# Patient Record
Sex: Female | Born: 1955 | Race: White | Hispanic: No | Marital: Married | Smoking: Former smoker
Health system: Southern US, Community
[De-identification: ages and names within clinical notes are randomized; demographics above are authoritative.]

## PROBLEM LIST (undated history)

## (undated) DIAGNOSIS — E063 Autoimmune thyroiditis: Secondary | ICD-10-CM

## (undated) DIAGNOSIS — G43909 Migraine, unspecified, not intractable, without status migrainosus: Secondary | ICD-10-CM

## (undated) DIAGNOSIS — G8929 Other chronic pain: Secondary | ICD-10-CM

## (undated) DIAGNOSIS — M542 Cervicalgia: Secondary | ICD-10-CM

## (undated) DIAGNOSIS — J80 Acute respiratory distress syndrome: Secondary | ICD-10-CM

## (undated) DIAGNOSIS — I10 Essential (primary) hypertension: Secondary | ICD-10-CM

## (undated) DIAGNOSIS — Z915 Personal history of self-harm: Secondary | ICD-10-CM

## (undated) DIAGNOSIS — K219 Gastro-esophageal reflux disease without esophagitis: Secondary | ICD-10-CM

## (undated) DIAGNOSIS — E119 Type 2 diabetes mellitus without complications: Secondary | ICD-10-CM

## (undated) DIAGNOSIS — F419 Anxiety disorder, unspecified: Secondary | ICD-10-CM

## (undated) DIAGNOSIS — Z9151 Personal history of suicidal behavior: Secondary | ICD-10-CM

## (undated) DIAGNOSIS — M797 Fibromyalgia: Secondary | ICD-10-CM

## (undated) DIAGNOSIS — E039 Hypothyroidism, unspecified: Secondary | ICD-10-CM

## (undated) DIAGNOSIS — Z8709 Personal history of other diseases of the respiratory system: Secondary | ICD-10-CM

## (undated) DIAGNOSIS — I82412 Acute embolism and thrombosis of left femoral vein: Secondary | ICD-10-CM

## (undated) DIAGNOSIS — M1990 Unspecified osteoarthritis, unspecified site: Secondary | ICD-10-CM

## (undated) DIAGNOSIS — F329 Major depressive disorder, single episode, unspecified: Secondary | ICD-10-CM

## (undated) DIAGNOSIS — F32A Depression, unspecified: Secondary | ICD-10-CM

## (undated) DIAGNOSIS — M545 Low back pain, unspecified: Secondary | ICD-10-CM

## (undated) DIAGNOSIS — G40909 Epilepsy, unspecified, not intractable, without status epilepticus: Secondary | ICD-10-CM

## (undated) DIAGNOSIS — J849 Interstitial pulmonary disease, unspecified: Secondary | ICD-10-CM

## (undated) DIAGNOSIS — J9621 Acute and chronic respiratory failure with hypoxia: Secondary | ICD-10-CM

## (undated) DIAGNOSIS — J449 Chronic obstructive pulmonary disease, unspecified: Secondary | ICD-10-CM

## (undated) DIAGNOSIS — K589 Irritable bowel syndrome without diarrhea: Secondary | ICD-10-CM

## (undated) HISTORY — DX: Other chronic pain: G89.29

## (undated) HISTORY — DX: Irritable bowel syndrome without diarrhea: K58.9

## (undated) HISTORY — DX: Autoimmune thyroiditis: E06.3

## (undated) HISTORY — DX: Migraine, unspecified, not intractable, without status migrainosus: G43.909

## (undated) HISTORY — DX: Fibromyalgia: M79.7

## (undated) HISTORY — DX: Depression, unspecified: F32.A

## (undated) HISTORY — DX: Unspecified osteoarthritis, unspecified site: M19.90

## (undated) HISTORY — PX: CHOLECYSTECTOMY: SHX55

## (undated) HISTORY — DX: Low back pain: M54.5

## (undated) HISTORY — DX: Anxiety disorder, unspecified: F41.9

## (undated) HISTORY — DX: Hypothyroidism, unspecified: E03.9

## (undated) HISTORY — DX: Major depressive disorder, single episode, unspecified: F32.9

## (undated) HISTORY — DX: Cervicalgia: M54.2

## (undated) HISTORY — PX: ABDOMINAL HYSTERECTOMY: SHX81

## (undated) HISTORY — PX: OTHER SURGICAL HISTORY: SHX169

## (undated) HISTORY — DX: Low back pain, unspecified: M54.50

## (undated) HISTORY — DX: Type 2 diabetes mellitus without complications: E11.9

---

## 1991-03-20 HISTORY — PX: VESICOVAGINAL FISTULA CLOSURE W/ TAH: SUR271

## 1997-11-28 ENCOUNTER — Emergency Department (HOSPITAL_COMMUNITY): Admission: EM | Admit: 1997-11-28 | Discharge: 1997-11-28 | Payer: Self-pay | Admitting: Emergency Medicine

## 1998-02-05 ENCOUNTER — Encounter: Payer: Self-pay | Admitting: Emergency Medicine

## 1998-02-05 ENCOUNTER — Emergency Department (HOSPITAL_COMMUNITY): Admission: EM | Admit: 1998-02-05 | Discharge: 1998-02-05 | Payer: Self-pay | Admitting: Emergency Medicine

## 1998-02-07 ENCOUNTER — Ambulatory Visit (HOSPITAL_COMMUNITY): Admission: RE | Admit: 1998-02-07 | Discharge: 1998-02-07 | Payer: Self-pay | Admitting: Psychiatry

## 1998-02-07 ENCOUNTER — Encounter: Payer: Self-pay | Admitting: Psychiatry

## 1998-02-09 ENCOUNTER — Ambulatory Visit (HOSPITAL_COMMUNITY): Admission: RE | Admit: 1998-02-09 | Discharge: 1998-02-09 | Payer: Self-pay | Admitting: *Deleted

## 2001-05-13 ENCOUNTER — Encounter: Admission: RE | Admit: 2001-05-13 | Discharge: 2001-08-11 | Payer: Self-pay | Admitting: Anesthesiology

## 2001-09-05 ENCOUNTER — Encounter: Admission: RE | Admit: 2001-09-05 | Discharge: 2001-12-04 | Payer: Self-pay

## 2002-01-07 ENCOUNTER — Inpatient Hospital Stay (HOSPITAL_COMMUNITY): Admission: RE | Admit: 2002-01-07 | Discharge: 2002-01-07 | Payer: Self-pay | Admitting: Orthopaedic Surgery

## 2002-09-14 ENCOUNTER — Encounter
Admission: RE | Admit: 2002-09-14 | Discharge: 2002-12-13 | Payer: Self-pay | Admitting: Physical Medicine & Rehabilitation

## 2002-09-15 ENCOUNTER — Encounter: Payer: Self-pay | Admitting: Physical Medicine & Rehabilitation

## 2002-09-15 ENCOUNTER — Ambulatory Visit (HOSPITAL_COMMUNITY)
Admission: RE | Admit: 2002-09-15 | Discharge: 2002-09-15 | Payer: Self-pay | Admitting: Physical Medicine & Rehabilitation

## 2002-11-04 ENCOUNTER — Emergency Department (HOSPITAL_COMMUNITY): Admission: EM | Admit: 2002-11-04 | Discharge: 2002-11-05 | Payer: Self-pay | Admitting: Emergency Medicine

## 2002-11-05 ENCOUNTER — Encounter: Payer: Self-pay | Admitting: Emergency Medicine

## 2003-02-03 ENCOUNTER — Encounter
Admission: RE | Admit: 2003-02-03 | Discharge: 2003-05-04 | Payer: Self-pay | Admitting: Physical Medicine & Rehabilitation

## 2003-02-08 ENCOUNTER — Emergency Department (HOSPITAL_COMMUNITY): Admission: EM | Admit: 2003-02-08 | Discharge: 2003-02-08 | Payer: Self-pay | Admitting: Emergency Medicine

## 2003-04-30 ENCOUNTER — Ambulatory Visit (HOSPITAL_COMMUNITY)
Admission: RE | Admit: 2003-04-30 | Discharge: 2003-04-30 | Payer: Self-pay | Admitting: Physical Medicine & Rehabilitation

## 2003-05-27 ENCOUNTER — Encounter
Admission: RE | Admit: 2003-05-27 | Discharge: 2003-08-25 | Payer: Self-pay | Admitting: Physical Medicine & Rehabilitation

## 2003-07-23 ENCOUNTER — Ambulatory Visit (HOSPITAL_COMMUNITY): Admission: RE | Admit: 2003-07-23 | Discharge: 2003-07-23 | Payer: Self-pay | Admitting: Cardiovascular Disease

## 2003-07-25 ENCOUNTER — Emergency Department (HOSPITAL_COMMUNITY): Admission: EM | Admit: 2003-07-25 | Discharge: 2003-07-26 | Payer: Self-pay | Admitting: *Deleted

## 2003-08-19 ENCOUNTER — Ambulatory Visit (HOSPITAL_COMMUNITY): Admission: RE | Admit: 2003-08-19 | Discharge: 2003-08-19 | Payer: Self-pay | Admitting: Family Medicine

## 2004-12-28 ENCOUNTER — Inpatient Hospital Stay (HOSPITAL_COMMUNITY): Admission: EM | Admit: 2004-12-28 | Discharge: 2004-12-29 | Payer: Self-pay | Admitting: Emergency Medicine

## 2004-12-28 ENCOUNTER — Encounter: Payer: Self-pay | Admitting: Cardiology

## 2005-02-15 ENCOUNTER — Emergency Department (HOSPITAL_COMMUNITY): Admission: EM | Admit: 2005-02-15 | Discharge: 2005-02-16 | Payer: Self-pay | Admitting: Emergency Medicine

## 2005-03-17 ENCOUNTER — Ambulatory Visit: Admission: RE | Admit: 2005-03-17 | Discharge: 2005-03-17 | Payer: Self-pay | Admitting: Rheumatology

## 2005-03-23 ENCOUNTER — Ambulatory Visit: Payer: Self-pay | Admitting: Pulmonary Disease

## 2005-07-18 ENCOUNTER — Encounter: Payer: Self-pay | Admitting: Emergency Medicine

## 2005-08-20 ENCOUNTER — Emergency Department (HOSPITAL_COMMUNITY): Admission: EM | Admit: 2005-08-20 | Discharge: 2005-08-21 | Payer: Self-pay | Admitting: Emergency Medicine

## 2005-09-09 ENCOUNTER — Inpatient Hospital Stay (HOSPITAL_COMMUNITY): Admission: EM | Admit: 2005-09-09 | Discharge: 2005-09-14 | Payer: Self-pay | Admitting: Emergency Medicine

## 2006-02-27 ENCOUNTER — Ambulatory Visit (HOSPITAL_COMMUNITY): Admission: RE | Admit: 2006-02-27 | Discharge: 2006-02-27 | Payer: Self-pay | Admitting: Family Medicine

## 2006-05-17 ENCOUNTER — Ambulatory Visit: Payer: Self-pay | Admitting: Internal Medicine

## 2006-05-17 DIAGNOSIS — F411 Generalized anxiety disorder: Secondary | ICD-10-CM | POA: Insufficient documentation

## 2006-05-17 DIAGNOSIS — M542 Cervicalgia: Secondary | ICD-10-CM | POA: Insufficient documentation

## 2006-05-17 DIAGNOSIS — F4323 Adjustment disorder with mixed anxiety and depressed mood: Secondary | ICD-10-CM | POA: Insufficient documentation

## 2006-05-17 DIAGNOSIS — N951 Menopausal and female climacteric states: Secondary | ICD-10-CM | POA: Insufficient documentation

## 2006-05-17 DIAGNOSIS — E890 Postprocedural hypothyroidism: Secondary | ICD-10-CM | POA: Insufficient documentation

## 2006-05-17 DIAGNOSIS — J309 Allergic rhinitis, unspecified: Secondary | ICD-10-CM | POA: Insufficient documentation

## 2006-05-17 DIAGNOSIS — M1990 Unspecified osteoarthritis, unspecified site: Secondary | ICD-10-CM | POA: Insufficient documentation

## 2006-05-17 DIAGNOSIS — E039 Hypothyroidism, unspecified: Secondary | ICD-10-CM | POA: Insufficient documentation

## 2006-05-18 ENCOUNTER — Encounter (INDEPENDENT_AMBULATORY_CARE_PROVIDER_SITE_OTHER): Payer: Self-pay | Admitting: Internal Medicine

## 2006-05-20 ENCOUNTER — Encounter (INDEPENDENT_AMBULATORY_CARE_PROVIDER_SITE_OTHER): Payer: Self-pay | Admitting: Internal Medicine

## 2006-05-21 ENCOUNTER — Encounter (INDEPENDENT_AMBULATORY_CARE_PROVIDER_SITE_OTHER): Payer: Self-pay | Admitting: Family Medicine

## 2006-05-21 ENCOUNTER — Encounter (INDEPENDENT_AMBULATORY_CARE_PROVIDER_SITE_OTHER): Payer: Self-pay | Admitting: Internal Medicine

## 2006-05-27 ENCOUNTER — Telehealth (INDEPENDENT_AMBULATORY_CARE_PROVIDER_SITE_OTHER): Payer: Self-pay | Admitting: *Deleted

## 2006-05-27 ENCOUNTER — Encounter (INDEPENDENT_AMBULATORY_CARE_PROVIDER_SITE_OTHER): Payer: Self-pay | Admitting: Internal Medicine

## 2006-06-12 ENCOUNTER — Emergency Department (HOSPITAL_COMMUNITY): Admission: EM | Admit: 2006-06-12 | Discharge: 2006-06-12 | Payer: Self-pay | Admitting: Emergency Medicine

## 2006-06-13 ENCOUNTER — Telehealth (INDEPENDENT_AMBULATORY_CARE_PROVIDER_SITE_OTHER): Payer: Self-pay | Admitting: *Deleted

## 2006-06-14 ENCOUNTER — Telehealth (INDEPENDENT_AMBULATORY_CARE_PROVIDER_SITE_OTHER): Payer: Self-pay | Admitting: *Deleted

## 2006-06-17 ENCOUNTER — Telehealth (INDEPENDENT_AMBULATORY_CARE_PROVIDER_SITE_OTHER): Payer: Self-pay | Admitting: *Deleted

## 2006-06-21 ENCOUNTER — Ambulatory Visit: Payer: Self-pay | Admitting: Internal Medicine

## 2006-06-21 DIAGNOSIS — M545 Low back pain, unspecified: Secondary | ICD-10-CM | POA: Insufficient documentation

## 2006-06-27 ENCOUNTER — Telehealth (INDEPENDENT_AMBULATORY_CARE_PROVIDER_SITE_OTHER): Payer: Self-pay | Admitting: Internal Medicine

## 2006-08-13 ENCOUNTER — Telehealth (INDEPENDENT_AMBULATORY_CARE_PROVIDER_SITE_OTHER): Payer: Self-pay | Admitting: Internal Medicine

## 2006-10-21 ENCOUNTER — Ambulatory Visit (HOSPITAL_COMMUNITY): Admission: RE | Admit: 2006-10-21 | Discharge: 2006-10-21 | Payer: Self-pay | Admitting: Anesthesiology

## 2007-01-20 ENCOUNTER — Other Ambulatory Visit: Admission: RE | Admit: 2007-01-20 | Discharge: 2007-01-20 | Payer: Self-pay | Admitting: Obstetrics and Gynecology

## 2007-02-14 ENCOUNTER — Ambulatory Visit (HOSPITAL_COMMUNITY): Admission: RE | Admit: 2007-02-14 | Discharge: 2007-02-14 | Payer: Self-pay | Admitting: Obstetrics and Gynecology

## 2007-04-11 ENCOUNTER — Ambulatory Visit (HOSPITAL_COMMUNITY): Admission: RE | Admit: 2007-04-11 | Discharge: 2007-04-11 | Payer: Self-pay | Admitting: Anesthesiology

## 2007-08-14 ENCOUNTER — Emergency Department (HOSPITAL_COMMUNITY): Admission: EM | Admit: 2007-08-14 | Discharge: 2007-08-14 | Payer: Self-pay | Admitting: Emergency Medicine

## 2009-02-18 ENCOUNTER — Encounter: Payer: Self-pay | Admitting: Physician Assistant

## 2009-08-10 ENCOUNTER — Ambulatory Visit (HOSPITAL_COMMUNITY)
Admission: RE | Admit: 2009-08-10 | Discharge: 2009-08-10 | Payer: Self-pay | Source: Home / Self Care | Admitting: Endocrinology

## 2009-10-12 ENCOUNTER — Ambulatory Visit: Payer: Self-pay | Admitting: Family Medicine

## 2009-10-12 DIAGNOSIS — K589 Irritable bowel syndrome without diarrhea: Secondary | ICD-10-CM | POA: Insufficient documentation

## 2009-10-13 ENCOUNTER — Encounter: Payer: Self-pay | Admitting: Physician Assistant

## 2009-11-10 ENCOUNTER — Telehealth: Payer: Self-pay | Admitting: Physician Assistant

## 2009-11-23 ENCOUNTER — Encounter: Payer: Self-pay | Admitting: Physician Assistant

## 2009-11-28 LAB — CONVERTED CEMR LAB
ALT: 12 units/L (ref 0–35)
BUN: 12 mg/dL (ref 6–23)
CO2: 28 meq/L (ref 19–32)
Cholesterol: 251 mg/dL — ABNORMAL HIGH (ref 0–200)
Creatinine, Ser: 0.69 mg/dL (ref 0.40–1.20)
Creatinine, Urine: 108.5 mg/dL
HDL: 50 mg/dL (ref >39)
MCV: 87.7 fL (ref 78.0–100.0)
Microalb Creat Ratio: 4.6 mg/g (ref 0.0–30.0)
Microalb, Ur: 0.5 mg/dL (ref 0.00–1.89)
RBC: 4.54 M/uL (ref 3.87–5.11)
Total Bilirubin: 0.4 mg/dL (ref 0.3–1.2)
Total CHOL/HDL Ratio: 5
Triglycerides: 548 mg/dL — ABNORMAL HIGH (ref <150)
WBC: 8.3 10*3/uL (ref 4.0–10.5)

## 2009-11-29 ENCOUNTER — Telehealth: Payer: Self-pay | Admitting: Physician Assistant

## 2009-11-30 ENCOUNTER — Ambulatory Visit (HOSPITAL_COMMUNITY): Admission: RE | Admit: 2009-11-30 | Discharge: 2009-11-30 | Payer: Self-pay | Admitting: Family Medicine

## 2010-01-11 ENCOUNTER — Encounter: Payer: Self-pay | Admitting: Physician Assistant

## 2010-01-30 ENCOUNTER — Ambulatory Visit: Payer: Self-pay | Admitting: Family Medicine

## 2010-01-30 ENCOUNTER — Inpatient Hospital Stay (HOSPITAL_COMMUNITY)
Admission: EM | Admit: 2010-01-30 | Discharge: 2010-02-04 | Payer: Self-pay | Source: Home / Self Care | Admitting: Emergency Medicine

## 2010-02-05 DIAGNOSIS — E109 Type 1 diabetes mellitus without complications: Secondary | ICD-10-CM | POA: Insufficient documentation

## 2010-02-05 DIAGNOSIS — J449 Chronic obstructive pulmonary disease, unspecified: Secondary | ICD-10-CM | POA: Insufficient documentation

## 2010-02-05 DIAGNOSIS — E785 Hyperlipidemia, unspecified: Secondary | ICD-10-CM | POA: Insufficient documentation

## 2010-02-05 DIAGNOSIS — J4489 Other specified chronic obstructive pulmonary disease: Secondary | ICD-10-CM | POA: Insufficient documentation

## 2010-02-07 ENCOUNTER — Telehealth: Payer: Self-pay | Admitting: Family Medicine

## 2010-02-07 ENCOUNTER — Ambulatory Visit: Payer: Self-pay | Admitting: Family Medicine

## 2010-02-08 ENCOUNTER — Encounter: Payer: Self-pay | Admitting: Family Medicine

## 2010-02-08 ENCOUNTER — Telehealth (INDEPENDENT_AMBULATORY_CARE_PROVIDER_SITE_OTHER): Payer: Self-pay | Admitting: *Deleted

## 2010-02-08 ENCOUNTER — Telehealth: Payer: Self-pay | Admitting: Family Medicine

## 2010-02-13 ENCOUNTER — Telehealth: Payer: Self-pay | Admitting: Family Medicine

## 2010-02-27 ENCOUNTER — Telehealth: Payer: Self-pay | Admitting: Family Medicine

## 2010-03-01 ENCOUNTER — Telehealth (INDEPENDENT_AMBULATORY_CARE_PROVIDER_SITE_OTHER): Payer: Self-pay | Admitting: *Deleted

## 2010-03-02 ENCOUNTER — Ambulatory Visit: Payer: Self-pay | Admitting: Family Medicine

## 2010-03-02 DIAGNOSIS — J209 Acute bronchitis, unspecified: Secondary | ICD-10-CM | POA: Insufficient documentation

## 2010-03-02 DIAGNOSIS — R110 Nausea: Secondary | ICD-10-CM | POA: Insufficient documentation

## 2010-03-02 LAB — CONVERTED CEMR LAB
Basophils Absolute: 0.1 10*3/uL (ref 0.0–0.1)
Calcium: 9.3 mg/dL (ref 8.4–10.5)
Creatinine, Ser: 0.68 mg/dL (ref 0.40–1.20)
Eosinophils Absolute: 0.3 10*3/uL (ref 0.0–0.7)
Eosinophils Relative: 4 % (ref 0–5)
HCT: 38.7 % (ref 36.0–46.0)
Helicobacter Pylori Antibody-IgG: <0.4
Hemoglobin: 12.8 g/dL (ref 12.0–15.0)
Hgb A1c MFr Bld: 5.7 % — ABNORMAL HIGH (ref <5.7)
MCHC: 33.1 g/dL (ref 30.0–36.0)
MCV: 87.2 fL (ref 78.0–100.0)
Monocytes Absolute: 0.5 10*3/uL (ref 0.1–1.0)
Platelets: 311 10*3/uL (ref 150–400)
RDW: 13.7 % (ref 11.5–15.5)
Sodium: 138 meq/L (ref 135–145)
TSH: 0.353 microintl units/mL (ref 0.350–4.500)

## 2010-03-03 ENCOUNTER — Telehealth: Payer: Self-pay | Admitting: Family Medicine

## 2010-03-03 ENCOUNTER — Encounter: Payer: Self-pay | Admitting: Family Medicine

## 2010-03-10 ENCOUNTER — Encounter: Payer: Self-pay | Admitting: Family Medicine

## 2010-03-28 ENCOUNTER — Encounter: Payer: Self-pay | Admitting: Family Medicine

## 2010-04-09 ENCOUNTER — Encounter: Payer: Self-pay | Admitting: Obstetrics and Gynecology

## 2010-04-09 ENCOUNTER — Encounter: Payer: Self-pay | Admitting: Family Medicine

## 2010-04-09 ENCOUNTER — Encounter: Payer: Self-pay | Admitting: Internal Medicine

## 2010-04-10 ENCOUNTER — Ambulatory Visit (HOSPITAL_COMMUNITY)
Admission: RE | Admit: 2010-04-10 | Discharge: 2010-04-10 | Payer: Self-pay | Source: Home / Self Care | Attending: Pulmonary Disease | Admitting: Pulmonary Disease

## 2010-04-16 LAB — CONVERTED CEMR LAB
Albumin: 3.9 g/dL (ref 3.5–5.2)
Albumin: 4.2 g/dL
Alkaline Phosphatase: 102 units/L (ref 39–117)
Alkaline Phosphatase: 98 units/L
BUN: 13 mg/dL
Basophils Absolute: 0 10*3/uL
Bilirubin Urine: NEGATIVE
Blood in Urine, dipstick: NEGATIVE
CO2: 26 meq/L (ref 19–32)
Calcium: 9.9 mg/dL
Creatinine, Ser: 1 mg/dL
Eosinophils Absolute: 0.2 10*3/uL
Eosinophils Absolute: 0.7 10*3/uL
Eosinophils Relative: 2 %
Free T4: 1.69 ng/dL (ref 0.89–1.80)
Glucose, Bld: 106 mg/dL
Glucose, Bld: 71 mg/dL (ref 70–99)
Glucose, Urine, Semiquant: NEGATIVE
HCT: 38.4 %
HCT: 40.3 %
Hemoglobin: 13.5 g/dL
Hgb A1c MFr Bld: 6 %
Ketones, urine, test strip: NEGATIVE
Lymphocytes Relative: 37 % (ref 12–46)
Lymphs Abs: 3.7 10*3/uL — ABNORMAL HIGH (ref 0.7–3.3)
Lymphs Abs: 4.2 10*3/uL
MCV: 30 fL
MCV: 87.3 fL
Monocytes Absolute: 0.7 10*3/uL
Monocytes Relative: 5 %
Neutrophils Relative %: 48 % (ref 43–77)
Neutrophils Relative %: 56 %
Nitrite: NEGATIVE
Platelets: 253 10*3/uL (ref 150–400)
Platelets: 283 10*3/uL
Potassium: 4.3 meq/L
Potassium: 4.4 meq/L (ref 3.5–5.3)
RBC: 4.48 M/uL
RDW: 13.1 %
Sodium: 141 meq/L (ref 135–145)
Specific Gravity, Urine: 1.025
T4, Total: 3.8 ug/dL
TSH: 0.055 microintl units/mL
Total Protein: 6.8 g/dL (ref 6.0–8.3)
WBC: 12.9 10*3/uL
WBC: 9.8 10*3/uL (ref 4.0–10.5)
pH: 6

## 2010-04-18 NOTE — Progress Notes (Signed)
Summary: nausea and vom  Phone Note Call from Patient   Summary of Call: wants to know will you call in something for her nausea and vom, sick to her stomach wants phergen [facility] Initial call taken by: Lind Guest,  February 08, 2010 11:29 AM  Follow-up for Phone Call        pls advisept med requested has been sent in Follow-up by: Syliva Overman MD,  February 08, 2010 1:02 PM  Additional Follow-up for Phone Call Additional follow up Details #1::        patient is aware Additional Follow-up by: Lind Guest,  February 08, 2010 1:08 PM    New/Updated Medications: PROMETHAZINE HCL 12.5 MG TABS (PROMETHAZINE HCL) Take 1 tablet by mouth two times a day as needed for nausea and vomitting Prescriptions: PROMETHAZINE HCL 12.5 MG TABS (PROMETHAZINE HCL) Take 1 tablet by mouth two times a day as needed for nausea and vomitting  #20 x 0   Entered and Authorized by:   Syliva Overman MD   Signed by:   Syliva Overman MD on 02/08/2010   Method used:   Electronically to        [facility]* (retail)       [address]       [place], [place]  [address]       Ph: [phone]       Fax: [phone]   RxID:   [id number]

## 2010-04-18 NOTE — Assessment & Plan Note (Signed)
Summary: office visit   Vital Signs:  Patient profile:   55 year old female Menstrual status:  hysterectomy Height:      64.5 inches Weight:      213 pounds BMI:     36.13 O2 Sat:      97 % on Room air Pulse rate:   82 / minute Pulse rhythm:   regular Resp:     16 per minute BP sitting:   108 / 70  (right arm)  Vitals Entered By: Mauricia Area CMA (January 30, 2010 3:58 PM)  Nutrition Counseling: Patient's BMI is greater than 25 and therefore counseled on weight management options.  O2 Flow:  Room air CC: Coughing, wheezing.   Primary Care Provider:  Syliva Overman MD  CC:  Coughing and wheezing.Marland Kitchen  History of Present Illness: Pt presnts with  4 day h/o progressive and disabling cough with wheeze and sputum production, fever and chills. she has been unable to sleep because of severity of her symptoms. she is fatigued, despite a neb treatment in the office she is clearly having difficuly breathing as well as speaking.   Allergies (verified): 1)  ! * Surgical Tape 2)  ! * Latex 3)  Pcn 4)  Keflex 5)  Reglan  Review of Systems      See HPI General:  Complains of chills, fatigue, fever, malaise, sleep disorder, sweats, and weakness; 4 day h/o acute illness. Eyes:  Denies red eye. Resp:  Complains of cough, sputum productive, and wheezing; 4 day h/o worsening symptoms. MS:  Complains of joint pain, low back pain, mid back pain, and stiffness. Psych:  Complains of anxiety and depression. Endo:  Denies cold intolerance, excessive hunger, excessive thirst, and excessive urination. Heme:  Denies abnormal bruising and bleeding.   Impression & Recommendations:  Problem # 1:  COPD (ICD-496) Assessment Deteriorated neb treatment and ed eval for admission  Problem # 2:  BACK PAIN, LUMBAR (ICD-724.2) Assessment: Unchanged  Her updated medication list for this problem includes:    Aspir-low 81 Mg Tbec (Aspirin) ..... One tab by mouth once daily    Hydromorphone Hcl 4  Mg Tabs (Hydromorphone hcl) ..... One tab by mouth every four hours as needed, no more than 5 a day    Fentanyl 100 Mcg/hr Pt72 (Fentanyl) ..... One patch every 2 days  Problem # 3:  HYPOTHYROIDISM (ICD-244.9) Assessment: Comment Only  Her updated medication list for this problem includes:    Levothyroxine Sodium 200 Mcg Tabs (Levothyroxine sodium) ..... One tab by mouth once daily  Labs Reviewed: TSH: 0.114 (05/17/2006)   Free T4: 1.69 (05/17/2006)    HgBA1c: 5.5 (11/23/2009) Chol: 251 (11/23/2009)   HDL: 50 (11/23/2009)   LDL: See Comment mg/dL (70/62/3762)   TG: 831 (11/23/2009)  Problem # 4:  IDDM (ICD-250.01) Assessment: Comment Only  Her updated medication list for this problem includes:    Aspir-low 81 Mg Tbec (Aspirin) ..... One tab by mouth once daily  Labs Reviewed: Creat: 0.69 (11/23/2009)    Reviewed HgBA1c results: 5.5 (11/23/2009)  6.0 (07/23/2005)  Problem # 5:  DEPRESSION (ICD-311) Assessment: Comment Only  Her updated medication list for this problem includes:    Alprazolam 1 Mg Tabs (Alprazolam) ..... One tab by mouth three times a day    Trazodone Hcl 150 Mg Tabs (Trazodone hcl) ..... One tab by mouth at bedtime    Paroxetine Hcl 10 Mg Tabs (Paroxetine hcl) ..... One tab by mouth once daily  Complete Medication List:  1)  Levothyroxine Sodium 200 Mcg Tabs (Levothyroxine sodium) .... One tab by mouth once daily 2)  Aspir-low 81 Mg Tbec (Aspirin) .... One tab by mouth once daily 3)  Alprazolam 1 Mg Tabs (Alprazolam) .... One tab by mouth three times a day 4)  Hydromorphone Hcl 4 Mg Tabs (Hydromorphone hcl) .... One tab by mouth every four hours as needed, no more than 5 a day 5)  Trazodone Hcl 150 Mg Tabs (Trazodone hcl) .... One tab by mouth at bedtime 6)  Paroxetine Hcl 10 Mg Tabs (Paroxetine hcl) .... One tab by mouth once daily 7)  Fentanyl 100 Mcg/hr Pt72 (Fentanyl) .... One patch every 2 days 8)  Polyethylene Glycol 1450 Liqd (Polyethylene glycol 1450)  .... Use 17 grams in 4-8 oz of fluid once daily 9)  Fenofibrate Micronized 134 Mg Caps (Fenofibrate micronized) .... Take 1 daily  Other Orders: Albuterol Sulfate Sol 1mg  unit dose (E4540) Ipratropium inhalation sol. unit dose (J8119) Nebulizer Tx (14782)  Patient Instructions: 1)  Please schedule a follow-up appointment in 2 weeks. 2)  You are having bronchitis/pneumionia, amnd copd flare , pls go directly to the Ed   Medication Administration  Medication # 1:    Medication: Albuterol Sulfate Sol 1mg  unit dose    Diagnosis: COUGH (ICD-786.2)    Dose: 2.5mg     Route: 08/12inhaled    Exp Date: 08/12    Lot #: [id number]    Mfr: nephron pharm    Patient tolerated medication without complications    Given by: Adella Hare LPN (January 31, 2010 8:17 AM)  Medication # 2:    Medication: Ipratropium inhalation sol. unit dose    Diagnosis: COUGH (ICD-786.2)    Dose: 0.5mg     Route: inhaled    Exp Date: 10/12    Lot #: [id number]    Mfr: nephron pharm    Patient tolerated medication without complications    Given by: Adella Hare LPN (January 31, 2010 8:18 AM)  Orders Added: 1)  Albuterol Sulfate Sol 1mg  unit dose [I6962] 2)  Ipratropium inhalation sol. unit dose [J7644] 3)  Nebulizer Tx [94640] 4)  Est. Patient Level IV [95284]     Medication Administration  Medication # 1:    Medication: Albuterol Sulfate Sol 1mg  unit dose    Diagnosis: COUGH (ICD-786.2)    Dose: 2.5mg     Route: 08/12inhaled    Exp Date: 08/12    Lot #: [id number]    Mfr: nephron pharm    Patient tolerated medication without complications    Given by: Adella Hare LPN (January 31, 2010 8:17 AM)  Medication # 2:    Medication: Ipratropium inhalation sol. unit dose    Diagnosis: COUGH (ICD-786.2)    Dose: 0.5mg     Route: inhaled    Exp Date: 10/12    Lot #: [id number]    Mfr: nephron pharm    Patient tolerated medication without complications    Given by: Adella Hare LPN (January 31, 2010 8:18  AM)  Orders Added: 1)  Albuterol Sulfate Sol 1mg  unit dose [D6644] 2)  Ipratropium inhalation sol. unit dose [J7644] 3)  Nebulizer Tx [94640] 4)  Est. Patient Level IV [03474]

## 2010-04-18 NOTE — Progress Notes (Signed)
Summary: please advise  Phone Note Other Incoming   Summary of Call: [facility]'t [facility] patient's insurance she states we could try [facility] for overnight oximetry.   Initial call taken by: Curtis Sites,  February 13, 2010 4:25 PM  Follow-up for Phone Call        ok to do overnight oximetry with company her insurance uses, pls fax the script /copy of the order script to [facility] Follow-up by: Syliva Overman MD,  February 13, 2010 5:09 PM  Additional Follow-up for Phone Call Additional follow up Details #1::        order faxed to [facility]  Additional Follow-up by: Adella Hare LPN,  February 14, 2010 5:06 PM

## 2010-04-18 NOTE — Assessment & Plan Note (Signed)
Summary: F UP FROM HOSPITAL   Vital Signs:  Patient profile:   55 year old female Menstrual status:  hysterectomy Height:      64.5 inches Weight:      217.50 pounds BMI:     36.89 O2 Sat:      97 % on Room air Pulse rate:   84 / minute Pulse rhythm:   regular Resp:     16 per minute BP sitting:   110 / 70  (left arm)  Vitals Entered By: Adella Hare LPN (February 07, 2010 1:08 PM)  Nutrition Counseling: Patient's BMI is greater than 25 and therefore counseled on weight management options.  O2 Flow:  Room air CC: hospital follow up Is Patient Diabetic? Yes Did you bring your meter with you today? No   Primary Care S[facility]alth[facility]:  Syliva Overman MD  CC:  hospital follow up.  History of Present Illness: pt reports that since her d/c she has had recurrent episodes of difficlty breathing as recently as last night and at times she feels as though her throat is closing up. hospitalised [facility] or [facility] times at [facility] with pneumonia, pt states in 2009 she spent 4 weeks in [facility] and [place], for 2 weeks, was intubated. She does admit to some anxiety surrounding her breathing. She has been in mental health for years, and repts  that her codition currently is stable and unchanged. She denies urinary symptoms. She denies productive cough or sputum, she ha no nasal congestion, sinus pressure or post nasal drainage.   Current Medications (verified): 1)  Levothyroxine Sodium 200 Mcg Tabs (Levothyroxine Sodium) .... One Tab By Mouth Once Daily 2)  Aspir-Low 81 Mg Tbec (Aspirin) .... One Tab By Mouth Once Daily 3)  Alprazolam 1 Mg Tabs (Alprazolam) .... One Tab By Mouth Three Times A Day 4)  Trazodone Hcl 150 Mg Tabs (Trazodone Hcl) .... One Tab By Mouth At Bedtime 5)  Paroxetine Hcl 10 Mg Tabs (Paroxetine Hcl) .... One Tab By Mouth Once Daily 6)  Polyethylene Glycol 1450  Liqd (Polyethylene Glycol 1450) .... Use 17 Grams in 4-8 Oz of Fluid Once Daily 7)  Fenofibrate Micronized 134 Mg Caps  (Fenofibrate Micronized) .... Take 1 Daily 8)  Flovent Hfa 44 Mcg/act Aero (Fluticasone Propionate  Hfa) .... Two Puffs Two Times A Day 9)  Humalog 100 Unit/ml Soln (Insulin Lispro (Human)) .... Per Sliding Scale Three Times A Day With Meals 10)  Lantus 100 Unit/ml Soln (Insulin Glargine) .Marland Kitchen.. 10 Units Subcutaneously At Bedtime 11)  Combivent 18-103 Mcg/act Aero (Ipratropium-Albuterol) .... Two Puffs Inhaled Three Times A Day 12)  Hydromorphone Hcl 4 Mg Tabs (Hydromorphone Hcl) .Marland Kitchen.. 1-2 Tabs By Mouth Every 6 Hours As Needed 13)  Ms Contin 100 Mg Xr12h-Tab (Morphine Sulfate) .... One Tab By Mouth Every Eight Hours  Allergies (verified): 1)  ! * Surgical Tape 2)  ! * Latex 3)  Pcn 4)  Keflex 5)  Reglan  Review of Systems      See HPI General:  Complains of fatigue. Eyes:  Denies blurring, discharge, eye pain, and red eye. CV:  Denies palpitations and swelling of feet. GI:  Denies abdominal pain, constipation, diarrhea, nausea, and vomiting. GU:  Denies dysuria and urinary frequency. MS:  Complains of low back pain and mid back pain. Derm:  Denies itching and rash. Neuro:  Denies headaches and seizures. Psych:  Complains of anxiety, depression, and mental problems; denies suicidal thoughts/plans, thoughts of violence, and unusual visions  or sounds. Endo:  Denies excessive thirst and excessive urination. Heme:  Denies abnormal bruising and bleeding. Allergy:  Denies hives or rash and itching eyes.  Physical Exam  General:  Well-developed,obese,in no acute distress; alert,appropriate and cooperative throughout examination HEENT: No facial asymmetry,  EOMI, No sinus tenderness, TM's Clear, oropharynx  pink and moist.   Chest: decreased air entry few wheezes no crackles CVS: S1, S2, No murmurs, No S3.   Abd: Soft, Nontender.  MS: decreased  ROM spine,adequate in hips, shoulders and knees.  Ext: No edema.   CNS: CN 2-12 intact, power tone and sensation normal throughout.   Skin:  Intact, no visible lesions or rashes.  Psych: Good eye contact, normal affect.  Memory intact,  anxious oppearing.    Impression & Recommendations:  Problem # 1:  COPD (ICD-496) Assessment Deteriorated  Her updated medication list for this problem includes:    Flovent Hfa 44 Mcg/act Aero (Fluticasone propionate  hfa) .Marland Kitchen..Marland Kitchen Two puffs two times a day    Combivent 18-103 Mcg/act Aero (Ipratropium-albuterol) .Marland Kitchen..Marland Kitchen Two puffs inhaled three times a day    Spiriva Handihaler 18 Mcg Caps (Tiotropium bromide monohydrate) ..... One inhalation once daily    Symbicort 80-4.5 Mcg/act Aero (Budesonide-formoterol fumarate) .Marland Kitchen... 2 puffs twice daily  Orders: Pulmonary Referral (Pulmonary)  Problem # 2:  IDDM (ICD-250.01) Assessment: Comment Only  Her updated medication list for this problem includes:    Aspir-low 81 Mg Tbec (Aspirin) ..... One tab by mouth once daily    Humalog 100 Unit/ml Soln (Insulin lispro (human)) .Marland Kitchen... Per sliding scale three times a day with meals    Lantus 100 Unit/ml Soln (Insulin glargine) .Marland KitchenMarland KitchenMarland KitchenMarland Kitchen 10 units subcutaneously at bedtime  Orders: T- Hemoglobin A1C ([address]-60[address])  Labs Reviewed: Creat: 0.69 (11/23/2009)    Reviewed HgBA1c results: 5.5 (11/23/2009)  6.0 (07/23/2005)  Problem # 3:  BACK PAIN, LUMBAR (ICD-724.2) Assessment: Unchanged  The following medications were removed from the medication list:    Hydromorphone Hcl 4 Mg Tabs (Hydromorphone hcl) ..... One tab by mouth every four hours as needed, no more than 5 a day    Fentanyl 100 Mcg/hr Pt72 (Fentanyl) ..... One patch every 2 days Her updated medication list for this problem includes:    Aspir-low 81 Mg Tbec (Aspirin) ..... One tab by mouth once daily    Hydromorphone Hcl 4 Mg Tabs (Hydromorphone hcl) .Marland Kitchen... 1-2 tabs by mouth every 6 hours as needed    Ms Contin 100 Mg Xr12h-tab (Morphine sulfate) ..... One tab by mouth every eight hours treated hrough pain clinic  Problem # 4:  DEPRESSION  (ICD-311) Assessment: Unchanged  Her updated medication list for this problem includes:    Alprazolam 1 Mg Tabs (Alprazolam) ..... One tab by mouth three times a day    Trazodone Hcl 150 Mg Tabs (Trazodone hcl) ..... One tab by mouth at bedtime    Paroxetine Hcl 10 Mg Tabs (Paroxetine hcl) ..... One tab by mouth once daily followed by mentalhealth for yrs  Problem # 5:  HYPOTHYROIDISM (ICD-244.9) Assessment: Comment Only  Her updated medication list for this problem includes:    Levothyroxine Sodium 200 Mcg Tabs (Levothyroxine sodium) ..... One tab by mouth once daily  Labs Reviewed: TSH: 0.114 (05/17/2006)   Free T4: 1.69 (05/17/2006)  , recently hospitlalised will track down updated values HgBA1c: 5.5 (11/23/2009) Chol: 251 (11/23/2009)   HDL: 50 (11/23/2009)   LDL: See Comment mg/dL (09/81/1914)   TG: 782 (11/23/2009)  Complete Medication List:  1)  Levothyroxine Sodium 200 Mcg Tabs (Levothyroxine sodium) .... One tab by mouth once daily 2)  Aspir-low 81 Mg Tbec (Aspirin) .... One tab by mouth once daily 3)  Alprazolam 1 Mg Tabs (Alprazolam) .... One tab by mouth three times a day 4)  Trazodone Hcl 150 Mg Tabs (Trazodone hcl) .... One tab by mouth at bedtime 5)  Paroxetine Hcl 10 Mg Tabs (Paroxetine hcl) .... One tab by mouth once daily 6)  Polyethylene Glycol 1450 Liqd (Polyethylene glycol 1450) .... Use 17 grams in 4-8 oz of fluid once daily 7)  Fenofibrate Micronized 134 Mg Caps (Fenofibrate micronized) .... Take 1 daily 8)  Flovent Hfa 44 Mcg/act Aero (Fluticasone propionate  hfa) .... Two puffs two times a day 9)  Humalog 100 Unit/ml Soln (Insulin lispro (human)) .... Per sliding scale three times a day with meals 10)  Lantus 100 Unit/ml Soln (Insulin glargine) .Marland Kitchen.. 10 units subcutaneously at bedtime 11)  Combivent 18-103 Mcg/act Aero (Ipratropium-albuterol) .... Two puffs inhaled three times a day 12)  Hydromorphone Hcl 4 Mg Tabs (Hydromorphone hcl) .Marland Kitchen.. 1-2 tabs by mouth  every 6 hours as needed 13)  Ms Contin 100 Mg Xr12h-tab (Morphine sulfate) .... One tab by mouth every eight hours 14)  Spiriva Handihaler 18 Mcg Caps (Tiotropium bromide monohydrate) .... One inhalation once daily 15)  Symbicort 80-4.5 Mcg/act Aero (Budesonide-formoterol fumarate) .... 2 puffs twice daily 16)  Fluconazole 150 Mg Tabs (Fluconazole) .... Take 1 tablet by mouth once a day as needed for vaginal itch 17)  Promethazine Hcl 12.5 Mg Tabs (Promethazine hcl) .... Take 1 tablet by mouth two times a day as needed for nausea and vomitting  Other Orders: T-TSH ([id number]) T-T3, Free [phone]) T-T4, Free [phone])  Patient Instructions: 1)  Please schedule a follow-up appointment in 3 months. 2)  you will be referred to Dr Juanetta Gosling. 3)  new inhalers to help with breathing for daily use, we will provide symbicort sample and coupon. 4)  you will be referredfor over night pulse oximetry to see ifyou need oxygen when asleep. 5)  tsH , free T3and T4, also HBA1C in 3 months Prescriptions: SYMBICORT 80-4.5 MCG/ACT AERO (BUDESONIDE-FORMOTEROL FUMARATE) 2 puffs twice daily  #1 x 3   Entered and Authorized by:   Syliva Overman MD   Signed by:   Syliva Overman MD on 02/07/2010   Method used:   Electronically to        [facility]* (retail)       [address]       [place], [place]  [address]       Ph: [phone]       Fax: [phone]   RxID:   [id number] SPIRIVA HANDIHALER 18 MCG CAPS (TIOTROPIUM BROMIDE MONOHYDRATE) one inhalation once daily  #30 caps x 11   Entered and Authorized by:   Syliva Overman MD   Signed by:   Syliva Overman MD on 02/07/2010   Method used:   Electronically to        [facility]* (retail)       [address]       [place], [place]  [address]       Ph: [phone]       Fax: [phone]   RxID:   [id number]    Orders Added: 1)  Est. Patient Level IV [81829] 2)  T-TSH [93716-96789] 3)  T-T3,  Free [04540-98119] 4)  T-T4, Free [14782-95621] 5)  T- Hemoglobin A1C [83036-23375] 6)  Pulmonary Referral [Pulmonary]

## 2010-04-18 NOTE — Progress Notes (Signed)
Summary: med  Phone Note Call from Patient   Summary of Call: pt went to get med but they could afford the generic brand.  can you please call her. [phone] Initial call taken by: Rudene Anda,  November 29, 2009 2:51 PM  Follow-up for Phone Call        patient states she cannot afford the tricor, even generic is too high Follow-up by: Adella Hare LPN,  November 29, 2009 3:24 PM  Additional Follow-up for Phone Call Additional follow up Details #1::        Doesnt she have prescription coverage?  What is her copay for generic meds? Additional Follow-up by: Esperanza Sheets PA,  November 29, 2009 3:30 PM    Additional Follow-up for Phone Call Additional follow up Details #2::    returned call, left message Follow-up by: Adella Hare LPN,  November 30, 2009 9:08 AM  Additional Follow-up for Phone Call Additional follow up Details #3:: Details for Additional Follow-up Action Taken: please send in the similar drug that is generic Additional Follow-up by: Adella Hare LPN,  November 30, 2009 10:35 AM  New/Updated Medications: FENOFIBRATE MICRONIZED 134 MG CAPS (FENOFIBRATE MICRONIZED) take 1 daily Prescriptions: FENOFIBRATE MICRONIZED 134 MG CAPS (FENOFIBRATE MICRONIZED) take 1 daily  #30 x 3   Entered and Authorized by:   Esperanza Sheets PA   Signed by:   Esperanza Sheets PA on 11/30/2009   Method used:   Electronically to        [facility]* (retail)       [address]       [place], [place]  [address]       Ph: [phone]       Fax: [phone]   RxID:   [id number]

## 2010-04-18 NOTE — Letter (Signed)
Summary: AUTHORIZATION FOR MEDICAL RELEASE  AUTHORIZATION FOR MEDICAL RELEASE   Imported By: Lind Guest 10/13/2009 16:46:52  _____________________________________________________________________  External Attachment:    Type:   Image     Comment:   External Document

## 2010-04-18 NOTE — Progress Notes (Signed)
  Phone Note Outgoing Call   Summary of Call: Notify pt that I have received and reviewed her records from Dr Olena Leatherwood.  I would like her to get the labs drawn that I ordered last mos so that we can address her diabetes. Initial call taken by: Esperanza Sheets PA,  November 10, 2009 9:41 AM  Follow-up for Phone Call        called patient, left message  Follow-up by: Everitt Amber LPN,  November 10, 2009 10:05 AM  Additional Follow-up for Phone Call Additional follow up Details #1::        Patient aware and will have done Additional Follow-up by: Everitt Amber LPN,  November 11, 2009 3:51 PM
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
## 2010-04-18 NOTE — Miscellaneous (Signed)
Summary: REFUSED HOME CARE  REFUSED HOME CARE   Imported By: Lind Guest 02/07/2010 08:32:11  _____________________________________________________________________  External Attachment:    Type:   Image     Comment:   External Document

## 2010-04-18 NOTE — Progress Notes (Signed)
Summary: resend rx  Phone Note Call from Patient   Summary of Call: please send to rx [facility] thank you Initial call taken by: Lind Guest,  February 08, 2010 1:30 PM  Follow-up for Phone Call        pls print and faxt to pharmacy requested, phenergan Follow-up by: Syliva Overman MD,  February 08, 2010 2:14 PM    Prescriptions: PROMETHAZINE HCL 12.5 MG TABS (PROMETHAZINE HCL) Take 1 tablet by mouth two times a day as needed for nausea and vomitting  #20 x 0   Entered by:   Adella Hare LPN   Authorized by:   Syliva Overman MD   Signed by:   Adella Hare LPN on 16/12/9602   Method used:   Electronically to        [facility]* (retail)       [address]       [place], [place]  [address]       Ph: [phone]       Fax: [phone]   RxID:   [id number]

## 2010-04-18 NOTE — Letter (Signed)
Summary: overnight oximetry  overnight oximetry   Imported By: Lind Guest 02/08/2010 17:32:53  _____________________________________________________________________  External Attachment:    Type:   Image     Comment:   External Document

## 2010-04-18 NOTE — Letter (Signed)
Summary: medical release info  medical release info   Imported By: Lind Guest 02/08/2010 17:24:58  _____________________________________________________________________  External Attachment:    Type:   Image     Comment:   External Document

## 2010-04-18 NOTE — Progress Notes (Signed)
  Phone Note Call from Patient   Caller: Patient Summary of Call: forgot to tell dr simpson she has yeast infection from the antibiotics, please send somthing in Initial call taken by: Adella Hare LPN,  February 07, 2010 2:05 PM  Follow-up for Phone Call        pls let pt know med requested sent in Follow-up by: Syliva Overman MD,  February 07, 2010 2:47 PM  Additional Follow-up for Phone Call Additional follow up Details #1::        left messaGE Additional Follow-up by: Lind Guest,  February 07, 2010 4:26 PM    New/Updated Medications: FLUCONAZOLE 150 MG TABS (FLUCONAZOLE) Take 1 tablet by mouth once a day as needed for vaginal itch Prescriptions: FLUCONAZOLE 150 MG TABS (FLUCONAZOLE) Take 1 tablet by mouth once a day as needed for vaginal itch  #3 x 0   Entered and Authorized by:   Syliva Overman MD   Signed by:   Syliva Overman MD on 02/07/2010   Method used:   Electronically to        [facility]* (retail)       [address]       [place], [place]  [address]       Ph: [phone]       Fax: [phone]   RxID:   [id number]

## 2010-04-18 NOTE — Procedures (Signed)
Summary: Gastroenterology / letter  Gastroenterology / letter   Imported By: Lind Guest 01/11/2010 14:38:52  _____________________________________________________________________  External Attachment:    Type:   Image     Comment:   External Document

## 2010-04-20 NOTE — Assessment & Plan Note (Signed)
Summary: ov   Vital Signs:  Patient profile:   55 year old female Menstrual status:  hysterectomy Height:      64.5 inches Weight:      210.25 pounds O2 Sat:      98 % on Room air Pulse rate:   92 / minute Pulse rhythm:   regular Resp:     16 per minute BP sitting:   110 / 70  (right arm)  Vitals Entered By: Adella Hare LPN (March 02, 2010 3:13 PM)  O2 Flow:  Room air CC: follow-up visit- still sick Is Patient Diabetic? Yes Did you bring your meter with you today? No   Primary Care Kyndall Chaplin:  Syliva Overman MD  CC:  follow-up visit- still sick.  History of Present Illness: 1 week h/o increase4d sOB , wheeze thick grey sputum, chills, and no fever, bones aching. Passed out 3 daYS AGO. She also c/o increased abdominal pain with uncontrolled nausea and vomitting.has been to gI in the remote past want re-eval.   Current Medications (verified): 1)  Levothyroxine Sodium 200 Mcg Tabs (Levothyroxine Sodium) .... One Tab By Mouth Once Daily 2)  Aspir-Low 81 Mg Tbec (Aspirin) .... One Tab By Mouth Once Daily 3)  Alprazolam 1 Mg Tabs (Alprazolam) .... One Tab By Mouth Three Times A Day 4)  Trazodone Hcl 150 Mg Tabs (Trazodone Hcl) .... One Tab By Mouth At Bedtime 5)  Polyethylene Glycol 1450  Liqd (Polyethylene Glycol 1450) .... Use 17 Grams in 4-8 Oz of Fluid Once Daily 6)  Fenofibrate Micronized 134 Mg Caps (Fenofibrate Micronized) .... Take 1 Daily 7)  Flovent Hfa 44 Mcg/act Aero (Fluticasone Propionate  Hfa) .... Two Puffs Two Times A Day 8)  Humalog 100 Unit/ml Soln (Insulin Lispro (Human)) .... Per Sliding Scale Three Times A Day With Meals 9)  Lantus 100 Unit/ml Soln (Insulin Glargine) .Marland Kitchen.. 10 Units Subcutaneously At Bedtime 10)  Combivent 18-103 Mcg/act Aero (Ipratropium-Albuterol) .... Two Puffs Inhaled Three Times A Day 11)  Hydromorphone Hcl 4 Mg Tabs (Hydromorphone Hcl) .Marland Kitchen.. 1-2 Tabs By Mouth Every 6 Hours As Needed 12)  Ms Contin 100 Mg Xr12h-Tab (Morphine Sulfate)  .... One Tab By Mouth Every 12  Hours 13)  Spiriva Handihaler 18 Mcg Caps (Tiotropium Bromide Monohydrate) .... One Inhalation Once Daily 14)  Symbicort 80-4.5 Mcg/act Aero (Budesonide-Formoterol Fumarate) .... 2 Puffs Twice Daily 15)  Paroxetine Hcl 20 Mg Tabs (Paroxetine Hcl) .... One Tab By Mouth Once Daily  Allergies (verified): 1)  ! * Surgical Tape 2)  ! * Latex 3)  Pcn 4)  Keflex 5)  Reglan  Review of Systems      See HPI General:  Complains of chills and fatigue. Eyes:  Denies blurring and discharge. CV:  Denies chest pain or discomfort, palpitations, and swelling of feet. Resp:  Complains of cough, shortness of breath, sputum productive, and wheezing. GI:  Complains of loss of appetite, nausea, and vomiting. GU:  Denies dysuria and urinary frequency. MS:  Complains of joint pain, low back pain, and mid back pain. Neuro:  Complains of headaches; denies seizures and sensation of room spinning; aggravated by cough. Psych:  Complains of anxiety and depression; denies mental problems, suicidal thoughts/plans, thoughts of violence, unusual visions or sounds, and thoughts /plans of harming others; controlled on meds. Endo:  Denies cold intolerance, excessive hunger, excessive thirst, excessive urination, and heat intolerance. Heme:  Denies abnormal bruising and bleeding. Allergy:  Denies hives or rash, itching eyes, persistent infections, and  seasonal allergies.  Physical Exam  General:  Well-developed,obese,in mild respiratoiory distress; alert,appropriate and cooperative throughout examination. Anxious.  HEENT: No facial asymmetry,  EOMI, No sinus tenderness, TM's Clear, oropharynx  pink and moist.   Chest: decreased air entry,bilateral wheezes, few crackles CVS: S1, S2, No murmurs, No S3.   Abd: Soft, diffuse superficial tenderness, no guarding or rebound  MS: decreased  ROM spine, hips, shoulders and knees. Ambulates with a cane Ext: No edema.   CNS: CN 2-12 intact, power  tone and sensation normal throughout.   Skin: Intact, no visible lesions or rashes.  Psych: Good eye contact, normal affect.  Memory intact, not anxious or depressed appearing.    Impression & Recommendations:  Problem # 1:  ACUTE BRONCHITIS (ICD-466.0) Assessment Comment Only  Her updated medication list for this problem includes:    Flovent Hfa 44 Mcg/act Aero (Fluticasone propionate  hfa) .Marland Kitchen..Marland Kitchen Two puffs two times a day    Combivent 18-103 Mcg/act Aero (Ipratropium-albuterol) .Marland Kitchen..Marland Kitchen Two puffs inhaled three times a day    Spiriva Handihaler 18 Mcg Caps (Tiotropium bromide monohydrate) ..... One inhalation once daily    Symbicort 80-4.5 Mcg/act Aero (Budesonide-formoterol fumarate) .Marland Kitchen... 2 puffs twice daily    Doxycycline Hyclate 100 Mg Caps (Doxycycline hyclate) .Marland Kitchen... Take 1 capsule by mouth two times a day    Tessalon Perles 100 Mg Caps (Benzonatate) .Marland Kitchen... Take 1 capsule by mouth three times a day  Orders: Medicare Electronic Prescription [phone] T-CBC w/Diff ([id number]) T-Culture, Sputum & Gram Stain (87070/[id number]) Albuterol Sulfate Sol 1mg  unit dose (B1478) Ipratropium inhalation sol. unit dose (G9562) Nebulizer Tx (13086)  Problem # 2:  NAUSEA AND VOMITING (ICD-787.01) Assessment: Deteriorated  Orders: T-Basic Metabolic Panel [phone]) Gastroenterology Referral (GI) Zofran 1mg . injection (M8413) Admin of Therapeutic Inj  intramuscular or subcutaneous (24401)  Problem # 3:  COPD (ICD-496) Assessment: Deteriorated  Her updated medication list for this problem includes:    Flovent Hfa 44 Mcg/act Aero (Fluticasone propionate  hfa) .Marland Kitchen..Marland Kitchen Two puffs two times a day    Combivent 18-103 Mcg/act Aero (Ipratropium-albuterol) .Marland Kitchen..Marland Kitchen Two puffs inhaled three times a day    Spiriva Handihaler 18 Mcg Caps (Tiotropium bromide monohydrate) ..... One inhalation once daily    Symbicort 80-4.5 Mcg/act Aero (Budesonide-formoterol fumarate) .Marland Kitchen... 2 puffs twice daily qualifies for  nocturnaloxygen will start this today  Problem # 4:  HYPOTHYROIDISM (ICD-244.9) Assessment: Comment Only  Her updated medication list for this problem includes:    Levothyroxine Sodium 200 Mcg Tabs (Levothyroxine sodium) ..... One tab by mouth once daily  Labs Reviewed: TSH: 0.114 (05/17/2006)   Free T4: 1.69 (05/17/2006) , updated labs to be checked  HgBA1c: 5.5 (11/23/2009) Chol: 251 (11/23/2009)   HDL: 50 (11/23/2009)   LDL: See Comment mg/dL (02/72/5366)   TG: 440 (11/23/2009)  Problem # 5:  DEPRESSION (ICD-311) Assessment: Unchanged  The following medications were removed from the medication list:    Paroxetine Hcl 10 Mg Tabs (Paroxetine hcl) ..... One tab by mouth once daily Her updated medication list for this problem includes:    Alprazolam 1 Mg Tabs (Alprazolam) ..... One tab by mouth three times a day    Trazodone Hcl 150 Mg Tabs (Trazodone hcl) ..... One tab by mouth at bedtime    Paroxetine Hcl 20 Mg Tabs (Paroxetine hcl) ..... One tab by mouth once daily  Complete Medication List: 1)  Levothyroxine Sodium 200 Mcg Tabs (Levothyroxine sodium) .... One tab by mouth once daily 2)  Aspir-low 81 Mg Tbec (  Aspirin) .... One tab by mouth once daily 3)  Alprazolam 1 Mg Tabs (Alprazolam) .... One tab by mouth three times a day 4)  Trazodone Hcl 150 Mg Tabs (Trazodone hcl) .... One tab by mouth at bedtime 5)  Polyethylene Glycol 1450 Liqd (Polyethylene glycol 1450) .... Use 17 grams in 4-8 oz of fluid once daily 6)  Fenofibrate Micronized 134 Mg Caps (Fenofibrate micronized) .... Take 1 daily 7)  Flovent Hfa 44 Mcg/act Aero (Fluticasone propionate  hfa) .... Two puffs two times a day 8)  Humalog 100 Unit/ml Soln (Insulin lispro (human)) .... Per sliding scale three times a day with meals 9)  Lantus 100 Unit/ml Soln (Insulin glargine) .Marland Kitchen.. 10 units subcutaneously at bedtime 10)  Combivent 18-103 Mcg/act Aero (Ipratropium-albuterol) .... Two puffs inhaled three times a day 11)   Hydromorphone Hcl 4 Mg Tabs (Hydromorphone hcl) .Marland Kitchen.. 1-2 tabs by mouth every 6 hours as needed 12)  Ms Contin 100 Mg Xr12h-tab (Morphine sulfate) .... One tab by mouth every 12  hours 13)  Spiriva Handihaler 18 Mcg Caps (Tiotropium bromide monohydrate) .... One inhalation once daily 14)  Symbicort 80-4.5 Mcg/act Aero (Budesonide-formoterol fumarate) .... 2 puffs twice daily 15)  Paroxetine Hcl 20 Mg Tabs (Paroxetine hcl) .... One tab by mouth once daily 16)  Doxycycline Hyclate 100 Mg Caps (Doxycycline hyclate) .... Take 1 capsule by mouth two times a day 17)  Tessalon Perles 100 Mg Caps (Benzonatate) .... Take 1 capsule by mouth three times a day  Other Orders: TLB-H. Pylori Abs(Helicobacter Pylori) (86677-HELICO)  Patient Instructions: 1)  f/u IN 3 TO 3.5 MONTHS 2)  Pls keep appt with DrHawkins this is vital 3)  You have bronchitis and asthma flare, meds are sent in for this  4)  Neb treatment in the office 5)  You do qualify for supplemental oxygen at night , and an order will be sent in. 6)  Pls send sputum for c/s  7)  BMP prior to visit, ICD-9: 8)  CBC w/ Diff prior to visit, ICD-9:  pls add to tests today 9)  Hpylori and chem 7 today. 10)  Zofran today for nausea Prescriptions: TESSALON PERLES 100 MG CAPS (BENZONATATE) Take 1 capsule by mouth three times a day  #30 x 0   Entered and Authorized by:   Syliva Overman MD   Signed by:   Syliva Overman MD on 03/02/2010   Method used:   Electronically to        [facility]* (retail)       [address]       [place], [place]  [address]       Ph: [phone]       Fax: [phone]   RxID:   [id number] DOXYCYCLINE HYCLATE 100 MG CAPS (DOXYCYCLINE HYCLATE) Take 1 capsule by mouth two times a day  #20 x 0   Entered and Authorized by:   Syliva Overman MD   Signed by:   Syliva Overman MD on 03/02/2010   Method used:   Electronically to        [facility]* (retail)       [address]        [place], [place]  [address]       Ph: [phone]       Fax: [phone]   RxID:   [id number]    Medication Administration  Injection # 1:  Medication: Zofran 1mg . injection    Diagnosis: NAUSEA AND VOMITING (ICD-787.01)    Route: IM    Site: RUOQ gluteus    Exp Date: 04/13    Lot #: [id number]    Mfr:  NOVAPLUS    Comments: ZOFRAN 4MG  GIVEN    Patient tolerated injection without complications    Given by: Adella Hare LPN (March 02, 2010 4:59 PM)  Medication # 1:    Medication: Albuterol Sulfate Sol 1mg  unit dose    Diagnosis: ACUTE BRONCHITIS (ICD-466.0)    Dose: 2.5MG     Route: inhaled    Exp Date: 08/12    Lot #: [id number]    Mfr: NEPHRON PHARM    Patient tolerated medication without complications    Given by: Adella Hare LPN (March 02, 2010 5:00 PM)  Medication # 2:    Medication: Ipratropium inhalation sol. unit dose    Diagnosis: ACUTE BRONCHITIS (ICD-466.0)    Dose: 0.5MG     Route: inhaled    Exp Date: 10/12    Lot #: [id number]    Mfr: NEPHRON PHARM    Patient tolerated medication without complications    Given by: Adella Hare LPN (March 02, 2010 5:00 PM)  Orders Added: 1)  Est. Patient Level IV [99214] 2)  Medicare Electronic Prescription [G8553] 3)  T-Basic Metabolic Panel [80048-22910] 4)  T-CBC w/Diff [09326-71245] 5)  TLB-H. Pylori Abs(Helicobacter Pylori) [86677-HELICO] 6)  T-Culture, Sputum & Gram Stain [87070/87205-70030] 7)  Gastroenterology Referral [GI] 8)  Zofran 1mg . injection [J2405] 9)  Admin of Therapeutic Inj  intramuscular or subcutaneous [96372] 10)  Albuterol Sulfate Sol 1mg  unit dose [J7613] 11)  Ipratropium inhalation sol. unit dose [J7644] 12)  Nebulizer Tx [94640]     Medication Administration  Injection # 1:    Medication: Zofran 1mg . injection    Diagnosis: NAUSEA AND VOMITING (ICD-787.01)    Route: IM    Site: RUOQ gluteus    Exp Date: 04/13    Lot #: [id number]    Mfr:  NOVAPLUS    Comments:  ZOFRAN 4MG  GIVEN    Patient tolerated injection without complications    Given by: Adella Hare LPN (March 02, 2010 4:59 PM)  Medication # 1:    Medication: Albuterol Sulfate Sol 1mg  unit dose    Diagnosis: ACUTE BRONCHITIS (ICD-466.0)    Dose: 2.5MG     Route: inhaled    Exp Date: 08/12    Lot #: [id number]    Mfr: NEPHRON PHARM    Patient tolerated medication without complications    Given by: Adella Hare LPN (March 02, 2010 5:00 PM)  Medication # 2:    Medication: Ipratropium inhalation sol. unit dose    Diagnosis: ACUTE BRONCHITIS (ICD-466.0)    Dose: 0.5MG     Route: inhaled    Exp Date: 10/12    Lot #: [id number]    Mfr: NEPHRON PHARM    Patient tolerated medication without complications    Given by: Adella Hare LPN (March 02, 2010 5:00 PM)  Orders Added: 1)  Est. Patient Level IV [99214] 2)  Medicare Electronic Prescription [G8553] 3)  T-Basic Metabolic Panel [80048-22910] 4)  T-CBC w/Diff [39767-34193] 5)  TLB-H. Pylori Abs(Helicobacter Pylori) [86677-HELICO] 6)  T-Culture, Sputum & Gram Stain [87070/87205-70030] 7)  Gastroenterology Referral [GI] 8)  Zofran 1mg . injection [J2405] 9)  Admin of Therapeutic Inj  intramuscular or subcutaneous [96372] 10)  Albuterol Sulfate Sol 1mg  unit dose [J7613] 11)  Ipratropium inhalation sol. unit dose [X9024] 12)  Nebulizer  Tx [16109]

## 2010-04-20 NOTE — Progress Notes (Signed)
Summary: MEDICATION  Phone Note Call from Patient   Summary of Call: PATIENT NEED A REFILL ON LEVOTHYROXING 200 MCG AT [facility] [place] Initial call taken by: Eugenio Hoes,  February 27, 2010 11:17 AM  Follow-up for Phone Call        pls refill x 1 , needs ov Follow-up by: Syliva Overman MD,  February 28, 2010 6:25 AM    Prescriptions: LEVOTHYROXINE SODIUM 200 MCG TABS (LEVOTHYROXINE SODIUM) one tab by mouth once daily  #30 x 0   Entered by:   Adella Hare LPN   Authorized by:   Syliva Overman MD   Signed by:   Adella Hare LPN on 16/12/9602   Method used:   Electronically to        [facility]* (retail)       [address]       [place], [place]  [address]       Ph: [phone]       Fax: [phone]   RxID:   [id number]   Appended Document: MEDICATION Prescriptions: LEVOTHYROXINE SODIUM 200 MCG TABS (LEVOTHYROXINE SODIUM) one tab by mouth once daily  #30 x 0   Entered by:   Adella Hare LPN   Authorized by:   Syliva Overman MD   Signed by:   Adella Hare LPN on 01/13/2535   Method used:   Electronically to        [facility]* (retail)       [address]       [place], [place]  [address]       Ph: [phone]       Fax: [phone]   RxID:   [id number]

## 2010-04-20 NOTE — Progress Notes (Signed)
Summary: MEDICATION SENT TO [facility]   Phone Note Call from Patient   Summary of Call: PATIENT CALLED STATES THAT SHE HAS NOT RECVD MEDICATION PLEASE DO NOT SEND TO [facility]..SHE DO NOT GET HER RX FROM THERE PLEASE SEND RX TO [facility] TESSALON PERLES 100 MG, DOXYCYCLINE HYCLATE 100 MG AND SHE ALSO NEEDS SOMETHING FOR NAUSEA. Initial call taken by: Eugenio Hoes,  March 03, 2010 9:19 AM  Follow-up for Phone Call        nausea med? Follow-up by: Adella Hare LPN,  March 03, 2010 4:09 PM  Additional Follow-up for Phone Call Additional follow up Details #1::        phenergan entered historically pls fax to the pharmacy she wants also , pls let her know thanks Additional Follow-up by: Syliva Overman MD,  March 03, 2010 4:25 PM    New/Updated Medications: PROMETHAZINE HCL 25 MG TABS (PROMETHAZINE HCL) Take 1 tablet by mouth once a day as needed for nausea Prescriptions: PROMETHAZINE HCL 25 MG TABS (PROMETHAZINE HCL) Take 1 tablet by mouth once a day as needed for nausea  #30 x 1   Entered by:   Adella Hare LPN   Authorized by:   Syliva Overman MD   Signed by:   Adella Hare LPN on 16/12/9602   Method used:   Electronically to        [facility]* (retail)       [address]       [place], [place]  [address]       Ph: [phone]       Fax: [phone]   RxID:   [id number] PROMETHAZINE HCL 25 MG TABS (PROMETHAZINE HCL) Take 1 tablet by mouth once a day as needed for nausea  #30 x 1   Entered and Authorized by:   Syliva Overman MD   Signed by:   Syliva Overman MD on 03/03/2010   Method used:   Historical   RxID:   [id number] TESSALON PERLES 100 MG CAPS (BENZONATATE) Take 1 capsule by mouth three times a day  #30 x 0   Entered by:   Adella Hare LPN   Authorized by:   Syliva Overman MD   Signed by:   Adella Hare LPN on 25/95/6387   Method used:   Electronically to        [facility]*  (retail)       [address]       [place], [place]  [address]       Ph: [phone]       Fax: [phone]   RxID:   [id number] DOXYCYCLINE HYCLATE 100 MG CAPS (DOXYCYCLINE HYCLATE) Take 1 capsule by mouth two times a day  #20 x 0   Entered by:   Adella Hare LPN   Authorized by:   Syliva Overman MD   Signed by:   Adella Hare LPN on 62/37/6283   Method used:   Electronically to        [facility]* (retail)       [address]       [place], [place]  [address]       Ph: [phone]       Fax: [phone]   RxID:   [id number] LEVOTHYROXINE SODIUM 200 MCG TABS (LEVOTHYROXINE SODIUM) one tab by mouth  once daily  #30 x 0   Entered by:   Adella Hare LPN   Authorized by:   Syliva Overman MD   Signed by:   Adella Hare LPN on 40/98/1191   Method used:   Electronically to        [facility]* (retail)       [address]       [place], [place]  [address]       Ph: [phone]       Fax: [phone]   RxID:   [id number]

## 2010-04-20 NOTE — Miscellaneous (Signed)
Summary: [facility]   Imported By: Lind Guest 03/03/2010 10:23:31  _____________________________________________________________________  External Attachment:    Type:   Image     Comment:   External Document

## 2010-04-20 NOTE — Progress Notes (Signed)
Summary: rx  Phone Note Call from Patient   Summary of Call: left message that she wants to know will you call in a rx for an antib. she does want to go to the hospital again call back at [phone] Initial call taken by: Lind Guest,  March 01, 2010 1:10 PM  Additional Follow-up for Phone Call Additional follow up Details #1::        patient calling again requesting for some medication  Additional Follow-up by: Eugenio Hoes,  March 01, 2010 1:28 PM    Additional Follow-up for Phone Call Additional follow up Details #2::    please advised patient no abx without OV, if no openings available, er or urgent care Follow-up by: Adella Hare LPN,  March 01, 2010 1:51 PM  Additional Follow-up for Phone Call Additional follow up Details #3:: Details for Additional Follow-up Action Taken: appt tomorrow Additional Follow-up by: Lind Guest,  March 01, 2010 1:58 PM

## 2010-04-20 NOTE — Letter (Signed)
Summary: dr.edward hawkins  dr.edward hawkins   Imported By: Lind Guest 03/31/2010 14:10:45  _____________________________________________________________________  External Attachment:    Type:   Image     Comment:   External Document

## 2010-04-20 NOTE — Letter (Signed)
[facility]: [facility]   Imported By: Lind Guest 03/10/2010 09:03:00  _____________________________________________________________________  External Attachment:    Type:   Image     Comment:   External Document

## 2010-04-27 ENCOUNTER — Telehealth: Payer: Self-pay | Admitting: Family Medicine

## 2010-05-05 ENCOUNTER — Encounter: Payer: Self-pay | Admitting: Family Medicine

## 2010-05-08 ENCOUNTER — Telehealth (INDEPENDENT_AMBULATORY_CARE_PROVIDER_SITE_OTHER): Payer: Self-pay

## 2010-05-10 NOTE — Letter (Signed)
Summary: Dr. Juanetta Gosling  Dr. Juanetta Gosling   Imported By: Lind Guest 05/05/2010 10:51:23  _____________________________________________________________________  External Attachment:    Type:   Image     Comment:   External Document

## 2010-05-10 NOTE — Progress Notes (Signed)
Summary: rx meds  Phone Note Call from Patient   Summary of Call: pt needs to speak with nurse about changing medicine. [phone] Initial call taken by: Rudene Anda,  April 27, 2010 1:22 PM  Follow-up for Phone Call        Humalog needs PA. Would you like to change to something else or do i need to fill out PA form in my folder? Follow-up by: Everitt Amber LPN,  April 27, 2010 1:27 PM  Additional Follow-up for Phone Call Additional follow up Details #1::        alwaysprefer to change to what is preffered, so pldsaccess the list and send me a msg Additional Follow-up by: Syliva Overman MD,  April 28, 2010 3:33 AM    Additional Follow-up for Phone Call Additional follow up Details #2::    Novolog, Humulin R and humalog Still needs PA I have a PA form to be filled out as soon as you decide on the drug. Patient has called back about it today Follow-up by: Everitt Amber LPN,  May 01, 2010 10:16 AM  Additional Follow-up for Phone Call Additional follow up Details #3:: Details for Additional Follow-up Action Taken: pls explain based on her labs, lantus alone should be enough, so no additional insulin now other than the lantus, she needs to get her HBA1C when due, 12 weeks after the last one pls ensure she understands and order if not done Additional Follow-up by: Syliva Overman MD,  May 01, 2010 2:24 PM  Advised patient that she didn't need the humalog at this time, just the lantus would be fine and she said that her numbers go up and down and she did need it. The other day her fasting was 309 and at bedtime it gets up to @ 205 sometimes.   pls advise start the lantus at 10 units once daily, test sughars fasting every morning if avg over 130 ten wikll need to inc  by 3 units, try and explain this to her . she needs to bring in her ranges after she follows trhis for at least 3 weeks, I cannot justify the needfor anioother insulion yet, she can test twice daily, up to 3 if she  wishes, offer diabetic teaching at [facility] as a supplement pls  Patient aware. Advised to titrate for 3 weeks and she understands. Is scared of her sugar shooting up during the day since she only has insulin for the nighttime only now and I advised her to call us if that happened and we would discuss it further at the next OV in 3 weeks Brandi hudy LPN

## 2010-05-11 ENCOUNTER — Other Ambulatory Visit (HOSPITAL_COMMUNITY): Payer: Self-pay | Admitting: Pulmonary Disease

## 2010-05-11 DIAGNOSIS — J984 Other disorders of lung: Secondary | ICD-10-CM

## 2010-05-16 ENCOUNTER — Other Ambulatory Visit (HOSPITAL_COMMUNITY): Payer: Self-pay

## 2010-05-16 NOTE — Progress Notes (Signed)
Summary: [facility] patient said is fine  Phone Note Call from Patient   Summary of Call: [facility] is the one that takes care of diabetic supplies so send rx there when they fax them over Initial call taken by: Lind Guest,  May 08, 2010 11:57 AM  Follow-up for Phone Call        noted Follow-up by: Everitt Amber LPN,  May 08, 2010 1:02 PM

## 2010-05-19 ENCOUNTER — Ambulatory Visit (HOSPITAL_COMMUNITY): Payer: Medicare Other

## 2010-05-27 ENCOUNTER — Emergency Department (HOSPITAL_COMMUNITY)
Admission: EM | Admit: 2010-05-27 | Discharge: 2010-05-28 | Disposition: A | Discharge status: Home / Self Care | Payer: Medicare Other | Attending: Emergency Medicine | Admitting: Emergency Medicine

## 2010-05-27 ENCOUNTER — Emergency Department (HOSPITAL_COMMUNITY): Payer: Medicare Other

## 2010-05-27 DIAGNOSIS — S8990XA Unspecified injury of unspecified lower leg, initial encounter: Secondary | ICD-10-CM | POA: Insufficient documentation

## 2010-05-27 DIAGNOSIS — S99929A Unspecified injury of unspecified foot, initial encounter: Secondary | ICD-10-CM | POA: Insufficient documentation

## 2010-05-27 DIAGNOSIS — M25579 Pain in unspecified ankle and joints of unspecified foot: Secondary | ICD-10-CM | POA: Insufficient documentation

## 2010-05-27 DIAGNOSIS — Y92009 Unspecified place in unspecified non-institutional (private) residence as the place of occurrence of the external cause: Secondary | ICD-10-CM | POA: Insufficient documentation

## 2010-05-27 DIAGNOSIS — W208XXA Other cause of strike by thrown, projected or falling object, initial encounter: Secondary | ICD-10-CM | POA: Insufficient documentation

## 2010-05-30 ENCOUNTER — Telehealth: Payer: Self-pay | Admitting: Family Medicine

## 2010-05-30 LAB — CBC
HCT: 33.3 % — ABNORMAL LOW (ref 36.0–46.0)
HCT: 38.2 % (ref 36.0–46.0)
Hemoglobin: 11.1 g/dL — ABNORMAL LOW (ref 12.0–15.0)
Hemoglobin: 12.9 g/dL (ref 12.0–15.0)
MCH: 29.5 pg (ref 26.0–34.0)
MCH: 29.8 pg (ref 26.0–34.0)
MCHC: 33.4 g/dL (ref 30.0–36.0)
MCHC: 33.8 g/dL (ref 30.0–36.0)
MCHC: 33.9 g/dL (ref 30.0–36.0)
MCV: 87.3 fL (ref 78.0–100.0)
Platelets: 202 10*3/uL (ref 150–400)
Platelets: 225 10*3/uL (ref 150–400)
RBC: 3.79 MIL/uL — ABNORMAL LOW (ref 3.87–5.11)
RBC: 4.12 MIL/uL (ref 3.87–5.11)
RBC: 4.38 MIL/uL (ref 3.87–5.11)
RDW: 13.4 % (ref 11.5–15.5)
RDW: 13.5 % (ref 11.5–15.5)
WBC: 8.4 10*3/uL (ref 4.0–10.5)
WBC: 9 10*3/uL (ref 4.0–10.5)

## 2010-05-30 LAB — GLUCOSE, CAPILLARY
Glucose-Capillary: 109 mg/dL — ABNORMAL HIGH (ref 70–99)
Glucose-Capillary: 124 mg/dL — ABNORMAL HIGH (ref 70–99)
Glucose-Capillary: 124 mg/dL — ABNORMAL HIGH (ref 70–99)
Glucose-Capillary: 134 mg/dL — ABNORMAL HIGH (ref 70–99)
Glucose-Capillary: 143 mg/dL — ABNORMAL HIGH (ref 70–99)
Glucose-Capillary: 145 mg/dL — ABNORMAL HIGH (ref 70–99)
Glucose-Capillary: 151 mg/dL — ABNORMAL HIGH (ref 70–99)
Glucose-Capillary: 151 mg/dL — ABNORMAL HIGH (ref 70–99)
Glucose-Capillary: 154 mg/dL — ABNORMAL HIGH (ref 70–99)
Glucose-Capillary: 170 mg/dL — ABNORMAL HIGH (ref 70–99)

## 2010-05-30 LAB — URINALYSIS, ROUTINE W REFLEX MICROSCOPIC
Bilirubin Urine: NEGATIVE
Glucose, UA: NEGATIVE mg/dL
Hgb urine dipstick: NEGATIVE
Ketones, ur: NEGATIVE mg/dL
Nitrite: NEGATIVE
Protein, ur: NEGATIVE mg/dL
Specific Gravity, Urine: <1.005 — ABNORMAL LOW (ref 1.005–1.030)
Urobilinogen, UA: 0.2 mg/dL (ref 0.0–1.0)
pH: 6 (ref 5.0–8.0)

## 2010-05-30 LAB — COMPREHENSIVE METABOLIC PANEL
AST: 26 U/L (ref 0–37)
Albumin: 3.8 g/dL (ref 3.5–5.2)
BUN: 8 mg/dL (ref 6–23)
Calcium: 9.2 mg/dL (ref 8.4–10.5)
Chloride: 105 mEq/L (ref 96–112)
Creatinine, Ser: 0.82 mg/dL (ref 0.4–1.2)
GFR calc Af Amer: >60 mL/min (ref >60)
Total Protein: 7.2 g/dL (ref 6.0–8.3)

## 2010-05-30 LAB — COMPREHENSIVE METABOLIC PANEL WITH GFR
ALT: 17 U/L (ref 0–35)
AST: 19 U/L (ref 0–37)
Albumin: 4.2 g/dL (ref 3.5–5.2)
Alkaline Phosphatase: 98 U/L (ref 39–117)
BUN: 8 mg/dL (ref 6–23)
CO2: 27 meq/L (ref 19–32)
Calcium: 9.3 mg/dL (ref 8.4–10.5)
Chloride: 102 meq/L (ref 96–112)
Creatinine, Ser: 0.74 mg/dL (ref 0.4–1.2)
GFR calc non Af Amer: >60 mL/min
Glucose, Bld: 116 mg/dL — ABNORMAL HIGH (ref 70–99)
Potassium: 3.4 meq/L — ABNORMAL LOW (ref 3.5–5.1)
Sodium: 139 meq/L (ref 135–145)
Total Bilirubin: 0.1 mg/dL — ABNORMAL LOW (ref 0.3–1.2)
Total Protein: 7.6 g/dL (ref 6.0–8.3)

## 2010-05-30 LAB — BLOOD GAS, ARTERIAL
Bicarbonate: 24.7 mEq/L — ABNORMAL HIGH (ref 20.0–24.0)
Bicarbonate: 25.7 mEq/L — ABNORMAL HIGH (ref 20.0–24.0)
Bicarbonate: 26.1 mEq/L — ABNORMAL HIGH (ref 20.0–24.0)
FIO2: 35 %
O2 Saturation: 96.6 %
Patient temperature: 37
Patient temperature: 37
TCO2: 22.5 mmol/L (ref 0–100)
TCO2: 22.6 mmol/L (ref 0–100)
TCO2: 23.7 mmol/L (ref 0–100)
pCO2 arterial: 45 mmHg (ref 35.0–45.0)
pH, Arterial: 7.351 (ref 7.350–7.400)
pH, Arterial: 7.357 (ref 7.350–7.400)
pH, Arterial: 7.421 — ABNORMAL HIGH (ref 7.350–7.400)
pO2, Arterial: 51.4 mmHg — ABNORMAL LOW (ref 80.0–100.0)
pO2, Arterial: 90.2 mmHg (ref 80.0–100.0)

## 2010-05-30 LAB — BASIC METABOLIC PANEL
Calcium: 8.9 mg/dL (ref 8.4–10.5)
Chloride: 106 mEq/L (ref 96–112)
Creatinine, Ser: 0.76 mg/dL (ref 0.4–1.2)
GFR calc Af Amer: >60 mL/min (ref >60)
GFR calc Af Amer: >60 mL/min (ref >60)
GFR calc non Af Amer: >60 mL/min (ref >60)
Potassium: 3.3 mEq/L — ABNORMAL LOW (ref 3.5–5.1)
Sodium: 139 mEq/L (ref 135–145)
Sodium: 140 mEq/L (ref 135–145)

## 2010-05-30 LAB — URINE CULTURE
Colony Count: NO GROWTH
Culture  Setup Time: 201111150238
Culture: NO GROWTH

## 2010-05-30 LAB — DIFFERENTIAL
Basophils Absolute: 0.1 10*3/uL (ref 0.0–0.1)
Basophils Relative: 1 % (ref 0–1)
Eosinophils Absolute: 0.2 10*3/uL (ref 0.0–0.7)
Eosinophils Relative: 0 % (ref 0–5)
Eosinophils Relative: 3 % (ref 0–5)
Lymphocytes Relative: 12 % (ref 12–46)
Lymphocytes Relative: 37 % (ref 12–46)
Lymphs Abs: 1 10*3/uL (ref 0.7–4.0)
Lymphs Abs: 3.1 10*3/uL (ref 0.7–4.0)
Monocytes Absolute: 0 10*3/uL — ABNORMAL LOW (ref 0.1–1.0)
Monocytes Absolute: 0.4 10*3/uL (ref 0.1–1.0)
Monocytes Relative: 0 % — ABNORMAL LOW (ref 3–12)
Monocytes Relative: 4 % (ref 3–12)
Neutro Abs: 4.6 10*3/uL (ref 1.7–7.7)
Neutro Abs: 7.7 10*3/uL (ref 1.7–7.7)
Neutrophils Relative %: 55 % (ref 43–77)

## 2010-05-30 LAB — CULTURE, BLOOD (ROUTINE X 2)

## 2010-05-30 LAB — MAGNESIUM: Magnesium: 2.2 mg/dL (ref 1.5–2.5)

## 2010-05-30 LAB — CARDIAC PANEL(CRET KIN+CKTOT+MB+TROPI)
CK, MB: 1.9 ng/mL (ref 0.3–4.0)
Relative Index: INVALID (ref 0.0–2.5)
Relative Index: INVALID (ref 0.0–2.5)
Total CK: 49 U/L (ref 7–177)
Total CK: 52 U/L (ref 7–177)
Troponin I: 0.02 ng/mL (ref 0.00–0.06)

## 2010-05-30 LAB — HEMOGLOBIN A1C
Hgb A1c MFr Bld: 6.1 % — ABNORMAL HIGH (ref <5.7)
Mean Plasma Glucose: 128 mg/dL — ABNORMAL HIGH (ref <117)

## 2010-05-30 LAB — POCT CARDIAC MARKERS: Myoglobin, poc: 57.8 ng/mL (ref 12–200)

## 2010-05-31 ENCOUNTER — Encounter: Payer: Self-pay | Admitting: Family Medicine

## 2010-06-05 ENCOUNTER — Ambulatory Visit (INDEPENDENT_AMBULATORY_CARE_PROVIDER_SITE_OTHER): Payer: Self-pay | Admitting: Internal Medicine

## 2010-06-05 ENCOUNTER — Ambulatory Visit: Payer: Self-pay | Admitting: Family Medicine

## 2010-06-06 ENCOUNTER — Encounter: Payer: Self-pay | Admitting: Family Medicine

## 2010-06-06 NOTE — Letter (Signed)
Summary: [facility]   Imported By: Lind Guest 05/31/2010 11:16:39  _____________________________________________________________________  External Attachment:    Type:   Image     Comment:   External Document

## 2010-06-06 NOTE — Progress Notes (Signed)
Summary: [facility]  Phone Note Call from Patient   Summary of Call: [facility] back and said that she recieved her supplies from there please call back at [phone] Initial call taken by: Lind Guest,  May 30, 2010 8:54 AM  Follow-up for Phone Call        Will fill out when faxed over Follow-up by: Everitt Amber LPN,  May 30, 2010 8:58 AM

## 2010-07-10 ENCOUNTER — Encounter: Payer: Self-pay | Admitting: Family Medicine

## 2010-07-11 ENCOUNTER — Ambulatory Visit: Payer: 59 | Admitting: Family Medicine

## 2010-07-11 ENCOUNTER — Encounter: Payer: Self-pay | Admitting: Family Medicine

## 2010-07-18 ENCOUNTER — Telehealth: Payer: Self-pay | Admitting: Family Medicine

## 2010-07-18 MED ORDER — LEVOTHYROXINE SODIUM 200 MCG PO TABS: 200.0000 ug | ORAL_TABLET | Freq: Every day | ORAL | Status: DC

## 2010-07-18 NOTE — Telephone Encounter (Signed)
Sent in

## 2010-07-20 ENCOUNTER — Telehealth: Payer: Self-pay | Admitting: Family Medicine

## 2010-08-04 NOTE — Cardiovascular Report (Signed)
NAMEENSLEE, [name] Fitzpatrick NO.:  [id number]   MEDICAL RECORD NO.:  [id number]          PATIENT TYPE:  INP   LOCATION:  2037                         FACILITY:  [facility]   PHYSICIAN:  Nanetta Batty, M.D.   DATE OF BIRTH:  [birth year]   DATE OF PROCEDURE:  DATE OF DISCHARGE:                              CARDIAC CATHETERIZATION   [name] is a 55 year old female with history of normal coronary  arteries by cath one year ago by Dr. Tresa Endo.  She has had recurrent chest  pain.  She was evaluated by Dr. Tresa Endo who felt she needed diagnostic  coronary arteriography to rule out ischemic etiology.   DESCRIPTION OF PROCEDURE:  Patient brought to the second floor [facility] in the postabsorptive state.  She was premedicated with  p.o. Valium.  The right groin was prepped and shaved in the usual sterile  fashion.  Xylocaine 1% was used for local anesthesia.  A 6 French sheath was  inserted into the right femoral artery using standard Seldinger technique.  The 6 French right and left Judkins diagnostic catheter as well as 6 French  pigtail catheter were used for selective coronary angiography, the left  ventriculography and supravalvular aortography to rule out an aortic  dissection.  Visipaque dye was used for the entirety of the case.  Retrograde aortic, left ventricular and ___________ pressures were recorded.   HEMODYNAMIC DATA:  Aortic systolic pressure 122, diastolic pressure 74.   Left ventricular systolic pressure 124, end diastolic pressure 24.   SELECTIVE CORONARY ANGIOGRAPHY:  1.  Left main normal.  2.  LAD normal.  3.  Left circumflex normal.  4.  Right coronary artery was large, dominant and normal.   LEFT VENTRICULOGRAPHY:  RAO left ventriculogram was performed using 25 mL of  Visipaque dye at 12 mL per second.  The overall LVEF estimated greater than  60% without focal wall motion abnormalities.   SUPRAVALVULAR AORTOGRAPHY:  Performed in the  LAO view using 20 mL of  Visipaque dye at 20 mL per second.  The arch vessels were intact.  There was  no AI.  There was no aortic aneurysmal dilatation or dissection.   IMPRESSION:  Ms. Pick has essentially normal coronary arteries, normal  left ventricular function and no evidence of aortic dissection.  I think  this chest pain is noncardiac.  ACT was measured and sheaths were removed.  Pressure was held in the groin to achieve hemostasis.  Patient left the lab  in stable condition.  She will be discharged home later today as an  outpatient on empiric antireflux measures and will see Dr. Tresa Endo back in two  to three weeks for follow-up.  She left the lab in stable condition.      Nanetta Batty, M.D.  Electronically Signed     JB/MEDQ  D:  12/29/2004  T:  12/29/2004  Job:  [id number]   cc:   2nd floor [facility]   [facility]  [address], [place] [address]   Nicki Guadalajara, M.D.  Fax: [phone]  Kirk Ruths, M.D.  Fax: [phone]

## 2010-08-04 NOTE — H&P (Signed)
[name] Fitzpatrick, CLINE NO.:  [id number]   MEDICAL RECORD NO.:  [id number]          PATIENT TYPE:  INP   LOCATION:  A337                          FACILITY:  [facility]   PHYSICIAN:  Kirk Ruths, M.D.DATE OF BIRTH:  [birth year]   DATE OF ADMISSION:  DATE OF DISCHARGE:  LH                                HISTORY & PHYSICAL   CHIEF COMPLAINT:  Severe back pain.   PRESENTING ILLNESS:  This is a 55 year old female who has chronic back pain  for which she takes high doses of narcotics.  Patient day before admission  was sleeping on her porch and leaned over, felt a sudden pop in her back  which was incapacitating.  Patient made it to the bed where she made it  through the night.  When she tried to arise out of the bed the following day  she was in severe pain, radiating to the right buttock and right leg.  She  was seen in the emergency room, treated with multiple injections of pain  medications with still continued pain.  She was admitted for further control  of her pain and further evaluation.  Patient has had a titanium cage from  her low spine at [facility] approximately 12 years ago and then in 2003 Dr. Tasia Catchings  __________ of [facility] put titanium rods in her back due to the fact that the  cage was somewhat loose.  The patient states she has full control of her  bowels and bladder and no significant numbness or weakness.   PAST MEDICAL HISTORY:  She is allergic to [facility] and PENICILLIN.  She states  she has history of coronary artery disease, although she had normal coronary  catheterization in December of 2006 by Dr. Tresa Endo.  Patient has depression,  GERD, hypertension, hypothyroidism.  She takes Norvasc and Toprol, unsure of  the dose at this time.  She is on OxyContin 40 two to three times a day as  well as oxycodone 5 mg as needed.  She takes Valium 10 mg q.6h. p.r.n.  muscle spasms.  She is on thyroid supplements and Aciphex.   REVIEW OF SYSTEMS:  Denies chest pains,  shortness of breath, nausea,  vomiting, diarrhea.   SOCIAL HISTORY:  Patient is disabled and is a smoker.  Denies alcohol use.   PHYSICAL EXAMINATION:  GENERAL:  Middle-aged female who appears miserable  due to pain.  VITAL SIGNS:  Blood pressure 110/70, pulse is 100 and regular, respirations  are 20, unlabored.  HEENT:  TMs are normal.  Pupils equal, react to light and accommodation.  Oropharynx benign.  NECK:  Supple without JVD, bruit, thyromegaly.  LUNGS:  Clear in all areas.  HEART:  Regular without murmur, gallop, or rub.  ABDOMEN:  Soft and nontender.  EXTREMITIES:  Without clubbing, cyanosis, edema.  BACK:  Low back with tenderness right greater than the left.  Exquisitely  positive straight leg raising.  NEUROLOGIC:  Grossly intact.  Reflexes bilaterally equal.  Motor full.  No  paraesthesias.      Kirk Ruths, M.D.  Electronically Signed     WMM/MEDQ  D:  09/10/2005  T:  09/10/2005  Job:  [id number]

## 2010-08-04 NOTE — Consult Note (Signed)
[name], [name] NO.:  [id number]   MEDICAL RECORD NO.:  [id number]          PATIENT TYPE:  INP   LOCATION:  A337                          FACILITY:  [facility]   PHYSICIAN:  Vickki Hearing, M.D.DATE OF BIRTH:  02-Nov-1955   DATE OF CONSULTATION:  09/10/2005  DATE OF DISCHARGE:                                   CONSULTATION   REQUESTING PHYSICIAN:  Kirk Ruths, M.D.   CONSULTING PHYSICIAN:  Dr. Romeo Apple   CHIEF COMPLAINT:  Back pain.   This is a 55 year old female who has had lumbar fusion x2, the first time  with titanium cages at [facility], second time at [facility]  with the addition of pedicle screws and titanium rod in addition to the  cages.  The patient was discharged from care at that time in 2003 and was  sent back to her family physicians for further management.  She indicates  she was off of pain medication for a short period of time, but eventually  started having back pain and eventually was managed with OxyContin 40 mg two  to three times a day and OxyIR 5 mg as needed.   The patient reports on Saturday had the second episode of a popping  sensation in her lumbar spine.  The first time she was turning over in bed  and felt something pop in the back, shot a pain down her right leg.  She can  not really locate where.  Is not sure if it went below the knee are not, and  then the second time she was sweeping and bent over to get the trash up and  felt it again and this time presented to the emergency room.   She primarily complains of pain in the right buttock, also in the lumbar  spine and says it intermittently runs down the leg but she is not sure  exactly how far.  She denies any weakness.  Denies numbness.  Denies lack of  bowel or bladder control.   She is allergic KEFLEX and PENICILLIN.  She has a history of coronary artery  disease with a normal catheterization 2006 in December by Dr. Tresa Endo.  She  complains of  depression, GERD, hypertension, hypothyroidism.  She takes  normal Norvasc and Toprol as well as the medicines as mentioned.  She also  takes Valium 10 mg q.6h. for muscle spasms.  She is on thyroid  supplementation and Aciphex.   REVIEW OF SYSTEMS:  Reveals no other findings.   SOCIAL HISTORY:  She is currently disabled.  She does smoke.  Denies any  alcohol use.   Appearance:  The patient was in bed lying on her side.  Appeared to be in  some moderate distress.  Her eyes appeared to be glassy to me and she did  respond to questions appropriately and gave a history, but  could not give  details about the nature of this radicular type pain.   Motor examination:  Flexion, extension of the knees, hips, dorsiflexion,  plantar flexion of both feet were normal.   Her lumbar spine she reported numbness  or lack of feeling around her  incision.  Had tenderness in the right flank right, right buttock, and in  the posterior aspect of the right thigh.  Pulses were good.  She was  reported normal sensation in both feet, legs, and thighs.   There was no lymphadenopathy, lymphedema, or lymphangitis.   Her radiographs show what appears to be a stable fusion years ago from L4-S1  with six pedicle screws, four cages, and two titanium rods.   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   Whether her symptoms are acute or not is hard to tell.  She will need  further imaging study, perhaps MRI if the cages and titanium construct is  compatible with that, if not CT scan to evaluate the levels above the  fusion.  She will obviously need pain management and neurosurgical referral  once her acute symptoms are over.      Vickki Hearing, M.D.  Electronically Signed     Vickki Hearing, M.D.  Electronically Signed    SEH/MEDQ  D:  09/10/2005  T:  09/11/2005  Job:  [id number]

## 2010-08-04 NOTE — Discharge Summary (Signed)
[name] Fitzpatrick, [name] Fitzpatrick NO.:  [id number]   MEDICAL RECORD NO.:  [id number]          PATIENT TYPE:  INP   LOCATION:  2037                         FACILITY:  [facility]   PHYSICIAN:  Nicki Guadalajara, M.D.     DATE OF BIRTH:  06/14/1955   DATE OF ADMISSION:  12/28/2004  DATE OF DISCHARGE:  12/29/2004                                 DISCHARGE SUMMARY   DISCHARGE DIAGNOSIS:  1.  Chest pain, noncardiac in origin with normal coronaries by      catheterization this admission.  2.  Chronic pain disability secondary to post laminectomy syndrome and      fibromyalgia.  3.  Depression.   HOSPITAL COURSE:  The patient is a 55 year old female who had a  catheterization in May of 2005, showing normal coronaries and has been  followed by Dr. Tresa Endo. The patient has chronic pain and is on disability.  She has a Duragesic patch. She began having chest pain prior to admission  and presented to [facility] ER with chest pain. She was put on heparin and  nitro at [facility] and her enzymes were negative. She was set up for  diagnostic catheterization after she ruled out for an MI. This was done  December 29, 2004 and was negative for coronary disease and she had normal LV  function. She was discharged late on December 29, 2004.   DISCHARGE MEDICATIONS:  1.  Levoxyl 0.15 mg a day.  2.  Duragesic patch as taken at home.  3.  Cymbalta 60 mg a day.  4.  Naprosyn 300 mg as directed.  5.  Toprol XL 25 mg a day.  6.  Norvasc 2.5 mg a day.  7.  Prilosec OTC once a day.   LABORATORY DATA:  Chest x-ray showed scarring at the bases, no active  disease. White count 10.4, hemoglobin 11.9, hematocrit 34.6, platelets 295.  Sodium 140, potassium 4.1, BUN 7, creatinine 0.7. CK-MB troponins were  negative. Cholesterol was 208, triglycerides 168, HDL 47, LDL 127. TSH 2.76.   DISPOSITION:  The patient was discharged in stable condition and will follow-  up with Dr. Tresa Endo and Dr. Regino Schultze as an  outpatient.      Abelino Derrick, P.A.    ______________________________  Nicki Guadalajara, M.D.    Lenard Lance  D:  03/20/2005  T:  03/20/2005  Job:  [id number]   cc:   Kirk Ruths, M.D.  Fax: [phone]

## 2010-08-04 NOTE — Procedures (Signed)
NAMECRISSIE, [name] Fitzpatrick NO.:  [id number]   MEDICAL RECORD NO.:  [id number]          PATIENT TYPE:  OUT   LOCATION:  SLEEP LAB                     FACILITY:  [facility]   PHYSICIAN:  Marcelyn Bruins, M.D. [facility] DATE OF BIRTH:  1956-01-04   DATE OF STUDY:                              NOCTURNAL POLYSOMNOGRAM   REFERRING PHYSICIAN:  Aundra Dubin, M.D.   DATE OF STUDY:  March 17, 2005.   INDICATIONS FOR STUDY:  Persistent disorder of initiating and maintaining  sleep.   EPWORTH SCORE:  14.   SLEEP ARCHITECTURE:  The patient total sleep time of 271 minutes with  significantly decreased REM and slow wave sleep. Sleep onset latency was  prolonged at 81 minutes, and REM onset was very prolonged at 339 minutes.  Sleep efficiency was very poor at 63%.   RESPIRATORY DATA:  The patient was found to have 5 hypopneas and 2 apneas  for a respiratory disturbance index of 1.8 events per hour. Events were not  positional, and mild snoring was noted. The patient did not meet split night  protocol secondary to the small numbers of events.   OXYGEN DATA:  The patient had O2 desaturation as low as 82% with her  obstructive events. There were other O2 desaturations noted; however, this  was clearly secondary to probe malfunction when the study was observed in  detail. No clinical intervention is recommended at this time.   CARDIAC DATA:  No clinically significant cardiac arrhythmias.   MOVEMENT/PARASOMNIA:  The patient was found to have very small numbers of  leg jerks with no significant sleep disruption. There were no behavioral  abnormalities noted during sleep as well.   IMPRESSION/RECOMMENDATIONS:  Small numbers of events which do not meet the  respiratory disturbance index for the  obstructive sleep apnea syndrome.  There were no other significant findings on this nocturnal polysomnogram.                                            ______________________________  Marcelyn Bruins, M.D. [facility]  Diplomate, American Board of Sleep  Medicine     KC/MEDQ  D:  03/23/2005 11:18:47  T:  03/23/2005 16:37:30  Job:  [id number]

## 2010-08-04 NOTE — Assessment & Plan Note (Signed)
REFERRING PHYSICIAN:  Doreen Beam, M.D.   A 55 year old female with lumbar post laminectomy syndrome complicated by  history of fibromyalgia, history of depression.  She has been on a stable  dose of Duragesic 75 mcg since February.  She did discontinue her Seroquel  as well as Effexor on her own accord without consultation from her  psychiatric practitioner, Donnie Aho.  She had been on Seroquel 50 mg in  the morning and 100 q.h.s. and also on Effexor 150 mg p.o. daily.   She feels like she is ready to jump through her skin at times.  She has also  been evaluated by cardiology for what sounds like chest pain per her report  and reportedly had cardiac catheterization that was negative.  I do not have  any record of this.  She is taking Motrin frequently.  Pain averaging 7/10.  Pain areas across the chest and shoulder area, posterior neck, low back,  posterior thigh and anterior thigh and legs.  She has had no new injuries.  Her pain exacerbating factor is walking, bending, sitting and improving with  medication.   SOCIAL HISTORY:  Smokes half a pack a day for 31 years.  Lives with her  husband.   REVIEW OF SYMPTOMS:  Positive heart trouble, shortness of breath, swelling,  palpitations, abnormal heart rhythm, wheezing, coughing and chest pain.  GI:  She has positive nausea, vomiting, diarrhea, constipation, frequent  urination, abdominal pain, poor appetite.  She does have history of  irritable bowel.  Also positive for weakness, numbness, dizziness, spasms,  anxiety, depression, poor sleep, agitation, headaches.  No suicidal  thoughts.  Additional positive review of systems, fever, chills, weight  loss, swelling, excessive sweating, easy bleeding, bruising.   PHYSICAL EXAMINATION:  VITAL SIGNS:  Weight 185 pounds, blood pressure  103/68, O2 saturation 99% on room air.   Examination was done with Dr. Alfonse Alpers from [facility] present.   Interview was done with Dr. Alfonse Alpers  present as well.   Palpation of fibromyalgia tender points reveals positive bilateral hip,  bilateral sternal costal right elbow and left knee.   Motor strength is 5/5 bilateral deltoid, biceps, triceps, grip, __________  hip flexion, knee extension, and extension and gross flexion.  Deep tendon  reflexes are 2+ bilateral biceps, triceps, brachial radialis, knee and  ankle.   Range of motion is full bilateral upper and lower extremities.   IMPRESSION:  1. Post laminectomy syndrome complicated by fibromyalgia.  I think her     exacerbation is actually due to coming off her psychiatric medications     and I have explained that fibromyalgia and depression as well as chronic     pain and depression go hand in hand.  Recommend that she return to see     Donnie Aho, get restarted on her medications, and not increase her     Duragesic patch.  She states that she disagreed with this but would go     back and see Donnie Aho.  I have scheduled her back to see me in two     months to give her time to get stabilize don her psychiatric medications.     Plan on urine drug screen next visit.  2. The patient will follow up with cardiology regarding chest pain symptoms.  3. The patient will follow up with Dr. Regino Schultze in regard to her general     medical condition.  Erick Colace, M.D.   AEK/MedQ  D:  08/02/2003 16:16:29  T:  08/02/2003 17:40:55  Job #:  [id number]   cc:   Doreen Beam  [address]  [place]  [place] [address]  Fax: [phone]   Donnie Aho, N.P.   Kirk Ruths, M.D.  [address]  [place]  [place] [address]  Fax: [phone]

## 2010-08-04 NOTE — Discharge Summary (Signed)
NAMEHIDAYA, DANIEL NO.:  [id number]   MEDICAL RECORD NO.:  [id number]          PATIENT TYPE:  INP   LOCATION:  A305                          FACILITY:  [facility]   PHYSICIAN:  Kirk Ruths, M.D.DATE OF BIRTH:  1956/01/17   DATE OF ADMISSION:  09/09/2005  DATE OF DISCHARGE:  06/29/2007LH                                 DISCHARGE SUMMARY   DISCHARGE DIAGNOSES:  1.  Intractable low-back pain.  2.  Cellulitis.  3.  Hypothyroidism.  4.  Hypertension.  5.  Gastroesophageal reflux disease.  6.  Depression.   HOSPITAL COURSE:  This 55 year old female with chronic low-back pain with  longstanding problems.  The patient has had past surgery on her back by Dr.  Casandra Doffing at [facility] with a titanium cage and rods.  On this occasion the  patient leaned over and felt an incapacitating pop in her back.  She was  unrelieved of her pain in the emergency room.  She was admitted for IV pain  control and further evaluation.  The patient was seen in consultation by Dr.  Fuller Canada for orthopedics.  The patient underwent a regular LS spine  film which showed no abnormality and satisfactory screw and fusions.  The  patient subsequently underwent an MRI of her LS spine which showed no  herniation, normal fusions at L4-5 and at L5-S1, no significant stenosis.  The patient also received physical therapy.  She slowly improved back  towards her baseline.  She did develop cellulitis in the left axillary area  for which she was treated with Bactrim.  We had arranged for outpatient  physical therapy.  She was discharged home on the Bactrim as well as her  previous medications, which included oxycodone, OxyIR, Valium, Cymbalta,  Xanax, 225 mcg of levothyroxine, Norvasc 5, Toprol-XL 50.  To be followed up  in the office in 1-2 weeks.      Kirk Ruths, M.D.  Electronically Signed     WMM/MEDQ  D:  10/04/2005  T:  10/04/2005  Job:  [id number]

## 2010-08-04 NOTE — Assessment & Plan Note (Signed)
REASON FOR VISIT:  A 55 year old female, lumbar post laminectomy syndrome  complicated by history of fibromyalgia, good relief with current pain  regimen, but had some problems in her family life such as her grandmother  dying, her mother becoming critically ill, that she did not refill her  Duragesic, and went into withdrawal.  She fortunately had some patches at  home, reapplied one, and no longer had withdrawal.  Looking back at her  prescription copies she basically had a 35-month supply as of February 04, 2003 so that she should have run out several weeks ago.  She denies seeing  any other medical doctors other than her psychiatric practitioner, and  denies any new injuries.  Her coccyx pain has improved.  Had had a fall some  time prior to last visit in November but this is no longer an issue.  Did  not follow through on coccyx film.   MEDICATIONS:  1. Zanaflex 80 mg t.i.d.  2. Recently started Seroquel 25 mg two p.o. in the morning and 100 mg q.h.s.     Notices some twitching in the legs at night.  3. Effexor 150 mg p.o. daily.  4. Elavil 50 p.o. listed as p.r.n.  5. Levoxyl 0.125 mg p.o. daily.   SOCIAL HISTORY:  Married, smokes a half pack a day, disabled since 2002.   Pain scores are 5-8/10, pain diagram shows pain over the posterior part of  the neck, posterior mid back to low back to posterior thighs and calves.  Pain is made worse with bending, sitting, working; improves with rest, heat,  and medication.   REVIEW OF SYSTEMS:  Positive palpitations, abnormal heart rhythm, chest  pain, shortness of breath, swelling, colds, respiratory infection, wheezing,  coughing, numbness, dizziness, anxiety, poor sleep, headaches, nausea,  vomiting, diarrhea, constipation, poor appetite, swelling, excess sweating,  easy bleeding, weight gain, bruising.  Denies suicidal thoughts.  No bowel  or bladder incontinence.   PHYSICAL EXAMINATION:  Blood pressure 126/66, pulse 103, O2  saturation 97%  room air.   In general, depressed-appearing, emotionally labile.  No tenderness to  palpation other than in the thoracic paraspinal middorsal region.  Back  range of motion 50% forward flexion, 25% extension and lateral rotation,  bending.  Motor strength is normal bilateral upper and lower extremities.   IMPRESSION:  1. Lumbar post laminectomy syndrome.  2. Fibromyalgia.  3. History of depression.   PLAN:  1. Will reduce Duragesic dosage considering that she may have some     psychomotor effects potentiated by Seroquel.  This is a leg twitching.  2. The patient to follow up with psychology.  3. The patient to follow up with primary care for her positive review of     systems.   I will see her back in approximately 1 month.  Will check LFTs for her  Zanaflex.      Erick Colace, M.D.   AEK/MedQ  D:  04/30/2003 13:48:35  T:  04/30/2003 14:48:12  Job #:  [id number]   cc:   Oneta Rack, N.P.   Dr. Leonette Nutting
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
## 2010-08-04 NOTE — Assessment & Plan Note (Signed)
Ms. Dedmon returns today after I last saw her on April 30, 2003.  She  is a 55 year old female with lumbar post laminectomy syndrome complicated by  a history of fibromyalgia and history of depression.  We reduced her  Duragesic from 100 mg to 75 mg last visit.  The pain scores last were 5-8  and now they are 5-7.  She continues to see her psychiatric practitioner,  Donnie Aho.  She has continued on her Seroquel 100 mg p.o. q.h.s. and 50  mg in the morning.   Her liver function tests were normal.  She has this monitored due to her  Zanaflex.  She states that she does take Tylenol, as well as Motrin from  time to time.  She takes up to three Motrin at a time, although not usually  more than once or twice a day.   Her pain diagram continues to show pain in left posterior arm, posterior  lower extremities to the calf level bilaterally, as well as the entire  spine.   INTERNAL HISTORY:  No new medical diagnoses.  No hospitalizations.  She has  seen her psychiatric practitioner, but not her primary doctor.  She does see  Dr. Regino Schultze.   SOCIAL HISTORY:  Smokes half of a pack a day for 30 years.  Lives with her  husband.   REVIEW OF SYSTEMS:  Unchanged compared to last visit, that is positive for  palpitations and abnormal heart rhythm.  She does have a history of MVP.  Chest pain, shortness of breath, and swelling.  She has a cold currently and  has coughing.  GI is positive for nausea, vomiting, reflux, diarrhea,  constipation, abdominal pain, poor appetite, and bowel incontinence which  she states is chronic.  Neurologic and psychiatric unchanged with weakness,  numbness, dizziness, spasms, blurred vision, anxiety, depression, poor  sleep, headaches, and other problems, including easy bleeding, weight gain,  bruising, excessive sweating, swelling, and chills.   PHYSICAL EXAMINATION:  Blood pressure 127/77, pulse 92, respirations 16, O2  saturation 100% on room air.  Affect is  alert and normal.  She is not labile  on this visit.  Her gait has mild antalgia.  No foot drag or knee  instability.  She has tenderness to palpation along her entire cervical,  thoracic, and lumbar paraspinal musculature.  She has full muscle strength  in bilateral upper and lower extremities.  Her sensation is hyperesthetic  right foot compared to the left with pinprick.  No loss of sensation.  Deep  tendon reflexes are normal in bilateral upper extremities.  Range of motion  normal in bilateral upper extremities.   IMPRESSION:  1. Lumbar post laminectomy syndrome with chronic radicular-type pain.  2. Fibromyalgia syndrome with sleep disturbance.  3. Irritable bowel syndrome-type symptoms.   RECOMMENDATIONS:  Discussion of medications with the patient.  She denies  any suicidal ideations.  She has had no major flare up on her pain with a  reduced Duragesic dose.  She is happy to continue at the 75 mcg dose and  supplementing with Motrin or Tylenol.  She was notified of her normal LFTs  and this is something we can continue to monitor given she is on Zanaflex  and taking NSAIDs and acetaminophen.   I will see her back in two months.  She will pick up a prescription at  interval time.      Erick Colace, M.D.   AEK/MedQ  D:  05/31/2003 17:38:53  T:  05/31/2003 20:18:27  Job #:  [id number]   cc:   Kirk Ruths, M.D.  [address]  [place]  [place] [address]  Fax: [phone]

## 2010-08-04 NOTE — H&P (Signed)
NAMEZENYA, HICKAM NO.:  [id number]   MEDICAL RECORD NO.:  [id number]          PATIENT TYPE:  INP   LOCATION:  ED99                          FACILITY:  [facility]   PHYSICIAN:  Corrie Mckusick, M.D.  DATE OF BIRTH:  October 29, 1955   DATE OF ADMISSION:  12/28/2004  DATE OF DISCHARGE:  10/12/2006LH                                HISTORY & PHYSICAL   ADMISSION DIAGNOSES:  Chest pain.   HISTORY OF PRESENT ILLNESS:  This is a 55 year old female with history of  migraines, fibromyalgia, IBS, degenerative disk disease, mitral valve  prolapse, and hypothyroidism who presents with 24 hours of worsening  substernal chest pain. She states that this was not brought on by exercise  and came on sporadically. She said that it was associated with shortness of  breath and radiation to both shoulders and arms. Some associated nausea. No  associated other respiratory symptoms. No fevers, chills, or other  complaints.   The patient states to me that she has had coronary artery disease in the  past and had a MI. This is unclear whether this is the case. It sounds as if  from what I can tell from prior notes that she had an abnormal EKG and had a  negative Cardiolite in 2005 by [facility].   She is chest pain free now on a nitroglycerin drip in the emergency  department.   PAST MEDICAL HISTORY:  1.  Migraines.  2.  Fibromyalgia.  3.  IBS.  4.  Degenerative disk disease.  5.  Mitral valve prolapse.  6.  Hypothyroidism.  7.  Question of a  MI in May of 2005.   PAST SURGICAL HISTORY:  Titanium cage of the spine, rods in the back in  2002, two cervical fusions in October 2003.   SOCIAL HISTORY:  Continues to smoke a pack a day. No alcohol. No illicit  drugs. She is disabled.   FAMILY HISTORY:  Significant for coronary artery disease, hypertension,  diabetes.   MEDICATIONS ON ADMISSION:  1.  Cymbalta 60 daily.  2.  Duragesic patches 100 mcg every 3 days..  3.   Levoxyl 150 mcg daily.  4.  Oxycodone 5 mg q.4-6h. This is given by Dr. _____________.   ALLERGIES:  PENICILLIN, KEFLEX, PREDNISONE.   OBJECTIVE:  VITAL SIGNS:  Afebrile. Pulse is 72. Blood pressure 130/82,  respiratory rate 18.  GENERAL:  When I saw her, she was lying in the emergency department in no  acute distress, complaining of no chest pain.  HEENT:  Nasopharynx clear.  NECK:  Supple.  CHEST:  Clear to auscultation bilaterally.  CARDIOVASCULAR:  Regular rate and rhythm. No murmurs.  ABDOMEN:  Soft, nontender, nondistended.  EXTREMITIES:  No edema.   LABORATORY DATA:  Please see flow sheet for details. Initial point of care  markers negative. EKG showed some Q waves but no change from prior EKG.   ASSESSMENT:  A 55 year old female with fibromyalgia, migraines, IBS,  degenerative disk disease, mitral valve prolapse and questionable prior  myocardial infarction who presents with chest pain.   PLAN:  1.  Try to  admit to the unit, but there is no unit beds at [facility], so      she is set up for transfer to [facility].  2.  Continue nitroglycerin drip but keep chest pain free.  3.  Go ahead and cover with heparin per protocol.  4.  Beta blocker.  5.  Aspirin daily.  6.  Consult [facility] and set up further transfer to [facility].  7.  Continue cardiac markers while she is at [facility] q.8h. x3.      Corrie Mckusick, M.D.  Electronically Signed     JCG/MEDQ  D:  12/28/2004  T:  12/28/2004  Job:  [id number]

## 2010-08-21 ENCOUNTER — Telehealth: Payer: Self-pay | Admitting: Family Medicine

## 2010-08-21 MED ORDER — LEVOTHYROXINE SODIUM 200 MCG PO TABS: 200.0000 ug | ORAL_TABLET | Freq: Every day | ORAL | Status: DC

## 2010-08-21 NOTE — Telephone Encounter (Signed)
Resent to [employer]

## 2010-08-22 ENCOUNTER — Encounter: Payer: Self-pay | Admitting: Family Medicine

## 2010-08-23 ENCOUNTER — Encounter: Payer: Self-pay | Admitting: Family Medicine

## 2010-08-23 ENCOUNTER — Ambulatory Visit (INDEPENDENT_AMBULATORY_CARE_PROVIDER_SITE_OTHER): Payer: 59 | Admitting: Family Medicine

## 2010-08-23 VITALS — BP 120/80 | HR 90 | Resp 16 | Ht 62.5 in | Wt 215.0 lb

## 2010-08-23 DIAGNOSIS — E039 Hypothyroidism, unspecified: Secondary | ICD-10-CM

## 2010-08-23 DIAGNOSIS — R1902 Left upper quadrant abdominal swelling, mass and lump: Secondary | ICD-10-CM | POA: Insufficient documentation

## 2010-08-23 DIAGNOSIS — R519 Headache, unspecified: Secondary | ICD-10-CM | POA: Insufficient documentation

## 2010-08-23 DIAGNOSIS — R51 Headache: Secondary | ICD-10-CM

## 2010-08-23 DIAGNOSIS — I341 Nonrheumatic mitral (valve) prolapse: Secondary | ICD-10-CM

## 2010-08-23 DIAGNOSIS — M545 Low back pain, unspecified: Secondary | ICD-10-CM

## 2010-08-23 DIAGNOSIS — F329 Major depressive disorder, single episode, unspecified: Secondary | ICD-10-CM

## 2010-08-23 DIAGNOSIS — R0989 Other specified symptoms and signs involving the circulatory and respiratory systems: Secondary | ICD-10-CM

## 2010-08-23 DIAGNOSIS — F411 Generalized anxiety disorder: Secondary | ICD-10-CM

## 2010-08-23 DIAGNOSIS — E109 Type 1 diabetes mellitus without complications: Secondary | ICD-10-CM

## 2010-08-23 DIAGNOSIS — E785 Hyperlipidemia, unspecified: Secondary | ICD-10-CM

## 2010-08-23 DIAGNOSIS — I059 Rheumatic mitral valve disease, unspecified: Secondary | ICD-10-CM

## 2010-08-23 NOTE — Progress Notes (Signed)
  Subjective:    Patient ID: [name] Fitzpatrick, female    DOB: 13-Jan-1956, 55 y.o.   MRN: [id number]  HPI 2 month h/o severe disabling headache worse than her gen migraine, often vomits, 10 plus,, also causes  her  to stop eating.She is concerned about a cyst/swelling on the left side of her abdomen that she wants evaluated further. She reports intermittent lightheadedness, and requests to be checked for blockage in her neck arteries No recent asthma flares.   Review of Systems Denies recent fever or chills. Denies sinus pressure, nasal congestion, ear pain or sore throat. Chronic cough , unchanged, no sputum, no wheezing currently Denies chest pains, palpitations, paroxysmal nocturnal dyspnea, orthopnea and leg swelling. C/o fatigue and light headedness at times Denies abdominal pain, nausea, vomiting,diarrhea or constipation.  Denies rectal bleeding or change in bowel movement. Denies dysuria, frequency, hesitancy or incontinence. Chronic  joint pain,  and limitation in mobility. Denies seizure, numbness, or tingling. Chronic depression, and anxiety , not suicidal or homicidal, controlled on medication Denies skin break down or rash.        Objective:   Physical Exam Patient alert and oriented and in no Cardiopulmonary distress.  HEENT: No facial asymmetry, EOMI, no sinus tenderness, TM's clear, Oropharynx pink and moist.  Neck decreased ROM no adenopathy.Bruits  Chest:Decreased air entry bilaterally, no crackles, few wheezes  CVS: S1, S2 no murmurs, no S3.  ABD: Soft non tender. Bowel sounds normal.left upper quadrant mass.  Ext: No edema  MS: decreased  ROM spine, shoulders, hips and knees.  Skin: Intact, no ulcerations or rash noted.  Psych: Good eye contact, flat  affect. depressed appearing.  CNS: CN 2-12 intact,         Assessment & Plan:

## 2010-08-23 NOTE — Patient Instructions (Addendum)
F/u in 3 months   Fasting labs asap  You are being referred to cardiology, neurology and for an mRI of your brain  You are also referred for a carotid doppler to evaluate for blockage and a scan of your abdomen

## 2010-08-28 ENCOUNTER — Ambulatory Visit (HOSPITAL_COMMUNITY): Payer: Medicare Other

## 2010-08-29 ENCOUNTER — Ambulatory Visit (HOSPITAL_COMMUNITY)
Admission: RE | Admit: 2010-08-29 | Discharge: 2010-08-29 | Disposition: A | Discharge status: Home / Self Care | Payer: Medicare Other | Source: Ambulatory Visit | Attending: Family Medicine | Admitting: Family Medicine

## 2010-08-29 DIAGNOSIS — F17200 Nicotine dependence, unspecified, uncomplicated: Secondary | ICD-10-CM | POA: Insufficient documentation

## 2010-08-29 DIAGNOSIS — I10 Essential (primary) hypertension: Secondary | ICD-10-CM | POA: Insufficient documentation

## 2010-08-29 DIAGNOSIS — E119 Type 2 diabetes mellitus without complications: Secondary | ICD-10-CM | POA: Insufficient documentation

## 2010-08-29 DIAGNOSIS — R0989 Other specified symptoms and signs involving the circulatory and respiratory systems: Secondary | ICD-10-CM

## 2010-08-30 ENCOUNTER — Telehealth: Payer: Self-pay | Admitting: Family Medicine

## 2010-08-30 ENCOUNTER — Other Ambulatory Visit: Payer: Self-pay | Admitting: Family Medicine

## 2010-08-30 NOTE — Telephone Encounter (Signed)
Patient aware.

## 2010-08-31 LAB — HEPATIC FUNCTION PANEL
ALT: 19 U/L (ref 0–35)
AST: 20 U/L (ref 0–37)
Albumin: 4.2 g/dL (ref 3.5–5.2)

## 2010-08-31 LAB — BASIC METABOLIC PANEL WITH GFR
Calcium: 9.7 mg/dL (ref 8.4–10.5)
Creat: 0.83 mg/dL (ref 0.50–1.10)
GFR, Est African American: >60 mL/min (ref >60)
GFR, Est Non African American: >60 mL/min (ref >60)

## 2010-08-31 LAB — LIPID PANEL
Cholesterol: 163 mg/dL (ref 0–200)
HDL: 51 mg/dL (ref >39)
Total CHOL/HDL Ratio: 3.2 Ratio
VLDL: 23 mg/dL (ref 0–40)

## 2010-08-31 LAB — TSH: TSH: 1.123 u[IU]/mL (ref 0.350–4.500)

## 2010-09-01 ENCOUNTER — Ambulatory Visit (HOSPITAL_COMMUNITY)
Admission: RE | Admit: 2010-09-01 | Discharge: 2010-09-01 | Disposition: A | Discharge status: Home / Self Care | Payer: Medicare Other | Source: Ambulatory Visit | Attending: Family Medicine | Admitting: Family Medicine

## 2010-09-01 ENCOUNTER — Other Ambulatory Visit: Payer: Self-pay | Admitting: Family Medicine

## 2010-09-01 ENCOUNTER — Ambulatory Visit (HOSPITAL_COMMUNITY)
Admission: RE | Admit: 2010-09-01 | Discharge: 2010-09-01 | Disposition: A | Discharge status: Home / Self Care | Payer: Medicare Other | Source: Ambulatory Visit | Attending: Pulmonary Disease | Admitting: Pulmonary Disease

## 2010-09-01 ENCOUNTER — Other Ambulatory Visit (HOSPITAL_COMMUNITY): Payer: Self-pay | Admitting: Pulmonary Disease

## 2010-09-01 DIAGNOSIS — J984 Other disorders of lung: Secondary | ICD-10-CM

## 2010-09-01 DIAGNOSIS — R1902 Left upper quadrant abdominal swelling, mass and lump: Secondary | ICD-10-CM

## 2010-09-01 DIAGNOSIS — R1904 Left lower quadrant abdominal swelling, mass and lump: Secondary | ICD-10-CM | POA: Insufficient documentation

## 2010-09-01 DIAGNOSIS — R519 Headache, unspecified: Secondary | ICD-10-CM

## 2010-09-01 DIAGNOSIS — R51 Headache: Secondary | ICD-10-CM | POA: Insufficient documentation

## 2010-09-05 NOTE — Assessment & Plan Note (Signed)
Low fat diet discussed and encouraged, labs past due

## 2010-09-05 NOTE — Assessment & Plan Note (Signed)
Unchanged, pain management through pain clinic 

## 2010-09-05 NOTE — Assessment & Plan Note (Signed)
abd scan to asses same

## 2010-09-05 NOTE — Assessment & Plan Note (Signed)
schedule f/u with cardiology , due

## 2010-09-05 NOTE — Assessment & Plan Note (Signed)
Based on history, MRI brain to be ordered

## 2010-09-05 NOTE — Assessment & Plan Note (Signed)
Carotid doppler studies scheduled

## 2010-09-05 NOTE — Assessment & Plan Note (Signed)
Controled , however pt on no stds dose of insulin, need rept lab to simplify and standardize treatment

## 2010-09-05 NOTE — Assessment & Plan Note (Signed)
Controlled, no change in medication  

## 2010-09-05 NOTE — Assessment & Plan Note (Signed)
Improved, no change in med

## 2010-09-06 ENCOUNTER — Telehealth: Payer: Self-pay | Admitting: *Deleted

## 2010-09-06 DIAGNOSIS — E109 Type 1 diabetes mellitus without complications: Secondary | ICD-10-CM

## 2010-09-06 NOTE — Telephone Encounter (Signed)
Lab ordered as requested by Dr Lodema Hong, patient aware

## 2010-09-17 ENCOUNTER — Other Ambulatory Visit: Payer: Self-pay | Admitting: Family Medicine

## 2010-09-17 DIAGNOSIS — R911 Solitary pulmonary nodule: Secondary | ICD-10-CM | POA: Insufficient documentation

## 2010-09-19 ENCOUNTER — Telehealth: Payer: Self-pay | Admitting: Family Medicine

## 2010-09-20 LAB — HEMOGLOBIN A1C: Hgb A1c MFr Bld: 6.2 % — ABNORMAL HIGH (ref <5.7)

## 2010-09-21 ENCOUNTER — Telehealth: Payer: Self-pay | Admitting: Family Medicine

## 2010-09-21 NOTE — Telephone Encounter (Signed)
CALLED PATIENT, NO ANSWER °

## 2010-09-21 NOTE — Telephone Encounter (Signed)
NOTED

## 2010-09-25 NOTE — Telephone Encounter (Signed)
Spoke with patient, patient aware of lab results

## 2010-10-17 ENCOUNTER — Telehealth: Payer: Self-pay | Admitting: Family Medicine

## 2010-10-18 NOTE — Telephone Encounter (Signed)
Call pt and document why she needs phenergan since not on regular med list pls

## 2010-10-18 NOTE — Telephone Encounter (Signed)
This is on med list but no dosage or instructions, ok to refill?

## 2010-10-19 NOTE — Telephone Encounter (Signed)
Chronic nausea and patient had a refill

## 2010-10-20 ENCOUNTER — Encounter (HOSPITAL_COMMUNITY): Payer: Self-pay | Admitting: Emergency Medicine

## 2010-10-20 ENCOUNTER — Inpatient Hospital Stay (HOSPITAL_COMMUNITY): Payer: Medicare Other

## 2010-10-20 ENCOUNTER — Emergency Department (HOSPITAL_COMMUNITY): Payer: Medicare Other

## 2010-10-20 ENCOUNTER — Inpatient Hospital Stay (HOSPITAL_COMMUNITY)
Admission: EM | Admit: 2010-10-20 | Discharge: 2010-10-21 | DRG: 313 | Disposition: A | Discharge status: Home / Self Care | Payer: Medicare Other | Attending: Internal Medicine | Admitting: Internal Medicine

## 2010-10-20 ENCOUNTER — Other Ambulatory Visit: Payer: Self-pay

## 2010-10-20 DIAGNOSIS — R079 Chest pain, unspecified: Secondary | ICD-10-CM | POA: Diagnosis present

## 2010-10-20 DIAGNOSIS — E039 Hypothyroidism, unspecified: Secondary | ICD-10-CM | POA: Diagnosis present

## 2010-10-20 DIAGNOSIS — R0789 Other chest pain: Principal | ICD-10-CM | POA: Diagnosis present

## 2010-10-20 DIAGNOSIS — R519 Headache, unspecified: Secondary | ICD-10-CM | POA: Diagnosis present

## 2010-10-20 DIAGNOSIS — R911 Solitary pulmonary nodule: Secondary | ICD-10-CM | POA: Diagnosis present

## 2010-10-20 DIAGNOSIS — E785 Hyperlipidemia, unspecified: Secondary | ICD-10-CM | POA: Diagnosis present

## 2010-10-20 DIAGNOSIS — E119 Type 2 diabetes mellitus without complications: Secondary | ICD-10-CM | POA: Diagnosis present

## 2010-10-20 DIAGNOSIS — J449 Chronic obstructive pulmonary disease, unspecified: Secondary | ICD-10-CM | POA: Diagnosis present

## 2010-10-20 DIAGNOSIS — M545 Low back pain, unspecified: Secondary | ICD-10-CM | POA: Diagnosis present

## 2010-10-20 DIAGNOSIS — E890 Postprocedural hypothyroidism: Secondary | ICD-10-CM | POA: Diagnosis present

## 2010-10-20 DIAGNOSIS — J4489 Other specified chronic obstructive pulmonary disease: Secondary | ICD-10-CM | POA: Diagnosis present

## 2010-10-20 DIAGNOSIS — G894 Chronic pain syndrome: Secondary | ICD-10-CM | POA: Diagnosis present

## 2010-10-20 DIAGNOSIS — M542 Cervicalgia: Secondary | ICD-10-CM | POA: Diagnosis present

## 2010-10-20 DIAGNOSIS — E109 Type 1 diabetes mellitus without complications: Secondary | ICD-10-CM | POA: Diagnosis present

## 2010-10-20 HISTORY — DX: Chronic obstructive pulmonary disease, unspecified: J44.9

## 2010-10-20 HISTORY — DX: Essential (primary) hypertension: I10

## 2010-10-20 LAB — LIPASE, BLOOD: Lipase: 11 U/L (ref 11–59)

## 2010-10-20 LAB — CBC
HCT: 35.6 % — ABNORMAL LOW (ref 36.0–46.0)
MCH: 29.3 pg (ref 26.0–34.0)
MCHC: 33.7 g/dL (ref 30.0–36.0)
MCV: 86.8 fL (ref 78.0–100.0)
RDW: 13.5 % (ref 11.5–15.5)

## 2010-10-20 LAB — BASIC METABOLIC PANEL
BUN: 14 mg/dL (ref 6–23)
Creatinine, Ser: 0.69 mg/dL (ref 0.50–1.10)
GFR calc Af Amer: >60 mL/min (ref >60)
GFR calc non Af Amer: >60 mL/min (ref >60)

## 2010-10-20 LAB — HEPATIC FUNCTION PANEL
AST: 17 U/L (ref 0–37)
Albumin: 3.4 g/dL — ABNORMAL LOW (ref 3.5–5.2)
Total Protein: 7 g/dL (ref 6.0–8.3)

## 2010-10-20 LAB — CARDIAC PANEL(CRET KIN+CKTOT+MB+TROPI): Relative Index: INVALID (ref 0.0–2.5)

## 2010-10-20 MED ORDER — FLUTICASONE PROPIONATE HFA 44 MCG/ACT IN AERO
1.0000 | INHALATION_SPRAY | Freq: Two times a day (BID) | RESPIRATORY_TRACT | Status: DC
  Administered 2010-10-21: 1 via RESPIRATORY_TRACT
  Filled 2010-10-20: qty 10.6

## 2010-10-20 MED ORDER — IPRATROPIUM-ALBUTEROL 18-103 MCG/ACT IN AERO
2.0000 | INHALATION_SPRAY | Freq: Three times a day (TID) | RESPIRATORY_TRACT | Status: DC
  Administered 2010-10-21 (×2): 2 via RESPIRATORY_TRACT
  Filled 2010-10-20: qty 14.7

## 2010-10-20 MED ORDER — LORAZEPAM 2 MG/ML IJ SOLN
1.0000 mg | Freq: Once | INTRAMUSCULAR | Status: AC
  Administered 2010-10-20: 1 mg via INTRAVENOUS

## 2010-10-20 MED ORDER — DOCUSATE SODIUM 100 MG PO CAPS
100.0000 mg | ORAL_CAPSULE | Freq: Two times a day (BID) | ORAL | Status: DC
  Administered 2010-10-20 – 2010-10-21 (×2): 100 mg via ORAL
  Filled 2010-10-20 (×2): qty 1

## 2010-10-20 MED ORDER — SODIUM CHLORIDE 0.9 % IV SOLN: 250.0000 mL | INTRAVENOUS | Status: DC

## 2010-10-20 MED ORDER — SODIUM CHLORIDE 0.9 % IJ SOLN
3.0000 mL | Freq: Two times a day (BID) | INTRAMUSCULAR | Status: DC
  Administered 2010-10-20 – 2010-10-21 (×2): 3 mL via INTRAVENOUS
  Filled 2010-10-20: qty 3

## 2010-10-20 MED ORDER — SENNA 8.6 MG PO TABS: 2.0000 | ORAL_TABLET | Freq: Every day | ORAL | Status: DC | PRN

## 2010-10-20 MED ORDER — HYDROMORPHONE HCL 4 MG PO TABS
4.0000 mg | ORAL_TABLET | Freq: Four times a day (QID) | ORAL | Status: DC | PRN
  Administered 2010-10-21: 4 mg via ORAL
  Filled 2010-10-20: qty 1

## 2010-10-20 MED ORDER — ESCITALOPRAM OXALATE 10 MG PO TABS
20.0000 mg | ORAL_TABLET | Freq: Every day | ORAL | Status: DC
  Administered 2010-10-21: 20 mg via ORAL
  Filled 2010-10-20: qty 2

## 2010-10-20 MED ORDER — ONDANSETRON HCL 4 MG PO TABS
4.0000 mg | ORAL_TABLET | Freq: Four times a day (QID) | ORAL | Status: DC | PRN
  Administered 2010-10-21: 4 mg via ORAL
  Filled 2010-10-20: qty 1

## 2010-10-20 MED ORDER — ONDANSETRON HCL 4 MG/2ML IJ SOLN: 4.0000 mg | Freq: Four times a day (QID) | INTRAMUSCULAR | Status: DC | PRN

## 2010-10-20 MED ORDER — FUROSEMIDE 40 MG PO TABS
40.0000 mg | ORAL_TABLET | Freq: Once | ORAL | Status: AC
  Administered 2010-10-20: 40 mg via ORAL

## 2010-10-20 MED ORDER — ENOXAPARIN SODIUM 40 MG/0.4ML ~~LOC~~ SOLN
40.0000 mg | SUBCUTANEOUS | Status: DC
  Administered 2010-10-20: 40 mg via SUBCUTANEOUS
  Filled 2010-10-20: qty 0.4

## 2010-10-20 MED ORDER — ALPRAZOLAM 1 MG PO TABS: 1.0000 mg | ORAL_TABLET | Freq: Every evening | ORAL | Status: DC | PRN

## 2010-10-20 MED ORDER — ENOXAPARIN SODIUM 40 MG/0.4ML ~~LOC~~ SOLN: 40.0000 mg | Freq: Two times a day (BID) | SUBCUTANEOUS | Status: DC

## 2010-10-20 MED ORDER — PROMETHAZINE HCL 25 MG/ML IJ SOLN
25.0000 mg | Freq: Once | INTRAMUSCULAR | Status: AC
  Administered 2010-10-20: 25 mg via INTRAVENOUS
  Filled 2010-10-20: qty 1

## 2010-10-20 MED ORDER — BIOTENE DRY MOUTH MT LIQD
Freq: Two times a day (BID) | OROMUCOSAL | Status: DC
  Administered 2010-10-20: 21:00:00 via OROMUCOSAL

## 2010-10-20 MED ORDER — PANTOPRAZOLE SODIUM 40 MG PO TBEC
40.0000 mg | DELAYED_RELEASE_TABLET | Freq: Every day | ORAL | Status: DC
  Administered 2010-10-20 – 2010-10-21 (×2): 40 mg via ORAL
  Filled 2010-10-20 (×2): qty 1

## 2010-10-20 MED ORDER — SODIUM CHLORIDE 0.9 % IJ SOLN: 3.0000 mL | INTRAMUSCULAR | Status: DC | PRN

## 2010-10-20 MED ORDER — MORPHINE SULFATE CR 15 MG PO TB12
15.0000 mg | ORAL_TABLET | Freq: Three times a day (TID) | ORAL | Status: DC
  Administered 2010-10-21 (×2): 15 mg via ORAL
  Filled 2010-10-20 (×3): qty 1

## 2010-10-20 MED ORDER — ACETAMINOPHEN 650 MG RE SUPP: 650.0000 mg | Freq: Four times a day (QID) | RECTAL | Status: DC | PRN

## 2010-10-20 MED ORDER — ALBUTEROL SULFATE (5 MG/ML) 0.5% IN NEBU: 2.5000 mg | INHALATION_SOLUTION | RESPIRATORY_TRACT | Status: DC | PRN

## 2010-10-20 MED ORDER — MORPHINE SULFATE CR 30 MG PO TB12
60.0000 mg | ORAL_TABLET | Freq: Three times a day (TID) | ORAL | Status: DC
  Administered 2010-10-21 (×2): 60 mg via ORAL
  Filled 2010-10-20 (×3): qty 2

## 2010-10-20 MED ORDER — INSULIN ASPART 100 UNIT/ML ~~LOC~~ SOLN: 0.0000 [IU] | Freq: Three times a day (TID) | SUBCUTANEOUS | Status: DC

## 2010-10-20 MED ORDER — POLYETHYLENE GLYCOL 3350 17 G PO PACK: 17.0000 g | PACK | Freq: Every day | ORAL | Status: DC | PRN

## 2010-10-20 MED ORDER — PROCHLORPERAZINE EDISYLATE 5 MG/ML IJ SOLN: 10.0000 mg | Freq: Once | INTRAMUSCULAR | Status: DC

## 2010-10-20 MED ORDER — ACETAMINOPHEN 325 MG PO TABS: 650.0000 mg | ORAL_TABLET | Freq: Four times a day (QID) | ORAL | Status: DC | PRN

## 2010-10-20 MED ORDER — ASPIRIN EC 81 MG PO TBEC
81.0000 mg | DELAYED_RELEASE_TABLET | Freq: Every day | ORAL | Status: DC
  Administered 2010-10-20: 81 mg via ORAL
  Filled 2010-10-20: qty 1

## 2010-10-20 MED ORDER — MORPHINE SULFATE 2 MG/ML IJ SOLN
2.0000 mg | INTRAMUSCULAR | Status: DC | PRN
  Administered 2010-10-20 – 2010-10-21 (×4): 2 mg via INTRAVENOUS
  Filled 2010-10-20 (×3): qty 1

## 2010-10-20 MED ORDER — INSULIN ASPART 100 UNIT/ML ~~LOC~~ SOLN: 0.0000 [IU] | Freq: Every day | SUBCUTANEOUS | Status: DC

## 2010-10-20 MED ORDER — TRAZODONE HCL 50 MG PO TABS: 100.0000 mg | ORAL_TABLET | Freq: Every evening | ORAL | Status: DC | PRN

## 2010-10-20 MED ORDER — FUROSEMIDE 40 MG PO TABS
ORAL_TABLET | ORAL | Status: AC
  Filled 2010-10-20: qty 1

## 2010-10-20 MED ORDER — INSULIN GLARGINE 100 UNIT/ML ~~LOC~~ SOLN: 10.0000 [IU] | Freq: Every day | SUBCUTANEOUS | Status: DC

## 2010-10-20 MED ORDER — LEVOTHYROXINE SODIUM 100 MCG PO TABS
200.0000 ug | ORAL_TABLET | Freq: Every day | ORAL | Status: DC
  Administered 2010-10-21: 200 ug via ORAL
  Filled 2010-10-20: qty 2

## 2010-10-20 MED ORDER — LORAZEPAM 2 MG/ML IJ SOLN
INTRAMUSCULAR | Status: AC
  Filled 2010-10-20: qty 1

## 2010-10-20 NOTE — ED Notes (Signed)
[facility] cardiology faxed over release of info paper at this time for a past visit with  Dr.Croitoru . [name] Fitzpatrick

## 2010-10-20 NOTE — ED Notes (Signed)
Patient with c/o chest pressure starting approximately 35 minutes ago. Patient reports chest pressure in left chest that radiates to jaw. + nausea. +Shortness of breath. Patient very anxious in triage. Patient reports history of HTN, high cholesterol. Skin warm/dry at present. Marland Kitchen

## 2010-10-20 NOTE — H&P (Signed)
[name] Fitzpatrick is an 55 y.o. female.  Her PCP is Dr. Syliva Overman. She is also followed by [facility] and vascular.  Chief Complaint: Chest pain  HPI: This is a 55 year old, obese, Caucasian female, with a past history of diabetes, who was in her usual state of health till about 3:30 in the afternoon today when she started having chest pain. This was described as a pressure-like sensation, like an elephant was sitting on her chest. The pain was 10 out of 10 in intensity. She tried taking a nitroglycerin spray, but did not relieve her pain. The pain was associated with shortness of breath. The pain radiated to her neck and her face. When the pain did not resolve she decided to come in to the hospital. She was brought in here by her husband. Currently, the pain is 5/10 in intensity. The pain also radiated to the right shoulder. She has nausea, but denies any emesis. She had palpitations. She felt weak, but denies any syncopal episode. She was also diaphoretic. She denies a history of heartburns. No history of cough. She does admit to some belching symptoms at times. She also has leg swelling on and off. Currently, she also is complaining of a severe headache, which is all over. Denies any focal weakness, denies any seizures denies any vision changes. The chest pain was at rest. There is no precipitating aggravating or relieving factors that are identified.  Please note that the last few medications on the list below are not being taken by the patient.  Prior to Admission medications   Medication Sig Start Date End Date Taking? Authorizing Provider  ALPRAZolam Prudy Feeler) 1 MG tablet Take 1 mg by mouth at bedtime as needed. Take one tablet by mouth three times a day    Yes Historical Provider, MD  aspirin (ASPIRIN LOW DOSE) 81 MG EC tablet Take 81 mg by mouth at bedtime. Take one tablet by mouth daily   Yes Historical Provider, MD  b complex vitamins tablet Take 1 tablet by mouth daily.      Yes Historical Provider, MD  Cholecalciferol (VITAMIN D) 1000 UNITS capsule Take 1,000 Units by mouth daily.     Yes Historical Provider, MD  CHROMIUM ASPARTATE PO Take 1 tablet by mouth daily.     Yes Historical Provider, MD  escitalopram (LEXAPRO) 20 MG tablet Take 20 mg by mouth daily.     Yes Historical Provider, MD  HYDROmorphone (DILAUDID) 4 MG tablet Take 4 mg by mouth every 6 (six) hours as needed. Take 1-2 tablets by mouth every 6 hours as needed    Yes Historical Provider, MD  insulin glargine (LANTUS) 100 UNIT/ML injection Inject 10 Units into the skin at bedtime. And per  Sliding scale   Yes Historical Provider, MD  insulin lispro (HUMALOG) 100 UNIT/ML injection Inject into the skin as directed. Per sliding scale three times a day with meals    Yes Historical Provider, MD  levothyroxine (SYNTHROID, LEVOTHROID) 200 MCG tablet Take 1 tablet (200 mcg total) by mouth daily. One tablet by mouth once daily 08/21/10  Yes Syliva Overman, MD  Morphine Sulfate (MS CONTIN PO) Take 75 mg by mouth 3 (three) times daily.     Yes Historical Provider, MD  Omega-3 Fatty Acids (FISH OIL BURP-LESS PO) Take 2-3 capsules by mouth daily.     Yes Historical Provider, MD  OVER THE COUNTER MEDICATION Take 3 tablets by mouth daily. CALCIUM/MAGNESIUM/ZINC COMBINATION TABLET. OTC    Yes  Historical Provider, MD  polyethylene glycol (MIRALAX / GLYCOLAX) packet Take 25 g by mouth daily as needed. Dissolved in water or juice as needed for constipation    Yes Historical Provider, MD  TraZODone HCl 150 MG TB24 Take by mouth. Take one tablet by mouth at bedtime    Yes Historical Provider, MD  albuterol-ipratropium (COMBIVENT) 18-103 MCG/ACT inhaler Inhale 2 puffs into the lungs 3 (three) times daily. Take two puffs by mouth three times a day     Historical Provider, MD  benzonatate (TESSALON PERLES) 100 MG capsule Take 100 mg by mouth 3 (three) times daily. Take 1 capsule by mouth three times a day     Historical Provider,  MD  budesonide-formoterol (SYMBICORT) 80-4.5 MCG/ACT inhaler Inhale 2 puffs into the lungs 2 (two) times daily. Two puffs twice daily     Historical Provider, MD  DOXYCYCLINE HYCLATE PO Take 100 mg by mouth. Take one capsule by mouth two times a day     Historical Provider, MD  fenofibrate micronized (LOFIBRA) 134 MG capsule Take 134 mg by mouth daily before breakfast. Take 1 a day     Historical Provider, MD  fluticasone (FLOVENT HFA) 44 MCG/ACT inhaler Inhale 1 puff into the lungs 2 (two) times daily. Two puffs two times a day     Historical Provider, MD  morphine (MS CONTIN) 100 MG 12 hr tablet Take 100 mg by mouth 2 (two) times daily. One tablet by mouth every 12 hours     Historical Provider, MD  PARoxetine (PAXIL) 20 MG tablet Take 20 mg by mouth every morning. One tablet by mouth once daily     Historical Provider, MD  Polyethylene Glycol 1450 LIQD by Does not apply route. Use 17 grams in 4-8 oz of fluid once a day by mouth     Historical Provider, MD  PROMETHAZINE HCL PO Take by mouth. Take 1 tablet by mouth once a day as needed for nausea     Historical Provider, MD  tiotropium (SPIRIVA) 18 MCG inhalation capsule Place 18 mcg into inhaler and inhale daily. One inhalation by mouth once a day     Historical Provider, MD    Allergies:  Allergies  Allergen Reactions  . Prednisone Shortness Of Breath and Nausea And Vomiting  . Penicillins Other (See Comments)    Pt states she almost died after taking the following. She states that she had Stroke like symptoms post taking the medication  . Cephalexin Hives, Swelling and Rash  . Latex Rash and Other (See Comments)    Causes skin to tear easily  . Metoclopramide Hcl Hives and Rash    Past Medical History  Diagnosis Date  . Anxiety   . Depression   . Hypothyroidism   . Osteoarthritis   . IBS (irritable bowel syndrome)     with constipation  . Fibromyalgia   . Chronic LBP   . Migraine headache   . Chronic neck pain   . DM type 2  (diabetes mellitus, type 2)   . Hypertension     Past Surgical History  Procedure Date  . Reconstucted right ankle   . C-spine fusion 1991, 2004  . Vesicovaginal fistula closure w/ tah 1993    Fibroids no Ca  . Right knee arthroscopic surgery   . B/l foot surgery --bunions   . L carpal tunnel release   . L spine fusion x 2   . Total reconstruction of right ankle and heel  total of three   . Cholecystectomy   . Abdominal hysterectomy     Social History:  reports that she has been smoking Cigarettes.  She has a 40 pack-year smoking history. She does not have any smokeless tobacco history on file. She reports that she does not drink alcohol. Her drug history not on file.  Family History:  Family History  Problem Relation Age of Onset  . Cancer Father     pancreatic   . Pancreatic cancer Father   . Heart failure Mother   . GI problems Mother   . Heart disease Mother   . Diabetes Mother   . Thyroid disease Sister   . Cancer Maternal Grandfather   . Cancer      family history     Review of Systems  Constitutional: Positive for diaphoresis. Negative for fever, chills, weight loss and malaise/fatigue.  HENT: Negative for hearing loss, ear pain, tinnitus and ear discharge.   Eyes: Negative.   Respiratory: Positive for cough. Negative for hemoptysis.   Cardiovascular: Positive for chest pain, palpitations and leg swelling.  Gastrointestinal: Positive for nausea and abdominal pain. Negative for heartburn and vomiting.  Genitourinary: Negative.   Musculoskeletal: Positive for back pain and joint pain.  Skin: Negative.   Neurological: Positive for weakness and headaches. Negative for dizziness, tingling, tremors and seizures.  Endo/Heme/Allergies: Negative.   Psychiatric/Behavioral: Positive for depression.    Blood pressure 99/65, pulse 68, temperature 97.7 F (36.5 C), temperature source Oral, resp. rate 16, height 5\' 4"  (1.626 m), weight 102.513 kg (226 lb), SpO2  98.00%. Physical Exam  Vitals reviewed. Constitutional: She is oriented to person, place, and time. She appears well-developed and well-nourished. No distress.  HENT:  Head: Normocephalic and atraumatic.  Nose: Nose normal.  Mouth/Throat: No oropharyngeal exudate.  Eyes: Conjunctivae and EOM are normal. Pupils are equal, round, and reactive to light. No scleral icterus.  Neck: Normal range of motion. No JVD present. No tracheal deviation present. No thyromegaly present.  Cardiovascular: Normal rate, regular rhythm, normal heart sounds and intact distal pulses.  Exam reveals no gallop and no friction rub.   No murmur heard. Pulmonary/Chest: No stridor.  Abdominal: Soft. Bowel sounds are normal. She exhibits no mass. There is no hepatosplenomegaly. There is tenderness in the right upper quadrant and epigastric area. There is no rebound, no guarding, no tenderness at McBurney's point and negative Murphy's sign. No hernia.    Lymphadenopathy:    She has no cervical adenopathy.  Neurological: She is alert and oriented to person, place, and time. No cranial nerve deficit. Coordination normal.  Skin: Skin is warm and dry. She is not diaphoretic. No erythema. No pallor.  Psychiatric: She has a normal mood and affect.     Results for orders placed during the hospital encounter of 10/20/10 (from the past 48 hour(s))  CBC     Status: Abnormal   Collection Time   10/20/10  4:18 PM      Component Value Range Comment   WBC 9.1  4.0 - 10.5 (K/uL)    RBC 4.10  3.87 - 5.11 (MIL/uL)    Hemoglobin 12.0  12.0 - 15.0 (g/dL)    HCT 16.1 (*) 09.6 - 46.0 (%)    MCV 86.8  78.0 - 100.0 (fL)    MCH 29.3  26.0 - 34.0 (pg)    MCHC 33.7  30.0 - 36.0 (g/dL)    RDW 04.5  40.9 - 81.1 (%)    Platelets 240  150 - 400 (K/uL)   BASIC METABOLIC PANEL     Status: Abnormal   Collection Time   10/20/10  4:18 PM      Component Value Range Comment   Sodium 138  135 - 145 (mEq/L)    Potassium 3.5  3.5 - 5.1 (mEq/L)     Chloride 99  96 - 112 (mEq/L)    CO2 24  19 - 32 (mEq/L)    Glucose, Bld 105 (*) 70 - 99 (mg/dL)    BUN 14  6 - 23 (mg/dL)    Creatinine, Ser 1.61  0.50 - 1.10 (mg/dL)    Calcium 09.6  8.4 - 10.5 (mg/dL)    GFR calc non Af Amer >60  >60 (mL/min)    GFR calc Af Amer >60  >60 (mL/min)   CARDIAC PANEL(CRET KIN+CKTOT+MB+TROPI)     Status: Normal   Collection Time   10/20/10  4:18 PM      Component Value Range Comment   Total CK 87  7 - 177 (U/L)    CK, MB 3.4  0.3 - 4.0 (ng/mL)    Troponin I <0.30  <0.30 (ng/mL)    Relative Index RELATIVE INDEX IS INVALID  0.0 - 2.5    PRO B NATRIURETIC PEPTIDE     Status: Abnormal   Collection Time   10/20/10  4:18 PM      Component Value Range Comment   BNP, POC 322.0 (*) 0 - 125 (pg/mL)   D-DIMER, QUANTITATIVE     Status: Normal   Collection Time   10/20/10  4:31 PM      Component Value Range Comment   D-Dimer, Quant 0.38  0.00 - 0.48 (ug/mL-FEU)    Dg Chest 2 View  10/20/2010  *RADIOLOGY REPORT*  Clinical Data: Chest pain and severe headache since this afternoon. History of COPD.  CHEST - 2 VIEW  Comparison: 02/04/2010, 09/01/2010  Findings: The film is made with shallow lung inflation.  Heart is enlarged. There is mild pulmonary vascular congestion.  Mild prominent interstitial markings.  There is minimal bilateral lower lobe atelectasis or scarring.  No definite consolidations.  No definite pleural effusions.  Degenerative changes are seen in the spine.  IMPRESSION: Cardiomegaly and pulmonary vascular congestion.  No focal acute pulmonary abnormality.  Original Report Authenticated By: Patterson Hammersmith, M.D.     Assessment/Plan  Principal Problem:  *Chest pain Active Problems:  HYPOTHYROIDISM  IDDM  HYPERLIPIDEMIA  NECK PAIN, CHRONIC  BACK PAIN, LUMBAR  Headache   So, we have a patient with diabetes, who is obese, who has history of hyperlipidemia, who presents with the sudden onset chest pain. Differential diagnoses include esophageal  spasm, coronary artery disease, GERD, biliary process. Considering a negative d-dimer, thromboembolic event is less likely. Interestingly, the patient told me that she was seen by [facility] cardiologist last week and underwent a stress test at that time. The results are not available. She also underwent a cardiac cath in 2006 which did not show any coronary artery disease.  Plan:   #1 chest pain: Patient will be admitted to the hospital to rule out for acute coronary syndrome by serial cardiac enzymes. We will consult [facility] cardiology to see her in the morning. Hopefully,they will be able to provide information regarding her recent stress test. Aspirin will be continued for now.   #2 upper abdominal pain: We'll check a lipase level. Will check LFTs. Ultrasound of the abdomen will be done as she did have some  tenderness in the right upper quarter. However, it should be noted that she is status post cholecystectomy. And a recent CT scan a few months ago, was unremarkable.  #3 diabetes continue with Lantus. Sliding scale insulin will be provided. HbA1c will be checked.  #4 chronic pain issues. Continue with her MS Contin.  #5 elevated BNP: She did get a dose of Lasix in the ED. At this time. I do not have a strong inclination to order more Lasix as her lungs are clear at this time. I'm also not ordering an echocardiogram until I know, that she's not had one recently. Hopefully, the cardiologists will be able to provide this information in the morning.   Further management decisions will depend on results of further testing and patient's response to treatment.  [name] Fitzpatrick 10/20/2010, 9:48 PM

## 2010-10-20 NOTE — ED Notes (Signed)
Pt states head and chest is "still hurting".  Hospitalist aware. Pt informed of admission to room 305. Pt's family member is off campus for dinner.

## 2010-10-20 NOTE — ED Provider Notes (Signed)
History     CSN: [id number] Arrival date & time: 10/20/2010  4:04 PM  Chief Complaint  Patient presents with  . Chest Pain   HPI Pt. w MMP (incl: chronic nausea, pain and HA) now p/w chest pressure.  She notes that the onset was subacute, ~5 hr pta (diff. From triage note).  Since onset there has been persistent anterior chest tightness.  No radiation, no exertional exacerbation, or other exacerbating factors. Sx not improved w anything.  She also c/o SOB, different from her typical (she has COPD).   No f/c, no recent illness, no sick contacts.  Notably, there patient has been in a R distal LE cast for ~36yr since an accident.  No Hx of PE, nor cardiac disease.  Past Medical History  Diagnosis Date  . Anxiety   . Depression   . Hypothyroidism   . Osteoarthritis   . IBS (irritable bowel syndrome)     with constipation  . Fibromyalgia   . Chronic LBP   . Migraine headache   . Chronic neck pain   . DM type 2 (diabetes mellitus, type 2)     Past Surgical History  Procedure Date  . Reconstucted right ankle   . C-spine fusion 1991, 2004  . Vesicovaginal fistula closure w/ tah 1993    Fibroids no Ca  . Right knee arthroscopic surgery   . B/l foot surgery --bunions   . L carpal tunnel release   . L spine fusion x 2   . Total reconstruction of right ankle and heel     total of three     Family History  Problem Relation Age of Onset  . Cancer Father     pancreatic   . Heart failure Mother   . GI problems Mother   . Diabetes Sister   . Thyroid disease Sister   . Cancer Maternal Grandfather   . Cancer      family history     History  Substance Use Topics  . Smoking status: Current Everyday Smoker -- 1.0 packs/day  . Smokeless tobacco: Not on file  . Alcohol Use: No    OB History    Grav Para Term Preterm Abortions TAB SAB Ect Mult Living                  Review of Systems  Constitutional: Negative for fever and chills.  HENT: Positive for congestion.   Eyes:  Negative.   Gastrointestinal: Positive for nausea.  Genitourinary: Negative for dysuria.  Musculoskeletal: Positive for myalgias.  Skin: Negative.   Neurological: Negative for dizziness and light-headedness.  Psychiatric/Behavioral: Positive for agitation.    Physical Exam  BP 110/74  Pulse 89  Temp(Src) 98.1 F (36.7 C) (Oral)  Resp 23  Ht 5\' 4"  (1.626 m)  Wt 214 lb (97.07 kg)  BMI 36.73 kg/m2  SpO2 94%  Physical Exam  Constitutional: She is oriented to person, place, and time. She appears well-developed and well-nourished.  HENT:  Head: Normocephalic and atraumatic.  Eyes: EOM are normal. Pupils are equal, round, and reactive to light.  Neck: Normal range of motion. Neck supple.  Cardiovascular: Normal rate.   Pulmonary/Chest: Breath sounds normal. No respiratory distress.  Abdominal: Soft. She exhibits no distension.  Musculoskeletal:       R ankle in cast - no appreciable edema in either ankle  Neurological: She is alert and oriented to person, place, and time.  Skin: Skin is warm and dry.  Psychiatric: She  has a normal mood and affect. Her behavior is normal.    Date: 10/20/2010  Rate: 91  Rhythm: normal sinus rhythm  QRS Axis: normal  Intervals: normal  ST/T Wave abnormalities: q-waves  Conduction Disutrbances:none  Narrative Interpretation:   Old EKG Reviewed: unchanged  chest X-ray +pulm congestion, +cadiomegaly -per myself and rads   ED Course  Procedures 1812: Patient's HA improved, but she remains nauseous. Additional meds provided.    MDM: Labs notable for elevated BNP, neg trop/CKMB/Dimer.  Patient's stress eval from last week d/w cardiologist ([facility]itor[facility])-low-risk study.  Prior films, including MR from 6/12 also reviewed.  Patient's presentation is c/w CHF exacerbation.  She will receive lasix, be admitted for further diuresis and e/m of new HF.        Gerhard Munch, MD 10/20/10 1900

## 2010-10-20 NOTE — ED Notes (Signed)
Patient placed on continuous cardiac monitoring, continuous pulse oximetry, and NBP cycling q 30 minutes.   Patient O2 sats 94 % on RA. Patient placed on 2 L [place], O2 sats now 97%.   Patient remains anxious.

## 2010-10-20 NOTE — ED Notes (Signed)
Pt transported to room 305 via stretcher by J. Wicker NT. Pt removed from cardiac monitor, and placed  On portable O2 at 2LPM.

## 2010-10-21 ENCOUNTER — Inpatient Hospital Stay (HOSPITAL_COMMUNITY): Payer: Medicare Other

## 2010-10-21 DIAGNOSIS — J449 Chronic obstructive pulmonary disease, unspecified: Secondary | ICD-10-CM | POA: Diagnosis present

## 2010-10-21 DIAGNOSIS — R911 Solitary pulmonary nodule: Secondary | ICD-10-CM | POA: Diagnosis present

## 2010-10-21 LAB — HEMOGLOBIN A1C: Mean Plasma Glucose: 120 mg/dL — ABNORMAL HIGH (ref <117)

## 2010-10-21 LAB — COMPREHENSIVE METABOLIC PANEL
ALT: 14 U/L (ref 0–35)
AST: 17 U/L (ref 0–37)
Calcium: 9.4 mg/dL (ref 8.4–10.5)
Creatinine, Ser: 0.63 mg/dL (ref 0.50–1.10)
GFR calc Af Amer: >60 mL/min (ref >60)
Glucose, Bld: 93 mg/dL (ref 70–99)
Sodium: 140 mEq/L (ref 135–145)
Total Protein: 6.5 g/dL (ref 6.0–8.3)

## 2010-10-21 LAB — CBC
HCT: 33.1 % — ABNORMAL LOW (ref 36.0–46.0)
Hemoglobin: 11.3 g/dL — ABNORMAL LOW (ref 12.0–15.0)
MCHC: 34.1 g/dL (ref 30.0–36.0)
RBC: 3.79 MIL/uL — ABNORMAL LOW (ref 3.87–5.11)

## 2010-10-21 LAB — GLUCOSE, CAPILLARY

## 2010-10-21 LAB — CARDIAC PANEL(CRET KIN+CKTOT+MB+TROPI)
CK, MB: 2.5 ng/mL (ref 0.3–4.0)
Relative Index: INVALID (ref 0.0–2.5)
Total CK: 61 U/L (ref 7–177)
Troponin I: <0.3 ng/mL (ref <0.30)
Troponin I: <0.3 ng/mL (ref <0.30)

## 2010-10-21 LAB — PRO B NATRIURETIC PEPTIDE: Pro B Natriuretic peptide (BNP): 234.2 pg/mL — ABNORMAL HIGH (ref 0–125)

## 2010-10-21 MED ORDER — POTASSIUM CHLORIDE CRYS ER 20 MEQ PO TBCR
20.0000 meq | EXTENDED_RELEASE_TABLET | Freq: Two times a day (BID) | ORAL | Status: DC
  Administered 2010-10-21: 20 meq via ORAL
  Filled 2010-10-21: qty 1

## 2010-10-21 MED ORDER — PROMETHAZINE HCL 12.5 MG PO TABS
25.0000 mg | ORAL_TABLET | Freq: Four times a day (QID) | ORAL | Status: DC | PRN
  Administered 2010-10-21: 25 mg via ORAL
  Filled 2010-10-21: qty 2

## 2010-10-21 MED ORDER — IOHEXOL 300 MG/ML  SOLN
100.0000 mL | Freq: Once | INTRAMUSCULAR | Status: AC | PRN
  Administered 2010-10-21: 100 mL via INTRAVENOUS

## 2010-10-21 NOTE — Discharge Summary (Addendum)
Physician Discharge Summary  [name] Fitzpatrick MRN: [id number] DOB/AGE: 1955/05/17 55 y.o.  PCP: Syliva Overman, MD   Admit date: 10/20/2010 Discharge date: 10/21/2010  Discharge Diagnoses:  1. Chest pain, myocardial infarction ruled out. CT angiogram of the chest, negative for pulmonary embolism. 2. 3 mm right middle lobe nodule, stable compared to the previous exam. Followup CT is recommended in 6-12 months. 3. COPD. 4. Insulin requiring diabetes mellitus. 5. Chronic pain syndrome with chronic neck pain and chronic lumbar back pain. 6. Hypothyroidism. .    Current Discharge Medication List    CONTINUE these medications which have NOT CHANGED   Details  ALPRAZolam (XANAX) 1 MG tablet Take 1 mg by mouth at bedtime as needed. Take one tablet by mouth three times a day     aspirin (ASPIRIN LOW DOSE) 81 MG EC tablet Take 81 mg by mouth at bedtime. Take one tablet by mouth daily    b complex vitamins tablet Take 1 tablet by mouth daily.      Cholecalciferol (VITAMIN D) 1000 UNITS capsule Take 1,000 Units by mouth daily.      CHROMIUM ASPARTATE PO Take 1 tablet by mouth daily.      escitalopram (LEXAPRO) 20 MG tablet Take 20 mg by mouth daily.      HYDROmorphone (DILAUDID) 4 MG tablet Take 4 mg by mouth every 6 (six) hours as needed. Take 1-2 tablets by mouth every 6 hours as needed     insulin glargine (LANTUS) 100 UNIT/ML injection Inject 10 Units into the skin at bedtime. And per  Sliding scale    insulin lispro (HUMALOG) 100 UNIT/ML injection Inject into the skin as directed. Per sliding scale three times a day with meals     levothyroxine (SYNTHROID, LEVOTHROID) 200 MCG tablet Take 1 tablet (200 mcg total) by mouth daily. One tablet by mouth once daily Qty: 30 tablet, Refills: 2    Morphine Sulfate (MS CONTIN PO) Take 75 mg by mouth 3 (three) times daily.      Omega-3 Fatty Acids (FISH OIL BURP-LESS PO) Take 2-3 capsules by mouth daily.      OVER THE COUNTER  MEDICATION Take 3 tablets by mouth daily. CALCIUM/MAGNESIUM/ZINC COMBINATION TABLET. OTC     polyethylene glycol (MIRALAX / GLYCOLAX) packet Take 25 g by mouth daily as needed. Dissolved in water or juice as needed for constipation     TraZODone HCl 150 MG TB24 Take by mouth. Take one tablet by mouth at bedtime     albuterol-ipratropium (COMBIVENT) 18-103 MCG/ACT inhaler Inhale 2 puffs into the lungs 3 (three) times daily. Take two puffs by mouth three times a day     fluticasone (FLOVENT HFA) 44 MCG/ACT inhaler Inhale 1 puff into the lungs 2 (two) times daily. Two puffs two times a day         Discharge Condition: Stable and improved.  Disposition: Home or Self Care   Consults: None.   Significant Diagnostic Studies: Dg Chest 2 View  10/20/2010  *RADIOLOGY REPORT*  Clinical Data: Chest pain and severe headache since this afternoon. History of COPD.  CHEST - 2 VIEW  Comparison: 02/04/2010, 09/01/2010  Findings: The film is made with shallow lung inflation.  Heart is enlarged. There is mild pulmonary vascular congestion.  Mild prominent interstitial markings.  There is minimal bilateral lower lobe atelectasis or scarring.  No definite consolidations.  No definite pleural effusions.  Degenerative changes are seen in the spine.  IMPRESSION: Cardiomegaly and pulmonary  vascular congestion.  No focal acute pulmonary abnormality.  Original Report Authenticated By: Patterson Hammersmith, M.D.   Ct Head Wo Contrast  10/20/2010  *RADIOLOGY REPORT*  Clinical Data: Severe headache  CT HEAD WITHOUT CONTRAST  Technique:  Contiguous axial images were obtained from the base of the skull through the vertex without contrast.  Comparison: MRI dated 09/01/2010  Findings: No evidence of parenchymal hemorrhage or extra-axial fluid collection. No mass lesion, mass effect, or midline shift.  No CT evidence of acute infarction.  Cerebral volume is age appropriate.  No ventriculomegaly.  Polyp versus mucous retention  cyst in the right maxillary sinus. Visualized paranasal sinuses otherwise clear.  No evidence of calvarial fracture.  IMPRESSION: Normal head CT.  Original Report Authenticated By: Charline Bills, M.D.   Ct Angio Chest W/cm &/or Wo Cm  10/21/2010  *RADIOLOGY REPORT*  Clinical Data:  Cough, shortness of breath, chest pain, assess for possible pulmonary embolism.  CT ANGIOGRAPHY CHEST WITH CONTRAST  Technique:  Multidetector CT imaging of the chest was performed using the standard protocol during bolus administration of intravenous contrast.  Multiplanar CT image reconstructions including MIPs were obtained to evaluate the vascular anatomy.  Contrast:  100 ml Omnipaque 300  Comparison:  Chest x-ray performed 10/20/2010 and CT thorax performed 09/01/2010.  Findings:  There are no filling defects in the pulmonary arterial system.  The thoracic aorta shows no dissection or dilatation. There is no evidence of infiltrate or effusion.  There are a few foci of discoid atelectasis in the mid to lower lung zones bilaterally.  There is a 3 mm pulmonary nodule in the right middle lobe on image number 46.  This is stable.  Review of the MIP images confirms the above findings.  IMPRESSION: No acute findings.  No pulmonary emboli. Stable pulmonary nodule as described previously for which follow-up CT thorax would not be recommended in 4-10 months.  Original Report Authenticated By: [id number]     Microbiology: No results found for this or any previous visit (from the past 240 hour(s)).   Labs: Results for orders placed during the hospital encounter of 10/20/10 (from the past 48 hour(s))  CBC     Status: Abnormal   Collection Time   10/20/10  4:18 PM      Component Value Range Comment   WBC 9.1  4.0 - 10.5 (K/uL)    RBC 4.10  3.87 - 5.11 (MIL/uL)    Hemoglobin 12.0  12.0 - 15.0 (g/dL)    HCT 04.5 (*) 40.9 - 46.0 (%)    MCV 86.8  78.0 - 100.0 (fL)    MCH 29.3  26.0 - 34.0 (pg)    MCHC 33.7  30.0 - 36.0 (g/dL)    RDW  81.1  91.4 - 78.2 (%)    Platelets 240  150 - 400 (K/uL)   BASIC METABOLIC PANEL     Status: Abnormal   Collection Time   10/20/10  4:18 PM      Component Value Range Comment   Sodium 138  135 - 145 (mEq/L)    Potassium 3.5  3.5 - 5.1 (mEq/L)    Chloride 99  96 - 112 (mEq/L)    CO2 24  19 - 32 (mEq/L)    Glucose, Bld 105 (*) 70 - 99 (mg/dL)    BUN 14  6 - 23 (mg/dL)    Creatinine, Ser 9.56  0.50 - 1.10 (mg/dL)    Calcium 21.3  8.4 - 10.5 (mg/dL)  GFR calc non Af Amer >60  >60 (mL/min)    GFR calc Af Amer >60  >60 (mL/min)   CARDIAC PANEL(CRET KIN+CKTOT+MB+TROPI)     Status: Normal   Collection Time   10/20/10  4:18 PM      Component Value Range Comment   Total CK 87  7 - 177 (U/L)    CK, MB 3.4  0.3 - 4.0 (ng/mL)    Troponin I <0.30  <0.30 (ng/mL)    Relative Index RELATIVE INDEX IS INVALID  0.0 - 2.5    PRO B NATRIURETIC PEPTIDE     Status: Abnormal   Collection Time   10/20/10  4:18 PM      Component Value Range Comment   BNP, POC 322.0 (*) 0 - 125 (pg/mL)   D-DIMER, QUANTITATIVE     Status: Normal   Collection Time   10/20/10  4:31 PM      Component Value Range Comment   D-Dimer, Quant 0.38  0.00 - 0.48 (ug/mL-FEU)   GLUCOSE, CAPILLARY     Status: Normal   Collection Time   10/20/10 10:05 PM      Component Value Range Comment   Glucose-Capillary 85  70 - 99 (mg/dL)   CARDIAC PANEL(CRET KIN+CKTOT+MB+TROPI)     Status: Normal   Collection Time   10/20/10 10:36 PM      Component Value Range Comment   Total CK 73  7 - 177 (U/L)    CK, MB 3.3  0.3 - 4.0 (ng/mL)    Troponin I <0.30  <0.30 (ng/mL)    Relative Index RELATIVE INDEX IS INVALID  0.0 - 2.5    HEPATIC FUNCTION PANEL     Status: Abnormal   Collection Time   10/20/10 10:37 PM      Component Value Range Comment   Total Protein 7.0  6.0 - 8.3 (g/dL)    Albumin 3.4 (*) 3.5 - 5.2 (g/dL)    AST 17  0 - 37 (U/L)    ALT 16  0 - 35 (U/L)    Alkaline Phosphatase 84  39 - 117 (U/L)    Total Bilirubin 0.2 (*) 0.3 - 1.2 (mg/dL)      Bilirubin, Direct 0.1  0.0 - 0.3 (mg/dL)    Indirect Bilirubin 0.1 (*) 0.3 - 0.9 (mg/dL)   LIPASE, BLOOD     Status: Normal   Collection Time   10/20/10 10:37 PM      Component Value Range Comment   Lipase 11  11 - 59 (U/L)   GLUCOSE, CAPILLARY     Status: Normal   Collection Time   10/21/10  7:44 AM      Component Value Range Comment   Glucose-Capillary 91  70 - 99 (mg/dL)    Comment 1 Notify RN      Comment 2 Documented in Chart     PRO B NATRIURETIC PEPTIDE     Status: Abnormal   Collection Time   10/21/10  7:45 AM      Component Value Range Comment   BNP, POC 234.2 (*) 0 - 125 (pg/mL)   COMPREHENSIVE METABOLIC PANEL     Status: Abnormal   Collection Time   10/21/10  7:45 AM      Component Value Range Comment   Sodium 140  135 - 145 (mEq/L)    Potassium 3.7  3.5 - 5.1 (mEq/L)    Chloride 99  96 - 112 (mEq/L)    CO2 31  19 -  32 (mEq/L)    Glucose, Bld 93  70 - 99 (mg/dL)    BUN 12  6 - 23 (mg/dL)    Creatinine, Ser 1.47  0.50 - 1.10 (mg/dL)    Calcium 9.4  8.4 - 10.5 (mg/dL)    Total Protein 6.5  6.0 - 8.3 (g/dL)    Albumin 3.1 (*) 3.5 - 5.2 (g/dL)    AST 17  0 - 37 (U/L)    ALT 14  0 - 35 (U/L)    Alkaline Phosphatase 80  39 - 117 (U/L)    Total Bilirubin 0.3  0.3 - 1.2 (mg/dL)    GFR calc non Af Amer >60  >60 (mL/min)    GFR calc Af Amer >60  >60 (mL/min)   CBC     Status: Abnormal   Collection Time   10/21/10  7:45 AM      Component Value Range Comment   WBC 6.2  4.0 - 10.5 (K/uL)    RBC 3.79 (*) 3.87 - 5.11 (MIL/uL)    Hemoglobin 11.3 (*) 12.0 - 15.0 (g/dL)    HCT 82.9 (*) 56.2 - 46.0 (%)    MCV 87.3  78.0 - 100.0 (fL)    MCH 29.8  26.0 - 34.0 (pg)    MCHC 34.1  30.0 - 36.0 (g/dL)    RDW 13.0  86.5 - 78.4 (%)    Platelets 194  150 - 400 (K/uL)   CARDIAC PANEL(CRET KIN+CKTOT+MB+TROPI)     Status: Normal   Collection Time   10/21/10 11:10 AM      Component Value Range Comment   Total CK 61  7 - 177 (U/L)    CK, MB 2.5  0.3 - 4.0 (ng/mL)    Troponin I <0.30  <0.30  (ng/mL)    Relative Index RELATIVE INDEX IS INVALID  0.0 - 2.5    GLUCOSE, CAPILLARY     Status: Abnormal   Collection Time   10/21/10 11:24 AM      Component Value Range Comment   Glucose-Capillary 125 (*) 70 - 99 (mg/dL)    Comment 1 Notify RN      Comment 2 Documented in Chart     GLUCOSE, CAPILLARY     Status: Abnormal   Collection Time   10/21/10 12:00 PM      Component Value Range Comment   Glucose-Capillary 115 (*) 70 - 99 (mg/dL)      HPI : Patient is a 55 year old woman with a past medical history significant for type 2 diabetes mellitus, chronic pain syndrome, and COPD, who presented to the emergency department on 10/20/2010 with a chief complaint of pressure-like chest pain. She had recently undergone a stress test by her cardiologist at [facility]. The results were unknown to the patient however, according to her, the emergency department physician was told that her stress test was unremarkable. This was not confirmed. In the emergency department, the patient was noted to be hemodynamically stable and afebrile. EKG revealed normal sinus rhythm without any ST or T wave abnormalities. Her chest x-ray revealed cardiomegaly and pulmonary vascular congestion but no acute focal pulmonary abnormality. Her BNP was marginally elevated at 322. D-dimer was within normal limits at 0.38. Her cardiac enzymes were within normal limits. She was admitted for observation and management.  HOSPITAL COURSE: The patient was was given Lasix by the emergency department physician. Lasix was not continued as it did not appear that she had decompensated congestive heart failure.  Analgesics were continued for pain management. Her chronic inhalers were continued as well. Proton pump inhibitor therapy was started empirically with Protonix. When she did not complain of any gastroesophageal reflux symptoms, Protonix was eventually discontinued upon discharge. Her serum potassium was  borderline low, and therefore, she was given potassium chloride supplementation orally. Her diabetes was well controlled and treated with sliding scale NovoLog and Lantus.  For further evaluation, cardiac enzymes and a CT angiogram of her chest were ordered. All of her cardiac enzymes were within normal limits and therefore she ruled out for myocardial infarction. The CT angiogram of her chest was negative for PE. It did reveal a stable small right middle lobe nodule which can be followed up on in 6-12 months.  The etiology of her chest pain was not clear. It did resolve prior to discharge. She is going to followup with her cardiologist next week for the definitive results of her recent stress test. Of note, she did have a cardiac catheterization in 2006 which revealed no evidence of coronary artery disease.    Discharge Exam:  Blood pressure 94/60, pulse 87, temperature 97.8 F (36.6 C), temperature source Oral, resp. rate 18, height 5\' 4"  (1.626 m), weight 102.513 kg (226 lb), SpO2 98.00%. General: The patient is currently laying in bed in no acute distress. She has no complaints of chest pain. Lungs: Occasional wheezes auscultated bilaterally. No crackles. Heart: S1-S2 with a soft systolic murmur. Abdomen: Obese, positive bowel sounds, nontender, nondistended, and no hepatosplenomegaly. Extremities: No pretibial edema and no pedal edema.     Discharge Orders    Future Appointments: Provider: Department: Dept Phone: Center:   12/07/2010 3:30 PM Syliva Overman, MD [facility] [phone] [facility]     Future Orders Please Complete By Expires   Diet Carb Modified      Increase activity slowly      Discharge instructions           Signed: Nicholaos Schippers 10/21/2010, 3:51 PM

## 2010-10-21 NOTE — Progress Notes (Signed)
Notified Dr Rito Ehrlich of pts EKG this AM-NSR with lateral and inferior-posterior infarcts age undetermined. Pt resting comfortably.

## 2010-10-21 NOTE — Progress Notes (Signed)
Pt discharged home today per Dr. Sherrie Mustache at [phone]. Pt's VS stable at this time. Pt's IV site D/C'd and WNL. Pt provided with home medication list and discharge instructions. Pt verbalized understanding. Pt left floor via WC accompanied by Winfield Cunas, NS in stable condition. Dagoberto Ligas, RN

## 2010-10-25 NOTE — Progress Notes (Signed)
Encounter addended by: Ree Shay, RN on: 10/25/2010  7:58 PM<BR>     Documentation filed: Charges VN

## 2010-11-16 ENCOUNTER — Other Ambulatory Visit: Payer: Self-pay | Admitting: Family Medicine

## 2010-12-06 ENCOUNTER — Encounter: Payer: Self-pay | Admitting: Family Medicine

## 2010-12-07 ENCOUNTER — Encounter: Payer: Self-pay | Admitting: Family Medicine

## 2010-12-07 ENCOUNTER — Ambulatory Visit (INDEPENDENT_AMBULATORY_CARE_PROVIDER_SITE_OTHER): Payer: Medicare Other | Admitting: Family Medicine

## 2010-12-07 VITALS — BP 110/80 | HR 79 | Resp 16 | Ht 62.5 in | Wt 220.0 lb

## 2010-12-07 DIAGNOSIS — E785 Hyperlipidemia, unspecified: Secondary | ICD-10-CM

## 2010-12-07 DIAGNOSIS — F329 Major depressive disorder, single episode, unspecified: Secondary | ICD-10-CM

## 2010-12-07 DIAGNOSIS — J449 Chronic obstructive pulmonary disease, unspecified: Secondary | ICD-10-CM

## 2010-12-07 DIAGNOSIS — E119 Type 2 diabetes mellitus without complications: Secondary | ICD-10-CM

## 2010-12-07 DIAGNOSIS — Z23 Encounter for immunization: Secondary | ICD-10-CM

## 2010-12-07 DIAGNOSIS — R109 Unspecified abdominal pain: Secondary | ICD-10-CM | POA: Insufficient documentation

## 2010-12-07 DIAGNOSIS — E039 Hypothyroidism, unspecified: Secondary | ICD-10-CM

## 2010-12-07 DIAGNOSIS — Z1211 Encounter for screening for malignant neoplasm of colon: Secondary | ICD-10-CM

## 2010-12-07 DIAGNOSIS — M542 Cervicalgia: Secondary | ICD-10-CM

## 2010-12-07 DIAGNOSIS — E109 Type 1 diabetes mellitus without complications: Secondary | ICD-10-CM

## 2010-12-07 DIAGNOSIS — E669 Obesity, unspecified: Secondary | ICD-10-CM

## 2010-12-07 MED ORDER — DICYCLOMINE HCL 10 MG PO CAPS: 10.0000 mg | ORAL_CAPSULE | Freq: Two times a day (BID) | ORAL | Status: DC

## 2010-12-07 MED ORDER — METFORMIN HCL 500 MG PO TABS: 500.0000 mg | ORAL_TABLET | Freq: Two times a day (BID) | ORAL | Status: DC

## 2010-12-07 MED ORDER — INFLUENZA VAC TYPES A & B PF IM SUSP: 0.5000 mL | Freq: Once | INTRAMUSCULAR | Status: DC

## 2010-12-07 NOTE — Progress Notes (Signed)
  Subjective:    Patient ID: [name] Fitzpatrick, female    DOB: [birth year], 55 y.o.   MRN: [id number]  HPI C/o abdominal pain, gassy, bloating after eating. Wants to resume bentyl and wants to see Dr Claudie Leach, also needs colonscopy Right ankle fusion, 4th operation on this joint is sched for Oct 3 Concerned about red spots, some of which blanche, currently will observe only. Reports fluctuation in blood sugars, still using short acting insulin on a sliding scale basis but very infrequently.    Review of Systems See HPI Denies recent fever or chills. Denies sinus pressure, nasal congestion, ear pain or sore throat. Denies chest congestion, productive cough or wheezing. Denies chest pains, palpitations and leg swelling   Denies dysuria, frequency, hesitancy or incontinence. Chronic back pain, uses a cane, followed at the pain clinic Denies headaches, seizures, numbness, or tingling. Denies depression, anxiety or insomnia.          Objective:   Physical Exam Patient alert and oriented and in no cardiopulmonary distress.  HEENT: No facial asymmetry, EOMI, no sinus tenderness,  oropharynx pink and moist.  Neck supple no adenopathy.  Chest: Clear to auscultation bilaterally.Decreased air entry throughout  CVS: S1, S2 no murmurs, no S3.  ABD: Soft non tender. Bowel sounds normal.  Ext: No edema  [id number]  Though  Adequate ROM spine, shoulders, hips and knees.  Skin: Intact, no ulcerations or rash noted.  Psych: Good eye contact, normal affect. Memory intact mildly  anxious or depressed appearing.  CNS: CN 2-12 intact, power, tone and sensation normal throughout.        Assessment & Plan:

## 2010-12-07 NOTE — Patient Instructions (Addendum)
F/U  In first week in January.  New medication , bentyl for abdominal cramps , and you will be referred to Dr Karilyn Cota for a January appointment.  Pls stop the sliding scale insulin.  New is metformin 500mg  one twice daily and lantus 10 units at night.  Call if blood sugars are high.   TdAP and flu vaccine today.  HBA1C and chem 7 today.TSH  Microalbumin to be submitted to lab as soon as possible   HBa1C and chem 7 non fasting in January. And TSH

## 2010-12-08 LAB — BASIC METABOLIC PANEL
BUN: 13 mg/dL (ref 6–23)
Calcium: 10 mg/dL (ref 8.4–10.5)
Creat: 0.89 mg/dL (ref 0.50–1.10)

## 2010-12-08 LAB — HEMOGLOBIN A1C
Hgb A1c MFr Bld: 5.3 % (ref <5.7)
Mean Plasma Glucose: 105 mg/dL (ref <117)

## 2010-12-08 NOTE — Progress Notes (Signed)
She wants to know if she is in the non diabetic range, why does she still need the metformin?

## 2010-12-13 LAB — POCT I-STAT, CHEM 8
Glucose, Bld: 113 — ABNORMAL HIGH
HCT: 40
Hemoglobin: 13.6
Potassium: 3.2 — ABNORMAL LOW
Sodium: 142
TCO2: 27

## 2010-12-17 NOTE — Assessment & Plan Note (Signed)
Diet controlled continue same

## 2010-12-17 NOTE — Assessment & Plan Note (Signed)
Needs updated lab data to determine control

## 2010-12-17 NOTE — Assessment & Plan Note (Signed)
Controlled, no change in medication  

## 2010-12-17 NOTE — Assessment & Plan Note (Signed)
Continues to deteriorate due to ongoing nicotine use

## 2010-12-17 NOTE — Assessment & Plan Note (Signed)
Pain management through clinic

## 2010-12-17 NOTE — Assessment & Plan Note (Signed)
C/o increased cramping abdominal [pain, med prescribed and referred to GI

## 2010-12-21 ENCOUNTER — Encounter (INDEPENDENT_AMBULATORY_CARE_PROVIDER_SITE_OTHER): Payer: Self-pay | Admitting: *Deleted

## 2011-01-02 ENCOUNTER — Telehealth: Payer: Self-pay | Admitting: Family Medicine

## 2011-01-03 NOTE — Telephone Encounter (Signed)
Needs to submit urine for analysis at lab and c/s first.

## 2011-01-03 NOTE — Telephone Encounter (Signed)
Has called twice and said that she just got out of [facility] and wants something for UTI. Said she cannot come in for OV

## 2011-01-04 MED ORDER — CIPROFLOXACIN HCL 500 MG PO TABS: 500.0000 mg | ORAL_TABLET | Freq: Two times a day (BID) | ORAL | Status: AC

## 2011-01-04 NOTE — Telephone Encounter (Signed)
Pt crying insisting cannot leave her house, cipro x 3 days will be prescribed , she is advised if symptoms worsen ABSOLUTELY needs clinical evaluation. I had advised she call the surgeon/hospital she justleft. Also requested med for nausea, I refused this , she needs to get from surgical team. pls erx cipro 500mg  one twice daily #6 only

## 2011-01-04 NOTE — Telephone Encounter (Signed)
Med sent in per Dr Lodema Hong

## 2011-01-04 NOTE — Telephone Encounter (Signed)
Extreme pain on urination and abdominal pain x 4 days

## 2011-01-08 ENCOUNTER — Ambulatory Visit (INDEPENDENT_AMBULATORY_CARE_PROVIDER_SITE_OTHER): Payer: Medicare Other | Admitting: Internal Medicine

## 2011-03-19 ENCOUNTER — Other Ambulatory Visit: Payer: Self-pay | Admitting: Family Medicine

## 2011-03-21 ENCOUNTER — Telehealth: Payer: Self-pay | Admitting: Family Medicine

## 2011-03-21 ENCOUNTER — Other Ambulatory Visit: Payer: Self-pay

## 2011-03-21 MED ORDER — PROMETHAZINE HCL 12.5 MG PO TABS: 25.0000 mg | ORAL_TABLET | Freq: Four times a day (QID) | ORAL | Status: DC | PRN

## 2011-03-21 NOTE — Telephone Encounter (Signed)
Refill x 2 please, and let her know

## 2011-03-21 NOTE — Telephone Encounter (Signed)
Called [facility] and confirmed dosage of last refill. Med refilled with 1 refill.  Pt aware.

## 2011-04-09 ENCOUNTER — Encounter: Payer: Self-pay | Admitting: Family Medicine

## 2011-04-11 ENCOUNTER — Ambulatory Visit: Payer: Medicare Other | Admitting: Family Medicine

## 2011-04-12 ENCOUNTER — Telehealth: Payer: Self-pay | Admitting: Family Medicine

## 2011-04-18 ENCOUNTER — Encounter: Payer: Self-pay | Admitting: Family Medicine

## 2011-04-18 ENCOUNTER — Telehealth: Payer: Self-pay | Admitting: Family Medicine

## 2011-04-18 ENCOUNTER — Ambulatory Visit (INDEPENDENT_AMBULATORY_CARE_PROVIDER_SITE_OTHER): Payer: Medicare Other | Admitting: Family Medicine

## 2011-04-18 ENCOUNTER — Other Ambulatory Visit: Payer: Self-pay

## 2011-04-18 ENCOUNTER — Encounter (INDEPENDENT_AMBULATORY_CARE_PROVIDER_SITE_OTHER): Payer: Self-pay | Admitting: *Deleted

## 2011-04-18 DIAGNOSIS — F329 Major depressive disorder, single episode, unspecified: Secondary | ICD-10-CM

## 2011-04-18 DIAGNOSIS — J449 Chronic obstructive pulmonary disease, unspecified: Secondary | ICD-10-CM

## 2011-04-18 DIAGNOSIS — F411 Generalized anxiety disorder: Secondary | ICD-10-CM

## 2011-04-18 DIAGNOSIS — R112 Nausea with vomiting, unspecified: Secondary | ICD-10-CM

## 2011-04-18 DIAGNOSIS — M545 Low back pain, unspecified: Secondary | ICD-10-CM

## 2011-04-18 DIAGNOSIS — E785 Hyperlipidemia, unspecified: Secondary | ICD-10-CM

## 2011-04-18 DIAGNOSIS — E039 Hypothyroidism, unspecified: Secondary | ICD-10-CM

## 2011-04-18 MED ORDER — LEVOTHYROXINE SODIUM 200 MCG PO TABS: 200.0000 ug | ORAL_TABLET | Freq: Every day | ORAL | Status: DC

## 2011-04-18 NOTE — Progress Notes (Signed)
  Subjective:    Patient ID: [name] Fitzpatrick, female    DOB: 08/31/1955, 56 y.o.   MRN: [id number]  HPI 6 month h/o GI symptoms which is worsening, bloating and , denies diarreah, denies fever and chills Pt denies polyuria , polydipsia and blurred vision, no longer on med for diabetes, due to normal HBa1C when last checked. Depression is stable and controlled, and her chronic pain management for back pain is unchanged. She gets this through a pain clinic C/o left breast lump at around 9 oclock, mammogram is past due and needs to be scheduled Review of Systems See HPI Denies recent fever or chills. Denies sinus pressure, nasal congestion, ear pain or sore throat. Denies chest congestion, productive cough or wheezing. Denies chest pains, palpitations and leg swelling  Denies dysuria, frequency, hesitancy or incontinence.  Denies headaches, seizures, numbness, or tingling. Denies uncontrolled depression, anxiety or insomnia. Denies skin break down or rash.        Objective:   Physical Exam Patient alert and oriented and in no cardiopulmonary distress.  HEENT: No facial asymmetry, EOMI, no sinus tenderness,  oropharynx pink and moist.  Neck supple no adenopathy.  Chest: Clear to auscultation bilaterally. Breast: no palpable lump in area of concern, but exposure was limited CVS: S1, S2 no murmurs, no S3.  ABD: Soft  No localized tenderness or guarding. Bowel sounds normal.  Ext: No edema  [id number]  ROM spine,adequate in  shoulders, hips and knees.  Skin: Intact, no ulcerations or rash noted.  Psych: Good eye contact, normal affect. Memory intact not anxious or depressed appearing.  CNS: CN 2-12 intact, power, normal throughout.        Assessment & Plan:

## 2011-04-18 NOTE — Patient Instructions (Signed)
F/u in 4 months.  You are referred to a stomach specialist, Dr Karilyn Cota.  Your mammogram will be schduled, this is past due and you report a lump on your left breast.  Labs as soon as possible, non fasting, CBc, tsh, vit D and HBa1C and H pylori

## 2011-04-18 NOTE — Assessment & Plan Note (Addendum)
Worsening  Symptoms , will refer to GI

## 2011-04-18 NOTE — Telephone Encounter (Signed)
Returned call to patient 04/16/11 - no answer.  FU with Loney Loh and Therapist, nutritional [facility]) advised patient must call their insurance and advise which insurance is primary and which is secondary - no coding error - only a confirmation of insurance.  JSH

## 2011-04-18 NOTE — Telephone Encounter (Signed)
Med sent in.

## 2011-04-22 ENCOUNTER — Encounter: Payer: Self-pay | Admitting: Family Medicine

## 2011-04-22 NOTE — Assessment & Plan Note (Signed)
Controlled, no change in medication  

## 2011-04-22 NOTE — Assessment & Plan Note (Signed)
Stable at this time 

## 2011-04-22 NOTE — Assessment & Plan Note (Signed)
Continue pain medication as before

## 2011-04-23 ENCOUNTER — Telehealth: Payer: Self-pay

## 2011-04-23 ENCOUNTER — Encounter (HOSPITAL_COMMUNITY): Payer: Self-pay

## 2011-04-23 ENCOUNTER — Encounter: Payer: Self-pay | Admitting: Family Medicine

## 2011-04-23 ENCOUNTER — Ambulatory Visit (INDEPENDENT_AMBULATORY_CARE_PROVIDER_SITE_OTHER): Payer: Medicare Other | Admitting: Family Medicine

## 2011-04-23 ENCOUNTER — Emergency Department (HOSPITAL_COMMUNITY)
Admission: EM | Admit: 2011-04-23 | Discharge: 2011-04-23 | Disposition: A | Discharge status: Home / Self Care | Payer: Medicare Other | Attending: Emergency Medicine | Admitting: Emergency Medicine

## 2011-04-23 VITALS — BP 104/74 | HR 89 | Resp 16

## 2011-04-23 DIAGNOSIS — J449 Chronic obstructive pulmonary disease, unspecified: Secondary | ICD-10-CM | POA: Insufficient documentation

## 2011-04-23 DIAGNOSIS — R109 Unspecified abdominal pain: Secondary | ICD-10-CM | POA: Insufficient documentation

## 2011-04-23 DIAGNOSIS — Z794 Long term (current) use of insulin: Secondary | ICD-10-CM | POA: Insufficient documentation

## 2011-04-23 DIAGNOSIS — F3289 Other specified depressive episodes: Secondary | ICD-10-CM | POA: Insufficient documentation

## 2011-04-23 DIAGNOSIS — M542 Cervicalgia: Secondary | ICD-10-CM | POA: Insufficient documentation

## 2011-04-23 DIAGNOSIS — M545 Low back pain, unspecified: Secondary | ICD-10-CM | POA: Insufficient documentation

## 2011-04-23 DIAGNOSIS — Z7982 Long term (current) use of aspirin: Secondary | ICD-10-CM | POA: Insufficient documentation

## 2011-04-23 DIAGNOSIS — J4489 Other specified chronic obstructive pulmonary disease: Secondary | ICD-10-CM | POA: Insufficient documentation

## 2011-04-23 DIAGNOSIS — I10 Essential (primary) hypertension: Secondary | ICD-10-CM | POA: Insufficient documentation

## 2011-04-23 DIAGNOSIS — G4700 Insomnia, unspecified: Secondary | ICD-10-CM | POA: Insufficient documentation

## 2011-04-23 DIAGNOSIS — IMO0001 Reserved for inherently not codable concepts without codable children: Secondary | ICD-10-CM | POA: Insufficient documentation

## 2011-04-23 DIAGNOSIS — F329 Major depressive disorder, single episode, unspecified: Secondary | ICD-10-CM | POA: Insufficient documentation

## 2011-04-23 DIAGNOSIS — F17200 Nicotine dependence, unspecified, uncomplicated: Secondary | ICD-10-CM | POA: Insufficient documentation

## 2011-04-23 DIAGNOSIS — G8929 Other chronic pain: Secondary | ICD-10-CM | POA: Insufficient documentation

## 2011-04-23 DIAGNOSIS — F411 Generalized anxiety disorder: Secondary | ICD-10-CM | POA: Insufficient documentation

## 2011-04-23 DIAGNOSIS — F32A Depression, unspecified: Secondary | ICD-10-CM

## 2011-04-23 DIAGNOSIS — R45851 Suicidal ideations: Secondary | ICD-10-CM

## 2011-04-23 DIAGNOSIS — G43909 Migraine, unspecified, not intractable, without status migrainosus: Secondary | ICD-10-CM | POA: Insufficient documentation

## 2011-04-23 DIAGNOSIS — E039 Hypothyroidism, unspecified: Secondary | ICD-10-CM | POA: Insufficient documentation

## 2011-04-23 DIAGNOSIS — K589 Irritable bowel syndrome without diarrhea: Secondary | ICD-10-CM | POA: Insufficient documentation

## 2011-04-23 DIAGNOSIS — E119 Type 2 diabetes mellitus without complications: Secondary | ICD-10-CM | POA: Insufficient documentation

## 2011-04-23 HISTORY — DX: Personal history of suicidal behavior: Z91.51

## 2011-04-23 HISTORY — DX: Personal history of self-harm: Z91.5

## 2011-04-23 LAB — CBC
MCH: 29.3 pg (ref 26.0–34.0)
Platelets: 198 10*3/uL (ref 150–400)
RBC: 3.75 MIL/uL — ABNORMAL LOW (ref 3.87–5.11)
WBC: 7.5 10*3/uL (ref 4.0–10.5)

## 2011-04-23 LAB — BASIC METABOLIC PANEL
BUN: 7 mg/dL (ref 6–23)
CO2: 29 mEq/L (ref 19–32)
Calcium: 9.7 mg/dL (ref 8.4–10.5)
Creatinine, Ser: 0.8 mg/dL (ref 0.50–1.10)

## 2011-04-23 LAB — ETHANOL: Alcohol, Ethyl (B): <11 mg/dL (ref 0–11)

## 2011-04-23 LAB — DIFFERENTIAL
Eosinophils Absolute: 0.6 10*3/uL (ref 0.0–0.7)
Lymphs Abs: 2.8 10*3/uL (ref 0.7–4.0)
Neutro Abs: 3.6 10*3/uL (ref 1.7–7.7)
Neutrophils Relative %: 47 % (ref 43–77)

## 2011-04-23 LAB — RAPID URINE DRUG SCREEN, HOSP PERFORMED
Amphetamines: NOT DETECTED
Opiates: POSITIVE — AB
Tetrahydrocannabinol: NOT DETECTED

## 2011-04-23 MED ORDER — NICOTINE 21 MG/24HR TD PT24: 21.0000 mg | MEDICATED_PATCH | Freq: Every day | TRANSDERMAL | Status: DC

## 2011-04-23 MED ORDER — LORAZEPAM 1 MG PO TABS: 1.0000 mg | ORAL_TABLET | ORAL | Status: DC | PRN

## 2011-04-23 MED ORDER — ASPIRIN EC 81 MG PO TBEC
81.0000 mg | DELAYED_RELEASE_TABLET | Freq: Every day | ORAL | Status: DC
  Filled 2011-04-23 (×2): qty 1

## 2011-04-23 MED ORDER — IBUPROFEN 400 MG PO TABS: 600.0000 mg | ORAL_TABLET | Freq: Three times a day (TID) | ORAL | Status: DC | PRN

## 2011-04-23 MED ORDER — HYDROMORPHONE HCL 2 MG PO TABS: 2.0000 mg | ORAL_TABLET | ORAL | Status: DC | PRN

## 2011-04-23 MED ORDER — ONDANSETRON HCL 4 MG PO TABS: 4.0000 mg | ORAL_TABLET | Freq: Three times a day (TID) | ORAL | Status: DC | PRN

## 2011-04-23 MED ORDER — ACETAMINOPHEN 325 MG PO TABS: 650.0000 mg | ORAL_TABLET | ORAL | Status: DC | PRN

## 2011-04-23 MED ORDER — ESCITALOPRAM OXALATE 10 MG PO TABS
20.0000 mg | ORAL_TABLET | Freq: Every day | ORAL | Status: DC
  Filled 2011-04-23 (×3): qty 2

## 2011-04-23 MED ORDER — BUPROPION HCL ER (SR) 150 MG PO TB12
300.0000 mg | ORAL_TABLET | Freq: Every day | ORAL | Status: DC
  Filled 2011-04-23 (×3): qty 2

## 2011-04-23 MED ORDER — LEVOTHYROXINE SODIUM 200 MCG PO TABS
200.0000 ug | ORAL_TABLET | Freq: Every day | ORAL | Status: DC
  Filled 2011-04-23 (×2): qty 1

## 2011-04-23 MED ORDER — FLUTICASONE PROPIONATE HFA 44 MCG/ACT IN AERO
INHALATION_SPRAY | RESPIRATORY_TRACT | Status: AC
  Filled 2011-04-23: qty 10.6

## 2011-04-23 MED ORDER — FLUTICASONE PROPIONATE HFA 44 MCG/ACT IN AERO
1.0000 | INHALATION_SPRAY | Freq: Two times a day (BID) | RESPIRATORY_TRACT | Status: DC
  Administered 2011-04-23: 1 via RESPIRATORY_TRACT
  Filled 2011-04-23: qty 10.6

## 2011-04-23 NOTE — Assessment & Plan Note (Signed)
Concern for worsening depression and passive suicidal ideation. Patient continues to state she can't take it anymore and she can't do it. Her husband is here today and states that she is not herself and he is very concerned about her mentally. I have called the emergency room and alert and there she will be coming for behavioral health evaluation and admission.
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
## 2011-04-23 NOTE — Patient Instructions (Signed)
Please go directly to [facility] The Emergency room physician will see you first and then the psychiatrist will be called

## 2011-04-23 NOTE — BH Assessment (Signed)
Assessment Note   [name] Fitzpatrick is an 56 y.o. female. Pt was sent to the er by her pcp due to depression and suicidal ideations. Pt reports she has several surgeries  to her rt foot and is always in pain. She has been put on new pain meds and she is having nightmares, talking to dead people in her dreams, having increased anxiety brought on by family members that causes problems for her and her husband.  She reports her thoughts have been more releated to if I were not here my husband would be able to get the money and solve some of their problems. Pt was  [name] Fitzpatrick  about not wanting to kill herself, had not considered a plan nor intent.  Pt admits to a suicide attempt about 25 years ago and does not even remember what was going on at that time. Pt husband at bedside and reports pt was more concerned about her meds today when she spoke with her doctor. Pt is willing to contract for safety. Pt was offered admission to the psych intensive program but pt declined due to the distance.  She agrees to follow up with her provider [name] Fitzpatrick for medication check. Discussed with Dr. Bebe Shaggy who will order a Tele-Psych to further determine pt's status.        Axis I: Depressive Disorder NOS Axis II: Deferred Axis III:  Past Medical History  Diagnosis Date  . Anxiety   . Depression   . Hypothyroidism   . Osteoarthritis   . IBS (irritable bowel syndrome)     with constipation  . Fibromyalgia   . Chronic LBP   . Migraine headache   . Chronic neck pain   . DM type 2 (diabetes mellitus, type 2)   . Hypertension   . COPD (chronic obstructive pulmonary disease)   . H/O suicide attempt    Axis IV: problems related to social environment Axis V: 51-60 moderate symptoms       Past Medical History:  Past Medical History  Diagnosis Date  . Anxiety   . Depression   . Hypothyroidism   . Osteoarthritis   . IBS (irritable bowel syndrome)     with constipation  . Fibromyalgia   .  Chronic LBP   . Migraine headache   . Chronic neck pain   . DM type 2 (diabetes mellitus, type 2)   . Hypertension   . COPD (chronic obstructive pulmonary disease)   . H/O suicide attempt     Past Surgical History  Procedure Date  . Reconstucted right ankle   . C-spine fusion 1991, 2004  . Vesicovaginal fistula closure w/ tah 1993    Fibroids no Ca  . Right knee arthroscopic surgery   . B/l foot surgery --bunions   . L carpal tunnel release   . L spine fusion x 2   . Total reconstruction of right ankle and heel     total of three   . Cholecystectomy   . Abdominal hysterectomy     Family History:  Family History  Problem Relation Age of Onset  . Cancer Father     pancreatic   . Pancreatic cancer Father   . Heart failure Mother   . GI problems Mother   . Heart disease Mother   . Diabetes Mother   . Thyroid disease Sister   . Cancer Maternal Grandfather   . Cancer      family history     Social History:  reports that she has been smoking Cigarettes.  She has a 40 pack-year smoking history. She does not have any smokeless tobacco history on file. She reports that she does not drink alcohol or use illicit drugs.  Additional Social History:    Allergies:  Allergies  Allergen Reactions  . Prednisone Shortness Of Breath and Nausea And Vomiting  . Penicillins Other (See Comments)    Pt states she almost died after taking the following. She states that she had Stroke like symptoms post taking the medication  . Cephalexin Hives, Swelling and Rash  . Latex Rash and Other (See Comments)    Causes skin to tear easily  . Metoclopramide Hcl Hives and Rash    Home Medications:  Medications Prior to Admission  Medication Dose Route Frequency Provider Last Rate Last Dose  . acetaminophen (TYLENOL) tablet 650 mg  650 mg Oral Q4H PRN Joya Gaskins, MD      . aspirin EC tablet 81 mg  81 mg Oral QHS Joya Gaskins, MD      . buPROPion [facility] SR) 12 hr tablet 300 mg   300 mg Oral Daily Joya Gaskins, MD      . escitalopram (LEXAPRO) tablet 20 mg  20 mg Oral Daily Joya Gaskins, MD      . fluticasone (FLOVENT HFA) 44 MCG/ACT inhaler 1 puff  1 puff Inhalation BID Joya Gaskins, MD      . HYDROmorphone (DILAUDID) tablet 2 mg  2 mg Oral Q4H PRN Joya Gaskins, MD      . ibuprofen (ADVIL,MOTRIN) tablet 600 mg  600 mg Oral Q8H PRN Joya Gaskins, MD      . Influenza (>/= 3 years) inactive virus vaccine (FLVIRIN/FLUZONE) injection SUSP 0.5 mL  0.5 mL Intramuscular Once Syliva Overman, MD      . levothyroxine (SYNTHROID, LEVOTHROID) tablet 200 mcg  200 mcg Oral QAC breakfast Joya Gaskins, MD      . LORazepam (ATIVAN) tablet 1 mg  1 mg Oral Q2H PRN Joya Gaskins, MD      . nicotine (NICODERM CQ - dosed in mg/24 hours) patch 21 mg  21 mg Transdermal Daily Joya Gaskins, MD      . ondansetron [facility]) tablet 4 mg  4 mg Oral Q8H PRN Joya Gaskins, MD       Medications Prior to Admission  Medication Sig Dispense Refill  . albuterol-ipratropium (COMBIVENT) 18-103 MCG/ACT inhaler Inhale 2 puffs into the lungs 3 (three) times daily. Take two puffs by mouth three times a day       . ALPRAZolam (XANAX) 1 MG tablet Take 1 mg by mouth at bedtime as needed. Take one tablet by mouth three times a day       . aspirin (ASPIRIN LOW DOSE) 81 MG EC tablet Take 81 mg by mouth at bedtime. Take one tablet by mouth daily      . b complex vitamins tablet Take 1 tablet by mouth daily.        Marland Kitchen buPROPion (WELLBUTRIN XL) 300 MG 24 hr tablet Take 300 mg by mouth daily.      . Cholecalciferol (VITAMIN D) 1000 UNITS capsule Take 1,000 Units by mouth daily.        . CHROMIUM ASPARTATE PO Take 1 tablet by mouth daily.        Marland Kitchen escitalopram (LEXAPRO) 20 MG tablet Take 20 mg by mouth daily.        Marland Kitchen  escitalopram (LEXAPRO) 20 MG tablet Take 20 mg by mouth daily.      . fluticasone (FLOVENT HFA) 44 MCG/ACT inhaler Inhale 1 puff into the lungs 2 (two) times daily.  Two puffs two times a day       . HYDROmorphone (DILAUDID) 4 MG tablet Take 4 mg by mouth every 4 (four) hours as needed.      . insulin glargine (LANTUS) 100 UNIT/ML injection Inject 10 Units into the skin at bedtime. And per  Sliding scale      . levothyroxine (SYNTHROID, LEVOTHROID) 200 MCG tablet Take 1 tablet (200 mcg total) by mouth daily.  30 tablet  3  . Morphine Sulfate (MS CONTIN PO) Take 75 mg by mouth 3 (three) times daily.        . Omega-3 Fatty Acids (FISH OIL BURP-LESS PO) Take 2-3 capsules by mouth daily.        Marland Kitchen OVER THE COUNTER MEDICATION Take 3 tablets by mouth daily. CALCIUM/MAGNESIUM/ZINC COMBINATION TABLET. OTC       . polyethylene glycol (MIRALAX / GLYCOLAX) packet Take 25 g by mouth daily as needed. Dissolved in water or juice as needed for constipation       . TraZODone HCl 150 MG TB24 Take by mouth. Take one tablet by mouth at bedtime         OB/GYN Status:  No LMP recorded. Patient has had a hysterectomy.  General Assessment Data Location of Assessment: [facility] ACT Assessment: Yes Living Arrangements: Spouse/significant other Can pt return to current living arrangement?: Yes Admission Status: Voluntary Is patient capable of signing voluntary admission?: Yes Transfer from: Home Referral Source: MD     Risk to self Suicidal Ideation: No Suicidal Intent: No Is patient at risk for suicide?: No Suicidal Plan?: No Access to Means: No What has been your use of drugs/alcohol within the last 12 months?: na Previous Attempts/Gestures: Yes How many times?: 1  Other Self Harm Risks: no Triggers for Past Attempts: Unknown (can't remember) Intentional Self Injurious Behavior: None Family Suicide History: No Recent stressful life event(s): Conflict (Comment);Recent negative physical changes (family pxs, increased pain, can't sleep ) Persecutory voices/beliefs?: No Depression: Yes Depression Symptoms: Insomnia;Tearfulness;Fatigue;Loss of interest in usual  pleasures Substance abuse history and/or treatment for substance abuse?: No Suicide prevention information given to non-admitted patients: Yes  Risk to Others Homicidal Ideation: No Thoughts of Harm to Others: No Current Homicidal Intent: No Current Homicidal Plan: No Access to Homicidal Means: No History of harm to others?: No Assessment of Violence: None Noted Does patient have access to weapons?: No Criminal Charges Pending?: No Does patient have a court date: No  Psychosis Hallucinations: None noted Delusions: None noted  Mental Status Report Appear/Hygiene: Improved Eye Contact: Good Motor Activity: Shuffling (with cane-foot surgery past november) Speech: Logical/coherent Level of Consciousness: Drowsy Mood: Depressed;Worthless, low self-esteem;Despair Affect: Depressed;Sad Anxiety Level: Minimal Thought Processes: Coherent;Relevant Judgement: Impaired Obsessive Compulsive Thoughts/Behaviors: None  Cognitive Functioning Concentration: Normal Memory: Recent Impaired;Remote Impaired IQ: Average Insight: Fair Impulse Control: Good Appetite: Poor Sleep: Decreased Total Hours of Sleep: 2  Vegetative Symptoms: None  Prior Inpatient Therapy Prior Inpatient Therapy: No  Prior Outpatient Therapy Prior Outpatient Therapy: Yes Prior Therapy Dates: currently Prior [facility] Reason for Treatment: depression            Values / Beliefs Cultural Requests During Hospitalization: None Spiritual Requests During Hospitalization: None        Additional Information 1:1 In Past 12  Months?: No CIRT Risk: No Elopement Risk: No Does patient have medical clearance?: Yes     Disposition: referred to current provider as pt refused psych-iop due to distance. Disposition Disposition of Patient: Treatment offered and refused Type of treatment offered and refused: Intensive outpatient (referred to current provifer)  On Site  Evaluation by:   Reviewed with Physician:     Hattie Perch Winford 04/23/2011 8:49 PM

## 2011-04-23 NOTE — ED Notes (Signed)
Samson Frederic from act in to evaluate patient

## 2011-04-23 NOTE — ED Provider Notes (Addendum)
History   This chart was scribed for Joya Gaskins, MD by Sofie Rower. The patient was seen in room [facility] and the patient's care was started at 6:20PM.    CSN: [id number]  Arrival date & time 04/23/11  1746   First MD Initiated Contact with Patient 04/23/11 1810      Chief Complaint  Patient presents with  . Medical Clearance     HPI Comments: Pt has a hx of COPD, bronchitis, and asthma.   Patient is a 56 y.o. female presenting with mental health disorder. The history is provided by the patient. No language interpreter was used.  Mental Health Problem Primary symptoms comment: depression. The current episode started this week. This is a new problem.  The degree of incapacity that she is experiencing as a consequence of her illness is moderate. Additional symptoms of the illness include insomnia and abdominal pain. Additional symptoms of the illness do not include no headaches. Associated symptoms comments: Shortness of breath. Loss of sleep.. She does not admit to suicidal ideas. She does not have a plan to commit suicide. She does not contemplate harming herself. She has not already injured self. She does not contemplate injuring another person. She has not already  injured another person.  pt with recent SOB but has h/o COPD, now improved No active CP at this time Reports "pain all over"  No abd pain at this time  Past Medical History  Diagnosis Date  . Anxiety   . Depression   . Hypothyroidism   . Osteoarthritis   . IBS (irritable bowel syndrome)     with constipation  . Fibromyalgia   . Chronic LBP   . Migraine headache   . Chronic neck pain   . DM type 2 (diabetes mellitus, type 2)   . Hypertension   . COPD (chronic obstructive pulmonary disease)   . H/O suicide attempt     Past Surgical History  Procedure Date  . Reconstucted right ankle   . C-spine fusion 1991, 2004  . Vesicovaginal fistula closure w/ tah 1993    Fibroids no Ca  . Right knee  arthroscopic surgery   . B/l foot surgery --bunions   . L carpal tunnel release   . L spine fusion x 2   . Total reconstruction of right ankle and heel     total of three   . Cholecystectomy   . Abdominal hysterectomy     Family History  Problem Relation Age of Onset  . Cancer Father     pancreatic   . Pancreatic cancer Father   . Heart failure Mother   . GI problems Mother   . Heart disease Mother   . Diabetes Mother   . Thyroid disease Sister   . Cancer Maternal Grandfather   . Cancer      family history     History  Substance Use Topics  . Smoking status: Current Everyday Smoker -- 1.0 packs/day for 40 years    Types: Cigarettes  . Smokeless tobacco: Not on file  . Alcohol Use: No    OB History    Grav Para Term Preterm Abortions TAB SAB Ect Mult Living                  Review of Systems  Gastrointestinal: Positive for abdominal pain.  Neurological: Negative for headaches.  Psychiatric/Behavioral: The patient has insomnia.   All other systems reviewed and are negative.    Allergies  Prednisone; Penicillins;  Cephalexin; Latex; and Metoclopramide hcl  Home Medications   Current Outpatient Rx  Name Route Sig Dispense Refill  . IPRATROPIUM-ALBUTEROL 18-103 MCG/ACT IN AERO Inhalation Inhale 2 puffs into the lungs 3 (three) times daily. Take two puffs by mouth three times a day     . ALPRAZOLAM 1 MG PO TABS Oral Take 1 mg by mouth at bedtime as needed. Take one tablet by mouth three times a day     . ASPIRIN 81 MG PO TBEC Oral Take 81 mg by mouth at bedtime. Take one tablet by mouth daily    . B COMPLEX PO TABS Oral Take 1 tablet by mouth daily.      . BUPROPION HCL ER (XL) 300 MG PO TB24 Oral Take 300 mg by mouth daily.    Marland Kitchen VITAMIN D 1000 UNITS PO CAPS Oral Take 1,000 Units by mouth daily.      . CHROMIUM ASPARTATE PO Oral Take 1 tablet by mouth daily.      Marland Kitchen ESCITALOPRAM OXALATE 20 MG PO TABS Oral Take 20 mg by mouth daily.      Marland Kitchen ESCITALOPRAM OXALATE 20  MG PO TABS Oral Take 20 mg by mouth daily.    Marland Kitchen FLUTICASONE PROPIONATE  HFA 44 MCG/ACT IN AERO Inhalation Inhale 1 puff into the lungs 2 (two) times daily. Two puffs two times a day     . HYDROMORPHONE HCL 4 MG PO TABS Oral Take 4 mg by mouth every 4 (four) hours as needed.    . INSULIN GLARGINE 100 UNIT/ML [place] SOLN Subcutaneous Inject 10 Units into the skin at bedtime. And per  Sliding scale    . LEVOTHYROXINE SODIUM 200 MCG PO TABS Oral Take 1 tablet (200 mcg total) by mouth daily. 30 tablet 3  . MS CONTIN PO Oral Take 75 mg by mouth 3 (three) times daily.      Marland Kitchen FISH OIL BURP-LESS PO Oral Take 2-3 capsules by mouth daily.      Marland Kitchen OVER THE COUNTER MEDICATION Oral Take 3 tablets by mouth daily. CALCIUM/MAGNESIUM/ZINC COMBINATION TABLET. OTC     . POLYETHYLENE GLYCOL 3350 PO PACK Oral Take 25 g by mouth daily as needed. Dissolved in water or juice as needed for constipation     . TRAZODONE HCL ER 150 MG PO TB24 Oral Take by mouth. Take one tablet by mouth at bedtime       BP 113/87  Pulse 87  Temp(Src) 98 F (36.7 C) (Oral)  Resp 20  Ht 5' 4.5" (1.638 m)  Wt 212 lb (96.163 kg)  BMI 35.83 kg/m2  SpO2 95%  Physical Exam  CONSTITUTIONAL: Well developed/well nourished HEAD AND FACE: Normocephalic/atraumatic EYES: EOMI/PERRL ENMT: Mucous membranes moist NECK: supple no meningeal signs SPINE:entire spine nontender CV: S1/S2 noted, no murmurs/rubs/gallops noted LUNGS: Lungs are clear to auscultation bilaterally, no apparent distress ABDOMEN: soft, nontender, no rebound or guarding GU:no cva tenderness NEURO: Pt is awake/alert, moves all extremitiesx4 EXTREMITIES: pulses normal, full ROM SKIN: warm, color normal PSYCH: Flat affect.    ED Course  Procedures  Results for orders placed during the hospital encounter of 04/23/11  CBC      Component Value Range   WBC 7.5  4.0 - 10.5 (K/uL)   RBC 3.75 (*) 3.87 - 5.11 (MIL/uL)   Hemoglobin 11.0 (*) 12.0 - 15.0 (g/dL)   HCT 16.1 (*)  09.6 - 46.0 (%)   MCV 86.9  78.0 - 100.0 (fL)   MCH 29.3  26.0 - 34.0 (pg)   MCHC 33.7  30.0 - 36.0 (g/dL)   RDW 16.1  09.6 - 04.5 (%)   Platelets 198  150 - 400 (K/uL)  DIFFERENTIAL      Component Value Range   Neutrophils Relative 47  43 - 77 (%)   Neutro Abs 3.6  1.7 - 7.7 (K/uL)   Lymphocytes Relative 38  12 - 46 (%)   Lymphs Abs 2.8  0.7 - 4.0 (K/uL)   Monocytes Relative 6  3 - 12 (%)   Monocytes Absolute 0.5  0.1 - 1.0 (K/uL)   Eosinophils Relative 8 (*) 0 - 5 (%)   Eosinophils Absolute 0.6  0.0 - 0.7 (K/uL)   Basophils Relative 1  0 - 1 (%)   Basophils Absolute 0.1  0.0 - 0.1 (K/uL)  URINE RAPID DRUG SCREEN (HOSP PERFORMED)      Component Value Range   Opiates POSITIVE (*) NONE DETECTED    Cocaine NONE DETECTED  NONE DETECTED    Benzodiazepines POSITIVE (*) NONE DETECTED    Amphetamines NONE DETECTED  NONE DETECTED    Tetrahydrocannabinol NONE DETECTED  NONE DETECTED    Barbiturates NONE DETECTED  NONE DETECTED         6:26PM- EDP at bedside discusses treatment plan concerning contacting a behavioral specialist. 8:01 PM ACT here to see patient  MDM  Nursing notes reviewed and considered in documentation All labs/vitals reviewed and considered      I personally performed the services described in this documentation, which was scribed in my presence. The recorded information has been reviewed and considered.       Joya Gaskins, MD 04/23/11 2001  11:08 PM Seen by ACT And telepsych Deemed safe for d/c, denied SI Referred back to SI Pt appropriate for d/c home Advised to cut back on her pain meds due to sedation BP 113/87  Pulse 87  Temp(Src) 98 F (36.7 C) (Oral)  Resp 20  Ht 5' 4.5" (1.638 m)  Wt 212 lb (96.163 kg)  BMI 35.83 kg/m2  SpO2 94%   Joya Gaskins, MD 04/23/11 2309

## 2011-04-23 NOTE — Telephone Encounter (Signed)
Pt evaluated in office by Dr Jeanice Lim sent to ed , feels she needs psych admission

## 2011-04-23 NOTE — Assessment & Plan Note (Signed)
Depression has deteriorated with recent events. She is followed by Dr. Henreitta Leber in [place] per report. Will defer to inpatient psychiatry for management of medications.

## 2011-04-23 NOTE — Telephone Encounter (Signed)
Spoke with patient and she was crying so bad I could hardly understand her. All I could make out was she "couldn't take it anymore" I understood her to say that her husband had been accused by her son of something he did not do and there was a warrant out. She does not handle stress and she could not do this anymore. I offered her an appt for in the am but she said that was too long. Spoke with Dr Jeanice Lim and she said to tell the patient to come now. Pt said she would try. Advised to call if she was coming so we would be expecting her. Said she was coming

## 2011-04-23 NOTE — ED Notes (Signed)
"  im so depressed", I feel like a piece of furniture sitting at house", husband doesn't give me any time.  Denies si/hi.  Tearful in triage, just keeps repeating my family.  "I can't do anymore for my mother and im ashamed of myself".

## 2011-05-03 ENCOUNTER — Ambulatory Visit (INDEPENDENT_AMBULATORY_CARE_PROVIDER_SITE_OTHER): Payer: Medicare Other | Admitting: Internal Medicine

## 2011-05-11 ENCOUNTER — Other Ambulatory Visit: Payer: Self-pay | Admitting: Family Medicine

## 2011-05-11 DIAGNOSIS — N63 Unspecified lump in unspecified breast: Secondary | ICD-10-CM

## 2011-05-11 DIAGNOSIS — Z139 Encounter for screening, unspecified: Secondary | ICD-10-CM

## 2011-05-15 ENCOUNTER — Ambulatory Visit (HOSPITAL_COMMUNITY): Payer: Medicare Other

## 2011-07-01 ENCOUNTER — Inpatient Hospital Stay (HOSPITAL_COMMUNITY)
Admission: AD | Admit: 2011-07-01 | Discharge: 2011-07-13 | DRG: 885 | Disposition: A | Discharge status: Home / Self Care | Payer: No Typology Code available for payment source | Source: Other Acute Inpatient Hospital | Attending: Psychiatry | Admitting: Psychiatry

## 2011-07-01 ENCOUNTER — Emergency Department (HOSPITAL_COMMUNITY): Payer: Medicare Other

## 2011-07-01 ENCOUNTER — Emergency Department (HOSPITAL_COMMUNITY)
Admission: EM | Admit: 2011-07-01 | Discharge: 2011-07-01 | Disposition: A | Discharge status: Other Acute Inpatient Hospital | Payer: Medicare Other | Attending: Emergency Medicine | Admitting: Emergency Medicine

## 2011-07-01 ENCOUNTER — Encounter (HOSPITAL_COMMUNITY): Payer: Self-pay | Admitting: *Deleted

## 2011-07-01 ENCOUNTER — Encounter (HOSPITAL_COMMUNITY): Payer: Self-pay | Admitting: Emergency Medicine

## 2011-07-01 DIAGNOSIS — M7989 Other specified soft tissue disorders: Secondary | ICD-10-CM | POA: Insufficient documentation

## 2011-07-01 DIAGNOSIS — I10 Essential (primary) hypertension: Secondary | ICD-10-CM | POA: Insufficient documentation

## 2011-07-01 DIAGNOSIS — R51 Headache: Secondary | ICD-10-CM | POA: Insufficient documentation

## 2011-07-01 DIAGNOSIS — F1994 Other psychoactive substance use, unspecified with psychoactive substance-induced mood disorder: Secondary | ICD-10-CM | POA: Diagnosis present

## 2011-07-01 DIAGNOSIS — F332 Major depressive disorder, recurrent severe without psychotic features: Principal | ICD-10-CM

## 2011-07-01 DIAGNOSIS — F3289 Other specified depressive episodes: Secondary | ICD-10-CM | POA: Insufficient documentation

## 2011-07-01 DIAGNOSIS — F32A Depression, unspecified: Secondary | ICD-10-CM

## 2011-07-01 DIAGNOSIS — M542 Cervicalgia: Secondary | ICD-10-CM | POA: Diagnosis present

## 2011-07-01 DIAGNOSIS — R079 Chest pain, unspecified: Secondary | ICD-10-CM | POA: Insufficient documentation

## 2011-07-01 DIAGNOSIS — R0989 Other specified symptoms and signs involving the circulatory and respiratory systems: Secondary | ICD-10-CM | POA: Insufficient documentation

## 2011-07-01 DIAGNOSIS — IMO0001 Reserved for inherently not codable concepts without codable children: Secondary | ICD-10-CM | POA: Diagnosis present

## 2011-07-01 DIAGNOSIS — M1990 Unspecified osteoarthritis, unspecified site: Secondary | ICD-10-CM | POA: Diagnosis present

## 2011-07-01 DIAGNOSIS — F0781 Postconcussional syndrome: Secondary | ICD-10-CM

## 2011-07-01 DIAGNOSIS — R45851 Suicidal ideations: Secondary | ICD-10-CM | POA: Insufficient documentation

## 2011-07-01 DIAGNOSIS — M25519 Pain in unspecified shoulder: Secondary | ICD-10-CM | POA: Insufficient documentation

## 2011-07-01 DIAGNOSIS — F411 Generalized anxiety disorder: Secondary | ICD-10-CM

## 2011-07-01 DIAGNOSIS — E119 Type 2 diabetes mellitus without complications: Secondary | ICD-10-CM | POA: Diagnosis present

## 2011-07-01 DIAGNOSIS — R55 Syncope and collapse: Secondary | ICD-10-CM | POA: Insufficient documentation

## 2011-07-01 DIAGNOSIS — W19XXXA Unspecified fall, initial encounter: Secondary | ICD-10-CM | POA: Insufficient documentation

## 2011-07-01 DIAGNOSIS — Z794 Long term (current) use of insulin: Secondary | ICD-10-CM

## 2011-07-01 DIAGNOSIS — G8929 Other chronic pain: Secondary | ICD-10-CM | POA: Diagnosis present

## 2011-07-01 DIAGNOSIS — R42 Dizziness and giddiness: Secondary | ICD-10-CM | POA: Diagnosis not present

## 2011-07-01 DIAGNOSIS — E039 Hypothyroidism, unspecified: Secondary | ICD-10-CM | POA: Diagnosis present

## 2011-07-01 DIAGNOSIS — F4310 Post-traumatic stress disorder, unspecified: Secondary | ICD-10-CM

## 2011-07-01 DIAGNOSIS — F329 Major depressive disorder, single episode, unspecified: Secondary | ICD-10-CM | POA: Insufficient documentation

## 2011-07-01 DIAGNOSIS — K589 Irritable bowel syndrome without diarrhea: Secondary | ICD-10-CM | POA: Diagnosis present

## 2011-07-01 DIAGNOSIS — G43909 Migraine, unspecified, not intractable, without status migrainosus: Secondary | ICD-10-CM | POA: Diagnosis present

## 2011-07-01 DIAGNOSIS — Z4789 Encounter for other orthopedic aftercare: Secondary | ICD-10-CM

## 2011-07-01 DIAGNOSIS — M545 Low back pain, unspecified: Secondary | ICD-10-CM | POA: Diagnosis present

## 2011-07-01 DIAGNOSIS — J4489 Other specified chronic obstructive pulmonary disease: Secondary | ICD-10-CM | POA: Diagnosis present

## 2011-07-01 DIAGNOSIS — S2239XA Fracture of one rib, unspecified side, initial encounter for closed fracture: Secondary | ICD-10-CM | POA: Insufficient documentation

## 2011-07-01 DIAGNOSIS — J449 Chronic obstructive pulmonary disease, unspecified: Secondary | ICD-10-CM | POA: Diagnosis present

## 2011-07-01 DIAGNOSIS — R5381 Other malaise: Secondary | ICD-10-CM | POA: Diagnosis present

## 2011-07-01 DIAGNOSIS — R0609 Other forms of dyspnea: Secondary | ICD-10-CM | POA: Insufficient documentation

## 2011-07-01 DIAGNOSIS — S2249XA Multiple fractures of ribs, unspecified side, initial encounter for closed fracture: Secondary | ICD-10-CM

## 2011-07-01 DIAGNOSIS — F17200 Nicotine dependence, unspecified, uncomplicated: Secondary | ICD-10-CM | POA: Diagnosis present

## 2011-07-01 LAB — CBC
MCV: 86.9 fL (ref 78.0–100.0)
Platelets: 251 10*3/uL (ref 150–400)
RBC: 4.12 MIL/uL (ref 3.87–5.11)
RDW: 13.2 % (ref 11.5–15.5)
WBC: 8.5 10*3/uL (ref 4.0–10.5)

## 2011-07-01 LAB — COMPREHENSIVE METABOLIC PANEL
ALT: 22 U/L (ref 0–35)
AST: 16 U/L (ref 0–37)
Albumin: 3.8 g/dL (ref 3.5–5.2)
Alkaline Phosphatase: 91 U/L (ref 39–117)
CO2: 29 mEq/L (ref 19–32)
Chloride: 102 mEq/L (ref 96–112)
Creatinine, Ser: 0.78 mg/dL (ref 0.50–1.10)
GFR calc non Af Amer: >90 mL/min (ref >90)
Potassium: 4 mEq/L (ref 3.5–5.1)
Sodium: 138 mEq/L (ref 135–145)
Total Bilirubin: 0.3 mg/dL (ref 0.3–1.2)

## 2011-07-01 LAB — RAPID URINE DRUG SCREEN, HOSP PERFORMED
Barbiturates: NOT DETECTED
Cocaine: NOT DETECTED
Tetrahydrocannabinol: NOT DETECTED

## 2011-07-01 LAB — URINALYSIS, ROUTINE W REFLEX MICROSCOPIC
Hgb urine dipstick: NEGATIVE
Nitrite: NEGATIVE
Protein, ur: NEGATIVE mg/dL
Specific Gravity, Urine: 1.023 (ref 1.005–1.030)
Urobilinogen, UA: 0.2 mg/dL (ref 0.0–1.0)

## 2011-07-01 LAB — GLUCOSE, CAPILLARY
Glucose-Capillary: 93 mg/dL (ref 70–99)
Glucose-Capillary: 113 mg/dL — ABNORMAL HIGH (ref 70–99)

## 2011-07-01 LAB — ETHANOL: Alcohol, Ethyl (B): <11 mg/dL (ref 0–11)

## 2011-07-01 MED ORDER — ALUM & MAG HYDROXIDE-SIMETH 200-200-20 MG/5ML PO SUSP
30.0000 mL | ORAL | Status: DC | PRN
  Administered 2011-07-01: 30 mL via ORAL

## 2011-07-01 MED ORDER — ALUM & MAG HYDROXIDE-SIMETH 200-200-20 MG/5ML PO SUSP: 30.0000 mL | ORAL | Status: DC | PRN

## 2011-07-01 MED ORDER — ACETAMINOPHEN 325 MG PO TABS: 650.0000 mg | ORAL_TABLET | ORAL | Status: DC | PRN

## 2011-07-01 MED ORDER — MORPHINE SULFATE CR 30 MG PO TB12
60.0000 mg | ORAL_TABLET | Freq: Two times a day (BID) | ORAL | Status: DC
  Administered 2011-07-01: 60 mg via ORAL
  Filled 2011-07-01: qty 2

## 2011-07-01 MED ORDER — IBUPROFEN 600 MG PO TABS
600.0000 mg | ORAL_TABLET | Freq: Three times a day (TID) | ORAL | Status: DC | PRN
  Administered 2011-07-02 – 2011-07-07 (×7): 600 mg via ORAL
  Filled 2011-07-01 (×7): qty 1

## 2011-07-01 MED ORDER — LEVOTHYROXINE SODIUM 100 MCG PO TABS
200.0000 ug | ORAL_TABLET | Freq: Every day | ORAL | Status: DC
  Administered 2011-07-01 – 2011-07-13 (×13): 200 ug via ORAL
  Filled 2011-07-01 (×2): qty 2
  Filled 2011-07-01: qty 4
  Filled 2011-07-01 (×13): qty 2

## 2011-07-01 MED ORDER — ASPIRIN EC 81 MG PO TBEC
81.0000 mg | DELAYED_RELEASE_TABLET | Freq: Every day | ORAL | Status: DC
  Administered 2011-07-01 – 2011-07-12 (×12): 81 mg via ORAL
  Filled 2011-07-01 (×15): qty 1

## 2011-07-01 MED ORDER — POLYETHYLENE GLYCOL 3350 17 G PO PACK: 25.0000 g | PACK | Freq: Every day | ORAL | Status: DC | PRN

## 2011-07-01 MED ORDER — ACETAMINOPHEN 325 MG PO TABS: 650.0000 mg | ORAL_TABLET | Freq: Four times a day (QID) | ORAL | Status: DC | PRN

## 2011-07-01 MED ORDER — ONDANSETRON HCL 4 MG PO TABS
4.0000 mg | ORAL_TABLET | Freq: Three times a day (TID) | ORAL | Status: DC | PRN
  Administered 2011-07-01: 4 mg via ORAL
  Filled 2011-07-01: qty 1

## 2011-07-01 MED ORDER — IBUPROFEN 200 MG PO TABS: 600.0000 mg | ORAL_TABLET | Freq: Three times a day (TID) | ORAL | Status: DC | PRN

## 2011-07-01 MED ORDER — OXYCODONE-ACETAMINOPHEN 5-325 MG PO TABS: 1.0000 | ORAL_TABLET | Freq: Four times a day (QID) | ORAL | Status: DC | PRN

## 2011-07-01 MED ORDER — HYDROXYZINE HCL 25 MG PO TABS: 25.0000 mg | ORAL_TABLET | Freq: Every evening | ORAL | Status: DC | PRN

## 2011-07-01 MED ORDER — IPRATROPIUM-ALBUTEROL 18-103 MCG/ACT IN AERO
2.0000 | INHALATION_SPRAY | Freq: Three times a day (TID) | RESPIRATORY_TRACT | Status: DC
  Administered 2011-07-01 – 2011-07-13 (×17): 2 via RESPIRATORY_TRACT
  Filled 2011-07-01 (×2): qty 14.7

## 2011-07-01 MED ORDER — OMEGA-3-ACID ETHYL ESTERS 1 G PO CAPS
1.0000 g | ORAL_CAPSULE | Freq: Every day | ORAL | Status: DC
  Administered 2011-07-02: 1 g via ORAL
  Filled 2011-07-01 (×3): qty 1

## 2011-07-01 MED ORDER — INSULIN ASPART 100 UNIT/ML ~~LOC~~ SOLN
0.0000 [IU] | Freq: Three times a day (TID) | SUBCUTANEOUS | Status: DC
  Administered 2011-07-05: 2 [IU] via SUBCUTANEOUS

## 2011-07-01 MED ORDER — ONDANSETRON HCL 4 MG PO TABS: 4.0000 mg | ORAL_TABLET | Freq: Three times a day (TID) | ORAL | Status: DC | PRN

## 2011-07-01 MED ORDER — KETOROLAC TROMETHAMINE 60 MG/2ML IM SOLN
60.0000 mg | Freq: Once | INTRAMUSCULAR | Status: AC
  Administered 2011-07-01: 60 mg via INTRAMUSCULAR
  Filled 2011-07-01: qty 2

## 2011-07-01 MED ORDER — ASPIRIN 81 MG PO TBEC: 81.0000 mg | DELAYED_RELEASE_TABLET | Freq: Every day | ORAL | Status: DC

## 2011-07-01 MED ORDER — OXYCODONE-ACETAMINOPHEN 5-325 MG PO TABS
1.0000 | ORAL_TABLET | Freq: Four times a day (QID) | ORAL | Status: DC | PRN
  Filled 2011-07-01: qty 1

## 2011-07-01 MED ORDER — MORPHINE SULFATE CR 30 MG PO TB12
60.0000 mg | ORAL_TABLET | Freq: Three times a day (TID) | ORAL | Status: DC
  Administered 2011-07-01 – 2011-07-03 (×6): 60 mg via ORAL
  Filled 2011-07-01: qty 1
  Filled 2011-07-01: qty 2
  Filled 2011-07-01: qty 1
  Filled 2011-07-01 (×4): qty 2

## 2011-07-01 MED ORDER — TRAZODONE HCL 50 MG PO TABS
150.0000 mg | ORAL_TABLET | Freq: Every evening | ORAL | Status: DC | PRN
  Administered 2011-07-01: 21:00:00 via ORAL
  Administered 2011-07-02 – 2011-07-12 (×7): 150 mg via ORAL
  Filled 2011-07-01 (×3): qty 1
  Filled 2011-07-01: qty 2
  Filled 2011-07-01 (×2): qty 1
  Filled 2011-07-01: qty 3
  Filled 2011-07-01 (×2): qty 1
  Filled 2011-07-01: qty 2

## 2011-07-01 MED ORDER — MAGNESIUM HYDROXIDE 400 MG/5ML PO SUSP: 30.0000 mL | Freq: Every day | ORAL | Status: DC | PRN

## 2011-07-01 MED ORDER — LORAZEPAM 1 MG PO TABS
1.0000 mg | ORAL_TABLET | Freq: Three times a day (TID) | ORAL | Status: DC | PRN
  Administered 2011-07-01 – 2011-07-02 (×2): 1 mg via ORAL
  Filled 2011-07-01 (×2): qty 1

## 2011-07-01 MED ORDER — TRAMADOL HCL 50 MG PO TABS
ORAL_TABLET | ORAL | Status: AC
  Filled 2011-07-01: qty 1

## 2011-07-01 MED ORDER — VITAMIN D3 25 MCG (1000 UNIT) PO TABS
1000.0000 [IU] | ORAL_TABLET | Freq: Every day | ORAL | Status: DC
  Administered 2011-07-02 – 2011-07-13 (×12): 1000 [IU] via ORAL
  Filled 2011-07-01 (×14): qty 1

## 2011-07-01 MED ORDER — LORAZEPAM 1 MG PO TABS: 1.0000 mg | ORAL_TABLET | Freq: Three times a day (TID) | ORAL | Status: DC | PRN

## 2011-07-01 MED ORDER — FLUTICASONE PROPIONATE HFA 44 MCG/ACT IN AERO
1.0000 | INHALATION_SPRAY | Freq: Two times a day (BID) | RESPIRATORY_TRACT | Status: DC
  Administered 2011-07-01 – 2011-07-13 (×20): 1 via RESPIRATORY_TRACT
  Filled 2011-07-01 (×2): qty 10.6

## 2011-07-01 MED ORDER — VITAMIN D 1000 UNITS PO CAPS: 1000.0000 [IU] | ORAL_CAPSULE | Freq: Every day | ORAL | Status: DC

## 2011-07-01 MED ORDER — TRAMADOL HCL 50 MG PO TABS
50.0000 mg | ORAL_TABLET | Freq: Once | ORAL | Status: AC
  Administered 2011-07-01: 50 mg via ORAL

## 2011-07-01 NOTE — ED Notes (Signed)
Report received from Previous RN. First contact with patient. Pt is tearful in room. Per previous RN, Pt was send from [facility] after she expressing her thought of SI with a knife or a gun. When arrived to ED, pt reports left rib pain, had fallen on Thursday and xray per this visit shown rib fractures. During her stay, she expressed the desire to leave the facility, therefore she was IVC. Pt now tearful in room. Pt just arrived to this room from a [facility]

## 2011-07-01 NOTE — ED Notes (Signed)
Pt presented from the [facility], at this time pt do not have bed however, pt sent for med clearance and there after will be revaluated for admission.  Upon arrival pt stated that she has a plan, SI, to use gun or knife. Pt also c/o shoulder and left rib area pain secondary to the fall on Thursday. Pt reports having syncopal episode and reports pain upon inhalation, secondary to the pain.
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
## 2011-07-01 NOTE — ED Notes (Signed)
Pt is upset that she has been, "lying here all night and no one has told her anything." She is visibly upset and tearful. She is requesting that her husband is called. His cell number is [phone]. She is also upset that she has not had oxygen on, but refused to let me check her oxygen level when offered stating, she knows she needs it.  She has had COPD for 2 years. "I just want my husband to come and get me out of here."

## 2011-07-01 NOTE — ED Notes (Signed)
Patient is resting comfortably. Sitter at bedside.  
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
## 2011-07-01 NOTE — ED Notes (Signed)
When giving pt her Morphine, she became very agitated and upset because apparently she takes 75 mg normally. She then asked how long will she be, "in this place." When I informed her that it is a long process that can take days even, she began to yell at this staff member stating that she is not going to sit here for days because she is not an animal. She was yelling that everyone is lying to her and she wants to leave. I advised her that she cannot leave b/c she is here under IVC and she got even more angry calling me a liar and threatening to sue the hospital because she is "not supposed to be here involuntarily." She demanded that I produce the IVC papers to her, yelling, "I mean now, not 3 or 4 o'clock this afternoon. I am just as important as these other people in here!"

## 2011-07-01 NOTE — ED Notes (Signed)
Patient is resting comfortably. 

## 2011-07-01 NOTE — ED Notes (Signed)
[name] Fitzpatrick ACT team sts pt is not ready to be discharge at the moment. Waiting for paperwork to be finished

## 2011-07-01 NOTE — ED Notes (Signed)
Sitter at bedside. Environment secured. 

## 2011-07-01 NOTE — ED Provider Notes (Signed)
Filed Vitals:   07/01/11 1143  BP: 98/64  Pulse: 69  Temp: 97.8 F (36.6 C)  Resp: 16    Patient has been accepted to behavioral health. She will be transferred.  Cyndra Numbers, MD 07/01/11 1256

## 2011-07-01 NOTE — ED Notes (Signed)
Pt calling out for assistance. This RN responded. She is asking to for someone to see what she can have for pain. Advised her that she has Tylenol and Motrin orders, but she did not want either of these stating that she takes multiple, much stronger medications for pain regularly and, "Tylenol and Motrin is not going to cut it." ED MD notified and orders given. Will inform primary RN.

## 2011-07-01 NOTE — ED Notes (Signed)
Pt states she doesn't deserve to live. Pt states family is not supportive, states she feels her husband, son and daughter in law think she is crazy. Granddaughter shared information with her from daughter in law. Pt tearful, pt angry with husband.

## 2011-07-01 NOTE — ED Notes (Signed)
HQI:[id number]<XB> Expected date:<BR> Expected time:<BR> Means of arrival:<BR> Comments:<BR> Hold for [employer]

## 2011-07-01 NOTE — ED Provider Notes (Signed)
History     CSN: [id number]  Arrival date & time 07/01/11  0155   First MD Initiated Contact with Patient 07/01/11 0301      Chief Complaint  Patient presents with  . Medical Clearance  . Suicidal    (Consider location/radiation/quality/duration/timing/severity/associated sxs/prior treatment) Patient is a 56 y.o. female presenting with mental health disorder. The history is provided by the patient. No language interpreter was used.  Mental Health Problem The primary symptoms include dysphoric mood. The primary symptoms do not include delusions, hallucinations, bizarre behavior, disorganized speech, negative symptoms or somatic symptoms. The current episode started more than 1 month ago. This is a chronic problem.  The dysphoric mood began more than 2 weeks ago. The mood has been worsening since its onset. She characterizes the problem as severe. The mood includes feelings of sadness and emptiness. Her change in mood was precipitated by a stressful event.  The onset of the illness is precipitated by a stressful event. The degree of incapacity that she is experiencing as a consequence of her illness is severe. Additional symptoms of the illness do not include no decreased need for sleep or no seizures. She admits to suicidal ideas. She does have a plan to commit suicide. She contemplates harming herself. She has not already injured self. She does not contemplate injuring another person. She has not already  injured another person. Risk factors that are present for mental illness include a history of mental illness.    Past Medical History  Diagnosis Date  . Anxiety   . Depression   . Hypothyroidism   . Osteoarthritis   . IBS (irritable bowel syndrome)     with constipation  . Fibromyalgia   . Chronic LBP   . Migraine headache   . Chronic neck pain   . DM type 2 (diabetes mellitus, type 2)   . Hypertension   . COPD (chronic obstructive pulmonary disease)   . H/O suicide attempt      Past Surgical History  Procedure Date  . Reconstucted right ankle   . C-spine fusion 1991, 2004  . Vesicovaginal fistula closure w/ tah 1993    Fibroids no Ca  . Right knee arthroscopic surgery   . B/l foot surgery --bunions   . L carpal tunnel release   . L spine fusion x 2   . Total reconstruction of right ankle and heel     total of three   . Cholecystectomy   . Abdominal hysterectomy     Family History  Problem Relation Age of Onset  . Cancer Father     pancreatic   . Pancreatic cancer Father   . Heart failure Mother   . GI problems Mother   . Heart disease Mother   . Diabetes Mother   . Thyroid disease Sister   . Cancer Maternal Grandfather   . Cancer      family history     History  Substance Use Topics  . Smoking status: Current Everyday Smoker -- 1.0 packs/day for 40 years    Types: Cigarettes  . Smokeless tobacco: Not on file  . Alcohol Use: No    OB History    Grav Para Term Preterm Abortions TAB SAB Ect Mult Living                  Review of Systems  Constitutional: Negative.   HENT: Negative.   Eyes: Negative.   Respiratory: Negative.   Cardiovascular: Negative for chest pain.  Gastrointestinal: Negative for abdominal distention.  Genitourinary: Negative for difficulty urinating.  Musculoskeletal: Negative.   Skin: Negative.   Neurological: Negative.  Negative for seizures.  Hematological: Negative.   Psychiatric/Behavioral: Positive for dysphoric mood. Negative for hallucinations.    Allergies  Prednisone; Penicillins; Cephalexin; Latex; and Metoclopramide hcl  Home Medications   Current Outpatient Rx  Name Route Sig Dispense Refill  . IPRATROPIUM-ALBUTEROL 18-103 MCG/ACT IN AERO Inhalation Inhale 2 puffs into the lungs 3 (three) times daily. Take two puffs by mouth three times a day     . ASPIRIN 81 MG PO TBEC Oral Take 81 mg by mouth at bedtime. Take one tablet by mouth daily    . B COMPLEX PO TABS Oral Take 1 tablet by mouth  daily.      . BUPROPION HCL ER (XL) 300 MG PO TB24 Oral Take 300 mg by mouth daily.    Marland Kitchen VITAMIN D 1000 UNITS PO CAPS Oral Take 1,000 Units by mouth daily.      . CHROMIUM ASPARTATE PO Oral Take 1 tablet by mouth daily.      Marland Kitchen DIAZEPAM 5 MG PO TABS Oral Take 10 mg by mouth daily.    Marland Kitchen FLUTICASONE PROPIONATE  HFA 44 MCG/ACT IN AERO Inhalation Inhale 1 puff into the lungs 2 (two) times daily. Two puffs two times a day     . HYDROMORPHONE HCL 4 MG PO TABS Oral Take 4 mg by mouth every 4 (four) hours as needed.    . INSULIN GLARGINE 100 UNIT/ML [place] SOLN Subcutaneous Inject 10 Units into the skin at bedtime. As needed patient still checks CBG daily    . LEVOTHYROXINE SODIUM 200 MCG PO TABS Oral Take 1 tablet (200 mcg total) by mouth daily. 30 tablet 3  . MS CONTIN PO Oral Take 75 mg by mouth 3 (three) times daily.      Marland Kitchen FISH OIL BURP-LESS PO Oral Take 2-3 capsules by mouth daily.      Marland Kitchen OVER THE COUNTER MEDICATION Oral Take 3 tablets by mouth daily. CALCIUM/MAGNESIUM/ZINC COMBINATION TABLET. OTC     . POLYETHYLENE GLYCOL 3350 PO PACK Oral Take 25 g by mouth daily as needed. Dissolved in water or juice as needed for constipation     . TRAZODONE HCL ER 150 MG PO TB24 Oral Take by mouth. Take one tablet by mouth at bedtime       BP 102/58  Pulse 82  Temp(Src) 98.2 F (36.8 C) (Oral)  Resp 20  SpO2 95%  Physical Exam  Constitutional: She is oriented to person, place, and time. She appears well-developed and well-nourished.  HENT:  Head: Normocephalic and atraumatic.  Mouth/Throat: No oropharyngeal exudate.  Eyes: Conjunctivae are normal. Pupils are equal, round, and reactive to light.  Neck: Normal range of motion. Neck supple.  Cardiovascular: Normal rate and regular rhythm.   Pulmonary/Chest: Effort normal and breath sounds normal.  Abdominal: Soft. Bowel sounds are normal. There is no tenderness. There is no rebound and no guarding.  Musculoskeletal: Normal range of motion.  Neurological:  She is alert and oriented to person, place, and time.  Skin: Skin is warm and dry.  Psychiatric: She has a normal mood and affect.    ED Course  Procedures (including critical care time)  Labs Reviewed  CBC - Abnormal; Notable for the following:    HCT 35.8 (*)    All other components within normal limits  URINE RAPID DRUG SCREEN (HOSP PERFORMED) - Abnormal; Notable  for the following:    Opiates POSITIVE (*)    Benzodiazepines POSITIVE (*)    Amphetamines POSITIVE (*)    All other components within normal limits  GLUCOSE, CAPILLARY - Abnormal; Notable for the following:    Glucose-Capillary 116 (*)    All other components within normal limits  COMPREHENSIVE METABOLIC PANEL  ETHANOL  URINALYSIS, ROUTINE W REFLEX MICROSCOPIC   Dg Chest 2 View  07/01/2011  *RADIOLOGY REPORT*  Clinical Data: Fall.  Left rib and chest pain.  Dyspnea.  CHEST - 2 VIEW  Comparison: 10/20/2010  Findings: Heart size is normal.  Mild scarring again noted in the lingula.  No evidence of pulmonary infiltrate or edema.  No evidence of pleural effusion or pneumothorax.  No mass or lymphadenopathy identified.  Left lateral 9th and 10th rib fracture deformities noted.  IMPRESSION:  1.  Left lateral ninth and tenth rib fracture deformities.  No evidence of pneumothorax or hemothorax. 2. Stable mild lingular scarring.  Original Report Authenticated By: Danae Orleans, M.D.   Dg Shoulder Right  07/01/2011  *RADIOLOGY REPORT*  Clinical Data: Right shoulder pain.  Recent fall earlier today.  RIGHT SHOULDER - 2+ VIEW  Comparison: None.  Findings: No evidence of fracture or dislocation.  Mild degenerative spurring is seen involving the acromioclavicular joint.  IMPRESSION:  1.  No acute findings. 2.  Mild acromioclavicular degenerative spurring.  Original Report Authenticated By: Danae Orleans, M.D.   Dg Tibia/fibula Right  07/01/2011  *RADIOLOGY REPORT*  Clinical Data: Fall.  Leg swelling and pain.  RIGHT TIBIA AND FIBULA - 2  VIEW  Comparison: None.  Findings: No evidence of acute fracture or dislocation.  Multiple fixation plates and screws are seen across the tibiotalar and subtalar joints.  Generalized osteopenia noted.  IMPRESSION: No acute findings.  Original Report Authenticated By: Danae Orleans, M.D.     1. Depression   2. Rib fractures   3. Suicidal ideation       MDM  ACT team to [facility] will reconsider patient in am       Marrianne Sica K Zakya Halabi-Rasch, MD 07/01/11 [id number]

## 2011-07-01 NOTE — ED Notes (Signed)
Pt given the phone for her first phone call of the day.

## 2011-07-01 NOTE — ED Notes (Signed)
Pt has 3 pt belongings that are locked in the cabinet in ER room 5, pt has cane at the nursing station with pt label

## 2011-07-01 NOTE — ED Notes (Signed)
Report called to receiving nurse. All questions answered.

## 2011-07-01 NOTE — ED Notes (Signed)
Patient's jewelry removed. Including glasses,silver watch, 2 yellow gold rings, and a pair of silver earrings.

## 2011-07-02 DIAGNOSIS — F1994 Other psychoactive substance use, unspecified with psychoactive substance-induced mood disorder: Secondary | ICD-10-CM

## 2011-07-02 DIAGNOSIS — F332 Major depressive disorder, recurrent severe without psychotic features: Secondary | ICD-10-CM | POA: Diagnosis present

## 2011-07-02 DIAGNOSIS — F4310 Post-traumatic stress disorder, unspecified: Secondary | ICD-10-CM

## 2011-07-02 LAB — GLUCOSE, CAPILLARY
Glucose-Capillary: 97 mg/dL (ref 70–99)
Glucose-Capillary: 106 mg/dL — ABNORMAL HIGH (ref 70–99)

## 2011-07-02 MED ORDER — CARBAMAZEPINE 200 MG PO TABS
200.0000 mg | ORAL_TABLET | Freq: Three times a day (TID) | ORAL | Status: DC | PRN
  Administered 2011-07-06 – 2011-07-12 (×4): 200 mg via ORAL
  Filled 2011-07-02 (×5): qty 1

## 2011-07-02 MED ORDER — MIRTAZAPINE 15 MG PO TBDP
7.5000 mg | ORAL_TABLET | Freq: Every day | ORAL | Status: DC
  Filled 2011-07-02: qty 0.5

## 2011-07-02 MED ORDER — MIRTAZAPINE 15 MG PO TABS
7.5000 mg | ORAL_TABLET | Freq: Every day | ORAL | Status: DC
  Administered 2011-07-02 – 2011-07-12 (×11): 7.5 mg via ORAL
  Filled 2011-07-02: qty 7
  Filled 2011-07-02 (×12): qty 0.5

## 2011-07-02 MED ORDER — ONDANSETRON HCL 4 MG PO TABS
4.0000 mg | ORAL_TABLET | Freq: Two times a day (BID) | ORAL | Status: DC | PRN
  Administered 2011-07-04: 4 mg via ORAL

## 2011-07-02 MED ORDER — OMEGA-3-ACID ETHYL ESTERS 1 G PO CAPS
1.0000 g | ORAL_CAPSULE | Freq: Two times a day (BID) | ORAL | Status: DC
  Administered 2011-07-03 – 2011-07-13 (×21): 1 g via ORAL
  Filled 2011-07-02 (×16): qty 1
  Filled 2011-07-02: qty 14
  Filled 2011-07-02 (×8): qty 1
  Filled 2011-07-02: qty 14

## 2011-07-02 NOTE — H&P (Signed)
Medical/psychiatric screening examination/treatment/procedure(s) were performed by non-physician practitioner and as supervising physician I was immediately available for consultation/collaboration.  I have seen and examined this patient and agree the major elements of this evaluation.  

## 2011-07-02 NOTE — Progress Notes (Signed)
Pt is currently sleeping in bed with MHT at bedside. At hs pt was given scheduled meds one of which was for pain. Pain did decrease from 10 to 7. She was assisted in the shower and dressed in clean clothes. She ate her dinner tray and only complaint was indigestion for which she was medicated with relief. CBG 98. Pulse ox reading at 2300 indicated 92%. Lonzo Cloud, NP notified and O2 initiated via [place]. On recheck pt's sat level is at 99%. 1:1 remains at bedside for safety and pt is safe. [name] Fitzpatrick
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
## 2011-07-02 NOTE — Progress Notes (Signed)
[facility] MD Progress Note  07/02/2011 2:35 PM  Diagnosis:  Axis I: Major Depression, Recurrent severe, Substance Abuse and Substance Induced Mood Disorder  ADL's:  Intact  Sleep: Poor  Appetite:  Poor  Suicidal Ideation:  Pt denies any suicidal thoughts. Homicidal Ideation:  Denies adamantly any homicidal thoughts.  Mental Status Examination/Evaluation: Objective:  Appearance: Casual uses a wheel chair to get around the unit against the advice of her   Eye Contact::  Minimal  Speech:  alternating between mute and profanity demanding to leave.  Volume:  Normal  Mood:  Anxious and Depressed  Affect:  Congruent  Thought Process:  unable to assess due to pt's uncooperativeness.  Orientation:  unable to assess due to pt's uncooperativeness.  Thought Content:  unable to assess due to pt's uncooperativeness.  Suicidal Thoughts:  No  Homicidal Thoughts:  No  Memory:  Immediate;   Fair  Judgement:  unable to assess due to pt's uncooperativeness.  Insight:  unable to assess due to pt's uncooperativeness.  Psychomotor Activity:  Normal  Concentration:  Poor  Recall:  unable to assess due to pt's uncooperativeness.  Akathisia:  No  AIMS (if indicated):     Assets:  Housing  Sleep:  Number of Hours: 5    ROS: Neuro: unable to assess due to pt's uncooperativeness.  GI: unable to assess due to pt's uncooperativeness.  MS: unable to assess due to pt's uncooperativeness.  Vital Signs:Blood pressure 122/80, pulse 98, temperature 97.3 F (36.3 C), temperature source Oral, resp. rate 19, height 5\' 3"  (1.6 m), weight 90.719 kg (200 lb), SpO2 96.00%. Current Medications: Current Facility-Administered Medications  Medication Dose Route Frequency Provider Last Rate Last Dose  . acetaminophen (TYLENOL) tablet 650 mg  650 mg Oral Q6H PRN Viviann Spare, NP      . albuterol-ipratropium (COMBIVENT) inhaler 2 puff  2 puff Inhalation TID Viviann Spare, NP   2 puff at 07/02/11 0851  . alum &  mag hydroxide-simeth (MAALOX/MYLANTA) 200-200-20 MG/5ML suspension 30 mL  30 mL Oral Q4H PRN Viviann Spare, NP   30 mL at 07/01/11 2219  . aspirin EC tablet 81 mg  81 mg Oral QHS Mike Craze, MD   81 mg at 07/01/11 2117  . carbamazepine (TEGRETOL) tablet 200 mg  200 mg Oral TID PRN Mike Craze, MD      . cholecalciferol (VITAMIN D) tablet 1,000 Units  1,000 Units Oral Daily Mike Craze, MD   1,000 Units at 07/02/11 0800  . fluticasone (FLOVENT HFA) 44 MCG/ACT inhaler 1 puff  1 puff Inhalation BID Viviann Spare, NP   1 puff at 07/02/11 0851  . ibuprofen (ADVIL,MOTRIN) tablet 600 mg  600 mg Oral Q8H PRN Viviann Spare, NP   600 mg at 07/02/11 0323  . insulin aspart (novoLOG) injection 0-15 Units  0-15 Units Subcutaneous TID WC Viviann Spare, NP      . levothyroxine (SYNTHROID, LEVOTHROID) tablet 200 mcg  200 mcg Oral Daily Viviann Spare, NP   200 mcg at 07/02/11 0851  . magnesium hydroxide (MILK OF MAGNESIA) suspension 30 mL  30 mL Oral Daily PRN Viviann Spare, NP      . mirtazapine (REMERON SOL-TAB) disintegrating tablet 7.5 mg  7.5 mg Oral QHS Mike Craze, MD      . morphine (MS CONTIN) 12 hr tablet 60 mg  60 mg Oral Q8H Mike Craze, MD   60 mg at 07/02/11 1352  .  omega-3 acid ethyl esters (LOVAZA) capsule 1 g  1 g Oral Daily Viviann Spare, NP   1 g at 07/02/11 [id number]  . polyethylene glycol (MIRALAX / GLYCOLAX) packet 25 g  25 g Oral Daily PRN Viviann Spare, NP      . traZODone (DESYREL) tablet 150 mg  150 mg Oral QHS PRN Viviann Spare, NP      . DISCONTD: acetaminophen (TYLENOL) tablet 650 mg  650 mg Oral Q4H PRN Viviann Spare, NP      . DISCONTD: alum & mag hydroxide-simeth (MAALOX/MYLANTA) 200-200-20 MG/5ML suspension 30 mL  30 mL Oral PRN Viviann Spare, NP      . DISCONTD: aspirin EC tablet 81 mg  81 mg Oral QHS Viviann Spare, NP      . DISCONTD: hydrOXYzine (ATARAX/VISTARIL) tablet 25 mg  25 mg Oral QHS PRN Viviann Spare, NP      . DISCONTD:  LORazepam (ATIVAN) tablet 1 mg  1 mg Oral Q8H PRN Viviann Spare, NP   1 mg at 07/02/11 0926  . DISCONTD: ondansetron (ZOFRAN) tablet 4 mg  4 mg Oral Q8H PRN Viviann Spare, NP      . DISCONTD: oxyCODONE-acetaminophen (PERCOCET) 5-325 MG per tablet 1 tablet  1 tablet Oral Q6H PRN Viviann Spare, NP      . DISCONTD: Vitamin D 1,000 Units  1,000 Units Oral Daily Viviann Spare, NP       Facility-Administered Medications Ordered in Other Encounters  Medication Dose Route Frequency Provider Last Rate Last Dose  . DISCONTD: acetaminophen (TYLENOL) tablet 650 mg  650 mg Oral Q4H PRN April K Palumbo-Rasch, MD      . DISCONTD: alum & mag hydroxide-simeth (MAALOX/MYLANTA) 200-200-20 MG/5ML suspension 30 mL  30 mL Oral PRN April K Palumbo-Rasch, MD      . DISCONTD: ibuprofen (ADVIL,MOTRIN) tablet 600 mg  600 mg Oral Q8H PRN April K Palumbo-Rasch, MD      . DISCONTD: LORazepam (ATIVAN) tablet 1 mg  1 mg Oral Q8H PRN April K Palumbo-Rasch, MD      . DISCONTD: morphine (MS CONTIN) 12 hr tablet 60 mg  60 mg Oral Q12H Cyndra Numbers, MD   60 mg at 07/01/11 1250  . DISCONTD: ondansetron (ZOFRAN) tablet 4 mg  4 mg Oral Q8H PRN April K Palumbo-Rasch, MD   4 mg at 07/01/11 1212  . DISCONTD: oxyCODONE-acetaminophen (PERCOCET) 5-325 MG per tablet 1 tablet  1 tablet Oral Q6H PRN April K Palumbo-Rasch, MD        Lab Results:  Results for orders placed during the hospital encounter of 07/01/11 (from the past 48 hour(s))  GLUCOSE, CAPILLARY     Status: Abnormal   Collection Time   07/01/11  6:39 PM      Component Value Range Comment   Glucose-Capillary 113 (*) 70 - 99 (mg/dL)   GLUCOSE, CAPILLARY     Status: Normal   Collection Time   07/01/11  9:54 PM      Component Value Range Comment   Glucose-Capillary 98  70 - 99 (mg/dL)    Comment 1 Notify RN     GLUCOSE, CAPILLARY     Status: Normal   Collection Time   07/02/11  6:11 AM      Component Value Range Comment   Glucose-Capillary 97  70 - 99 (mg/dL)     GLUCOSE, CAPILLARY     Status: Abnormal   Collection Time  07/02/11 11:42 AM      Component Value Range Comment   Glucose-Capillary 106 (*) 70 - 99 (mg/dL)     Physical Findings: AIMS:  , ,  ,  ,    CIWA:  CIWA-Ar Total: 5  COWS:  COWS Total Score: 11   Treatment Plan Summary: Daily contact with patient to assess and evaluate symptoms and progress in treatment Medication management  Plan: Commit against her will in that she has very little insight about how her use of addictive medications affect her.  Pt demands to be allowed to go home.  Earlier today she had no recall of her being in the Emergency Department at [facility].  Yet later she states that she recalls the exact way that the nurse asked her about suicide in the ED.  Her memory is reported as being sketchy at times.  It seems that there are certainly some emotional overtones to when her memory seems to work best.  See if she has ability to discuss elements of her use of addictive medications tomorrow.  Gee Habig 07/02/2011, 2:35 PM

## 2011-07-02 NOTE — Progress Notes (Signed)
Patient seen to discuss discharge planning needs.  She advised of being in pain rated at ten and not wanting to talk.  She was irritated about not receiving requested medications and stated she did not want to answer additional questions.  Patient declined to allow contact with husband.

## 2011-07-02 NOTE — Progress Notes (Signed)
Found pt in her room sitting on her bed at start of shift. 1:1 MHT present. Pt is yelling, cursing, angry and states, "I am not here involuntarily. You are stupid liars. I walked in here myself." "The nurse at the ED first told me my family took out papers. Then she said the doctor signed papers but they only spent a few minutes with me. And my family wouldn't put me away like that." Reassurance given to pt that staff aware she walked into [facility] (prior to med clearance assess in ED) and that what mattered was she was here now and safe. "You get paid to say that. You don't care. No one cares. My husband and his whores don't care. He thinks I'm crazy and he's convinced my only son that I'm crazy too." Spent quite awhile 1:1 providing support and encouragement. Ativan 1mg  po given per prn order. Pt provided fluids and meal tray. Clothes brought in by husband currently being searched so that pt may shower. Pt receptive to support but still agitated and hopeless. She contracts for safety and 1:1 obs remains for safety. [name]
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
## 2011-07-02 NOTE — Progress Notes (Signed)
Pt is resting in bed with eyes closed at this time. Requested something for pain and was given motrin with relief. No distress reported or observed. 1:1 in place for safety. Pt remains safe. [name]

## 2011-07-02 NOTE — Progress Notes (Signed)
[facility] Group Notes:  (Counselor/Nursing/MHT/Case Management/Adjunct)  07/02/2011  @ 11:00 Overcoming Obstacles to Wellness  Type of Therapy:  Group Therapy  Participation Level:  Did Not Attend     [name] 07/02/2011  4:14 PM   [facility] Group Notes:  (Counselor/Nursing/MHT/Case Management/Adjunct) 07/02/2011   @ 1:15pm Fear of Change   Type of Therapy:  Group Therapy  Participation Level:  Did Not Attend  [name] 07/02/2011  4:15 PM
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
## 2011-07-03 LAB — GLUCOSE, CAPILLARY
Glucose-Capillary: 94 mg/dL (ref 70–99)
Glucose-Capillary: 120 mg/dL — ABNORMAL HIGH (ref 70–99)

## 2011-07-03 MED ORDER — NICOTINE 21 MG/24HR TD PT24
21.0000 mg | MEDICATED_PATCH | Freq: Every day | TRANSDERMAL | Status: DC
  Administered 2011-07-03 – 2011-07-13 (×12): 21 mg via TRANSDERMAL
  Filled 2011-07-03 (×13): qty 1

## 2011-07-03 MED ORDER — HYDROMORPHONE HCL 2 MG PO TABS
4.0000 mg | ORAL_TABLET | ORAL | Status: DC | PRN
  Administered 2011-07-03 – 2011-07-05 (×4): 4 mg via ORAL
  Administered 2011-07-05 – 2011-07-07 (×2): 8 mg via ORAL
  Administered 2011-07-07: 4 mg via ORAL
  Administered 2011-07-08: 8 mg via ORAL
  Administered 2011-07-10 – 2011-07-12 (×5): 4 mg via ORAL
  Filled 2011-07-03 (×2): qty 4
  Filled 2011-07-03 (×4): qty 2
  Filled 2011-07-03: qty 4
  Filled 2011-07-03 (×4): qty 2
  Filled 2011-07-03 (×2): qty 4

## 2011-07-03 MED ORDER — DIAZEPAM 5 MG PO TABS
2.5000 mg | ORAL_TABLET | Freq: Four times a day (QID) | ORAL | Status: DC | PRN
  Administered 2011-07-03 – 2011-07-11 (×6): 2.5 mg via ORAL
  Filled 2011-07-03 (×7): qty 1

## 2011-07-03 MED ORDER — MORPHINE SULFATE CR 30 MG PO TB12
75.0000 mg | ORAL_TABLET | Freq: Three times a day (TID) | ORAL | Status: DC
  Administered 2011-07-03 – 2011-07-11 (×24): 75 mg via ORAL
  Filled 2011-07-03 (×3): qty 5
  Filled 2011-07-03 (×7): qty 1
  Filled 2011-07-03: qty 5
  Filled 2011-07-03: qty 1
  Filled 2011-07-03 (×2): qty 5
  Filled 2011-07-03: qty 2
  Filled 2011-07-03: qty 5
  Filled 2011-07-03: qty 3
  Filled 2011-07-03: qty 1
  Filled 2011-07-03: qty 5
  Filled 2011-07-03 (×8): qty 1

## 2011-07-03 MED ORDER — LIDOCAINE 5 % EX PTCH
1.0000 | MEDICATED_PATCH | CUTANEOUS | Status: DC
  Administered 2011-07-03: 1 via TRANSDERMAL
  Filled 2011-07-03 (×4): qty 1

## 2011-07-03 NOTE — Progress Notes (Signed)
Pt in her room with 1:1 sitter.  Pt is up, looking in her bags to get items to bathe.  Pt has requested pain medication which she is given.  Pt is guarded with this Clinical research associate, but cooperative.  She voices no other needs/concerns at this time.  She denies SI/HI at this time.  Continue 1:1 for safety due to high fall risk.

## 2011-07-03 NOTE — Progress Notes (Addendum)
1230   Patient agreed to take her lunch time meds after she had refused.  Treatment team visited patient in her room because she refused to meet with Dr. Dan Humphreys during treatment team.  O2 at 2L continues.   Patient continues to lay in her bed with 1:1 present.   Still refuses to go to groups, or any other activities.  Respirations even and unlabored.  No signs of distress noted on patient's face or body movements.   1:1 will continue per MD order. 1330   Patient continues to sleep in her bed.  O2 at 2L.  1:1 continues per MD order.  No signs of pain/distress noted on patient's face or body movements.

## 2011-07-03 NOTE — Progress Notes (Addendum)
Pt. C/o about meds thinks dosage should be increased to 75mg   as opposed to the 60mg  of MS contin that's ordered. Pt. Encouraged to speak to physician. Pt. Refuses food but takes meds accordingly. Staff will continue 1:1 for safety.

## 2011-07-03 NOTE — Tx Team (Signed)
Interdisciplinary Treatment Plan Update (Adult)  Date:  07/03/2011  Time Reviewed:  10:26 AM   Progress in Treatment: Attending groups: No, refusing to get out of bed or attend any groups Participating in groups:  No Taking medication as prescribed: No, refused her inhalers this morning Tolerating medication: Yes Family/Significant othe contact made:  No, refuses to allow contact with anyone Patient understands diagnosis: Yes Discussing patient identified problems/goals with staff:  Yes Medical problems stabilized or resolved: Yes Denies suicidal/homicidal ideation: No, reports she is still suicidal Issues/concerns per patient self-inventory:  Will not discuss or document her progress on inventory Other:  New problem(s) identified: None  Reason for Continuation of Hospitalization: Aggression Anxiety Depression Medication stabilization Suicidal ideation  Interventions implemented related to continuation of hospitalization:  Medication stabilization, safety checks q 15 mins, group attendance  Additional comments:  [name] Fitzpatrick is refusing to get out of bed, go to groups, answer questions or take her medications as prescribed.   Estimated length of stay: 3-5 days  Discharge Plan:  Discharge home, follow up with [facility]   New goal(s):  Review of initial/current patient goals per problem list:   1.  Goal(s): Decrease depressive rating to 4 or less  Met:  No  Target date: by discharge   As evidenced by: Refused to answer requests for ratings - appears flat and depressed  2.  Goal (s): Reduce potential for self-harm/suicide  Met:  No  Target date: by discharge  As evidenced by: Refuses to answer whether she is suicidal but makes statements that she does not want to continue living like this  3.  Goal(s): Decrease anxiety rating to 4 or less  Met:  No  Target date: by discharge  As evidenced by: Refuses to answer requests for ratings  4.  Goal(s):  Medication stabilization  Met:  No  Target date: by discharge  As evidenced by: Symptoms do not appear to have improved  Attendees: Patient:  [name] Fitzpatrick 07/03/2011 10:26 AM  Family:     Physician:  Dr Orson Aloe, MD 07/03/2011 10:26 AM  Nursing:    Quintella Reichert, RN 07/03/2011 10:26 AM  Case Manager:  Juline Patch, LCSW 07/03/2011 10:26 AM  Counselor:  Angus Palms, LCSW 07/03/2011 10:26 AM  Other:  Reyes Ivan, LCSWA 07/03/2011 10:26 AM  Other:  Serena Colonel, NP 07/03/2011 10:26 AM  Other:     Other:      Scribe for Treatment Team:   [name], 07/03/2011 10:26 AM

## 2011-07-03 NOTE — Progress Notes (Signed)
Patient ID: [name] Fitzpatrick, female   DOB: [birth year], 56 y.o.   MRN: [id number] 1:1 continues.  Safety maintained.   Ox at 2L.   Cane by bedside and wheelchair in room with 1:1 present for safety.   Patient continues sleeping.

## 2011-07-03 NOTE — Progress Notes (Signed)
[facility] Group Notes:  (Counselor/Nursing/MHT/Case Management/Adjunct) 07/03/2011  @ 11:00AM Feelings About Diagnosis   Type of Therapy:  Group Therapy  Participation Level:  Did Not Attend  [name] 07/03/2011  1:59 PM        [facility] Group Notes:  (Counselor/Nursing/MHT/Case Management/Adjunct)  07/03/2011   @ 1:15PM Breathing and Meditation for Anxiety/Anger   Type of Therapy:  Group Therapy  Participation Level:  Did Not Attend     [name] 07/03/2011  2:00 PM
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
## 2011-07-03 NOTE — Progress Notes (Signed)
  [facility] Adult Inpatient Family/Significant Other Suicide Prevention Education  Suicide Prevention Education:  Patient Refusal for Family/Significant Other Suicide Prevention Education: The patient [name] Fitzpatrick has refused to provide written consent for family/significant other to be provided Family/Significant Other Suicide Prevention Education during admission and/or prior to discharge.  Physician notified.  [name] Fitzpatrick would not allow discussion of suicide prevention or answer questions. Suicide prevention pamphlet left with her in her room.  [name] Fitzpatrick 07/03/2011, 10:44 AM

## 2011-07-03 NOTE — Progress Notes (Signed)
[name] Fitzpatrick refuses to get out of beds. She did not attend the grief and loss group facilitated by chaplaincy staff this morning, although she was encouraged to do so.  [name] 07/03/2011  3:15 PM

## 2011-07-03 NOTE — Progress Notes (Signed)
[id number]   Patient has 1:1 present.  Safety maintained, cane by bedside, wheelchair in room for ambulation.  Patient will not get out of bed to eat or go to groups.   Has O2 at 2L.  O2 sat 96%.  Patient refused to eat her breakfast or a snack.  Refused to take her inhalers this morning.   Denied SI and HI.   Denied A/V hallucinations.  Encouraged patient to talk to nurse/ MHT sitter about her problems and patient refused to discuss her feelings.  1:1 to continue per MD order for safety.

## 2011-07-03 NOTE — Progress Notes (Signed)
1630  Patient laying in bed with 1:1 present.  No signs of distress or pain noted.  Respirations even and unlabored.  Safety maintained.   Will continue to monitor with 1:1 per MD order. 1730   Patient refused inhalers.  Took p.o. Med.  Lidocaine patch applied to left abdomen for rib pain.  Patient stated she did not want dinner to eat.  Has been in bed all day.  No signs of pain or distress noted on patient's face or body movements. 1930   Patient in bathroom with 1:1 present.   Safety maintained.  No signs of pain or distress noted.   Will continue to monitor for safety per MD order.
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
## 2011-07-04 LAB — GLUCOSE, CAPILLARY
Glucose-Capillary: 90 mg/dL (ref 70–99)
Glucose-Capillary: 80 mg/dL (ref 70–99)

## 2011-07-04 MED ORDER — PANTOPRAZOLE SODIUM 20 MG PO TBEC
20.0000 mg | DELAYED_RELEASE_TABLET | Freq: Two times a day (BID) | ORAL | Status: DC
  Administered 2011-07-05 – 2011-07-13 (×16): 20 mg via ORAL
  Filled 2011-07-04 (×23): qty 1

## 2011-07-04 MED ORDER — ENSURE COMPLETE PO LIQD: 237.0000 mL | Freq: Three times a day (TID) | ORAL | Status: DC

## 2011-07-04 MED ORDER — AMITRIPTYLINE HCL 25 MG PO TABS
25.0000 mg | ORAL_TABLET | Freq: Every day | ORAL | Status: DC
  Administered 2011-07-04 – 2011-07-12 (×9): 25 mg via ORAL
  Filled 2011-07-04 (×4): qty 1
  Filled 2011-07-04: qty 10
  Filled 2011-07-04 (×7): qty 1

## 2011-07-04 MED ORDER — LIDOCAINE 5 % EX PTCH
3.0000 | MEDICATED_PATCH | CUTANEOUS | Status: DC
  Administered 2011-07-05 – 2011-07-06 (×2): 3 via TRANSDERMAL
  Filled 2011-07-04 (×10): qty 3

## 2011-07-04 NOTE — Progress Notes (Signed)
Patient seen during discharge planning group.  She advised of doing feeling well today.  She denies SI/HI but endorses depression, anxiety, hopelessness and helplessness at ten.  She advised of being seen outpatient by [facility].  Follow up scheduled
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
## 2011-07-04 NOTE — Progress Notes (Signed)
Patient ID: [name] Fitzpatrick, female   DOB: 1955/12/11, 56 y.o.   MRN: [id number] 1:1 observation is ongoing due to pt increased risk for falls. Pt is unable to ambulate safely  and utilizes a wheelchair for movement. Pt mood is depressed and her affect is sad but she is cooperative with staff. Pt denies SI and HI and AVH. Pt expressed concerns about post discharge living arraignments. Writer encouraged pt to explore options with case management which she has already done. Pt also states that her appetite is poor and that she has not eaten since admission. Food was offered but pt refuses to eat. Pt is drinking adequate fluids. Writer informed MD and will continue to monitor.

## 2011-07-04 NOTE — Progress Notes (Signed)
Patient ID: [name] Fitzpatrick, female   DOB: 09-26-1955, 56 y.o.   MRN: [id number] 1:1 Observation is ongoing due to increased risk for falls. Pt continues to spend most of her time in bed. Pt is able to ambulate with a cane to the bathroom and is also able to shower independently. Writer instructed sitter to remain within arms reach whenever the pt is ambulating for safety. Pt is still without an appetite and refuses food and ensure supplements. Pt mood is depressed and her affect is sad but she is cooperative with staff. Writer will continue to monitor.

## 2011-07-04 NOTE — Progress Notes (Signed)
Patient ID: [name] Fitzpatrick, female   DOB: 1955/09/01, 56 y.o.   MRN: [id number] 1:1 observation is ongoing due to increased risk for falls. Pt is spending much of her time in bed but has attended two groups today. Pt utilizes wheelchair for movement. Pt denies SI/HI and AVH and is cooperative with staff. Pt still not eating today but did c/o nausea. PRN medications administered as needed and nausea is relieved. CBG is WNL. Pt environment is safe and pt is in no distress. Writer will continue to monitor.

## 2011-07-04 NOTE — Progress Notes (Signed)
[facility] Group Notes:  (Counselor/Nursing/MHT/Case Management/Adjunct) 07/04/2011 1:15PM Positive and Negative Self-Worth   Type of Therapy:  Group Therapy  Participation Level:  Did Not Attend    [name] Fitzpatrick 07/04/2011  3:51 PM

## 2011-07-04 NOTE — Progress Notes (Signed)
[facility] Group Notes:  (Counselor/Nursing/MHT/Case Management/Adjunct) 07/04/2011   11:00AM Emotion Regulation   Type of Therapy:  Group Therapy  Participation Level:  Did Not Attend    [name] 07/04/2011  2:45 PM

## 2011-07-05 LAB — GLUCOSE, CAPILLARY
Glucose-Capillary: 130 mg/dL — ABNORMAL HIGH (ref 70–99)
Glucose-Capillary: 133 mg/dL — ABNORMAL HIGH (ref 70–99)

## 2011-07-05 MED ORDER — POLYETHYLENE GLYCOL 3350 17 G PO PACK
17.0000 g | PACK | Freq: Every day | ORAL | Status: DC
  Administered 2011-07-05 – 2011-07-11 (×6): 17 g via ORAL
  Filled 2011-07-05 (×10): qty 1

## 2011-07-05 MED ORDER — POLYETHYLENE GLYCOL 3350 17 G PO PACK
25.0000 g | PACK | Freq: Every day | ORAL | Status: DC
Start: 2011-07-05 — End: 2011-07-05
  Filled 2011-07-05 (×2): qty 2

## 2011-07-05 MED ORDER — PROMETHAZINE HCL 25 MG PO TABS
25.0000 mg | ORAL_TABLET | Freq: Four times a day (QID) | ORAL | Status: DC | PRN
  Administered 2011-07-06 – 2011-07-12 (×10): 25 mg via ORAL
  Filled 2011-07-05 (×10): qty 1

## 2011-07-05 MED ORDER — ENSURE PUDDING PO PUDG
1.0000 | Freq: Three times a day (TID) | ORAL | Status: DC
  Administered 2011-07-05 – 2011-07-09 (×3): 1 via ORAL
  Filled 2011-07-05 (×32): qty 1

## 2011-07-05 MED ORDER — FERROUS SULFATE 325 (65 FE) MG PO TABS
325.0000 mg | ORAL_TABLET | Freq: Two times a day (BID) | ORAL | Status: DC
  Administered 2011-07-06 – 2011-07-13 (×15): 325 mg via ORAL
  Filled 2011-07-05 (×12): qty 1
  Filled 2011-07-05 (×2): qty 14
  Filled 2011-07-05 (×5): qty 1

## 2011-07-05 MED ORDER — ENSURE PUDDING PO PUDG
1.0000 | Freq: Every day | ORAL | Status: DC
  Filled 2011-07-05 (×10): qty 1

## 2011-07-05 NOTE — Tx Team (Signed)
Interdisciplinary Treatment Plan Update (Adult)  Date:  07/05/2011  Time Reviewed:  9:52 AM   Progress in Treatment: Attending groups:   Yes   Participating in groups:  Yes Taking medication as prescribed:  Yes Tolerating medication:  Yes Family/Significant othe contact made: Patient declines to allow contact with husband/family Patient understands diagnosis:  Yes Discussing patient identified problems/goals with staff: Yes Medical problems stabilized or resolved: Yes Denies suicidal/homicidal ideation:Yes Issues/concerns per patient self-inventory:  Other:  New problem(s) identified:  Reason for Continuation of Hospitalization: Anxiety Depression Medication stabilization  Interventions implemented related to continuation of hospitalization:  Medication Management; safety checks q 15 mins  Additional comments:  Estimated length of stay:  Discharge Plan:  New goal(s):  Review of initial/current patient goals per problem list:    1.  Goal(s):  Reduce SI/thoughts of self hamr  Met:  Yes  Target date: d/c  As evidenced by:  Patient is not endorsing SI at this time  2.  Goal (s): Reduce depression,anxiety, hopelessness/helplessness (all rated at ten today)  Met:  No  Target date:  d/c  As evidenced by:  Patient will rate symptoms at four or below  3.  Goal(s):  Stabilize on medications  Met:  No  Target date: d/c  As evidenced by:  Patient will report medications are working - symptoms have decrased  4.  Goal(s):  Increase self-esteem  Met:  No  Target date: d/c  As evidenced by: Patient will be able to identify at least one thing good about herself  Attendees: Patient:     Family:     Physician:  Orson Aloe, MD 07/05/2011 9:52 AM   Nursing:    07/05/2011 9:52 AM   CaseManager:  Juline Patch, LCSW 07/05/2011 9:52 AM   Counselor:  Angus Palms, LCSW 07/05/2011 9:52 AM   Other:  Consuello Bossier, NP 07/05/2011 9:52 AM   Other:  Reyes Ivan,  LCSWA 07/05/2011  9:52 AM   Other:     Other:      Scribe for Treatment Team:   Wynn Banker, LCSW,  07/05/2011 9:52 AM

## 2011-07-05 NOTE — Plan of Care (Signed)
Problem: Disordered Eating Pattern (NB-1.5) Goal: Nutrition education Formal process to instruct or train a patient/client in a skill or to impart knowledge to help patients/clients voluntarily manage or modify food choices and eating behavior to maintain or improve health.  Outcome: Completed/Met Date Met:  07/05/11 Disordered eating pattern addressed on 4/18 with nutrition counseling session.   Comments:  Consulted to see patient for poor PO intake. Spoke with patient's RN, RN stated patient refuses to go to the cafeteria to get meals at meal times.Patient reported she has no appetite. She reported to feel nausea and the smell of foods cause her not to want to eat more. Encouraged her to try foods that have little to no odor, ex. Eggs, oatmeal, soup, toast. She reported she dislikes Ensure Nutrition supplement but would be willing to try Ensure pudding. Patient did express fear of gaining weight. Discussed healthy foods and encouraged patient to eat small frequent meals through out the day. Patient expressed that she likes peanut butter and granola bars. She stated on a typical day she does not leave the house or eat much. She reported her main meal is granola thins with peanut butter.   Height: 63" Weight: 200 lb. BMI: 35.42 kg/m^2  Estimated Calorie needs: 1300-1600 kcal, 57.5-67.9 grams of protein and 1 ml fluid per kcal intake daily   Patient voiced snack preferences. Encouraged patient to small frequent meals through out the day and to choose items that do not have a strong odor to help with feelings of nausea. Patient agreed to try Ensure pudding and snacks of peanut butter and granola bars at snack times.  Recommend: 10am snack 2 Peanut butter packet with 1 Kashi bar.                         2 pm snack Pretzels, fuit cup and peanut butter packet.                         9 pm snack Vanilla Ensure pudding.                         *At meal times offer patient foods without strong odors.    Goal: Increased PO intake.   Patient verbalized understanding. Patient is without any nutrition questions at this time. Patient agreed to try to eat small frequent meals, expect fair compliance.  RD available for nutrition needs.  Iven Finn [facility] [phone]

## 2011-07-05 NOTE — Progress Notes (Signed)
Patient ID: [name] Fitzpatrick, female   DOB: 04/28/1955, 56 y.o.   MRN: [id number] 1:1 observation is ongoing due to pt increased risk for falls. Pt is compliant with safety interventions and utilizes a cane when ambulating and a wheelchair when traveling on the unit. Sitter is within arms reach when pt is ambulating. Pt refused ensure pudding due to the taste. Writer encouraged pt to request snacks that are appetizing. Pt has been somewhat active on the unit and has attended some groups today. Pt did go outside for recreation this afternoon. Pt is currently resting in bed and is no distress at this time.
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
## 2011-07-05 NOTE — Discharge Instructions (Signed)
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 These 3 things are the foundation of rehabilitating your brain. If memory is a problem then INSTEAD of the fish oil mentioned above, try using Brain Power Basics from [employer].  You can order online or by phone [phone]. It costs $99 for the first month, and $80 monthly thereafter, but that investment in your brain and the recovery of your brain proper functioning would seem worth it.  Strongly consider attending at least 6 Alanon Meetings to help you learn about how your helping others to the exclusion of helping yourself is actually hurting yourself and is actually an addiction to fixing others and that you need to work the 12 Step to Happiness through the [employer]. Al-Anon Family Groups could be helpful with how to deal with substance abusing family and friends. Or your own issues of being in victim role.  There are only 40 Alanon Family Group meetings a week here in [place].  There are [employer].  Search on line and there you can learn the format and can access the schedule for yourself.  They ask you to temproarily block call waiting by starting with *70 then their number is [phone]

## 2011-07-05 NOTE — Progress Notes (Signed)
Patient ID: [name] Fitzpatrick, female   DOB: 1955-09-30, 56 y.o.   MRN: [id number] 1:1 observation is ongoing for increased risk for falls. Pt is compliant with safety interventions and is cooperative with staff. Pt is still not eating food but nutritionist met with pt this afternoon and made recommendations. Foods with low odors, peanut butter and granola bars as well as ensure pudding are good options for the pt at this time. Also, pt did go outside this afternoon with sitter. Pt is not in distress and writer will continue to monitor.

## 2011-07-05 NOTE — Progress Notes (Signed)
Pt ate all of the sandwich provided to her and drank some gingerale.

## 2011-07-05 NOTE — Progress Notes (Signed)
Pt remains on 1:1 d/t high fall risk.  She is still somewhat irritable, but she has been more cooperative tonight than earlier in the week.  She denies SI/HI.  She was easier to engage in conversation tonight, and smiled more than once during the conversation.  Pt is c/o headache and is being given Ibuprofen for pain.  She has received Dilaudid only once today(this morning) in addition to her scheduled Morphine.  She was ordered Ensures d/t her poor appetite, but she says the smell of them makes her nauseated.  She requested a sandwich which was given.  Will monitor how much she eats.  Sitter with pt.  Pt remains safe.

## 2011-07-05 NOTE — Progress Notes (Signed)
Patient ID: [name] Fitzpatrick, female   DOB: 09-10-1955, 56 y.o.   MRN: [id number] 1:1 Observation is ongoing for pt increased risk for falls. Pt is compliant with safety measures. Pt utilizes cane when ambulating and wheelchair for transportation. Pt changes position in bed to prevent pressure sores. Pt PO intake is very poor and pt is refusing ensure supplements as well. Pt did eat a sand which last night but no food today. Pt denies SI/Hi and AVH and pt is attending groups. Sitter instructed to remain within arms reach at all times when pt is changing positions and ambulating in room. Writer will continue to monitor.
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
## 2011-07-06 DIAGNOSIS — F0781 Postconcussional syndrome: Secondary | ICD-10-CM | POA: Diagnosis present

## 2011-07-06 LAB — GLUCOSE, CAPILLARY
Glucose-Capillary: 116 mg/dL — ABNORMAL HIGH (ref 70–99)
Glucose-Capillary: 119 mg/dL — ABNORMAL HIGH (ref 70–99)
Glucose-Capillary: 128 mg/dL — ABNORMAL HIGH (ref 70–99)

## 2011-07-06 MED ORDER — LORATADINE 10 MG PO TABS
10.0000 mg | ORAL_TABLET | Freq: Every day | ORAL | Status: DC
  Administered 2011-07-06 – 2011-07-13 (×8): 10 mg via ORAL
  Filled 2011-07-06: qty 7
  Filled 2011-07-06 (×9): qty 1

## 2011-07-06 MED ORDER — CARBAMAZEPINE 200 MG PO TABS
200.0000 mg | ORAL_TABLET | Freq: Three times a day (TID) | ORAL | Status: DC
  Administered 2011-07-07 – 2011-07-13 (×20): 200 mg via ORAL
  Filled 2011-07-06 (×4): qty 1
  Filled 2011-07-06: qty 42
  Filled 2011-07-06 (×9): qty 1
  Filled 2011-07-06: qty 42
  Filled 2011-07-06 (×3): qty 1
  Filled 2011-07-06: qty 42
  Filled 2011-07-06 (×6): qty 1

## 2011-07-06 MED ORDER — GUAIFENESIN ER 600 MG PO TB12
600.0000 mg | ORAL_TABLET | Freq: Two times a day (BID) | ORAL | Status: DC
  Administered 2011-07-06 – 2011-07-12 (×13): 600 mg via ORAL
  Filled 2011-07-06 (×19): qty 1

## 2011-07-06 NOTE — Progress Notes (Signed)
[facility] MD Progress Note  07/06/2011 11:08 PM  Diagnosis:  Axis I: Major Depression, Recurrent severe, Substance Abuse and Substance Induced Mood Disorder  ADL's:  Intact  Sleep: Poor,   Appetite:  Poor, but offered Ensure pudding.  Suicidal Ideation:  Pt denies suicidal thoughts. Homicidal Ideation:  Denies adamantly any homicidal thoughts.  Mental Status Examination/Evaluation: Objective:  Appearance: Casual   Eye Contact::  Minimal  Speech:  Clear and Coherent  Volume:  Normal  Mood:  describes not sure how she feels after having her own family session with her husband.  Anxiety: appears pretty calm.  Affect:  Congruent  Thought Process:  Coherent  Orientation:  Full  Thought Content:  WDL  Suicidal Thoughts:  No  Homicidal Thoughts:  No  Memory:  Immediate;   Fair  Judgement:  Fair  Insight:  Fair  Psychomotor Activity:  Normal  Concentration:  Poor  Recall:  Fair  Akathisia:  No  AIMS (if indicated):     Assets:  Communication Skills Desire for Improvement  Sleep:  Number of Hours: 4.5    ROS: Neuro: up walking more today.  Pharmacist suggested Depakote as it might help her anger better, but Tegretol would be better if she has.   Resp: has a tough time breathing and having a cough after spending time yesterday walking around the garden and checking out all the different plants.  Was offered an opportunity to go to the ED for a CXR, but declines in that she has had pneumonia so many times that she knows that she herself will know in the morning whether she has pneumonia with chest pains and more cough.    GI: some nausea, but no V/D/Cramps.  MS: R foot and ankle and ribs are still her worst pains.  Vital Signs:Blood pressure 89/66, pulse 72, temperature 98.4 F (36.9 C), temperature source Oral, resp. rate 19, height 5\' 3"  (1.6 m), weight 90.719 kg (200 lb), SpO2 97.00%. Current Medications: Current Facility-Administered Medications  Medication Dose Route  Frequency Provider Last Rate Last Dose  . acetaminophen (TYLENOL) tablet 650 mg  650 mg Oral Q6H PRN Viviann Spare, NP      . albuterol-ipratropium (COMBIVENT) inhaler 2 puff  2 puff Inhalation TID Viviann Spare, NP   2 puff at 07/06/11 1657  . alum & mag hydroxide-simeth (MAALOX/MYLANTA) 200-200-20 MG/5ML suspension 30 mL  30 mL Oral Q4H PRN Viviann Spare, NP   30 mL at 07/01/11 2219  . amitriptyline (ELAVIL) tablet 25 mg  25 mg Oral QHS Mike Craze, MD   25 mg at 07/06/11 2232  . aspirin EC tablet 81 mg  81 mg Oral QHS Mike Craze, MD   81 mg at 07/06/11 2232  . carbamazepine (TEGRETOL) tablet 200 mg  200 mg Oral TID PRN Mike Craze, MD   200 mg at 07/06/11 2238  . carbamazepine (TEGRETOL) tablet 200 mg  200 mg Oral TID Mike Craze, MD      . cholecalciferol (VITAMIN D) tablet 1,000 Units  1,000 Units Oral Daily Mike Craze, MD   1,000 Units at 07/06/11 0915  . diazepam (VALIUM) tablet 2.5 mg  2.5 mg Oral Q6H PRN Mike Craze, MD   2.5 mg at 07/05/11 2005  . feeding supplement (ENSURE) pudding 1 Container  1 Container Oral QPC supper Anastasio Champion, RD      . feeding supplement (ENSURE) pudding 1 Container  1 Container Oral TID WC  Mike Craze, MD   1 Container at 07/06/11 1718  . ferrous sulfate tablet 325 mg  325 mg Oral BID WC Mike Craze, MD   325 mg at 07/06/11 1700  . fluticasone (FLOVENT HFA) 44 MCG/ACT inhaler 1 puff  1 puff Inhalation BID Viviann Spare, NP   1 puff at 07/06/11 1657  . guaiFENesin (MUCINEX) 12 hr tablet 600 mg  600 mg Oral BID Sanjuana Kava, NP   600 mg at 07/06/11 1656  . HYDROmorphone (DILAUDID) tablet 4-8 mg  4-8 mg Oral Q4H PRN Mike Craze, MD   8 mg at 07/05/11 2002  . ibuprofen (ADVIL,MOTRIN) tablet 600 mg  600 mg Oral Q8H PRN Viviann Spare, NP   600 mg at 07/06/11 0916  . insulin aspart (novoLOG) injection 0-15 Units  0-15 Units Subcutaneous TID WC Viviann Spare, NP   2 Units at 07/05/11 1732  . levothyroxine  (SYNTHROID, LEVOTHROID) tablet 200 mcg  200 mcg Oral Daily Viviann Spare, NP   200 mcg at 07/06/11 0914  . lidocaine (LIDODERM) 5 % 3 patch  3 patch Transdermal Q24H Mike Craze, MD   3 patch at 07/06/11 1843  . loratadine (CLARITIN) tablet 10 mg  10 mg Oral Daily Sanjuana Kava, NP   10 mg at 07/06/11 1659  . magnesium hydroxide (MILK OF MAGNESIA) suspension 30 mL  30 mL Oral Daily PRN Viviann Spare, NP      . mirtazapine (REMERON) tablet 7.5 mg  7.5 mg Oral QHS Mike Craze, MD   7.5 mg at 07/06/11 2233  . morphine (MS CONTIN) 12 hr tablet 75 mg  75 mg Oral Q8H Mike Craze, MD   75 mg at 07/06/11 2244  . nicotine (NICODERM CQ - dosed in mg/24 hours) patch 21 mg  21 mg Transdermal Daily Mike Craze, MD   21 mg at 07/06/11 0658  . omega-3 acid ethyl esters (LOVAZA) capsule 1 g  1 g Oral BID Mike Craze, MD   1 g at 07/06/11 1657  . pantoprazole (PROTONIX) EC tablet 20 mg  20 mg Oral BID AC Mike Craze, MD   20 mg at 07/06/11 1656  . polyethylene glycol (MIRALAX / GLYCOLAX) packet 17 g  17 g Oral QHS Verne Spurr, PA-C   17 g at 07/06/11 2230  . promethazine (PHENERGAN) tablet 25 mg  25 mg Oral Q6H PRN Mike Craze, MD   25 mg at 07/06/11 1507  . traZODone (DESYREL) tablet 150 mg  150 mg Oral QHS PRN Viviann Spare, NP   150 mg at 07/05/11 2212    Lab Results:  Results for orders placed during the hospital encounter of 07/01/11 (from the past 48 hour(s))  GLUCOSE, CAPILLARY     Status: Normal   Collection Time   07/05/11  5:59 AM      Component Value Range Comment   Glucose-Capillary 98  70 - 99 (mg/dL)   GLUCOSE, CAPILLARY     Status: Abnormal   Collection Time   07/05/11 11:48 AM      Component Value Range Comment   Glucose-Capillary 122 (*) 70 - 99 (mg/dL)   GLUCOSE, CAPILLARY     Status: Abnormal   Collection Time   07/05/11  5:15 PM      Component Value Range Comment   Glucose-Capillary 130 (*) 70 - 99 (mg/dL)   GLUCOSE, CAPILLARY     Status: Abnormal  Collection Time   07/05/11  9:44 PM      Component Value Range Comment   Glucose-Capillary 133 (*) 70 - 99 (mg/dL)    Comment 1 Notify RN     GLUCOSE, CAPILLARY     Status: Abnormal   Collection Time   07/06/11  6:29 AM      Component Value Range Comment   Glucose-Capillary 116 (*) 70 - 99 (mg/dL)    Comment 1 Notify RN     GLUCOSE, CAPILLARY     Status: Abnormal   Collection Time   07/06/11 12:04 PM      Component Value Range Comment   Glucose-Capillary 115 (*) 70 - 99 (mg/dL)    Comment 1 Notify RN     GLUCOSE, CAPILLARY     Status: Abnormal   Collection Time   07/06/11  5:13 PM      Component Value Range Comment   Glucose-Capillary 119 (*) 70 - 99 (mg/dL)   GLUCOSE, CAPILLARY     Status: Abnormal   Collection Time   07/06/11  9:23 PM      Component Value Range Comment   Glucose-Capillary 128 (*) 70 - 99 (mg/dL)    Comment 1 Notify RN       Physical FindinIMS:  , ,  ,  ,    CIWA:  CIWA-Ar Total: 5  COWS:  COWS Total Score: 11   Treatment Plan Summary: Daily contact with patient to assess and evaluate symptoms and progress in treatment Medication management  Discussion/Plan: Pt calmer still today. She described details of how she had explained to her husband that things, a lot of things have to be different in the future.   She gave me permission to speak with her husband.  She was congratulated for her taking these steps to protect herself and begin building a new life for herself.  This was very significant for this lady.  She noted the pictures on the wall in my office depicting how opiates affect the brain.  She was surprised that the opiates would do that to people.  She was inclined to explore alternatives to the opiates.   [name] Fitzpatrick 07/06/2011, 11:08 PM

## 2011-07-06 NOTE — Progress Notes (Signed)
Patient ID: [name] Fitzpatrick, female   DOB: October 14, 1955, 56 y.o.   MRN: [id number] Pt. Sitting in day room watching TV, just before group pt. C/o pain and anxiety meds given (see MAR). Pt. Denies SHI. Pt. Preparing for karaoke. Pt. Reports going to groups and getting out of the room more. Staff will monitor 1:1 for safety. Staff will check for safety q61min.

## 2011-07-06 NOTE — Progress Notes (Addendum)
Pt did not attend group on today. Pt reported that " she was feeling real bad due to pain and depression."  Pt reported that her pain is a 10. Pt reported that her depression is an 8, anxiety 8, helplessness 8, and hopelessness 8. Pt verbalized that she has " not had a good appetite and that she " has been tossing and turning a lot at night." Pt reports that " she wanted to know if she had to leave her home and if so she " wants to find low-income housing. Pt reported that husband would be interested in a family session if available." This writer was able to inform counselor and was told to leave a note for the weekend case manager/ counselor to see if that was possible. Pt denies SI/HI at this time. No concerns voiced at this time.

## 2011-07-06 NOTE — Progress Notes (Signed)
Patient ID: [name] Fitzpatrick, female   DOB: 1955-08-20, 56 y.o.   MRN: [id number] Pt. In bathroom, preparing for bed, no distress noted. Staff will continue 1:1 for safety.

## 2011-07-06 NOTE — Progress Notes (Addendum)
[facility] Group Notes: (Counselor/Nursing/MHT/Case Management/Adjunct) 07/06/2011   @11 :00am Recovery and Preventing Relapse  Type of Therapy:  Group Therapy  Participation Level:  Did Not Attend  [name] Fitzpatrick 07/06/2011 12:48 PM     [facility] Group Notes:  (Counselor/Nursing/MHT/Case Management/Adjunct) 07/06/2011  1:15pm [facility] [place]   Type of Therapy:  Group Therapy  Participation Level:  Did Not Attend  [name] Fitzpatrick 07/06/2011  2:35 PM

## 2011-07-07 DIAGNOSIS — F332 Major depressive disorder, recurrent severe without psychotic features: Principal | ICD-10-CM

## 2011-07-07 LAB — GLUCOSE, CAPILLARY
Glucose-Capillary: 97 mg/dL (ref 70–99)
Glucose-Capillary: 86 mg/dL (ref 70–99)

## 2011-07-07 NOTE — Progress Notes (Signed)
[employer] [facility] [employer] MD Progress Note  07/07/2011 1:21 PM  Diagnosis:   Axis I: Major Depression, Recurrent severe and Post Traumatic Stress Disorder Axis II: Deferred Axis III:  Past Medical History  Diagnosis Date  . Anxiety   . Depression   . Hypothyroidism   . Osteoarthritis   . IBS (irritable bowel syndrome)     with constipation  . Fibromyalgia   . Chronic LBP   . Migraine headache   . Chronic neck pain   . DM type 2 (diabetes mellitus, type 2)   . Hypertension   . COPD (chronic obstructive pulmonary disease)   . H/O suicide attempt    Subjective:  [name] Fitzpatrick is sitting up on the edge of her bed with her lunch tray, but is not eating.  She reports that her mood is "up and down." She denies any suicidal or homicidal ideation today. She endorses poor appetite. She reports that her sleep is poor in that she wakes during the night.  ADL's:  Intact  Sleep: Fair  Appetite:  Poor  Suicidal Ideation:  None Homicidal Ideation:  None  AEB (as evidenced by):  Mental Status Examination/Evaluation: Objective:  Appearance: Fairly Groomed  Patent attorney::  Fair  Speech:  Clear and Coherent  Volume:  Normal  Mood:  Anxious  Affect:  Congruent  Thought Process:  Circumstantial and Logical  Orientation:  Full  Thought Content:  WDL  Suicidal Thoughts:  No  Homicidal Thoughts:  No  Memory:  Remote;   Good  Judgement:  Impaired  Insight:  Lacking  Psychomotor Activity:  Normal  Concentration:  Good  Recall:  Good  Akathisia:  No  Handed:    AIMS (if indicated):     Assets:  Desire for Improvement Leisure Time  Sleep:  Number of Hours: 3.5    Vital Signs:Blood pressure 101/67, pulse 68, temperature 97.7 F (36.5 C), temperature source Oral, resp. rate 12, height 5\' 3"  (1.6 m), weight 90.719 kg (200 lb), SpO2 100.00%. Current Medications: Current Facility-Administered Medications  Medication Dose Route Frequency Provider Last Rate Last Dose  . acetaminophen (TYLENOL) tablet 650 mg   650 mg Oral Q6H PRN Viviann Spare, NP      . albuterol-ipratropium (COMBIVENT) inhaler 2 puff  2 puff Inhalation TID Viviann Spare, NP   2 puff at 07/07/11 [phone]  . alum & mag hydroxide-simeth (MAALOX/MYLANTA) 200-200-20 MG/5ML suspension 30 mL  30 mL Oral Q4H PRN Viviann Spare, NP   30 mL at 07/01/11 2219  . amitriptyline (ELAVIL) tablet 25 mg  25 mg Oral QHS Mike Craze, MD   25 mg at 07/06/11 2232  . aspirin EC tablet 81 mg  81 mg Oral QHS Mike Craze, MD   81 mg at 07/06/11 2232  . carbamazepine (TEGRETOL) tablet 200 mg  200 mg Oral TID PRN Mike Craze, MD   200 mg at 07/06/11 2238  . carbamazepine (TEGRETOL) tablet 200 mg  200 mg Oral TID Mike Craze, MD   200 mg at 07/07/11 0947  . cholecalciferol (VITAMIN D) tablet 1,000 Units  1,000 Units Oral Daily Mike Craze, MD   1,000 Units at 07/07/11 [phone]  . diazepam (VALIUM) tablet 2.5 mg  2.5 mg Oral Q6H PRN Mike Craze, MD   2.5 mg at 07/05/11 2005  . feeding supplement (ENSURE) pudding 1 Container  1 Container Oral QPC supper Anastasio Champion, RD      . feeding supplement (ENSURE) pudding  1 Container  1 Container Oral TID WC Mike Craze, MD   1 Container at 07/06/11 1718  . ferrous sulfate tablet 325 mg  325 mg Oral BID WC Mike Craze, MD   325 mg at 07/07/11 0949  . fluticasone (FLOVENT HFA) 44 MCG/ACT inhaler 1 puff  1 puff Inhalation BID Viviann Spare, NP   1 puff at 07/07/11 0956  . guaiFENesin (MUCINEX) 12 hr tablet 600 mg  600 mg Oral BID Sanjuana Kava, NP   600 mg at 07/07/11 0945  . HYDROmorphone (DILAUDID) tablet 4-8 mg  4-8 mg Oral Q4H PRN Mike Craze, MD   4 mg at 07/07/11 0305  . ibuprofen (ADVIL,MOTRIN) tablet 600 mg  600 mg Oral Q8H PRN Viviann Spare, NP   600 mg at 07/07/11 0307  . insulin aspart (novoLOG) injection 0-15 Units  0-15 Units Subcutaneous TID WC Viviann Spare, NP   2 Units at 07/05/11 1732  . levothyroxine (SYNTHROID, LEVOTHROID) tablet 200 mcg  200 mcg Oral Daily Viviann Spare, NP   200 mcg at 07/07/11 0950  . lidocaine (LIDODERM) 5 % 3 patch  3 patch Transdermal Q24H Mike Craze, MD   3 patch at 07/06/11 1843  . loratadine (CLARITIN) tablet 10 mg  10 mg Oral Daily Sanjuana Kava, NP   10 mg at 07/07/11 0948  . magnesium hydroxide (MILK OF MAGNESIA) suspension 30 mL  30 mL Oral Daily PRN Viviann Spare, NP      . mirtazapine (REMERON) tablet 7.5 mg  7.5 mg Oral QHS Mike Craze, MD   7.5 mg at 07/06/11 2233  . morphine (MS CONTIN) 12 hr tablet 75 mg  75 mg Oral Q8H Mike Craze, MD   75 mg at 07/07/11 0946  . nicotine (NICODERM CQ - dosed in mg/24 hours) patch 21 mg  21 mg Transdermal Daily Mike Craze, MD   21 mg at 07/07/11 1003  . omega-3 acid ethyl esters (LOVAZA) capsule 1 g  1 g Oral BID Mike Craze, MD   1 g at 07/07/11 0945  . pantoprazole (PROTONIX) EC tablet 20 mg  20 mg Oral BID AC Mike Craze, MD   20 mg at 07/07/11 0951  . polyethylene glycol (MIRALAX / GLYCOLAX) packet 17 g  17 g Oral QHS Verne Spurr, PA-C   17 g at 07/06/11 2230  . promethazine (PHENERGAN) tablet 25 mg  25 mg Oral Q6H PRN Mike Craze, MD   25 mg at 07/07/11 0305  . traZODone (DESYREL) tablet 150 mg  150 mg Oral QHS PRN Viviann Spare, NP   150 mg at 07/05/11 2212    Lab Results:  Results for orders placed during the hospital encounter of 07/01/11 (from the past 48 hour(s))  GLUCOSE, CAPILLARY     Status: Abnormal   Collection Time   07/05/11  5:15 PM      Component Value Range Comment   Glucose-Capillary 130 (*) 70 - 99 (mg/dL)   GLUCOSE, CAPILLARY     Status: Abnormal   Collection Time   07/05/11  9:44 PM      Component Value Range Comment   Glucose-Capillary 133 (*) 70 - 99 (mg/dL)    Comment 1 Notify RN     GLUCOSE, CAPILLARY     Status: Abnormal   Collection Time   07/06/11  6:29 AM      Component Value Range Comment  Glucose-Capillary 116 (*) 70 - 99 (mg/dL)    Comment 1 Notify RN     GLUCOSE, CAPILLARY     Status: Abnormal   Collection Time    07/06/11 12:04 PM      Component Value Range Comment   Glucose-Capillary 115 (*) 70 - 99 (mg/dL)    Comment 1 Notify RN     GLUCOSE, CAPILLARY     Status: Abnormal   Collection Time   07/06/11  5:13 PM      Component Value Range Comment   Glucose-Capillary 119 (*) 70 - 99 (mg/dL)   GLUCOSE, CAPILLARY     Status: Abnormal   Collection Time   07/06/11  9:23 PM      Component Value Range Comment   Glucose-Capillary 128 (*) 70 - 99 (mg/dL)    Comment 1 Notify RN     GLUCOSE, CAPILLARY     Status: Normal   Collection Time   07/07/11  5:30 AM      Component Value Range Comment   Glucose-Capillary 97  70 - 99 (mg/dL)    Comment 1 Notify RN       Physical Findings: AIMS:  , ,  ,  ,    CIWA:  CIWA-Ar Total: 5  COWS:  COWS Total Score: 11   Treatment Plan Summary: Daily contact with patient to assess and evaluate symptoms and progress in treatment Medication management  Plan: We'll make no changes to her medications today, but if she continues to endorse poor sleep we'll consider increasing her Remeron to 15 mg.  Brainard Highfill 07/07/2011, 1:21 PM

## 2011-07-07 NOTE — Progress Notes (Signed)
Pt denies SI, but reports no change in her level of depression. Pt has been attending groups and has become more active in getting out of her room. Pt rated her pain at a level 8/10 with pain located on her lower back, rib cage, and right foot. Pt was given a scheduled 75 mg of morphine SR to manage pain. Pt oxygen concentrator was switched out for a new full bottle.  Pt remains safe at this Please see 1:1 notes written by this Clinical research associate for more notes.

## 2011-07-07 NOTE — Progress Notes (Signed)
Pt lying in her bed this morning, affect and mood brighter than yesterday. Eye contact is much improved over yesterday. Morning meds brought to Pt. And Pt given encouragement to get out of bed and attend the groups. Stated she felt better today and was planning on attending the program. Denies SI and HI. Pt did not cough while this writer was in the room. Remains on a 1:1 for her safety. Pt rates her depression and hopelessness at a 10 for both

## 2011-07-07 NOTE — Progress Notes (Signed)
[facility] Group Notes:  (Counselor/Nursing/MHT/Case Management/Adjunct)  07/07/2011 4:24 PM  Type of Therapy:  After Care group  Pt. Participated in after care planning group and was given [place] [facility] SI information as well as crisis hotline numbers. Pt. Agreed to use them if needed. The pt. Stated she was glad to be "alive " and discussed her depression and SI thoughts. The pt. was on a one to one and  stated she did not want to talk/share.   Lamar Blinks [place] 07/07/2011, 4:24 PM

## 2011-07-07 NOTE — Progress Notes (Signed)
[facility] Group Notes:  (Counselor/Nursing/MHT/Case Management/Adjunct)  07/07/2011 4:23 PM  Type of Therapy:  Group Therapy  Participation Level:  Did Not Attend   Neila Gear 07/07/2011, 4:23 PM

## 2011-07-07 NOTE — Progress Notes (Signed)
Pt's husband visiting and Pt gave her dinner to him saying she wasn't going to eat it anyway. Asked about the family meeting that will occur tomorrow. Called this writer to say she was feeling nauseated and needed something immediately. Pt appears to have increased symptoms when her husband is around. Given phenergan for nausea as requested. Continues to deny SI and HI. Remains on the 1:1 for her safety

## 2011-07-07 NOTE — Progress Notes (Signed)
Pt has attended the groups this morning and has participated in the program. Had an episode of difficulty breathing. Sounded as though she was having a broncho spasm. Became very anxious and frightened. Was calmed by staff where she was able to verbalize her thoughts and feelings. Given her inhaler. Remains on a 1:1 for her safety.

## 2011-07-08 LAB — GLUCOSE, CAPILLARY
Glucose-Capillary: 96 mg/dL (ref 70–99)
Glucose-Capillary: 101 mg/dL — ABNORMAL HIGH (ref 70–99)
Glucose-Capillary: 102 mg/dL — ABNORMAL HIGH (ref 70–99)

## 2011-07-08 MED ORDER — ALBUTEROL SULFATE HFA 108 (90 BASE) MCG/ACT IN AERS
INHALATION_SPRAY | RESPIRATORY_TRACT | Status: AC
  Administered 2011-07-08: 2 via RESPIRATORY_TRACT
  Filled 2011-07-08: qty 6.7

## 2011-07-08 MED ORDER — ALBUTEROL SULFATE HFA 108 (90 BASE) MCG/ACT IN AERS
2.0000 | INHALATION_SPRAY | RESPIRATORY_TRACT | Status: DC | PRN
  Administered 2011-07-08 (×2): 2 via RESPIRATORY_TRACT

## 2011-07-08 NOTE — Progress Notes (Signed)
[facility] Group Notes:  (Counselor/Nursing/MHT/Case Management/Adjunct)  07/08/2011 1315  Type of Therapy:  Group Therapy  Participation Level:  Did Not Attend  Modes of Intervention:  Clarification, Problem-solving, Socialization and Support  Summary of Progress/Problems:   [name], [name] 07/08/2011, 2:37 PM

## 2011-07-08 NOTE — Progress Notes (Signed)
[facility] MD Progress Note  07/08/2011 11:18 AM  Diagnosis:   Axis I: Major Depression, Recurrent severe and Post Traumatic Stress Disorder Axis II: Cluster C Traits Axis III:  Past Medical History  Diagnosis Date  . Anxiety   . Depression   . Hypothyroidism   . Osteoarthritis   . IBS (irritable bowel syndrome)     with constipation  . Fibromyalgia   . Chronic LBP   . Migraine headache   . Chronic neck pain   . DM type 2 (diabetes mellitus, type 2)   . Hypertension   . COPD (chronic obstructive pulmonary disease)   . H/O suicide attempt    Subjective: [name] Fitzpatrick is sitting on the edge of her bed having a conversation with her sitter.she denies any suicidal or homicidal ideation, but reports she had a rough night last night, and was unable to sleep in part because of pain. She also reports that she has no appetite and only eats bites of food here and there. She endorses no bowel movement in over one week. She refuses to accept any dietary supplement as they make her nauseated. He denies any auditory or visual hallucinations.  ADL's:  Impaired  Sleep: Poor  Appetite:  Poor  Suicidal Ideation:  None Homicidal Ideation:  None  AEB (as evidenced by):  Mental Status Examination/Evaluation: Objective:  Appearance: Disheveled  Eye Contact::  Good  Speech:  Clear and Coherent  Volume:  Normal  Mood:  Dysphoric and Irritable  Affect:  Congruent  Thought Process:  Circumstantial and Loose  Orientation:  Full  Thought Content:  WDL  Suicidal Thoughts:  No  Homicidal Thoughts:  No  Memory:  Remote;   Good  Judgement:  Poor  Insight:  Lacking  Psychomotor Activity:  Decreased  Concentration:  Good  Recall:  Good  Akathisia:  No  Handed:    AIMS (if indicated):     Assets:  Leisure Time  Sleep:  Number of Hours: 3.5    Vital Signs:Blood pressure 125/81, pulse 84, temperature 98.1 F (36.7 C), temperature source Oral, resp. rate 18, height 5\' 3"  (1.6 m), weight 90.719 kg (200  lb), SpO2 97.00%. Current Medications: Current Facility-Administered Medications  Medication Dose Route Frequency Provider Last Rate Last Dose  . acetaminophen (TYLENOL) tablet 650 mg  650 mg Oral Q6H PRN Viviann Spare, NP      . albuterol (PROVENTIL HFA;VENTOLIN HFA) 108 (90 BASE) MCG/ACT inhaler 2 puff  2 puff Inhalation Q4H PRN Wonda Cerise, MD   2 puff at 07/08/11 0529  . albuterol-ipratropium (COMBIVENT) inhaler 2 puff  2 puff Inhalation TID Viviann Spare, NP   2 puff at 07/08/11 [phone]  . alum & mag hydroxide-simeth (MAALOX/MYLANTA) 200-200-20 MG/5ML suspension 30 mL  30 mL Oral Q4H PRN Viviann Spare, NP   30 mL at 07/01/11 2219  . amitriptyline (ELAVIL) tablet 25 mg  25 mg Oral QHS Mike Craze, MD   25 mg at 07/07/11 2225  . aspirin EC tablet 81 mg  81 mg Oral QHS Mike Craze, MD   81 mg at 07/07/11 2225  . carbamazepine (TEGRETOL) tablet 200 mg  200 mg Oral TID PRN Mike Craze, MD   200 mg at 07/06/11 2238  . carbamazepine (TEGRETOL) tablet 200 mg  200 mg Oral TID Mike Craze, MD   200 mg at 07/08/11 0813  . cholecalciferol (VITAMIN D) tablet 1,000 Units  1,000 Units Oral Daily Mike Craze, MD  1,000 Units at 07/08/11 0812  . diazepam (VALIUM) tablet 2.5 mg  2.5 mg Oral Q6H PRN Mike Craze, MD   2.5 mg at 07/08/11 0436  . feeding supplement (ENSURE) pudding 1 Container  1 Container Oral QPC supper Anastasio Champion, RD      . feeding supplement (ENSURE) pudding 1 Container  1 Container Oral TID WC Mike Craze, MD   1 Container at 07/06/11 1718  . ferrous sulfate tablet 325 mg  325 mg Oral BID WC Mike Craze, MD   325 mg at 07/08/11 4782  . fluticasone (FLOVENT HFA) 44 MCG/ACT inhaler 1 puff  1 puff Inhalation BID Viviann Spare, NP   1 puff at 07/08/11 [phone]  . guaiFENesin (MUCINEX) 12 hr tablet 600 mg  600 mg Oral BID Sanjuana Kava, NP   600 mg at 07/08/11 0811  . HYDROmorphone (DILAUDID) tablet 4-8 mg  4-8 mg Oral Q4H PRN Mike Craze, MD   8 mg at  07/08/11 0316  . ibuprofen (ADVIL,MOTRIN) tablet 600 mg  600 mg Oral Q8H PRN Viviann Spare, NP   600 mg at 07/07/11 0307  . insulin aspart (novoLOG) injection 0-15 Units  0-15 Units Subcutaneous TID WC Viviann Spare, NP   2 Units at 07/05/11 1732  . levothyroxine (SYNTHROID, LEVOTHROID) tablet 200 mcg  200 mcg Oral Daily Viviann Spare, NP   200 mcg at 07/08/11 1308  . lidocaine (LIDODERM) 5 % 3 patch  3 patch Transdermal Q24H Mike Craze, MD   3 patch at 07/06/11 1843  . loratadine (CLARITIN) tablet 10 mg  10 mg Oral Daily Sanjuana Kava, NP   10 mg at 07/08/11 6578  . magnesium hydroxide (MILK OF MAGNESIA) suspension 30 mL  30 mL Oral Daily PRN Viviann Spare, NP      . mirtazapine (REMERON) tablet 7.5 mg  7.5 mg Oral QHS Mike Craze, MD   7.5 mg at 07/07/11 2225  . morphine (MS CONTIN) 12 hr tablet 75 mg  75 mg Oral Q8H Mike Craze, MD   75 mg at 07/08/11 4696  . nicotine (NICODERM CQ - dosed in mg/24 hours) patch 21 mg  21 mg Transdermal Daily Mike Craze, MD   21 mg at 07/08/11 [phone]  . omega-3 acid ethyl esters (LOVAZA) capsule 1 g  1 g Oral BID Mike Craze, MD   1 g at 07/08/11 [phone]  . pantoprazole (PROTONIX) EC tablet 20 mg  20 mg Oral BID AC Mike Craze, MD   20 mg at 07/08/11 2440  . polyethylene glycol (MIRALAX / GLYCOLAX) packet 17 g  17 g Oral QHS Verne Spurr, PA-C   17 g at 07/07/11 2224  . promethazine (PHENERGAN) tablet 25 mg  25 mg Oral Q6H PRN Mike Craze, MD   25 mg at 07/08/11 0926  . traZODone (DESYREL) tablet 150 mg  150 mg Oral QHS PRN Viviann Spare, NP   150 mg at 07/05/11 2212    Lab Results:  Results for orders placed during the hospital encounter of 07/01/11 (from the past 48 hour(s))  GLUCOSE, CAPILLARY     Status: Abnormal   Collection Time   07/06/11 12:04 PM      Component Value Range Comment   Glucose-Capillary 115 (*) 70 - 99 (mg/dL)    Comment 1 Notify RN     GLUCOSE, CAPILLARY     Status: Abnormal  Collection Time    07/06/11  5:13 PM      Component Value Range Comment   Glucose-Capillary 119 (*) 70 - 99 (mg/dL)   GLUCOSE, CAPILLARY     Status: Abnormal   Collection Time   07/06/11  9:23 PM      Component Value Range Comment   Glucose-Capillary 128 (*) 70 - 99 (mg/dL)    Comment 1 Notify RN     GLUCOSE, CAPILLARY     Status: Normal   Collection Time   07/07/11  5:30 AM      Component Value Range Comment   Glucose-Capillary 97  70 - 99 (mg/dL)    Comment 1 Notify RN     GLUCOSE, CAPILLARY     Status: Normal   Collection Time   07/07/11 11:53 AM      Component Value Range Comment   Glucose-Capillary 86  70 - 99 (mg/dL)    Comment 1 Notify RN     GLUCOSE, CAPILLARY     Status: Normal   Collection Time   07/07/11  5:16 PM      Component Value Range Comment   Glucose-Capillary 81  70 - 99 (mg/dL)    Comment 1 Notify RN     GLUCOSE, CAPILLARY     Status: Normal   Collection Time   07/07/11  8:32 PM      Component Value Range Comment   Glucose-Capillary 84  70 - 99 (mg/dL)    Comment 1 Notify RN     GLUCOSE, CAPILLARY     Status: Normal   Collection Time   07/08/11  6:13 AM      Component Value Range Comment   Glucose-Capillary 96  70 - 99 (mg/dL)     Physical Findings: AIMS:  , ,  ,  ,    CIWA:  CIWA-Ar Total: 5  COWS:  COWS Total Score: 11   Treatment Plan Summary: Daily contact with patient to assess and evaluate symptoms and progress in treatment Medication management  Plan: We will increase her Remeron to 15 mg at bedtime in hopes that it will improve her sleep as well as increase her appetite. We'll make available to her graham crackers and peanut butter and she feels she may be open to eat some of that.  [name] Fitzpatrick 07/08/2011, 11:18 AM

## 2011-07-08 NOTE — Progress Notes (Signed)
1:1 note 1000 Pt has complained of nausea and was given phenegran. Attended the group this morning and states that she is going to try and attend all the groups today. States that she continues to be in a lot of pain and discomfort. Has been given her standing pain medication this morning and rates her pain at a 10. Given support and reassurance. Pt remains on a 1:1 for her safety.

## 2011-07-08 NOTE — Progress Notes (Signed)
Pt upset over the family session. Stats that the counselor would not allow her to speak but allow her to speak about the past, but allowed her son to bring it up and didn't stop him. States that she cried the minute the meeting started and cried right through it. Denies SI and HI. Said the session was too much to explain and she didn't want to share it with me at that point. Given support. Remains safe.
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
## 2011-07-08 NOTE — Progress Notes (Signed)
Pt sitting in her room with her sitter and doing a lot of talking. Did come out for one group this morning but then stated she was tired and didn't sleep last night and she wanted to rest. Denies SI and HI. Remains safe.

## 2011-07-09 LAB — GLUCOSE, CAPILLARY: Glucose-Capillary: 108 mg/dL — ABNORMAL HIGH (ref 70–99)

## 2011-07-09 MED ORDER — MECLIZINE HCL 25 MG PO TABS
25.0000 mg | ORAL_TABLET | Freq: Three times a day (TID) | ORAL | Status: DC | PRN
  Administered 2011-07-09 – 2011-07-10 (×3): 25 mg via ORAL
  Filled 2011-07-09 (×4): qty 1

## 2011-07-09 NOTE — Progress Notes (Signed)
Patient ID: [name] Fitzpatrick, female   DOB: March 08, 1956, 56 y.o.   MRN: [id number] Pt. Sitting up in bed reading, affect brighter, but reports depression at "3" out of 10. Pt. Reports pain at "8" out of 10, but repositions for pain relief until next pain med is due. Pt. 02 patent at 2lpm per nasal canula, no distress noted. Pt. Reports no BM, but had been refusing miralax. Pt. Encouraged to take meds tonight. Staff will continue 1:1 for safety.

## 2011-07-09 NOTE — Tx Team (Signed)
Interdisciplinary Treatment Plan Update (Adult)  Date:  07/09/2011  Time Reviewed:  10:35 AM   Progress in Treatment: Attending groups: No, has been attending few groups Participating in groups:  No, was not engaged in groups he attended over the weekend Taking medication as prescribed:  Yes Tolerating medication: Yes Family/Significant othe contact made:  Weekend staff did family session with husband and son Patient understands diagnosis: Yes Discussing patient identified problems/goals with staff:  Yes Medical problems stabilized or resolved: Yes Denies suicidal/homicidal ideation: Yes Issues/concerns per patient self-inventory:  No  Other:  New problem(s) identified: None  Reason for Continuation of Hospitalization: Anxiety Depression Medication stabilization  Interventions implemented related to continuation of hospitalization:  Medication stabilization, safety checks q 15 mins, group attendance  Additional comments:  Estimated length of stay: 2-3 days  Discharge Plan: Discharge home, follow up with [facility]   New goal(s):  Review of initial/current patient goals per problem list:  1. Goal(s): Reduce SI/thoughts of self harm  Met: Yes  Target date: d/c  As evidenced by: Gladstone Lighter reports no suicidal thoughts today  2. Goal (s): Reduce depression,anxiety, hopelessness/helplessness (all rated at ten today)  Met: No  Target date: d/c  As evidenced by: Gladstone Lighter will rate symptoms at four or below, rating depression/anxiety/hopeless/helpless at 9 today   3. Goal(s): Stabilize on medications  Met: No  Target date: d/c  As evidenced by: Gladstone Lighter will report medications are working - symptoms have decreased   4. Goal(s): Increase self-esteem  Met: Yes  Target date: d/c  As evidenced by: Gladstone Lighter was able to identify positive attributes as a part of lifeskills group      Attendees: Patient:     Family:     Physician:  Dr Orson Aloe, MD  07/09/2011 10:35 AM  Nursing:   Neill Loft, RN 07/09/2011 10:35 AM  Case Manager:  Juline Patch, LCSW 07/09/2011 10:35 AM  Counselor:  Angus Palms, LCSW 07/09/2011 10:35 AM  Other:  Quintella Reichert, RN 07/09/2011 10:35 AM  Other:     Other:     Other:      Scribe for Treatment Team:   [name], 07/09/2011 10:35 AM
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
## 2011-07-09 NOTE — Progress Notes (Addendum)
[facility] Group Notes:  (Counselor/Nursing/MHT/Case Management/Adjunct) 07/09/2011  11:00AM Overcoming Obstacles to Wellness   Type of Therapy:  Group Therapy  Participation Level:  Did Not Attend     [name] 07/09/2011  2:58 PM    [facility] Group Notes:  (Counselor/Nursing/MHT/Case Management/Adjunct) 07/09/2011  1:15PM Distress Tolerance/Urge Surfing   Type of Therapy:  Group Therapy  Participation Level:  Did Not Attend    [name] 07/09/2011  3:55 PM

## 2011-07-09 NOTE — Progress Notes (Signed)
[facility] MD Progress Note  07/09/2011 4:28 PM  S: "My dizzy spell started again today. When I stand up and try to move, my blood pressure shoots up and I feel very dizzy almost about to hit the floor. This is what happened to me at my home when I fell and broke my ribs. When it is happening, I feel like I am drunk, then I will break out in sweats, nauseated with a sickening feeling to my stomach"  Diagnosis:   Axis I: Major depressive disorder, reurrent episode, severe, without mention of psychotic behavior, PTSD, Chronic traumatic encephalopathy. Axis II: Deferred Axis III:  Past Medical History  Diagnosis Date  . Anxiety   . Depression   . Hypothyroidism   . Osteoarthritis   . IBS (irritable bowel syndrome)     with constipation  . Fibromyalgia   . Chronic LBP   . Migraine headache   . Chronic neck pain   . DM type 2 (diabetes mellitus, type 2)   . Hypertension   . COPD (chronic obstructive pulmonary disease)   . H/O suicide attempt    Axis IV: No changes Axis V: 51-60 moderate symptoms  ADL's:  Intact  Sleep: 4.5 per documentation.  Appetite:  Fair  Suicidal Ideation:  Plan:  No Intent:  No Means:  No Homicidal Ideation:  Plan:  No Intent:  No Means:  No  AEB (as evidenced by): Per patient reports.  Mental Status Examination/Evaluation: Objective:  Appearance: Casual  Eye Contact::  Fair  Speech:  Clear and Coherent  Volume:  Normal  Mood:  Depressed  Affect:  Flat  Thought Process:  Coherent  Orientation:  Full  Thought Content:  Rumination  Suicidal Thoughts:  No  Homicidal Thoughts:  No  Memory:  Immediate;   Good Recent;   Good Remote;   Good  Judgement:  Fair  Insight:  Fair  Psychomotor Activity:  Normal  Concentration:  Fair  Recall:  Good  Akathisia:  No  Handed:  Right  AIMS (if indicated):     Assets:  Desire for Improvement  Sleep:  Number of Hours: 4.25    Vital Signs:Blood pressure 131/88, pulse 92, temperature 97.5 F (36.4 C),  temperature source Oral, resp. rate 20, height 5\' 3"  (1.6 m), weight 90.719 kg (200 lb), SpO2 100.00%. Current Medications: Current Facility-Administered Medications  Medication Dose Route Frequency Provider Last Rate Last Dose  . acetaminophen (TYLENOL) tablet 650 mg  650 mg Oral Q6H PRN Viviann Spare, NP      . albuterol (PROVENTIL HFA;VENTOLIN HFA) 108 (90 BASE) MCG/ACT inhaler 2 puff  2 puff Inhalation Q4H PRN [facility] Cerise, MD   2 puff at 07/08/11 0529  . albuterol-ipratropium (COMBIVENT) inhaler 2 puff  2 puff Inhalation TID Viviann Spare, NP   2 puff at 07/09/11 0857  . alum & mag hydroxide-simeth (MAALOX/MYLANTA) 200-200-20 MG/5ML suspension 30 mL  30 mL Oral Q4H PRN Viviann Spare, NP   30 mL at 07/01/11 2219  . amitriptyline (ELAVIL) tablet 25 mg  25 mg Oral QHS Mike Craze, MD   25 mg at 07/08/11 2204  . aspirin EC tablet 81 mg  81 mg Oral QHS Mike Craze, MD   81 mg at 07/08/11 2205  . carbamazepine (TEGRETOL) tablet 200 mg  200 mg Oral TID PRN Mike Craze, MD   200 mg at 07/06/11 2238  . carbamazepine (TEGRETOL) tablet 200 mg  200 mg Oral TID Dorian Heckle  Dan Humphreys, MD   200 mg at 07/09/11 1413  . cholecalciferol (VITAMIN D) tablet 1,000 Units  1,000 Units Oral Daily Mike Craze, MD   1,000 Units at 07/09/11 0859  . diazepam (VALIUM) tablet 2.5 mg  2.5 mg Oral Q6H PRN Mike Craze, MD   2.5 mg at 07/08/11 0436  . feeding supplement (ENSURE) pudding 1 Container  1 Container Oral QPC supper Anastasio Champion, RD      . feeding supplement (ENSURE) pudding 1 Container  1 Container Oral TID WC Mike Craze, MD   1 Container at 07/09/11 1223  . ferrous sulfate tablet 325 mg  325 mg Oral BID WC Mike Craze, MD   325 mg at 07/09/11 0906  . fluticasone (FLOVENT HFA) 44 MCG/ACT inhaler 1 puff  1 puff Inhalation BID Viviann Spare, NP   1 puff at 07/09/11 0857  . guaiFENesin (MUCINEX) 12 hr tablet 600 mg  600 mg Oral BID Sanjuana Kava, NP   600 mg at 07/09/11 0902  .  HYDROmorphone (DILAUDID) tablet 4-8 mg  4-8 mg Oral Q4H PRN Mike Craze, MD   8 mg at 07/08/11 0316  . ibuprofen (ADVIL,MOTRIN) tablet 600 mg  600 mg Oral Q8H PRN Viviann Spare, NP   600 mg at 07/07/11 0307  . insulin aspart (novoLOG) injection 0-15 Units  0-15 Units Subcutaneous TID WC Viviann Spare, NP   2 Units at 07/05/11 1732  . levothyroxine (SYNTHROID, LEVOTHROID) tablet 200 mcg  200 mcg Oral Daily Viviann Spare, NP   200 mcg at 07/09/11 0902  . lidocaine (LIDODERM) 5 % 3 patch  3 patch Transdermal Q24H Mike Craze, MD   3 patch at 07/06/11 1843  . loratadine (CLARITIN) tablet 10 mg  10 mg Oral Daily Sanjuana Kava, NP   10 mg at 07/09/11 0903  . magnesium hydroxide (MILK OF MAGNESIA) suspension 30 mL  30 mL Oral Daily PRN Viviann Spare, NP      . mirtazapine (REMERON) tablet 7.5 mg  7.5 mg Oral QHS Mike Craze, MD   7.5 mg at 07/08/11 2204  . morphine (MS CONTIN) 12 hr tablet 75 mg  75 mg Oral Q8H Mike Craze, MD   75 mg at 07/09/11 1534  . nicotine (NICODERM CQ - dosed in mg/24 hours) patch 21 mg  21 mg Transdermal Daily Mike Craze, MD   21 mg at 07/09/11 0904  . omega-3 acid ethyl esters (LOVAZA) capsule 1 g  1 g Oral BID Mike Craze, MD   1 g at 07/09/11 0905  . pantoprazole (PROTONIX) EC tablet 20 mg  20 mg Oral BID AC Mike Craze, MD   20 mg at 07/09/11 7846  . polyethylene glycol (MIRALAX / GLYCOLAX) packet 17 g  17 g Oral QHS Verne Spurr, PA-C   17 g at 07/07/11 2224  . promethazine (PHENERGAN) tablet 25 mg  25 mg Oral Q6H PRN Mike Craze, MD   25 mg at 07/09/11 0911  . traZODone (DESYREL) tablet 150 mg  150 mg Oral QHS PRN Viviann Spare, NP   150 mg at 07/05/11 2212    Lab Results:  Results for orders placed during the hospital encounter of 07/01/11 (from the past 48 hour(s))  GLUCOSE, CAPILLARY     Status: Normal   Collection Time   07/07/11  5:16 PM      Component Value Range Comment  Glucose-Capillary 81  70 - 99 (mg/dL)    Comment 1  Notify RN     GLUCOSE, CAPILLARY     Status: Normal   Collection Time   07/07/11  8:32 PM      Component Value Range Comment   Glucose-Capillary 84  70 - 99 (mg/dL)    Comment 1 Notify RN     GLUCOSE, CAPILLARY     Status: Normal   Collection Time   07/08/11  6:13 AM      Component Value Range Comment   Glucose-Capillary 96  70 - 99 (mg/dL)   GLUCOSE, CAPILLARY     Status: Abnormal   Collection Time   07/08/11 11:31 AM      Component Value Range Comment   Glucose-Capillary 101 (*) 70 - 99 (mg/dL)    Comment 1 Notify RN     GLUCOSE, CAPILLARY     Status: Abnormal   Collection Time   07/08/11  9:44 PM      Component Value Range Comment   Glucose-Capillary 102 (*) 70 - 99 (mg/dL)   GLUCOSE, CAPILLARY     Status: Abnormal   Collection Time   07/09/11  5:31 AM      Component Value Range Comment   Glucose-Capillary 108 (*) 70 - 99 (mg/dL)   GLUCOSE, CAPILLARY     Status: Abnormal   Collection Time   07/09/11 11:44 AM      Component Value Range Comment   Glucose-Capillary 116 (*) 70 - 99 (mg/dL)    Comment 1 Notify RN       Physical Findings: AIMS:  , ,  ,  ,    CIWA:  CIWA-Ar Total: 5  COWS:  COWS Total Score: 11   Treatment Plan Summary: Daily contact with patient to assess and evaluate symptoms and progress in treatment Medication management. Patient declined an order for a primary care physician consult for recent complaints of severe dizziness.  Asked to try her on some medication first, and re-evaluate after few days.      Plan: Initiate Meclizine 25 mg tid prn for vertigo/dizziness. Encouraged patient to not spend all her time lying in bed as this will contribute to her weakness. Instructed to get out of bed and ambulate from room to the day room at least tid. Continue current treatment plan.  Armandina Stammer I 07/09/2011, 4:28 PM

## 2011-07-10 LAB — GLUCOSE, CAPILLARY
Glucose-Capillary: 103 mg/dL — ABNORMAL HIGH (ref 70–99)
Glucose-Capillary: 112 mg/dL — ABNORMAL HIGH (ref 70–99)
Glucose-Capillary: 113 mg/dL — ABNORMAL HIGH (ref 70–99)
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
## 2011-07-10 NOTE — Progress Notes (Addendum)
0730  Patient laying in bed with eyes open.   Denied SI and HI.   Denied A/V hallucinations.  Stated she is still thinking about what she is going to do with her home situation.  Stated her husband thinks she is coming home.  Contracts for safety.  1:1 present and will continue per MD order. 0930   Patient has been up to group.  Left and went back to bed, stated she was dizzy and felt nauseated.  Medication given to patient for her dizziness/nausea.  Patient has been drinking diet ginger ale.   Back in bed with oxygen at 2L.   Stated she is feeling better now.  1:1 continues per MD order for safety.  Patient has been pleasant and alert this morning. 1200  Patient went to groups this morning.  Declined inhaler and pudding for lunch.  Was given ginger ale and medication for dizziness/nausea this morning, and stated she is feeling better now.  1:1 continues for safety. 1315  Patient sitting on side of bed eating lunch.  1:1 present and pt and MHT are talking about patient's personal problems.  Patient stated she is feeling better today, much better than when she first came to [facility]  Patient denied SI and HI.   Denied A/V hallucinations.  1:1 continues per MD order for safety. 1450   Patient's self inventory sheet, sleeps well, poor appetite, low energy level, good attention span.  Rated depression and hopelessness #8.  Denied withdrawals.  Denied SI.  Has felt pain, dizziness, nausea in past 24 hours.  Pain goal today zero, worst pain #10.  After discharge, plans to keep MD appts, take meds, go to therapy, will not isolate, plans to talk about problems.   1630   1:1 continues.  Patient sitting up in bed, took afternoon medications.  Denied SI and HI.   Denied A/V hallucinations.   No complaints of pain or discomfort noted on patient's face or body movements.  1:1 to continue per MD order for safety.   1700   Patient sitting up in bed.   1:1 present.   Has been to bathroom.  Patient refused ensure puddings, and  lidocaine patches stating the patches do not help her pain.  CBG was 112.  Safety maintained.  Will continue 1:1 for safety per MD order.

## 2011-07-10 NOTE — Progress Notes (Signed)
[facility] MD Progress Note  07/10/2011 8:57 PM  Diagnosis:  Axis I: Major Depression, Recurrent severe, Substance Abuse and Substance Induced Mood Disorder  ADL's:  Intact  Sleep: Poor,   Appetite:  Poor,  Suicidal Ideation:  Pt denies any suicidal thoughts. Homicidal Ideation:  Denies adamantly any homicidal thoughts.  Mental Status Examination/Evaluation: Objective:  Appearance: Casual   Eye Contact::  Minimal  Speech:  Clear and Coherent  Volume:  Normal  Mood:  describes not sure how she feels this AM after walking down the hall and having some dizziness and nausea.  Will start Antivert and see how that does for her. Anxiety: appears fairly calm.  Affect:  Congruent  Thought Process:  Coherent  Orientation:  Full  Thought Content:  WDL  Suicidal Thoughts:  No  Homicidal Thoughts:  No  Memory:  Immediate;   Fair  Judgement:  Fair  Insight:  Fair  Psychomotor Activity:  Normal  Concentration:  Poor  Recall:  Fair  Akathisia:  No  AIMS (if indicated):     Assets:  Communication Skills Desire for Improvement  Sleep:  Number of Hours: 6    ROS: Neuro: had some dizziness today.  Some blood pressure variation.  This seems like it could be central vertigo.  Will try Antivert for this.  GI: some nausea as noted above, but no V/D/Cramps.  MS: R foot and ankle and ribs still bother her, but she is moving around in the bed more making me think that the rib pain is less troublesome for her.  Still on 1:1 for fall risk.  Vital Signs:Blood pressure 122/85, pulse 80, temperature 98 F (36.7 C), temperature source Oral, resp. rate 16, height 5\' 3"  (1.6 m), weight 90.719 kg (200 lb), SpO2 99.00%. Current Medications: Current Facility-Administered Medications  Medication Dose Route Frequency Provider Last Rate Last Dose  . acetaminophen (TYLENOL) tablet 650 mg  650 mg Oral Q6H PRN Viviann Spare, NP      . albuterol (PROVENTIL HFA;VENTOLIN HFA) 108 (90 BASE) MCG/ACT inhaler 2 puff   2 puff Inhalation Q4H PRN Wonda Cerise, MD   2 puff at 07/08/11 0529  . albuterol-ipratropium (COMBIVENT) inhaler 2 puff  2 puff Inhalation TID Viviann Spare, NP   2 puff at 07/10/11 1623  . alum & mag hydroxide-simeth (MAALOX/MYLANTA) 200-200-20 MG/5ML suspension 30 mL  30 mL Oral Q4H PRN Viviann Spare, NP   30 mL at 07/01/11 2219  . amitriptyline (ELAVIL) tablet 25 mg  25 mg Oral QHS Mike Craze, MD   25 mg at 07/09/11 2135  . aspirin EC tablet 81 mg  81 mg Oral QHS Mike Craze, MD   81 mg at 07/09/11 2135  . carbamazepine (TEGRETOL) tablet 200 mg  200 mg Oral TID PRN Mike Craze, MD   200 mg at 07/06/11 2238  . carbamazepine (TEGRETOL) tablet 200 mg  200 mg Oral TID Mike Craze, MD   200 mg at 07/10/11 1358  . cholecalciferol (VITAMIN D) tablet 1,000 Units  1,000 Units Oral Daily Mike Craze, MD   1,000 Units at 07/10/11 [phone]  . diazepam (VALIUM) tablet 2.5 mg  2.5 mg Oral Q6H PRN Mike Craze, MD   2.5 mg at 07/08/11 0436  . feeding supplement (ENSURE) pudding 1 Container  1 Container Oral QPC supper Anastasio Champion, RD      . feeding supplement (ENSURE) pudding 1 Container  1 Container Oral TID WC Dorma Russell  Tawnya Crook, MD   1 Container at 07/09/11 1223  . ferrous sulfate tablet 325 mg  325 mg Oral BID WC Mike Craze, MD   325 mg at 07/10/11 1624  . fluticasone (FLOVENT HFA) 44 MCG/ACT inhaler 1 puff  1 puff Inhalation BID Viviann Spare, NP   1 puff at 07/10/11 1624  . guaiFENesin (MUCINEX) 12 hr tablet 600 mg  600 mg Oral BID Sanjuana Kava, NP   600 mg at 07/10/11 1624  . HYDROmorphone (DILAUDID) tablet 4-8 mg  4-8 mg Oral Q4H PRN Mike Craze, MD   8 mg at 07/08/11 0316  . ibuprofen (ADVIL,MOTRIN) tablet 600 mg  600 mg Oral Q8H PRN Viviann Spare, NP   600 mg at 07/07/11 0307  . insulin aspart (novoLOG) injection 0-15 Units  0-15 Units Subcutaneous TID WC Viviann Spare, NP   2 Units at 07/05/11 1732  . levothyroxine (SYNTHROID, LEVOTHROID) tablet 200 mcg  200 mcg  Oral Daily Viviann Spare, NP   200 mcg at 07/10/11 0855  . lidocaine (LIDODERM) 5 % 3 patch  3 patch Transdermal Q24H Mike Craze, MD   3 patch at 07/06/11 1843  . loratadine (CLARITIN) tablet 10 mg  10 mg Oral Daily Sanjuana Kava, NP   10 mg at 07/10/11 0856  . magnesium hydroxide (MILK OF MAGNESIA) suspension 30 mL  30 mL Oral Daily PRN Viviann Spare, NP      . meclizine (ANTIVERT) tablet 25 mg  25 mg Oral TID PRN Sanjuana Kava, NP   25 mg at 07/10/11 1845  . mirtazapine (REMERON) tablet 7.5 mg  7.5 mg Oral QHS Mike Craze, MD   7.5 mg at 07/09/11 2135  . morphine (MS CONTIN) 12 hr tablet 75 mg  75 mg Oral Q8H Mike Craze, MD   75 mg at 07/10/11 1359  . nicotine (NICODERM CQ - dosed in mg/24 hours) patch 21 mg  21 mg Transdermal Daily Mike Craze, MD   21 mg at 07/10/11 0856  . omega-3 acid ethyl esters (LOVAZA) capsule 1 g  1 g Oral BID Mike Craze, MD   1 g at 07/10/11 1625  . pantoprazole (PROTONIX) EC tablet 20 mg  20 mg Oral BID AC Mike Craze, MD   20 mg at 07/10/11 1625  . polyethylene glycol (MIRALAX / GLYCOLAX) packet 17 g  17 g Oral QHS Verne Spurr, PA-C   17 g at 07/09/11 2134  . promethazine (PHENERGAN) tablet 25 mg  25 mg Oral Q6H PRN Mike Craze, MD   25 mg at 07/10/11 1845  . traZODone (DESYREL) tablet 150 mg  150 mg Oral QHS PRN Viviann Spare, NP   150 mg at 07/05/11 2212    Lab Results:  Results for orders placed during the hospital encounter of 07/01/11 (from the past 48 hour(s))  GLUCOSE, CAPILLARY     Status: Abnormal   Collection Time   07/08/11  9:44 PM      Component Value Range Comment   Glucose-Capillary 102 (*) 70 - 99 (mg/dL)   GLUCOSE, CAPILLARY     Status: Abnormal   Collection Time   07/09/11  5:31 AM      Component Value Range Comment   Glucose-Capillary 108 (*) 70 - 99 (mg/dL)   GLUCOSE, CAPILLARY     Status: Abnormal   Collection Time   07/09/11 11:44 AM  Component Value Range Comment   Glucose-Capillary 116 (*) 70 -  99 (mg/dL)    Comment 1 Notify RN     GLUCOSE, CAPILLARY     Status: Normal   Collection Time   07/09/11  4:34 PM      Component Value Range Comment   Glucose-Capillary 97  70 - 99 (mg/dL)   GLUCOSE, CAPILLARY     Status: Abnormal   Collection Time   07/09/11  9:43 PM      Component Value Range Comment   Glucose-Capillary 106 (*) 70 - 99 (mg/dL)   GLUCOSE, CAPILLARY     Status: Normal   Collection Time   07/10/11  6:36 AM      Component Value Range Comment   Glucose-Capillary 97  70 - 99 (mg/dL)   GLUCOSE, CAPILLARY     Status: Abnormal   Collection Time   07/10/11 12:01 PM      Component Value Range Comment   Glucose-Capillary 103 (*) 70 - 99 (mg/dL)     Physical FindinIMS:  , ,  ,  ,    CIWA:  CIWA-Ar Total: 5  COWS:  COWS Total Score: 11   Treatment Plan Summary: Daily contact with patient to assess and evaluate symptoms and progress in treatment Medication management  Discussion/Plan: Pt calm today. She asked why I started her on Thorazine.  It seems there is no evidence of that.  She is on Tegretol and she can't seem to notice whether it helps of not.  She has noted some of her dizzy spells have been triggered by a smell.  This could suggest that the Temporal Lobe is implicated.  Nurse practitioner states that without acute reproducibly findings, that Neurology will not come for a consult.  She states that seizures run in her family.  Will continue current course in treating the brain with Tegretol.  Mosie Angus 07/10/2011, 8:57 PM

## 2011-07-10 NOTE — Progress Notes (Addendum)
[facility] Group Notes: (Counselor/Nursing/MHT/Case Management/Adjunct) 07/10/2011   @ 11:00 Feelings About Diagnosis  Type of Therapy:  Group Therapy  Participation Level:  Limited  Participation Quality: Attentive, Appropriate   Affect:  Blunted  Cognitive:  Appropriate  Insight:  Minimal  Engagement in Group: Limted  Engagement in Therapy:  Minimal  Modes of Intervention:  Support and Exploration  Summary of Progress/Problems: [name] Fitzpatrick was not very talkative in this group, stating that she was very engaged in processing losses in the last group and had a low level of energy left for this one. She did identify some emotions that she has felt about/during her depression, including helpless and hopeless. [name] Fitzpatrick related well to other group members who shared about feeling apathetic and giving up on her life changing.   [name] 07/10/2011  3:15 PM       [facility] Group Notes:  (Counselor/Nursing/MHT/Case Management/Adjunct) 07/10/2011  1:15pm Diagnosis education   Type of Therapy:  Group Therapy  Participation Level:  Did Not Attend    [name] 07/10/2011  2:32 PM

## 2011-07-11 LAB — GLUCOSE, CAPILLARY
Glucose-Capillary: 99 mg/dL (ref 70–99)
Glucose-Capillary: 89 mg/dL (ref 70–99)

## 2011-07-11 LAB — CARBAMAZEPINE LEVEL, TOTAL: Carbamazepine Lvl: 9.4 ug/mL (ref 4.0–12.0)

## 2011-07-11 MED ORDER — MORPHINE SULFATE CR 30 MG PO TB12
90.0000 mg | ORAL_TABLET | Freq: Three times a day (TID) | ORAL | Status: DC
  Administered 2011-07-11 – 2011-07-13 (×7): 90 mg via ORAL
  Filled 2011-07-11 (×7): qty 3

## 2011-07-11 MED ORDER — MECLIZINE HCL 25 MG PO TABS
50.0000 mg | ORAL_TABLET | Freq: Four times a day (QID) | ORAL | Status: DC | PRN
  Filled 2011-07-11: qty 12
  Filled 2011-07-11: qty 2

## 2011-07-11 NOTE — Progress Notes (Addendum)
1400  1:1 Note: Pt denies SI/HI/AVH. Pt resting in bed not interacting. Pt respirations even and unlabored. No distress noted at this time. Pt isolative. Sitter at [employer]. Support and encouragement offered. 1:1 continued for pt safety. Pt remains safe.   1800 1:1 Note. Pt resting in bed eyes closed, respirations even and unlabored. No distress noted at this time. Pt has been isolative today. Sitter at bed side. 1:1 observation continued for safety. Pt remains safe.
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
## 2011-07-11 NOTE — Progress Notes (Signed)
Patient seen during discharge planning group.  She reports not doing well and being frustrated over medications.  Patient rates depression, anxiety, hopelessness and helplessness all at ten.  She denies SI/HI.  She advised of being very upset with husband who she does not believe is concerned about her illness.

## 2011-07-11 NOTE — Progress Notes (Signed)
Pt denies SI/HI/AVH. Per pt report, she slept well. Pt's appetite was poor today. Pt slept for the majority of the day. Pt unwilling to participate in groups and remained isolative. Pt rates her energy level low. She rates depression and hopelessness both a 10. Pt wrote on her self inventory "Leave me alone today. I'm not responsible for what I say. Just want to be left alone." Support and encouragement offered. 1:1 continued for pt safety. Pt remains free of falls and safe on the unit.

## 2011-07-11 NOTE — Progress Notes (Addendum)
0730   Patient sitting on side of her bed eating breakfast with 1:1 present.   Patient denied SI and HI.   Denied A/V hallucinations.  Stated she slept good last night.  No signs of pain/distress noted by nurse at this time.  Respirations even and unlabored.  1:1 will continue per MD order. 1000   Patient returned from group accompanied by 1:1, sitting on side of bed.  Took her meds this morning without difficulty.    Was given diet ginger ale with meds this morning per pt's request.   Stated she wants to be left alone or she may say something she does not want to.   Some days she just wants to be left alone and this is one of those days.   1030  Woke patient to give her dilaudid and valium per her request.  Also gave patient heat pack for rib area.  Patient wants to be left alone today.   1:1 present for safety.  Safety maintained.

## 2011-07-11 NOTE — Progress Notes (Signed)
[facility] Group Notes:  (Counselor/Nursing/MHT/Case Management/Adjunct) 07/11/2011  11:00AM Emotion Regulation   Type of Therapy:  Group Therapy  Participation Level:  Did Not Attend      [name] 07/11/2011  2:51 PM      [facility] Group Notes:  (Counselor/Nursing/MHT/Case Management/Adjunct) 07/11/2011  1:15pm Breathing Techniques and Meditation   Type of Therapy:  Group Therapy  Participation Level:  Did Not Attend     [name] 07/11/2011  2:57 PM

## 2011-07-12 LAB — GLUCOSE, CAPILLARY
Glucose-Capillary: 113 mg/dL — ABNORMAL HIGH (ref 70–99)
Glucose-Capillary: 125 mg/dL — ABNORMAL HIGH (ref 70–99)

## 2011-07-12 MED ORDER — CARBAMAZEPINE 200 MG PO TABS: 300.0000 mg | ORAL_TABLET | Freq: Three times a day (TID) | ORAL | Status: DC | PRN

## 2011-07-12 NOTE — Progress Notes (Signed)
[facility] MD Progress Note  07/12/2011 3:20 PM  S: "I feel ok today. I believe my pain medication is not lasting me at all. It wears off in time. I am not ready to go home yet, at least not today. I will see how I am feeling tomorrow about going home."  O: patient observed sitting up on her bedside eating and charting with her 1:1 sitter.  She presents with good affect, well groomed with good eye contact.   Tegretol levels at 9.4.  Diagnosis:   Axis I: Major depressive disorder, recurrent episode, severe without mention of psychotic features., PTSD, Chronic traumatic encephalopatthy. Axis II: Deferred Axis III:  Past Medical History  Diagnosis Date  . Anxiety   . Depression   . Hypothyroidism   . Osteoarthritis   . IBS (irritable bowel syndrome)     with constipation  . Fibromyalgia   . Chronic LBP   . Migraine headache   . Chronic neck pain   . DM type 2 (diabetes mellitus, type 2)   . Hypertension   . COPD (chronic obstructive pulmonary disease)   . H/O suicide attempt    Axis IV: No changes Axis V: 51-60 moderate symptoms  ADL's:  Intact  Sleep: Fair  Appetite:  Fair  Suicidal Ideation:  Plan:  No Intent:  No Means:  No Homicidal Ideation:  Plan:  No Intent:  No Means:  no  AEB (as evidenced by):Per patient's reports  Mental Status Examination/Evaluation: Objective:  Appearance: Casual  Eye Contact::  Good  Speech:  Clear and Coherent  Volume:  Normal  Mood:  Euthymic  Affect:  Appropriate  Thought Process:  Coherent  Orientation:  Full  Thought Content:  Rumination  Suicidal Thoughts:  No  Homicidal Thoughts:  No  Memory:  Immediate;   Good Recent;   Good Remote;   Good  Judgement:  Fair  Insight:  Fair  Psychomotor Activity:  Normal  Concentration:  Fair  Recall:  Fair  Akathisia:  No  Handed:  Right  AIMS (if indicated):     Assets:  Desire for Improvement  Sleep:  Number of Hours: 5.5    Vital Signs:Blood pressure 107/72, pulse 85,  temperature 98.2 F (36.8 C), temperature source Oral, resp. rate 17, height 5\' 3"  (1.6 m), weight 90.719 kg (200 lb), SpO2 96.00%. Current Medications: Current Facility-Administered Medications  Medication Dose Route Frequency Provider Last Rate Last Dose  . acetaminophen (TYLENOL) tablet 650 mg  650 mg Oral Q6H PRN Viviann Spare, NP      . albuterol (PROVENTIL HFA;VENTOLIN HFA) 108 (90 BASE) MCG/ACT inhaler 2 puff  2 puff Inhalation Q4H PRN Wonda Cerise, MD   2 puff at 07/08/11 0529  . albuterol-ipratropium (COMBIVENT) inhaler 2 puff  2 puff Inhalation TID Viviann Spare, NP   2 puff at 07/12/11 1203  . alum & mag hydroxide-simeth (MAALOX/MYLANTA) 200-200-20 MG/5ML suspension 30 mL  30 mL Oral Q4H PRN Viviann Spare, NP   30 mL at 07/01/11 2219  . amitriptyline (ELAVIL) tablet 25 mg  25 mg Oral QHS Mike Craze, MD   25 mg at 07/11/11 2202  . aspirin EC tablet 81 mg  81 mg Oral QHS Mike Craze, MD   81 mg at 07/11/11 2201  . carbamazepine (TEGRETOL) tablet 200 mg  200 mg Oral TID PRN Mike Craze, MD   200 mg at 07/12/11 1209  . carbamazepine (TEGRETOL) tablet 200 mg  200 mg Oral  TID Mike Craze, MD   200 mg at 07/12/11 1434  . cholecalciferol (VITAMIN D) tablet 1,000 Units  1,000 Units Oral Daily Mike Craze, MD   1,000 Units at 07/12/11 [phone]  . diazepam (VALIUM) tablet 2.5 mg  2.5 mg Oral Q6H PRN Mike Craze, MD   2.5 mg at 07/11/11 1031  . feeding supplement (ENSURE) pudding 1 Container  1 Container Oral QPC supper Anastasio Champion, RD      . feeding supplement (ENSURE) pudding 1 Container  1 Container Oral TID WC Mike Craze, MD   1 Container at 07/09/11 1223  . ferrous sulfate tablet 325 mg  325 mg Oral BID WC Mike Craze, MD   325 mg at 07/12/11 0848  . fluticasone (FLOVENT HFA) 44 MCG/ACT inhaler 1 puff  1 puff Inhalation BID Viviann Spare, NP   1 puff at 07/12/11 0844  . guaiFENesin (MUCINEX) 12 hr tablet 600 mg  600 mg Oral BID Sanjuana Kava, NP   600 mg  at 07/12/11 0849  . HYDROmorphone (DILAUDID) tablet 4-8 mg  4-8 mg Oral Q4H PRN Mike Craze, MD   4 mg at 07/12/11 1226  . ibuprofen (ADVIL,MOTRIN) tablet 600 mg  600 mg Oral Q8H PRN Viviann Spare, NP   600 mg at 07/07/11 0307  . insulin aspart (novoLOG) injection 0-15 Units  0-15 Units Subcutaneous TID WC Viviann Spare, NP   2 Units at 07/05/11 1732  . levothyroxine (SYNTHROID, LEVOTHROID) tablet 200 mcg  200 mcg Oral Daily Viviann Spare, NP   200 mcg at 07/12/11 0849  . lidocaine (LIDODERM) 5 % 3 patch  3 patch Transdermal Q24H Mike Craze, MD   3 patch at 07/06/11 1843  . loratadine (CLARITIN) tablet 10 mg  10 mg Oral Daily Sanjuana Kava, NP   10 mg at 07/12/11 0850  . magnesium hydroxide (MILK OF MAGNESIA) suspension 30 mL  30 mL Oral Daily PRN Viviann Spare, NP      . meclizine (ANTIVERT) tablet 50 mg  50 mg Oral QID PRN Mike Craze, MD      . mirtazapine (REMERON) tablet 7.5 mg  7.5 mg Oral QHS Mike Craze, MD   7.5 mg at 07/11/11 2201  . morphine (MS CONTIN) 12 hr tablet 90 mg  90 mg Oral Q8H Mike Craze, MD   90 mg at 07/12/11 1440  . nicotine (NICODERM CQ - dosed in mg/24 hours) patch 21 mg  21 mg Transdermal Daily Mike Craze, MD   21 mg at 07/12/11 0847  . omega-3 acid ethyl esters (LOVAZA) capsule 1 g  1 g Oral BID Mike Craze, MD   1 g at 07/12/11 0850  . pantoprazole (PROTONIX) EC tablet 20 mg  20 mg Oral BID AC Mike Craze, MD   20 mg at 07/12/11 [phone]  . polyethylene glycol (MIRALAX / GLYCOLAX) packet 17 g  17 g Oral QHS Verne Spurr, PA-C   17 g at 07/11/11 2204  . promethazine (PHENERGAN) tablet 25 mg  25 mg Oral Q6H PRN Mike Craze, MD   25 mg at 07/12/11 1208  . traZODone (DESYREL) tablet 150 mg  150 mg Oral QHS PRN Viviann Spare, NP   150 mg at 07/11/11 2305    Lab Results:  Results for orders placed during the hospital encounter of 07/01/11 (from the past 48 hour(s))  GLUCOSE, CAPILLARY  Status: Abnormal   Collection Time    07/10/11  4:59 PM      Component Value Range Comment   Glucose-Capillary 112 (*) 70 - 99 (mg/dL)   GLUCOSE, CAPILLARY     Status: Abnormal   Collection Time   07/10/11  9:19 PM      Component Value Range Comment   Glucose-Capillary 113 (*) 70 - 99 (mg/dL)   GLUCOSE, CAPILLARY     Status: Normal   Collection Time   07/11/11  6:28 AM      Component Value Range Comment   Glucose-Capillary 99  70 - 99 (mg/dL)   GLUCOSE, CAPILLARY     Status: Normal   Collection Time   07/11/11 11:27 AM      Component Value Range Comment   Glucose-Capillary 89  70 - 99 (mg/dL)   GLUCOSE, CAPILLARY     Status: Abnormal   Collection Time   07/11/11  5:11 PM      Component Value Range Comment   Glucose-Capillary 106 (*) 70 - 99 (mg/dL)   CARBAMAZEPINE LEVEL, TOTAL     Status: Normal   Collection Time   07/11/11  7:30 PM      Component Value Range Comment   Carbamazepine Lvl 9.4  4.0 - 12.0 (ug/mL)   GLUCOSE, CAPILLARY     Status: Abnormal   Collection Time   07/11/11  9:12 PM      Component Value Range Comment   Glucose-Capillary 100 (*) 70 - 99 (mg/dL)    Comment 1 Notify RN     GLUCOSE, CAPILLARY     Status: Abnormal   Collection Time   07/12/11  6:17 AM      Component Value Range Comment   Glucose-Capillary 101 (*) 70 - 99 (mg/dL)   GLUCOSE, CAPILLARY     Status: Normal   Collection Time   07/12/11 11:59 AM      Component Value Range Comment   Glucose-Capillary 92  70 - 99 (mg/dL)     Physical Findings: AIMS:  , ,  ,  ,    CIWA:  CIWA-Ar Total: 5  COWS:  COWS Total Score: 11   Treatment Plan Summary: Daily contact with patient to assess and evaluate symptoms and progress in treatment Medication management  Plan: Increase Tegretol to 300 mg tid prn.           Continue current treatment plan.        Armandina Stammer I 07/12/2011, 3:20 PM

## 2011-07-12 NOTE — Progress Notes (Addendum)
0730  Patient laying in bed opening and closing eyes, half asleep.  Head of bed up.  Oxygen @2L .   Respirations even and unlabored.   No signs of pain or distress noted on patient's face or body movements.  Safety maintained.  1:1 present and will continue for safety per MD order. 4098  Patient sitting up in bed without oxygen.   Talking to MHT.  Denied SI and HI.  Denied A/V hallucinations.  1:1 present and safety maintained.  Respirations even and unlabored.  1:1 continues per MD order. 1200   Patient sitting on side of bed.  Respirations even and unlabored.  No signs of pain/distress noted on patient's face/body movements.  1:1 continues.  1430  Patient took afternoon meds without difficulty.  Has been talking to N.P. And MHT.  Patient ate lunch in her room and rested.   Respirations even and unlabored.   No signs of pain/distress noted on patient's face/body movements.  1:1 continues per MD order for safety. 1800  Patient took meds without difficulty.   Sitting on side of bed talking to MHT.   Not using her oxygen at this time.  Patient refused her inhalers, ensure pudding and patches, stating she does not need these medications.  1:1 continues for safety.  Safety maintained.   Denied SI and HI.   Denied A/V hallucinations.  On patient's self inventory sheet, patient has poor sleep, poor appetite, low energy level, good attention span.   Rated depression and hopelessness #7.  Denied SI.  Has felt lightheaded, pain, dizziness, blurred vision in past 24 hours.  Zero pain goal today, worst pain #8.   After discharge, plans to get out of house more, stay busy and do not get depressed.  Wants to keep pain and depression leveled out.  Will have problems with meds after discharge if they are too expense.  Some co pays are way too high for her.

## 2011-07-12 NOTE — Progress Notes (Addendum)
2130 1:1 Note: Pt currently in dayroom requesting a snack from this Clinical research associate. Pt was given some cookies and milk per request.  Pt reports a low intake of food today. Pt cbg was 125 this evening. Writer offer to give pt meds at this time. Pt opted to receive meds after I return from my awaiting admission. Pt has been observed interacting with staff and other pts approprietly. Pt has been ambulating with the use of a cane instead of her wheelchair. Pt is denying SI at this time. Pt affect has lifted. Continued support and availability as needed was extended to this pt. Pt safety remains with q15 min checks.

## 2011-07-13 LAB — GLUCOSE, CAPILLARY: Glucose-Capillary: 84 mg/dL (ref 70–99)

## 2011-07-13 MED ORDER — HYDROMORPHONE HCL 4 MG PO TABS: 4.0000 mg | ORAL_TABLET | ORAL | Status: DC | PRN

## 2011-07-13 MED ORDER — MORPHINE SULFATE CR 30 MG PO TB12: 90.0000 mg | ORAL_TABLET | Freq: Three times a day (TID) | ORAL | Status: AC

## 2011-07-13 MED ORDER — MIRTAZAPINE 7.5 MG PO TABS: 7.5000 mg | ORAL_TABLET | Freq: Every day | ORAL | Status: DC

## 2011-07-13 MED ORDER — IPRATROPIUM-ALBUTEROL 18-103 MCG/ACT IN AERO: 2.0000 | INHALATION_SPRAY | Freq: Three times a day (TID) | RESPIRATORY_TRACT | Status: DC

## 2011-07-13 MED ORDER — CARBAMAZEPINE 200 MG PO TABS: 300.0000 mg | ORAL_TABLET | Freq: Three times a day (TID) | ORAL | Status: DC | PRN

## 2011-07-13 MED ORDER — FLUTICASONE PROPIONATE HFA 44 MCG/ACT IN AERO: 1.0000 | INHALATION_SPRAY | Freq: Two times a day (BID) | RESPIRATORY_TRACT | Status: DC

## 2011-07-13 MED ORDER — MECLIZINE HCL 50 MG PO TABS: 50.0000 mg | ORAL_TABLET | Freq: Four times a day (QID) | ORAL | Status: AC | PRN

## 2011-07-13 MED ORDER — FERROUS SULFATE 325 (65 FE) MG PO TABS: 325.0000 mg | ORAL_TABLET | Freq: Two times a day (BID) | ORAL | Status: DC

## 2011-07-13 MED ORDER — TRAZODONE HCL 50 MG PO TABS
150.0000 mg | ORAL_TABLET | Freq: Every evening | ORAL | Status: DC | PRN
  Filled 2011-07-13: qty 42

## 2011-07-13 MED ORDER — ASPIRIN 81 MG PO TBEC: 81.0000 mg | DELAYED_RELEASE_TABLET | Freq: Every day | ORAL | Status: DC

## 2011-07-13 MED ORDER — DIAZEPAM 5 MG PO TABS: 2.5000 mg | ORAL_TABLET | Freq: Four times a day (QID) | ORAL | Status: AC | PRN

## 2011-07-13 MED ORDER — DIAZEPAM 5 MG PO TABS: 10.0000 mg | ORAL_TABLET | Freq: Every day | ORAL | Status: DC

## 2011-07-13 MED ORDER — CARBAMAZEPINE 200 MG PO TABS
300.0000 mg | ORAL_TABLET | Freq: Three times a day (TID) | ORAL | Status: DC
  Filled 2011-07-13: qty 63

## 2011-07-13 MED ORDER — POLYETHYLENE GLYCOL 3350 17 G PO PACK: 25.0000 g | PACK | Freq: Every day | ORAL | Status: DC | PRN

## 2011-07-13 MED ORDER — TRAZODONE HCL 150 MG PO TABS: 150.0000 mg | ORAL_TABLET | Freq: Every evening | ORAL | Status: DC | PRN

## 2011-07-13 MED ORDER — LEVOTHYROXINE SODIUM 200 MCG PO TABS: 200.0000 ug | ORAL_TABLET | Freq: Every day | ORAL | Status: DC

## 2011-07-13 MED ORDER — DIAZEPAM 5 MG PO TABS: 2.5000 mg | ORAL_TABLET | Freq: Four times a day (QID) | ORAL | Status: DC | PRN

## 2011-07-13 MED ORDER — AMITRIPTYLINE HCL 25 MG PO TABS: 25.0000 mg | ORAL_TABLET | Freq: Every day | ORAL | Status: DC

## 2011-07-13 MED ORDER — INSULIN GLARGINE 100 UNIT/ML ~~LOC~~ SOLN: 10.0000 [IU] | Freq: Every day | SUBCUTANEOUS | Status: DC

## 2011-07-13 MED ORDER — OMEGA-3-ACID ETHYL ESTERS 1 G PO CAPS: 1.0000 g | ORAL_CAPSULE | Freq: Two times a day (BID) | ORAL | Status: DC

## 2011-07-13 MED ORDER — VITAMIN D 1000 UNITS PO CAPS: 1000.0000 [IU] | ORAL_CAPSULE | Freq: Every day | ORAL | Status: DC

## 2011-07-13 NOTE — Tx Team (Addendum)
Interdisciplinary Treatment Plan Update (Adult)  Date:  07/13/2011  Time Reviewed:  8:11 AM Progress in Treatment:  Attending groups: No, has been saying groups are too crowded Participating in groups: No Taking medication as prescribed: Yes  Tolerating medication: Yes  Family/Significant othe contact made: Weekend staff did family session with husband and son  Patient understands diagnosis: Yes  Discussing patient identified problems/goals with staff: Yes  Medical problems stabilized or resolved: Yes  Denies suicidal/homicidal ideation: Yes  Issues/concerns per patient self-inventory: No  Other:   New problem(s) identified: None   Reason for Continuation of Hospitalization:  Anxiety  Depression  Medication stabilization   Interventions implemented related to continuation of hospitalization: Medication stabilization, safety checks q 15 mins, group attendance   Additional comments:   Estimated length of stay: 2-3 days   Discharge Plan: Discharge home, follow up with [facility]   New goal(s):   Review of initial/current patient goals per problem list:  1. Goal(s): Reduce SI/thoughts of self harm  Met: Yes  Target date: d/c  As evidenced by: [name] Fitzpatrick reports no suicidal thoughts today   2. Goal (s): Reduce depression,anxiety, hopelessness/helplessness (all rated at ten on admission)  Met: Yes Target date: d/c  As evidenced by: [name] Fitzpatrick will rate symptoms at four or below, rating depression/anxiety/hopeless/helpless at  2 today  3. Goal(s): Stabilize on medications  Met: Yes Target date: d/c  As evidenced by: [name] Fitzpatrick reports medications are working - no intolerable side effects    4. Goal(s): Increase self-esteem  Met: Yes  Target date: d/c  As evidenced by: [name] Fitzpatrick was able to identify positive attributes as a part of lifeskills group    Attendees: Patient:     Family:     Physician:     Nursing:   Tacy Learn, RN 07/13/2011 10:39 AM     Case Manager:  Juline Patch, LCSW 07/13/2011 10:39 AM   Counselor:  Angus Palms, LCSW 07/13/2011 10:39 AM   Other:  Quintella Reichert, RN 07/13/2011 10:39 AM   Other:     Other:     Other:      Scribe for Treatment Team:   [name], 07/13/2011 8:11 AM

## 2011-07-13 NOTE — Progress Notes (Signed)
07/13/2011 Nursing 1600 [name] Fitzpatrick is  Seen...standing at the nursing station, preparing for her DC. She denies SI. She denies audit, viws, tactile halluc. Her AVS is reviewed and she signs it, agreeing that she understands and will comply with DC f/u. She uis given med samles a s well as her inhalers from her med drawer. All belongings in her locker are returned to her and suicide risk assessment is completed and charted. She is assisted to get her bags of blongings outside the building and is DC'[name] home per MD order. PD RN [facility]

## 2011-07-13 NOTE — Progress Notes (Signed)
[facility] Group Notes: (Counselor/Nursing/MHT/Case Management/Adjunct) 07/13/2011   @11 :00am Preventing Relapse  Type of Therapy:  Group Therapy  Participation Level:  Limited  Participation Quality:  Attentive, Appropriate   Affect:  Appropriate  Cognitive:  Appropriate  Insight:  Limited  Engagement in Group: Limited  Engagement in Therapy:Limited    Modes of Intervention:  Support and Exploration  Summary of Progress/Problems: [name] Fitzpatrick did not contribute to the conversation about protective factors and triggers for relapse. She did, however, agree with others who talked about being a caregiver and never taking care of themselves as a trigger.   [name] 06/22/2011 12:23 PM
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
## 2011-07-13 NOTE — Progress Notes (Signed)
[facility] Group Notes:  (Counselor/Nursing/MHT/Case Management/Adjunct) 07/12/2011  11:00am [facility]   Type of Therapy:  Group Therapy  Participation Level:  Did Not Attend  [name] Fitzpatrick 07/12/2011  12:33pm

## 2011-07-13 NOTE — Progress Notes (Signed)
0530 Pt in bed laying supine eyes closed asleep. Pt respirations are clear and unlabored with 2 L/min O2 via nasal cannula. 1:1 remains for this pt. Pt remains safe with 1:1 observation.
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
## 2011-07-13 NOTE — Progress Notes (Addendum)
0130: Pt asleep with eyes closed. Pt respirations clear and even with 2L /min of O2 via nasal cannula. Pt appears to be in no signs of distress at this time. 1:1 continues for this pt. Pt remains safe.
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
## 2011-07-17 NOTE — Progress Notes (Signed)
Patient Discharge Instructions:  Psychiatric Admission Assessment Note Provided,  07/16/2011 After Visit Summary (AVS) Provided,  07/16/2011 Face Sheet Provided, 07/16/2011 Faxed/Sent to the Next Level Care provider:  07/16/2011 Provided suicide Risk Assessment - Discharge Assessment 07/16/2011  Faxed to [facility] @ [phone] And Faxed to [facility] @ [phone]  Wandra Scot, 07/17/2011, 5:15 PM

## 2011-08-20 ENCOUNTER — Other Ambulatory Visit: Payer: Self-pay | Admitting: Family Medicine

## 2011-08-28 ENCOUNTER — Encounter: Payer: Self-pay | Admitting: Family Medicine

## 2011-08-28 ENCOUNTER — Ambulatory Visit (INDEPENDENT_AMBULATORY_CARE_PROVIDER_SITE_OTHER): Payer: Medicare Other | Admitting: Family Medicine

## 2011-08-28 VITALS — BP 110/72 | HR 82 | Resp 18 | Ht 62.5 in | Wt 215.1 lb

## 2011-08-28 DIAGNOSIS — E039 Hypothyroidism, unspecified: Secondary | ICD-10-CM

## 2011-08-28 DIAGNOSIS — H6090 Unspecified otitis externa, unspecified ear: Secondary | ICD-10-CM

## 2011-08-28 DIAGNOSIS — M545 Low back pain, unspecified: Secondary | ICD-10-CM

## 2011-08-28 DIAGNOSIS — F332 Major depressive disorder, recurrent severe without psychotic features: Secondary | ICD-10-CM

## 2011-08-28 DIAGNOSIS — H60399 Other infective otitis externa, unspecified ear: Secondary | ICD-10-CM

## 2011-08-28 DIAGNOSIS — N951 Menopausal and female climacteric states: Secondary | ICD-10-CM

## 2011-08-28 DIAGNOSIS — R911 Solitary pulmonary nodule: Secondary | ICD-10-CM

## 2011-08-28 DIAGNOSIS — R7301 Impaired fasting glucose: Secondary | ICD-10-CM

## 2011-08-28 LAB — HEMOGLOBIN A1C
Hgb A1c MFr Bld: 5.7 % — ABNORMAL HIGH (ref <5.7)
Mean Plasma Glucose: 117 mg/dL — ABNORMAL HIGH (ref <117)

## 2011-08-28 MED ORDER — SULFAMETHOXAZOLE-TRIMETHOPRIM 800-160 MG PO TABS: 1.0000 | ORAL_TABLET | Freq: Two times a day (BID) | ORAL | Status: AC

## 2011-08-28 MED ORDER — ESTROGENS, CONJUGATED 0.625 MG/GM VA CREA: TOPICAL_CREAM | VAGINAL | Status: AC

## 2011-08-28 NOTE — Patient Instructions (Addendum)
F/u end September.  HBA1C today.  Antibiotic is prescribed for 10 days for ear infection, call for ENT referral if you continue to have smelly drainage form the ears on completion of treatment.  STOP using Qtips please   Premarin is prescribed to be used SPARINGLY, fingertip amount 3 times weekly , as neeeded for vaginal dryness and wellbeing. Remember that any form of estrogen may increase your risk of breast cancer, so use the least amount for the least time  Mammogram to be scheduled on the way out

## 2011-08-28 NOTE — Progress Notes (Signed)
  Subjective:    Patient ID: [name] Fitzpatrick, female    DOB: 01/04/1956, 56 y.o.   MRN: [id number]  HPI The PT is here for follow up and re-evaluation of chronic medical conditions, medication management and review of any available recent lab and radiology data.  Preventive health is updated, specifically  Cancer screening and Immunization.   Questions or concerns regarding consultations or procedures which the PT has had in the interim are  addressed. The PT denies any adverse reactions to current medications since the last visit.  C/o left ear pain and foul smelling drainage in te past week, she does use q tips       Review of Systems See HPI Denies recent fever or chills. Denies sinus pressure, nasal congestion,  or sore throat. Denies chest congestion, productive cough or wheezing. Denies chest pains, palpitations and leg swelling Denies abdominal pain, nausea, vomiting,diarrhea or constipation.   Denies dysuria, frequency, hesitancy or incontinence. Chronic  joint pain, swelling and limitation in mobility. Denies headaches, seizures, numbness, or tingling. Denies uncontrolled  depression, anxiety or insomnia. Denies skin break down or rash.        Objective:   Physical Exam Patient alert and oriented and in no cardiopulmonary distress.  HEENT: No facial asymmetry, EOMI, no sinus tenderness,  oropharynx pink and moist.  Neck supple no adenopathy.Left TM erythematous with poor light reflex   Chest: Clear to auscultation bilaterally.Decreased air entry throughout   CVS: S1, S2 no murmurs, no S3.  ABD: Soft non tender. Bowel sounds normal.  Ext: No edema  MS: decreased  ROM spine, shoulders, hips and knees.  Skin: Intact, no ulcerations or rash noted.  Psych: Good eye contact, normal affect. Memory intact not anxious or depressed appearing.  CNS: CN 2-12 intact, power, tone and sensation normal throughout.        Assessment & Plan:

## 2011-09-03 ENCOUNTER — Ambulatory Visit (HOSPITAL_COMMUNITY)
Admission: RE | Admit: 2011-09-03 | Discharge: 2011-09-03 | Disposition: A | Discharge status: Home / Self Care | Payer: Medicare Other | Source: Ambulatory Visit | Attending: Family Medicine | Admitting: Family Medicine

## 2011-09-03 DIAGNOSIS — Z139 Encounter for screening, unspecified: Secondary | ICD-10-CM

## 2011-09-09 ENCOUNTER — Telehealth: Payer: Self-pay | Admitting: Family Medicine

## 2011-09-09 NOTE — Assessment & Plan Note (Signed)
Currently stable and follows with psych

## 2011-09-09 NOTE — Assessment & Plan Note (Signed)
Updated lab needed to asses control

## 2011-09-09 NOTE — Assessment & Plan Note (Signed)
Chronic pain management  Through pain management

## 2011-09-09 NOTE — Assessment & Plan Note (Signed)
Antibiotic course prescribed and pt advised against use of qtips

## 2011-09-09 NOTE — Assessment & Plan Note (Signed)
Pt reports symptoms are diabling and despite increased risk of breast cancer potentially, she wants to use topical prep sparingly

## 2011-09-09 NOTE — Telephone Encounter (Signed)
Pt needs chest Ct without contrast to f/u nodule, prefers afternoon appt. This is very important , pls sched and let her know,  Also no mammogram has been done as yet , pls sched her mammogram around the same time please

## 2011-09-09 NOTE — Assessment & Plan Note (Signed)
Ongoing nicotine use , rept chest CT scan past due sinc 04/2011, rept scan will be requested

## 2011-09-11 ENCOUNTER — Telehealth: Payer: Self-pay | Admitting: Family Medicine

## 2011-09-11 ENCOUNTER — Other Ambulatory Visit: Payer: Self-pay | Admitting: Family Medicine

## 2011-09-11 DIAGNOSIS — H6690 Otitis media, unspecified, unspecified ear: Secondary | ICD-10-CM

## 2011-09-11 NOTE — Telephone Encounter (Signed)
pls refer pt to ENT due to painful draining ear

## 2011-09-12 ENCOUNTER — Ambulatory Visit (HOSPITAL_COMMUNITY)
Admission: RE | Admit: 2011-09-12 | Discharge: 2011-09-12 | Disposition: A | Discharge status: Home / Self Care | Payer: Medicare Other | Source: Ambulatory Visit | Attending: Family Medicine | Admitting: Family Medicine

## 2011-09-12 ENCOUNTER — Ambulatory Visit (HOSPITAL_COMMUNITY): Payer: Medicare Other

## 2011-09-12 DIAGNOSIS — N63 Unspecified lump in unspecified breast: Secondary | ICD-10-CM

## 2011-09-12 NOTE — Telephone Encounter (Signed)
I spoke with the pt directly, explained reason for test and have referred her to ENt, pls do referral

## 2011-09-14 ENCOUNTER — Ambulatory Visit (HOSPITAL_COMMUNITY)
Admission: RE | Admit: 2011-09-14 | Discharge: 2011-09-14 | Disposition: A | Discharge status: Home / Self Care | Payer: Medicare Other | Source: Ambulatory Visit | Attending: Family Medicine | Admitting: Family Medicine

## 2011-09-14 DIAGNOSIS — R911 Solitary pulmonary nodule: Secondary | ICD-10-CM

## 2011-09-14 DIAGNOSIS — F17200 Nicotine dependence, unspecified, uncomplicated: Secondary | ICD-10-CM | POA: Insufficient documentation

## 2011-09-14 DIAGNOSIS — Z09 Encounter for follow-up examination after completed treatment for conditions other than malignant neoplasm: Secondary | ICD-10-CM | POA: Insufficient documentation

## 2011-09-17 ENCOUNTER — Telehealth: Payer: Self-pay | Admitting: Family Medicine

## 2011-09-28 ENCOUNTER — Encounter: Payer: Self-pay | Admitting: Family Medicine

## 2011-09-28 ENCOUNTER — Ambulatory Visit (INDEPENDENT_AMBULATORY_CARE_PROVIDER_SITE_OTHER): Payer: Medicare Other | Admitting: Family Medicine

## 2011-09-28 VITALS — BP 110/74 | HR 81 | Resp 16 | Ht 62.5 in | Wt 213.4 lb

## 2011-09-28 DIAGNOSIS — J329 Chronic sinusitis, unspecified: Secondary | ICD-10-CM

## 2011-09-28 DIAGNOSIS — B372 Candidiasis of skin and nail: Secondary | ICD-10-CM

## 2011-09-28 MED ORDER — DOXYCYCLINE HYCLATE 100 MG PO TABS: 100.0000 mg | ORAL_TABLET | Freq: Two times a day (BID) | ORAL | Status: AC

## 2011-09-28 MED ORDER — NYSTATIN 100000 UNIT/GM EX CREA: TOPICAL_CREAM | Freq: Two times a day (BID) | CUTANEOUS | Status: DC

## 2011-09-28 NOTE — Patient Instructions (Addendum)
Take the antibiotics Try the Afrin - no more than 3 days and continue Nasal saline  Use Nystatin cream twice a day   Sinusitis Sinuses are air pockets within the bones of your face. The growth of bacteria within a sinus leads to infection. The infection prevents the sinuses from draining. This infection is called sinusitis. SYMPTOMS   There will be different areas of pain depending on which sinuses have become infected.  The maxillary sinuses often produce pain beneath the eyes.   Frontal sinusitis may cause pain in the middle of the forehead and above the eyes.  Other problems (symptoms) include:  Toothaches.   Colored, pus-like (purulent) drainage from the nose.   Swelling, warmth, and tenderness over the sinus areas may be signs of infection.  TREATMENT   Sinusitis is most often determined by an exam.X-rays may be taken. If x-rays have been taken, make sure you obtain your results or find out how you are to obtain them. Your caregiver may give you medications (antibiotics). These are medications that will help kill the bacteria causing the infection. You may also be given a medication (decongestant) that helps to reduce sinus swelling.   HOME CARE INSTRUCTIONS    Only take over-the-counter or prescription medicines for pain, discomfort, or fever as directed by your caregiver.   Drink extra fluids. Fluids help thin the mucus so your sinuses can drain more easily.   Applying either moist heat or ice packs to the sinus areas may help relieve discomfort.   Use saline nasal sprays to help moisten your sinuses. The sprays can be found at your local drugstore.  SEEK IMMEDIATE MEDICAL CARE IF:  You have a fever.   You have increasing pain, severe headaches, or toothache.   You have nausea, vomiting, or drowsiness.   You develop unusual swelling around the face or trouble seeing.  MAKE SURE YOU:    Understand these instructions.   Will watch your condition.   Will get help  right away if you are not doing well or get worse.  Document Released: 03/05/2005 Document Revised: 02/22/2011 Document Reviewed: 10/02/2006 [facility] Patient Information 2012 [place], [place].

## 2011-09-30 NOTE — Progress Notes (Signed)
  Subjective:    Patient ID: [name] Fitzpatrick, female    DOB: 1955-10-14, 56 y.o.   MRN: [id number]  HPI Pt presents with facial pain and sinus pressure for 1 week. Diagnosed with OM 4 weeks ago placed on antibiotics, she did not improve therefore sent to ENT and was started on Cirpodex drops. She has thick draiange mostly from right side, feels like she has been hit in the face, discomfort when eating in the face. Has mild cough productive of mucous in AM. Also has rash on creases of groin, she has been using cornstarch states it "smells like yeast to me"   Review of Systems  GEN- denies fatigue, fever, weight loss,weakness, recent illness HEENT- denies eye drainage, change in vision, +nasal discharge, CVS- denies chest pain, palpitations RESP- denies SOB, cough, wheeze ABD- denies N/V, change in stools, abd pain GU- denies dysuria, hematuria, dribbling, incontinence, vaginal discharge MSK- + joint pain, muscle aches, injury Neuro- + headache, dizziness, syncope, seizure activity        Objective:   Physical Exam GEN- NAD, alert and oriented x3 HEENT- PERRL, EOMI, non injected sclera, pink conjunctiva, MMM, oropharynx clear, nares, thick mucous, + sinus pressure/tenderness maxillar and frontal,no dental abscess, TM mild erythema of canal RIght side, Bilateral TM clear no effusion Neck- Supple, no LAD CVS- RRR, no murmur RESP-CTAB EXT- No edema Skin- erythema along groin creases  Extended to labia majora, cornstarch seen, no raised lesions Pulses- Radial, DP- 2+        Assessment & Plan:     Maxillary sinusitis- will treat based on symptoms and time course. Antibiotics- doxycyxline with PCN allergy. Allergy to steroids therefore will try Afrin as decongestant for 2-3 days then nasal saline   Intertrigo candidiasis- nystatin cream BID

## 2011-10-10 ENCOUNTER — Encounter: Payer: Self-pay | Admitting: Family Medicine

## 2011-10-18 NOTE — Telephone Encounter (Signed)
Patient is aware 

## 2011-11-28 ENCOUNTER — Ambulatory Visit: Payer: Self-pay | Admitting: Family Medicine

## 2011-12-04 ENCOUNTER — Ambulatory Visit: Payer: Self-pay | Admitting: Family Medicine

## 2011-12-10 ENCOUNTER — Ambulatory Visit (INDEPENDENT_AMBULATORY_CARE_PROVIDER_SITE_OTHER): Payer: Medicare Other | Admitting: Family Medicine

## 2011-12-10 ENCOUNTER — Encounter: Payer: Self-pay | Admitting: Family Medicine

## 2011-12-10 ENCOUNTER — Ambulatory Visit (HOSPITAL_COMMUNITY)
Admission: RE | Admit: 2011-12-10 | Discharge: 2011-12-10 | Disposition: A | Discharge status: Home / Self Care | Payer: Medicare Other | Source: Ambulatory Visit | Attending: Family Medicine | Admitting: Family Medicine

## 2011-12-10 VITALS — BP 110/74 | HR 75 | Resp 16 | Ht 62.5 in | Wt 207.0 lb

## 2011-12-10 DIAGNOSIS — R05 Cough: Secondary | ICD-10-CM | POA: Insufficient documentation

## 2011-12-10 DIAGNOSIS — R911 Solitary pulmonary nodule: Secondary | ICD-10-CM

## 2011-12-10 DIAGNOSIS — J40 Bronchitis, not specified as acute or chronic: Secondary | ICD-10-CM

## 2011-12-10 DIAGNOSIS — F332 Major depressive disorder, recurrent severe without psychotic features: Secondary | ICD-10-CM

## 2011-12-10 DIAGNOSIS — R7301 Impaired fasting glucose: Secondary | ICD-10-CM

## 2011-12-10 DIAGNOSIS — F17200 Nicotine dependence, unspecified, uncomplicated: Secondary | ICD-10-CM

## 2011-12-10 DIAGNOSIS — J449 Chronic obstructive pulmonary disease, unspecified: Secondary | ICD-10-CM

## 2011-12-10 DIAGNOSIS — E039 Hypothyroidism, unspecified: Secondary | ICD-10-CM

## 2011-12-10 DIAGNOSIS — Z139 Encounter for screening, unspecified: Secondary | ICD-10-CM

## 2011-12-10 DIAGNOSIS — Z1211 Encounter for screening for malignant neoplasm of colon: Secondary | ICD-10-CM

## 2011-12-10 DIAGNOSIS — M2550 Pain in unspecified joint: Secondary | ICD-10-CM

## 2011-12-10 DIAGNOSIS — R059 Cough, unspecified: Secondary | ICD-10-CM | POA: Insufficient documentation

## 2011-12-10 DIAGNOSIS — R5381 Other malaise: Secondary | ICD-10-CM

## 2011-12-10 DIAGNOSIS — R5383 Other fatigue: Secondary | ICD-10-CM

## 2011-12-10 MED ORDER — PROMETHAZINE-DM 6.25-15 MG/5ML PO SYRP: ORAL_SOLUTION | ORAL | Status: DC

## 2011-12-10 MED ORDER — SULFAMETHOXAZOLE-TRIMETHOPRIM 800-160 MG PO TABS: 1.0000 | ORAL_TABLET | Freq: Two times a day (BID) | ORAL | Status: DC

## 2011-12-10 MED ORDER — CLINDAMYCIN HCL 300 MG PO CAPS: 300.0000 mg | ORAL_CAPSULE | Freq: Three times a day (TID) | ORAL | Status: DC

## 2011-12-10 MED ORDER — DICYCLOMINE HCL 10 MG PO CAPS: 10.0000 mg | ORAL_CAPSULE | Freq: Two times a day (BID) | ORAL | Status: DC

## 2011-12-10 NOTE — Progress Notes (Signed)
  Subjective:    Patient ID: [name] Fitzpatrick, female    DOB: [birth year], 56 y.o.   MRN: [id number]  HPI 4 week h/o head and chest congestion, green and dark golden  sptum and has also seen  bright red blood in sputum x 1week Fever and chlls x 1 month was 102 approx 2 weeks ago. Fatigue is worsening. Concerned and wantas to be checked for lupus   Review of Systems See HPI  Denies sinus pressure, nasal congestion, ear pain or sore throat. . Denies chest pains, palpitations and leg swelling Denies abdominal pain, nausea, vomiting,diarrhea or constipation.   Denies dysuria, frequency, hesitancy or incontinence. Chronic  joint pain, and  limitation in mobility. Denies headaches, seizures, numbness, or tingling. Denies uncontrolled  depression, anxiety or insomnia. Denies skin break down or rash.        Objective:   Physical Exam Patient alert and oriented and in mild cardiopulmonary distress.  HEENT: No facial asymmetry, EOMI, no sinus tenderness,  oropharynx pink and moist.  Neck decreased ROM  no adenopathy.  Chest: decreased air entry scattered crackles, few wheezes CVS: S1, S2 no murmurs, no S3.  ABD: Soft non tender. Bowel sounds normal.  Ext: No edema  MS: Decreased  ROM spine, shoulders, hips and knees.  Skin: Intact, no ulcerations or rash noted.  Psych: Good eye contact, normal affect. Memory intact not anxious but depressed appearing.  CNS: CN 2-12 intact, power, tone and sensation normal throughout.        Assessment & Plan:

## 2011-12-10 NOTE — Patient Instructions (Addendum)
F/u early December.  Cbc,Fasting lipid, cmp , TSh, HBA1C , Vit D, anti double stranded DNA and anti smith antibodies for testing for SLE per yopur request  You are referred to lung specialist in [place] due to nodules  You are referred to Dr Karilyn Cota for colonoscopy you need this  Two antibiotics, and cough suppresant med prescribed  CXR today  Due to bloody sputum you need tb skin test

## 2011-12-11 ENCOUNTER — Encounter (INDEPENDENT_AMBULATORY_CARE_PROVIDER_SITE_OTHER): Payer: Self-pay | Admitting: *Deleted

## 2011-12-11 LAB — COMPREHENSIVE METABOLIC PANEL
ALT: 9 U/L (ref 0–35)
BUN: 10 mg/dL (ref 6–23)
CO2: 29 mEq/L (ref 19–32)
Calcium: 9.4 mg/dL (ref 8.4–10.5)
Chloride: 102 mEq/L (ref 96–112)
Creat: 0.79 mg/dL (ref 0.50–1.10)
Glucose, Bld: 89 mg/dL (ref 70–99)

## 2011-12-11 LAB — TSH: TSH: 4.318 u[IU]/mL (ref 0.350–4.500)

## 2011-12-11 LAB — LIPID PANEL
Cholesterol: 231 mg/dL — ABNORMAL HIGH (ref 0–200)
Total CHOL/HDL Ratio: 5 Ratio
Triglycerides: 221 mg/dL — ABNORMAL HIGH (ref <150)

## 2011-12-11 LAB — VITAMIN D 25 HYDROXY (VIT D DEFICIENCY, FRACTURES): Vit D, 25-Hydroxy: 23 ng/mL — ABNORMAL LOW (ref 30–89)

## 2011-12-12 ENCOUNTER — Telehealth: Payer: Self-pay | Admitting: Family Medicine

## 2011-12-12 MED ORDER — PRAVASTATIN SODIUM 40 MG PO TABS: 40.0000 mg | ORAL_TABLET | Freq: Every evening | ORAL | Status: DC

## 2011-12-12 NOTE — Telephone Encounter (Signed)
Called pt back and she is aware 

## 2011-12-13 ENCOUNTER — Encounter: Payer: Self-pay | Admitting: *Deleted

## 2011-12-13 NOTE — Progress Notes (Signed)
Received referral.  Spoke with Dr. Delton Coombes regarding appt. Called pt left message to call about appt

## 2011-12-18 ENCOUNTER — Telehealth: Payer: Self-pay | Admitting: Family Medicine

## 2011-12-18 ENCOUNTER — Telehealth: Payer: Self-pay | Admitting: *Deleted

## 2011-12-18 NOTE — Telephone Encounter (Signed)
Left message for pt to call.

## 2011-12-19 LAB — ANTI-DNA ANTIBODY, DOUBLE-STRANDED: ds DNA Ab: 1 IU/mL (ref <30)

## 2011-12-19 NOTE — Telephone Encounter (Signed)
Pt aware that I called the lab and they said the results were going to be delayed but they would be in within the week.

## 2011-12-23 DIAGNOSIS — F17200 Nicotine dependence, unspecified, uncomplicated: Secondary | ICD-10-CM | POA: Insufficient documentation

## 2011-12-23 NOTE — Assessment & Plan Note (Signed)
Improved, requied inpatient care in 06/2011. Treated by psychiatry regulalrly

## 2011-12-23 NOTE — Assessment & Plan Note (Signed)
Continues to deteriorate due to ongoing nicotine use 

## 2011-12-23 NOTE — Assessment & Plan Note (Signed)
Controlled, no change in medication  

## 2011-12-23 NOTE — Assessment & Plan Note (Signed)
Pt with COPD and 4 week h/o productive cough, double antibiotic coverage prescribed, PPD placement also recommended however pt stated she would be unable to return for reading , she is to follow up with pulmonary specialist re lung nodules

## 2011-12-23 NOTE — Assessment & Plan Note (Signed)

## 2011-12-25 ENCOUNTER — Ambulatory Visit (HOSPITAL_COMMUNITY)
Admission: RE | Admit: 2011-12-25 | Discharge: 2011-12-25 | Disposition: A | Discharge status: Home / Self Care | Payer: Medicare Other | Source: Ambulatory Visit | Attending: Anesthesiology | Admitting: Anesthesiology

## 2011-12-25 ENCOUNTER — Other Ambulatory Visit (HOSPITAL_COMMUNITY): Payer: Self-pay | Admitting: Anesthesiology

## 2011-12-25 DIAGNOSIS — M79609 Pain in unspecified limb: Secondary | ICD-10-CM | POA: Insufficient documentation

## 2011-12-25 DIAGNOSIS — M79671 Pain in right foot: Secondary | ICD-10-CM

## 2011-12-25 DIAGNOSIS — M899 Disorder of bone, unspecified: Secondary | ICD-10-CM | POA: Insufficient documentation

## 2011-12-25 DIAGNOSIS — Z981 Arthrodesis status: Secondary | ICD-10-CM | POA: Insufficient documentation

## 2012-01-10 ENCOUNTER — Institutional Professional Consult (permissible substitution): Payer: Self-pay | Admitting: Internal Medicine

## 2012-01-21 ENCOUNTER — Institutional Professional Consult (permissible substitution): Payer: Self-pay | Admitting: Internal Medicine

## 2012-02-11 ENCOUNTER — Other Ambulatory Visit: Payer: Self-pay | Admitting: Family Medicine

## 2012-03-03 ENCOUNTER — Ambulatory Visit: Payer: Medicare Other | Admitting: Family Medicine

## 2012-03-04 ENCOUNTER — Telehealth: Payer: Self-pay | Admitting: Family Medicine

## 2012-03-13 NOTE — Telephone Encounter (Signed)
Error/disregard

## 2012-03-20 ENCOUNTER — Ambulatory Visit (INDEPENDENT_AMBULATORY_CARE_PROVIDER_SITE_OTHER): Payer: Medicare Other | Admitting: Family Medicine

## 2012-03-20 ENCOUNTER — Encounter: Payer: Self-pay | Admitting: Family Medicine

## 2012-03-20 VITALS — BP 118/70 | HR 71 | Resp 16 | Ht 62.0 in | Wt 209.0 lb

## 2012-03-20 DIAGNOSIS — R110 Nausea: Secondary | ICD-10-CM

## 2012-03-20 DIAGNOSIS — Z13220 Encounter for screening for lipoid disorders: Secondary | ICD-10-CM

## 2012-03-20 DIAGNOSIS — F411 Generalized anxiety disorder: Secondary | ICD-10-CM

## 2012-03-20 DIAGNOSIS — M545 Low back pain, unspecified: Secondary | ICD-10-CM

## 2012-03-20 DIAGNOSIS — R609 Edema, unspecified: Secondary | ICD-10-CM

## 2012-03-20 DIAGNOSIS — E039 Hypothyroidism, unspecified: Secondary | ICD-10-CM

## 2012-03-20 DIAGNOSIS — R7301 Impaired fasting glucose: Secondary | ICD-10-CM

## 2012-03-20 DIAGNOSIS — R911 Solitary pulmonary nodule: Secondary | ICD-10-CM

## 2012-03-20 DIAGNOSIS — R7309 Other abnormal glucose: Secondary | ICD-10-CM

## 2012-03-20 DIAGNOSIS — F329 Major depressive disorder, single episode, unspecified: Secondary | ICD-10-CM

## 2012-03-20 DIAGNOSIS — E669 Obesity, unspecified: Secondary | ICD-10-CM

## 2012-03-20 DIAGNOSIS — R7302 Impaired glucose tolerance (oral): Secondary | ICD-10-CM

## 2012-03-20 MED ORDER — FUROSEMIDE 20 MG PO TABS: 20.0000 mg | ORAL_TABLET | Freq: Two times a day (BID) | ORAL | Status: DC

## 2012-03-20 MED ORDER — POTASSIUM CHLORIDE CRYS ER 20 MEQ PO TBCR: 20.0000 meq | EXTENDED_RELEASE_TABLET | Freq: Two times a day (BID) | ORAL | Status: DC

## 2012-03-20 MED ORDER — ONDANSETRON HCL 4 MG PO TABS: 4.0000 mg | ORAL_TABLET | Freq: Every day | ORAL | Status: DC | PRN

## 2012-03-20 NOTE — Patient Instructions (Addendum)
Annual wellness in 4 month, call if you need me before  You need the colonoscopy,, please re contact Dr Patty Sermons office. Also you have been referred to him to investigate daily nausea.  Fasting llipid, cmp, TSH,  and HBa1C end January or early Feb we will call with result  H pylori today  New med for leg swelling with potassium, also zofran for nausea    Please get the flu vaccine, you need this

## 2012-03-20 NOTE — Progress Notes (Signed)
  Subjective:    Patient ID: [name] Fitzpatrick, female    DOB: November 13, 1955, 57 y.o.   MRN: [id number]  HPI 2 to 4 month h/o increased generalized edema, uses 2 pillows, wakes up intermittently due to shortness of breath. States she has been made aware that she has heart failure, no recent cardiology eval, feels this is the cause. Was being followed by [place] card up to 2 years ago , states tests were noon revealing, and the concern was maybe the kidneys were a problem C/o increased and uncontrolled daily nausea, has had cholecystectomy, notes no change in bowel movements, but colonoscopy still outstanding Denies symptoms of uncontrolled depression or anxiety. Denies recent or current fever, chills or symptoms suggestive of infection. Requests flu vaccine, unfortunately none available in the clinic. Quit smoking x 2 monht   Review of Systems See HPI Denies recent fever or chills. Denies sinus pressure, nasal congestion, ear pain or sore throat. Denies chest congestion, productive cough or wheezing. Denies chest pains and  palpitations  Denies abdominal pain, nausea, vomiting,diarrhea or constipation.   Denies dysuria, frequency, hesitancy or incontinence. Chronic  joint pain, swelling and limitation in mobility.  Denies uncontrolled  depression, anxiety or insomnia. Denies skin break down or rash.        Objective:   Physical Exam Patient alert and oriented and in no cardiopulmonary distress.  HEENT: No facial asymmetry, EOMI, no sinus tenderness,  oropharynx pink and moist.  Neck supple no adenopathy.  Chest: Clear to auscultation bilaterally.Decreased though adequate air entry throughout  CVS: S1, S2 no murmurs, no S3.  ABD: Soft non tender. Bowel sounds normal.  ZHY:QMVHQ edema  MS: Adequate though reduced  ROM spine, shoulders, hips and knees.  Skin: Intact, no ulcerations or rash noted.  Psych: Good eye contact, normal affect. Memory intact not anxious  mildly  depressed appearing.  CNS: CN 2-12 intact, power,  normal throughout.        Assessment & Plan:

## 2012-03-21 ENCOUNTER — Telehealth: Payer: Self-pay | Admitting: Family Medicine

## 2012-03-21 LAB — H. PYLORI ANTIBODY, IGG: H Pylori IgG: <0.4 {ISR}

## 2012-03-21 MED ORDER — PROMETHAZINE HCL 12.5 MG PO TABS: ORAL_TABLET | ORAL | Status: DC

## 2012-03-21 NOTE — Telephone Encounter (Signed)
New script printed, pls notify pt and pharmacy and send

## 2012-03-21 NOTE — Telephone Encounter (Signed)
Med sent.

## 2012-03-23 ENCOUNTER — Telehealth: Payer: Self-pay | Admitting: Family Medicine

## 2012-03-23 ENCOUNTER — Encounter: Payer: Self-pay | Admitting: Family Medicine

## 2012-03-23 DIAGNOSIS — R7302 Impaired glucose tolerance (oral): Secondary | ICD-10-CM | POA: Insufficient documentation

## 2012-03-23 DIAGNOSIS — E669 Obesity, unspecified: Secondary | ICD-10-CM | POA: Insufficient documentation

## 2012-03-23 NOTE — Assessment & Plan Note (Signed)
Unchanged. Patient re-educated about  the importance of commitment to a  minimum of 150 minutes of exercise per week. The importance of healthy food choices with portion control discussed. Encouraged to start a food diary, count calories and to consider  joining a support group. Sample diet sheets offered. Goals set by the patient for the next several months.    

## 2012-03-23 NOTE — Assessment & Plan Note (Addendum)
Chronic daily nausea, uncontrolled, requesting med for this, will check H pylori status and refer to GI Trial of a PPI also

## 2012-03-23 NOTE — Assessment & Plan Note (Signed)
Controlled, no change in medication  

## 2012-03-23 NOTE — Assessment & Plan Note (Signed)
Chronic and unchanged, ambulates with a cane, pain meds as before

## 2012-03-23 NOTE — Assessment & Plan Note (Signed)
H/o insulin dependence, now normoglycemic , will follow HBa1C on an intermittent basis

## 2012-03-23 NOTE — Assessment & Plan Note (Signed)
C/o increased fluid retention, gives CHF history, unwilling to see cardiology ofr re evaluation at this time due to cost, will retrieve last notes and review, symptoms and signs are negative for acute decompensation

## 2012-03-23 NOTE — Telephone Encounter (Signed)
Please let pt know that I would like her to try protonix, to help with the nausea, as reflux may also be a cause of her chronic nausea, I have entered hoistorically, pls fax if she agrees

## 2012-03-27 NOTE — Telephone Encounter (Signed)
Has upcoming appt with GI , advise her to hold on that recommendation, I have no other suggestions

## 2012-03-27 NOTE — Telephone Encounter (Signed)
Patient states that she is currently taking zantac as well as omeprazole and neither of those are working.  She states that she has tried protonix in the the past.  Please advise.

## 2012-03-27 NOTE — Telephone Encounter (Signed)
Called and left message for patient to return call.  

## 2012-04-03 NOTE — Telephone Encounter (Signed)
Noted  

## 2012-04-16 ENCOUNTER — Telehealth: Payer: Self-pay | Admitting: Family Medicine

## 2012-04-18 MED ORDER — BENZONATATE 100 MG PO CAPS: 100.0000 mg | ORAL_CAPSULE | Freq: Three times a day (TID) | ORAL | Status: DC | PRN

## 2012-04-18 NOTE — Telephone Encounter (Signed)
Pt aware. Med sent 

## 2012-04-18 NOTE — Telephone Encounter (Signed)
OTC meds of her choice if no better needs to be seen in office, antibiotics wont be prescribed over the phone, eg robitussin  If she wants tessalon perles as a decongestant pls send in tessalon perles 100mg  one 3 times daily as needed, for chest congestion

## 2012-04-18 NOTE — Telephone Encounter (Signed)
Patient states she has chest congestion and hoarsness, producing some greenish phlegm. Luann and myself offered her an appt  to come in but she said she did not feel like coming out in the cold. Please advise

## 2012-04-28 ENCOUNTER — Telehealth (INDEPENDENT_AMBULATORY_CARE_PROVIDER_SITE_OTHER): Payer: Self-pay | Admitting: *Deleted

## 2012-04-28 ENCOUNTER — Other Ambulatory Visit (INDEPENDENT_AMBULATORY_CARE_PROVIDER_SITE_OTHER): Payer: Self-pay | Admitting: *Deleted

## 2012-04-28 ENCOUNTER — Encounter (INDEPENDENT_AMBULATORY_CARE_PROVIDER_SITE_OTHER): Payer: Self-pay | Admitting: *Deleted

## 2012-04-28 ENCOUNTER — Encounter (INDEPENDENT_AMBULATORY_CARE_PROVIDER_SITE_OTHER): Payer: Self-pay | Admitting: Internal Medicine

## 2012-04-28 ENCOUNTER — Ambulatory Visit (INDEPENDENT_AMBULATORY_CARE_PROVIDER_SITE_OTHER): Payer: Medicare Other | Admitting: Internal Medicine

## 2012-04-28 VITALS — BP 98/60 | HR 80 | Temp 99.1°F | Ht 62.0 in | Wt 211.1 lb

## 2012-04-28 DIAGNOSIS — Z1211 Encounter for screening for malignant neoplasm of colon: Secondary | ICD-10-CM

## 2012-04-28 DIAGNOSIS — K219 Gastro-esophageal reflux disease without esophagitis: Secondary | ICD-10-CM

## 2012-04-28 DIAGNOSIS — R1310 Dysphagia, unspecified: Secondary | ICD-10-CM

## 2012-04-28 DIAGNOSIS — R1314 Dysphagia, pharyngoesophageal phase: Secondary | ICD-10-CM

## 2012-04-28 MED ORDER — PEG-KCL-NACL-NASULF-NA ASC-C 100 G PO SOLR: 1.0000 | Freq: Once | ORAL | Status: DC

## 2012-04-28 NOTE — Patient Instructions (Addendum)
EGD/ED, colonoscopy 

## 2012-04-28 NOTE — Progress Notes (Signed)
Subjective:     Patient ID: [name] Fitzpatrick, female   DOB: 1955-05-16, 57 y.o.   MRN: [id number]  HPI Referred to our office for stomach problems. She tells me she has bloating.  She has frequent nausea. Nausea worse in the am. No foods in particular bother her. No acid reflux. Her appetite is not good.  ? Weight loss of 4 pounds. She tells me occasionally she has dysphagia. Occurs about once a week. She will either vomit it back up or keep drinking water to force the bolus down.   She has to take the Dicyclomine for abdominal pain.  She has frequent constipation and takes Miralax for this. Her stools are hard.  Stools are large and sometimes she stops the comode up. She usually has a BM about three times a week.  Sometimes her stools are like pebbles.  H. Pylori negative. She occasionally uses Advil gel caps about three times a month.  Grandfather had colon cancer ? Age.   Review of Systems  See hpi Current Outpatient Prescriptions  Medication Sig Dispense Refill  . albuterol-ipratropium (COMBIVENT) 18-103 MCG/ACT inhaler Inhale 2 puffs into the lungs 3 (three) times daily. For shortness of breath.  Take two puffs by mouth three times a day      . aspirin (ASPIRIN LOW DOSE) 81 MG EC tablet Take 1 tablet (81 mg total) by mouth at bedtime. For mild pain / blood thinner   Take one tablet by mouth daily  30 tablet  0  . benzonatate (TESSALON PERLES) 100 MG capsule Take 1 capsule (100 mg total) by mouth 3 (three) times daily as needed for cough.  30 capsule  0  . diazepam (VALIUM) 5 MG tablet Take 5 mg by mouth 2 (two) times daily as needed.      . dicyclomine (BENTYL) 10 MG capsule Take 10 mg by mouth 2 (two) times daily.      . furosemide (LASIX) 20 MG tablet Take 1 tablet (20 mg total) by mouth 2 (two) times daily.  30 tablet  4  . HYDROmorphone (DILAUDID) 4 MG tablet Take 1 tablet (4 mg total) by mouth every 4 (four) hours as needed. For severe pain.  16 tablet  0  . levothyroxine  (SYNTHROID, LEVOTHROID) 200 MCG tablet TAKE 1 TABLET ONCE DAILY.  30 tablet  3  . morphine (AVINZA) 60 MG 24 hr capsule Take 60 mg by mouth daily.      Marland Kitchen morphine (KADIAN) 30 MG 24 hr capsule Take 30 mg by mouth daily.      . polyethylene glycol (MIRALAX / GLYCOLAX) packet Take 25 g by mouth daily as needed.  For constipation   Dissolved in water or juice as needed for constipation  14 each    . potassium chloride SA (K-DUR,KLOR-CON) 20 MEQ tablet Take 1 tablet (20 mEq total) by mouth 2 (two) times daily.  60 tablet  4  . promethazine (PHENERGAN) 12.5 MG tablet One tablet once daily, as needed, for nausea. Discontinue zofran please  30 tablet  1  . sertraline (ZOLOFT) 100 MG tablet Take 100 mg by mouth daily.      . traZODone (DESYREL) 150 MG tablet Take 1 tablet (150 mg total) by mouth at bedtime as needed. For sleep      . dicyclomine (BENTYL) 10 MG capsule Take 1 capsule (10 mg total) by mouth 2 (two) times daily.  60 capsule  3   No current facility-administered medications for this visit.  Past Medical History  Diagnosis Date  . Anxiety   . Depression   . Hypothyroidism   . Osteoarthritis   . IBS (irritable bowel syndrome)     with constipation  . Fibromyalgia   . Chronic LBP   . Migraine headache   . Chronic neck pain   . DM type 2 (diabetes mellitus, type 2)   . Hypertension   . COPD (chronic obstructive pulmonary disease)   . H/O suicide attempt    Past Surgical History  Procedure Laterality Date  . Reconstucted right ankle    . C-spine fusion  1991, 2004  . Vesicovaginal fistula closure w/ tah  1993    Fibroids no Ca  . Right knee arthroscopic surgery    . B/l foot surgery --bunions    . L carpal tunnel release    . L spine fusion x 2    . Total reconstruction of right ankle and heel      total of three   . Abdominal hysterectomy    . Cholecystectomy     Allergies  Allergen Reactions  . Prednisone Shortness Of Breath and Nausea And Vomiting  . Penicillins  Other (See Comments)    Pt states she almost died after taking the following. She states that she had Stroke like symptoms post taking the medication  . Cephalexin Hives, Swelling and Rash  . Latex Rash and Other (See Comments)    Causes skin to tear easily  . Metoclopramide Hcl Hives and Rash         Objective:   Physical Exam  Filed Vitals:   04/28/12 1500  BP: 98/60  Pulse: 80  Temp: 99.1 F (37.3 C)  Height: 5\' 2"  (1.575 m)  Weight: 211 lb 1.6 oz (95.754 kg)   Alert and oriented. Skin warm and dry. Oral mucosa is moist.   . Sclera anicteric, conjunctivae is pink. Thyroid not enlarged. No cervical lymphadenopathy. Lungs clear. Heart regular rate and rhythm.  Abdomen is soft. Bowel sounds are positive. No hepatomegaly. No abdominal masses felt.Epigastric tenderness.  No edema to lower extremities.       Assessment:   Bloating, nausea: PUD needs to be ruled out.  Dyspagia to solids and pills. Esopageal stricture needs to be ruled out.    Screening colonoscopy. Plan:    EGD/EDColonoscopy  The risks and benefits such as perforation, bleeding, and infection were reviewed with the patient and is agreeable.

## 2012-04-28 NOTE — Patient Instructions (Addendum)
[name] Fitzpatrick  04/29/2012   Your procedure is scheduled on:  05/02/12  Report to [facility] at [place] [facility].  Call this number if you have problems the morning of surgery: [phone]   Remember:   Do not eat food or drink liquids after midnight.   Take these medicines the morning of surgery with A SIP OF WATER: combivent, synthroid, valium, bentyl, zoloft & pain pill if needed   Do not wear jewelry, make-up or nail polish.  Do not wear lotions, powders, or perfumes. You may wear deodorant.  Do not shave 48 hours prior to surgery. Men may shave face and neck.  Do not bring valuables to the hospital.  Contacts, dentures or bridgework may not be worn into surgery.  Leave suitcase in the car. After surgery it may be brought to your room.  For patients admitted to the hospital, checkout time is 11:00 AM the day of  discharge.   Patients discharged the day of surgery will not be allowed to drive  home.  Name and phone number of your driver: family  Special Instructions: N/A   Please read over the following fact sheets that you were given: Anesthesia Post-op Instructions and Care and Recovery After Surgery   PATIENT INSTRUCTIONS POST-ANESTHESIA  IMMEDIATELY FOLLOWING SURGERY:  Do not drive or operate machinery for the first twenty four hours after surgery.  Do not make any important decisions for twenty four hours after surgery or while taking narcotic pain medications or sedatives.  If you develop intractable nausea and vomiting or a severe headache please notify your doctor immediately.  FOLLOW-UP:  Please make an appointment with your surgeon as instructed. You do not need to follow up with anesthesia unless specifically instructed to do so.  WOUND CARE INSTRUCTIONS (if applicable):  Keep a dry clean dressing on the anesthesia/puncture wound site if there is drainage.  Once the wound has quit draining you may leave it open to air.  Generally you should leave the bandage intact for twenty  four hours unless there is drainage.  If the epidural site drains for more than 36-48 hours please call the anesthesia department.  QUESTIONS?:  Please feel free to call your physician or the hospital operator if you have any questions, and they will be happy to assist you.      Esophageal Dilatation The esophagus is the long, narrow tube which carries food and liquid from the mouth to the stomach. Esophageal dilatation is the technique used to stretch a blocked or narrowed portion of the esophagus. This procedure is used when a part of the esophagus has become so narrow that it becomes difficult, painful or even impossible to swallow. This is generally an uncomplicated form of treatment. When this is not successful, chest surgery may be required. This is a much more extensive form of treatment with a longer recovery time. CAUSES  Some of the more common causes of blockage or strictures of the esophagus are:  Narrowing from longstanding inflammation (soreness and redness) of the lower esophagus. This comes from the constant exposure of the lower esophagus to the acid which bubbles up from the stomach. Over time this causes scarring and narrowing of the lower esophagus.  Hiatal hernia in which a small part of the stomach bulges (herniates) up through the diaphragm. This can cause a gradual narrowing of the end of the esophagus.  Schatzki's Ring is a narrow ring of benign (non-cancerous) fibrous tissue which constricts the lower esophagus. The reason for  this is not known.  Scleroderma is a connective tissue disorder that affects the esophagus and makes swallowing difficult.  Achalasia is an absence of nerves to the lower esophagus and to the esophageal sphincter. This is the circular muscle between the stomach and esophagus that relaxes to allow food into the stomach. After swallowing, it contracts to keep food in the stomach. This absence of nerves may be congenital (present since birth). This can  cause irregular spasms of the lower esophageal muscle. This spasm does not open up to allow food and fluid through. The result is a persistent blockage with subsequent slow trickling of the esophageal contents into the stomach.  Strictures may develop from swallowing materials which damage the esophagus. Some examples are strong acids or alkalis such as lye.  Growths such as benign (non-cancerous) and malignant (cancerous) tumors can block the esophagus.  Heredity (present since birth) causes. DIAGNOSIS  Your caregiver often suspects this problem by taking a medical history. They will also do a physical exam. They can then prove their suspicions using X-rays and endoscopy. Endoscopy is an exam in which a tube like a small flexible telescope is used to look at your esophagus.  TREATMENT There are different stretching (dilating) techniques which can be used. Simple bougie dilatation may be done in the office. This usually takes only a couple minutes. A numbing (anesthetic) spray of the throat is used. Endoscopy, when done, is done in an endoscopy suite, under mild sedation. When fluoroscopy is used, the procedure is performed in X-ray. Other techniques require a little longer time. Recovery is usually quick. There is no waiting time to begin eating and drinking to test success of the treatment. Following are some of the methods used. Narrowing of the esophagus is treated by making it bigger. Commonly this is a mechanical problem which can be treated with stretching. This can be done in different ways. Your caregiver will discuss these with you. Some of the means used are:  A series of graduated (increasing thickness) flexible dilators can be used. These are weighted tubes passed through the esophagus into the stomach. The tubes used become progressively larger until the desired stretched size is reached. Graduated dilators are a simple and quick way of opening the esophagus. No visualization is  required.  Another method is the use of endoscopy to place a flexible wire across the stricture. The endoscope is removed and the wire left in place. A dilator with a hole through it from end to end is guided down the esophagus and across the stricture. One or more of these dilators are passed over the wire. At the end of the exam, the wire is removed. This type of treatment may be performed in the X-ray department under fluoroscopy. An advantage of this procedure is the examiner is visualizing the end opening in the esophagus.  Stretching of the esophagus may be done using balloons. Deflated balloons are placed through the endoscope and across the stricture. This type of balloon dilatation is often done at the time of endoscopy or fluoroscopy. Flexible endoscopy allows the examiner to directly view the stricture. A balloon is inserted in the deflated form into the area of narrowing. It is then inflated with air to a certain pressure that is pre-set for a given circumference. When inflated, it becomes sausage shaped, stretched, and makes the stricture larger.  Achalasia requires a longer larger balloon-type dilator. This is frequently done under X-ray control. In this situation, the spastic muscle fibers in the  lower esophagus are stretched. All of the above procedures make the passage of food and water into the stomach easier. They also make it easier for stomach contents to reflux back into the esophagus. Special medications may be used following the procedure to help prevent further stricturing. Proton-pump inhibitor medications are good at decreasing the amount of acid in the stomach juice. When stomach juice refluxes into the esophagus, the juice is no longer as acidic and is less likely to burn or scar the esophagus. RISKS AND COMPLICATIONS Esophageal dilatation is usually performed effectively and without problems. Some complications that can occur are:  A small amount of bleeding almost always  happens where the stretching takes place. If this is too excessive it may require more aggressive treatment.  An uncommon complication is perforation (making a hole) of the esophagus. The esophagus is thin. It is easy to make a hole in it. If this happens, an operation may be necessary to repair this.  A small, undetected perforation could lead to an infection in the chest. This can be very serious. HOME CARE INSTRUCTIONS   If you received sedation for your procedure, do not drive, make important decisions, or perform any activities requiring your full coordination. Do not drink alcohol, take sedatives, or use any mind altering chemicals unless instructed by your caregiver.  You may use throat lozenges or warm salt water gargles if you have throat discomfort  You can begin eating and drinking normally on return home unless instructed otherwise. Do not purposely try to force large chunks of food down to test the benefits of your procedure.  Mild discomfort can be eased with sips of ice water.  Medications for discomfort may or may not be needed. SEEK IMMEDIATE MEDICAL CARE IF:   You begin vomiting up blood.  You develop black tarry stools  You develop chills or an unexplained temperature of over 101 F (38.3 C)  You develop chest or abdominal pain.  You develop shortness of breath or feel lightheaded or faint.  Your swallowing is becoming more painful, difficult, or you are unable to swallow. MAKE SURE YOU:   Understand these instructions.  Will watch your condition.  Will get help right away if you are not doing well or get worse. Document Released: 04/26/2005 Document Revised: 05/28/2011 Document Reviewed: 06/13/2005 [facility] Patient Information 2013 [place], [place]. Esophagogastroduodenoscopy This is an endoscopic procedure (a procedure that uses a device like a flexible telescope) that allows your caregiver to view the upper stomach and small bowel. This test allows your  caregiver to look at the esophagus. The esophagus carries food from your mouth to your stomach. They can also look at your duodenum. This is the first part of the small intestine that attaches to the stomach. This test is used to detect problems in the bowel such as ulcers and inflammation. PREPARATION FOR TEST Nothing to eat after midnight the day before the test. NORMAL FINDINGS Normal esophagus, stomach, and duodenum. Ranges for normal findings may vary among different laboratories and hospitals. You should always check with your doctor after having lab work or other tests done to discuss the meaning of your test results and whether your values are considered within normal limits. MEANING OF TEST  Your caregiver will go over the test results with you and discuss the importance and meaning of your results, as well as treatment options and the need for additional tests if necessary. OBTAINING THE TEST RESULTS It is your responsibility to obtain your test results.  Ask the lab or department performing the test when and how you will get your results. Document Released: 07/06/2004 Document Revised: 05/28/2011 Document Reviewed: 02/13/2008 [facility] Patient Information 2013 [place], [place]. Colonoscopy A colonoscopy is an exam to evaluate your entire colon. In this exam, your colon is cleansed. A long fiberoptic tube is inserted through your rectum and into your colon. The fiberoptic scope (endoscope) is a long bundle of enclosed and very flexible fibers. These fibers transmit light to the area examined and send images from that area to your caregiver. Discomfort is usually minimal. You may be given a drug to help you sleep (sedative) during or prior to the procedure. This exam helps to detect lumps (tumors), polyps, inflammation, and areas of bleeding. Your caregiver may also take a small piece of tissue (biopsy) that will be examined under a microscope. LET YOUR CAREGIVER KNOW ABOUT:   Allergies to food  or medicine.  Medicines taken, including vitamins, herbs, eyedrops, over-the-counter medicines, and creams.  Use of steroids (by mouth or creams).  Previous problems with anesthetics or numbing medicines.  History of bleeding problems or blood clots.  Previous surgery.  Other health problems, including diabetes and kidney problems.  Possibility of pregnancy, if this applies. BEFORE THE PROCEDURE   A clear liquid diet may be required for 2 days before the exam.  Ask your caregiver about changing or stopping your regular medications.  Liquid injections (enemas) or laxatives may be required.  A large amount of electrolyte solution may be given to you to drink over a short period of time. This solution is used to clean out your colon.  You should be present 60 minutes prior to your procedure or as directed by your caregiver. AFTER THE PROCEDURE   If you received a sedative or pain relieving medication, you will need to arrange for someone to drive you home.  Occasionally, there is a little blood passed with the first bowel movement. Do not be concerned. FINDING OUT THE RESULTS OF YOUR TEST Not all test results are available during your visit. If your test results are not back during the visit, make an appointment with your caregiver to find out the results. Do not assume everything is normal if you have not heard from your caregiver or the medical facility. It is important for you to follow up on all of your test results. HOME CARE INSTRUCTIONS   It is not unusual to pass moderate amounts of gas and experience mild abdominal cramping following the procedure. This is due to air being used to inflate your colon during the exam. Walking or a warm pack on your belly (abdomen) may help.  You may resume all normal meals and activities after sedatives and medicines have worn off.  Only take over-the-counter or prescription medicines for pain, discomfort, or fever as directed by your  caregiver. Do not use aspirin or blood thinners if a biopsy was taken. Consult your caregiver for medicine usage if biopsies were taken. SEEK IMMEDIATE MEDICAL CARE IF:   You have a fever.  You pass large blood clots or fill a toilet with blood following the procedure. This may also occur 10 to 14 days following the procedure. This is more likely if a biopsy was taken.  You develop abdominal pain that keeps getting worse and cannot be relieved with medicine. Document Released: 03/02/2000 Document Revised: 05/28/2011 Document Reviewed: 10/16/2007 [facility] Patient Information 2013 [place], [place].

## 2012-04-28 NOTE — Telephone Encounter (Signed)
Patient needs movi prep 

## 2012-04-29 ENCOUNTER — Encounter (HOSPITAL_COMMUNITY)
Admission: RE | Admit: 2012-04-29 | Discharge: 2012-04-29 | Disposition: A | Discharge status: Home / Self Care | Payer: Medicare Other | Source: Ambulatory Visit | Attending: Internal Medicine | Admitting: Internal Medicine

## 2012-05-03 ENCOUNTER — Other Ambulatory Visit: Payer: Self-pay

## 2012-05-28 ENCOUNTER — Encounter (HOSPITAL_COMMUNITY): Admission: RE | Admit: 2012-05-28 | Payer: Medicare Other | Source: Ambulatory Visit | Admitting: Internal Medicine

## 2012-06-03 ENCOUNTER — Other Ambulatory Visit (HOSPITAL_COMMUNITY): Payer: Medicare Other

## 2012-06-04 ENCOUNTER — Other Ambulatory Visit: Payer: Self-pay | Admitting: Family Medicine

## 2012-06-04 ENCOUNTER — Telehealth: Payer: Self-pay | Admitting: Family Medicine

## 2012-06-04 MED ORDER — LEVOTHYROXINE SODIUM 200 MCG PO TABS: ORAL_TABLET | ORAL | Status: DC

## 2012-06-04 NOTE — Patient Instructions (Addendum)
[name] Fitzpatrick  06/04/2012   Your procedure is scheduled on:  06/09/12  Report to [facility] at  615  AM.  Call this number if you have problems the morning of surgery: [phone]   Remember:   Do not eat food or drink liquids after midnight.   Take these medicines the morning of surgery with A SIP OF WATER: phenergan,zolft,dilaudid,valium,snythroid,avinza,kadian. Take combivent inhaler efore you come.   Do not wear jewelry, make-up or nail polish.  Do not wear lotions, powders, or perfumes.   Do not shave 48 hours prior to surgery. Men may shave face and neck.  Do not bring valuables to the hospital.  Contacts, dentures or bridgework may not be worn into surgery.  Leave suitcase in the car. After surgery it may be brought to your room.  For patients admitted to the hospital, checkout time is 11:00 AM the day of discharge.   Patients discharged the day of surgery will not be allowed to drive  home.  Name and phone number of your driver: family  Special Instructions: N/A   Please read over the following fact sheets that you were given: Pain Booklet, Coughing and Deep Breathing, Surgical Site Infection Prevention, Anesthesia Post-op Instructions and Care and Recovery After Surgery Esophageal Dilatation The esophagus is the long, narrow tube which carries food and liquid from the mouth to the stomach. Esophageal dilatation is the technique used to stretch a blocked or narrowed portion of the esophagus. This procedure is used when a part of the esophagus has become so narrow that it becomes difficult, painful or even impossible to swallow. This is generally an uncomplicated form of treatment. When this is not successful, chest surgery may be required. This is a much more extensive form of treatment with a longer recovery time. CAUSES  Some of the more common causes of blockage or strictures of the esophagus are:  Narrowing from longstanding inflammation (soreness and  redness) of the lower esophagus. This comes from the constant exposure of the lower esophagus to the acid which bubbles up from the stomach. Over time this causes scarring and narrowing of the lower esophagus.  Hiatal hernia in which a small part of the stomach bulges (herniates) up through the diaphragm. This can cause a gradual narrowing of the end of the esophagus.  Schatzki's Ring is a narrow ring of benign (non-cancerous) fibrous tissue which constricts the lower esophagus. The reason for this is not known.  Scleroderma is a connective tissue disorder that affects the esophagus and makes swallowing difficult.  Achalasia is an absence of nerves to the lower esophagus and to the esophageal sphincter. This is the circular muscle between the stomach and esophagus that relaxes to allow food into the stomach. After swallowing, it contracts to keep food in the stomach. This absence of nerves may be congenital (present since birth). This can cause irregular spasms of the lower esophageal muscle. This spasm does not open up to allow food and fluid through. The result is a persistent blockage with subsequent slow trickling of the esophageal contents into the stomach.  Strictures may develop from swallowing materials which damage the esophagus. Some examples are strong acids or alkalis such as lye.  Growths such as benign (non-cancerous) and malignant (cancerous) tumors can block the esophagus.  Heredity (present since birth) causes. DIAGNOSIS  Your caregiver often suspects this problem by taking a medical history. They will also do a physical exam. They can  then prove their suspicions using X-rays and endoscopy. Endoscopy is an exam in which a tube like a small flexible telescope is used to look at your esophagus.  TREATMENT There are different stretching (dilating) techniques which can be used. Simple bougie dilatation may be done in the office. This usually takes only a couple minutes. A numbing  (anesthetic) spray of the throat is used. Endoscopy, when done, is done in an endoscopy suite, under mild sedation. When fluoroscopy is used, the procedure is performed in X-ray. Other techniques require a little longer time. Recovery is usually quick. There is no waiting time to begin eating and drinking to test success of the treatment. Following are some of the methods used. Narrowing of the esophagus is treated by making it bigger. Commonly this is a mechanical problem which can be treated with stretching. This can be done in different ways. Your caregiver will discuss these with you. Some of the means used are:  A series of graduated (increasing thickness) flexible dilators can be used. These are weighted tubes passed through the esophagus into the stomach. The tubes used become progressively larger until the desired stretched size is reached. Graduated dilators are a simple and quick way of opening the esophagus. No visualization is required.  Another method is the use of endoscopy to place a flexible wire across the stricture. The endoscope is removed and the wire left in place. A dilator with a hole through it from end to end is guided down the esophagus and across the stricture. One or more of these dilators are passed over the wire. At the end of the exam, the wire is removed. This type of treatment may be performed in the X-ray department under fluoroscopy. An advantage of this procedure is the examiner is visualizing the end opening in the esophagus.  Stretching of the esophagus may be done using balloons. Deflated balloons are placed through the endoscope and across the stricture. This type of balloon dilatation is often done at the time of endoscopy or fluoroscopy. Flexible endoscopy allows the examiner to directly view the stricture. A balloon is inserted in the deflated form into the area of narrowing. It is then inflated with air to a certain pressure that is pre-set for a given  circumference. When inflated, it becomes sausage shaped, stretched, and makes the stricture larger.  Achalasia requires a longer larger balloon-type dilator. This is frequently done under X-ray control. In this situation, the spastic muscle fibers in the lower esophagus are stretched. All of the above procedures make the passage of food and water into the stomach easier. They also make it easier for stomach contents to reflux back into the esophagus. Special medications may be used following the procedure to help prevent further stricturing. Proton-pump inhibitor medications are good at decreasing the amount of acid in the stomach juice. When stomach juice refluxes into the esophagus, the juice is no longer as acidic and is less likely to burn or scar the esophagus. RISKS AND COMPLICATIONS Esophageal dilatation is usually performed effectively and without problems. Some complications that can occur are:  A small amount of bleeding almost always happens where the stretching takes place. If this is too excessive it may require more aggressive treatment.  An uncommon complication is perforation (making a hole) of the esophagus. The esophagus is thin. It is easy to make a hole in it. If this happens, an operation may be necessary to repair this.  A small, undetected perforation could lead to an infection  in the chest. This can be very serious. HOME CARE INSTRUCTIONS   If you received sedation for your procedure, do not drive, make important decisions, or perform any activities requiring your full coordination. Do not drink alcohol, take sedatives, or use any mind altering chemicals unless instructed by your caregiver.  You may use throat lozenges or warm salt water gargles if you have throat discomfort  You can begin eating and drinking normally on return home unless instructed otherwise. Do not purposely try to force large chunks of food down to test the benefits of your procedure.  Mild discomfort  can be eased with sips of ice water.  Medications for discomfort may or may not be needed. SEEK IMMEDIATE MEDICAL CARE IF:   You begin vomiting up blood.  You develop black tarry stools  You develop chills or an unexplained temperature of over 101 F (38.3 C)  You develop chest or abdominal pain.  You develop shortness of breath or feel lightheaded or faint.  Your swallowing is becoming more painful, difficult, or you are unable to swallow. MAKE SURE YOU:   Understand these instructions.  Will watch your condition.  Will get help right away if you are not doing well or get worse. Document Released: 04/26/2005 Document Revised: 05/28/2011 Document Reviewed: 06/13/2005 [facility] Patient Information 2013 [place], [place]. Colonoscopy A colonoscopy is an exam to evaluate your entire colon. In this exam, your colon is cleansed. A long fiberoptic tube is inserted through your rectum and into your colon. The fiberoptic scope (endoscope) is a long bundle of enclosed and very flexible fibers. These fibers transmit light to the area examined and send images from that area to your caregiver. Discomfort is usually minimal. You may be given a drug to help you sleep (sedative) during or prior to the procedure. This exam helps to detect lumps (tumors), polyps, inflammation, and areas of bleeding. Your caregiver may also take a small piece of tissue (biopsy) that will be examined under a microscope. LET YOUR CAREGIVER KNOW ABOUT:   Allergies to food or medicine.  Medicines taken, including vitamins, herbs, eyedrops, over-the-counter medicines, and creams.  Use of steroids (by mouth or creams).  Previous problems with anesthetics or numbing medicines.  History of bleeding problems or blood clots.  Previous surgery.  Other health problems, including diabetes and kidney problems.  Possibility of pregnancy, if this applies. BEFORE THE PROCEDURE   A clear liquid diet may be required for 2  days before the exam.  Ask your caregiver about changing or stopping your regular medications.  Liquid injections (enemas) or laxatives may be required.  A large amount of electrolyte solution may be given to you to drink over a short period of time. This solution is used to clean out your colon.  You should be present 60 minutes prior to your procedure or as directed by your caregiver. AFTER THE PROCEDURE   If you received a sedative or pain relieving medication, you will need to arrange for someone to drive you home.  Occasionally, there is a little blood passed with the first bowel movement. Do not be concerned. FINDING OUT THE RESULTS OF YOUR TEST Not all test results are available during your visit. If your test results are not back during the visit, make an appointment with your caregiver to find out the results. Do not assume everything is normal if you have not heard from your caregiver or the medical facility. It is important for you to follow up on all  of your test results. HOME CARE INSTRUCTIONS   It is not unusual to pass moderate amounts of gas and experience mild abdominal cramping following the procedure. This is due to air being used to inflate your colon during the exam. Walking or a warm pack on your belly (abdomen) may help.  You may resume all normal meals and activities after sedatives and medicines have worn off.  Only take over-the-counter or prescription medicines for pain, discomfort, or fever as directed by your caregiver. Do not use aspirin or blood thinners if a biopsy was taken. Consult your caregiver for medicine usage if biopsies were taken. SEEK IMMEDIATE MEDICAL CARE IF:   You have a fever.  You pass large blood clots or fill a toilet with blood following the procedure. This may also occur 10 to 14 days following the procedure. This is more likely if a biopsy was taken.  You develop abdominal pain that keeps getting worse and cannot be relieved with  medicine. Document Released: 03/02/2000 Document Revised: 05/28/2011 Document Reviewed: 10/16/2007 [facility] Patient Information 2013 [place], [place]. Esophagogastroduodenoscopy This is an endoscopic procedure (a procedure that uses a device like a flexible telescope) that allows your caregiver to view the upper stomach and small bowel. This test allows your caregiver to look at the esophagus. The esophagus carries food from your mouth to your stomach. They can also look at your duodenum. This is the first part of the small intestine that attaches to the stomach. This test is used to detect problems in the bowel such as ulcers and inflammation. PREPARATION FOR TEST Nothing to eat after midnight the day before the test. NORMAL FINDINGS Normal esophagus, stomach, and duodenum. Ranges for normal findings may vary among different laboratories and hospitals. You should always check with your doctor after having lab work or other tests done to discuss the meaning of your test results and whether your values are considered within normal limits. MEANING OF TEST  Your caregiver will go over the test results with you and discuss the importance and meaning of your results, as well as treatment options and the need for additional tests if necessary. OBTAINING THE TEST RESULTS It is your responsibility to obtain your test results. Ask the lab or department performing the test when and how you will get your results. Document Released: 07/06/2004 Document Revised: 05/28/2011 Document Reviewed: 02/13/2008 [facility] Patient Information 2013 [place], [place]. PATIENT INSTRUCTIONS POST-ANESTHESIA  IMMEDIATELY FOLLOWING SURGERY:  Do not drive or operate machinery for the first twenty four hours after surgery.  Do not make any important decisions for twenty four hours after surgery or while taking narcotic pain medications or sedatives.  If you develop intractable nausea and vomiting or a severe headache please notify  your doctor immediately.  FOLLOW-UP:  Please make an appointment with your surgeon as instructed. You do not need to follow up with anesthesia unless specifically instructed to do so.  WOUND CARE INSTRUCTIONS (if applicable):  Keep a dry clean dressing on the anesthesia/puncture wound site if there is drainage.  Once the wound has quit draining you may leave it open to air.  Generally you should leave the bandage intact for twenty four hours unless there is drainage.  If the epidural site drains for more than 36-48 hours please call the anesthesia department.  QUESTIONS?:  Please feel free to call your physician or the hospital operator if you have any questions, and they will be happy to assist you.

## 2012-06-04 NOTE — Telephone Encounter (Signed)
Med sent.

## 2012-06-05 ENCOUNTER — Encounter (HOSPITAL_COMMUNITY)
Admission: RE | Admit: 2012-06-05 | Discharge: 2012-06-05 | Disposition: A | Discharge status: Home / Self Care | Payer: Medicare Other | Source: Ambulatory Visit | Attending: Internal Medicine | Admitting: Internal Medicine

## 2012-06-09 ENCOUNTER — Encounter (HOSPITAL_COMMUNITY): Admission: RE | Payer: Self-pay | Source: Ambulatory Visit

## 2012-06-09 ENCOUNTER — Ambulatory Visit (HOSPITAL_COMMUNITY): Admission: RE | Admit: 2012-06-09 | Payer: Medicare Other | Source: Ambulatory Visit | Admitting: Internal Medicine

## 2012-06-09 SURGERY — COLONOSCOPY WITH PROPOFOL
Anesthesia: Monitor Anesthesia Care

## 2012-06-20 ENCOUNTER — Other Ambulatory Visit: Payer: Self-pay | Admitting: Family Medicine

## 2012-07-09 ENCOUNTER — Other Ambulatory Visit: Payer: Self-pay | Admitting: Family Medicine

## 2012-07-22 ENCOUNTER — Ambulatory Visit: Payer: Medicare Other | Admitting: Family Medicine

## 2012-08-14 ENCOUNTER — Other Ambulatory Visit: Payer: Self-pay

## 2012-08-14 ENCOUNTER — Encounter: Payer: Self-pay | Admitting: Family Medicine

## 2012-08-14 ENCOUNTER — Ambulatory Visit (INDEPENDENT_AMBULATORY_CARE_PROVIDER_SITE_OTHER): Payer: Medicare Other | Admitting: Family Medicine

## 2012-08-14 ENCOUNTER — Ambulatory Visit (HOSPITAL_COMMUNITY)
Admission: RE | Admit: 2012-08-14 | Discharge: 2012-08-14 | Disposition: A | Discharge status: Home / Self Care | Payer: Medicare Other | Source: Ambulatory Visit | Attending: Family Medicine | Admitting: Family Medicine

## 2012-08-14 VITALS — BP 110/68 | HR 96 | Resp 18 | Ht 62.0 in | Wt 209.0 lb

## 2012-08-14 DIAGNOSIS — J449 Chronic obstructive pulmonary disease, unspecified: Secondary | ICD-10-CM

## 2012-08-14 DIAGNOSIS — M545 Low back pain, unspecified: Secondary | ICD-10-CM

## 2012-08-14 DIAGNOSIS — F329 Major depressive disorder, single episode, unspecified: Secondary | ICD-10-CM

## 2012-08-14 DIAGNOSIS — J42 Unspecified chronic bronchitis: Secondary | ICD-10-CM | POA: Insufficient documentation

## 2012-08-14 DIAGNOSIS — R05 Cough: Secondary | ICD-10-CM | POA: Insufficient documentation

## 2012-08-14 DIAGNOSIS — R0989 Other specified symptoms and signs involving the circulatory and respiratory systems: Secondary | ICD-10-CM

## 2012-08-14 DIAGNOSIS — Z87891 Personal history of nicotine dependence: Secondary | ICD-10-CM | POA: Insufficient documentation

## 2012-08-14 DIAGNOSIS — E039 Hypothyroidism, unspecified: Secondary | ICD-10-CM

## 2012-08-14 DIAGNOSIS — R071 Chest pain on breathing: Secondary | ICD-10-CM | POA: Insufficient documentation

## 2012-08-14 DIAGNOSIS — R1900 Intra-abdominal and pelvic swelling, mass and lump, unspecified site: Secondary | ICD-10-CM | POA: Insufficient documentation

## 2012-08-14 DIAGNOSIS — R0600 Dyspnea, unspecified: Secondary | ICD-10-CM | POA: Insufficient documentation

## 2012-08-14 DIAGNOSIS — R0609 Other forms of dyspnea: Secondary | ICD-10-CM

## 2012-08-14 DIAGNOSIS — R059 Cough, unspecified: Secondary | ICD-10-CM | POA: Insufficient documentation

## 2012-08-14 MED ORDER — POLYETHYLENE GLYCOL 3350 17 GM/SCOOP PO POWD: 17.0000 g | Freq: Every day | ORAL | Status: DC

## 2012-08-14 MED ORDER — BENZONATATE 100 MG PO CAPS: 100.0000 mg | ORAL_CAPSULE | Freq: Four times a day (QID) | ORAL | Status: DC | PRN

## 2012-08-14 MED ORDER — SULFAMETHOXAZOLE-TRIMETHOPRIM 800-160 MG PO TABS: 1.0000 | ORAL_TABLET | Freq: Two times a day (BID) | ORAL | Status: AC

## 2012-08-14 NOTE — Progress Notes (Signed)
  Subjective:    Patient ID: [name] Fitzpatrick, female    DOB: 1955/11/22, 57 y.o.   MRN: [id number]  HPI 6 month h/o progressive dyspnea, sttaes Mom and uncle have pulmonary fibrosis, want to get pulmonary re eval States Thinks she was exposed to infection in the nursing home    in April she had severe infection which  Kept her in bed pand temp was up to 107!  C/o tender epigastric swelling for past few weeks, denies nausea, vomititing or change in bowel movements    Review of Systems See HPI . Denies chest pains, palpitations and leg swelling Denies  nausea, vomiting,diarrhea or constipation.   Denies dysuria, frequency, hesitancy or incontinence. Chronic  joint pain,  and limitation in mobility.Ambulates with a cane Denies headaches, seizures, numbness, or tingling. Denies uncontrolled  Has chronic , anxiety and nsomnia. Denies skin break down or rash.        Objective:   Physical Exam  Patient alert and oriented and in mild  cardiopulmonary distress.Oxygen dependent and does not have her oxygen with her  HEENT: No facial asymmetry, EOMI, no sinus tenderness,  oropharynx pink and moist.  Neck decreased ROM no adenopathy. Chest: Scattered crackles few wheezes, poor air entry throughout  CVS: S1, S2 no murmurs, no S3.  ABD: Soft tender swelling lower sternal border in xiphoid area. Bowel sounds normal.  Ext: No edema  MS: Decreased ROM spine, shoulders, hips and knees.  Skin: Intact, no ulcerations or rash noted.  Psych: Good eye contact, normal affect. Memory intact  anxious mildly depressed appearing.  CNS: CN 2-12 intact, power, normal throughout.       Assessment & Plan:

## 2012-08-14 NOTE — Patient Instructions (Addendum)
F/u ion 3.5 month, call if you need me before.  Fasting labs  And CXR today.  You are referred to [facility] abuer pulmonary re worsening breathing.  You are referred for [place] of your abdomen , re painful swelling  Medication will be refilled

## 2012-08-15 ENCOUNTER — Ambulatory Visit (HOSPITAL_COMMUNITY)
Admission: RE | Admit: 2012-08-15 | Discharge: 2012-08-15 | Disposition: A | Discharge status: Home / Self Care | Payer: Medicare Other | Source: Ambulatory Visit | Attending: Family Medicine | Admitting: Family Medicine

## 2012-08-15 ENCOUNTER — Other Ambulatory Visit: Payer: Self-pay | Admitting: Family Medicine

## 2012-08-15 DIAGNOSIS — R1900 Intra-abdominal and pelvic swelling, mass and lump, unspecified site: Secondary | ICD-10-CM

## 2012-08-15 DIAGNOSIS — R1906 Epigastric swelling, mass or lump: Secondary | ICD-10-CM | POA: Insufficient documentation

## 2012-08-15 LAB — LIPID PANEL
Total CHOL/HDL Ratio: 4 Ratio
VLDL: 46 mg/dL — ABNORMAL HIGH (ref 0–40)

## 2012-08-15 LAB — HEMOGLOBIN A1C
Hgb A1c MFr Bld: 5.5 % (ref <5.7)
Mean Plasma Glucose: 111 mg/dL (ref <117)

## 2012-08-15 LAB — COMPREHENSIVE METABOLIC PANEL
ALT: 13 U/L (ref 0–35)
AST: 16 U/L (ref 0–37)
Calcium: 9.7 mg/dL (ref 8.4–10.5)
Chloride: 100 mEq/L (ref 96–112)
Creat: 0.77 mg/dL (ref 0.50–1.10)
Sodium: 137 mEq/L (ref 135–145)
Total Protein: 7.1 g/dL (ref 6.0–8.3)

## 2012-08-16 NOTE — Assessment & Plan Note (Signed)
Tender epigastric mass, ultrasound ordered

## 2012-08-16 NOTE — Assessment & Plan Note (Signed)
Worsening , recently learned of pulmonary fibrosis in uncle and possily mother, wants furhter pulmonary evaluation

## 2012-08-16 NOTE — Assessment & Plan Note (Signed)
Stable, followed by psychiatry, no current suicidal or homicidal ideation

## 2012-08-16 NOTE — Assessment & Plan Note (Signed)
Controlled, no change in medication  

## 2012-08-16 NOTE — Assessment & Plan Note (Signed)
Reports worsening dyspnea, and has concerns of pulmonary fibrosis, requests second opinion on her pulmonary function, currently on supplemental oxygen through Dr Juanetta Gosling, will refer per pt request

## 2012-08-16 NOTE — Assessment & Plan Note (Signed)
Pain managed through pain clinic 

## 2012-08-16 NOTE — Assessment & Plan Note (Signed)
aciute flare of symptoms, ahs been ill for "weeks" decongestants and antibiotics prescribed

## 2012-08-18 ENCOUNTER — Other Ambulatory Visit: Payer: Self-pay | Admitting: Family Medicine

## 2012-08-18 DIAGNOSIS — R1900 Intra-abdominal and pelvic swelling, mass and lump, unspecified site: Secondary | ICD-10-CM

## 2012-08-21 ENCOUNTER — Other Ambulatory Visit (HOSPITAL_COMMUNITY): Payer: Self-pay

## 2012-09-09 ENCOUNTER — Institutional Professional Consult (permissible substitution): Payer: Self-pay | Admitting: Pulmonary Disease

## 2012-10-03 ENCOUNTER — Other Ambulatory Visit: Payer: Self-pay | Admitting: Family Medicine

## 2012-10-03 ENCOUNTER — Other Ambulatory Visit: Payer: Self-pay

## 2012-10-03 MED ORDER — LEVOTHYROXINE SODIUM 200 MCG PO TABS: ORAL_TABLET | ORAL | Status: DC

## 2012-11-05 ENCOUNTER — Encounter: Payer: Self-pay | Admitting: Advanced Practice Midwife

## 2012-11-05 ENCOUNTER — Ambulatory Visit (INDEPENDENT_AMBULATORY_CARE_PROVIDER_SITE_OTHER): Payer: Medicare Other | Admitting: Advanced Practice Midwife

## 2012-11-05 VITALS — BP 110/70 | Ht 62.2 in | Wt 210.0 lb

## 2012-11-05 DIAGNOSIS — N951 Menopausal and female climacteric states: Secondary | ICD-10-CM

## 2012-11-05 MED ORDER — ESTRADIOL 10 MCG VA TABS: ORAL_TABLET | VAGINAL | Status: DC

## 2012-11-05 NOTE — Progress Notes (Signed)
[name] Fitzpatrick 57 y.o.    Filed Vitals:   11/05/12 1420  BP: 110/70     Past Medical History: Past Medical History  Diagnosis Date  . Anxiety   . Depression   . Hypothyroidism   . Osteoarthritis   . IBS (irritable bowel syndrome)     with constipation  . Fibromyalgia   . Chronic LBP   . Migraine headache   . Chronic neck pain   . DM type 2 (diabetes mellitus, type 2)   . Hypertension   . COPD (chronic obstructive pulmonary disease)   . H/O suicide attempt     Past Surgical History: Past Surgical History  Procedure Laterality Date  . Reconstucted right ankle    . C-spine fusion  1991, 2004  . Vesicovaginal fistula closure w/ tah  1993    Fibroids no Ca  . Right knee arthroscopic surgery    . B/l foot surgery --bunions    . L carpal tunnel release    . L spine fusion x 2    . Total reconstruction of right ankle and heel      total of three   . Abdominal hysterectomy    . Cholecystectomy      Family History: Family History  Problem Relation Age of Onset  . Cancer Father     pancreatic   . Pancreatic cancer Father   . Heart failure Mother   . GI problems Mother   . Heart disease Mother   . Diabetes Mother   . Thyroid disease Sister   . Cancer Maternal Grandfather   . Cancer      family history     Social History: History  Substance Use Topics  . Smoking status: Former Smoker -- 0.30 packs/day for 40 years    Types: Cigarettes  . Smokeless tobacco: Not on file     Comment: quit 2 months ago   . Alcohol Use: No    Allergies:  Allergies  Allergen Reactions  . Prednisone Shortness Of Breath and Nausea And Vomiting  . Penicillins Other (See Comments)    Pt states she almost died after taking the following. She states that she had Stroke like symptoms post taking the medication  . Cephalexin Hives, Swelling and Rash  . Latex Rash and Other (See Comments)    Causes skin to tear easily  . Metoclopramide Hcl Hives and Rash     History of  Present Illness:  C/O dyspareunia (at beginning and during sex) for 1 year;  Also c/o vaginal bleeding (post coital and sometimes not) for 6-8 months.  Husband is not supportive at all, even when she showed him that she was bleeding.  Current Medications, Allergies, Past Medical History, Past Surgical History, Family History and Social History were reviewed in [employer] record.     Review of Systems   Patient denies any headaches, blurred vision, shortness of breath, chest pain, abdominal pain, problems with bowel movements.  C/O stress/urge incontinence   Physical Exam: SSE:  Very painful exam.  Has 3 tiny areas of excoriation on R side of vagina.  Does not appear to be a HSV lesion, more of just broken down skin.  Vagina pale, no rugae, tissues very thin.   Bladder drops somewhat with valsalva.  This also concerns pt and she wants treatment.  Impression:  Atrophic vaginitis cystocele   Plan: vagifem qd X 2 weeks, then QOD (states estrogen cream runs out) F/U 1 month  with dr. Despina Hidden for bladder eval/progress with vagifem

## 2012-12-01 ENCOUNTER — Telehealth: Payer: Self-pay | Admitting: Family Medicine

## 2012-12-01 NOTE — Telephone Encounter (Signed)
noted 

## 2012-12-02 ENCOUNTER — Ambulatory Visit (INDEPENDENT_AMBULATORY_CARE_PROVIDER_SITE_OTHER): Payer: Medicare Other | Admitting: Obstetrics & Gynecology

## 2012-12-02 ENCOUNTER — Encounter: Payer: Self-pay | Admitting: Obstetrics & Gynecology

## 2012-12-02 VITALS — BP 110/64 | Ht 62.0 in | Wt 215.5 lb

## 2012-12-02 DIAGNOSIS — N952 Postmenopausal atrophic vaginitis: Secondary | ICD-10-CM

## 2012-12-02 MED ORDER — ESTRADIOL 0.1 MG/GM VA CREA: 2.0000 g | TOPICAL_CREAM | Freq: Every day | VAGINAL | Status: DC

## 2012-12-03 ENCOUNTER — Ambulatory Visit: Payer: Medicare Other | Admitting: Family Medicine

## 2012-12-10 NOTE — Progress Notes (Signed)
Patient ID: [name] Fitzpatrick, female   DOB: [birth year], 56 y.o.   MRN: [id number] Chief Complaint  Patient presents with  . prolapse bladder, pain and bleeding after sex   Exam really no prolapse grade I at worse, moderate to severe vaginal atrophy with complete loss of ruggae No lacerations No discharge  Imp Vaginal atrophy No prolapse  Plan Vaginal estrogen 2 grams per night and follow up in 3 months

## 2013-01-02 ENCOUNTER — Telehealth: Payer: Self-pay | Admitting: Family Medicine

## 2013-01-02 DIAGNOSIS — G4734 Idiopathic sleep related nonobstructive alveolar hypoventilation: Secondary | ICD-10-CM

## 2013-01-02 NOTE — Telephone Encounter (Signed)
Patient  needs to be told that based on recent overnight test result she needs a sleep study to see what her oxygen needs are. I am referring her to Dr Gerilyn Pilgrim for further evaluation of , pls fax overnight sleep test report also

## 2013-01-06 ENCOUNTER — Other Ambulatory Visit: Payer: Self-pay | Admitting: Family Medicine

## 2013-01-06 DIAGNOSIS — Z139 Encounter for screening, unspecified: Secondary | ICD-10-CM

## 2013-01-15 ENCOUNTER — Telehealth: Payer: Self-pay | Admitting: Family Medicine

## 2013-01-15 ENCOUNTER — Other Ambulatory Visit: Payer: Self-pay | Admitting: Family Medicine

## 2013-01-15 MED ORDER — PROMETHAZINE-DM 6.25-15 MG/5ML PO SYRP: ORAL_SOLUTION | ORAL | Status: DC

## 2013-01-15 NOTE — Telephone Encounter (Signed)
Spoke directly with pt re the need for sleep eval based on recent overnight study, appears to have sleep apnea and will need treatment for this . She voiced understanding and intends to keep the appt. Stated she had excessive cough, and swelling in the back of her throat this morning on awaking, no fever. I sent in phenergan dm and advised salt water gargles and tylenol

## 2013-01-17 ENCOUNTER — Encounter: Payer: Self-pay | Admitting: Family Medicine

## 2013-01-19 ENCOUNTER — Ambulatory Visit (HOSPITAL_COMMUNITY): Payer: Self-pay

## 2013-01-22 ENCOUNTER — Other Ambulatory Visit: Payer: Self-pay

## 2013-01-28 ENCOUNTER — Telehealth: Payer: Self-pay | Admitting: Family Medicine

## 2013-01-29 NOTE — Telephone Encounter (Signed)
Patient is aware 

## 2013-02-17 ENCOUNTER — Telehealth: Payer: Self-pay | Admitting: Family Medicine

## 2013-02-17 NOTE — Telephone Encounter (Signed)
Noted  

## 2013-02-18 NOTE — Telephone Encounter (Signed)
Noted . I have reviewed the note, 24 hr oxygen at 2l/min recommended and sleep study  Flu vac was given and sh  cXR showed bronchiectasis so referred for high resolution chest CTshe is  Also referred to ENT

## 2013-03-03 ENCOUNTER — Ambulatory Visit: Payer: Medicare Other | Admitting: Obstetrics & Gynecology

## 2013-03-09 ENCOUNTER — Other Ambulatory Visit: Payer: Self-pay | Admitting: Family Medicine

## 2013-04-08 ENCOUNTER — Telehealth: Payer: Self-pay | Admitting: Family Medicine

## 2013-04-08 ENCOUNTER — Other Ambulatory Visit: Payer: Self-pay | Admitting: Family Medicine

## 2013-04-08 MED ORDER — LEVOTHYROXINE SODIUM 200 MCG PO TABS: ORAL_TABLET | ORAL | Status: DC

## 2013-04-08 NOTE — Telephone Encounter (Signed)
Med refilled.

## 2013-05-14 ENCOUNTER — Other Ambulatory Visit: Payer: Self-pay | Admitting: Family Medicine

## 2013-05-27 ENCOUNTER — Ambulatory Visit (INDEPENDENT_AMBULATORY_CARE_PROVIDER_SITE_OTHER): Payer: Medicare HMO | Admitting: Family Medicine

## 2013-05-27 ENCOUNTER — Encounter: Payer: Self-pay | Admitting: Family Medicine

## 2013-05-27 VITALS — BP 102/70 | HR 83 | Resp 16 | Wt 216.1 lb

## 2013-05-27 DIAGNOSIS — R42 Dizziness and giddiness: Secondary | ICD-10-CM

## 2013-05-27 DIAGNOSIS — M899 Disorder of bone, unspecified: Secondary | ICD-10-CM

## 2013-05-27 DIAGNOSIS — R5381 Other malaise: Secondary | ICD-10-CM

## 2013-05-27 DIAGNOSIS — R7302 Impaired glucose tolerance (oral): Secondary | ICD-10-CM

## 2013-05-27 DIAGNOSIS — M1990 Unspecified osteoarthritis, unspecified site: Secondary | ICD-10-CM

## 2013-05-27 DIAGNOSIS — Z79899 Other long term (current) drug therapy: Secondary | ICD-10-CM

## 2013-05-27 DIAGNOSIS — M949 Disorder of cartilage, unspecified: Secondary | ICD-10-CM

## 2013-05-27 DIAGNOSIS — J449 Chronic obstructive pulmonary disease, unspecified: Secondary | ICD-10-CM

## 2013-05-27 DIAGNOSIS — F3289 Other specified depressive episodes: Secondary | ICD-10-CM

## 2013-05-27 DIAGNOSIS — R079 Chest pain, unspecified: Secondary | ICD-10-CM | POA: Diagnosis not present

## 2013-05-27 DIAGNOSIS — E039 Hypothyroidism, unspecified: Secondary | ICD-10-CM | POA: Diagnosis not present

## 2013-05-27 DIAGNOSIS — R7301 Impaired fasting glucose: Secondary | ICD-10-CM

## 2013-05-27 DIAGNOSIS — F329 Major depressive disorder, single episode, unspecified: Secondary | ICD-10-CM

## 2013-05-27 DIAGNOSIS — J309 Allergic rhinitis, unspecified: Secondary | ICD-10-CM

## 2013-05-27 DIAGNOSIS — R7309 Other abnormal glucose: Secondary | ICD-10-CM

## 2013-05-27 DIAGNOSIS — R5383 Other fatigue: Secondary | ICD-10-CM

## 2013-05-27 LAB — COMPLETE METABOLIC PANEL WITH GFR
ALBUMIN: 4 g/dL (ref 3.5–5.2)
ALT: 15 U/L (ref 0–35)
AST: 21 U/L (ref 0–37)
Alkaline Phosphatase: 59 U/L (ref 39–117)
BUN: 17 mg/dL (ref 6–23)
CALCIUM: 8.8 mg/dL (ref 8.4–10.5)
CHLORIDE: 103 meq/L (ref 96–112)
CO2: 28 mEq/L (ref 19–32)
CREATININE: 0.71 mg/dL (ref 0.50–1.10)
GLUCOSE: 87 mg/dL (ref 70–99)
POTASSIUM: 4.3 meq/L (ref 3.5–5.3)
Sodium: 138 mEq/L (ref 135–145)
Total Bilirubin: 0.3 mg/dL (ref 0.2–1.2)
Total Protein: 6.7 g/dL (ref 6.0–8.3)

## 2013-05-27 LAB — LIPID PANEL
CHOLESTEROL: 197 mg/dL (ref 0–200)
HDL: 57 mg/dL (ref >39)
LDL Cholesterol: 106 mg/dL — ABNORMAL HIGH (ref 0–99)
TRIGLYCERIDES: 172 mg/dL — AB (ref <150)
Total CHOL/HDL Ratio: 3.5 Ratio
VLDL: 34 mg/dL (ref 0–40)

## 2013-05-27 LAB — CBC WITH DIFFERENTIAL/PLATELET
BASOS ABS: 0.1 10*3/uL (ref 0.0–0.1)
BASOS PCT: 1 % (ref 0–1)
EOS ABS: 0.3 10*3/uL (ref 0.0–0.7)
Eosinophils Relative: 4 % (ref 0–5)
HCT: 34.7 % — ABNORMAL LOW (ref 36.0–46.0)
HEMOGLOBIN: 12.3 g/dL (ref 12.0–15.0)
Lymphocytes Relative: 28 % (ref 12–46)
Lymphs Abs: 1.9 10*3/uL (ref 0.7–4.0)
MCH: 29.7 pg (ref 26.0–34.0)
MCHC: 35.4 g/dL (ref 30.0–36.0)
MCV: 83.8 fL (ref 78.0–100.0)
MONOS PCT: 5 % (ref 3–12)
Monocytes Absolute: 0.3 10*3/uL (ref 0.1–1.0)
NEUTROS ABS: 4.2 10*3/uL (ref 1.7–7.7)
NEUTROS PCT: 62 % (ref 43–77)
PLATELETS: 198 10*3/uL (ref 150–400)
RBC: 4.14 MIL/uL (ref 3.87–5.11)
RDW: 14.1 % (ref 11.5–15.5)
WBC: 6.8 10*3/uL (ref 4.0–10.5)

## 2013-05-27 LAB — HEMOGLOBIN A1C
Hgb A1c MFr Bld: 5.5 % (ref <5.7)
MEAN PLASMA GLUCOSE: 111 mg/dL (ref <117)

## 2013-05-27 LAB — TSH: TSH: 16.404 u[IU]/mL — AB (ref 0.350–4.500)

## 2013-05-27 MED ORDER — ONDANSETRON HCL 4 MG PO TABS: 4.0000 mg | ORAL_TABLET | Freq: Two times a day (BID) | ORAL | Status: DC | PRN

## 2013-05-27 MED ORDER — FLUTICASONE PROPIONATE 50 MCG/ACT NA SUSP: 2.0000 | Freq: Every day | NASAL | Status: DC

## 2013-05-27 MED ORDER — MECLIZINE HCL 12.5 MG PO TABS: 12.5000 mg | ORAL_TABLET | Freq: Three times a day (TID) | ORAL | Status: DC | PRN

## 2013-05-27 NOTE — Progress Notes (Signed)
Subjective:    Patient ID: [name] Fitzpatrick, female    DOB: 10-29-1955, 58 y.o.   MRN: [id number]  HPI The PT is here for follow up and re-evaluation of chronic medical conditions, medication management and review of any available recent lab and radiology data.  Preventive health is updated, specifically  Cancer screening and Immunization.   Questions or concerns regarding consultations or procedures which the PT has had in the interim are  Addressed .Seeing lung specialist in [place] and is on supplemental oxygen 24  Hrs per day New in the past 2 to 4 weeks is recurrent daily vertigo up to 5 times per day she experiences acute severe vertigo with nausea, no medication  For dizziness,, does have phenergan She has also been experiencing intermittent chest pain, during this time, not coincident with the vertigo, has awakened her from sleep, substernal , radiaiting to right jaw and right shoulder, at it's worse pain is a 10, maybe 3 to 5 times per week  Frustrated with inability to lose weight despite change in diet. Has not been in  For over 1 year , does not have her medications , and her lab work is overdue. She is also on chronic pain management by a specialist in [place] Ongoing stress, anxiety and depression, denies any recent decompensation, sees psychiatry also      Review of Systems See HPI Denies recent fever or chills. Increased allergy symptoms in past 2 to 3 weeks, nasal congestion, clear drainage and sneezing. Denies chest congestion, productive cough or wheezing. Denies PND, orthopnea, palpitations and leg swelling Denies abdominal pain, nausea, vomiting,diarrhea or constipation.   Denies dysuria, frequency, hesitancy or incontinence. Chronic  joint pain, swelling and limitation in mobility.On chronic pain meds through clinic Denies headaches or  seizures,. Denies uncontrolled  depression, anxiety or insomnia. Denies skin break down or rash.        Objective:   Physical Exam BP 102/70  Pulse 83  Resp 16  Wt 216 lb 1.9 oz (98.031 kg)  SpO2 98% Patient alert and oriented and in no cardiopulmonary distress.  HEENT: No facial asymmetry, EOMI,   oropharynx pink and moist.  Neck supple no JVD, no mass. TM clear bilaterally. No nystagmus. Nasal mucosa erythematous and edematous Chest: Clear to auscultation bilaterally.Decreased air entry throughout. No reproducible chest wall pain  CVS: S1, S2 no murmurs, no S3.Regular rate.  ABD: Soft non tender.   Ext: No edema  MS: Adequate though reduced ROM spine, shoulders, hips and knees.  Skin: Intact, no ulcerations or rash noted.  Psych: Good eye contact, normal affect. Memory intact mildly anxious not  depressed appearing.  CNS: CN 2-12 intact, power,  normal throughout.no focal deficits noted.        Assessment & Plan:  Vertigo New onset vertigo for past several weeks. No nystagmus on exam Refer to ET for further eval and management, antivert also prescribed  Chest pain Recent increased chest pain, non specific as far as related to activity, but concerning for CAD Pt refuses EKG. She is referred to cardiology in [place] for further eval  HYPOTHYROIDISM Under corrected and uncontrolled. Pt referred to local endo for management   IGT (impaired glucose tolerance) Patient educated about the importance of limiting  Carbohydrate intake , the need to commit to daily physical activity for a minimum of 30 minutes , and to commit weight loss. The fact that changes in all these areas will reduce or eliminate all together the development of  diabetes is stressed.   Congratulated on continued normoglycemia   OSTEOARTHRITIS Chronic pain management through pain MD in [place] control, treated by psychiatry  COPD (chronic obstructive pulmonary disease) Pt now on 24 hour per day spplemental oxygen  ALLERGIC RHINITIS Increased symptoms which are uncontrolled, medications  prescribed

## 2013-05-27 NOTE — Patient Instructions (Addendum)
Annual wellness in 4 month, call if you need me before  Labs today, HBA1C, lipid, cmp and EGFR, TSH, vit D , CBC  You are referred to Dr Benjamine Mola re dizziness, antivert and zofran are being sent in for symptom relief in the interim  You are referred to cardiologist at [facility] re chest pain    Nasal spray for allergies will be prescribed, and lasix and potaasium will be refilled when requested  HBA1C, chem 7 and TSH in 4 month non fast  You still need your coloonscopyFall Prevention and Home Safety Falls cause injuries and can affect all age groups. It is possible to prevent falls.  HOW TO PREVENT FALLS  Wear shoes with rubber soles that do not have an opening for your toes.  Keep the inside and outside of your house well lit.  Use night lights throughout your home.  Remove clutter from floors.  Clean up floor spills.  Remove throw rugs or fasten them to the floor with carpet tape.  Do not place electrical cords across pathways.  Put grab bars by your tub, shower, and toilet. Do not use towel bars as grab bars.  Put handrails on both sides of the stairway. Fix loose handrails.  Do not climb on stools or stepladders, if possible.  Do not wax your floors.  Repair uneven or unsafe sidewalks, walkways, or stairs.  Keep items you use a lot within reach.  Be aware of pets.  Keep emergency numbers next to the telephone.  Put smoke detectors in your home and near bedrooms. Ask your doctor what other things you can do to prevent falls. Document Released: 12/30/2008 Document Revised: 09/04/2011 Document Reviewed: 06/05/2011 [facility] Patient Information 2014 [place], [place].

## 2013-05-28 LAB — VITAMIN D 25 HYDROXY (VIT D DEFICIENCY, FRACTURES): VIT D 25 HYDROXY: 33 ng/mL (ref 30–89)

## 2013-05-31 ENCOUNTER — Other Ambulatory Visit: Payer: Self-pay | Admitting: Family Medicine

## 2013-05-31 DIAGNOSIS — E039 Hypothyroidism, unspecified: Secondary | ICD-10-CM

## 2013-06-01 ENCOUNTER — Encounter: Payer: Self-pay | Admitting: Family Medicine

## 2013-06-08 ENCOUNTER — Ambulatory Visit (HOSPITAL_COMMUNITY): Payer: Self-pay

## 2013-06-08 ENCOUNTER — Ambulatory Visit (HOSPITAL_COMMUNITY)
Admission: RE | Admit: 2013-06-08 | Discharge: 2013-06-08 | Disposition: A | Discharge status: Home / Self Care | Payer: Medicare HMO | Source: Ambulatory Visit | Attending: Family Medicine | Admitting: Family Medicine

## 2013-06-08 DIAGNOSIS — Z1231 Encounter for screening mammogram for malignant neoplasm of breast: Secondary | ICD-10-CM | POA: Insufficient documentation

## 2013-06-08 DIAGNOSIS — E041 Nontoxic single thyroid nodule: Secondary | ICD-10-CM | POA: Insufficient documentation

## 2013-06-08 DIAGNOSIS — E039 Hypothyroidism, unspecified: Secondary | ICD-10-CM | POA: Insufficient documentation

## 2013-06-08 DIAGNOSIS — Z139 Encounter for screening, unspecified: Secondary | ICD-10-CM

## 2013-06-09 ENCOUNTER — Other Ambulatory Visit: Payer: Self-pay

## 2013-06-09 ENCOUNTER — Encounter: Payer: Medicare FFS | Admitting: Family Medicine

## 2013-06-09 DIAGNOSIS — E041 Nontoxic single thyroid nodule: Secondary | ICD-10-CM

## 2013-06-17 ENCOUNTER — Other Ambulatory Visit (INDEPENDENT_AMBULATORY_CARE_PROVIDER_SITE_OTHER): Payer: Self-pay | Admitting: Otolaryngology

## 2013-06-17 DIAGNOSIS — R221 Localized swelling, mass and lump, neck: Secondary | ICD-10-CM

## 2013-06-19 ENCOUNTER — Other Ambulatory Visit: Payer: Self-pay | Admitting: Family Medicine

## 2013-06-23 ENCOUNTER — Ambulatory Visit (HOSPITAL_COMMUNITY)
Admission: RE | Admit: 2013-06-23 | Discharge: 2013-06-23 | Disposition: A | Discharge status: Home / Self Care | Payer: Medicare FFS | Source: Ambulatory Visit | Attending: Otolaryngology | Admitting: Otolaryngology

## 2013-06-23 ENCOUNTER — Other Ambulatory Visit (INDEPENDENT_AMBULATORY_CARE_PROVIDER_SITE_OTHER): Payer: Self-pay | Admitting: Otolaryngology

## 2013-06-23 DIAGNOSIS — R221 Localized swelling, mass and lump, neck: Secondary | ICD-10-CM

## 2013-06-23 DIAGNOSIS — E042 Nontoxic multinodular goiter: Secondary | ICD-10-CM | POA: Insufficient documentation

## 2013-06-23 MED ORDER — LIDOCAINE HCL (PF) 2 % IJ SOLN: 10.0000 mL | Freq: Once | INTRAMUSCULAR | Status: DC

## 2013-06-23 MED ORDER — LIDOCAINE HCL (PF) 2 % IJ SOLN
INTRAMUSCULAR | Status: AC
  Filled 2013-06-23: qty 10

## 2013-06-23 NOTE — Discharge Instructions (Signed)
Thyroid Biopsy °The thyroid gland is a butterfly-shaped gland situated in the front of the neck. It produces hormones which affect metabolism, growth and development, and body temperature. A thyroid biopsy is a procedure in which small samples of tissue or fluid are removed from the thyroid gland or mass and examined under a microscope. This test is done to determine the cause of thyroid problems, such as infection, cancer, or other thyroid problems. °There are 2 ways to obtain samples: °1. Fine needle biopsy. Samples are removed using a thin needle inserted through the skin and into the thyroid gland or mass. °2. Open biopsy. Samples are removed after a cut (incision) is made through the skin. °LET YOUR CAREGIVER KNOW ABOUT:  °· Allergies. °· Medications taken including herbs, eye drops, over-the-counter medications, and creams. °· Use of steroids (by mouth or creams). °· Previous problems with anesthetics or numbing medicine. °· Possibility of pregnancy, if this applies. °· History of blood clots (thrombophlebitis). °· History of bleeding or blood problems. °· Previous surgery. °· Other health problems. °RISKS AND COMPLICATIONS °· Bleeding from the site. The risk of bleeding is higher if you have a bleeding disorder or are taking any blood thinning medications (anticoagulants). °· Infection. °· Injury to structures near the thyroid gland. °BEFORE THE PROCEDURE  °This is a procedure that can be done as an outpatient. Confirm the time that you need to arrive for your procedure. Confirm whether there is a need to fast or withhold any medications. A blood sample may be done to determine your blood clotting time. Medicine may be given to help you relax (sedative). °PROCEDURE °Fine needle biopsy. °You will be awake during the procedure. You may be asked to lie on your back with your head tipped backward to extend your neck. Let your caregiver know if you cannot tolerate the positioning. An area on your neck will be  cleansed. A needle is inserted through the skin of your neck. You may feel a mild discomfort during this procedure. You may be asked to avoid coughing, talking, swallowing, or making sounds during some portions of the procedure. The needle is withdrawn once tissue or fluid samples have been removed. Pressure may be applied to the neck to reduce swelling and ensure that bleeding has stopped. The samples will be sent for examination.  °Open biopsy. °You will be given general anesthesia. You will be asleep during the procedure. An incision is made in your neck. A sample of thyroid tissue or the mass is removed. The tissue sample or mass will be sent for examination. The sample or mass may be examined during the biopsy. If the sample or mass contains cancer cells, some or all of the thyroid gland may be removed. The incision is closed with stitches. °AFTER THE PROCEDURE  °Your recovery will be assessed and monitored. If there are no problems, as an outpatient, you should be able to go home shortly after the procedure. °If you had a fine needle biopsy: °· You may have soreness at the biopsy site for 1 to 2 days. °If you had an open biopsy:  °· You may have soreness at the biopsy site for 3 to 4 days. °· You may have a hoarse voice or sore throat for 1 to 2 days. °Obtaining the Test Results °It is your responsibility to obtain your test results. Do not assume everything is normal if you have not heard from your caregiver or the medical facility. It is important for you to follow up   on all of your test results. °HOME CARE INSTRUCTIONS  °· Keeping your head raised on a pillow when you are lying down may ease biopsy site discomfort. °· Supporting the back of your head and neck with both hands as you sit up from a lying position may ease biopsy site discomfort. °· Only take over-the-counter or prescription medicines for pain, discomfort, or fever as directed by your caregiver. °· Throat lozenges or gargling with warm salt  water may help to soothe a sore throat. °SEEK IMMEDIATE MEDICAL CARE IF:  °· You have severe bleeding from the biopsy site. °· You have difficulty swallowing. °· You have a fever. °· You have increased pain, swelling, redness, or warmth at the biopsy site. °· You notice pus coming from the biopsy site. °· You have swollen glands (lymph nodes) in your neck. °Document Released: 12/31/2006 Document Revised: 06/30/2012 Document Reviewed: 06/02/2008 °[employer]® Patient Information ©2014 [employer]. ° °

## 2013-07-07 ENCOUNTER — Other Ambulatory Visit: Payer: Self-pay | Admitting: Family Medicine

## 2013-07-14 ENCOUNTER — Telehealth: Payer: Self-pay | Admitting: Family Medicine

## 2013-07-14 NOTE — Telephone Encounter (Signed)
Dr Emi Holes office closed today. Sent for last labs

## 2013-07-15 NOTE — Telephone Encounter (Signed)
I will send a message to Dr Dorris Fetch before responding to her

## 2013-07-15 NOTE — Telephone Encounter (Signed)
She is referred to Dr Dorris Fetch to get her thyroid dosing adjusted and will need to follow up with him . You masy let her know thatrecent test result is MUCH improved, but as far as dosing needs to have Dr Dorris Fetch address this

## 2013-07-15 NOTE — Telephone Encounter (Signed)
Patient aware and then stated she would not be going back to Dr Dorris Fetch because she didn't like him. Please advise

## 2013-07-20 ENCOUNTER — Telehealth: Payer: Self-pay | Admitting: Family Medicine

## 2013-07-20 DIAGNOSIS — E039 Hypothyroidism, unspecified: Secondary | ICD-10-CM

## 2013-07-20 DIAGNOSIS — E041 Nontoxic single thyroid nodule: Secondary | ICD-10-CM

## 2013-07-20 MED ORDER — LEVOTHYROXINE SODIUM 112 MCG PO TABS: ORAL_TABLET | ORAL | Status: DC

## 2013-07-20 NOTE — Telephone Encounter (Signed)
Thanks, ordr is entered

## 2013-07-20 NOTE — Telephone Encounter (Signed)
pls call pt , I did leave her a message to call you back this pm Dr Dorris Fetch recommends higher dose of synthroid 225 mcg daily, she needs to take on its own, with no other medication, so that it is well absorbed  I recommend that she get rept TSH, free T3 and free T4 in2 months, top see if the dose is correct, please order. He also had planned to repeat her US of the thyroid 4 months, after the initial one for follow up . Since she has a nodule that needs follow up and her thyroid was so uncontrolled when I saw her, and she was symptomatic, I recommend she have an endocrinologist follow her still. She gets most of her care through [facility], please let me know if she wants me to refer her to endocrinologist there, I will do so I DO need feedback on this phone message and please make every attempt to spk with her today This is very important  I am entering the new higher dose of synthroid 3 months onlly, pls send in after you speak with her

## 2013-07-20 NOTE — Addendum Note (Signed)
Addended by: Eual Fines on: 07/20/2013 01:33 PM   Modules accepted: Orders

## 2013-07-20 NOTE — Addendum Note (Signed)
Addended by: [name] E on: 07/20/2013 02:06 PM   Modules accepted: Orders

## 2013-07-20 NOTE — Telephone Encounter (Signed)
Pt aware. New dose med sent. Lab order mailed to her for 2 months. Accepted referral to [place]

## 2013-08-17 DIAGNOSIS — E063 Autoimmune thyroiditis: Secondary | ICD-10-CM

## 2013-08-17 HISTORY — DX: Autoimmune thyroiditis: E06.3

## 2013-09-30 ENCOUNTER — Encounter: Payer: Medicare FFS | Admitting: Family Medicine

## 2013-10-19 ENCOUNTER — Other Ambulatory Visit: Payer: Self-pay | Admitting: Family Medicine

## 2013-10-25 ENCOUNTER — Telehealth: Payer: Self-pay | Admitting: Family Medicine

## 2013-10-25 NOTE — Assessment & Plan Note (Signed)
Under corrected and uncontrolled. Pt referred to local endo for management

## 2013-10-25 NOTE — Assessment & Plan Note (Addendum)
Patient educated about the importance of limiting  Carbohydrate intake , the need to commit to daily physical activity for a minimum of 30 minutes , and to commit weight loss. The fact that changes in all these areas will reduce or eliminate all together the development of diabetes is stressed.   Congratulated on continued normoglycemia

## 2013-10-25 NOTE — Assessment & Plan Note (Signed)
Increased symptoms which are uncontrolled, medications prescribed

## 2013-10-25 NOTE — Assessment & Plan Note (Addendum)
Pt now on 24 hour per day spplemental oxygen

## 2013-10-25 NOTE — Telephone Encounter (Signed)
Recently synthroid was refilled from this office. Pt ABSOLUTELY NEEDS to have TSH  Reptd, she was referred to endo, refuses to follow up with Dr Dorris Fetch, when I last spoke with her, and had abn TSH lab in 2014 , when she last came in to this office. Pls contact her , explain the importance . If she does not have the TSH done fpor re eval I will not be able to continue to refill. Also she has no f/u scheduled, Should have scheduled 4 month annual wellness when last here, pls encourage her to sched as over due Pls send f/u on this call as I need to know , thanks

## 2013-10-25 NOTE — Assessment & Plan Note (Signed)
Chronic pain management through pain MD in [place]

## 2013-10-25 NOTE — Assessment & Plan Note (Signed)
Reports control, treated by psychiatry

## 2013-10-25 NOTE — Assessment & Plan Note (Signed)
New onset vertigo for past several weeks. No nystagmus on exam Refer to ET for further eval and management, antivert also prescribed

## 2013-10-25 NOTE — Assessment & Plan Note (Signed)
Recent increased chest pain, non specific as far as related to activity, but concerning for CAD Pt refuses EKG. She is referred to cardiology in [place] for further eval

## 2013-10-27 NOTE — Telephone Encounter (Signed)
Called and left message for patient to return call.  

## 2013-10-27 NOTE — Telephone Encounter (Signed)
Patient states that she is seen by [facility].  Dr. Paulla Dolly.  No further refills on synthroid from this office.   Note sent to Nadine.

## 2013-11-03 ENCOUNTER — Telehealth: Payer: Self-pay | Admitting: Family Medicine

## 2013-11-04 ENCOUNTER — Encounter: Payer: Self-pay | Admitting: Family Medicine

## 2013-11-04 ENCOUNTER — Ambulatory Visit (INDEPENDENT_AMBULATORY_CARE_PROVIDER_SITE_OTHER): Payer: Medicare FFS | Admitting: Family Medicine

## 2013-11-04 VITALS — BP 96/60 | HR 80 | Resp 18 | Ht 62.0 in | Wt 208.0 lb

## 2013-11-04 DIAGNOSIS — R110 Nausea: Secondary | ICD-10-CM

## 2013-11-04 DIAGNOSIS — R7302 Impaired glucose tolerance (oral): Secondary | ICD-10-CM

## 2013-11-04 DIAGNOSIS — D179 Benign lipomatous neoplasm, unspecified: Secondary | ICD-10-CM

## 2013-11-04 DIAGNOSIS — F329 Major depressive disorder, single episode, unspecified: Secondary | ICD-10-CM

## 2013-11-04 DIAGNOSIS — R911 Solitary pulmonary nodule: Secondary | ICD-10-CM

## 2013-11-04 DIAGNOSIS — F3289 Other specified depressive episodes: Secondary | ICD-10-CM

## 2013-11-04 DIAGNOSIS — R42 Dizziness and giddiness: Secondary | ICD-10-CM

## 2013-11-04 DIAGNOSIS — R7301 Impaired fasting glucose: Secondary | ICD-10-CM

## 2013-11-04 DIAGNOSIS — R7309 Other abnormal glucose: Secondary | ICD-10-CM

## 2013-11-04 DIAGNOSIS — R350 Frequency of micturition: Secondary | ICD-10-CM

## 2013-11-04 DIAGNOSIS — F411 Generalized anxiety disorder: Secondary | ICD-10-CM

## 2013-11-04 DIAGNOSIS — M1990 Unspecified osteoarthritis, unspecified site: Secondary | ICD-10-CM

## 2013-11-04 LAB — POCT URINALYSIS DIPSTICK
Bilirubin, UA: NEGATIVE
Blood, UA: NEGATIVE
Glucose, UA: NEGATIVE
Leukocytes, UA: NEGATIVE
Nitrite, UA: NEGATIVE
PH UA: 6
PROTEIN UA: NEGATIVE
SPEC GRAV UA: 1.025
Urobilinogen, UA: 0.2

## 2013-11-04 MED ORDER — ONDANSETRON HCL 4 MG PO TABS: 4.0000 mg | ORAL_TABLET | Freq: Two times a day (BID) | ORAL | Status: DC | PRN

## 2013-11-04 MED ORDER — POLYETHYLENE GLYCOL 3350 17 G PO PACK: 25.0000 g | PACK | Freq: Every day | ORAL | Status: DC | PRN

## 2013-11-04 MED ORDER — SOLIFENACIN SUCCINATE 10 MG PO TABS: 5.0000 mg | ORAL_TABLET | Freq: Every day | ORAL | Status: DC

## 2013-11-04 NOTE — Patient Instructions (Addendum)
Annual wellness in   December, call if you need me before  Urine is being checked for infection  New tablet vesicare 5mg  prescribed to help with urine symptoms, call in 2 weeks if no better you will be referred to urologist   Speak with your lung specialist at  [facility] re best scan you need as lung cancer screening test, and he may order if he feels this is a good idea  hBA1C chem 7 today  Ask surgeon you are seeing at [facility] to check the multiple nodules on your skin, I believe they are "fatty tumors", may or may not recommend removal/ biopsy

## 2013-11-04 NOTE — Progress Notes (Signed)
   Subjective:    Patient ID: [name] Fitzpatrick, female    DOB: 12-26-1955, 58 y.o.   MRN: [id number]  HPI    6 month h/o abnormal urination, frequency , hesitancy, nocturia, awakens her at night, up to 4 times at night, also reports incomplete bladder emptying. No fever, chills or flank pain Thyroid is still being stabilied as far as meds are concerned, she also states surgery is planned on the gland as she is ex[periencing increased episodes of difficulty swallowing Currently supposed to be on 24 hour oxygen , none with her aq this visit however. Denies uncontrolled pain, depression or anxiety No resumption of cigarettes   Review of Systems See HPI Denies recent fever or chills. Denies sinus pressure, nasal congestion, ear pain or sore throat. Denies chest congestion, productive cough or wheezing. Denies chest pains, palpitations and leg swelling Denies abdominal pain, , vomiting,diarrhea or constipation. Has chronic nausea, with intermittent uncontrolled symptoms , also intermittent vertigo, sees ENT at [facility]   Denies headaches, seizures,  Denies skin break down or rash.        Objective:   Physical Exam BP 96/60  Pulse 80  Resp 18  Ht 5\' 2"  (1.575 m)  Wt 208 lb (94.348 kg)  BMI 38.03 kg/m2  SpO2 97% Patient alert and oriented and in no cardiopulmonary distress.Anxious  HEENT: No facial asymmetry, EOMI,   oropharynx pink and moist.  Neck supple no JVD, no mass.  Chest: Adequate though reduced air entry , with  few wheezes bilaterally.  CVS: S1, S2  murmurs, no S3.Regular rate.  ABD: Soft non tender.   Ext: No edema  MS: decreased ROM spine, shoulders, hips and knees.  Skin: Intact, no ulcerations or rash noted.lipomas palpated in at leas 3 different areas, tender to deep palaption  Psych: Good eye contact, normal affect. Memory intact  anxious not depressed appearing.  CNS: CN 2-12 intact, power,  normal throughout.no focal deficits noted.          Assessment & Plan:  Urinary frequency UA is negative, trial of med for incontinence , however, pt reports feeling of incomplete emptying a;so I believe that she needs urology eval , however , opting to defer the referral at this time, and understand the need to call back for referral if she changes her mind  IGT (impaired glucose tolerance) Patient educated about the importance of limiting  Carbohydrate intake , the need to commit to daily physical activity for a minimum of 30 minutes , and to commit weight loss. The fact that changes in all these areas will reduce or eliminate all together the development of diabetes is stressed.   Updated lab on day of visit is normal  DEPRESSION Controlled, no change in medication Managed by psych  Vertigo Intermittent vertigo, antivert ,as needed  ANXIETY Chronic moderately severe anxiety, managed by psych  Nausea Intermittent nausea, zofran prescribed in limited amount  Pulmonary nodule Discussed role of lung cancer screening with pt with long smoking history and bnormal chest scan She is to further address with her pulmonolgist at next visit  OSTEOARTHRITIS Chronic pain management through [facility]  Multiple lipomas Recommend she have surgeon seeing her in the near future re thyroid , to also evaluate  The nodules , which in ,my opinion are  lipomas,

## 2013-11-05 ENCOUNTER — Encounter: Payer: Self-pay | Admitting: Family Medicine

## 2013-11-05 LAB — BASIC METABOLIC PANEL
BUN: 16 mg/dL (ref 6–23)
CO2: 28 meq/L (ref 19–32)
CREATININE: 0.79 mg/dL (ref 0.50–1.10)
Calcium: 9.9 mg/dL (ref 8.4–10.5)
Chloride: 102 mEq/L (ref 96–112)
GLUCOSE: 82 mg/dL (ref 70–99)
Potassium: 4.6 mEq/L (ref 3.5–5.3)
Sodium: 139 mEq/L (ref 135–145)

## 2013-11-05 LAB — HEMOGLOBIN A1C
Hgb A1c MFr Bld: 5.5 % (ref <5.7)
Mean Plasma Glucose: 111 mg/dL (ref <117)

## 2013-11-07 ENCOUNTER — Encounter (HOSPITAL_COMMUNITY): Payer: Self-pay | Admitting: Emergency Medicine

## 2013-11-07 ENCOUNTER — Inpatient Hospital Stay (HOSPITAL_COMMUNITY)
Admission: EM | Admit: 2013-11-07 | Discharge: 2013-11-16 | DRG: 478 | Disposition: A | Discharge status: Skilled Nursing Facility | Payer: Medicare FFS | Attending: Internal Medicine | Admitting: Internal Medicine

## 2013-11-07 DIAGNOSIS — R350 Frequency of micturition: Secondary | ICD-10-CM

## 2013-11-07 DIAGNOSIS — F4310 Post-traumatic stress disorder, unspecified: Secondary | ICD-10-CM | POA: Diagnosis present

## 2013-11-07 DIAGNOSIS — Z87891 Personal history of nicotine dependence: Secondary | ICD-10-CM

## 2013-11-07 DIAGNOSIS — W19XXXD Unspecified fall, subsequent encounter: Secondary | ICD-10-CM

## 2013-11-07 DIAGNOSIS — E039 Hypothyroidism, unspecified: Secondary | ICD-10-CM

## 2013-11-07 DIAGNOSIS — R609 Edema, unspecified: Secondary | ICD-10-CM

## 2013-11-07 DIAGNOSIS — M542 Cervicalgia: Secondary | ICD-10-CM

## 2013-11-07 DIAGNOSIS — W19XXXA Unspecified fall, initial encounter: Secondary | ICD-10-CM

## 2013-11-07 DIAGNOSIS — I341 Nonrheumatic mitral (valve) prolapse: Secondary | ICD-10-CM

## 2013-11-07 DIAGNOSIS — J449 Chronic obstructive pulmonary disease, unspecified: Secondary | ICD-10-CM | POA: Diagnosis present

## 2013-11-07 DIAGNOSIS — W010XXA Fall on same level from slipping, tripping and stumbling without subsequent striking against object, initial encounter: Secondary | ICD-10-CM | POA: Diagnosis present

## 2013-11-07 DIAGNOSIS — F3289 Other specified depressive episodes: Secondary | ICD-10-CM

## 2013-11-07 DIAGNOSIS — R519 Headache, unspecified: Secondary | ICD-10-CM

## 2013-11-07 DIAGNOSIS — R45851 Suicidal ideations: Secondary | ICD-10-CM

## 2013-11-07 DIAGNOSIS — K589 Irritable bowel syndrome without diarrhea: Secondary | ICD-10-CM

## 2013-11-07 DIAGNOSIS — F0781 Postconcussional syndrome: Secondary | ICD-10-CM

## 2013-11-07 DIAGNOSIS — R0600 Dyspnea, unspecified: Secondary | ICD-10-CM

## 2013-11-07 DIAGNOSIS — Z800 Family history of malignant neoplasm of digestive organs: Secondary | ICD-10-CM

## 2013-11-07 DIAGNOSIS — J9602 Acute respiratory failure with hypercapnia: Secondary | ICD-10-CM

## 2013-11-07 DIAGNOSIS — R51 Headache: Secondary | ICD-10-CM

## 2013-11-07 DIAGNOSIS — Z981 Arthrodesis status: Secondary | ICD-10-CM

## 2013-11-07 DIAGNOSIS — Z8249 Family history of ischemic heart disease and other diseases of the circulatory system: Secondary | ICD-10-CM

## 2013-11-07 DIAGNOSIS — F329 Major depressive disorder, single episode, unspecified: Secondary | ICD-10-CM

## 2013-11-07 DIAGNOSIS — N951 Menopausal and female climacteric states: Secondary | ICD-10-CM

## 2013-11-07 DIAGNOSIS — R7302 Impaired glucose tolerance (oral): Secondary | ICD-10-CM

## 2013-11-07 DIAGNOSIS — F332 Major depressive disorder, recurrent severe without psychotic features: Secondary | ICD-10-CM

## 2013-11-07 DIAGNOSIS — S82891A Other fracture of right lower leg, initial encounter for closed fracture: Secondary | ICD-10-CM

## 2013-11-07 DIAGNOSIS — S32009A Unspecified fracture of unspecified lumbar vertebra, initial encounter for closed fracture: Secondary | ICD-10-CM | POA: Diagnosis not present

## 2013-11-07 DIAGNOSIS — Z833 Family history of diabetes mellitus: Secondary | ICD-10-CM

## 2013-11-07 DIAGNOSIS — M549 Dorsalgia, unspecified: Secondary | ICD-10-CM | POA: Diagnosis not present

## 2013-11-07 DIAGNOSIS — Z880 Allergy status to penicillin: Secondary | ICD-10-CM

## 2013-11-07 DIAGNOSIS — Z91040 Latex allergy status: Secondary | ICD-10-CM

## 2013-11-07 DIAGNOSIS — E119 Type 2 diabetes mellitus without complications: Secondary | ICD-10-CM | POA: Diagnosis present

## 2013-11-07 DIAGNOSIS — Y92009 Unspecified place in unspecified non-institutional (private) residence as the place of occurrence of the external cause: Secondary | ICD-10-CM

## 2013-11-07 DIAGNOSIS — J4489 Other specified chronic obstructive pulmonary disease: Secondary | ICD-10-CM | POA: Diagnosis present

## 2013-11-07 DIAGNOSIS — F411 Generalized anxiety disorder: Secondary | ICD-10-CM

## 2013-11-07 DIAGNOSIS — G894 Chronic pain syndrome: Secondary | ICD-10-CM | POA: Diagnosis present

## 2013-11-07 DIAGNOSIS — E669 Obesity, unspecified: Secondary | ICD-10-CM

## 2013-11-07 DIAGNOSIS — R0603 Acute respiratory distress: Secondary | ICD-10-CM

## 2013-11-07 DIAGNOSIS — S32010A Wedge compression fracture of first lumbar vertebra, initial encounter for closed fracture: Secondary | ICD-10-CM

## 2013-11-07 DIAGNOSIS — Z7982 Long term (current) use of aspirin: Secondary | ICD-10-CM

## 2013-11-07 DIAGNOSIS — J438 Other emphysema: Secondary | ICD-10-CM

## 2013-11-07 DIAGNOSIS — M1990 Unspecified osteoarthritis, unspecified site: Secondary | ICD-10-CM

## 2013-11-07 DIAGNOSIS — G43909 Migraine, unspecified, not intractable, without status migrainosus: Secondary | ICD-10-CM | POA: Diagnosis present

## 2013-11-07 DIAGNOSIS — J309 Allergic rhinitis, unspecified: Secondary | ICD-10-CM

## 2013-11-07 DIAGNOSIS — D179 Benign lipomatous neoplasm, unspecified: Secondary | ICD-10-CM

## 2013-11-07 DIAGNOSIS — Z79899 Other long term (current) drug therapy: Secondary | ICD-10-CM

## 2013-11-07 DIAGNOSIS — Z885 Allergy status to narcotic agent status: Secondary | ICD-10-CM

## 2013-11-07 DIAGNOSIS — T85698A Other mechanical complication of other specified internal prosthetic devices, implants and grafts, initial encounter: Secondary | ICD-10-CM | POA: Diagnosis present

## 2013-11-07 DIAGNOSIS — R110 Nausea: Secondary | ICD-10-CM

## 2013-11-07 DIAGNOSIS — IMO0001 Reserved for inherently not codable concepts without codable children: Secondary | ICD-10-CM | POA: Diagnosis present

## 2013-11-07 DIAGNOSIS — R911 Solitary pulmonary nodule: Secondary | ICD-10-CM

## 2013-11-07 DIAGNOSIS — R42 Dizziness and giddiness: Secondary | ICD-10-CM

## 2013-11-07 DIAGNOSIS — Z888 Allergy status to other drugs, medicaments and biological substances status: Secondary | ICD-10-CM

## 2013-11-07 DIAGNOSIS — Y849 Medical procedure, unspecified as the cause of abnormal reaction of the patient, or of later complication, without mention of misadventure at the time of the procedure: Secondary | ICD-10-CM | POA: Diagnosis present

## 2013-11-07 DIAGNOSIS — J383 Other diseases of vocal cords: Secondary | ICD-10-CM

## 2013-11-07 DIAGNOSIS — J439 Emphysema, unspecified: Secondary | ICD-10-CM

## 2013-11-07 MED ORDER — ONDANSETRON HCL 4 MG/2ML IJ SOLN
4.0000 mg | Freq: Once | INTRAMUSCULAR | Status: AC
  Administered 2013-11-08: 4 mg via INTRAVENOUS
  Filled 2013-11-07: qty 2

## 2013-11-07 MED ORDER — MORPHINE SULFATE 4 MG/ML IJ SOLN
4.0000 mg | Freq: Once | INTRAMUSCULAR | Status: AC
  Administered 2013-11-08: 4 mg via INTRAVENOUS
  Filled 2013-11-07: qty 1

## 2013-11-07 NOTE — ED Notes (Signed)
Tripped and fell; pain in right wrist and lower back per pt.

## 2013-11-07 NOTE — ED Notes (Signed)
Patient states that she is also hurting in her right ankle from the fall.

## 2013-11-07 NOTE — ED Provider Notes (Signed)
CSN: [id number]     Arrival date & time 11/07/13  2154 History   First MD Initiated Contact with Patient 11/07/13 2310    This chart was scribed for Phillips Grout, MD by Terressa Koyanagi, ED Scribe. This patient was seen in room 5N25C/5N25C-01 and the patient's care was started at 11:45 PM.  Chief Complaint  Patient presents with  . Fall  . Back Pain  . Wrist Pain   PCP: [name], MD  Patient is a 58 y.o. female presenting with back pain and wrist pain. The history is provided by the patient. No language interpreter was used.  Back Pain Associated symptoms: no abdominal pain, no chest pain, no dysuria, no fever, no headaches, no numbness and no weakness   Wrist Pain Pertinent negatives include no chest pain, no abdominal pain, no headaches and no shortness of breath.   HPI Comments: [name] Fitzpatrick is a 58 y.o. female, with medical Hx noted below and who uses a cane at baseline, who presents to the Emergency Department complaining of an unwitnessed fall sustained this evening. Pt states she believes she must have tripped over some shoes leading to the fall. Pt specifies that she was in a standing position when she fell. Pt cannot recall if there was any head trauma. Pt states that she fell on her back and it felt like "as if my spine came out of my throat." Pt complains of associated lower back pain, right wrist pain, pain to her back with movement of lower extremities, and nausea. She denies any loss of consciousness. She denies any focal weakness or numbness.  Pt reports she is allergic to prednisone, penicillins, cephalexin, latex and metoclopramide.   Past Medical History  Diagnosis Date  . Anxiety   . Depression   . Hypothyroidism   . Osteoarthritis   . IBS (irritable bowel syndrome)     with constipation  . Fibromyalgia   . Chronic LBP   . Migraine headache   . Chronic neck pain   . DM type 2 (diabetes mellitus, type 2)   . Hypertension   . COPD (chronic  obstructive pulmonary disease)   . H/O suicide attempt    Past Surgical History  Procedure Laterality Date  . Reconstucted right ankle    . C-spine fusion  1991, 2004  . Vesicovaginal fistula closure w/ tah  1993    Fibroids no Ca  . Right knee arthroscopic surgery    . B/l foot surgery --bunions    . L carpal tunnel release    . L spine fusion x 2    . Total reconstruction of right ankle and heel      total of three   . Abdominal hysterectomy    . Cholecystectomy     Family History  Problem Relation Age of Onset  . Cancer Father     pancreatic   . Pancreatic cancer Father   . Heart failure Mother   . GI problems Mother   . Heart disease Mother   . Diabetes Mother   . Thyroid disease Sister   . Cancer Maternal Grandfather   . Cancer      family history    History  Substance Use Topics  . Smoking status: Former Smoker -- 0.30 packs/day for 40 years    Types: Cigarettes  . Smokeless tobacco: Not on file     Comment: quit 2 months ago   . Alcohol Use: No   OB History  Grav Para Term Preterm Abortions TAB SAB Ect Mult Living                 Review of Systems  Constitutional: Negative for fever and chills.  Respiratory: Negative for shortness of breath.   Cardiovascular: Negative for chest pain.  Gastrointestinal: Positive for nausea. Negative for vomiting, abdominal pain and diarrhea.  Genitourinary: Negative for dysuria and flank pain.  Musculoskeletal: Positive for arthralgias, back pain and myalgias. Negative for neck pain and neck stiffness.  Skin: Positive for wound. Negative for rash.  Neurological: Negative for dizziness, syncope, weakness, light-headedness, numbness and headaches.  All other systems reviewed and are negative.     Allergies  Prednisone; Penicillins; Cephalexin; Latex; and Metoclopramide hcl  Home Medications   Prior to Admission medications   Medication Sig Start Date End Date Taking? Authorizing Provider  dicyclomine (BENTYL) 10  MG capsule TAKE 1 CAPSULE BY MOUTH TWICE DAILY.   Yes Fayrene Helper, MD  estradiol (ESTRACE VAGINAL) 0.1 MG/GM vaginal cream Place 2.20 Applicatorfuls vaginally daily. 12/02/12  Yes Florian Buff, MD  fluticasone (FLONASE) 50 MCG/ACT nasal spray Place 2 sprays into both nostrils daily. 05/27/13  Yes Fayrene Helper, MD  levothyroxine (SYNTHROID, LEVOTHROID) 112 MCG tablet TAKE 2 TABLETS DAILY BEFORE BREAKFAST. 10/19/13  Yes Fayrene Helper, MD  lidocaine (LIDODERM) 5 % Place 1 patch onto the skin daily.  10/21/12  Yes Historical Provider, MD  meclizine (ANTIVERT) 12.5 MG tablet Take 1 tablet (12.5 mg total) by mouth 3 (three) times daily as needed for dizziness. 05/27/13  Yes Fayrene Helper, MD  methadone (DOLOPHINE) 10 MG tablet Take 10 mg by mouth 3 (three) times daily.   Yes Historical Provider, MD  ondansetron (ZOFRAN) 4 MG tablet Take 1 tablet (4 mg total) by mouth 2 (two) times daily as needed for nausea or vomiting. 11/04/13  Yes Fayrene Helper, MD  peg 3350 powder (MOVIPREP) 100 G SOLR Take 1 kit (100 g total) by mouth once. 04/28/12  Yes Butch Penny, NP  polyethylene glycol (MIRALAX / GLYCOLAX) packet Take 25 g by mouth daily as needed. For constipation   Dissolved in water or juice as needed for constipation 11/04/13  Yes Fayrene Helper, MD  potassium chloride SA (K-DUR,KLOR-CON) 20 MEQ tablet Take 1 tablet (20 mEq total) by mouth 2 (two) times daily. 03/20/12  Yes Fayrene Helper, MD  promethazine-dextromethorphan (PROMETHAZINE-DM) 6.25-15 MG/5ML syrup One teaspoon two  times daily ,as needed,for excessive cough 01/15/13  Yes Fayrene Helper, MD  sertraline (ZOLOFT) 100 MG tablet Take 100 mg by mouth daily.   Yes Historical Provider, MD  solifenacin (VESICARE) 10 MG tablet Take 0.5 tablets (5 mg total) by mouth daily. 11/04/13  Yes Fayrene Helper, MD  traZODone (DESYREL) 150 MG tablet Take 1 tablet (150 mg total) by mouth at bedtime as needed. For sleep 07/13/11  Yes  Encarnacion Slates, NP  albuterol-ipratropium (COMBIVENT) 18-103 MCG/ACT inhaler Inhale 2 puffs into the lungs 3 (three) times daily. For shortness of breath.  Take two puffs by mouth three times a day 07/13/11   Encarnacion Slates, NP  aspirin (ASPIRIN LOW DOSE) 81 MG EC tablet Take 1 tablet (81 mg total) by mouth at bedtime. For mild pain / blood thinner   Take one tablet by mouth daily 07/13/11   Encarnacion Slates, NP  furosemide (LASIX) 20 MG tablet Take 1 tablet (20 mg total) by mouth 2 (two) times daily.  03/20/12 11/04/13  Fayrene Helper, MD   Triage Vitals: BP 109/58  Pulse 72  Temp(Src) 99 F (37.2 C) (Oral)  Resp 20  Ht _0  (1.575 m)  Wt 208 lb (94.348 kg)  BMI 38.03 kg/m2  SpO2 99% Physical Exam  Nursing note and vitals reviewed. Constitutional: She is oriented to person, place, and time. She appears well-developed and well-nourished. She appears distressed.  HENT:  Head: Normocephalic and atraumatic.  Mouth/Throat: Oropharynx is clear and moist.  No obvious trauma to the head or neck.  Eyes: EOM are normal. Pupils are equal, round, and reactive to light.  Neck: Normal range of motion. Neck supple.  No posterior midline cervical tenderness to palpation.  Cardiovascular: Normal rate and regular rhythm.  Exam reveals no gallop and no friction rub.   No murmur heard. Pulmonary/Chest: Effort normal and breath sounds normal. No respiratory distress. She has no wheezes. She has no rales. She exhibits no tenderness.  Abdominal: Soft. Bowel sounds are normal. She exhibits no distension and no mass. There is no tenderness. There is no rebound and no guarding.  Musculoskeletal: Normal range of motion. She exhibits no edema and no tenderness.  Pelvis stable. 2+ distal pulses. Patient has exquisite tenderness in the upper lumbar spine region. No definite step offs. She has pain with range of motion of the right ankle. It is swollen compared to the left. Diffusely tender to palpation.   Neurological: She is alert and oriented to person, place, and time.  Moves all extremities without deficit though limited movement of the right lower extremity due to pain. Sensation intact.  Skin: Skin is warm and dry. No rash noted. No erythema.  Psychiatric: She has a normal mood and affect. Her behavior is normal.    ED Course  Procedures (including critical care time) DIAGNOSTIC STUDIES: Oxygen Saturation is 99% on RA, nl by my interpretation.        Labs Review Labs Reviewed  CBC WITH DIFFERENTIAL - Abnormal; Notable for the following:    RBC 3.78 (*)    Hemoglobin 11.0 (*)    HCT 32.8 (*)    All other components within normal limits  COMPREHENSIVE METABOLIC PANEL - Abnormal; Notable for the following:    Total Bilirubin 0.2 (*)    GFR calc non Af Amer 80 (*)    All other components within normal limits  PROTIME-INR  TROPONIN I  URINALYSIS, ROUTINE W REFLEX MICROSCOPIC  APTT  BASIC METABOLIC PANEL  CBC    Imaging Review Dg Lumbar Spine Complete  11/08/2013   CLINICAL DATA:  58 year old female status post fall with pain. Prior surgery. Initial encounter.  EXAM: LUMBAR SPINE - COMPLETE 4+ VIEW  COMPARISON:  CT Abdomen and Pelvis 09/01/2010.  Chest CT 09/14/2011.  FINDINGS: Sequelae of posterior and interbody fusion re- identified in the lower lumbar spine. Lumbar segmentation appears to be normal, the operated levels are L4-L5 and L5-S1. New L1 compression fracture since 2013, primarily with superior endplate deformity. Mild loss of height. No evidence of retropulsed bone. Other lumbar levels appear stable and intact. Grossly intact visualized lower thoracic levels. Sacral ala and SI joints appear grossly intact.  IMPRESSION: 1. L1 compression fracture new since 2013, suspicious for acute injury in this setting. If specific therapy such as vertebroplasty is desired, lumbar MRI or whole-body bone scan would best confirm acuity. 2. Otherwise stable postoperative appearance of  the lumbar spine.   Electronically Signed   By: Lezlie Octave.D.  On: 11/08/2013 01:42   Dg Pelvis 1-2 Views  11/08/2013   CLINICAL DATA:  Fall with back pain  EXAM: PELVIS - 1-2 VIEW  COMPARISON:  None.  FINDINGS: No evidence of pelvic ring fracture or diastasis. No evidence of hip fracture or dislocation. Irregularity of the bilateral anterior iliac wings consistent with harvest sites in this patient with lower lumbar fusions.  IMPRESSION: No acute osseous findings.   Electronically Signed   By: Jorje Guild M.D.   On: 11/08/2013 01:36   Dg Wrist Complete Right  11/08/2013   CLINICAL DATA:  Fall  EXAM: RIGHT WRIST - COMPLETE 3+ VIEW  COMPARISON:  None.  FINDINGS: There is no evidence of fracture or dislocation. There is no evidence of arthropathy or other focal bone abnormality. Soft tissues are unremarkable.  IMPRESSION: Negative.   Electronically Signed   By: Jeannine Boga M.D.   On: 11/08/2013 01:49   Dg Ankle Complete Right  11/08/2013   CLINICAL DATA:  Fall.  EXAM: RIGHT ANKLE - COMPLETE 3+ VIEW  COMPARISON:  07/01/2011  FINDINGS: Status post attempted tibiotalar arthrodesis with plate and lag screw traversing the joint. Progressive lucency around the hardware, with an anterior plate fracture that is new. No osseous erosion or periosteal reaction strongly suspicious for infection. There has also been at a subtalar arthrodesis which appears complete. Lateral calcaneus plate and screw fixation for remote injury. No acute osseous fracture. No dislocation.  IMPRESSION: 1. Failed ankle arthrodesis with ventral plate fracture that is new from 2013. 2. Complete subtalar arthrodesis. 3. No acute osseous fracture.   Electronically Signed   By: Jorje Guild M.D.   On: 11/08/2013 01:47     EKG Interpretation None      MDM   Final diagnoses:  Compression fracture of L1 lumbar vertebra, closed, initial encounter  Ankle fracture, right, closed, initial encounter   I personally performed  the services described in this documentation, which was scribed in my presence. The recorded information has been reviewed and is accurate.  Discussed with [facility] hospitalist will admit. Plan for transfer to [facility] for possible vertebroplasty. Patient required multiple doses of that pain medication to manage her symptoms.  Julianne Rice, MD 11/08/13 [phone]

## 2013-11-07 NOTE — ED Notes (Signed)
Bruising and swelling noted to right wrist.

## 2013-11-08 ENCOUNTER — Inpatient Hospital Stay (HOSPITAL_COMMUNITY): Payer: Medicare FFS

## 2013-11-08 ENCOUNTER — Emergency Department (HOSPITAL_COMMUNITY): Payer: Medicare FFS

## 2013-11-08 ENCOUNTER — Encounter (HOSPITAL_COMMUNITY): Payer: Self-pay | Admitting: *Deleted

## 2013-11-08 DIAGNOSIS — Z8249 Family history of ischemic heart disease and other diseases of the circulatory system: Secondary | ICD-10-CM | POA: Diagnosis not present

## 2013-11-08 DIAGNOSIS — G894 Chronic pain syndrome: Secondary | ICD-10-CM | POA: Diagnosis present

## 2013-11-08 DIAGNOSIS — F329 Major depressive disorder, single episode, unspecified: Secondary | ICD-10-CM | POA: Diagnosis present

## 2013-11-08 DIAGNOSIS — Z833 Family history of diabetes mellitus: Secondary | ICD-10-CM | POA: Diagnosis not present

## 2013-11-08 DIAGNOSIS — F3289 Other specified depressive episodes: Secondary | ICD-10-CM | POA: Diagnosis present

## 2013-11-08 DIAGNOSIS — J449 Chronic obstructive pulmonary disease, unspecified: Secondary | ICD-10-CM | POA: Diagnosis present

## 2013-11-08 DIAGNOSIS — W19XXXA Unspecified fall, initial encounter: Secondary | ICD-10-CM

## 2013-11-08 DIAGNOSIS — Z5189 Encounter for other specified aftercare: Secondary | ICD-10-CM

## 2013-11-08 DIAGNOSIS — F4310 Post-traumatic stress disorder, unspecified: Secondary | ICD-10-CM

## 2013-11-08 DIAGNOSIS — J383 Other diseases of vocal cords: Secondary | ICD-10-CM | POA: Diagnosis present

## 2013-11-08 DIAGNOSIS — M542 Cervicalgia: Secondary | ICD-10-CM | POA: Diagnosis present

## 2013-11-08 DIAGNOSIS — IMO0001 Reserved for inherently not codable concepts without codable children: Secondary | ICD-10-CM | POA: Diagnosis present

## 2013-11-08 DIAGNOSIS — M549 Dorsalgia, unspecified: Secondary | ICD-10-CM | POA: Diagnosis present

## 2013-11-08 DIAGNOSIS — K589 Irritable bowel syndrome without diarrhea: Secondary | ICD-10-CM | POA: Diagnosis present

## 2013-11-08 DIAGNOSIS — Z888 Allergy status to other drugs, medicaments and biological substances status: Secondary | ICD-10-CM | POA: Diagnosis not present

## 2013-11-08 DIAGNOSIS — Z91040 Latex allergy status: Secondary | ICD-10-CM | POA: Diagnosis not present

## 2013-11-08 DIAGNOSIS — Z880 Allergy status to penicillin: Secondary | ICD-10-CM | POA: Diagnosis not present

## 2013-11-08 DIAGNOSIS — Z7982 Long term (current) use of aspirin: Secondary | ICD-10-CM | POA: Diagnosis not present

## 2013-11-08 DIAGNOSIS — T85698A Other mechanical complication of other specified internal prosthetic devices, implants and grafts, initial encounter: Secondary | ICD-10-CM | POA: Diagnosis present

## 2013-11-08 DIAGNOSIS — E119 Type 2 diabetes mellitus without complications: Secondary | ICD-10-CM | POA: Diagnosis present

## 2013-11-08 DIAGNOSIS — W010XXA Fall on same level from slipping, tripping and stumbling without subsequent striking against object, initial encounter: Secondary | ICD-10-CM | POA: Diagnosis present

## 2013-11-08 DIAGNOSIS — Y92009 Unspecified place in unspecified non-institutional (private) residence as the place of occurrence of the external cause: Secondary | ICD-10-CM

## 2013-11-08 DIAGNOSIS — Z885 Allergy status to narcotic agent status: Secondary | ICD-10-CM | POA: Diagnosis not present

## 2013-11-08 DIAGNOSIS — S32009A Unspecified fracture of unspecified lumbar vertebra, initial encounter for closed fracture: Secondary | ICD-10-CM | POA: Diagnosis present

## 2013-11-08 DIAGNOSIS — D179 Benign lipomatous neoplasm, unspecified: Secondary | ICD-10-CM | POA: Insufficient documentation

## 2013-11-08 DIAGNOSIS — F411 Generalized anxiety disorder: Secondary | ICD-10-CM

## 2013-11-08 DIAGNOSIS — Z79899 Other long term (current) drug therapy: Secondary | ICD-10-CM | POA: Diagnosis not present

## 2013-11-08 DIAGNOSIS — G43909 Migraine, unspecified, not intractable, without status migrainosus: Secondary | ICD-10-CM | POA: Diagnosis present

## 2013-11-08 DIAGNOSIS — M1990 Unspecified osteoarthritis, unspecified site: Secondary | ICD-10-CM | POA: Diagnosis present

## 2013-11-08 DIAGNOSIS — Z87891 Personal history of nicotine dependence: Secondary | ICD-10-CM | POA: Diagnosis not present

## 2013-11-08 DIAGNOSIS — Z981 Arthrodesis status: Secondary | ICD-10-CM | POA: Diagnosis not present

## 2013-11-08 DIAGNOSIS — Y849 Medical procedure, unspecified as the cause of abnormal reaction of the patient, or of later complication, without mention of misadventure at the time of the procedure: Secondary | ICD-10-CM | POA: Diagnosis present

## 2013-11-08 DIAGNOSIS — R110 Nausea: Secondary | ICD-10-CM

## 2013-11-08 DIAGNOSIS — Z800 Family history of malignant neoplasm of digestive organs: Secondary | ICD-10-CM | POA: Diagnosis not present

## 2013-11-08 DIAGNOSIS — S32010A Wedge compression fracture of first lumbar vertebra, initial encounter for closed fracture: Secondary | ICD-10-CM | POA: Diagnosis present

## 2013-11-08 DIAGNOSIS — E039 Hypothyroidism, unspecified: Secondary | ICD-10-CM | POA: Diagnosis present

## 2013-11-08 LAB — CBC
HCT: 31.7 % — ABNORMAL LOW (ref 36.0–46.0)
Hemoglobin: 10.5 g/dL — ABNORMAL LOW (ref 12.0–15.0)
MCH: 28.6 pg (ref 26.0–34.0)
MCHC: 33.1 g/dL (ref 30.0–36.0)
MCV: 86.4 fL (ref 78.0–100.0)
PLATELETS: 149 10*3/uL — AB (ref 150–400)
RBC: 3.67 MIL/uL — AB (ref 3.87–5.11)
RDW: 13.2 % (ref 11.5–15.5)
WBC: 5.9 10*3/uL (ref 4.0–10.5)

## 2013-11-08 LAB — CBC WITH DIFFERENTIAL/PLATELET
Basophils Absolute: 0 10*3/uL (ref 0.0–0.1)
Basophils Relative: 0 % (ref 0–1)
EOS PCT: 2 % (ref 0–5)
Eosinophils Absolute: 0.2 10*3/uL (ref 0.0–0.7)
HEMATOCRIT: 32.8 % — AB (ref 36.0–46.0)
Hemoglobin: 11 g/dL — ABNORMAL LOW (ref 12.0–15.0)
LYMPHS PCT: 26 % (ref 12–46)
Lymphs Abs: 2.2 10*3/uL (ref 0.7–4.0)
MCH: 29.1 pg (ref 26.0–34.0)
MCHC: 33.5 g/dL (ref 30.0–36.0)
MCV: 86.8 fL (ref 78.0–100.0)
MONOS PCT: 6 % (ref 3–12)
Monocytes Absolute: 0.5 10*3/uL (ref 0.1–1.0)
Neutro Abs: 5.5 10*3/uL (ref 1.7–7.7)
Neutrophils Relative %: 66 % (ref 43–77)
Platelets: 181 10*3/uL (ref 150–400)
RBC: 3.78 MIL/uL — AB (ref 3.87–5.11)
RDW: 13.3 % (ref 11.5–15.5)
WBC: 8.5 10*3/uL (ref 4.0–10.5)

## 2013-11-08 LAB — COMPREHENSIVE METABOLIC PANEL
ALT: 13 U/L (ref 0–35)
AST: 20 U/L (ref 0–37)
Albumin: 3.8 g/dL (ref 3.5–5.2)
Alkaline Phosphatase: 69 U/L (ref 39–117)
Anion gap: 11 (ref 5–15)
BUN: 18 mg/dL (ref 6–23)
CO2: 30 meq/L (ref 19–32)
Calcium: 9.4 mg/dL (ref 8.4–10.5)
Chloride: 97 mEq/L (ref 96–112)
Creatinine, Ser: 0.8 mg/dL (ref 0.50–1.10)
GFR, EST NON AFRICAN AMERICAN: 80 mL/min — AB (ref >90)
Glucose, Bld: 80 mg/dL (ref 70–99)
Potassium: 4 mEq/L (ref 3.7–5.3)
SODIUM: 138 meq/L (ref 137–147)
Total Bilirubin: 0.2 mg/dL — ABNORMAL LOW (ref 0.3–1.2)
Total Protein: 7.3 g/dL (ref 6.0–8.3)

## 2013-11-08 LAB — URINALYSIS, ROUTINE W REFLEX MICROSCOPIC
BILIRUBIN URINE: NEGATIVE
Glucose, UA: NEGATIVE mg/dL
HGB URINE DIPSTICK: NEGATIVE
KETONES UR: NEGATIVE mg/dL
Leukocytes, UA: NEGATIVE
Nitrite: NEGATIVE
PH: 6 (ref 5.0–8.0)
Protein, ur: NEGATIVE mg/dL
SPECIFIC GRAVITY, URINE: 1.01 (ref 1.005–1.030)
UROBILINOGEN UA: 0.2 mg/dL (ref 0.0–1.0)

## 2013-11-08 LAB — PROTIME-INR
INR: 0.96 (ref 0.00–1.49)
Prothrombin Time: 12.8 seconds (ref 11.6–15.2)

## 2013-11-08 LAB — BASIC METABOLIC PANEL
Anion gap: 9 (ref 5–15)
BUN: 14 mg/dL (ref 6–23)
CO2: 29 mEq/L (ref 19–32)
Calcium: 9.4 mg/dL (ref 8.4–10.5)
Chloride: 101 mEq/L (ref 96–112)
Creatinine, Ser: 0.74 mg/dL (ref 0.50–1.10)
Glucose, Bld: 89 mg/dL (ref 70–99)
POTASSIUM: 4.2 meq/L (ref 3.7–5.3)
Sodium: 139 mEq/L (ref 137–147)

## 2013-11-08 LAB — GLUCOSE, CAPILLARY
Glucose-Capillary: 85 mg/dL (ref 70–99)
Glucose-Capillary: 85 mg/dL (ref 70–99)
Glucose-Capillary: 172 mg/dL — ABNORMAL HIGH (ref 70–99)

## 2013-11-08 LAB — TROPONIN I: Troponin I: <0.3 ng/mL (ref <0.30)

## 2013-11-08 LAB — TSH: TSH: 3.75 u[IU]/mL (ref 0.350–4.500)

## 2013-11-08 LAB — APTT: aPTT: 29 seconds (ref 24–37)

## 2013-11-08 MED ORDER — ONDANSETRON HCL 4 MG PO TABS
4.0000 mg | ORAL_TABLET | Freq: Four times a day (QID) | ORAL | Status: DC | PRN
  Administered 2013-11-08 – 2013-11-14 (×3): 4 mg via ORAL
  Filled 2013-11-08 (×4): qty 1

## 2013-11-08 MED ORDER — LEVOTHYROXINE SODIUM 112 MCG PO TABS
224.0000 ug | ORAL_TABLET | Freq: Every day | ORAL | Status: DC
  Administered 2013-11-08 – 2013-11-16 (×9): 224 ug via ORAL
  Filled 2013-11-08 (×13): qty 2

## 2013-11-08 MED ORDER — METHADONE HCL 10 MG PO TABS
10.0000 mg | ORAL_TABLET | Freq: Three times a day (TID) | ORAL | Status: DC
  Administered 2013-11-08 – 2013-11-15 (×20): 10 mg via ORAL
  Filled 2013-11-08 (×21): qty 1

## 2013-11-08 MED ORDER — IPRATROPIUM-ALBUTEROL 18-103 MCG/ACT IN AERO: 2.0000 | INHALATION_SPRAY | Freq: Three times a day (TID) | RESPIRATORY_TRACT | Status: DC

## 2013-11-08 MED ORDER — FLUTICASONE PROPIONATE 50 MCG/ACT NA SUSP
2.0000 | Freq: Every day | NASAL | Status: DC
  Administered 2013-11-10 – 2013-11-16 (×8): 2 via NASAL
  Filled 2013-11-08 (×2): qty 16

## 2013-11-08 MED ORDER — SODIUM CHLORIDE 0.9 % IV SOLN: INTRAVENOUS | Status: AC

## 2013-11-08 MED ORDER — INSULIN ASPART 100 UNIT/ML ~~LOC~~ SOLN: 0.0000 [IU] | Freq: Four times a day (QID) | SUBCUTANEOUS | Status: DC

## 2013-11-08 MED ORDER — SERTRALINE HCL 100 MG PO TABS
100.0000 mg | ORAL_TABLET | Freq: Every day | ORAL | Status: DC
  Administered 2013-11-08 – 2013-11-11 (×3): 100 mg via ORAL
  Filled 2013-11-08 (×5): qty 1

## 2013-11-08 MED ORDER — HYDROMORPHONE HCL PF 1 MG/ML IJ SOLN
2.0000 mg | INTRAMUSCULAR | Status: DC | PRN
  Administered 2013-11-08 – 2013-11-09 (×8): 2 mg via INTRAVENOUS
  Administered 2013-11-09: 1 mg via INTRAVENOUS
  Administered 2013-11-09 – 2013-11-12 (×17): 2 mg via INTRAVENOUS
  Filled 2013-11-08 (×26): qty 2

## 2013-11-08 MED ORDER — LORAZEPAM 2 MG/ML IJ SOLN
0.5000 mg | Freq: Once | INTRAMUSCULAR | Status: AC
  Administered 2013-11-08: 0.5 mg via INTRAVENOUS
  Filled 2013-11-08: qty 1

## 2013-11-08 MED ORDER — IPRATROPIUM-ALBUTEROL 0.5-2.5 (3) MG/3ML IN SOLN
3.0000 mL | Freq: Three times a day (TID) | RESPIRATORY_TRACT | Status: DC
  Administered 2013-11-08 – 2013-11-09 (×4): 3 mL via RESPIRATORY_TRACT
  Filled 2013-11-08 (×5): qty 3

## 2013-11-08 MED ORDER — ONDANSETRON HCL 4 MG/2ML IJ SOLN
4.0000 mg | Freq: Four times a day (QID) | INTRAMUSCULAR | Status: DC | PRN
  Administered 2013-11-08 – 2013-11-11 (×5): 4 mg via INTRAVENOUS
  Filled 2013-11-08 (×4): qty 2

## 2013-11-08 MED ORDER — POLYETHYLENE GLYCOL 3350 17 G PO PACK
17.0000 g | PACK | Freq: Every day | ORAL | Status: DC | PRN
  Filled 2013-11-08: qty 1

## 2013-11-08 MED ORDER — INSULIN ASPART 100 UNIT/ML ~~LOC~~ SOLN
0.0000 [IU] | Freq: Three times a day (TID) | SUBCUTANEOUS | Status: DC
  Administered 2013-11-10: 2 [IU] via SUBCUTANEOUS

## 2013-11-08 MED ORDER — HYDROMORPHONE HCL PF 1 MG/ML IJ SOLN
1.0000 mg | Freq: Once | INTRAMUSCULAR | Status: AC
  Administered 2013-11-08: 1 mg via INTRAVENOUS
  Filled 2013-11-08: qty 1

## 2013-11-08 MED ORDER — HYDROMORPHONE HCL PF 1 MG/ML IJ SOLN
1.0000 mg | Freq: Once | INTRAMUSCULAR | Status: AC
Start: 2013-11-08 — End: 2013-11-08
  Administered 2013-11-08: 1 mg via INTRAVENOUS
  Filled 2013-11-08: qty 1

## 2013-11-08 MED ORDER — MORPHINE SULFATE 4 MG/ML IJ SOLN
4.0000 mg | Freq: Once | INTRAMUSCULAR | Status: AC
  Administered 2013-11-08: 4 mg via INTRAVENOUS
  Filled 2013-11-08: qty 1

## 2013-11-08 MED ORDER — METHOCARBAMOL 1000 MG/10ML IJ SOLN
500.0000 mg | Freq: Four times a day (QID) | INTRAVENOUS | Status: DC | PRN
  Administered 2013-11-10 – 2013-11-16 (×7): 500 mg via INTRAVENOUS
  Filled 2013-11-08 (×14): qty 5

## 2013-11-08 MED ORDER — SODIUM CHLORIDE 0.9 % IV SOLN
INTRAVENOUS | Status: AC
  Administered 2013-11-08: 1 mL/h via INTRAVENOUS

## 2013-11-08 MED ORDER — KETOROLAC TROMETHAMINE 30 MG/ML IJ SOLN
30.0000 mg | Freq: Once | INTRAMUSCULAR | Status: AC
  Administered 2013-11-08: 30 mg via INTRAVENOUS
  Filled 2013-11-08: qty 1

## 2013-11-08 MED ORDER — POLYETHYLENE GLYCOL 3350 17 G PO PACK: 25.0000 g | PACK | Freq: Every day | ORAL | Status: DC | PRN

## 2013-11-08 NOTE — Assessment & Plan Note (Signed)
Chronic moderately severe anxiety, managed by psych

## 2013-11-08 NOTE — Assessment & Plan Note (Signed)
Controlled, no change in medication Managed by psych 

## 2013-11-08 NOTE — Assessment & Plan Note (Signed)
UA is negative, trial of med for incontinence , however, pt reports feeling of incomplete emptying a;so I believe that she needs urology eval , however , opting to defer the referral at this time, and understand the need to call back for referral if she changes her mind

## 2013-11-08 NOTE — Progress Notes (Addendum)
Patient seen and examined Agree with Phillips Grout, MD history and physical  Compression fracture of L1 Patient's pain seems to be uncontrolled Consulted interventional radiology The recommended MRI lumbar prior to considering vertebroplasty vs kyphoplasty We'll keep patient n.p.o. until radiology decides if they would do a procedure today  Right ankle ventral plate fracture  Previous History of orthopedic surgery at [facility] Dr. [name] to see the patient   Chronic pain syndrome Patient currently on methadone, IV Dilaudid, IV Robaxin  Type 2 diabetes Hemoglobin A1c on 8/19 was 5.5 Continue sliding scale and an  Hypothyroidism continue Synthroid, will check a TSH  PT OT evaluation

## 2013-11-08 NOTE — Consult Note (Signed)
Orthopaedic Trauma Service Consultation  Reason for Consult: Right ankle and wrist pain s/p fall Referring Physician: Dr. Allyson Sabal, Hospitalist  [name] Fitzpatrick is an 58 y.o. female.  HPI: Chronic pain patient with multiple prior ankle surgeries on the right fell at home with resultant spine, right wrist, right ankle pain that rendered her unable to ambulate or to sit upright without significant pain.  Currently without associated paresthesias, throbbing, deep, exacerbated with movement or sitting upright or lying totally flat.  Denies B/B sxs.  Last surgery on right ankle was two years ago by Dr. Francee Gentile at [facility], with several prior procedures including ORIF, subtalar fusion, and ankle arthrodesis using proximal tibial autograft.  CT of spine and MRI are pending.  Past Medical History  Diagnosis Date  . Anxiety   . Depression   . Hypothyroidism   . Osteoarthritis   . IBS (irritable bowel syndrome)     with constipation  . Fibromyalgia   . Chronic LBP   . Migraine headache   . Chronic neck pain   . DM type 2 (diabetes mellitus, type 2)   . Hypertension   . COPD (chronic obstructive pulmonary disease)   . H/O suicide attempt     Past Surgical History  Procedure Laterality Date  . Reconstucted right ankle    . C-spine fusion  1991, 2004  . Vesicovaginal fistula closure w/ tah  1993    Fibroids no Ca  . Right knee arthroscopic surgery    . B/l foot surgery --bunions    . L carpal tunnel release    . L spine fusion x 2    . Total reconstruction of right ankle and heel      total of three   . Abdominal hysterectomy    . Cholecystectomy      Family History  Problem Relation Age of Onset  . Cancer Father     pancreatic   . Pancreatic cancer Father   . Heart failure Mother   . GI problems Mother   . Heart disease Mother   . Diabetes Mother   . Thyroid disease Sister   . Cancer Maternal Grandfather   . Cancer      family history     Social History:  reports  that she has quit smoking. Her smoking use included Cigarettes. She has a 12 pack-year smoking history. She does not have any smokeless tobacco history on file. She reports that she uses illicit drugs. She reports that she does not drink alcohol.  Allergies:  Allergies  Allergen Reactions  . Prednisone Shortness Of Breath and Nausea And Vomiting  . Penicillins Other (See Comments)    Pt states she almost died after taking the following. She states that she had Stroke like symptoms post taking the medication  . Cephalexin Hives, Swelling and Rash  . Latex Rash and Other (See Comments)    Causes skin to tear easily  . Metoclopramide Hcl Hives and Rash    Medications: I have reviewed the patient's current medications.  Results for orders placed during the hospital encounter of 11/07/13 (from the past 48 hour(s))  CBC WITH DIFFERENTIAL     Status: Abnormal   Collection Time    11/08/13 12:20 AM      Result Value Ref Range   WBC 8.5  4.0 - 10.5 K/uL   RBC 3.78 (*) 3.87 - 5.11 MIL/uL   Hemoglobin 11.0 (*) 12.0 - 15.0 g/dL   HCT 32.8 (*) 36.0 -  46.0 %   MCV 86.8  78.0 - 100.0 fL   MCH 29.1  26.0 - 34.0 pg   MCHC 33.5  30.0 - 36.0 g/dL   RDW 13.3  11.5 - 15.5 %   Platelets 181  150 - 400 K/uL   Neutrophils Relative % 66  43 - 77 %   Neutro Abs 5.5  1.7 - 7.7 K/uL   Lymphocytes Relative 26  12 - 46 %   Lymphs Abs 2.2  0.7 - 4.0 K/uL   Monocytes Relative 6  3 - 12 %   Monocytes Absolute 0.5  0.1 - 1.0 K/uL   Eosinophils Relative 2  0 - 5 %   Eosinophils Absolute 0.2  0.0 - 0.7 K/uL   Basophils Relative 0  0 - 1 %   Basophils Absolute 0.0  0.0 - 0.1 K/uL  COMPREHENSIVE METABOLIC PANEL     Status: Abnormal   Collection Time    11/08/13 12:20 AM      Result Value Ref Range   Sodium 138  137 - 147 mEq/L   Potassium 4.0  3.7 - 5.3 mEq/L   Chloride 97  96 - 112 mEq/L   CO2 30  19 - 32 mEq/L   Glucose, Bld 80  70 - 99 mg/dL   BUN 18  6 - 23 mg/dL   Creatinine, Ser 0.80  0.50 - 1.10  mg/dL   Calcium 9.4  8.4 - 10.5 mg/dL   Total Protein 7.3  6.0 - 8.3 g/dL   Albumin 3.8  3.5 - 5.2 g/dL   AST 20  0 - 37 U/L   ALT 13  0 - 35 U/L   Alkaline Phosphatase 69  39 - 117 U/L   Total Bilirubin 0.2 (*) 0.3 - 1.2 mg/dL   GFR calc non Af Amer 80 (*) >90 mL/min   GFR calc Af Amer >90  >90 mL/min   Comment: (NOTE)     The eGFR has been calculated using the CKD EPI equation.     This calculation has not been validated in all clinical situations.     eGFR's persistently <90 mL/min signify possible Chronic Kidney     Disease.   Anion gap 11  5 - 15  PROTIME-INR     Status: None   Collection Time    11/08/13 12:20 AM      Result Value Ref Range   Prothrombin Time 12.8  11.6 - 15.2 seconds   INR 0.96  0.00 - 1.49  TROPONIN I     Status: None   Collection Time    11/08/13 12:20 AM      Result Value Ref Range   Troponin I <0.30  <0.30 ng/mL   Comment:            Due to the release kinetics of cTnI,     a negative result within the first hours     of the onset of symptoms does not rule out     myocardial infarction with certainty.     If myocardial infarction is still suspected,     repeat the test at appropriate intervals.  APTT     Status: None   Collection Time    11/08/13 12:20 AM      Result Value Ref Range   aPTT 29  24 - 37 seconds  URINALYSIS, ROUTINE W REFLEX MICROSCOPIC     Status: None   Collection Time    11/08/13  2:37   AM      Result Value Ref Range   Color, Urine YELLOW  YELLOW   APPearance CLEAR  CLEAR   Specific Gravity, Urine 1.010  1.005 - 1.030   pH 6.0  5.0 - 8.0   Glucose, UA NEGATIVE  NEGATIVE mg/dL   Hgb urine dipstick NEGATIVE  NEGATIVE   Bilirubin Urine NEGATIVE  NEGATIVE   Ketones, ur NEGATIVE  NEGATIVE mg/dL   Protein, ur NEGATIVE  NEGATIVE mg/dL   Urobilinogen, UA 0.2  0.0 - 1.0 mg/dL   Nitrite NEGATIVE  NEGATIVE   Leukocytes, UA NEGATIVE  NEGATIVE   Comment: MICROSCOPIC NOT DONE ON URINES WITH NEGATIVE PROTEIN, BLOOD, LEUKOCYTES,  NITRITE, OR GLUCOSE <1000 mg/dL.  BASIC METABOLIC PANEL     Status: None   Collection Time    11/08/13  6:34 AM      Result Value Ref Range   Sodium 139  137 - 147 mEq/L   Potassium 4.2  3.7 - 5.3 mEq/L   Chloride 101  96 - 112 mEq/L   CO2 29  19 - 32 mEq/L   Glucose, Bld 89  70 - 99 mg/dL   BUN 14  6 - 23 mg/dL   Creatinine, Ser 0.74  0.50 - 1.10 mg/dL   Calcium 9.4  8.4 - 10.5 mg/dL   GFR calc non Af Amer >90  >90 mL/min   GFR calc Af Amer >90  >90 mL/min   Comment: (NOTE)     The eGFR has been calculated using the CKD EPI equation.     This calculation has not been validated in all clinical situations.     eGFR's persistently <90 mL/min signify possible Chronic Kidney     Disease.   Anion gap 9  5 - 15  CBC     Status: Abnormal   Collection Time    11/08/13  6:34 AM      Result Value Ref Range   WBC 5.9  4.0 - 10.5 K/uL   RBC 3.67 (*) 3.87 - 5.11 MIL/uL   Hemoglobin 10.5 (*) 12.0 - 15.0 g/dL   HCT 31.7 (*) 36.0 - 46.0 %   MCV 86.4  78.0 - 100.0 fL   MCH 28.6  26.0 - 34.0 pg   MCHC 33.1  30.0 - 36.0 g/dL   RDW 13.2  11.5 - 15.5 %   Platelets 149 (*) 150 - 400 K/uL    Dg Lumbar Spine Complete  11/08/2013   CLINICAL DATA:  58 year old female status post fall with pain. Prior surgery. Initial encounter.  EXAM: LUMBAR SPINE - COMPLETE 4+ VIEW  COMPARISON:  CT Abdomen and Pelvis 09/01/2010.  Chest CT 09/14/2011.  FINDINGS: Sequelae of posterior and interbody fusion re- identified in the lower lumbar spine. Lumbar segmentation appears to be normal, the operated levels are L4-L5 and L5-S1. New L1 compression fracture since 2013, primarily with superior endplate deformity. Mild loss of height. No evidence of retropulsed bone. Other lumbar levels appear stable and intact. Grossly intact visualized lower thoracic levels. Sacral ala and SI joints appear grossly intact.  IMPRESSION: 1. L1 compression fracture new since 2013, suspicious for acute injury in this setting. If specific therapy  such as vertebroplasty is desired, lumbar MRI or whole-body bone scan would best confirm acuity. 2. Otherwise stable postoperative appearance of the lumbar spine.   Electronically Signed   By: Lars Pinks M.D.   On: 11/08/2013 01:42   Dg Pelvis 1-2 Views  11/08/2013   CLINICAL DATA:  Fall with back pain  EXAM: PELVIS - 1-2 VIEW  COMPARISON:  None.  FINDINGS: No evidence of pelvic ring fracture or diastasis. No evidence of hip fracture or dislocation. Irregularity of the bilateral anterior iliac wings consistent with harvest sites in this patient with lower lumbar fusions.  IMPRESSION: No acute osseous findings.   Electronically Signed   By: Jonathan  Watts M.D.   On: 11/08/2013 01:36   Dg Wrist Complete Right  11/08/2013   CLINICAL DATA:  Fall  EXAM: RIGHT WRIST - COMPLETE 3+ VIEW  COMPARISON:  None.  FINDINGS: There is no evidence of fracture or dislocation. There is no evidence of arthropathy or other focal bone abnormality. Soft tissues are unremarkable.  IMPRESSION: Negative.   Electronically Signed   By: Benjamin  McClintock M.D.   On: 11/08/2013 01:49   Dg Ankle Complete Right  11/08/2013   CLINICAL DATA:  Fall.  EXAM: RIGHT ANKLE - COMPLETE 3+ VIEW  COMPARISON:  07/01/2011  FINDINGS: Status post attempted tibiotalar arthrodesis with plate and lag screw traversing the joint. Progressive lucency around the hardware, with an anterior plate fracture that is new. No osseous erosion or periosteal reaction strongly suspicious for infection. There has also been at a subtalar arthrodesis which appears complete. Lateral calcaneus plate and screw fixation for remote injury. No acute osseous fracture. No dislocation.  IMPRESSION: 1. Failed ankle arthrodesis with ventral plate fracture that is new from 2013. 2. Complete subtalar arthrodesis. 3. No acute osseous fracture.   Electronically Signed   By: Jonathan  Watts M.D.   On: 11/08/2013 01:47    ROS No recent fever, bleeding abnormalities, urologic dysfunction,  GI problems, or weight gain.  Blood pressure 100/50, pulse 70, temperature 98.1 F (36.7 C), temperature source Oral, resp. rate 16, height 5' 2" (1.575 m), weight 211 lb 8 oz (95.936 kg), SpO2 96.00%. Physical Exam In mild to moderate distress secondary to pain NCAT LUEx shoulder, elbow, wrist, digits- no skin wounds, nontender, no instability, no blocks to motion  Sens  Ax/R/M/U intact  Mot   Ax/ R/ PIN/ M/ AIN/ U intact  Rad 2+ RUEx shoulder, elbow, digits- no skin wounds, nontender, no instability, no blocks to motion  Ulnar side of wrist with swelling, tenderness, and ecchymosis, full motion; pain with resisted ulnar deviation  Sens  Ax/R/M/U intact  Mot   Ax/ R/ PIN/ M/ AIN/ U intact  Rad 2+ LLE No traumatic wounds, ecchymosis, or rash  Nontender  No effusions  Knee stable to varus/ valgus and anterior/posterior stress  Sens DPN, SPN, TN intact  Motor EHL, ext, flex, evers 5/5  DP 2+, PT 2+, No significant edema RLE No traumatic wounds, ecchymosis, or rash  Tender ankle with chronic swelling, no warmth, no ankle motion--painful with attempt  No effusion of knee  Knee stable to varus/ valgus and anterior/posterior stress  Sens DPN, SPN, TN intact  Able to flex and extend great and lesser toes  DP 2+, PT 2+, No significant edema   Assessment/Plan: 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  Well fused subtalar joint. 1. OK for patient to mobilize with WBAT on the right ankle in a CAM boot. 2. F/u as outpatient with [facility] for elective revision of arthrodesis 3. Will follow while in house 4. Would recommend primary consult of NSGY and CT scan of lumbar fracture before proceeding with involvement of Radiology Service.  TL junction film would help to better eval amount of   height loss, but if getting CT could defer this, and would get CT prior to considering MRI. 5. May add right wrist Futura to assist with WBAT on  walker, determine based on clinical benefit with regard to pain; do not wear when not WB  Altamese Copeland, MD [facility] [phone] (p)   11/08/2013  11:43 AM

## 2013-11-08 NOTE — Assessment & Plan Note (Signed)
Intermittent vertigo, antivert ,as needed

## 2013-11-08 NOTE — Assessment & Plan Note (Signed)
Chronic pain management through [facility]

## 2013-11-08 NOTE — Assessment & Plan Note (Signed)
Recommend she have surgeon seeing her in the near future re thyroid , to also evaluate  The nodules , which in ,my opinion are  lipomas,

## 2013-11-08 NOTE — Assessment & Plan Note (Signed)
Patient educated about the importance of limiting  Carbohydrate intake , the need to commit to daily physical activity for a minimum of 30 minutes , and to commit weight loss. The fact that changes in all these areas will reduce or eliminate all together the development of diabetes is stressed.   Updated lab on day of visit is normal

## 2013-11-08 NOTE — H&P (Signed)
PCP:   [name], MD   Chief Complaint:  Back pain  HPI: 58 yo female h/o chronic neck pain on methadone, fibromyalgia, copd, dm, htn comes in after tripping and falling on her husbands shoes tonight.  She fell on her right side and hurt both her rt ankle and her lower back.  Was in normal state of health prior to this fall.  No head injury.  Found to have Lumber compression fracture and question of problem with her right ankle which has been reconstructed in the past.  She has received several doses of morphine and dilaudid in ED and is still having significant pain.  Review of Systems:  Positive and negative as per HPI otherwise all other systems are negative  Past Medical History: Past Medical History  Diagnosis Date  . Anxiety   . Depression   . Hypothyroidism   . Osteoarthritis   . IBS (irritable bowel syndrome)     with constipation  . Fibromyalgia   . Chronic LBP   . Migraine headache   . Chronic neck pain   . DM type 2 (diabetes mellitus, type 2)   . Hypertension   . COPD (chronic obstructive pulmonary disease)   . H/O suicide attempt    Past Surgical History  Procedure Laterality Date  . Reconstucted right ankle    . C-spine fusion  1991, 2004  . Vesicovaginal fistula closure w/ tah  1993    Fibroids no Ca  . Right knee arthroscopic surgery    . B/l foot surgery --bunions    . L carpal tunnel release    . L spine fusion x 2    . Total reconstruction of right ankle and heel      total of three   . Abdominal hysterectomy    . Cholecystectomy      Medications: Prior to Admission medications   Medication Sig Start Date End Date Taking? Authorizing Provider  albuterol-ipratropium (COMBIVENT) 18-103 MCG/ACT inhaler Inhale 2 puffs into the lungs 3 (three) times daily. For shortness of breath.  Take two puffs by mouth three times a day 07/13/11   Encarnacion Slates, NP  aspirin (ASPIRIN LOW DOSE) 81 MG EC tablet Take 1 tablet (81 mg total) by mouth at bedtime.  For mild pain / blood thinner   Take one tablet by mouth daily 07/13/11   Encarnacion Slates, NP  dicyclomine (BENTYL) 10 MG capsule TAKE 1 CAPSULE BY MOUTH TWICE DAILY.    Fayrene Helper, MD  estradiol (ESTRACE VAGINAL) 0.1 MG/GM vaginal cream Place 9.93 Applicatorfuls vaginally daily. 12/02/12   Florian Buff, MD  fluticasone (FLONASE) 50 MCG/ACT nasal spray Place 2 sprays into both nostrils daily. 05/27/13   Fayrene Helper, MD  furosemide (LASIX) 20 MG tablet Take 1 tablet (20 mg total) by mouth 2 (two) times daily. 03/20/12 11/04/13  Fayrene Helper, MD  levothyroxine (SYNTHROID, LEVOTHROID) 112 MCG tablet TAKE 2 TABLETS DAILY BEFORE BREAKFAST. 10/19/13   Fayrene Helper, MD  lidocaine (LIDODERM) 5 % Place 1 patch onto the skin daily.  10/21/12   Historical Provider, MD  meclizine (ANTIVERT) 12.5 MG tablet Take 1 tablet (12.5 mg total) by mouth 3 (three) times daily as needed for dizziness. 05/27/13   Fayrene Helper, MD  methadone (DOLOPHINE) 10 MG tablet Take 10 mg by mouth 3 (three) times daily.    Historical Provider, MD  ondansetron (ZOFRAN) 4 MG tablet Take 1 tablet (4 mg total) by mouth  2 (two) times daily as needed for nausea or vomiting. 11/04/13   Fayrene Helper, MD  peg 3350 powder (MOVIPREP) 100 G SOLR Take 1 kit (100 g total) by mouth once. 04/28/12   Butch Penny, NP  polyethylene glycol (MIRALAX / GLYCOLAX) packet Take 25 g by mouth daily as needed. For constipation   Dissolved in water or juice as needed for constipation 11/04/13   Fayrene Helper, MD  potassium chloride SA (K-DUR,KLOR-CON) 20 MEQ tablet Take 1 tablet (20 mEq total) by mouth 2 (two) times daily. 03/20/12   Fayrene Helper, MD  promethazine-dextromethorphan (PROMETHAZINE-DM) 6.25-15 MG/5ML syrup One teaspoon two  times daily ,as needed,for excessive cough 01/15/13   Fayrene Helper, MD  sertraline (ZOLOFT) 100 MG tablet Take 100 mg by mouth daily.    Historical Provider, MD  solifenacin (VESICARE)  10 MG tablet Take 0.5 tablets (5 mg total) by mouth daily. 11/04/13   Fayrene Helper, MD  traZODone (DESYREL) 150 MG tablet Take 1 tablet (150 mg total) by mouth at bedtime as needed. For sleep 07/13/11   Encarnacion Slates, NP    Allergies:   Allergies  Allergen Reactions  . Prednisone Shortness Of Breath and Nausea And Vomiting  . Penicillins Other (See Comments)    Pt states she almost died after taking the following. She states that she had Stroke like symptoms post taking the medication  . Cephalexin Hives, Swelling and Rash  . Latex Rash and Other (See Comments)    Causes skin to tear easily  . Metoclopramide Hcl Hives and Rash    Social History:  reports that she has quit smoking. Her smoking use included Cigarettes. She has a 12 pack-year smoking history. She does not have any smokeless tobacco history on file. She reports that she uses illicit drugs. She reports that she does not drink alcohol.  Family History: Family History  Problem Relation Age of Onset  . Cancer Father     pancreatic   . Pancreatic cancer Father   . Heart failure Mother   . GI problems Mother   . Heart disease Mother   . Diabetes Mother   . Thyroid disease Sister   . Cancer Maternal Grandfather   . Cancer      family history     Physical Exam: Filed Vitals:   11/07/13 2236 11/08/13 0216  BP: 109/58 102/65  Pulse: 72 74  Temp: 99 F (37.2 C)   TempSrc: Oral   Resp: 20 15  Height: '5\' 2"'  (1.575 m)   Weight: 94.348 kg (208 lb)   SpO2: 99% 94%   General appearance: alert, cooperative and mild distress Head: Normocephalic, without obvious abnormality, atraumatic Eyes: negative Nose: Nares normal. Septum midline. Mucosa normal. No drainage or sinus tenderness. Neck: no JVD and supple, symmetrical, trachea midline Lungs: clear to auscultation bilaterally Heart: regular rate and rhythm, S1, S2 normal, no murmur, click, rub or gallop Abdomen: soft, non-tender; bowel sounds normal; no masses,   no organomegaly Extremities: extremities normal, atraumatic, no cyanosis or edema  Rt ankle swollen and painful.  Lower back painful with palp Pulses: 2+ and symmetric Skin: Skin color, texture, turgor normal. No rashes or lesions Neurologic: Grossly normal    Labs on Admission:   Recent Labs  11/08/13 0020  NA 138  K 4.0  CL 97  CO2 30  GLUCOSE 80  BUN 18  CREATININE 0.80  CALCIUM 9.4    Recent Labs  11/08/13 0020  AST 20  ALT 13  ALKPHOS 69  BILITOT 0.2*  PROT 7.3  ALBUMIN 3.8    Recent Labs  11/08/13 0020  WBC 8.5  NEUTROABS 5.5  HGB 11.0*  HCT 32.8*  MCV 86.8  PLT 181    Recent Labs  11/08/13 0020  TROPONINI <0.30   Radiological Exams on Admission: Dg Lumbar Spine Complete  11/08/2013   CLINICAL DATA:  58 year old female status post fall with pain. Prior surgery. Initial encounter.  EXAM: LUMBAR SPINE - COMPLETE 4+ VIEW  COMPARISON:  CT Abdomen and Pelvis 09/01/2010.  Chest CT 09/14/2011.  FINDINGS: Sequelae of posterior and interbody fusion re- identified in the lower lumbar spine. Lumbar segmentation appears to be normal, the operated levels are L4-L5 and L5-S1. New L1 compression fracture since 2013, primarily with superior endplate deformity. Mild loss of height. No evidence of retropulsed bone. Other lumbar levels appear stable and intact. Grossly intact visualized lower thoracic levels. Sacral ala and SI joints appear grossly intact.  IMPRESSION: 1. L1 compression fracture new since 2013, suspicious for acute injury in this setting. If specific therapy such as vertebroplasty is desired, lumbar MRI or whole-body bone scan would best confirm acuity. 2. Otherwise stable postoperative appearance of the lumbar spine.   Electronically Signed   By: Lars Pinks M.D.   On: 11/08/2013 01:42   Dg Pelvis 1-2 Views  11/08/2013   CLINICAL DATA:  Fall with back pain  EXAM: PELVIS - 1-2 VIEW  COMPARISON:  None.  FINDINGS: No evidence of pelvic ring fracture or  diastasis. No evidence of hip fracture or dislocation. Irregularity of the bilateral anterior iliac wings consistent with harvest sites in this patient with lower lumbar fusions.  IMPRESSION: No acute osseous findings.   Electronically Signed   By: Jorje Guild M.D.   On: 11/08/2013 01:36   Dg Wrist Complete Right  11/08/2013   CLINICAL DATA:  Fall  EXAM: RIGHT WRIST - COMPLETE 3+ VIEW  COMPARISON:  None.  FINDINGS: There is no evidence of fracture or dislocation. There is no evidence of arthropathy or other focal bone abnormality. Soft tissues are unremarkable.  IMPRESSION: Negative.   Electronically Signed   By: Jeannine Boga M.D.   On: 11/08/2013 01:49   Dg Ankle Complete Right  11/08/2013   CLINICAL DATA:  Fall.  EXAM: RIGHT ANKLE - COMPLETE 3+ VIEW  COMPARISON:  07/01/2011  FINDINGS: Status post attempted tibiotalar arthrodesis with plate and lag screw traversing the joint. Progressive lucency around the hardware, with an anterior plate fracture that is new. No osseous erosion or periosteal reaction strongly suspicious for infection. There has also been at a subtalar arthrodesis which appears complete. Lateral calcaneus plate and screw fixation for remote injury. No acute osseous fracture. No dislocation.  IMPRESSION: 1. Failed ankle arthrodesis with ventral plate fracture that is new from 2013. 2. Complete subtalar arthrodesis. 3. No acute osseous fracture.   Electronically Signed   By: Jorje Guild M.D.   On: 11/08/2013 01:47    Assessment/Plan  58 yo female with mechanical fall and new L1 compression fracture and right ankle injury  Principal Problem:   Compression fracture of L1 lumbar vertebra-  [facility] to [facility] for possible vertobroplasty by IR.  Will need to call IR in am for evaluation.  Provide iv dilaudid and zofran for pain.  May need also ortho evaluation for her ankle.  Suspect pain control will be difficult. Cont her methadone.   Active Problems:  Stable unless o/w  noted   NECK PAIN, CHRONIC   COPD (chronic obstructive pulmonary disease)   Post traumatic stress disorder (PTSD)   Back pain, acute   Fall at home  Need to clarify home med regimen.  Transfer to [facility] surg bed.  Full code.  Lacey Dotson A 11/08/2013, 3:49 AM

## 2013-11-08 NOTE — Progress Notes (Signed)
Orthopedic Tech Progress Note Patient Details:  [name] Fitzpatrick 1955/06/09 [id number]  Ortho Devices Type of Ortho Device: CAM walker;Wrist splint Ortho Device/Splint Interventions: Application   Irish Elders 11/08/2013, 12:32 PM

## 2013-11-08 NOTE — Progress Notes (Signed)
Patient ID: [name] Fitzpatrick, female   DOB: [birth year], 58 y.o.   MRN: [id number]   Request rec'd for Lumbar 1 vertebroplasty vs kyphoplasty  Must have MRI for full examination  Determine acuteness of fracture Evaluate for edema  MRI Lumbar T10-S1 has been ordered today  Radiologist will review in am

## 2013-11-08 NOTE — ED Notes (Signed)
Pt ambulated using cane to bathroom with one-person assist.

## 2013-11-08 NOTE — Assessment & Plan Note (Signed)
Discussed role of lung cancer screening with pt with long smoking history and bnormal chest scan She is to further address with her pulmonolgist at next visit

## 2013-11-08 NOTE — Assessment & Plan Note (Signed)
Intermittent nausea, zofran prescribed in limited amount

## 2013-11-09 ENCOUNTER — Inpatient Hospital Stay (HOSPITAL_COMMUNITY): Payer: Medicare FFS

## 2013-11-09 DIAGNOSIS — S82899A Other fracture of unspecified lower leg, initial encounter for closed fracture: Secondary | ICD-10-CM

## 2013-11-09 DIAGNOSIS — J9600 Acute respiratory failure, unspecified whether with hypoxia or hypercapnia: Secondary | ICD-10-CM

## 2013-11-09 DIAGNOSIS — J438 Other emphysema: Secondary | ICD-10-CM

## 2013-11-09 DIAGNOSIS — S32009A Unspecified fracture of unspecified lumbar vertebra, initial encounter for closed fracture: Principal | ICD-10-CM

## 2013-11-09 DIAGNOSIS — J383 Other diseases of vocal cords: Secondary | ICD-10-CM

## 2013-11-09 LAB — POCT I-STAT 3, ART BLOOD GAS (G3+)
Acid-Base Excess: 10 mmol/L — ABNORMAL HIGH (ref 0.0–2.0)
BICARBONATE: 31.6 meq/L — AB (ref 20.0–24.0)
O2 Saturation: 100 %
PATIENT TEMPERATURE: 98.7
PCO2 ART: 33.2 mmHg — AB (ref 35.0–45.0)
TCO2: 33 mmol/L (ref 0–100)
pH, Arterial: 7.587 — ABNORMAL HIGH (ref 7.350–7.450)
pO2, Arterial: 184 mmHg — ABNORMAL HIGH (ref 80.0–100.0)

## 2013-11-09 LAB — GLUCOSE, CAPILLARY
GLUCOSE-CAPILLARY: 96 mg/dL (ref 70–99)
GLUCOSE-CAPILLARY: 95 mg/dL (ref 70–99)
GLUCOSE-CAPILLARY: 168 mg/dL — AB (ref 70–99)
Glucose-Capillary: 91 mg/dL (ref 70–99)
Glucose-Capillary: 100 mg/dL — ABNORMAL HIGH (ref 70–99)

## 2013-11-09 LAB — TROPONIN I

## 2013-11-09 MED ORDER — METHYLPREDNISOLONE SODIUM SUCC 125 MG IJ SOLR
60.0000 mg | Freq: Two times a day (BID) | INTRAMUSCULAR | Status: DC
  Administered 2013-11-09 – 2013-11-10 (×2): 60 mg via INTRAVENOUS
  Filled 2013-11-09 (×4): qty 0.96
  Filled 2013-11-09: qty 2

## 2013-11-09 MED ORDER — LORAZEPAM 2 MG/ML IJ SOLN
0.5000 mg | Freq: Once | INTRAMUSCULAR | Status: AC
  Administered 2013-11-09: 0.5 mg via INTRAVENOUS

## 2013-11-09 MED ORDER — IPRATROPIUM-ALBUTEROL 0.5-2.5 (3) MG/3ML IN SOLN
3.0000 mL | Freq: Once | RESPIRATORY_TRACT | Status: AC
  Administered 2013-11-09: 3 mL via RESPIRATORY_TRACT

## 2013-11-09 MED ORDER — LORAZEPAM 2 MG/ML IJ SOLN
INTRAMUSCULAR | Status: AC
  Filled 2013-11-09: qty 1

## 2013-11-09 MED ORDER — FUROSEMIDE 10 MG/ML IJ SOLN
40.0000 mg | Freq: Once | INTRAMUSCULAR | Status: AC
  Administered 2013-11-09: 40 mg via INTRAVENOUS

## 2013-11-09 MED ORDER — ENOXAPARIN SODIUM 40 MG/0.4ML ~~LOC~~ SOLN
40.0000 mg | SUBCUTANEOUS | Status: DC
  Administered 2013-11-09: 40 mg via SUBCUTANEOUS
  Filled 2013-11-09 (×3): qty 0.4

## 2013-11-09 MED ORDER — LORAZEPAM 2 MG/ML IJ SOLN
1.0000 mg | Freq: Once | INTRAMUSCULAR | Status: AC
  Administered 2013-11-09: 1 mg via INTRAVENOUS

## 2013-11-09 MED ORDER — LEVALBUTEROL HCL 1.25 MG/0.5ML IN NEBU
1.2500 mg | INHALATION_SOLUTION | Freq: Three times a day (TID) | RESPIRATORY_TRACT | Status: DC
  Administered 2013-11-09 (×2): 1.25 mg via RESPIRATORY_TRACT
  Filled 2013-11-09 (×6): qty 0.5

## 2013-11-09 MED ORDER — RACEPINEPHRINE HCL 2.25 % IN NEBU
0.2500 mL | INHALATION_SOLUTION | Freq: Once | RESPIRATORY_TRACT | Status: AC
  Administered 2013-11-09: 0.25 mL via RESPIRATORY_TRACT
  Filled 2013-11-09: qty 0.5

## 2013-11-09 MED ORDER — TRAZODONE HCL 150 MG PO TABS
150.0000 mg | ORAL_TABLET | Freq: Every day | ORAL | Status: DC
  Administered 2013-11-09 – 2013-11-15 (×7): 150 mg via ORAL
  Filled 2013-11-09 (×9): qty 1

## 2013-11-09 MED ORDER — SODIUM CHLORIDE 0.9 % IV SOLN
INTRAVENOUS | Status: DC
  Administered 2013-11-09: 20 mL/h via INTRAVENOUS
  Administered 2013-11-11: 14:00:00 via INTRAVENOUS

## 2013-11-09 MED ORDER — FUROSEMIDE 10 MG/ML IJ SOLN
INTRAMUSCULAR | Status: AC
  Filled 2013-11-09: qty 4

## 2013-11-09 NOTE — Progress Notes (Signed)
0900 called to room by Dr. Allyson Sabal. Pt experiencing SOB and jerky arm and leg movements. Orders were given to administer 40mg  of IV lasix and 0.5mg  of IV ativan and call Rapid Response. Rapid response was called in addition to respiratory therapy. Pt received 0.5mg  of IV ativan and 40mg  of IV lasix at 0912. Pts VS were taken at 0920: temp 98.9, BP 105/70, HR 103, RR 23, O2 93 on 3L. Pt very anxious and receiving no relief from 3L of O2. Pt placed on BiPAP by respiratory therapy per MD orders and Critical Care was consulted. Patient given 1mg  of Dilaudid at 0932 for L1 compression fracture pain in back. Dr. Lake Bells with [facility] came to see patient and orders were given to administer an additional 1mg  of IV ativan at [phone] and transfer patient to 2100 or 48M. Report was called to Helene Kelp, RN on 48M and patient transferred off unit.

## 2013-11-09 NOTE — Progress Notes (Signed)
Called to room per floor RN at 0145 for pt in respiratory distress.Chronic pain patient with multiple prior ankle surgeries  fell at home on right side with resultant spine, right wrist, right ankle pain. Upon my arrival to room at 0150, pt found in bed in clear distress, frequent coughing, crying, rr 30s, po2 97-100 on 4 LNC. Respiratory Therapist at bedside. Lungs auscultated, diminished breath sounds, pt very anxious and not willing or unable to take deep breaths. [facility] NP paged and updated on pt status, ativan 1 mg IVP ordered for anxiety. Ativan given along with Neb treatment. With environment modification and reduced stimuli, pt anxiety improved. RN to monitor, and advised to contact myself and provider for worsening condition.

## 2013-11-09 NOTE — Progress Notes (Signed)
PT Cancellation Note  Patient Details Name: [name] Fitzpatrick MRN: [id number] DOB: 1955/11/13   Cancelled Treatment:    Reason Eval/Treat Not Completed: Medical issues which prohibited therapy Rapid response called for patient assessment due to respiratory distress. Will follow up for PT evaluation when patient medically ready.  [place], [place]   [name] 11/09/2013, 12:07 PM

## 2013-11-09 NOTE — Progress Notes (Signed)
Called by RN to assess pt for increased WOB. Arrived to pt's room, pt on 4L [place], spo2 94%, pt is having increased coughing but pt able to voice. Pt sounds coarse and continues to cough. Rapid response RN at bedside, MD called, CCM MD called, Dr. Hartford Poli at bedside and pt placed on NIV per MD order, pt tolerating well, VT's 700-900, pt seems more calm and WOB has improved after NIV was placed on and anxiety meds were given by RN, pt being transferred to 2M03.

## 2013-11-09 NOTE — Progress Notes (Signed)
MD gives orders for duoneb and 1mg  IV ativan. Duoneb pt is receiving at preasent. Ativan 1mg  ivp is in. Patient continues to show same cough as at initial assessment, dry course and non-productive. Patient very tearful and states she wants her husband and did not ask to come to this hospital. Edison Simon obtained per patients request and neb tx over. Air exchange improved per resp therapy

## 2013-11-09 NOTE — Progress Notes (Signed)
[facility] PROGRESS NOTE  [name] Fitzpatrick QIW:[id number] DOB: September 28, 1955 DOA: 11/07/2013 PCP: [name], MD  Assessment/Plan: Principal Problem:   Compression fracture of L1 lumbar vertebra Active Problems:   NECK PAIN, CHRONIC   COPD (chronic obstructive pulmonary disease)   Post traumatic stress disorder (PTSD)   Back pain, acute   Fall at home   Acute resp distress secondary to large component of VCD  COPD Improved with IV sedation with Ativan,racemic epinephrine, Xopenex nebulizer Placed on BiPAP, transferred to ICU Appreciate critical care input if she needs MRI of back she may well need intubation as safety precaution   Compression fracture of L1  Patient's pain seems to be uncontrolled  Interventional radiology to plan further procedures with the help of critical care, and she will possibly need intubation for any procedures Patient n.p.o. for expected vertebroplasty versus kyphoplasty MRI shows acute endplate compression fracture at L1  Right ankle ventral plate fracture  Previous History of orthopedic surgery at [facility] Dr. [name] to see the patient  He recommends,OK for patient to mobilize with WBAT on the right ankle in a CAM boot.  2. F/u as outpatient with [facility] for elective revision of arthrodesis  Chronic pain syndrome  Patient currently on methadone, IV Dilaudid, IV Robaxin   Type 2 diabetes  Hemoglobin A1c on 8/19 was 5.5  Continue sliding scale a   Hypothyroidism continue Synthroid, TSH normal     Code Status: full Family Communication: family updated about patient's clinical progress Disposition Plan:  As above    Brief narrative: 58 yo WF with hx of intubations for stridor, copd, chronic pain on methadone, fibromyalgia, anxiety disorder, suicide attempts who fell and was admitted 8/23 post fall with lumbar compression fx. She developed acute resp distress 8/24, PCCM came to room at Rapid response team  request. She was placed on NIMVS, sedated and moved to ICU. Hopefully we will avoid intubation but she may need intubation if refractory to current treatments   Consultants:  Critical care  Dementia with radiology  Orthopedics  Procedures:  None  Antibiotics:  None  HPI/Subjective: Patient extremely anxious, rapid response and critical care notifed  Objective: Filed Vitals:   11/09/13 0631 11/09/13 0826 11/09/13 0920 11/09/13 1035  BP:   105/70   Pulse:   103   Temp:   98.9 F (37.2 C) 98.5 F (36.9 C)  TempSrc:   Oral Oral  Resp:   28   Height:      Weight: 95.255 kg (210 lb)     SpO2:  99% 93%    No intake or output data in the 24 hours ending 11/09/13 1313  Exam:  General: alert & oriented x 3 In NAD  Cardiovascular: RRR, nl S1 s2  Respiratory: Decreased breath sounds at the bases, scattered rhonchi, no crackles  Abdomen: soft +BS NT/ND, no masses palpable  Extremities: No cyanosis and no edema      Data Reviewed: Basic Metabolic Panel:  Recent Labs Lab 11/04/13 1703 11/08/13 0020 11/08/13 0634  NA 139 138 139  K 4.6 4.0 4.2  CL 102 97 101  CO2 28 30 29   GLUCOSE 82 80 89  BUN 16 18 14   CREATININE 0.79 0.80 0.74  CALCIUM 9.9 9.4 9.4    Liver Function Tests:  Recent Labs Lab 11/08/13 0020  AST 20  ALT 13  ALKPHOS 69  BILITOT 0.2*  PROT 7.3  ALBUMIN 3.8   No results found for this  basename: LIPASE, AMYLASE,  in the last 168 hours No results found for this basename: AMMONIA,  in the last 168 hours  CBC:  Recent Labs Lab 11/08/13 0020 11/08/13 0634  WBC 8.5 5.9  NEUTROABS 5.5  --   HGB 11.0* 10.5*  HCT 32.8* 31.7*  MCV 86.8 86.4  PLT 181 149*    Cardiac Enzymes:  Recent Labs Lab 11/08/13 0020 11/09/13 1155  TROPONINI <0.30 <0.30   BNP (last 3 results) No results found for this basename: PROBNP,  in the last 8760 hours   CBG:  Recent Labs Lab 11/08/13 1809 11/08/13 2249 11/09/13 0535 11/09/13 1032  11/09/13 1208  GLUCAP 85 172* 91 100* 96    No results found for this or any previous visit (from the past 240 hour(s)).   Studies: Dg Lumbar Spine Complete  11/08/2013   CLINICAL DATA:  58 year old female status post fall with pain. Prior surgery. Initial encounter.  EXAM: LUMBAR SPINE - COMPLETE 4+ VIEW  COMPARISON:  CT Abdomen and Pelvis 09/01/2010.  Chest CT 09/14/2011.  FINDINGS: Sequelae of posterior and interbody fusion re- identified in the lower lumbar spine. Lumbar segmentation appears to be normal, the operated levels are L4-L5 and L5-S1. New L1 compression fracture since 2013, primarily with superior endplate deformity. Mild loss of height. No evidence of retropulsed bone. Other lumbar levels appear stable and intact. Grossly intact visualized lower thoracic levels. Sacral ala and SI joints appear grossly intact.  IMPRESSION: 1. L1 compression fracture new since 2013, suspicious for acute injury in this setting. If specific therapy such as vertebroplasty is desired, lumbar MRI or whole-body bone scan would best confirm acuity. 2. Otherwise stable postoperative appearance of the lumbar spine.   Electronically Signed   By: Lars Pinks M.D.   On: 11/08/2013 01:42   Dg Pelvis 1-2 Views  11/08/2013   CLINICAL DATA:  Fall with back pain  EXAM: PELVIS - 1-2 VIEW  COMPARISON:  None.  FINDINGS: No evidence of pelvic ring fracture or diastasis. No evidence of hip fracture or dislocation. Irregularity of the bilateral anterior iliac wings consistent with harvest sites in this patient with lower lumbar fusions.  IMPRESSION: No acute osseous findings.   Electronically Signed   By: Jorje Guild M.D.   On: 11/08/2013 01:36   Dg Wrist Complete Right  11/08/2013   CLINICAL DATA:  Fall  EXAM: RIGHT WRIST - COMPLETE 3+ VIEW  COMPARISON:  None.  FINDINGS: There is no evidence of fracture or dislocation. There is no evidence of arthropathy or other focal bone abnormality. Soft tissues are unremarkable.   IMPRESSION: Negative.   Electronically Signed   By: Jeannine Boga M.D.   On: 11/08/2013 01:49   Dg Ankle Complete Right  11/08/2013   CLINICAL DATA:  Fall.  EXAM: RIGHT ANKLE - COMPLETE 3+ VIEW  COMPARISON:  07/01/2011  FINDINGS: Status post attempted tibiotalar arthrodesis with plate and lag screw traversing the joint. Progressive lucency around the hardware, with an anterior plate fracture that is new. No osseous erosion or periosteal reaction strongly suspicious for infection. There has also been at a subtalar arthrodesis which appears complete. Lateral calcaneus plate and screw fixation for remote injury. No acute osseous fracture. No dislocation.  IMPRESSION: 1. Failed ankle arthrodesis with ventral plate fracture that is new from 2013. 2. Complete subtalar arthrodesis. 3. No acute osseous fracture.   Electronically Signed   By: Jorje Guild M.D.   On: 11/08/2013 01:47   Mr Lumbar Spine Wo  Contrast  11/08/2013   CLINICAL DATA:  Severe low back pain status post fall yesterday. L1 compression fracture.  EXAM: MRI LUMBAR SPINE WITHOUT CONTRAST  TECHNIQUE: Multiplanar, multisequence MR imaging of the lumbar spine was performed. No intravenous contrast was administered.  COMPARISON:  Abdominal CT 09/01/2010. Lumbar spine radiographs 11/08/2013.  FINDINGS: Examination is mildly motion degraded.  Five lumbar type vertebral bodies are assumed. There are stable postsurgical changes status post L4 through S1 PLIF. As demonstrated on the earlier radiographs, there is a new mild superior endplate compression deformity at L1. This results in approximately 15% loss of vertebral body height. There is associated marrow edema within the superior endplate. There is 3 mm of osseous retropulsion. The posterior elements are intact. No other fractures are demonstrated. The alignment is normal.  The conus medullaris extends to the L2-3 level and appears normal. No acute paraspinal abnormalities are seen. There is  chronic fatty atrophy of the erector spinae musculature.  There is mild disc degeneration in the lower thoracic spine. The discs are well hydrated with maintained height at L1-2, L2-3 and L3-4. There is no evidence of spinal stenosis or nerve root encroachment.  IMPRESSION: 1. Acute mild superior endplate compression deformity at L1 with minimal associated osseous retropulsion. The posterior elements are intact. 2. No other acute osseous findings or malalignment. 3. Stable postsurgical changes status post L4 through S1 fusion.   Electronically Signed   By: Camie Patience M.D.   On: 11/08/2013 17:16   Dg Chest Port 1 View  11/09/2013   CLINICAL DATA:  Shortness of breath.  EXAM: PORTABLE CHEST - 1 VIEW  COMPARISON:  Aug 14, 2012.  FINDINGS: Stable cardiomediastinal silhouette. No pneumothorax or pleural effusion is noted. Increased central pulmonary vascular congestion is noted. Linear densities are noted in both lung bases most consistent with subsegmental atelectasis. Bony thorax intact.  IMPRESSION: Increased central pulmonary vascular congestion. Increased bilateral basilar linear densities are noted consistent with subsegmental atelectasis.   Electronically Signed   By: Sabino Dick M.D.   On: 11/09/2013 08:48    Scheduled Meds: . enoxaparin (LOVENOX) injection  40 mg Subcutaneous Q24H  . fluticasone  2 spray Each Nare Daily  . furosemide      . insulin aspart  0-9 Units Subcutaneous TID WC  . levalbuterol  1.25 mg Nebulization Q8H  . levothyroxine  224 mcg Oral QAC breakfast  . LORazepam      . LORazepam      . methadone  10 mg Oral TID  . methylPREDNISolone (SOLU-MEDROL) injection  60 mg Intravenous Q12H  . sertraline  100 mg Oral Daily   Continuous Infusions:   Principal Problem:   Compression fracture of L1 lumbar vertebra Active Problems:   NECK PAIN, CHRONIC   COPD (chronic obstructive pulmonary disease)   Post traumatic stress disorder (PTSD)   Back pain, acute   Fall at  home    Time spent: 44 minutes   [facility] Pager [phone]. If 7PM-7AM, please contact night-coverage at [email or web address], password [facility] 11/09/2013, 1:13 PM  LOS: 2 days

## 2013-11-09 NOTE — Telephone Encounter (Signed)
noted 

## 2013-11-09 NOTE — Progress Notes (Signed)
eLink Physician-Brief Progress Note Patient Name: [name] Fitzpatrick DOB: 10-25-1955 MRN: [id number]   Date of Service  11/09/2013  HPI/Events of Note  Pt requesting resume home sleep med   eICU Interventions  Resume trazadone 150 mg qhs     Intervention Category Minor Interventions: Routine modifications to care plan (e.g. PRN medications for pain, fever)  Christinia Gully 11/09/2013, 10:48 PM

## 2013-11-09 NOTE — Care Management Note (Addendum)
    Page 1 of 1   11/16/2013     3:48:18 PM CARE MANAGEMENT NOTE 11/16/2013  Patient:  [name] Fitzpatrick, [name] Fitzpatrick   Account Number:  [id number]  Date Initiated:  11/09/2013  Documentation initiated by:  Elissa Hefty  Subjective/Objective Assessment:   adm after fall, inc work of breathing and now on bipap     Action/Plan:   lives w husband, pcp dr Joycelyn Schmid simpson   Anticipated DC Date:  11/16/2013   Anticipated DC Plan:  [place] referral  Clinical Social Worker      DC Planning Services  CM consult      Choice offered to / List presented to:             Status of service:  Completed, signed off Medicare Important Message given?  YES (If response is "NO", the following Medicare IM given date fields will be blank) Date Medicare IM given:  11/10/2013 Medicare IM given by:  Elissa Hefty Date Additional Medicare IM given:  11/16/2013 Additional Medicare IM given by:  Tomi Bamberger  Discharge Disposition:  [place]  Per UR Regulation:  Reviewed for med. necessity/level of care/duration of stay  If discussed at [place] of Stay Meetings, dates discussed:    Comments:  11/16/13 [place], BSN [phone] patient is for dc to snf today.

## 2013-11-09 NOTE — Progress Notes (Signed)
**Note De-Identified  Obfuscation** Patient removed from BIPAP and placed on 2L [place] tolerating well; RT to cont. To monitor

## 2013-11-09 NOTE — Progress Notes (Signed)
Patient shows difficulty breathing except when speaking in full sentences. Asking for coke, coke given to patient. Rapid response nurse and RT states to call them if further assistance is needed. Rapid response nurse agrees that shortness of breath is anxiety. Placed back on 3lnc. Will do frequent vital signs and monitor closely.

## 2013-11-09 NOTE — Significant Event (Signed)
Rapid Response Event Note  Overview:  Called by RN Wells Guiles who states Dr. Allyson Sabal would like RRT to bedside for patient with resp distress.  RT already called and on the way. Time Called: 0907 Arrival Time: 0911 Event Type: Respiratory  Initial Focused Assessment: On arrival patient supine in bed - alert - color pink - answering questions - skin warm and dry - resps rapid - coughing with upper airway noise - appears anxious- bil BS with decreased air movement - will relax and speak in full sentences - air movement noted bil at this time - patient denies VCD but states she does have episodes similar to this at home - when asked what made it better she states she goes to the doctor - does endorse an inhaler - O2 sats 94-95% on 3 liter nasal cannula.  Patient states she is tired and in a lot of back pain.  States she has a history of intubation several times in the past.  Just had Albuterol treatment.  ST 105 on monitor - RR 28 Bp 105/70.  Admitted on 8/22 s/p fall and lumbar verbertal compression fx.     Interventions:  Was given 0.5 mg Ativan prior to my arrival.  Patient repositioned and resassured per this RN.  She is anxious - no JVD noted - will relax and talk for minute or so - then back with coughing.  PCXR reviewed - RN states she as also given 40 mg Lasix IV.  Abd soft - right wrist in splint.  Denies pain in legs - just back.  RN states she has been on her home med regimen for pain control to include Methadone. Dilaudid 1 mg IV given now for back pain.  RT now at bedside with BiPap machine.  Dr. Lake Bells and Richardson Landry Minor NP to bedside.  Ativan 1 mg IV given per order.  Patient reassured.  Bipap applied.  BP 110/68 HR 95 RR24 O2 sats 95%.  After 10 minutes patient relaxed with easy respirations.  BIl BS with good air movement.  Patient transferred to 2M06 without incident.  Handoff the [employer].     Event Summary: Name of Physician Notified: Dr. Allyson Sabal at  (on unit req RRT team)  Name of  Consulting Physician Notified: Dr. Lake Bells at [phone] (by Dr. Allyson Sabal)  Outcome: Transferred (Comment) [phone])  Event End Time: 6812  Quin Hoop

## 2013-11-09 NOTE — Consult Note (Signed)
PULMONARY / CRITICAL CARE MEDICINE   Name: [name] Fitzpatrick MRN: [id number] DOB: 1955/12/31    ADMISSION DATE:  11/07/2013 CONSULTATION DATE: 8/24  REFERRING MD :  FPTS  CHIEF COMPLAINT:  Resp distress  INITIAL PRESENTATION:  [place] and had lumbar compression fx.  STUDIES:    SIGNIFICANT EVENTS: 8/24 VCD tx to ICU   HISTORY OF PRESENT ILLNESS:   58 yo WF with hx of intubations for stridor, copd, chronic pain on methadone, fibromyalgia, anxiety disorder, suicide attempts who fell and was admitted 8/23 post fall with lumbar compression fx. She developed acute resp distress 8/24, PCCM came to room at Rapid response team request. She was placed on NIMVS, sedated and moved to ICU. Hopefully we will avoid intubation but she may need intubation if refractory to current treatments.  PAST MEDICAL HISTORY :  Past Medical History  Diagnosis Date  . Anxiety   . Depression   . Hypothyroidism   . Osteoarthritis   . IBS (irritable bowel syndrome)     with constipation  . Fibromyalgia   . Chronic LBP   . Migraine headache   . Chronic neck pain   . DM type 2 (diabetes mellitus, type 2)   . Hypertension   . COPD (chronic obstructive pulmonary disease)   . H/O suicide attempt    Past Surgical History  Procedure Laterality Date  . Reconstucted right ankle    . C-spine fusion  1991, 2004  . Vesicovaginal fistula closure w/ tah  1993    Fibroids no Ca  . Right knee arthroscopic surgery    . B/l foot surgery --bunions    . L carpal tunnel release    . L spine fusion x 2    . Total reconstruction of right ankle and heel      total of three   . Abdominal hysterectomy    . Cholecystectomy     Prior to Admission medications   Medication Sig Start Date End Date Taking? Authorizing Provider  albuterol-ipratropium (COMBIVENT) 18-103 MCG/ACT inhaler Inhale 2 puffs into the lungs 3 (three) times daily.   Yes Historical Provider, MD  aspirin EC 81 MG tablet Take 81 mg by mouth daily.    Yes Historical Provider, MD  dicyclomine (BENTYL) 10 MG capsule Take 10 mg by mouth 2 (two) times daily.   Yes Historical Provider, MD  estradiol (ESTRACE) 0.1 MG/GM vaginal cream Place 1 Applicatorful vaginally at bedtime.   Yes Historical Provider, MD  fluticasone (FLONASE) 50 MCG/ACT nasal spray Place 2 sprays into both nostrils daily.   Yes Historical Provider, MD  furosemide (LASIX) 20 MG tablet Take 20 mg by mouth 2 (two) times daily.   Yes Historical Provider, MD  levothyroxine (SYNTHROID, LEVOTHROID) 112 MCG tablet Take 224 mcg by mouth daily before breakfast.   Yes Historical Provider, MD  lidocaine (LIDODERM) 5 % Place 1 patch onto the skin daily.  10/21/12  Yes Historical Provider, MD  meclizine (ANTIVERT) 12.5 MG tablet Take 12.5 mg by mouth 3 (three) times daily as needed for dizziness.   Yes Historical Provider, MD  methadone (DOLOPHINE) 10 MG tablet Take 10 mg by mouth 3 (three) times daily.   Yes Historical Provider, MD  ondansetron (ZOFRAN) 4 MG tablet Take 4 mg by mouth 2 (two) times daily.   Yes Historical Provider, MD  polyethylene glycol (MIRALAX / GLYCOLAX) packet Take 17 g by mouth daily as needed for mild constipation.   Yes Historical Provider, MD  potassium chloride SA (  K-DUR,KLOR-CON) 20 MEQ tablet Take 20 mEq by mouth 2 (two) times daily.   Yes Historical Provider, MD  promethazine-dextromethorphan (PROMETHAZINE-DM) 6.25-15 MG/5ML syrup Take 5 mLs by mouth 2 (two) times daily as needed for cough.   Yes Historical Provider, MD  sertraline (ZOLOFT) 100 MG tablet Take 100 mg by mouth daily.   Yes Historical Provider, MD  solifenacin (VESICARE) 10 MG tablet Take 5 mg by mouth daily.   Yes Historical Provider, MD  traZODone (DESYREL) 150 MG tablet Take 150 mg by mouth at bedtime as needed for sleep.   Yes Historical Provider, MD   Allergies  Allergen Reactions  . Prednisone Shortness Of Breath and Nausea And Vomiting  . Penicillins Other (See Comments)    Pt states she  almost died after taking the following. She states that she had Stroke like symptoms post taking the medication  . Cephalexin Hives, Swelling and Rash  . Latex Rash and Other (See Comments)    Causes skin to tear easily  . Metoclopramide Hcl Hives and Rash    FAMILY HISTORY:  Family History  Problem Relation Age of Onset  . Cancer Father     pancreatic   . Pancreatic cancer Father   . Heart failure Mother   . GI problems Mother   . Heart disease Mother   . Diabetes Mother   . Thyroid disease Sister   . Cancer Maternal Grandfather   . Cancer      family history    SOCIAL HISTORY:  reports that she has quit smoking. Her smoking use included Cigarettes. She has a 12 pack-year smoking history. She does not have any smokeless tobacco history on file. She reports that she uses illicit drugs. She reports that she does not drink alcohol.  REVIEW OF SYSTEMS: NA  SUBJECTIVE:   VITAL SIGNS: Temp:  [97.9 F (36.6 C)-98.9 F (37.2 C)] 98.9 F (37.2 C) (08/24 0920) Pulse Rate:  [77-103] 103 (08/24 0920) Resp:  [18-28] 28 (08/24 0920) BP: (103-115)/(54-79) 105/70 mmHg (08/24 0920) SpO2:  [90 %-99 %] 93 % (08/24 0920) Weight:  [210 lb (95.255 kg)] 210 lb (95.255 kg) (08/24 0631) HEMODYNAMICS:   VENTILATOR SETTINGS:   INTAKE / OUTPUT: No intake or output data in the 24 hours ending 11/09/13 1006  PHYSICAL EXAMINATION: General:  Obese wf in distress Neuro:  Anxious, unable to speak HEENT:  Large VCD component, no jvd Cardiovascular:  HSR RRR Lungs:  Poor air movement but clear Abdomen: Obese + bs Musculoskeletal:  Old scars rt ankle, rt wrist wrap in place Skin:  warm  LABS:  CBC  Recent Labs Lab 11/08/13 0020 11/08/13 0634  WBC 8.5 5.9  HGB 11.0* 10.5*  HCT 32.8* 31.7*  PLT 181 149*   Coag's  Recent Labs Lab 11/08/13 0020  APTT 29  INR 0.96   BMET  Recent Labs Lab 11/04/13 1703 11/08/13 0020 11/08/13 0634  NA 139 138 139  K 4.6 4.0 4.2  CL 102 97  101  CO2 28 30 29   BUN 16 18 14   CREATININE 0.79 0.80 0.74  GLUCOSE 82 80 89   Electrolytes  Recent Labs Lab 11/04/13 1703 11/08/13 0020 11/08/13 0634  CALCIUM 9.9 9.4 9.4   Sepsis Markers No results found for this basename: LATICACIDVEN, PROCALCITON, O2SATVEN,  in the last 168 hours ABG No results found for this basename: PHART, PCO2ART, PO2ART,  in the last 168 hours Liver Enzymes  Recent Labs Lab 11/08/13 0020  AST 20  ALT 13  ALKPHOS 69  BILITOT 0.2*  ALBUMIN 3.8   Cardiac Enzymes  Recent Labs Lab 11/08/13 0020  TROPONINI <0.30   Glucose  Recent Labs Lab 11/08/13 1152 11/08/13 1809 11/08/13 2249 11/09/13 0535  GLUCAP 85 85 172* 91    Imaging Dg Lumbar Spine Complete  11/08/2013   CLINICAL DATA:  58 year old female status post fall with pain. Prior surgery. Initial encounter.  EXAM: LUMBAR SPINE - COMPLETE 4+ VIEW  COMPARISON:  CT Abdomen and Pelvis 09/01/2010.  Chest CT 09/14/2011.  FINDINGS: Sequelae of posterior and interbody fusion re- identified in the lower lumbar spine. Lumbar segmentation appears to be normal, the operated levels are L4-L5 and L5-S1. New L1 compression fracture since 2013, primarily with superior endplate deformity. Mild loss of height. No evidence of retropulsed bone. Other lumbar levels appear stable and intact. Grossly intact visualized lower thoracic levels. Sacral ala and SI joints appear grossly intact.  IMPRESSION: 1. L1 compression fracture new since 2013, suspicious for acute injury in this setting. If specific therapy such as vertebroplasty is desired, lumbar MRI or whole-body bone scan would best confirm acuity. 2. Otherwise stable postoperative appearance of the lumbar spine.   Electronically Signed   By: Lars Pinks M.D.   On: 11/08/2013 01:42   Dg Pelvis 1-2 Views  11/08/2013   CLINICAL DATA:  Fall with back pain  EXAM: PELVIS - 1-2 VIEW  COMPARISON:  None.  FINDINGS: No evidence of pelvic ring fracture or diastasis. No  evidence of hip fracture or dislocation. Irregularity of the bilateral anterior iliac wings consistent with harvest sites in this patient with lower lumbar fusions.  IMPRESSION: No acute osseous findings.   Electronically Signed   By: Jorje Guild M.D.   On: 11/08/2013 01:36   Dg Wrist Complete Right  11/08/2013   CLINICAL DATA:  Fall  EXAM: RIGHT WRIST - COMPLETE 3+ VIEW  COMPARISON:  None.  FINDINGS: There is no evidence of fracture or dislocation. There is no evidence of arthropathy or other focal bone abnormality. Soft tissues are unremarkable.  IMPRESSION: Negative.   Electronically Signed   By: Jeannine Boga M.D.   On: 11/08/2013 01:49   Dg Ankle Complete Right  11/08/2013   CLINICAL DATA:  Fall.  EXAM: RIGHT ANKLE - COMPLETE 3+ VIEW  COMPARISON:  07/01/2011  FINDINGS: Status post attempted tibiotalar arthrodesis with plate and lag screw traversing the joint. Progressive lucency around the hardware, with an anterior plate fracture that is new. No osseous erosion or periosteal reaction strongly suspicious for infection. There has also been at a subtalar arthrodesis which appears complete. Lateral calcaneus plate and screw fixation for remote injury. No acute osseous fracture. No dislocation.  IMPRESSION: 1. Failed ankle arthrodesis with ventral plate fracture that is new from 2013. 2. Complete subtalar arthrodesis. 3. No acute osseous fracture.   Electronically Signed   By: Jorje Guild M.D.   On: 11/08/2013 01:47   Mr Lumbar Spine Wo Contrast  11/08/2013   CLINICAL DATA:  Severe low back pain status post fall yesterday. L1 compression fracture.  EXAM: MRI LUMBAR SPINE WITHOUT CONTRAST  TECHNIQUE: Multiplanar, multisequence MR imaging of the lumbar spine was performed. No intravenous contrast was administered.  COMPARISON:  Abdominal CT 09/01/2010. Lumbar spine radiographs 11/08/2013.  FINDINGS: Examination is mildly motion degraded.  Five lumbar type vertebral bodies are assumed. There  are stable postsurgical changes status post L4 through S1 PLIF. As demonstrated on the earlier radiographs, there is a new  mild superior endplate compression deformity at L1. This results in approximately 15% loss of vertebral body height. There is associated marrow edema within the superior endplate. There is 3 mm of osseous retropulsion. The posterior elements are intact. No other fractures are demonstrated. The alignment is normal.  The conus medullaris extends to the L2-3 level and appears normal. No acute paraspinal abnormalities are seen. There is chronic fatty atrophy of the erector spinae musculature.  There is mild disc degeneration in the lower thoracic spine. The discs are well hydrated with maintained height at L1-2, L2-3 and L3-4. There is no evidence of spinal stenosis or nerve root encroachment.  IMPRESSION: 1. Acute mild superior endplate compression deformity at L1 with minimal associated osseous retropulsion. The posterior elements are intact. 2. No other acute osseous findings or malalignment. 3. Stable postsurgical changes status post L4 through S1 fusion.   Electronically Signed   By: Camie Patience M.D.   On: 11/08/2013 17:16     ASSESSMENT / PLAN:  PULMONARY OETT A: Acute resp distress secondary to large component of VCD COPD, doubt exacerbation P:   Sedation NIMVS May need intubation as she has been intubated in past. BD's No role for steroids at this time  Note if she needs MRI of back she may well need intubation as safety precaution.   CARDIOVASCULAR CVL A:  HTN P:  Monitor BP Antihypertensives  RENAL A:   No acute issue P:     GASTROINTESTINAL A:   IBS P:   Medications as needed  HEMATOLOGIC A:   No acute issue P:    INFECTIOUS A:  none P:    [id number]  ENDOCRINE A:   DM   Hypothyroidism P:   SSI Synthroid   NEUROLOGIC A:   Anxiety Fibromyalgia Chronic pain on methadone Hx of suicidal ideation P:   RASS goal: 1 Sedated and  anxiolytics as needed.  Ortho A: L1 fracture Ankle fracture P: Per ortho  Richardson Landry Minor ACNP Maryanna Shape PCCM Pager [phone] till 3 pm If no answer page [phone] 11/09/2013, 10:07 AM   TODAY'S SUMMARY:  Rapid response called for acute resp distress, large component of VCD and anxiety. Sedated placed on bipap and transferred to ICU.  Attending:  I have seen and examined the patient with nurse practitioner/resident and agree with the note above.   Today we moved this lady to the ICU for what looks like a bad exacerbation of vocal cord dysfunction. I don't think she needs antibiotics, steroids, or diuresis.  She may need to be intubated if she requires an MRI of the spine, but for today (8/24) I plan to let her calm down on BIPAP and monitor in the ICU.  I have personally obtained a history, examined the patient, evaluated laboratory and imaging results, formulated the assessment and plan and placed orders. CRITICAL CARE: The patient is critically ill with multiple organ systems failure and requires high complexity decision making for assessment and support, frequent evaluation and titration of therapies, application of advanced monitoring technologies and extensive interpretation of multiple databases. Critical Care Time devoted to patient care services described in this note is 40 minutes.    Roselie Awkward, MD [place] [facility] Pager: [phone] Cell: [phone] If no response, call [phone]   11/09/2013, 10:06 AM

## 2013-11-09 NOTE — Progress Notes (Signed)
137/99 103, 20, sat97% on 3lnc.   Patient found having poor air exchange. RT phoned. Rapid response phoned. Patient having difficulty breathing except when report given to rapid response nurse.

## 2013-11-10 ENCOUNTER — Inpatient Hospital Stay (HOSPITAL_COMMUNITY): Payer: Medicare FFS

## 2013-11-10 DIAGNOSIS — R0609 Other forms of dyspnea: Secondary | ICD-10-CM

## 2013-11-10 DIAGNOSIS — R0603 Acute respiratory distress: Secondary | ICD-10-CM

## 2013-11-10 DIAGNOSIS — R0989 Other specified symptoms and signs involving the circulatory and respiratory systems: Secondary | ICD-10-CM

## 2013-11-10 LAB — GLUCOSE, CAPILLARY
GLUCOSE-CAPILLARY: 125 mg/dL — AB (ref 70–99)
GLUCOSE-CAPILLARY: 138 mg/dL — AB (ref 70–99)
Glucose-Capillary: 129 mg/dL — ABNORMAL HIGH (ref 70–99)

## 2013-11-10 MED ORDER — ALBUTEROL SULFATE (2.5 MG/3ML) 0.083% IN NEBU: 2.5000 mg | INHALATION_SOLUTION | RESPIRATORY_TRACT | Status: DC | PRN

## 2013-11-10 MED ORDER — IPRATROPIUM-ALBUTEROL 0.5-2.5 (3) MG/3ML IN SOLN
3.0000 mL | Freq: Three times a day (TID) | RESPIRATORY_TRACT | Status: DC
  Administered 2013-11-10 – 2013-11-11 (×4): 3 mL via RESPIRATORY_TRACT
  Filled 2013-11-10 (×4): qty 3

## 2013-11-10 MED ORDER — ENOXAPARIN SODIUM 40 MG/0.4ML ~~LOC~~ SOLN
40.0000 mg | SUBCUTANEOUS | Status: AC
  Administered 2013-11-10: 40 mg via SUBCUTANEOUS
  Filled 2013-11-10: qty 0.4

## 2013-11-10 MED ORDER — LEVALBUTEROL HCL 1.25 MG/0.5ML IN NEBU
1.2500 mg | INHALATION_SOLUTION | Freq: Three times a day (TID) | RESPIRATORY_TRACT | Status: DC
  Administered 2013-11-10: 1.25 mg via RESPIRATORY_TRACT
  Filled 2013-11-10 (×4): qty 0.5

## 2013-11-10 MED ORDER — BUDESONIDE 0.25 MG/2ML IN SUSP
0.2500 mg | Freq: Two times a day (BID) | RESPIRATORY_TRACT | Status: DC
  Administered 2013-11-10 – 2013-11-14 (×4): 0.25 mg via RESPIRATORY_TRACT
  Filled 2013-11-10 (×15): qty 2

## 2013-11-10 MED ORDER — VANCOMYCIN HCL IN DEXTROSE 1-5 GM/200ML-% IV SOLN
1000.0000 mg | INTRAVENOUS | Status: AC
  Filled 2013-11-10 (×2): qty 200

## 2013-11-10 NOTE — Progress Notes (Signed)
Patient ID: [name] Fitzpatrick, female   DOB: 11-18-1955, 58 y.o.   MRN: [id number] Request received from Dr. Allyson Sabal for L1 VP/KP on pt. 58 yo WF with hx of intubations for stridor, COPD, chronic pain on methadone, fibromyalgia, anxiety disorder, suicide attempts who fell and was admitted 8/23 post fall with lumbar compression fx. Subsequent MRI revealed acute mild superior endplate compression deformity at L1 with minimal associated osseous retropulsion. The posterior elements were intact. . No other acute osseous findings or malalignment. Stable postsurgical changes status post L4 through S1 fusion. Imaging studies were reviewed by Dr. Estanislado Pandy and he feels pt is candidate for VP/KP at L1. She is in a moderate amount of pain at this location. She developed acute resp distress 8/24, PCCM came to room at rapid response team request. She was placed on NIMVS, sedated and moved to ICU. She is currently stable from resp standpoint , awaiting transfer out of ICU and has been cleared for IV conscious sedation by CCM. Additional hx as below. Exam: pt awake/alert; c/o mod back pain ; chest- sl dim BS bases; heart- RRR; abd- soft,+BS,NT; mod paravertebral tenderness at L1 with some radiation of pain to RLQ/HIP region; ext- tender rt ankle from ventral plate  Fx.   Filed Vitals:   11/10/13 1200 11/10/13 1300 11/10/13 1301 11/10/13 1422  BP: 101/47 101/46    Pulse: 62 54    Temp:   98.3 F (36.8 C)   TempSrc:   Oral   Resp: 15 9    Height:      Weight:      SpO2: 96% 95%  98%   Past Medical History  Diagnosis Date  . Anxiety   . Depression   . Hypothyroidism   . Osteoarthritis   . IBS (irritable bowel syndrome)     with constipation  . Fibromyalgia   . Chronic LBP   . Migraine headache   . Chronic neck pain   . DM type 2 (diabetes mellitus, type 2)   . Hypertension   . COPD (chronic obstructive pulmonary disease)   . H/O suicide attempt    Past Surgical History  Procedure Laterality Date  .  Reconstucted right ankle    . C-spine fusion  1991, 2004  . Vesicovaginal fistula closure w/ tah  1993    Fibroids no Ca  . Right knee arthroscopic surgery    . B/l foot surgery --bunions    . L carpal tunnel release    . L spine fusion x 2    . Total reconstruction of right ankle and heel      total of three   . Abdominal hysterectomy    . Cholecystectomy    Dg Lumbar Spine Complete  11/08/2013   CLINICAL DATA:  58 year old female status post fall with pain. Prior surgery. Initial encounter.  EXAM: LUMBAR SPINE - COMPLETE 4+ VIEW  COMPARISON:  CT Abdomen and Pelvis 09/01/2010.  Chest CT 09/14/2011.  FINDINGS: Sequelae of posterior and interbody fusion re- identified in the lower lumbar spine. Lumbar segmentation appears to be normal, the operated levels are L4-L5 and L5-S1. New L1 compression fracture since 2013, primarily with superior endplate deformity. Mild loss of height. No evidence of retropulsed bone. Other lumbar levels appear stable and intact. Grossly intact visualized lower thoracic levels. Sacral ala and SI joints appear grossly intact.  IMPRESSION: 1. L1 compression fracture new since 2013, suspicious for acute injury in this setting. If specific therapy such as vertebroplasty is desired,  lumbar MRI or whole-body bone scan would best confirm acuity. 2. Otherwise stable postoperative appearance of the lumbar spine.   Electronically Signed   By: Lars Pinks M.D.   On: 11/08/2013 01:42   Dg Pelvis 1-2 Views  11/08/2013   CLINICAL DATA:  Fall with back pain  EXAM: PELVIS - 1-2 VIEW  COMPARISON:  None.  FINDINGS: No evidence of pelvic ring fracture or diastasis. No evidence of hip fracture or dislocation. Irregularity of the bilateral anterior iliac wings consistent with harvest sites in this patient with lower lumbar fusions.  IMPRESSION: No acute osseous findings.   Electronically Signed   By: Jorje Guild M.D.   On: 11/08/2013 01:36   Dg Wrist Complete Right  11/08/2013   CLINICAL  DATA:  Fall  EXAM: RIGHT WRIST - COMPLETE 3+ VIEW  COMPARISON:  None.  FINDINGS: There is no evidence of fracture or dislocation. There is no evidence of arthropathy or other focal bone abnormality. Soft tissues are unremarkable.  IMPRESSION: Negative.   Electronically Signed   By: Jeannine Boga M.D.   On: 11/08/2013 01:49   Dg Ankle Complete Right  11/08/2013   CLINICAL DATA:  Fall.  EXAM: RIGHT ANKLE - COMPLETE 3+ VIEW  COMPARISON:  07/01/2011  FINDINGS: Status post attempted tibiotalar arthrodesis with plate and lag screw traversing the joint. Progressive lucency around the hardware, with an anterior plate fracture that is new. No osseous erosion or periosteal reaction strongly suspicious for infection. There has also been at a subtalar arthrodesis which appears complete. Lateral calcaneus plate and screw fixation for remote injury. No acute osseous fracture. No dislocation.  IMPRESSION: 1. Failed ankle arthrodesis with ventral plate fracture that is new from 2013. 2. Complete subtalar arthrodesis. 3. No acute osseous fracture.   Electronically Signed   By: Jorje Guild M.D.   On: 11/08/2013 01:47   Mr Lumbar Spine Wo Contrast  11/08/2013   CLINICAL DATA:  Severe low back pain status post fall yesterday. L1 compression fracture.  EXAM: MRI LUMBAR SPINE WITHOUT CONTRAST  TECHNIQUE: Multiplanar, multisequence MR imaging of the lumbar spine was performed. No intravenous contrast was administered.  COMPARISON:  Abdominal CT 09/01/2010. Lumbar spine radiographs 11/08/2013.  FINDINGS: Examination is mildly motion degraded.  Five lumbar type vertebral bodies are assumed. There are stable postsurgical changes status post L4 through S1 PLIF. As demonstrated on the earlier radiographs, there is a new mild superior endplate compression deformity at L1. This results in approximately 15% loss of vertebral body height. There is associated marrow edema within the superior endplate. There is 3 mm of osseous  retropulsion. The posterior elements are intact. No other fractures are demonstrated. The alignment is normal.  The conus medullaris extends to the L2-3 level and appears normal. No acute paraspinal abnormalities are seen. There is chronic fatty atrophy of the erector spinae musculature.  There is mild disc degeneration in the lower thoracic spine. The discs are well hydrated with maintained height at L1-2, L2-3 and L3-4. There is no evidence of spinal stenosis or nerve root encroachment.  IMPRESSION: 1. Acute mild superior endplate compression deformity at L1 with minimal associated osseous retropulsion. The posterior elements are intact. 2. No other acute osseous findings or malalignment. 3. Stable postsurgical changes status post L4 through S1 fusion.   Electronically Signed   By: Camie Patience M.D.   On: 11/08/2013 17:16   Dg Chest Port 1 View  11/10/2013   CLINICAL DATA:  Shortness of breath and productive  cough, post back surgery, history diabetes, CHF, COPD  EXAM: PORTABLE CHEST - 1 VIEW  COMPARISON:  Portable exam 0846 hr compared 11/09/2013  FINDINGS: Borderline enlargement of cardiac silhouette.  Slight pulmonary vascular congestion.  Mediastinal contours normal.  Bibasilar atelectasis.  No pulmonary infiltrate, pleural effusion or pneumothorax.  IMPRESSION: Bibasilar atelectasis.   Electronically Signed   By: Lavonia Dana M.D.   On: 11/10/2013 09:00   Dg Chest Port 1 View  11/09/2013   CLINICAL DATA:  Shortness of breath.  EXAM: PORTABLE CHEST - 1 VIEW  COMPARISON:  Aug 14, 2012.  FINDINGS: Stable cardiomediastinal silhouette. No pneumothorax or pleural effusion is noted. Increased central pulmonary vascular congestion is noted. Linear densities are noted in both lung bases most consistent with subsegmental atelectasis. Bony thorax intact.  IMPRESSION: Increased central pulmonary vascular congestion. Increased bilateral basilar linear densities are noted consistent with subsegmental atelectasis.    Electronically Signed   By: Sabino Dick M.D.   On: 11/09/2013 08:48  Results for orders placed during the hospital encounter of 11/07/13  CBC WITH DIFFERENTIAL      Result Value Ref Range   WBC 8.5  4.0 - 10.5 K/uL   RBC 3.78 (*) 3.87 - 5.11 MIL/uL   Hemoglobin 11.0 (*) 12.0 - 15.0 g/dL   HCT 32.8 (*) 36.0 - 46.0 %   MCV 86.8  78.0 - 100.0 fL   MCH 29.1  26.0 - 34.0 pg   MCHC 33.5  30.0 - 36.0 g/dL   RDW 13.3  11.5 - 15.5 %   Platelets 181  150 - 400 K/uL   Neutrophils Relative % 66  43 - 77 %   Neutro Abs 5.5  1.7 - 7.7 K/uL   Lymphocytes Relative 26  12 - 46 %   Lymphs Abs 2.2  0.7 - 4.0 K/uL   Monocytes Relative 6  3 - 12 %   Monocytes Absolute 0.5  0.1 - 1.0 K/uL   Eosinophils Relative 2  0 - 5 %   Eosinophils Absolute 0.2  0.0 - 0.7 K/uL   Basophils Relative 0  0 - 1 %   Basophils Absolute 0.0  0.0 - 0.1 K/uL  COMPREHENSIVE METABOLIC PANEL      Result Value Ref Range   Sodium 138  137 - 147 mEq/L   Potassium 4.0  3.7 - 5.3 mEq/L   Chloride 97  96 - 112 mEq/L   CO2 30  19 - 32 mEq/L   Glucose, Bld 80  70 - 99 mg/dL   BUN 18  6 - 23 mg/dL   Creatinine, Ser 0.80  0.50 - 1.10 mg/dL   Calcium 9.4  8.4 - 10.5 mg/dL   Total Protein 7.3  6.0 - 8.3 g/dL   Albumin 3.8  3.5 - 5.2 g/dL   AST 20  0 - 37 U/L   ALT 13  0 - 35 U/L   Alkaline Phosphatase 69  39 - 117 U/L   Total Bilirubin 0.2 (*) 0.3 - 1.2 mg/dL   GFR calc non Af Amer 80 (*) >90 mL/min   GFR calc Af Amer >90  >90 mL/min   Anion gap 11  5 - 15  PROTIME-INR      Result Value Ref Range   Prothrombin Time 12.8  11.6 - 15.2 seconds   INR 0.96  0.00 - 1.49  TROPONIN I      Result Value Ref Range   Troponin I <0.30  <0.30 ng/mL  URINALYSIS, ROUTINE W REFLEX MICROSCOPIC      Result Value Ref Range   Color, Urine YELLOW  YELLOW   APPearance CLEAR  CLEAR   Specific Gravity, Urine 1.010  1.005 - 1.030   pH 6.0  5.0 - 8.0   Glucose, UA NEGATIVE  NEGATIVE mg/dL   Hgb urine dipstick NEGATIVE  NEGATIVE   Bilirubin  Urine NEGATIVE  NEGATIVE   Ketones, ur NEGATIVE  NEGATIVE mg/dL   Protein, ur NEGATIVE  NEGATIVE mg/dL   Urobilinogen, UA 0.2  0.0 - 1.0 mg/dL   Nitrite NEGATIVE  NEGATIVE   Leukocytes, UA NEGATIVE  NEGATIVE  APTT      Result Value Ref Range   aPTT 29  24 - 37 seconds  BASIC METABOLIC PANEL      Result Value Ref Range   Sodium 139  137 - 147 mEq/L   Potassium 4.2  3.7 - 5.3 mEq/L   Chloride 101  96 - 112 mEq/L   CO2 29  19 - 32 mEq/L   Glucose, Bld 89  70 - 99 mg/dL   BUN 14  6 - 23 mg/dL   Creatinine, Ser 0.74  0.50 - 1.10 mg/dL   Calcium 9.4  8.4 - 10.5 mg/dL   GFR calc non Af Amer >90  >90 mL/min   GFR calc Af Amer >90  >90 mL/min   Anion gap 9  5 - 15  CBC      Result Value Ref Range   WBC 5.9  4.0 - 10.5 K/uL   RBC 3.67 (*) 3.87 - 5.11 MIL/uL   Hemoglobin 10.5 (*) 12.0 - 15.0 g/dL   HCT 31.7 (*) 36.0 - 46.0 %   MCV 86.4  78.0 - 100.0 fL   MCH 28.6  26.0 - 34.0 pg   MCHC 33.1  30.0 - 36.0 g/dL   RDW 13.2  11.5 - 15.5 %   Platelets 149 (*) 150 - 400 K/uL  TSH      Result Value Ref Range   TSH 3.750  0.350 - 4.500 uIU/mL  GLUCOSE, CAPILLARY      Result Value Ref Range   Glucose-Capillary 85  70 - 99 mg/dL  GLUCOSE, CAPILLARY      Result Value Ref Range   Glucose-Capillary 85  70 - 99 mg/dL  GLUCOSE, CAPILLARY      Result Value Ref Range   Glucose-Capillary 172 (*) 70 - 99 mg/dL  GLUCOSE, CAPILLARY      Result Value Ref Range   Glucose-Capillary 91  70 - 99 mg/dL  TROPONIN I      Result Value Ref Range   Troponin I <0.30  <0.30 ng/mL  GLUCOSE, CAPILLARY      Result Value Ref Range   Glucose-Capillary 100 (*) 70 - 99 mg/dL  GLUCOSE, CAPILLARY      Result Value Ref Range   Glucose-Capillary 96  70 - 99 mg/dL  GLUCOSE, CAPILLARY      Result Value Ref Range   Glucose-Capillary 95  70 - 99 mg/dL   Comment 1 Notify RN    GLUCOSE, CAPILLARY      Result Value Ref Range   Glucose-Capillary 168 (*) 70 - 99 mg/dL  GLUCOSE, CAPILLARY      Result Value Ref Range    Glucose-Capillary 138 (*) 70 - 99 mg/dL  GLUCOSE, CAPILLARY      Result Value Ref Range   Glucose-Capillary 129 (*) 70 - 99 mg/dL  GLUCOSE, CAPILLARY  Result Value Ref Range   Glucose-Capillary 125 (*) 70 - 99 mg/dL  POCT I-STAT 3, ART BLOOD GAS (G3+)      Result Value Ref Range   pH, Arterial 7.587 (*) 7.350 - 7.450   pCO2 arterial 33.2 (*) 35.0 - 45.0 mmHg   pO2, Arterial 184.0 (*) 80.0 - 100.0 mmHg   Bicarbonate 31.6 (*) 20.0 - 24.0 mEq/L   TCO2 33.0  0 - 100 mmol/L   O2 Saturation 100.0     Acid-Base Excess 10.0 (*) 0.0 - 2.0 mmol/L   Patient temperature 98.7     Collection site RADIAL     Drawn by OPERATOR     Sample type ARTERIAL     A/P: 58 yo WF with hx of intubations for stridor, COPD, chronic pain on methadone, fibromyalgia, anxiety disorder, suicide attempts who fell and was admitted 8/23 post fall with lumbar compression fx. Subsequent MRI revealed acute mild superior endplate compression deformity at L1 with minimal associated osseous retropulsion. The posterior elements were intact. . No other acute osseous findings or malalignment. Stable postsurgical changes status post L4 through S1 fusion. Imaging studies were reviewed by Dr. Estanislado Pandy and he feels pt is candidate for VP/KP at L1. She is in a moderate amount of pain at this location. She developed acute resp distress 8/24, PCCM came to room at rapid response team request. She was placed on NIMVS, sedated and moved to ICU. She is currently stable from resp standpoint , awaiting transfer out of ICU and has been cleared for IV conscious sedation by CCM. Tent plan is for L1 VP/KP on 8/26. Details/risks of procedure d/w pt with her understanding and consent.

## 2013-11-10 NOTE — Evaluation (Signed)
Physical Therapy Evaluation Patient Details Name: [name] Fitzpatrick MRN: [id number] DOB: Jul 24, 1955 Today's Date: 11/10/2013   History of Present Illness    58 yo WF with hx of intubations for stridor, copd, chronic pain on methadone, fibromyalgia, anxiety disorder, suicide attempts who fell and was admitted 8/23 post fall with lumbar compression fx. She developed acute resp distress 8/24, PCCM came to room at Rapid response team request. She was placed on NIMVS, sedated and moved to ICU. Hopefully we will avoid intubation but she may need intubation if refractory to current treatments.      Clinical Impression  Pt admitted with fall with respiratory issues and L1 fx. Pt currently with functional limitations due to the deficits listed below (see PT Problem List). May need NHP with therapy to get stronger before going home.  Pt will benefit from skilled PT to increase their independence and safety with mobility to allow discharge to the venue listed below.     Follow Up Recommendations SNF;Supervision/Assistance - 24 hour    Equipment Recommendations  None recommended by PT    Recommendations for Other Services       Precautions / Restrictions Precautions Precautions: Fall Required Braces or Orthoses: Other Brace/Splint Other Brace/Splint: CAM boot right LE and right wrist Futura splint Restrictions Weight Bearing Restrictions: Yes RUE Weight Bearing: Weight bearing as tolerated RLE Weight Bearing: Weight bearing as tolerated      Mobility  Bed Mobility Overal bed mobility: Needs Assistance;+2 for physical assistance Bed Mobility: Supine to Sit     Supine to sit: Min assist;+2 for physical assistance     General bed mobility comments: Pt needed assist for LEs and elevation of trunk.  Placed CAM boot on pts right foot prior to mobilizing.    Transfers Overall transfer level: Needs assistance Equipment used: Straight cane Transfers: Sit to/from Stand Sit to Stand: Mod  assist;+2 physical assistance         General transfer comment: Pt needed assist for sit to stand as well as cues for hand placement. limited by right ankle pain.  Pt unable to take steps forward but did side step to her right to [facility].  Excruciating pain in right ankle per pt that shot up her leg.  Pt assisted back to sitting due to this pain as well as pt unsteady.  Placed bed in 45 degree chair position to encourage upright position as pt refused to sit up in chair.    Ambulation/Gait                Stairs            Wheelchair Mobility    Modified Rankin (Stroke Patients Only)       Balance Overall balance assessment: Needs assistance;History of Falls Sitting-balance support: No upper extremity supported;Feet supported Sitting balance-Leahy Scale: Poor Sitting balance - Comments: Needed UE support to sit EOB.    Standing balance support: Single extremity supported;During functional activity Standing balance-Leahy Scale: Poor Standing balance comment: Pt needed incr support to stand other than cane.  Required +2 support for safety in standing.                              Pertinent Vitals/Pain Pain Assessment: 0-10 Pain Score: 10-Worst pain ever Pain Location: right ankle and back Pain Descriptors / Indicators: Shooting;Sharp;Penetrating;Grimacing Pain Intervention(s): Monitored during session;Premedicated before session;Limited activity within patient's tolerance;Repositioned HR 78 bpm, 100% O2 with 2.5LO2, 97/63  BP.     Home Living Family/patient expects to be discharged to:: Private residence Living Arrangements: Spouse/significant other Available Help at Discharge: Family;Available 24 hours/day Type of Home: House       Home Layout: One level Home Equipment: Wheelchair - manual;Other (comment);Cane - single point;Walker - 2 wheels;Bedside commode;Shower seat (2LO2)      Prior Function Level of Independence: Independent with assistive  device(s) (Pt states she walked with cane prior to this fall)               Hand Dominance        Extremity/Trunk Assessment   Upper Extremity Assessment: Defer to OT evaluation           Lower Extremity Assessment: RLE deficits/detail         Communication   Communication: No difficulties  Cognition Arousal/Alertness: Awake/alert Behavior During Therapy: WFL for tasks assessed/performed Overall Cognitive Status: Within Functional Limits for tasks assessed                      General Comments      Exercises        Assessment/Plan    PT Assessment Patient needs continued PT services  PT Diagnosis Generalized weakness;Acute pain   PT Problem List Decreased activity tolerance;Decreased balance;Decreased mobility;Decreased knowledge of use of DME;Decreased safety awareness;Decreased knowledge of precautions;Decreased strength;Pain  PT Treatment Interventions DME instruction;Gait training;Functional mobility training;Therapeutic activities;Therapeutic exercise;Balance training;Patient/family education   PT Goals (Current goals can be found in the Care Plan section) Acute Rehab PT Goals Patient Stated Goal: to go home PT Goal Formulation: With patient Time For Goal Achievement: 11/24/13 Potential to Achieve Goals: Good    Frequency Min 3X/week   Barriers to discharge        Co-evaluation               End of Session Equipment Utilized During Treatment: Gait belt;Oxygen;Other (comment) (CAM boot and wrist splint) Activity Tolerance: Patient limited by fatigue;Patient limited by pain Patient left: in bed;with call bell/phone within reach Nurse Communication: Mobility status;Patient requests pain meds         Time: 1021-1048 PT Time Calculation (min): 27 min   Charges:   PT Evaluation $Initial PT Evaluation Tier I: 1 Procedure PT Treatments $Therapeutic Activity: 8-22 mins   PT G Codes:          INGOLD,[name] Fitzpatrick 11-27-2013, 11:52  AM Audree Camel Acute Rehabilitation [phone] (pager)

## 2013-11-10 NOTE — Progress Notes (Addendum)
[facility] PROGRESS NOTE  [name] Fitzpatrick IRC:[id number] DOB: 1955-08-01 DOA: 11/07/2013 PCP: [name], MD  Assessment/Plan: Principal Problem:   Compression fracture of L1 lumbar vertebra Active Problems:   NECK PAIN, CHRONIC   COPD (chronic obstructive pulmonary disease)   Post traumatic stress disorder (PTSD)   Back pain, acute   Fall at home    Acute resp distress secondary to large component of VCD  COPD  Improved with IV sedation with Ativan,racemic epinephrine, Xopenex nebulizer  Placed on BiPAP, transferred to ICU , currently on nasal cannula Discussed with Dr. Alva Garnet, [place] to transfer to telemetry if she needs intervention of her back she may well need intubation as safety precaution if unable to tolerate conscious sedation Compression fracture of L1  Patient's pain seems to be uncontrolled  Interventional radiology to plan further procedures , patient appears to be medically stable at this time We'll keep her n.p.o. tomorrow. for expected vertebroplasty versus kyphoplasty  MRI shows acute endplate compression fracture at L1  Also hold  Lovenox after midnight Right ankle ventral plate fracture  Previous History of orthopedic surgery at [facility] Dr. [name] to see the patient  He recommends,OK for patient to mobilize with WBAT on the right ankle in a CAM boot.  2. F/u as outpatient with [facility] for elective revision of arthrodesis  Chronic pain syndrome  Patient currently on methadone, IV Dilaudid, IV Robaxin , highly tolerant to narcotic medications, maintain on pulse oximetry if the patient is receiving high-dose narcotics Type 2 diabetes  Hemoglobin A1c on 8/19 was 5.5  Continue sliding scale insulin Hypothyroidism continue Synthroid, TSH normal    Code Status: full  Family Communication: family updated about patient's clinical progress  Disposition Plan: Transfer to telemetry, notify interventional radiology patient is  stable Will need SNF at discharge  Brief narrative:  58 yo WF with hx of intubations for stridor, copd, chronic pain on methadone, fibromyalgia, anxiety disorder, suicide attempts who fell and was admitted 8/23 post fall with lumbar compression fx. she was supposed to be have kyphoplasty/vertebroplasty done however, She developed acute resp distress 8/24, PCCM came to room at Rapid response team request. She was placed on NIMVS, sedated and moved to ICU. Stable today from a pulmonary standpoint and being transferred back to telemetry. Consultants:  Critical care  Dementia with radiology  Orthopedics Procedures:  None Antibiotics:  None   HPI/Subjective: Breathing comfortably, less anxious, no complaints  Objective: Filed Vitals:   11/10/13 0500 11/10/13 0600 11/10/13 0700 11/10/13 0830  BP: 109/57 111/53 105/61   Pulse: 50 42 46   Temp:      TempSrc:      Resp: 11 11 9    Height:      Weight:      SpO2: 95% 100% 97% 99%    Intake/Output Summary (Last 24 hours) at 11/10/13 1204 Last data filed at 11/10/13 0700  Gross per 24 hour  Intake 351.67 ml  Output   2250 ml  Net -1898.33 ml    Exam:  General: alert & oriented x 3 In NAD  Cardiovascular: RRR, nl S1 s2  Respiratory: Decreased breath sounds at the bases, scattered rhonchi, no crackles  Abdomen: soft +BS NT/ND, no masses palpable  Extremities: No cyanosis and no edema      Data Reviewed: Basic Metabolic Panel:  Recent Labs Lab 11/04/13 1703 11/08/13 0020 11/08/13 0634  NA 139 138 139  K 4.6 4.0 4.2  CL 102 97 101  CO2 28 30 29   GLUCOSE 82 80 89  BUN 16 18 14   CREATININE 0.79 0.80 0.74  CALCIUM 9.9 9.4 9.4    Liver Function Tests:  Recent Labs Lab 11/08/13 0020  AST 20  ALT 13  ALKPHOS 69  BILITOT 0.2*  PROT 7.3  ALBUMIN 3.8   No results found for this basename: LIPASE, AMYLASE,  in the last 168 hours No results found for this basename: AMMONIA,  in the last 168 hours  CBC:  Recent  Labs Lab 11/08/13 0020 11/08/13 0634  WBC 8.5 5.9  NEUTROABS 5.5  --   HGB 11.0* 10.5*  HCT 32.8* 31.7*  MCV 86.8 86.4  PLT 181 149*    Cardiac Enzymes:  Recent Labs Lab 11/08/13 0020 11/09/13 1155  TROPONINI <0.30 <0.30   BNP (last 3 results) No results found for this basename: PROBNP,  in the last 8760 hours   CBG:  Recent Labs Lab 11/09/13 1208 11/09/13 1549 11/09/13 1933 11/09/13 2358 11/10/13 0755  GLUCAP 96 95 168* 138* 129*    No results found for this or any previous visit (from the past 240 hour(s)).   Studies: Dg Lumbar Spine Complete  11/08/2013   CLINICAL DATA:  58 year old female status post fall with pain. Prior surgery. Initial encounter.  EXAM: LUMBAR SPINE - COMPLETE 4+ VIEW  COMPARISON:  CT Abdomen and Pelvis 09/01/2010.  Chest CT 09/14/2011.  FINDINGS: Sequelae of posterior and interbody fusion re- identified in the lower lumbar spine. Lumbar segmentation appears to be normal, the operated levels are L4-L5 and L5-S1. New L1 compression fracture since 2013, primarily with superior endplate deformity. Mild loss of height. No evidence of retropulsed bone. Other lumbar levels appear stable and intact. Grossly intact visualized lower thoracic levels. Sacral ala and SI joints appear grossly intact.  IMPRESSION: 1. L1 compression fracture new since 2013, suspicious for acute injury in this setting. If specific therapy such as vertebroplasty is desired, lumbar MRI or whole-body bone scan would best confirm acuity. 2. Otherwise stable postoperative appearance of the lumbar spine.   Electronically Signed   By: Lars Pinks M.D.   On: 11/08/2013 01:42   Dg Pelvis 1-2 Views  11/08/2013   CLINICAL DATA:  Fall with back pain  EXAM: PELVIS - 1-2 VIEW  COMPARISON:  None.  FINDINGS: No evidence of pelvic ring fracture or diastasis. No evidence of hip fracture or dislocation. Irregularity of the bilateral anterior iliac wings consistent with harvest sites in this patient with  lower lumbar fusions.  IMPRESSION: No acute osseous findings.   Electronically Signed   By: Jorje Guild M.D.   On: 11/08/2013 01:36   Dg Wrist Complete Right  11/08/2013   CLINICAL DATA:  Fall  EXAM: RIGHT WRIST - COMPLETE 3+ VIEW  COMPARISON:  None.  FINDINGS: There is no evidence of fracture or dislocation. There is no evidence of arthropathy or other focal bone abnormality. Soft tissues are unremarkable.  IMPRESSION: Negative.   Electronically Signed   By: Jeannine Boga M.D.   On: 11/08/2013 01:49   Dg Ankle Complete Right  11/08/2013   CLINICAL DATA:  Fall.  EXAM: RIGHT ANKLE - COMPLETE 3+ VIEW  COMPARISON:  07/01/2011  FINDINGS: Status post attempted tibiotalar arthrodesis with plate and lag screw traversing the joint. Progressive lucency around the hardware, with an anterior plate fracture that is new. No osseous erosion or periosteal reaction strongly suspicious for infection. There has also been at a subtalar arthrodesis which appears complete.  Lateral calcaneus plate and screw fixation for remote injury. No acute osseous fracture. No dislocation.  IMPRESSION: 1. Failed ankle arthrodesis with ventral plate fracture that is new from 2013. 2. Complete subtalar arthrodesis. 3. No acute osseous fracture.   Electronically Signed   By: Jorje Guild M.D.   On: 11/08/2013 01:47   Mr Lumbar Spine Wo Contrast  11/08/2013   CLINICAL DATA:  Severe low back pain status post fall yesterday. L1 compression fracture.  EXAM: MRI LUMBAR SPINE WITHOUT CONTRAST  TECHNIQUE: Multiplanar, multisequence MR imaging of the lumbar spine was performed. No intravenous contrast was administered.  COMPARISON:  Abdominal CT 09/01/2010. Lumbar spine radiographs 11/08/2013.  FINDINGS: Examination is mildly motion degraded.  Five lumbar type vertebral bodies are assumed. There are stable postsurgical changes status post L4 through S1 PLIF. As demonstrated on the earlier radiographs, there is a new mild superior endplate  compression deformity at L1. This results in approximately 15% loss of vertebral body height. There is associated marrow edema within the superior endplate. There is 3 mm of osseous retropulsion. The posterior elements are intact. No other fractures are demonstrated. The alignment is normal.  The conus medullaris extends to the L2-3 level and appears normal. No acute paraspinal abnormalities are seen. There is chronic fatty atrophy of the erector spinae musculature.  There is mild disc degeneration in the lower thoracic spine. The discs are well hydrated with maintained height at L1-2, L2-3 and L3-4. There is no evidence of spinal stenosis or nerve root encroachment.  IMPRESSION: 1. Acute mild superior endplate compression deformity at L1 with minimal associated osseous retropulsion. The posterior elements are intact. 2. No other acute osseous findings or malalignment. 3. Stable postsurgical changes status post L4 through S1 fusion.   Electronically Signed   By: Camie Patience M.D.   On: 11/08/2013 17:16   Dg Chest Port 1 View  11/10/2013   CLINICAL DATA:  Shortness of breath and productive cough, post back surgery, history diabetes, CHF, COPD  EXAM: PORTABLE CHEST - 1 VIEW  COMPARISON:  Portable exam 0846 hr compared 11/09/2013  FINDINGS: Borderline enlargement of cardiac silhouette.  Slight pulmonary vascular congestion.  Mediastinal contours normal.  Bibasilar atelectasis.  No pulmonary infiltrate, pleural effusion or pneumothorax.  IMPRESSION: Bibasilar atelectasis.   Electronically Signed   By: Lavonia Dana M.D.   On: 11/10/2013 09:00   Dg Chest Port 1 View  11/09/2013   CLINICAL DATA:  Shortness of breath.  EXAM: PORTABLE CHEST - 1 VIEW  COMPARISON:  Aug 14, 2012.  FINDINGS: Stable cardiomediastinal silhouette. No pneumothorax or pleural effusion is noted. Increased central pulmonary vascular congestion is noted. Linear densities are noted in both lung bases most consistent with subsegmental atelectasis.  Bony thorax intact.  IMPRESSION: Increased central pulmonary vascular congestion. Increased bilateral basilar linear densities are noted consistent with subsegmental atelectasis.   Electronically Signed   By: Sabino Dick M.D.   On: 11/09/2013 08:48    Scheduled Meds: . budesonide  0.25 mg Nebulization BID  . enoxaparin (LOVENOX) injection  40 mg Subcutaneous Q24H  . fluticasone  2 spray Each Nare Daily  . ipratropium-albuterol  3 mL Nebulization 3 times per day  . levothyroxine  224 mcg Oral QAC breakfast  . methadone  10 mg Oral TID  . sertraline  100 mg Oral Daily  . traZODone  150 mg Oral QHS   Continuous Infusions: . sodium chloride 20 mL/hr at 11/10/13 0700    Principal Problem:  Compression fracture of L1 lumbar vertebra Active Problems:   NECK PAIN, CHRONIC   COPD (chronic obstructive pulmonary disease)   Post traumatic stress disorder (PTSD)   Back pain, acute   Fall at home    Time spent: 69 minutes   [facility] Pager [phone]. If 7PM-7AM, please contact night-coverage at [email or web address], password [facility] 11/10/2013, 12:04 PM  LOS: 3 days

## 2013-11-10 NOTE — Progress Notes (Signed)
Informed elink MD that paitent's heart rate is in the 40's and 50's. Patient is sleeping at this time and has recently taken pain medication. Patient hemodynamically stable- no further orders at this time. Will continue to monitor patient.

## 2013-11-10 NOTE — Consult Note (Signed)
PULMONARY / CRITICAL CARE MEDICINE   Name: [name] Fitzpatrick MRN: [id number] DOB: [birth year]    ADMISSION DATE:  11/07/2013 CONSULTATION DATE: 8/24  REFERRING MD :  FPTS  CHIEF COMPLAINT:  Resp distress  INITIAL PRESENTATION:  [place] and had lumbar compression fx.  STUDIES:    SIGNIFICANT EVENTS: 8/24 VCD tx to ICU 8/25 Much improved. Transferred back to med-surg. PCCM s/o  SUBJECTIVE:  No new complaints. No distress   VITAL SIGNS: Temp:  [98.2 F (36.8 C)-98.8 F (37.1 C)] 98.3 F (36.8 C) (08/25 1301) Pulse Rate:  [42-105] 54 (08/25 1300) Resp:  [6-28] 9 (08/25 1300) BP: (91-122)/(41-91) 101/46 mmHg (08/25 1300) SpO2:  [87 %-100 %] 98 % (08/25 1422) HEMODYNAMICS:   VENTILATOR SETTINGS:   INTAKE / OUTPUT:  Intake/Output Summary (Last 24 hours) at 11/10/13 1507 Last data filed at 11/10/13 1400  Gross per 24 hour  Intake 911.67 ml  Output   2150 ml  Net -1238.33 ml    PHYSICAL EXAMINATION: General:  Obese wf in distress Neuro:  Anxious, unable to speak HEENT:  Large VCD component, no jvd Cardiovascular:  HSR RRR Lungs:  Poor air movement but clear Abdomen: Obese + bs Musculoskeletal:  Old scars rt ankle, rt wrist wrap in place Skin:  warm  LABS:  CBC  Recent Labs Lab 11/08/13 0020 11/08/13 0634  WBC 8.5 5.9  HGB 11.0* 10.5*  HCT 32.8* 31.7*  PLT 181 149*   Coag's  Recent Labs Lab 11/08/13 0020  APTT 29  INR 0.96   BMET  Recent Labs Lab 11/04/13 1703 11/08/13 0020 11/08/13 0634  NA 139 138 139  K 4.6 4.0 4.2  CL 102 97 101  CO2 28 30 29   BUN 16 18 14   CREATININE 0.79 0.80 0.74  GLUCOSE 82 80 89   Electrolytes  Recent Labs Lab 11/04/13 1703 11/08/13 0020 11/08/13 0634  CALCIUM 9.9 9.4 9.4   Sepsis Markers No results found for this basename: LATICACIDVEN, PROCALCITON, O2SATVEN,  in the last 168 hours ABG  Recent Labs Lab 11/09/13 1121  PHART 7.587*  PCO2ART 33.2*  PO2ART 184.0*   Liver Enzymes  Recent  Labs Lab 11/08/13 0020  AST 20  ALT 13  ALKPHOS 69  BILITOT 0.2*  ALBUMIN 3.8   Cardiac Enzymes  Recent Labs Lab 11/08/13 0020 11/09/13 1155  TROPONINI <0.30 <0.30   Glucose  Recent Labs Lab 11/09/13 1208 11/09/13 1549 11/09/13 1933 11/09/13 2358 11/10/13 0755 11/10/13 1243  GLUCAP 96 95 168* 138* 129* 125*    Imaging Dg Chest Port 1 View  11/09/2013   CLINICAL DATA:  Shortness of breath.  EXAM: PORTABLE CHEST - 1 VIEW  COMPARISON:  Aug 14, 2012.  FINDINGS: Stable cardiomediastinal silhouette. No pneumothorax or pleural effusion is noted. Increased central pulmonary vascular congestion is noted. Linear densities are noted in both lung bases most consistent with subsegmental atelectasis. Bony thorax intact.  IMPRESSION: Increased central pulmonary vascular congestion. Increased bilateral basilar linear densities are noted consistent with subsegmental atelectasis.   Electronically Signed   By: Sabino Dick M.D.   On: 11/09/2013 08:48     ASSESSMENT / PLAN:  PULMONARY A: Acute resp distress secondary to large component of VCD COPD, doubt exacerbation P:   Bronchodilator regimen clarified DC NPPV OK to transfer back to med-surg [facility] will continue to oversee all aspects of her medical care  PCCM will sign off. Please call if we can be of further assistance  Merton Border, MD ;  PCCM service Mobile [phone].  After 5:30 PM or weekends, call [phone]

## 2013-11-11 ENCOUNTER — Inpatient Hospital Stay (HOSPITAL_COMMUNITY): Payer: Medicare FFS

## 2013-11-11 LAB — CBC
HCT: 34.7 % — ABNORMAL LOW (ref 36.0–46.0)
Hemoglobin: 11.6 g/dL — ABNORMAL LOW (ref 12.0–15.0)
MCH: 29.4 pg (ref 26.0–34.0)
MCHC: 33.4 g/dL (ref 30.0–36.0)
MCV: 88.1 fL (ref 78.0–100.0)
Platelets: 186 K/uL (ref 150–400)
RBC: 3.94 MIL/uL (ref 3.87–5.11)
RDW: 13.2 % (ref 11.5–15.5)
WBC: 8.8 K/uL (ref 4.0–10.5)

## 2013-11-11 LAB — PROTIME-INR
INR: 1.07 (ref 0.00–1.49)
Prothrombin Time: 13.9 seconds (ref 11.6–15.2)

## 2013-11-11 LAB — BASIC METABOLIC PANEL
ANION GAP: 13 (ref 5–15)
BUN: 15 mg/dL (ref 6–23)
CHLORIDE: 103 meq/L (ref 96–112)
CO2: 25 mEq/L (ref 19–32)
Calcium: 9.2 mg/dL (ref 8.4–10.5)
Creatinine, Ser: 0.68 mg/dL (ref 0.50–1.10)
GFR calc non Af Amer: >90 mL/min (ref >90)
Glucose, Bld: 85 mg/dL (ref 70–99)
POTASSIUM: 4.2 meq/L (ref 3.7–5.3)
SODIUM: 141 meq/L (ref 137–147)

## 2013-11-11 LAB — APTT: aPTT: 26 s (ref 24–37)

## 2013-11-11 MED ORDER — LORAZEPAM 2 MG/ML IJ SOLN
1.0000 mg | Freq: Three times a day (TID) | INTRAMUSCULAR | Status: DC | PRN
  Administered 2013-11-12: 1 mg via INTRAVENOUS
  Filled 2013-11-11: qty 1

## 2013-11-11 MED ORDER — HYDROMORPHONE HCL PF 1 MG/ML IJ SOLN
INTRAMUSCULAR | Status: AC | PRN
  Administered 2013-11-11 (×2): 1 mg

## 2013-11-11 MED ORDER — SERTRALINE HCL 100 MG PO TABS
200.0000 mg | ORAL_TABLET | Freq: Every day | ORAL | Status: DC
  Administered 2013-11-12 – 2013-11-16 (×5): 200 mg via ORAL
  Filled 2013-11-11 (×5): qty 2

## 2013-11-11 MED ORDER — HYDROMORPHONE HCL PF 1 MG/ML IJ SOLN
INTRAMUSCULAR | Status: AC
  Filled 2013-11-11: qty 4

## 2013-11-11 MED ORDER — FENTANYL CITRATE 0.05 MG/ML IJ SOLN
INTRAMUSCULAR | Status: AC | PRN
  Administered 2013-11-11 (×3): 25 ug via INTRAVENOUS

## 2013-11-11 MED ORDER — SERTRALINE HCL 100 MG PO TABS
100.0000 mg | ORAL_TABLET | Freq: Every day | ORAL | Status: DC
  Administered 2013-11-11 – 2013-11-15 (×5): 100 mg via ORAL
  Filled 2013-11-11 (×8): qty 1

## 2013-11-11 MED ORDER — MIDAZOLAM HCL 2 MG/2ML IJ SOLN
INTRAMUSCULAR | Status: AC | PRN
  Administered 2013-11-11 (×3): 1 mg via INTRAVENOUS

## 2013-11-11 MED ORDER — FENTANYL CITRATE 0.05 MG/ML IJ SOLN
INTRAMUSCULAR | Status: AC
  Filled 2013-11-11: qty 4

## 2013-11-11 MED ORDER — BUPIVACAINE HCL (PF) 0.25 % IJ SOLN
INTRAMUSCULAR | Status: AC
  Filled 2013-11-11: qty 30

## 2013-11-11 MED ORDER — SODIUM CHLORIDE 0.9 % IV BOLUS (SEPSIS)
1000.0000 mL | Freq: Once | INTRAVENOUS | Status: AC
  Administered 2013-11-11: 1000 mL via INTRAVENOUS

## 2013-11-11 MED ORDER — IPRATROPIUM-ALBUTEROL 0.5-2.5 (3) MG/3ML IN SOLN
3.0000 mL | Freq: Three times a day (TID) | RESPIRATORY_TRACT | Status: DC
  Administered 2013-11-13 – 2013-11-14 (×2): 3 mL via RESPIRATORY_TRACT
  Filled 2013-11-11 (×12): qty 3

## 2013-11-11 MED ORDER — POLYETHYLENE GLYCOL 3350 17 G PO PACK
17.0000 g | PACK | Freq: Every day | ORAL | Status: DC
  Administered 2013-11-11 – 2013-11-12 (×2): 17 g via ORAL
  Filled 2013-11-11 (×2): qty 1

## 2013-11-11 MED ORDER — SODIUM CHLORIDE 0.9 % IV SOLN
INTRAVENOUS | Status: AC
  Administered 2013-11-11: 12:00:00 via INTRAVENOUS

## 2013-11-11 MED ORDER — SENNOSIDES-DOCUSATE SODIUM 8.6-50 MG PO TABS
2.0000 | ORAL_TABLET | Freq: Every day | ORAL | Status: DC
  Administered 2013-11-11: 2 via ORAL
  Filled 2013-11-11: qty 2

## 2013-11-11 MED ORDER — IOHEXOL 300 MG/ML  SOLN
50.0000 mL | Freq: Once | INTRAMUSCULAR | Status: AC | PRN
  Administered 2013-11-11: 3 mL

## 2013-11-11 MED ORDER — ONDANSETRON HCL 4 MG/2ML IJ SOLN
INTRAMUSCULAR | Status: AC
  Filled 2013-11-11: qty 2

## 2013-11-11 MED ORDER — FLUMAZENIL 0.5 MG/5ML IV SOLN
INTRAVENOUS | Status: AC
  Filled 2013-11-11: qty 5

## 2013-11-11 MED ORDER — FLEET ENEMA 7-19 GM/118ML RE ENEM
1.0000 | ENEMA | Freq: Every day | RECTAL | Status: DC | PRN
  Filled 2013-11-11: qty 1

## 2013-11-11 MED ORDER — ENOXAPARIN SODIUM 40 MG/0.4ML ~~LOC~~ SOLN
40.0000 mg | SUBCUTANEOUS | Status: AC
  Administered 2013-11-12: 40 mg via SUBCUTANEOUS
  Filled 2013-11-11: qty 0.4

## 2013-11-11 MED ORDER — MIDAZOLAM HCL 2 MG/2ML IJ SOLN
INTRAMUSCULAR | Status: AC
  Filled 2013-11-11: qty 6

## 2013-11-11 MED ORDER — NALOXONE HCL 0.4 MG/ML IJ SOLN
INTRAMUSCULAR | Status: AC
  Filled 2013-11-11: qty 1

## 2013-11-11 MED ORDER — TOBRAMYCIN SULFATE 1.2 G IJ SOLR
INTRAMUSCULAR | Status: AC
  Filled 2013-11-11: qty 1.2

## 2013-11-11 NOTE — Procedures (Signed)
S/P L1 balloon KP with biopsy 

## 2013-11-11 NOTE — Progress Notes (Signed)
RT completed an assessment on pt. Pt. Prefers not to be awaken at [facility] for tx. And pt. Does not take tx. At home. Pt. Schedule changed to TID.

## 2013-11-11 NOTE — Progress Notes (Signed)
Physical Therapy Treatment Patient Details Name: [name] Fitzpatrick MRN: [id number] DOB: Feb 19, 1956 Today's Date: 11/11/2013    History of Present Illness 58 yo WF with hx of intubations for stridor, copd, chronic pain on methadone, fibromyalgia, anxiety disorder, suicide attempts who fell and was admitted 8/23 post fall with lumbar compression fx. She developed acute resp distress 8/24, PCCM came to room at Rapid response team request. She was placed on NIMVS, sedated and moved to ICU. Hopefully we will avoid intubation but she may need intubation if refractory to current treatments. S/p L1 balloon kyphoplasty 11/11/13.    PT Comments    Patient progressing well with mobility. Tolerated short distance ambulation to chair with Min guard assist for safety and RW management. Education provided on back precautions. Mobility limited due to pain in back and shooting pains down RLE. Able to get into position to relieve symptoms while sitting in chair. Encourage increasing ambulation distance next session. Will continue to follow and progress as tolerated.    Follow Up Recommendations  SNF;Supervision/Assistance - 24 hour     Equipment Recommendations  None recommended by PT    Recommendations for Other Services OT consult     Precautions / Restrictions Precautions Precautions: Fall;Back Precaution Booklet Issued: No Precaution Comments: Education provided on back precautions. Required Braces or Orthoses: Other Brace/Splint Other Brace/Splint: CAM boot right LE and right wrist Futura splint Restrictions Weight Bearing Restrictions: No RUE Weight Bearing: Weight bearing as tolerated RLE Weight Bearing: Weight bearing as tolerated    Mobility  Bed Mobility Overal bed mobility: Needs Assistance Bed Mobility: Rolling;Sidelying to Sit Rolling: Min guard Sidelying to sit: Min assist;HOB elevated       General bed mobility comments: Pt needed assist for elevation of trunk. VC for log  roll technique and emphasize on back precautions. VC for hand placement/technique. Placed CAM boot on pts right foot prior to mobilizing. BP taken in seated 84/39. Repeated within 3 minutes, 96/52. Dizziness reported however improved with continued sitting.  Transfers Overall transfer level: Needs assistance Equipment used: Rolling walker (2 wheeled) Transfers: Sit to/from Stand Sit to Stand: Min assist         General transfer comment: VC for hand placement and technique. Min A required to help with bottom/hips. Stable upon standing with RW for support. BP taken in standinf 100/52.   Ambulation/Gait Ambulation/Gait assistance: Min guard Ambulation Distance (Feet): 5 Feet Assistive device: Rolling walker (2 wheeled) Gait Pattern/deviations: Step-to pattern;Decreased stride length Gait velocity: Decreased   General Gait Details: Tolerated taking a few steps to chair. Slow, unsteady gait. Pain in back limiting distance. VC for RW management.    Stairs            Wheelchair Mobility    Modified Rankin (Stroke Patients Only)       Balance Overall balance assessment: History of Falls;Needs assistance Sitting-balance support: Feet supported Sitting balance-Leahy Scale: Fair Sitting balance - Comments: Needed 1 UE support to sit EOB.    Standing balance support: During functional activity;Bilateral upper extremity supported Standing balance-Leahy Scale: Poor Standing balance comment: Needed BUE support and use of RW to maintain stability/balance in standing.                    Cognition Arousal/Alertness: Awake/alert Behavior During Therapy: WFL for tasks assessed/performed Overall Cognitive Status: Within Functional Limits for tasks assessed  Exercises General Exercises - Lower Extremity Ankle Circles/Pumps: Left;10 reps;Seated Long Arc Quad: Both;5 reps;Seated    General Comments        Pertinent Vitals/Pain Pain  Assessment: 0-10 Pain Location: Reports shooting pain in back down RLE PRN. Pain Descriptors / Indicators: Shooting;Sharp;Grimacing Pain Intervention(s): Monitored during session;Repositioned    Home Living                      Prior Function            PT Goals (current goals can now be found in the care plan section) Progress towards PT goals: Progressing toward goals    Frequency  Min 3X/week    PT Plan Current plan remains appropriate    Co-evaluation             End of Session Equipment Utilized During Treatment: Gait belt;Oxygen;Other (comment) Activity Tolerance: Patient limited by pain Patient left: in chair;with chair alarm set;with call bell/phone within reach     Time: 1523-1550 PT Time Calculation (min): 27 min  Charges:  $Therapeutic Activity: 23-37 mins                    G CodesHaskell Riling [place], PT, DPT [phone]   11/11/2013, 4:36 PM

## 2013-11-11 NOTE — Sedation Documentation (Signed)
BP no accurately reading- will readjust cuff and retake BP.

## 2013-11-11 NOTE — Clinical Social Work Note (Signed)
Met with patient who was somewhat tearful upon my entrance- she shared with CSW that she is upset because her husband did not come today for her procedure even though he had said he was coming- she is disappointed and feels hurt by how he has been treating her since the fall- support and encouragement provided. She was open to discussing plans for SNF at d/c- states she has been to [place] in the past and would like to go back there- her mother is also a patient there so this would be an added benefit of going there- CSW will complete FL2 and assessment and proceed with SNF plans and advise-  Eduard Clos, MSW, [place]

## 2013-11-11 NOTE — Discharge Instructions (Signed)
1.No stooping,bending or lifting  More than 10 lbs for 2 weeks. 2. Use walker to ambulate  . 3.RTC in 2 weeks

## 2013-11-11 NOTE — Progress Notes (Signed)
PATIENT DETAILS Name: [name] Fitzpatrick Age: 58 y.o. Sex: female Date of Birth: [birth year] Admit Date: 11/07/2013 Admitting Physician Phillips Grout, MD FXT:[id number] Simpson, MD  Subjective: Still drowsy after kyphoplasty. But awake, answers all questions appropriately. Still complains of back pain.  Assessment/Plan: Principal Problem:   Compression fracture of L1 lumbar vertebra - Secondary to a mechanical fall. She was admitted, pain control was attempted with narcotics, however because of persistent pain interventional radiology was consulted. MRI of the lumbosacral spine confirmed acute mild superior endplate compression deformity at L1. Patient subsequently underwent kyphoplasty with biopsy on 8/26. Will optimize pain regimen, follow biopsy results.  Active Problems: Acute respiratory distress- secondary to vocal cord dysfunction exacerbation - Occurred on 8/24, briefly transferred ICU and required BiPAP. Significantly improved with just supportive care.  COPD - Lungs clear, stable. Acute respiratory distress on 8/24 secondary to vocal cord dysfunction. - Continue with budesonide nebs, continue with scheduled doing meds. Follow clinically.  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 Coral Else for patient to mobilize with WBAT on the right ankle in a CAM boot. Recommended to followup with [facility] for elective revision of arthrodesis  Chronic pain syndrome/Fibromyalgia/Chronic Low Back Pain - On chronic methadone treatment as an outpatient. But also getting IV Dilaudid via inpatient. Will slowly taper off IV Dilaudid.  Hypothyroidism  -continue Synthroid, TSH normal   Anxiety/depression - Continue with Zoloft and trazodone  Disposition: Remain inpatient  DVT Prophylaxis: Prophylactic Lovenox - to resume on 8/27  Code Status: Full code   Family Communication None at  bedside  Procedures:  Kyphoplasty-8/26  CONSULTS:  orthopedic surgery and IR  MEDICATIONS: Scheduled Meds: . budesonide  0.25 mg Nebulization BID  . fluticasone  2 spray Each Nare Daily  . ipratropium-albuterol  3 mL Nebulization 3 times per day  . levothyroxine  224 mcg Oral QAC breakfast  . methadone  10 mg Oral TID  . sertraline  100 mg Oral QHS  . [START ON 11/12/2013] sertraline  200 mg Oral Daily  . traZODone  150 mg Oral QHS  . vancomycin  1,000 mg Intravenous On Call   Continuous Infusions: . sodium chloride 20 mL/hr at 11/10/13 1710   PRN Meds:.albuterol, HYDROmorphone (DILAUDID) injection, methocarbamol (ROBAXIN) IV, ondansetron (ZOFRAN) IV, ondansetron, polyethylene glycol  Antibiotics: Anti-infectives   Start     Dose/Rate Route Frequency Ordered Stop   11/11/13 0800  vancomycin (VANCOCIN) IVPB 1000 mg/200 mL premix     1,000 mg 200 mL/hr over 60 Minutes Intravenous On call 11/10/13 1447 11/12/13 0800   11/11/13 0758  tobramycin (NEBCIN) 1.2 G powder    Comments:  Michela Pitcher   : cabinet override      11/11/13 0758 11/11/13 0815       PHYSICAL EXAM: Vital signs in last 24 hours: Filed Vitals:   11/11/13 1215 11/11/13 1230 11/11/13 1300 11/11/13 1410  BP: 96/50 96/44 88/47    Pulse: 62 57 58   Temp: 98.2 F (36.8 C) 98 F (36.7 C) 98 F (36.7 C)   TempSrc: Oral Oral Oral   Resp: 16 16 16    Height:      Weight:      SpO2: 98% 96% 97% 99%    Weight change:  Filed Weights   11/08/13 0546 11/09/13 0631 11/10/13 2225  Weight: 95.936 kg (211 lb 8 oz) 95.255 kg (210 lb) 94.394 kg (208 lb 1.6 oz)  Body mass index is 38.05 kg/(m^2).   Gen Exam: Awake and alert-but slightly drowsy- with clear speech.  Neck: Supple, No JVD.   Chest: B/L Clear.   CVS: S1 S2 Regular, no murmurs.  Abdomen: soft, BS +, non tender, non distended.  Extremities: no edema, lower extremities warm to touch. Neurologic: Non Focal.   Skin: No Rash.   Wounds: N/A.    Intake/Output from previous day:  Intake/Output Summary (Last 24 hours) at 11/11/13 1418 Last data filed at 11/11/13 7106  Gross per 24 hour  Intake 151.67 ml  Output    400 ml  Net -248.33 ml     LAB RESULTS: CBC  Recent Labs Lab 11/08/13 0020 11/08/13 0634 11/11/13 0725  WBC 8.5 5.9 8.8  HGB 11.0* 10.5* 11.6*  HCT 32.8* 31.7* 34.7*  PLT 181 149* 186  MCV 86.8 86.4 88.1  MCH 29.1 28.6 29.4  MCHC 33.5 33.1 33.4  RDW 13.3 13.2 13.2  LYMPHSABS 2.2  --   --   MONOABS 0.5  --   --   EOSABS 0.2  --   --   BASOSABS 0.0  --   --     Chemistries   Recent Labs Lab 11/04/13 1703 11/08/13 0020 11/08/13 0634 11/11/13 0725  NA 139 138 139 141  K 4.6 4.0 4.2 4.2  CL 102 97 101 103  CO2 28 30 29 25   GLUCOSE 82 80 89 85  BUN 16 18 14 15   CREATININE 0.79 0.80 0.74 0.68  CALCIUM 9.9 9.4 9.4 9.2    CBG:  Recent Labs Lab 11/09/13 1549 11/09/13 1933 11/09/13 2358 11/10/13 0755 11/10/13 1243  GLUCAP 95 168* 138* 129* 125*    GFR Estimated Creatinine Clearance: 83 ml/min (by C-G formula based on Cr of 0.68).  Coagulation profile  Recent Labs Lab 11/08/13 0020 11/11/13 0725  INR 0.96 1.07    Cardiac Enzymes  Recent Labs Lab 11/08/13 0020 11/09/13 1155  TROPONINI <0.30 <0.30    No components found with this basename: POCBNP,  No results found for this basename: DDIMER,  in the last 72 hours No results found for this basename: HGBA1C,  in the last 72 hours No results found for this basename: CHOL, HDL, LDLCALC, TRIG, CHOLHDL, LDLDIRECT,  in the last 72 hours No results found for this basename: TSH, T4TOTAL, FREET3, T3FREE, THYROIDAB,  in the last 72 hours No results found for this basename: VITAMINB12, FOLATE, FERRITIN, TIBC, IRON, RETICCTPCT,  in the last 72 hours No results found for this basename: LIPASE, AMYLASE,  in the last 72 hours  Urine Studies No results found for this basename: UACOL, UAPR, USPG, UPH, UTP, UGL, UKET, UBIL, UHGB, UNIT,  UROB, ULEU, UEPI, UWBC, URBC, UBAC, CAST, CRYS, UCOM, BILUA,  in the last 72 hours  MICROBIOLOGY: No results found for this or any previous visit (from the past 240 hour(s)).  RADIOLOGY STUDIES/RESULTS: Dg Lumbar Spine Complete  11/08/2013   CLINICAL DATA:  58 year old female status post fall with pain. Prior surgery. Initial encounter.  EXAM: LUMBAR SPINE - COMPLETE 4+ VIEW  COMPARISON:  CT Abdomen and Pelvis 09/01/2010.  Chest CT 09/14/2011.  FINDINGS: Sequelae of posterior and interbody fusion re- identified in the lower lumbar spine. Lumbar segmentation appears to be normal, the operated levels are L4-L5 and L5-S1. New L1 compression fracture since 2013, primarily with superior endplate deformity. Mild loss of height. No evidence of retropulsed bone. Other lumbar levels appear stable and intact. Grossly intact visualized lower  thoracic levels. Sacral ala and SI joints appear grossly intact.  IMPRESSION: 1. L1 compression fracture new since 2013, suspicious for acute injury in this setting. If specific therapy such as vertebroplasty is desired, lumbar MRI or whole-body bone scan would best confirm acuity. 2. Otherwise stable postoperative appearance of the lumbar spine.   Electronically Signed   By: Lars Pinks M.D.   On: 11/08/2013 01:42   Dg Pelvis 1-2 Views  11/08/2013   CLINICAL DATA:  Fall with back pain  EXAM: PELVIS - 1-2 VIEW  COMPARISON:  None.  FINDINGS: No evidence of pelvic ring fracture or diastasis. No evidence of hip fracture or dislocation. Irregularity of the bilateral anterior iliac wings consistent with harvest sites in this patient with lower lumbar fusions.  IMPRESSION: No acute osseous findings.   Electronically Signed   By: Jorje Guild M.D.   On: 11/08/2013 01:36   Dg Wrist Complete Right  11/08/2013   CLINICAL DATA:  Fall  EXAM: RIGHT WRIST - COMPLETE 3+ VIEW  COMPARISON:  None.  FINDINGS: There is no evidence of fracture or dislocation. There is no evidence of arthropathy  or other focal bone abnormality. Soft tissues are unremarkable.  IMPRESSION: Negative.   Electronically Signed   By: Jeannine Boga M.D.   On: 11/08/2013 01:49   Dg Ankle Complete Right  11/08/2013   CLINICAL DATA:  Fall.  EXAM: RIGHT ANKLE - COMPLETE 3+ VIEW  COMPARISON:  07/01/2011  FINDINGS: Status post attempted tibiotalar arthrodesis with plate and lag screw traversing the joint. Progressive lucency around the hardware, with an anterior plate fracture that is new. No osseous erosion or periosteal reaction strongly suspicious for infection. There has also been at a subtalar arthrodesis which appears complete. Lateral calcaneus plate and screw fixation for remote injury. No acute osseous fracture. No dislocation.  IMPRESSION: 1. Failed ankle arthrodesis with ventral plate fracture that is new from 2013. 2. Complete subtalar arthrodesis. 3. No acute osseous fracture.   Electronically Signed   By: Jorje Guild M.D.   On: 11/08/2013 01:47   Mr Lumbar Spine Wo Contrast  11/08/2013   CLINICAL DATA:  Severe low back pain status post fall yesterday. L1 compression fracture.  EXAM: MRI LUMBAR SPINE WITHOUT CONTRAST  TECHNIQUE: Multiplanar, multisequence MR imaging of the lumbar spine was performed. No intravenous contrast was administered.  COMPARISON:  Abdominal CT 09/01/2010. Lumbar spine radiographs 11/08/2013.  FINDINGS: Examination is mildly motion degraded.  Five lumbar type vertebral bodies are assumed. There are stable postsurgical changes status post L4 through S1 PLIF. As demonstrated on the earlier radiographs, there is a new mild superior endplate compression deformity at L1. This results in approximately 15% loss of vertebral body height. There is associated marrow edema within the superior endplate. There is 3 mm of osseous retropulsion. The posterior elements are intact. No other fractures are demonstrated. The alignment is normal.  The conus medullaris extends to the L2-3 level and appears  normal. No acute paraspinal abnormalities are seen. There is chronic fatty atrophy of the erector spinae musculature.  There is mild disc degeneration in the lower thoracic spine. The discs are well hydrated with maintained height at L1-2, L2-3 and L3-4. There is no evidence of spinal stenosis or nerve root encroachment.  IMPRESSION: 1. Acute mild superior endplate compression deformity at L1 with minimal associated osseous retropulsion. The posterior elements are intact. 2. No other acute osseous findings or malalignment. 3. Stable postsurgical changes status post L4 through S1 fusion.  Electronically Signed   By: Camie Patience M.D.   On: 11/08/2013 17:16   Dg Chest Port 1 View  11/10/2013   CLINICAL DATA:  Shortness of breath and productive cough, post back surgery, history diabetes, CHF, COPD  EXAM: PORTABLE CHEST - 1 VIEW  COMPARISON:  Portable exam 0846 hr compared 11/09/2013  FINDINGS: Borderline enlargement of cardiac silhouette.  Slight pulmonary vascular congestion.  Mediastinal contours normal.  Bibasilar atelectasis.  No pulmonary infiltrate, pleural effusion or pneumothorax.  IMPRESSION: Bibasilar atelectasis.   Electronically Signed   By: Lavonia Dana M.D.   On: 11/10/2013 09:00   Dg Chest Port 1 View  11/09/2013   CLINICAL DATA:  Shortness of breath.  EXAM: PORTABLE CHEST - 1 VIEW  COMPARISON:  Aug 14, 2012.  FINDINGS: Stable cardiomediastinal silhouette. No pneumothorax or pleural effusion is noted. Increased central pulmonary vascular congestion is noted. Linear densities are noted in both lung bases most consistent with subsegmental atelectasis. Bony thorax intact.  IMPRESSION: Increased central pulmonary vascular congestion. Increased bilateral basilar linear densities are noted consistent with subsegmental atelectasis.   Electronically Signed   By: Sabino Dick M.D.   On: 11/09/2013 08:48    Oren Binet, MD  [facility] Pager:[phone]  If 7PM-7AM, please contact  night-coverage [email or web address] Password [email or web address] 11/11/2013, 2:18 PM   LOS: 4 days   **Disclaimer: This note may have been dictated with voice recognition software. Similar sounding words can inadvertently be transcribed and this note may contain transcription errors which may not have been corrected upon publication of note.**

## 2013-11-12 DIAGNOSIS — J449 Chronic obstructive pulmonary disease, unspecified: Secondary | ICD-10-CM

## 2013-11-12 LAB — GLUCOSE, CAPILLARY
GLUCOSE-CAPILLARY: 95 mg/dL (ref 70–99)
Glucose-Capillary: 97 mg/dL (ref 70–99)
Glucose-Capillary: 89 mg/dL (ref 70–99)

## 2013-11-12 MED ORDER — HYDROMORPHONE HCL PF 1 MG/ML IJ SOLN
1.0000 mg | INTRAMUSCULAR | Status: DC | PRN
  Administered 2013-11-12: 1 mg via INTRAVENOUS
  Filled 2013-11-12: qty 1

## 2013-11-12 MED ORDER — LORAZEPAM 0.5 MG PO TABS
0.5000 mg | ORAL_TABLET | ORAL | Status: DC | PRN
  Administered 2013-11-13 – 2013-11-16 (×7): 0.5 mg via ORAL
  Filled 2013-11-12 (×7): qty 1

## 2013-11-12 MED ORDER — POLYETHYLENE GLYCOL 3350 17 G PO PACK
17.0000 g | PACK | Freq: Two times a day (BID) | ORAL | Status: DC
  Administered 2013-11-12 – 2013-11-16 (×8): 17 g via ORAL
  Filled 2013-11-12 (×10): qty 1

## 2013-11-12 MED ORDER — BISACODYL 5 MG PO TBEC
10.0000 mg | DELAYED_RELEASE_TABLET | Freq: Once | ORAL | Status: AC
  Administered 2013-11-12: 10 mg via ORAL
  Filled 2013-11-12: qty 2

## 2013-11-12 MED ORDER — SENNOSIDES-DOCUSATE SODIUM 8.6-50 MG PO TABS: 2.0000 | ORAL_TABLET | Freq: Two times a day (BID) | ORAL | Status: DC

## 2013-11-12 MED ORDER — SENNA 8.6 MG PO TABS
2.0000 | ORAL_TABLET | Freq: Every day | ORAL | Status: DC
  Administered 2013-11-12 – 2013-11-16 (×5): 17.2 mg via ORAL
  Filled 2013-11-12 (×6): qty 2

## 2013-11-12 MED ORDER — HYDROMORPHONE HCL 2 MG PO TABS
4.0000 mg | ORAL_TABLET | ORAL | Status: DC | PRN
  Administered 2013-11-12 – 2013-11-13 (×2): 4 mg via ORAL
  Filled 2013-11-12 (×3): qty 2

## 2013-11-12 NOTE — Progress Notes (Addendum)
Pt has bp of 78/52 on dinamap, rechecked bp  is 90/60 mm of hg manually. Pt is asymptomatic and denies lightheaded  and dizziness. Pt is asking for iv pain medicine and c/o severe pain in her back. Paged to Tylene Fantasia, NP and notified  and also requested if we can get set parameter for pain medicine as pt 's blood pressure is running low throughout the day . Will cont to monitor.

## 2013-11-12 NOTE — Progress Notes (Signed)
As per order, 1000 ml of normal saline bolus given and received parameter for pain meds.

## 2013-11-12 NOTE — Clinical Social Work Psychosocial (Signed)
Clinical Social Work Department CLINICAL SOCIAL WORK PLACEMENT NOTE 11/12/2013  Patient:  [name] Fitzpatrick, [name] Fitzpatrick  Account Number:  [id number] Admit date:  11/07/2013  Clinical Social Worker:  Daiva Huge  Date/time:  11/12/2013 01:21 PM  Clinical Social Work is seeking post-discharge placement for this patient at the following level of care:   SKILLED NURSING   (*CSW will update this form in Epic as items are completed)   11/12/2013  Patient/family provided with [place] Department of Clinical Social Work's list of facilities offering this level of care within the geographic area requested by the patient (or if unable, by the patient's family).  11/12/2013  Patient/family informed of their freedom to choose among providers that offer the needed level of care, that participate in Medicare, Medicaid or managed care program needed by the patient, have an available bed and are willing to accept the patient.  11/12/2013  Patient/family informed of [facility]' ownership interest in [facility], as well as of the fact that they are under no obligation to receive care at this facility.  PASARR submitted to [employer] on 11/12/2013 PASARR number received on 11/12/2013  FL2 transmitted to all facilities in geographic area requested by pt/family on  11/12/2013 FL2 transmitted to all facilities within larger geographic area on   Patient informed that his/her managed care company has contracts with or will negotiate with  certain facilities, including the following:     Patient/family informed of bed offers received:   Patient chooses bed at  Physician recommends and patient chooses bed at    Patient to be transferred to  on   Patient to be transferred to facility by  Patient and family notified of transfer on  Name of family member notified:    The following physician request were entered in Epic:   Additional Comments:   Eduard Clos, MSW, [place]

## 2013-11-12 NOTE — Clinical Social Work Psychosocial (Signed)
Clinical Social Work Department BRIEF PSYCHOSOCIAL ASSESSMENT 11/12/2013  Patient:  [name] Fitzpatrick, [name] Fitzpatrick     Account Number:  [id number]     Admit date:  11/07/2013  Clinical Social Worker:  Daiva Huge  Date/Time:  11/12/2013 01:03 PM  Referred by:  Physician  Date Referred:  11/11/2013 Referred for  SNF Placement   Other Referral:   Interview type:  Patient Other interview type:    PSYCHOSOCIAL DATA Living Status:  ALONE Admitted from facility:   Level of care:   Primary support name:  mother, husband, son Primary support relationship to patient:  FAMILY Degree of support available:   minimal    CURRENT CONCERNS Current Concerns  Post-Acute Placement   Other Concerns:    SOCIAL WORK ASSESSMENT / PLAN Met with patient to discuss SNF at d/c- she reports that she has been to SNF in the past and would like to go back where she was - [place]- and where her mother is getting care.   Assessment/plan status:  Other - See comment Other assessment/ plan:   FL2 and PASARR for SNF search   Information/referral to community resources:   SNF list    PATIENT'S/FAMILY'S RESPONSE TO PLAN OF CARE: Patient was tearful during my visit- she shared that she is upset because her husband did not come today for her procedure even though he had said he was coming- she is disappointed and feels hurt by how he has been treating her since the fall- support and encouragement provided. She was open to discussing plans for SNF at d/c- states she has been to [facility] in the past and would like to go back there- her mother is also a patient there so this would be an added benefit of going there- CSW will complete FL2 and assessment and proceed with SNF plans and advise-    Eduard Clos, MSW, [place]

## 2013-11-12 NOTE — Progress Notes (Signed)
Subjective: L1 KP performed yesterday Slept well feels may be less painful  Objective: Vital signs in last 24 hours: Temp:  [97.7 F (36.5 C)-98.6 F (37 C)] 98.4 F (36.9 C) (08/27 0627) Pulse Rate:  [51-87] 62 (08/27 0627) Resp:  [10-24] 16 (08/27 0627) BP: (78-120)/(38-78) 120/76 mmHg (08/27 0627) SpO2:  [95 %-100 %] 98 % (08/27 0627) Last BM Date: 11/07/13  Intake/Output from previous day: 08/26 0701 - 08/27 0700 In: 1960 [P.O.:480; I.V.:480; IV Piggyback:1000] Out: -  Intake/Output this shift:    PE:  Afeb; vss Site clean and dry; sl tender Moves all 4s Resting comfortably  Lab Results:   Recent Labs  11/11/13 0725  WBC 8.8  HGB 11.6*  HCT 34.7*  PLT 186   BMET  Recent Labs  11/11/13 0725  NA 141  K 4.2  CL 103  CO2 25  GLUCOSE 85  BUN 15  CREATININE 0.68  CALCIUM 9.2   PT/INR  Recent Labs  11/11/13 0725  LABPROT 13.9  INR 1.07   ABG  Recent Labs  11/09/13 1121  PHART 7.587*  HCO3 31.6*    Studies/Results: Dg Chest Port 1 View  11/10/2013   CLINICAL DATA:  Shortness of breath and productive cough, post back surgery, history diabetes, CHF, COPD  EXAM: PORTABLE CHEST - 1 VIEW  COMPARISON:  Portable exam 0846 hr compared 11/09/2013  FINDINGS: Borderline enlargement of cardiac silhouette.  Slight pulmonary vascular congestion.  Mediastinal contours normal.  Bibasilar atelectasis.  No pulmonary infiltrate, pleural effusion or pneumothorax.  IMPRESSION: Bibasilar atelectasis.   Electronically Signed   By: Lavonia Dana M.D.   On: 11/10/2013 09:00   Ir Kypho Vertebral Lumbar Augmentation  11/11/2013   CLINICAL DATA:  Severe low back pain secondary to compression fracture at L1.  EXAM: KYPHOPLASTY AT L1  PROCEDURE: Following a full explanation of the procedure along with the potentially associated complications, an informed witnessed consent was obtained.  The patient was placed prone on the fluoroscopic table. The skin overlying the lumbar  region was then prepped and draped in the usual sterile fashion. The right pedicle at L1 was then infiltrated with 0.25% bupivacaine followed by the advancement of an 11-gauge Jamshidi needle through the right pedicle into the posterior one-third at L1. This was then exchanged for a Kyphon advanced osteo introducer system comprised of a working cannula and a Kyphon osteo drill.  This combination was then advanced over a Kyphon osteo bone pin until the tip of the Kyphon osteo drill was in the posterior third at L1.  At this time, the bone pin was removed. In a medial trajectory, the combination was advanced until the tip of the working cannula was inside the posterior one-third at L1.  The osteo drill was removed and a core sample sent for pathologic analysis.  Through the working cannula, a Kyphon bone biopsy device was advanced to within 5 mm of the anterior aspect of L1. A core sample from this was also sent for pathologic analysis.  Through the working cannula, a Kyphon inflatable bone tamp 20 x 3 was advanced and positioned with the distal marker 5 mm from the anterior aspect of L1. Crossing of the midline was seen on the AP projection. At this time, the balloon was expanded using contrast via a Kyphon inflation syringe device via microtubing.  Inflations were continued until there was apposition with the superior and the inferior endplates.  At this time, methylmethacrylate mixture was reconstituted with tobramycin in the  Kyphon bone mixing device system. This was then loaded onto the Kyphon bone fillers.  The balloon was deflated and removed followed by the instillation of 4 bone filler equivalents of methylmethacrylate mixture at L1 with excellent filling in the AP and lateral projections. No extravasation was noted in the disk spaces or posteriorly into the spinal canal. No epidural venous contamination was seen.  The patient tolerated the procedure well. There were no acute complications. The working  cannula and the bone filler were then retrieved and removed.  Medications utilized: Versed 3 mg IV and Fentanyl 75 micrograms IV. 2 mg Dilaudid IV  Total Moderate Sedation Time:  30 minutes  IMPRESSION: 1. Status post fluoroscopic-guided needle placement for deep core bone biopsy at L1  2. Status post vertebral body augmentation using balloon kyphoplasty at L1 as described without event.   Electronically Signed   By: Luanne Bras M.D.   On: 11/11/2013 09:57    Anti-infectives: Anti-infectives   Start     Dose/Rate Route Frequency Ordered Stop   11/11/13 0800  vancomycin (VANCOCIN) IVPB 1000 mg/200 mL premix     1,000 mg 200 mL/hr over 60 Minutes Intravenous On call 11/10/13 1447 11/12/13 0800   11/11/13 0758  tobramycin (NEBCIN) 1.2 G powder    Comments:  Michela Pitcher   : cabinet override      11/11/13 0758 11/11/13 0815      Assessment/Plan: s/p * No surgery found *  L1 KP 8/26 Better Plan per MD   LOS: 5 days    Shabree Tebbetts A 11/12/2013

## 2013-11-12 NOTE — Progress Notes (Signed)
PATIENT DETAILS Name: [name] Fitzpatrick Age: 58 y.o. Sex: female Date of Birth: 12/29/1955 Admit Date: 11/07/2013 Admitting Physician Phillips Grout, MD YOV:ZCHYIFOY Moshe Cipro, MD  Subjective: Claims to have somewhat improved back pain today. Still requiring much more narcotics than her usual baseline.  Assessment/Plan: Principal Problem:   Compression fracture of L1 lumbar vertebra - Secondary to a mechanical fall. She was admitted, pain control was attempted with narcotics, however because of persistent pain interventional radiology was consulted. MRI of the lumbosacral spine confirmed acute mild superior endplate compression deformity at L1. Patient subsequently underwent kyphoplasty with biopsy on 8/26. Will try to minimize IV Dilaudid for breakthrough-start oral Dilaudid for breakthrough instead. Continue with methadone 10 mg 3 times a day. Patient has a long-standing history of narcotic dependence and narcotic tolerance, will need to slowly optimize/balance pain needs.  Active Problems: Acute respiratory distress- secondary to vocal cord dysfunction exacerbation - Occurred on 8/24, briefly transferred ICU and required BiPAP. Significantly improved with just supportive care. On oxygen via nasal cannula at 2 L per minute  COPD - Lungs clear, stable. Acute respiratory distress on 8/24 secondary to vocal cord dysfunction. - Continue with budesonide nebs, continue with scheduled doing meds. Follow clinically.  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 Coral Else for patient to mobilize with WBAT on the right ankle in a CAM boot. Recommended to followup with [facility] for elective revision of arthrodesis  Chronic pain syndrome/Fibromyalgia/Chronic Low Back Pain - On chronic methadone treatment as an outpatient. Will slowly taper off IV Dilaudid- will slowly start oral Dilaudid for breakthrough, continue with methadone  he  Hypothyroidism  -continue Synthroid, TSH normal   Anxiety/depression - Continue with Zoloft and trazodone  Disposition: Remain inpatient- suspect SNF on 8/28  DVT Prophylaxis: Prophylactic Lovenox   Code Status: Full code   Family Communication None at bedside  Procedures:  Kyphoplasty-8/26  CONSULTS:  orthopedic surgery and IR  MEDICATIONS: Scheduled Meds: . budesonide  0.25 mg Nebulization BID  . fluticasone  2 spray Each Nare Daily  . ipratropium-albuterol  3 mL Nebulization TID  . levothyroxine  224 mcg Oral QAC breakfast  . methadone  10 mg Oral TID  . polyethylene glycol  17 g Oral Daily  . senna-docusate  2 tablet Oral QHS  . sertraline  100 mg Oral QHS  . sertraline  200 mg Oral Daily  . traZODone  150 mg Oral QHS   Continuous Infusions: . sodium chloride 20 mL/hr at 11/11/13 1428   PRN Meds:.albuterol, HYDROmorphone (DILAUDID) injection, HYDROmorphone, LORazepam, methocarbamol (ROBAXIN) IV, ondansetron (ZOFRAN) IV, ondansetron, sodium phosphate  Antibiotics: Anti-infectives   Start     Dose/Rate Route Frequency Ordered Stop   11/11/13 0800  vancomycin (VANCOCIN) IVPB 1000 mg/200 mL premix     1,000 mg 200 mL/hr over 60 Minutes Intravenous On call 11/10/13 1447 11/12/13 0800   11/11/13 0758  tobramycin (NEBCIN) 1.2 G powder    Comments:  Michela Pitcher   : cabinet override      11/11/13 0758 11/11/13 0815       PHYSICAL EXAM: Vital signs in last 24 hours: Filed Vitals:   11/11/13 2133 11/11/13 2352 11/12/13 0237 11/12/13 0627  BP: 78/52 99/57 102/67 120/76  Pulse:  68 62 62  Temp:    98.4 F (36.9 C)  TempSrc:    Oral  Resp:    16  Height:  Weight:      SpO2:    98%    Weight change:  Filed Weights   11/08/13 0546 11/09/13 0631 11/10/13 2225  Weight: 95.936 kg (211 lb 8 oz) 95.255 kg (210 lb) 94.394 kg (208 lb 1.6 oz)   Body mass index is 38.05 kg/(m^2).   Gen Exam: Awake and alert-with clear speech.  Neck: Supple, No  JVD.   Chest: B/L Clear.   CVS: S1 S2 Regular, no murmurs.  Abdomen: soft, BS +, non tender, non distended.  Extremities: no edema, lower extremities warm to touch. Neurologic: Non Focal.   Skin: No Rash.   Wounds: N/A.   Intake/Output from previous day:  Intake/Output Summary (Last 24 hours) at 11/12/13 1245 Last data filed at 11/12/13 9562  Gross per 24 hour  Intake   2080 ml  Output      0 ml  Net   2080 ml     LAB RESULTS: CBC  Recent Labs Lab 11/08/13 0020 11/08/13 0634 11/11/13 0725  WBC 8.5 5.9 8.8  HGB 11.0* 10.5* 11.6*  HCT 32.8* 31.7* 34.7*  PLT 181 149* 186  MCV 86.8 86.4 88.1  MCH 29.1 28.6 29.4  MCHC 33.5 33.1 33.4  RDW 13.3 13.2 13.2  LYMPHSABS 2.2  --   --   MONOABS 0.5  --   --   EOSABS 0.2  --   --   BASOSABS 0.0  --   --     Chemistries   Recent Labs Lab 11/08/13 0020 11/08/13 0634 11/11/13 0725  NA 138 139 141  K 4.0 4.2 4.2  CL 97 101 103  CO2 30 29 25   GLUCOSE 80 89 85  BUN 18 14 15   CREATININE 0.80 0.74 0.68  CALCIUM 9.4 9.4 9.2    CBG:  Recent Labs Lab 11/09/13 1933 11/09/13 2358 11/10/13 0755 11/10/13 1243 11/12/13 1158  GLUCAP 168* 138* 129* 125* 97    GFR Estimated Creatinine Clearance: 83 ml/min (by C-G formula based on Cr of 0.68).  Coagulation profile  Recent Labs Lab 11/08/13 0020 11/11/13 0725  INR 0.96 1.07    Cardiac Enzymes  Recent Labs Lab 11/08/13 0020 11/09/13 1155  TROPONINI <0.30 <0.30    No components found with this basename: POCBNP,  No results found for this basename: DDIMER,  in the last 72 hours No results found for this basename: HGBA1C,  in the last 72 hours No results found for this basename: CHOL, HDL, LDLCALC, TRIG, CHOLHDL, LDLDIRECT,  in the last 72 hours No results found for this basename: TSH, T4TOTAL, FREET3, T3FREE, THYROIDAB,  in the last 72 hours No results found for this basename: VITAMINB12, FOLATE, FERRITIN, TIBC, IRON, RETICCTPCT,  in the last 72 hours No  results found for this basename: LIPASE, AMYLASE,  in the last 72 hours  Urine Studies No results found for this basename: UACOL, UAPR, USPG, UPH, UTP, UGL, UKET, UBIL, UHGB, UNIT, UROB, ULEU, UEPI, UWBC, URBC, UBAC, CAST, CRYS, UCOM, BILUA,  in the last 72 hours  MICROBIOLOGY: No results found for this or any previous visit (from the past 240 hour(s)).  RADIOLOGY STUDIES/RESULTS: Dg Lumbar Spine Complete  11/08/2013   CLINICAL DATA:  58 year old female status post fall with pain. Prior surgery. Initial encounter.  EXAM: LUMBAR SPINE - COMPLETE 4+ VIEW  COMPARISON:  CT Abdomen and Pelvis 09/01/2010.  Chest CT 09/14/2011.  FINDINGS: Sequelae of posterior and interbody fusion re- identified in the lower lumbar spine. Lumbar segmentation appears to  be normal, the operated levels are L4-L5 and L5-S1. New L1 compression fracture since 2013, primarily with superior endplate deformity. Mild loss of height. No evidence of retropulsed bone. Other lumbar levels appear stable and intact. Grossly intact visualized lower thoracic levels. Sacral ala and SI joints appear grossly intact.  IMPRESSION: 1. L1 compression fracture new since 2013, suspicious for acute injury in this setting. If specific therapy such as vertebroplasty is desired, lumbar MRI or whole-body bone scan would best confirm acuity. 2. Otherwise stable postoperative appearance of the lumbar spine.   Electronically Signed   By: Lars Pinks M.D.   On: 11/08/2013 01:42   Dg Pelvis 1-2 Views  11/08/2013   CLINICAL DATA:  Fall with back pain  EXAM: PELVIS - 1-2 VIEW  COMPARISON:  None.  FINDINGS: No evidence of pelvic ring fracture or diastasis. No evidence of hip fracture or dislocation. Irregularity of the bilateral anterior iliac wings consistent with harvest sites in this patient with lower lumbar fusions.  IMPRESSION: No acute osseous findings.   Electronically Signed   By: Jorje Guild M.D.   On: 11/08/2013 01:36   Dg Wrist Complete  Right  11/08/2013   CLINICAL DATA:  Fall  EXAM: RIGHT WRIST - COMPLETE 3+ VIEW  COMPARISON:  None.  FINDINGS: There is no evidence of fracture or dislocation. There is no evidence of arthropathy or other focal bone abnormality. Soft tissues are unremarkable.  IMPRESSION: Negative.   Electronically Signed   By: Jeannine Boga M.D.   On: 11/08/2013 01:49   Dg Ankle Complete Right  11/08/2013   CLINICAL DATA:  Fall.  EXAM: RIGHT ANKLE - COMPLETE 3+ VIEW  COMPARISON:  07/01/2011  FINDINGS: Status post attempted tibiotalar arthrodesis with plate and lag screw traversing the joint. Progressive lucency around the hardware, with an anterior plate fracture that is new. No osseous erosion or periosteal reaction strongly suspicious for infection. There has also been at a subtalar arthrodesis which appears complete. Lateral calcaneus plate and screw fixation for remote injury. No acute osseous fracture. No dislocation.  IMPRESSION: 1. Failed ankle arthrodesis with ventral plate fracture that is new from 2013. 2. Complete subtalar arthrodesis. 3. No acute osseous fracture.   Electronically Signed   By: Jorje Guild M.D.   On: 11/08/2013 01:47   Mr Lumbar Spine Wo Contrast  11/08/2013   CLINICAL DATA:  Severe low back pain status post fall yesterday. L1 compression fracture.  EXAM: MRI LUMBAR SPINE WITHOUT CONTRAST  TECHNIQUE: Multiplanar, multisequence MR imaging of the lumbar spine was performed. No intravenous contrast was administered.  COMPARISON:  Abdominal CT 09/01/2010. Lumbar spine radiographs 11/08/2013.  FINDINGS: Examination is mildly motion degraded.  Five lumbar type vertebral bodies are assumed. There are stable postsurgical changes status post L4 through S1 PLIF. As demonstrated on the earlier radiographs, there is a new mild superior endplate compression deformity at L1. This results in approximately 15% loss of vertebral body height. There is associated marrow edema within the superior endplate.  There is 3 mm of osseous retropulsion. The posterior elements are intact. No other fractures are demonstrated. The alignment is normal.  The conus medullaris extends to the L2-3 level and appears normal. No acute paraspinal abnormalities are seen. There is chronic fatty atrophy of the erector spinae musculature.  There is mild disc degeneration in the lower thoracic spine. The discs are well hydrated with maintained height at L1-2, L2-3 and L3-4. There is no evidence of spinal stenosis or nerve root encroachment.  IMPRESSION: 1. Acute mild superior endplate compression deformity at L1 with minimal associated osseous retropulsion. The posterior elements are intact. 2. No other acute osseous findings or malalignment. 3. Stable postsurgical changes status post L4 through S1 fusion.   Electronically Signed   By: Camie Patience M.D.   On: 11/08/2013 17:16   Dg Chest Port 1 View  11/10/2013   CLINICAL DATA:  Shortness of breath and productive cough, post back surgery, history diabetes, CHF, COPD  EXAM: PORTABLE CHEST - 1 VIEW  COMPARISON:  Portable exam 0846 hr compared 11/09/2013  FINDINGS: Borderline enlargement of cardiac silhouette.  Slight pulmonary vascular congestion.  Mediastinal contours normal.  Bibasilar atelectasis.  No pulmonary infiltrate, pleural effusion or pneumothorax.  IMPRESSION: Bibasilar atelectasis.   Electronically Signed   By: Lavonia Dana M.D.   On: 11/10/2013 09:00   Dg Chest Port 1 View  11/09/2013   CLINICAL DATA:  Shortness of breath.  EXAM: PORTABLE CHEST - 1 VIEW  COMPARISON:  Aug 14, 2012.  FINDINGS: Stable cardiomediastinal silhouette. No pneumothorax or pleural effusion is noted. Increased central pulmonary vascular congestion is noted. Linear densities are noted in both lung bases most consistent with subsegmental atelectasis. Bony thorax intact.  IMPRESSION: Increased central pulmonary vascular congestion. Increased bilateral basilar linear densities are noted consistent with  subsegmental atelectasis.   Electronically Signed   By: Sabino Dick M.D.   On: 11/09/2013 08:48    Oren Binet, MD  [facility] Pager:[phone]  If 7PM-7AM, please contact night-coverage [email or web address] Password [email or web address] 11/12/2013, 12:45 PM   LOS: 5 days   **Disclaimer: This note may have been dictated with voice recognition software. Similar sounding words can inadvertently be transcribed and this note may contain transcription errors which may not have been corrected upon publication of note.**

## 2013-11-12 NOTE — Progress Notes (Signed)
Rechecked BP after NS bolus given is 99/57 m of hg, will cont to monitor.

## 2013-11-12 NOTE — Progress Notes (Signed)
Physical Therapy Treatment Patient Details Name: [name] Fitzpatrick MRN: [id number] DOB: 1955/10/21 Today's Date: 11/12/2013    History of Present Illness 58 yo WF with hx of intubations for stridor, copd, chronic pain on methadone, fibromyalgia, anxiety disorder, suicide attempts who fell and was admitted 8/23 post fall with lumbar compression fx. She developed acute resp distress 8/24, PCCM came to room at Rapid response team request. She was placed on NIMVS, sedated and moved to ICU. Hopefully we will avoid intubation but she may need intubation if refractory to current treatments. S/p L1 balloon kyphoplasty 11/11/13.    PT Comments    Patient continues to have increased pain in back at rest, worsened with mobility. Improved ambulation distance today however continues to exhibit balance deficits during gait due to pain in right ankle and shooting pains in low back. Goals updated due to improved mobility and progress. Encouraged ambulation to bathroom daily with RN. Reviewed back precautions. Will continue to follow and progress as tolerated.    Follow Up Recommendations  SNF;Supervision/Assistance - 24 hour     Equipment Recommendations  None recommended by PT    Recommendations for Other Services       Precautions / Restrictions Precautions Precautions: Fall;Back Precaution Booklet Issued: No Precaution Comments: Education provided on back precautions. Required Braces or Orthoses: Other Brace/Splint Other Brace/Splint: CAM boot right LE and right wrist Futura splint Restrictions Weight Bearing Restrictions: No RUE Weight Bearing: Weight bearing as tolerated RLE Weight Bearing: Weight bearing as tolerated    Mobility  Bed Mobility               General bed mobility comments: Received sitting in chair upon arrival.  Transfers Overall transfer level: Needs assistance Equipment used: Rolling walker (2 wheeled) Transfers: Sit to/from Stand Sit to Stand: Min guard         General transfer comment: Transferred on/off toilet with use of grab bar. Min guard for safety.  Ambulation/Gait Ambulation/Gait assistance: Min guard Ambulation Distance (Feet): 25 Feet (+25' ) Assistive device: Rolling walker (2 wheeled) Gait Pattern/deviations: Step-to pattern;Decreased stance time - right;Decreased step length - left;Antalgic Gait velocity: Decreased   General Gait Details: Slow, steady gait however distance limited due to nausea and pain in back. VC for RW management.   Stairs            Wheelchair Mobility    Modified Rankin (Stroke Patients Only)       Balance Overall balance assessment: Needs assistance   Sitting balance-Leahy Scale: Fair       Standing balance-Leahy Scale: Poor Standing balance comment: Needs BUE support and use of RW to maintain balance in standing most likely due to pain.                    Cognition Arousal/Alertness: Awake/alert Behavior During Therapy: WFL for tasks assessed/performed Overall Cognitive Status: Within Functional Limits for tasks assessed                      Exercises      General Comments General comments (skin integrity, edema, etc.): Adjusted CAM boot.      Pertinent Vitals/Pain Pain Assessment: 0-10 Pain Score: 7  Pain Location: back Pain Descriptors / Indicators: Sore;Aching;Dull;Sharp Pain Intervention(s): Limited activity within patient's tolerance;Monitored during session;Repositioned    Home Living                      Prior Function  PT Goals (current goals can now be found in the care plan section) Progress towards PT goals: Goals met and updated - see care plan;Progressing toward goals    Frequency  Min 3X/week    PT Plan Current plan remains appropriate    Co-evaluation             End of Session Equipment Utilized During Treatment: Gait belt;Oxygen;Other (comment) Activity Tolerance: Patient limited by pain Patient  left: in chair;with call bell/phone within reach;with chair alarm set     Time: 1640-1710 PT Time Calculation (min): 30 min  Charges:  $Gait Training: 8-22 mins $Therapeutic Activity: 8-22 mins                    G CodesCandy Fitzpatrick A Dec 02, 2013, 5:23 PM [name] Fitzpatrick, [place], DPT [phone]

## 2013-11-12 NOTE — Progress Notes (Signed)
PT Cancellation Note  Patient Details Name: [name] Fitzpatrick MRN: [id number] DOB: Aug 15, 1955   Cancelled Treatment:    Reason Eval/Treat Not Completed: Patient declined, no reason specified Pt declines participating in therapy today, "my head hurts." Will follow up later in PM or on 8/28 when pt willing.   [name] A 11/12/2013, 12:16 PM [name], [place], DPT [phone]

## 2013-11-13 LAB — GLUCOSE, CAPILLARY
GLUCOSE-CAPILLARY: 115 mg/dL — AB (ref 70–99)
Glucose-Capillary: 86 mg/dL (ref 70–99)
Glucose-Capillary: 114 mg/dL — ABNORMAL HIGH (ref 70–99)
Glucose-Capillary: 96 mg/dL (ref 70–99)

## 2013-11-13 MED ORDER — METHADONE HCL 10 MG PO TABS: 10.0000 mg | ORAL_TABLET | Freq: Three times a day (TID) | ORAL | Status: DC

## 2013-11-13 MED ORDER — HYDROMORPHONE HCL 2 MG PO TABS: 2.0000 mg | ORAL_TABLET | ORAL | Status: DC | PRN

## 2013-11-13 MED ORDER — SENNA 8.6 MG PO TABS: 2.0000 | ORAL_TABLET | Freq: Every day | ORAL | Status: DC

## 2013-11-13 MED ORDER — POLYETHYLENE GLYCOL 3350 17 G PO PACK: 17.0000 g | PACK | Freq: Two times a day (BID) | ORAL | Status: DC

## 2013-11-13 MED ORDER — BUDESONIDE 0.25 MG/2ML IN SUSP: 0.2500 mg | Freq: Two times a day (BID) | RESPIRATORY_TRACT | Status: DC

## 2013-11-13 MED ORDER — IPRATROPIUM-ALBUTEROL 0.5-2.5 (3) MG/3ML IN SOLN: 3.0000 mL | Freq: Three times a day (TID) | RESPIRATORY_TRACT | Status: DC

## 2013-11-13 MED ORDER — ONDANSETRON HCL 4 MG PO TABS: 4.0000 mg | ORAL_TABLET | Freq: Three times a day (TID) | ORAL | Status: DC | PRN

## 2013-11-13 MED ORDER — POTASSIUM CHLORIDE CRYS ER 20 MEQ PO TBCR: 20.0000 meq | EXTENDED_RELEASE_TABLET | Freq: Every day | ORAL | Status: DC

## 2013-11-13 MED ORDER — HYDROMORPHONE HCL 2 MG PO TABS
2.0000 mg | ORAL_TABLET | ORAL | Status: DC | PRN
  Administered 2013-11-13: 4 mg via ORAL
  Administered 2013-11-13: 2 mg via ORAL
  Administered 2013-11-13 – 2013-11-16 (×9): 4 mg via ORAL
  Filled 2013-11-13 (×2): qty 2
  Filled 2013-11-13: qty 1
  Filled 2013-11-13 (×8): qty 2

## 2013-11-13 MED ORDER — FUROSEMIDE 20 MG PO TABS: 20.0000 mg | ORAL_TABLET | Freq: Every day | ORAL | Status: DC

## 2013-11-13 MED ORDER — LORAZEPAM 0.5 MG PO TABS: 0.5000 mg | ORAL_TABLET | Freq: Three times a day (TID) | ORAL | Status: DC | PRN

## 2013-11-13 MED ORDER — ALBUTEROL SULFATE (2.5 MG/3ML) 0.083% IN NEBU: 2.5000 mg | INHALATION_SOLUTION | RESPIRATORY_TRACT | Status: DC | PRN

## 2013-11-13 NOTE — Clinical Social Work Note (Signed)
Patient for d/c today to SNF bed at [facility]. Patient agreeable to this plan- she intends to let her family know per her request- plan transfer via EMS. Eduard Clos, MSW, [place]

## 2013-11-13 NOTE — Discharge Summary (Addendum)
PATIENT DETAILS Name: [name] Fitzpatrick Age: 58 y.o. Sex: female Date of Birth: 01/08/1956 MRN: [id number]. Admit Date: 11/07/2013 Discharge Date:11/16/13 Admitting Physician: Phillips Grout, MD OTL:[id number] Moshe Cipro, MD  Recommendations for Outpatient Follow-up:  1. Please optimize narcotic regimen-needs followup with primary pain management M.D.-Dr. Nicholaus Bloom 2. Need to followup with primary orthopedist at [facility] orthopedics-Dr. Francee Gentile and Dr [name] (see below) 3. Please check CBC, chemistries in one week  PRIMARY DISCHARGE DIAGNOSIS:  Principal Problem:   Compression fracture of L1 lumbar vertebra Active Problems:   NECK PAIN, CHRONIC   COPD (chronic obstructive pulmonary disease)   Post traumatic stress disorder (PTSD)   Back pain, acute   Fall at home   Respiratory distress      PAST MEDICAL HISTORY: Past Medical History  Diagnosis Date  . Anxiety   . Depression   . Hypothyroidism   . Osteoarthritis   . IBS (irritable bowel syndrome)     with constipation  . Fibromyalgia   . Chronic LBP   . Migraine headache   . Chronic neck pain   . DM type 2 (diabetes mellitus, type 2)   . Hypertension   . COPD (chronic obstructive pulmonary disease)   . H/O suicide attempt     DISCHARGE MEDICATIONS:   Medication List    STOP taking these medications       albuterol-ipratropium 18-103 MCG/ACT inhaler  Commonly known as:  COMBIVENT  Replaced by:  ipratropium-albuterol 0.5-2.5 (3) MG/3ML Soln     promethazine-dextromethorphan 6.25-15 MG/5ML syrup  Commonly known as:  PROMETHAZINE-DM      TAKE these medications       albuterol (2.5 MG/3ML) 0.083% nebulizer solution  Commonly known as:  PROVENTIL  Take 3 mLs (2.5 mg total) by nebulization every 2 (two) hours as needed for wheezing or shortness of breath.     aspirin EC 81 MG tablet  Take 81 mg by mouth daily.     budesonide 0.25 MG/2ML nebulizer solution  Commonly known as:  PULMICORT  Take 2  mLs (0.25 mg total) by nebulization 2 (two) times daily.     dicyclomine 10 MG capsule  Commonly known as:  BENTYL  Take 10 mg by mouth 2 (two) times daily.     estradiol 0.1 MG/GM vaginal cream  Commonly known as:  ESTRACE  Place 1 Applicatorful vaginally at bedtime.     fluticasone 50 MCG/ACT nasal spray  Commonly known as:  FLONASE  Place 2 sprays into both nostrils daily.     furosemide 20 MG tablet  Commonly known as:  LASIX  Take 1 tablet (20 mg total) by mouth daily.     HYDROmorphone 2 MG tablet  Commonly known as:  DILAUDID  Take 1-2 tablets (2-4 mg total) by mouth every 2 (two) hours as needed for moderate pain.     ipratropium-albuterol 0.5-2.5 (3) MG/3ML Soln  Commonly known as:  DUONEB  Take 3 mLs by nebulization 3 (three) times daily.     levothyroxine 112 MCG tablet  Commonly known as:  SYNTHROID, LEVOTHROID  Take 224 mcg by mouth daily before breakfast.     lidocaine 5 %  Commonly known as:  LIDODERM  Place 1 patch onto the skin daily.     LORazepam 0.5 MG tablet  Commonly known as:  ATIVAN  Take 1 tablet (0.5 mg total) by mouth every 8 (eight) hours as needed for anxiety.     meclizine 12.5 MG tablet  Commonly  known as:  ANTIVERT  Take 12.5 mg by mouth 3 (three) times daily as needed for dizziness.     methadone 10 MG tablet  Commonly known as:  DOLOPHINE  Take 1.5 tablets (15 mg total) by mouth 3 (three) times daily.     ondansetron 4 MG tablet  Commonly known as:  ZOFRAN  Take 1 tablet (4 mg total) by mouth every 8 (eight) hours as needed for nausea.     polyethylene glycol packet  Commonly known as:  MIRALAX / GLYCOLAX  Take 17 g by mouth 2 (two) times daily.     potassium chloride SA 20 MEQ tablet  Commonly known as:  K-DUR,KLOR-CON  Take 1 tablet (20 mEq total) by mouth daily.     senna 8.6 MG Tabs tablet  Commonly known as:  SENOKOT  Take 2 tablets (17.2 mg total) by mouth at bedtime.     sertraline 100 MG tablet  Commonly known  as:  ZOLOFT  Take 200 mg by mouth every morning.     sertraline 100 MG tablet  Commonly known as:  ZOLOFT  Take 100 mg by mouth every evening.     solifenacin 10 MG tablet  Commonly known as:  VESICARE  Take 5 mg by mouth daily.     traZODone 100 MG tablet  Commonly known as:  DESYREL  Take 100-200 mg by mouth at bedtime as needed for sleep.        ALLERGIES:   Allergies  Allergen Reactions  . Prednisone Shortness Of Breath and Nausea And Vomiting  . Penicillins Other (See Comments)    Pt states she almost died after taking the following. She states that she had Stroke like symptoms post taking the medication  . Cephalexin Hives, Swelling and Rash  . Latex Rash and Other (See Comments)    Causes skin to tear easily  . Metoclopramide Hcl Hives and Rash    BRIEF HPI:  See H&P, Labs, Consult and Test reports for all details in brief, patient 58 yo female h/o chronic neck pain on methadone, fibromyalgia, copd, dm, htn comes in after tripping and falling on her husbands shoes- following which she had severe pain, x-rays and further neurological studies demonstrated acute L1 compression fracture. She was admitted for further evaluation and treatment  CONSULTATIONS:   orthopedic surgery and IR  PERTINENT RADIOLOGIC STUDIES: Dg Lumbar Spine Complete  11/08/2013   CLINICAL DATA:  58 year old female status post fall with pain. Prior surgery. Initial encounter.  EXAM: LUMBAR SPINE - COMPLETE 4+ VIEW  COMPARISON:  CT Abdomen and Pelvis 09/01/2010.  Chest CT 09/14/2011.  FINDINGS: Sequelae of posterior and interbody fusion re- identified in the lower lumbar spine. Lumbar segmentation appears to be normal, the operated levels are L4-L5 and L5-S1. New L1 compression fracture since 2013, primarily with superior endplate deformity. Mild loss of height. No evidence of retropulsed bone. Other lumbar levels appear stable and intact. Grossly intact visualized lower thoracic levels. Sacral ala and  SI joints appear grossly intact.  IMPRESSION: 1. L1 compression fracture new since 2013, suspicious for acute injury in this setting. If specific therapy such as vertebroplasty is desired, lumbar MRI or whole-body bone scan would best confirm acuity. 2. Otherwise stable postoperative appearance of the lumbar spine.   Electronically Signed   By: Lars Pinks M.D.   On: 11/08/2013 01:42   Dg Pelvis 1-2 Views  11/08/2013   CLINICAL DATA:  Fall with back pain  EXAM: PELVIS - 1-2  VIEW  COMPARISON:  None.  FINDINGS: No evidence of pelvic ring fracture or diastasis. No evidence of hip fracture or dislocation. Irregularity of the bilateral anterior iliac wings consistent with harvest sites in this patient with lower lumbar fusions.  IMPRESSION: No acute osseous findings.   Electronically Signed   By: Jorje Guild M.D.   On: 11/08/2013 01:36   Dg Wrist Complete Right  11/08/2013   CLINICAL DATA:  Fall  EXAM: RIGHT WRIST - COMPLETE 3+ VIEW  COMPARISON:  None.  FINDINGS: There is no evidence of fracture or dislocation. There is no evidence of arthropathy or other focal bone abnormality. Soft tissues are unremarkable.  IMPRESSION: Negative.   Electronically Signed   By: Jeannine Boga M.D.   On: 11/08/2013 01:49   Dg Ankle Complete Right  11/08/2013   CLINICAL DATA:  Fall.  EXAM: RIGHT ANKLE - COMPLETE 3+ VIEW  COMPARISON:  07/01/2011  FINDINGS: Status post attempted tibiotalar arthrodesis with plate and lag screw traversing the joint. Progressive lucency around the hardware, with an anterior plate fracture that is new. No osseous erosion or periosteal reaction strongly suspicious for infection. There has also been at a subtalar arthrodesis which appears complete. Lateral calcaneus plate and screw fixation for remote injury. No acute osseous fracture. No dislocation.  IMPRESSION: 1. Failed ankle arthrodesis with ventral plate fracture that is new from 2013. 2. Complete subtalar arthrodesis. 3. No acute osseous  fracture.   Electronically Signed   By: Jorje Guild M.D.   On: 11/08/2013 01:47   Mr Lumbar Spine Wo Contrast  11/08/2013   CLINICAL DATA:  Severe low back pain status post fall yesterday. L1 compression fracture.  EXAM: MRI LUMBAR SPINE WITHOUT CONTRAST  TECHNIQUE: Multiplanar, multisequence MR imaging of the lumbar spine was performed. No intravenous contrast was administered.  COMPARISON:  Abdominal CT 09/01/2010. Lumbar spine radiographs 11/08/2013.  FINDINGS: Examination is mildly motion degraded.  Five lumbar type vertebral bodies are assumed. There are stable postsurgical changes status post L4 through S1 PLIF. As demonstrated on the earlier radiographs, there is a new mild superior endplate compression deformity at L1. This results in approximately 15% loss of vertebral body height. There is associated marrow edema within the superior endplate. There is 3 mm of osseous retropulsion. The posterior elements are intact. No other fractures are demonstrated. The alignment is normal.  The conus medullaris extends to the L2-3 level and appears normal. No acute paraspinal abnormalities are seen. There is chronic fatty atrophy of the erector spinae musculature.  There is mild disc degeneration in the lower thoracic spine. The discs are well hydrated with maintained height at L1-2, L2-3 and L3-4. There is no evidence of spinal stenosis or nerve root encroachment.  IMPRESSION: 1. Acute mild superior endplate compression deformity at L1 with minimal associated osseous retropulsion. The posterior elements are intact. 2. No other acute osseous findings or malalignment. 3. Stable postsurgical changes status post L4 through S1 fusion.   Electronically Signed   By: Camie Patience M.D.   On: 11/08/2013 17:16   Dg Chest Port 1 View  11/10/2013   CLINICAL DATA:  Shortness of breath and productive cough, post back surgery, history diabetes, CHF, COPD  EXAM: PORTABLE CHEST - 1 VIEW  COMPARISON:  Portable exam 0846 hr  compared 11/09/2013  FINDINGS: Borderline enlargement of cardiac silhouette.  Slight pulmonary vascular congestion.  Mediastinal contours normal.  Bibasilar atelectasis.  No pulmonary infiltrate, pleural effusion or pneumothorax.  IMPRESSION: Bibasilar atelectasis.  Electronically Signed   By: Lavonia Dana M.D.   On: 11/10/2013 09:00   Dg Chest Port 1 View  11/09/2013   CLINICAL DATA:  Shortness of breath.  EXAM: PORTABLE CHEST - 1 VIEW  COMPARISON:  Aug 14, 2012.  FINDINGS: Stable cardiomediastinal silhouette. No pneumothorax or pleural effusion is noted. Increased central pulmonary vascular congestion is noted. Linear densities are noted in both lung bases most consistent with subsegmental atelectasis. Bony thorax intact.  IMPRESSION: Increased central pulmonary vascular congestion. Increased bilateral basilar linear densities are noted consistent with subsegmental atelectasis.   Electronically Signed   By: Sabino Dick M.D.   On: 11/09/2013 08:48   Ir Kypho Vertebral Lumbar Augmentation  11/11/2013   CLINICAL DATA:  Severe low back pain secondary to compression fracture at L1.  EXAM: KYPHOPLASTY AT L1  PROCEDURE: Following a full explanation of the procedure along with the potentially associated complications, an informed witnessed consent was obtained.  The patient was placed prone on the fluoroscopic table. The skin overlying the lumbar region was then prepped and draped in the usual sterile fashion. The right pedicle at L1 was then infiltrated with 0.25% bupivacaine followed by the advancement of an 11-gauge Jamshidi needle through the right pedicle into the posterior one-third at L1. This was then exchanged for a Kyphon advanced osteo introducer system comprised of a working cannula and a Kyphon osteo drill.  This combination was then advanced over a Kyphon osteo bone pin until the tip of the Kyphon osteo drill was in the posterior third at L1.  At this time, the bone pin was removed. In a medial  trajectory, the combination was advanced until the tip of the working cannula was inside the posterior one-third at L1.  The osteo drill was removed and a core sample sent for pathologic analysis.  Through the working cannula, a Kyphon bone biopsy device was advanced to within 5 mm of the anterior aspect of L1. A core sample from this was also sent for pathologic analysis.  Through the working cannula, a Kyphon inflatable bone tamp 20 x 3 was advanced and positioned with the distal marker 5 mm from the anterior aspect of L1. Crossing of the midline was seen on the AP projection. At this time, the balloon was expanded using contrast via a Kyphon inflation syringe device via microtubing.  Inflations were continued until there was apposition with the superior and the inferior endplates.  At this time, methylmethacrylate mixture was reconstituted with tobramycin in the Kyphon bone mixing device system. This was then loaded onto the Kyphon bone fillers.  The balloon was deflated and removed followed by the instillation of 4 bone filler equivalents of methylmethacrylate mixture at L1 with excellent filling in the AP and lateral projections. No extravasation was noted in the disk spaces or posteriorly into the spinal canal. No epidural venous contamination was seen.  The patient tolerated the procedure well. There were no acute complications. The working cannula and the bone filler were then retrieved and removed.  Medications utilized: Versed 3 mg IV and Fentanyl 75 micrograms IV. 2 mg Dilaudid IV  Total Moderate Sedation Time:  30 minutes  IMPRESSION: 1. Status post fluoroscopic-guided needle placement for deep core bone biopsy at L1  2. Status post vertebral body augmentation using balloon kyphoplasty at L1 as described without event.   Electronically Signed   By: Luanne Bras M.D.   On: 11/11/2013 09:57     PERTINENT LAB RESULTS: CBC: No results found for  this basename: WBC, HGB, HCT, PLT,  in the last 72  hours CMET CMP     Component Value Date/Time   NA 141 11/11/2013 0725   K 4.2 11/11/2013 0725   CL 103 11/11/2013 0725   CO2 25 11/11/2013 0725   GLUCOSE 85 11/11/2013 0725   BUN 15 11/11/2013 0725   CREATININE 0.68 11/11/2013 0725   CREATININE 0.79 11/04/2013 1703   CALCIUM 9.2 11/11/2013 0725   PROT 7.3 11/08/2013 0020   ALBUMIN 3.8 11/08/2013 0020   AST 20 11/08/2013 0020   ALT 13 11/08/2013 0020   ALKPHOS 69 11/08/2013 0020   BILITOT 0.2* 11/08/2013 0020   GFRNONAA >90 11/11/2013 0725   GFRNONAA >89 05/27/2013 1149   GFRAA >90 11/11/2013 0725   GFRAA >89 05/27/2013 1149    GFR Estimated Creatinine Clearance: 85.9 ml/min (by C-G formula based on Cr of 0.68). No results found for this basename: LIPASE, AMYLASE,  in the last 72 hours No results found for this basename: CKTOTAL, CKMB, CKMBINDEX, TROPONINI,  in the last 72 hours No components found with this basename: POCBNP,  No results found for this basename: DDIMER,  in the last 72 hours No results found for this basename: HGBA1C,  in the last 72 hours No results found for this basename: CHOL, HDL, LDLCALC, TRIG, CHOLHDL, LDLDIRECT,  in the last 72 hours No results found for this basename: TSH, T4TOTAL, FREET3, T3FREE, THYROIDAB,  in the last 72 hours No results found for this basename: VITAMINB12, FOLATE, FERRITIN, TIBC, IRON, RETICCTPCT,  in the last 72 hours Coags: No results found for this basename: PT, INR,  in the last 72 hours Microbiology: No results found for this or any previous visit (from the past 240 hour(s)).   BRIEF HOSPITAL COURSE:  Compression fracture of L1 lumbar vertebra  - Secondary to a mechanical fall. She was admitted, pain control was attempted with narcotics, however because of persistent pain interventional radiology was consulted. MRI of the lumbosacral spine confirmed acute mild superior endplate compression deformity at L1. Patient subsequently underwent kyphoplasty with biopsy on 8/26. She initially required  IV Diluadid for breakthrough, she was maintained at home dose of Methadone . Slowly she was weaned off IV Dilaudid,and transitioned to oral Dilaudid for breakthrough. Methadone has been increased to 15 3 times a day (home dose 10 mg 3 times a day) Patient has a long-standing history of narcotic dependence and narcotic tolerance, will need to slowly optimize/balance pain needs.She is stable to be discharged to SNF for rehab today   Please note bone biopsy during kyphoplasty showed:FRAGMENTS OF BENIGN BONE AND MARROW  Active Problems:  Acute respiratory distress- secondary to vocal cord dysfunction exacerbation  - Occurred on 8/24, briefly transferred ICU and required BiPAP. Significantly improved with just supportive care. On oxygen via nasal cannula at 2 L per minute   COPD  - Lungs clear, stable. Acute respiratory distress on 8/24 secondary to vocal cord dysfunction.  - Continue with budesonide nebs, continue with scheduled doing meds. Follow clinically.   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 Coral Else for patient to mobilize with WBAT on the right ankle in a CAM boot. Recommended to followup with [facility] for elective revision of arthrodesis. Patient also wants to follow with Dr. Skeet Latch.  Chronic pain syndrome/Fibromyalgia/Chronic Low Back Pain  - On chronic methadone treatment as an outpatient. See above regarding pain management. She needs to follow up with her  pain MD for further optimization as an outpattient   Hypothyroidism  -continue Synthroid, TSH normal   Anxiety/depression  - Continue with Zoloft and trazodone   TODAY-DAY OF DISCHARGE:  Subjective:   Suprina Braver today claims that her back pain has continued to improve. No major events overnight.  Objective:   Blood pressure 106/63, pulse 70, temperature 98.8 F (37.1 C), temperature source Oral, resp. rate 18, height 5\' 2"  (1.575 m), weight 100.018  kg (220 lb 8 oz), SpO2 96.00%.  Intake/Output Summary (Last 24 hours) at 11/16/13 0958 Last data filed at 11/16/13 0962  Gross per 24 hour  Intake    275 ml  Output    300 ml  Net    -25 ml   Filed Weights   11/09/13 0631 11/10/13 2225 11/16/13 0704  Weight: 95.255 kg (210 lb) 94.394 kg (208 lb 1.6 oz) 100.018 kg (220 lb 8 oz)    Exam Awake Alert, Oriented *3, No new F.N deficits, Normal affect [place].AT,PERRAL Supple Neck,No JVD, No cervical lymphadenopathy appriciated.  Symmetrical Chest wall movement, Good air movement bilaterally, CTAB RRR,No Gallops,Rubs or new Murmurs, No Parasternal Heave +ve B.Sounds, Abd Soft, Non tender, No organomegaly appriciated, No rebound -guarding or rigidity. No Cyanosis, Clubbing or edema, No new Rash or bruise  DISCHARGE CONDITION: Stable  DISPOSITION: SNF  DISCHARGE INSTRUCTIONS:    Activity:  OK for patient to mobilize with WBAT on the right ankle in a CAM boot. May add right wrist [place] to assist with WBAT on walker No stooping,bending or lifting  More than 10 lbs for 2 weeks.  Use walker to ambulate  .  Diet recommendation: Heart Healthy diet  Discharge Instructions   Call MD for:  persistant nausea and vomiting    Complete by:  As directed      Call MD for:  severe uncontrolled pain    Complete by:  As directed      Diet - low sodium heart healthy    Complete by:  As directed      Increase activity slowly    Complete by:  As directed            Follow-up Information   Follow up with [name], MD. Schedule an appointment as soon as possible for a visit in 1 week.   Specialty:  Family Medicine   Contact information:   [address] [place] [address] [phone]       Follow up with Alvarado, MD. Schedule an appointment as soon as possible for a visit in 1 week. (for pain management)    Specialty:  Anesthesiology   Contact information:   [address] [place]  [address] [phone]       Follow up with Rob Hickman, MD. Schedule an appointment as soon as possible for a visit in 2 weeks.   Specialty:  Interventional Radiology   Contact information:   [address] [place] [address] [phone]       Follow up with SCOTT,AARON, MD. Schedule an appointment as soon as possible for a visit in 2 weeks.   Specialty:  Orthopedic Surgery   Contact information:   [place] [address] [phone]       Follow up with Rozanna Box, MD. Schedule an appointment as soon as possible for a visit in 2 weeks.   Specialty:  Orthopedic Surgery   Contact information:   [address]  [place] [address] [phone]      Total Time spent on discharge equals 45 minutes.  SignedOren Binet 11/16/2013 9:58 AM  **Disclaimer: This note may have been dictated with voice recognition software. Similar sounding words can inadvertently be transcribed and this note may contain transcription errors which may not have been corrected upon publication of note.**

## 2013-11-13 NOTE — Clinical Social Work Note (Signed)
Received word that the SNF was unable to take patient until insurance auth is rec'd- initially, they had agreed to take an LOG but now have rescinded that option-  SNF is on phone with insurance attempting auth now and will advise- MD to be updated as well.  Eduard Clos, MSW, [place]

## 2013-11-13 NOTE — Clinical Social Work Psychosocial (Signed)
Clinical Social Work Department CLINICAL SOCIAL WORK PLACEMENT NOTE 11/13/2013  Patient:  [name] Fitzpatrick, [name] Fitzpatrick  Account Number:  [id number] Admit date:  11/07/2013  Clinical Social Worker:  Daiva Huge  Date/time:  11/12/2013 01:21 PM  Clinical Social Work is seeking post-discharge placement for this patient at the following level of care:   SKILLED NURSING   (*CSW will update this form in Epic as items are completed)   11/12/2013  Patient/family provided with [place] Department of Clinical Social Work's list of facilities offering this level of care within the geographic area requested by the patient (or if unable, by the patient's family).  11/12/2013  Patient/family informed of their freedom to choose among providers that offer the needed level of care, that participate in Medicare, Medicaid or managed care program needed by the patient, have an available bed and are willing to accept the patient.  11/12/2013  Patient/family informed of [facility]' ownership interest in [facility], as well as of the fact that they are under no obligation to receive care at this facility.  PASARR submitted to EDS on 11/12/2013 PASARR number received on 11/12/2013  FL2 transmitted to all facilities in geographic area requested by pt/family on  11/12/2013 FL2 transmitted to all facilities within larger geographic area on   Patient informed that his/her managed care company has contracts with or will negotiate with  certain facilities, including the following:     Patient/family informed of bed offers received:  11/13/2013 Patient chooses bed at [place] Physician recommends and patient chooses bed at    Patient to be transferred to [facility] on  11/13/2013 Patient to be transferred to facility by ems Patient and family notified of transfer on 11/13/2013 Name of family member notified:  patient requests to notify family herself  The following  physician request were entered in Epic:   Additional Comments:   Eduard Clos, MSW, [place]

## 2013-11-13 NOTE — Progress Notes (Signed)
PATIENT DETAILS Name: [name] Fitzpatrick Age: 58 y.o. Sex: female Date of Birth: 1955-06-29 Admit Date: 11/07/2013 Admitting Physician Phillips Grout, MD EYC:[id number] Moshe Cipro, MD  Subjective: Had BM yesterday, back pain slowly improving-has been now transitioned to just oral narcotics.  Assessment/Plan: Principal Problem:   Compression fracture of L1 lumbar vertebra - Secondary to a mechanical fall. She was admitted, pain control was attempted with narcotics, however because of persistent pain interventional radiology was consulted. MRI of the lumbosacral spine confirmed acute mild superior endplate compression deformity at L1. Patient subsequently underwent kyphoplasty with biopsy on 8/26. She initially required IV Dialuadid for breakthrough, she was maintained at home dose of Methadone . Slowly she was weaned off IV Dilaudid,and transitioned to oral Dilaudid for breakthrough. Patient has a long-standing history of narcotic dependence and narcotic tolerance, will need to slowly optimize/balance pain needs.She is stable to be discharged to SNF for rehab today  Active Problems: Acute respiratory distress- secondary to vocal cord dysfunction exacerbation - Occurred on 8/24, briefly transferred ICU and required BiPAP. Significantly improved with just supportive care. On oxygen via nasal cannula at 2 L per minute  COPD - Lungs clear, stable. Acute respiratory distress on 8/24 secondary to vocal cord dysfunction. - Continue with budesonide nebs, continue with scheduled doing meds. Follow clinically.  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 Coral Else for patient to mobilize with WBAT on the right ankle in a CAM boot. Recommended to followup with [place] for elective revision of arthrodesis  Chronic pain syndrome/Fibromyalgia/Chronic Low Back Pain - On chronic methadone treatment as an outpatient. See above regarding pain  management. She needs to follow up with her pain MD for further optimization as an outpattient  Hypothyroidism  -continue Synthroid, TSH normal   Anxiety/depression - Continue with Zoloft and trazodone  Disposition: Remain inpatient-SNF today  DVT Prophylaxis: Prophylactic Lovenox   Code Status: Full code   Family Communication None at bedside  Procedures:  Kyphoplasty-8/26  CONSULTS:  orthopedic surgery and IR  MEDICATIONS: Scheduled Meds: . budesonide  0.25 mg Nebulization BID  . fluticasone  2 spray Each Nare Daily  . ipratropium-albuterol  3 mL Nebulization TID  . levothyroxine  224 mcg Oral QAC breakfast  . methadone  10 mg Oral TID  . polyethylene glycol  17 g Oral BID  . senna  2 tablet Oral QHS  . sertraline  100 mg Oral QHS  . sertraline  200 mg Oral Daily  . traZODone  150 mg Oral QHS   Continuous Infusions: . sodium chloride 20 mL/hr at 11/11/13 1428   PRN Meds:.albuterol, HYDROmorphone (DILAUDID) injection, HYDROmorphone, LORazepam, methocarbamol (ROBAXIN) IV, ondansetron (ZOFRAN) IV, ondansetron, sodium phosphate  Antibiotics: Anti-infectives   Start     Dose/Rate Route Frequency Ordered Stop   11/11/13 0800  vancomycin (VANCOCIN) IVPB 1000 mg/200 mL premix     1,000 mg 200 mL/hr over 60 Minutes Intravenous On call 11/10/13 1447 11/12/13 0800   11/11/13 0758  tobramycin (NEBCIN) 1.2 G powder    Comments:  Michela Pitcher   : cabinet override      11/11/13 0758 11/11/13 0815       PHYSICAL EXAM: Vital signs in last 24 hours: Filed Vitals:   11/12/13 0627 11/12/13 1402 11/12/13 2100 11/13/13 0618  BP: 120/76 113/52 103/43 108/61  Pulse: 62 69 67 59  Temp: 98.4 F (36.9 C) 98.4 F (36.9 C) 98.6 F (  37 C) 98.2 F (36.8 C)  TempSrc: Oral Oral Oral Oral  Resp: 16 17  18   Height:      Weight:      SpO2: 98% 91%  94%    Weight change:  Filed Weights   11/08/13 0546 11/09/13 0631 11/10/13 2225  Weight: 95.936 kg (211 lb 8 oz) 95.255 kg  (210 lb) 94.394 kg (208 lb 1.6 oz)   Body mass index is 38.05 kg/(m^2).   Gen Exam: Awake and alert-but sleepy this am- clear speech.  Neck: Supple, No JVD.   Chest: B/L Clear.  No rhonchi CVS: S1 S2 Regular, no murmurs.  Abdomen: soft, BS +, non tender, non distended.  Extremities: no edema, lower extremities warm to touch. Neurologic: Non Focal.   Skin: No Rash.   Wounds: N/A.   Intake/Output from previous day:  Intake/Output Summary (Last 24 hours) at 11/13/13 0852 Last data filed at 11/12/13 1300  Gross per 24 hour  Intake      0 ml  Output      0 ml  Net      0 ml     LAB RESULTS: CBC  Recent Labs Lab 11/08/13 0020 11/08/13 0634 11/11/13 0725  WBC 8.5 5.9 8.8  HGB 11.0* 10.5* 11.6*  HCT 32.8* 31.7* 34.7*  PLT 181 149* 186  MCV 86.8 86.4 88.1  MCH 29.1 28.6 29.4  MCHC 33.5 33.1 33.4  RDW 13.3 13.2 13.2  LYMPHSABS 2.2  --   --   MONOABS 0.5  --   --   EOSABS 0.2  --   --   BASOSABS 0.0  --   --     Chemistries   Recent Labs Lab 11/08/13 0020 11/08/13 0634 11/11/13 0725  NA 138 139 141  K 4.0 4.2 4.2  CL 97 101 103  CO2 30 29 25   GLUCOSE 80 89 85  BUN 18 14 15   CREATININE 0.80 0.74 0.68  CALCIUM 9.4 9.4 9.2    CBG:  Recent Labs Lab 11/10/13 1243 11/12/13 1158 11/12/13 1654 11/12/13 2108 11/13/13 0743  GLUCAP 125* 97 95 89 86    GFR Estimated Creatinine Clearance: 83 ml/min (by C-G formula based on Cr of 0.68).  Coagulation profile  Recent Labs Lab 11/08/13 0020 11/11/13 0725  INR 0.96 1.07    Cardiac Enzymes  Recent Labs Lab 11/08/13 0020 11/09/13 1155  TROPONINI <0.30 <0.30    No components found with this basename: POCBNP,  No results found for this basename: DDIMER,  in the last 72 hours No results found for this basename: HGBA1C,  in the last 72 hours No results found for this basename: CHOL, HDL, LDLCALC, TRIG, CHOLHDL, LDLDIRECT,  in the last 72 hours No results found for this basename: TSH, T4TOTAL, FREET3,  T3FREE, THYROIDAB,  in the last 72 hours No results found for this basename: VITAMINB12, FOLATE, FERRITIN, TIBC, IRON, RETICCTPCT,  in the last 72 hours No results found for this basename: LIPASE, AMYLASE,  in the last 72 hours  Urine Studies No results found for this basename: UACOL, UAPR, USPG, UPH, UTP, UGL, UKET, UBIL, UHGB, UNIT, UROB, ULEU, UEPI, UWBC, URBC, UBAC, CAST, CRYS, UCOM, BILUA,  in the last 72 hours  MICROBIOLOGY: No results found for this or any previous visit (from the past 240 hour(s)).  RADIOLOGY STUDIES/RESULTS: Dg Lumbar Spine Complete  11/08/2013   CLINICAL DATA:  58 year old female status post fall with pain. Prior surgery. Initial encounter.  EXAM: LUMBAR SPINE -  COMPLETE 4+ VIEW  COMPARISON:  CT Abdomen and Pelvis 09/01/2010.  Chest CT 09/14/2011.  FINDINGS: Sequelae of posterior and interbody fusion re- identified in the lower lumbar spine. Lumbar segmentation appears to be normal, the operated levels are L4-L5 and L5-S1. New L1 compression fracture since 2013, primarily with superior endplate deformity. Mild loss of height. No evidence of retropulsed bone. Other lumbar levels appear stable and intact. Grossly intact visualized lower thoracic levels. Sacral ala and SI joints appear grossly intact.  IMPRESSION: 1. L1 compression fracture new since 2013, suspicious for acute injury in this setting. If specific therapy such as vertebroplasty is desired, lumbar MRI or whole-body bone scan would best confirm acuity. 2. Otherwise stable postoperative appearance of the lumbar spine.   Electronically Signed   By: Lars Pinks M.D.   On: 11/08/2013 01:42   Dg Pelvis 1-2 Views  11/08/2013   CLINICAL DATA:  Fall with back pain  EXAM: PELVIS - 1-2 VIEW  COMPARISON:  None.  FINDINGS: No evidence of pelvic ring fracture or diastasis. No evidence of hip fracture or dislocation. Irregularity of the bilateral anterior iliac wings consistent with harvest sites in this patient with lower lumbar  fusions.  IMPRESSION: No acute osseous findings.   Electronically Signed   By: Jorje Guild M.D.   On: 11/08/2013 01:36   Dg Wrist Complete Right  11/08/2013   CLINICAL DATA:  Fall  EXAM: RIGHT WRIST - COMPLETE 3+ VIEW  COMPARISON:  None.  FINDINGS: There is no evidence of fracture or dislocation. There is no evidence of arthropathy or other focal bone abnormality. Soft tissues are unremarkable.  IMPRESSION: Negative.   Electronically Signed   By: Jeannine Boga M.D.   On: 11/08/2013 01:49   Dg Ankle Complete Right  11/08/2013   CLINICAL DATA:  Fall.  EXAM: RIGHT ANKLE - COMPLETE 3+ VIEW  COMPARISON:  07/01/2011  FINDINGS: Status post attempted tibiotalar arthrodesis with plate and lag screw traversing the joint. Progressive lucency around the hardware, with an anterior plate fracture that is new. No osseous erosion or periosteal reaction strongly suspicious for infection. There has also been at a subtalar arthrodesis which appears complete. Lateral calcaneus plate and screw fixation for remote injury. No acute osseous fracture. No dislocation.  IMPRESSION: 1. Failed ankle arthrodesis with ventral plate fracture that is new from 2013. 2. Complete subtalar arthrodesis. 3. No acute osseous fracture.   Electronically Signed   By: Jorje Guild M.D.   On: 11/08/2013 01:47   Mr Lumbar Spine Wo Contrast  11/08/2013   CLINICAL DATA:  Severe low back pain status post fall yesterday. L1 compression fracture.  EXAM: MRI LUMBAR SPINE WITHOUT CONTRAST  TECHNIQUE: Multiplanar, multisequence MR imaging of the lumbar spine was performed. No intravenous contrast was administered.  COMPARISON:  Abdominal CT 09/01/2010. Lumbar spine radiographs 11/08/2013.  FINDINGS: Examination is mildly motion degraded.  Five lumbar type vertebral bodies are assumed. There are stable postsurgical changes status post L4 through S1 PLIF. As demonstrated on the earlier radiographs, there is a new mild superior endplate compression  deformity at L1. This results in approximately 15% loss of vertebral body height. There is associated marrow edema within the superior endplate. There is 3 mm of osseous retropulsion. The posterior elements are intact. No other fractures are demonstrated. The alignment is normal.  The conus medullaris extends to the L2-3 level and appears normal. No acute paraspinal abnormalities are seen. There is chronic fatty atrophy of the erector spinae musculature.  There is mild disc degeneration in the lower thoracic spine. The discs are well hydrated with maintained height at L1-2, L2-3 and L3-4. There is no evidence of spinal stenosis or nerve root encroachment.  IMPRESSION: 1. Acute mild superior endplate compression deformity at L1 with minimal associated osseous retropulsion. The posterior elements are intact. 2. No other acute osseous findings or malalignment. 3. Stable postsurgical changes status post L4 through S1 fusion.   Electronically Signed   By: Camie Patience M.D.   On: 11/08/2013 17:16   Dg Chest Port 1 View  11/10/2013   CLINICAL DATA:  Shortness of breath and productive cough, post back surgery, history diabetes, CHF, COPD  EXAM: PORTABLE CHEST - 1 VIEW  COMPARISON:  Portable exam 0846 hr compared 11/09/2013  FINDINGS: Borderline enlargement of cardiac silhouette.  Slight pulmonary vascular congestion.  Mediastinal contours normal.  Bibasilar atelectasis.  No pulmonary infiltrate, pleural effusion or pneumothorax.  IMPRESSION: Bibasilar atelectasis.   Electronically Signed   By: Lavonia Dana M.D.   On: 11/10/2013 09:00   Dg Chest Port 1 View  11/09/2013   CLINICAL DATA:  Shortness of breath.  EXAM: PORTABLE CHEST - 1 VIEW  COMPARISON:  Aug 14, 2012.  FINDINGS: Stable cardiomediastinal silhouette. No pneumothorax or pleural effusion is noted. Increased central pulmonary vascular congestion is noted. Linear densities are noted in both lung bases most consistent with subsegmental atelectasis. Bony thorax  intact.  IMPRESSION: Increased central pulmonary vascular congestion. Increased bilateral basilar linear densities are noted consistent with subsegmental atelectasis.   Electronically Signed   By: Sabino Dick M.D.   On: 11/09/2013 08:48    Oren Binet, MD  [facility] Pager:[phone]  If 7PM-7AM, please contact night-coverage [email or web address] Password [email or web address] 11/13/2013, 8:52 AM   LOS: 6 days   **Disclaimer: This note may have been dictated with voice recognition software. Similar sounding words can inadvertently be transcribed and this note may contain transcription errors which may not have been corrected upon publication of note.**

## 2013-11-14 LAB — GLUCOSE, CAPILLARY
GLUCOSE-CAPILLARY: 97 mg/dL (ref 70–99)
Glucose-Capillary: 97 mg/dL (ref 70–99)
Glucose-Capillary: 112 mg/dL — ABNORMAL HIGH (ref 70–99)
Glucose-Capillary: 104 mg/dL — ABNORMAL HIGH (ref 70–99)

## 2013-11-14 MED ORDER — ENOXAPARIN SODIUM 40 MG/0.4ML ~~LOC~~ SOLN
40.0000 mg | SUBCUTANEOUS | Status: DC
  Administered 2013-11-14 – 2013-11-16 (×3): 40 mg via SUBCUTANEOUS
  Filled 2013-11-14 (×3): qty 0.4

## 2013-11-14 NOTE — Clinical Social Work Note (Signed)
CSW continues to follow this patient for d/c planning needs. CSW spoke with Levada Dy (weekend admissions) for [facility]. Per Levada Dy, facility has yet to receive authorization. Levada Dy stated she will follow-up with CSW.   [place], [place] Weekend Clinical Social Worker [phone]

## 2013-11-14 NOTE — Progress Notes (Signed)
PATIENT DETAILS Name: [name] Fitzpatrick Age: 58 y.o. Sex: female Date of Birth: [birth year] Admit Date: 11/07/2013 Admitting Physician Phillips Grout, MD [id number] Moshe Cipro, MD  Subjective: Back pain slowly improving. No major issues. Awaiting insurance authorization for SNF.   Assessment/Plan: Principal Problem:   Compression fracture of L1 lumbar vertebra - Secondary to a mechanical fall. She was admitted, pain control was attempted with narcotics, however because of persistent pain interventional radiology was consulted. MRI of the lumbosacral spine confirmed acute mild superior endplate compression deformity at L1. Patient subsequently underwent kyphoplasty with biopsy on 8/26. She initially required IV Dialuadid for breakthrough, she was maintained at home dose of Methadone . Slowly she was weaned off IV Dilaudid,and transitioned to oral Dilaudid for breakthrough. Patient has a long-standing history of narcotic dependence and narcotic tolerance, will need to slowly optimize/balance pain needs.She is stable to be discharged to [facility] awaiting insurance authorization  Active Problems: Acute respiratory distress- secondary to vocal cord dysfunction exacerbation - Occurred on 8/24, briefly transferred ICU and required BiPAP. Significantly improved with just supportive care. On oxygen via nasal cannula at 2 L per minute  COPD - Lungs clear, stable. Acute respiratory distress on 8/24 secondary to vocal cord dysfunction. - Continue with budesonide nebs, continue with scheduled doing meds. Follow clinically.  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 Coral Else for patient to mobilize with WBAT on the right ankle in a CAM boot. Recommended to followup with [place] for elective revision of arthrodesis  Chronic pain syndrome/Fibromyalgia/Chronic Low Back Pain - On chronic methadone treatment as an outpatient. See  above regarding pain management. She needs to follow up with her pain MD for further optimization as an outpattient  Hypothyroidism  -continue Synthroid, TSH normal   Anxiety/depression - Continue with Zoloft and trazodone  Disposition: Remain inpatient-SNF when insurance authorization approved  DVT Prophylaxis: Prophylactic Lovenox   Code Status: Full code   Family Communication None at bedside  Procedures:  Kyphoplasty-8/26  CONSULTS:  orthopedic surgery and IR  MEDICATIONS: Scheduled Meds: . budesonide  0.25 mg Nebulization BID  . fluticasone  2 spray Each Nare Daily  . ipratropium-albuterol  3 mL Nebulization TID  . levothyroxine  224 mcg Oral QAC breakfast  . methadone  10 mg Oral TID  . polyethylene glycol  17 g Oral BID  . senna  2 tablet Oral QHS  . sertraline  100 mg Oral QHS  . sertraline  200 mg Oral Daily  . traZODone  150 mg Oral QHS   Continuous Infusions: . sodium chloride 20 mL/hr at 11/11/13 1428   PRN Meds:.albuterol, HYDROmorphone, LORazepam, methocarbamol (ROBAXIN) IV, ondansetron (ZOFRAN) IV, ondansetron, sodium phosphate  Antibiotics: Anti-infectives   Start     Dose/Rate Route Frequency Ordered Stop   11/11/13 0800  vancomycin (VANCOCIN) IVPB 1000 mg/200 mL premix     1,000 mg 200 mL/hr over 60 Minutes Intravenous On call 11/10/13 1447 11/12/13 0800   11/11/13 0758  tobramycin (NEBCIN) 1.2 G powder    Comments:  Michela Pitcher   : cabinet override      11/11/13 0758 11/11/13 0815       PHYSICAL EXAM: Vital signs in last 24 hours: Filed Vitals:   11/13/13 1308 11/13/13 1352 11/13/13 2144 11/14/13 0501  BP:  106/56 108/63 96/65  Pulse:  72 60 67  Temp:  97.9 F (36.6 C) 98.6 F (37 C) 98.2 F (  36.8 C)  TempSrc:  Oral Oral Oral  Resp:  16 16 16   Height:      Weight:      SpO2: 97% 97% 100% 95%    Weight change:  Filed Weights   11/08/13 0546 11/09/13 0631 11/10/13 2225  Weight: 95.936 kg (211 lb 8 oz) 95.255 kg (210 lb)  94.394 kg (208 lb 1.6 oz)   Body mass index is 38.05 kg/(m^2).   Gen Exam: Awake and alert-up at bedside eating breakfast Neck: Supple, No JVD.   Chest: B/L Clear.  No rhonchi or rales CVS: S1 S2 Regular, no murmurs.  Abdomen: soft, BS +, non tender, non distended.  Extremities: no edema, lower extremities warm to touch. Neurologic: Non Focal.   Skin: No Rash.   Wounds: N/A.   Intake/Output from previous day: No intake or output data in the 24 hours ending 11/14/13 1101   LAB RESULTS: CBC  Recent Labs Lab 11/08/13 0020 11/08/13 0634 11/11/13 0725  WBC 8.5 5.9 8.8  HGB 11.0* 10.5* 11.6*  HCT 32.8* 31.7* 34.7*  PLT 181 149* 186  MCV 86.8 86.4 88.1  MCH 29.1 28.6 29.4  MCHC 33.5 33.1 33.4  RDW 13.3 13.2 13.2  LYMPHSABS 2.2  --   --   MONOABS 0.5  --   --   EOSABS 0.2  --   --   BASOSABS 0.0  --   --     Chemistries   Recent Labs Lab 11/08/13 0020 11/08/13 0634 11/11/13 0725  NA 138 139 141  K 4.0 4.2 4.2  CL 97 101 103  CO2 30 29 25   GLUCOSE 80 89 85  BUN 18 14 15   CREATININE 0.80 0.74 0.68  CALCIUM 9.4 9.4 9.2    CBG:  Recent Labs Lab 11/13/13 0743 11/13/13 1155 11/13/13 1715 11/13/13 2143 11/14/13 0738  GLUCAP 86 115* 114* 96 97    GFR Estimated Creatinine Clearance: 83 ml/min (by C-G formula based on Cr of 0.68).  Coagulation profile  Recent Labs Lab 11/08/13 0020 11/11/13 0725  INR 0.96 1.07    Cardiac Enzymes  Recent Labs Lab 11/08/13 0020 11/09/13 1155  TROPONINI <0.30 <0.30    No components found with this basename: POCBNP,  No results found for this basename: DDIMER,  in the last 72 hours No results found for this basename: HGBA1C,  in the last 72 hours No results found for this basename: CHOL, HDL, LDLCALC, TRIG, CHOLHDL, LDLDIRECT,  in the last 72 hours No results found for this basename: TSH, T4TOTAL, FREET3, T3FREE, THYROIDAB,  in the last 72 hours No results found for this basename: VITAMINB12, FOLATE, FERRITIN,  TIBC, IRON, RETICCTPCT,  in the last 72 hours No results found for this basename: LIPASE, AMYLASE,  in the last 72 hours  Urine Studies No results found for this basename: UACOL, UAPR, USPG, UPH, UTP, UGL, UKET, UBIL, UHGB, UNIT, UROB, ULEU, UEPI, UWBC, URBC, UBAC, CAST, CRYS, UCOM, BILUA,  in the last 72 hours  MICROBIOLOGY: No results found for this or any previous visit (from the past 240 hour(s)).  RADIOLOGY STUDIES/RESULTS: Dg Lumbar Spine Complete  11/08/2013   CLINICAL DATA:  58 year old female status post fall with pain. Prior surgery. Initial encounter.  EXAM: LUMBAR SPINE - COMPLETE 4+ VIEW  COMPARISON:  CT Abdomen and Pelvis 09/01/2010.  Chest CT 09/14/2011.  FINDINGS: Sequelae of posterior and interbody fusion re- identified in the lower lumbar spine. Lumbar segmentation appears to be normal, the operated levels are  L4-L5 and L5-S1. New L1 compression fracture since 2013, primarily with superior endplate deformity. Mild loss of height. No evidence of retropulsed bone. Other lumbar levels appear stable and intact. Grossly intact visualized lower thoracic levels. Sacral ala and SI joints appear grossly intact.  IMPRESSION: 1. L1 compression fracture new since 2013, suspicious for acute injury in this setting. If specific therapy such as vertebroplasty is desired, lumbar MRI or whole-body bone scan would best confirm acuity. 2. Otherwise stable postoperative appearance of the lumbar spine.   Electronically Signed   By: Lars Pinks M.D.   On: 11/08/2013 01:42   Dg Pelvis 1-2 Views  11/08/2013   CLINICAL DATA:  Fall with back pain  EXAM: PELVIS - 1-2 VIEW  COMPARISON:  None.  FINDINGS: No evidence of pelvic ring fracture or diastasis. No evidence of hip fracture or dislocation. Irregularity of the bilateral anterior iliac wings consistent with harvest sites in this patient with lower lumbar fusions.  IMPRESSION: No acute osseous findings.   Electronically Signed   By: Jorje Guild M.D.   On:  11/08/2013 01:36   Dg Wrist Complete Right  11/08/2013   CLINICAL DATA:  Fall  EXAM: RIGHT WRIST - COMPLETE 3+ VIEW  COMPARISON:  None.  FINDINGS: There is no evidence of fracture or dislocation. There is no evidence of arthropathy or other focal bone abnormality. Soft tissues are unremarkable.  IMPRESSION: Negative.   Electronically Signed   By: Jeannine Boga M.D.   On: 11/08/2013 01:49   Dg Ankle Complete Right  11/08/2013   CLINICAL DATA:  Fall.  EXAM: RIGHT ANKLE - COMPLETE 3+ VIEW  COMPARISON:  07/01/2011  FINDINGS: Status post attempted tibiotalar arthrodesis with plate and lag screw traversing the joint. Progressive lucency around the hardware, with an anterior plate fracture that is new. No osseous erosion or periosteal reaction strongly suspicious for infection. There has also been at a subtalar arthrodesis which appears complete. Lateral calcaneus plate and screw fixation for remote injury. No acute osseous fracture. No dislocation.  IMPRESSION: 1. Failed ankle arthrodesis with ventral plate fracture that is new from 2013. 2. Complete subtalar arthrodesis. 3. No acute osseous fracture.   Electronically Signed   By: Jorje Guild M.D.   On: 11/08/2013 01:47   Mr Lumbar Spine Wo Contrast  11/08/2013   CLINICAL DATA:  Severe low back pain status post fall yesterday. L1 compression fracture.  EXAM: MRI LUMBAR SPINE WITHOUT CONTRAST  TECHNIQUE: Multiplanar, multisequence MR imaging of the lumbar spine was performed. No intravenous contrast was administered.  COMPARISON:  Abdominal CT 09/01/2010. Lumbar spine radiographs 11/08/2013.  FINDINGS: Examination is mildly motion degraded.  Five lumbar type vertebral bodies are assumed. There are stable postsurgical changes status post L4 through S1 PLIF. As demonstrated on the earlier radiographs, there is a new mild superior endplate compression deformity at L1. This results in approximately 15% loss of vertebral body height. There is associated  marrow edema within the superior endplate. There is 3 mm of osseous retropulsion. The posterior elements are intact. No other fractures are demonstrated. The alignment is normal.  The conus medullaris extends to the L2-3 level and appears normal. No acute paraspinal abnormalities are seen. There is chronic fatty atrophy of the erector spinae musculature.  There is mild disc degeneration in the lower thoracic spine. The discs are well hydrated with maintained height at L1-2, L2-3 and L3-4. There is no evidence of spinal stenosis or nerve root encroachment.  IMPRESSION: 1. Acute mild superior  endplate compression deformity at L1 with minimal associated osseous retropulsion. The posterior elements are intact. 2. No other acute osseous findings or malalignment. 3. Stable postsurgical changes status post L4 through S1 fusion.   Electronically Signed   By: Camie Patience M.D.   On: 11/08/2013 17:16   Dg Chest Port 1 View  11/10/2013   CLINICAL DATA:  Shortness of breath and productive cough, post back surgery, history diabetes, CHF, COPD  EXAM: PORTABLE CHEST - 1 VIEW  COMPARISON:  Portable exam 0846 hr compared 11/09/2013  FINDINGS: Borderline enlargement of cardiac silhouette.  Slight pulmonary vascular congestion.  Mediastinal contours normal.  Bibasilar atelectasis.  No pulmonary infiltrate, pleural effusion or pneumothorax.  IMPRESSION: Bibasilar atelectasis.   Electronically Signed   By: Lavonia Dana M.D.   On: 11/10/2013 09:00   Dg Chest Port 1 View  11/09/2013   CLINICAL DATA:  Shortness of breath.  EXAM: PORTABLE CHEST - 1 VIEW  COMPARISON:  Aug 14, 2012.  FINDINGS: Stable cardiomediastinal silhouette. No pneumothorax or pleural effusion is noted. Increased central pulmonary vascular congestion is noted. Linear densities are noted in both lung bases most consistent with subsegmental atelectasis. Bony thorax intact.  IMPRESSION: Increased central pulmonary vascular congestion. Increased bilateral basilar linear  densities are noted consistent with subsegmental atelectasis.   Electronically Signed   By: Sabino Dick M.D.   On: 11/09/2013 08:48    Oren Binet, MD  [facility] Pager:[phone]  If 7PM-7AM, please contact night-coverage [email or web address] Password [email or web address] 11/14/2013, 11:01 AM   LOS: 7 days   **Disclaimer: This note may have been dictated with voice recognition software. Similar sounding words can inadvertently be transcribed and this note may contain transcription errors which may not have been corrected upon publication of note.**

## 2013-11-15 LAB — GLUCOSE, CAPILLARY
GLUCOSE-CAPILLARY: 106 mg/dL — AB (ref 70–99)
GLUCOSE-CAPILLARY: 106 mg/dL — AB (ref 70–99)
GLUCOSE-CAPILLARY: 123 mg/dL — AB (ref 70–99)
GLUCOSE-CAPILLARY: 119 mg/dL — AB (ref 70–99)

## 2013-11-15 MED ORDER — METHADONE HCL 5 MG PO TABS
12.5000 mg | ORAL_TABLET | Freq: Three times a day (TID) | ORAL | Status: DC
  Administered 2013-11-15: 12.5 mg via ORAL
  Filled 2013-11-15 (×2): qty 1

## 2013-11-15 MED ORDER — HYDROMORPHONE HCL PF 1 MG/ML IJ SOLN
2.0000 mg | INTRAMUSCULAR | Status: DC | PRN
  Administered 2013-11-15 – 2013-11-16 (×5): 2 mg via INTRAVENOUS
  Filled 2013-11-15 (×6): qty 2

## 2013-11-15 NOTE — Progress Notes (Signed)
Awaiting IV team for new IV start. Current site very tender. Patient currently sleeping.

## 2013-11-15 NOTE — Progress Notes (Signed)
Pt c/o of no pain while flush current iv site. States she wants to leave it in for now.

## 2013-11-15 NOTE — Progress Notes (Signed)
Pt c/o of more pain than usual. Pain medication has been given along with ice. Ghimire MD has been made aware of pain. Will wait for further orders.

## 2013-11-15 NOTE — Progress Notes (Signed)
PATIENT DETAILS Name: [name] Fitzpatrick Age: 58 y.o. Sex: female Date of Birth: 1955-05-15 Admit Date: 11/07/2013 Admitting Physician Phillips Grout, MD WUJ:[id number] Moshe Cipro, MD  Subjective: Back pain still ongoing-but better than on initial presentation. No major issues. Awaiting insurance authorization for SNF.   Assessment/Plan: Principal Problem:   Compression fracture of L1 lumbar vertebra - Secondary to a mechanical fall. She was admitted, pain control was attempted with narcotics, however because of persistent pain interventional radiology was consulted. MRI of the lumbosacral spine confirmed acute mild superior endplate compression deformity at L1. Patient subsequently underwent kyphoplasty with biopsy on 8/26. She initially required IV Diluadid for breakthrough, she was maintained at home dose of Methadone . Slowly she was weaned off IV Dilaudid,and transitioned to oral Dilaudid for breakthrough-which seems to be controlling her pain. Patient has a long-standing history of narcotic dependence and narcotic tolerance, will need to slowly optimize/balance pain needs.She is stable to be discharged to [facility] awaiting insurance authorization.   Active Problems: Acute respiratory distress- secondary to vocal cord dysfunction exacerbation - Occurred on 8/24, briefly transferred ICU and required BiPAP. Significantly improved with just supportive care. On oxygen via nasal cannula at 2 L per minute  COPD - Lungs clear, stable. Acute respiratory distress on 8/24 secondary to vocal cord dysfunction. - Continue with budesonide nebs, continue with scheduled doing meds. Follow clinically.  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 Coral Else for patient to mobilize with WBAT on the right ankle in a CAM boot. Recommended to followup with [place] for elective revision of arthrodesis  Chronic pain  syndrome/Fibromyalgia/Chronic Low Back Pain - On chronic methadone treatment as an outpatient. See above regarding pain management. She needs to follow up with her pain MD for further optimization as an outpattient  Hypothyroidism  -continue Synthroid, TSH normal   Anxiety/depression - Continue with Zoloft and trazodone  Disposition: Remain inpatient-SNF when insurance authorization approved  DVT Prophylaxis: Prophylactic Lovenox   Code Status: Full code   Family Communication None at bedside  Procedures:  Kyphoplasty-8/26  CONSULTS:  orthopedic surgery and IR  MEDICATIONS: Scheduled Meds: . budesonide  0.25 mg Nebulization BID  . enoxaparin (LOVENOX) injection  40 mg Subcutaneous Q24H  . fluticasone  2 spray Each Nare Daily  . ipratropium-albuterol  3 mL Nebulization TID  . levothyroxine  224 mcg Oral QAC breakfast  . methadone  10 mg Oral TID  . polyethylene glycol  17 g Oral BID  . senna  2 tablet Oral QHS  . sertraline  100 mg Oral QHS  . sertraline  200 mg Oral Daily  . traZODone  150 mg Oral QHS   Continuous Infusions:   PRN Meds:.albuterol, HYDROmorphone, LORazepam, methocarbamol (ROBAXIN) IV, ondansetron (ZOFRAN) IV, ondansetron, sodium phosphate  Antibiotics: Anti-infectives   Start     Dose/Rate Route Frequency Ordered Stop   11/11/13 0800  vancomycin (VANCOCIN) IVPB 1000 mg/200 mL premix     1,000 mg 200 mL/hr over 60 Minutes Intravenous On call 11/10/13 1447 11/12/13 0800   11/11/13 0758  tobramycin (NEBCIN) 1.2 G powder    Comments:  Michela Pitcher   : cabinet override      11/11/13 0758 11/11/13 0815       PHYSICAL EXAM: Vital signs in last 24 hours: Filed Vitals:   11/14/13 0501 11/14/13 1300 11/14/13 2151 11/15/13 0616  BP: 96/65 100/60 112/70 102/72  Pulse: 67 81 83  80  Temp: 98.2 F (36.8 C) 98.4 F (36.9 C) 98.5 F (36.9 C) 98.3 F (36.8 C)  TempSrc: Oral Oral Oral Oral  Resp: 16 18 18 18   Height:      Weight:      SpO2: 95%  97% 94% 95%    Weight change:  Filed Weights   11/08/13 0546 11/09/13 0631 11/10/13 2225  Weight: 95.936 kg (211 lb 8 oz) 95.255 kg (210 lb) 94.394 kg (208 lb 1.6 oz)   Body mass index is 38.05 kg/(m^2).   Gen Exam: Awake and alert-up at bedside eating breakfast in the chair. Neck: Supple, No JVD.   Chest: B/L Clear.  No rhonchi or rales CVS: S1 S2 Regular, no murmurs.  Abdomen: soft, BS +, non tender, non distended.  Extremities: no edema, lower extremities warm to touch. Neurologic: Non Focal.   Skin: No Rash.   Wounds: N/A.   Intake/Output from previous day:  Intake/Output Summary (Last 24 hours) at 11/15/13 1018 Last data filed at 11/14/13 1419  Gross per 24 hour  Intake    480 ml  Output      0 ml  Net    480 ml     LAB RESULTS: CBC  Recent Labs Lab 11/11/13 0725  WBC 8.8  HGB 11.6*  HCT 34.7*  PLT 186  MCV 88.1  MCH 29.4  MCHC 33.4  RDW 13.2    Chemistries   Recent Labs Lab 11/11/13 0725  NA 141  K 4.2  CL 103  CO2 25  GLUCOSE 85  BUN 15  CREATININE 0.68  CALCIUM 9.2    CBG:  Recent Labs Lab 11/14/13 0738 11/14/13 1152 11/14/13 1639 11/14/13 2138 11/15/13 0747  GLUCAP 97 112* 104* 97 106*    GFR Estimated Creatinine Clearance: 83 ml/min (by C-G formula based on Cr of 0.68).  Coagulation profile  Recent Labs Lab 11/11/13 0725  INR 1.07    Cardiac Enzymes  Recent Labs Lab 11/09/13 1155  TROPONINI <0.30    No components found with this basename: POCBNP,  No results found for this basename: DDIMER,  in the last 72 hours No results found for this basename: HGBA1C,  in the last 72 hours No results found for this basename: CHOL, HDL, LDLCALC, TRIG, CHOLHDL, LDLDIRECT,  in the last 72 hours No results found for this basename: TSH, T4TOTAL, FREET3, T3FREE, THYROIDAB,  in the last 72 hours No results found for this basename: VITAMINB12, FOLATE, FERRITIN, TIBC, IRON, RETICCTPCT,  in the last 72 hours No results found for  this basename: LIPASE, AMYLASE,  in the last 72 hours  Urine Studies No results found for this basename: UACOL, UAPR, USPG, UPH, UTP, UGL, UKET, UBIL, UHGB, UNIT, UROB, ULEU, UEPI, UWBC, URBC, UBAC, CAST, CRYS, UCOM, BILUA,  in the last 72 hours  MICROBIOLOGY: No results found for this or any previous visit (from the past 240 hour(s)).  RADIOLOGY STUDIES/RESULTS: Dg Lumbar Spine Complete  11/08/2013   CLINICAL DATA:  58 year old female status post fall with pain. Prior surgery. Initial encounter.  EXAM: LUMBAR SPINE - COMPLETE 4+ VIEW  COMPARISON:  CT Abdomen and Pelvis 09/01/2010.  Chest CT 09/14/2011.  FINDINGS: Sequelae of posterior and interbody fusion re- identified in the lower lumbar spine. Lumbar segmentation appears to be normal, the operated levels are L4-L5 and L5-S1. New L1 compression fracture since 2013, primarily with superior endplate deformity. Mild loss of height. No evidence of retropulsed bone. Other lumbar levels appear stable and  intact. Grossly intact visualized lower thoracic levels. Sacral ala and SI joints appear grossly intact.  IMPRESSION: 1. L1 compression fracture new since 2013, suspicious for acute injury in this setting. If specific therapy such as vertebroplasty is desired, lumbar MRI or whole-body bone scan would best confirm acuity. 2. Otherwise stable postoperative appearance of the lumbar spine.   Electronically Signed   By: Lars Pinks M.D.   On: 11/08/2013 01:42   Dg Pelvis 1-2 Views  11/08/2013   CLINICAL DATA:  Fall with back pain  EXAM: PELVIS - 1-2 VIEW  COMPARISON:  None.  FINDINGS: No evidence of pelvic ring fracture or diastasis. No evidence of hip fracture or dislocation. Irregularity of the bilateral anterior iliac wings consistent with harvest sites in this patient with lower lumbar fusions.  IMPRESSION: No acute osseous findings.   Electronically Signed   By: Jorje Guild M.D.   On: 11/08/2013 01:36   Dg Wrist Complete Right  11/08/2013   CLINICAL  DATA:  Fall  EXAM: RIGHT WRIST - COMPLETE 3+ VIEW  COMPARISON:  None.  FINDINGS: There is no evidence of fracture or dislocation. There is no evidence of arthropathy or other focal bone abnormality. Soft tissues are unremarkable.  IMPRESSION: Negative.   Electronically Signed   By: Jeannine Boga M.D.   On: 11/08/2013 01:49   Dg Ankle Complete Right  11/08/2013   CLINICAL DATA:  Fall.  EXAM: RIGHT ANKLE - COMPLETE 3+ VIEW  COMPARISON:  07/01/2011  FINDINGS: Status post attempted tibiotalar arthrodesis with plate and lag screw traversing the joint. Progressive lucency around the hardware, with an anterior plate fracture that is new. No osseous erosion or periosteal reaction strongly suspicious for infection. There has also been at a subtalar arthrodesis which appears complete. Lateral calcaneus plate and screw fixation for remote injury. No acute osseous fracture. No dislocation.  IMPRESSION: 1. Failed ankle arthrodesis with ventral plate fracture that is new from 2013. 2. Complete subtalar arthrodesis. 3. No acute osseous fracture.   Electronically Signed   By: Jorje Guild M.D.   On: 11/08/2013 01:47   Mr Lumbar Spine Wo Contrast  11/08/2013   CLINICAL DATA:  Severe low back pain status post fall yesterday. L1 compression fracture.  EXAM: MRI LUMBAR SPINE WITHOUT CONTRAST  TECHNIQUE: Multiplanar, multisequence MR imaging of the lumbar spine was performed. No intravenous contrast was administered.  COMPARISON:  Abdominal CT 09/01/2010. Lumbar spine radiographs 11/08/2013.  FINDINGS: Examination is mildly motion degraded.  Five lumbar type vertebral bodies are assumed. There are stable postsurgical changes status post L4 through S1 PLIF. As demonstrated on the earlier radiographs, there is a new mild superior endplate compression deformity at L1. This results in approximately 15% loss of vertebral body height. There is associated marrow edema within the superior endplate. There is 3 mm of osseous  retropulsion. The posterior elements are intact. No other fractures are demonstrated. The alignment is normal.  The conus medullaris extends to the L2-3 level and appears normal. No acute paraspinal abnormalities are seen. There is chronic fatty atrophy of the erector spinae musculature.  There is mild disc degeneration in the lower thoracic spine. The discs are well hydrated with maintained height at L1-2, L2-3 and L3-4. There is no evidence of spinal stenosis or nerve root encroachment.  IMPRESSION: 1. Acute mild superior endplate compression deformity at L1 with minimal associated osseous retropulsion. The posterior elements are intact. 2. No other acute osseous findings or malalignment. 3. Stable postsurgical changes status post  L4 through S1 fusion.   Electronically Signed   By: Camie Patience M.D.   On: 11/08/2013 17:16   Dg Chest Port 1 View  11/10/2013   CLINICAL DATA:  Shortness of breath and productive cough, post back surgery, history diabetes, CHF, COPD  EXAM: PORTABLE CHEST - 1 VIEW  COMPARISON:  Portable exam 0846 hr compared 11/09/2013  FINDINGS: Borderline enlargement of cardiac silhouette.  Slight pulmonary vascular congestion.  Mediastinal contours normal.  Bibasilar atelectasis.  No pulmonary infiltrate, pleural effusion or pneumothorax.  IMPRESSION: Bibasilar atelectasis.   Electronically Signed   By: Lavonia Dana M.D.   On: 11/10/2013 09:00   Dg Chest Port 1 View  11/09/2013   CLINICAL DATA:  Shortness of breath.  EXAM: PORTABLE CHEST - 1 VIEW  COMPARISON:  Aug 14, 2012.  FINDINGS: Stable cardiomediastinal silhouette. No pneumothorax or pleural effusion is noted. Increased central pulmonary vascular congestion is noted. Linear densities are noted in both lung bases most consistent with subsegmental atelectasis. Bony thorax intact.  IMPRESSION: Increased central pulmonary vascular congestion. Increased bilateral basilar linear densities are noted consistent with subsegmental atelectasis.    Electronically Signed   By: Sabino Dick M.D.   On: 11/09/2013 08:48    Oren Binet, MD  [facility] Pager:[phone]  If 7PM-7AM, please contact night-coverage [email or web address] Password [email or web address] 11/15/2013, 10:18 AM   LOS: 8 days   **Disclaimer: This note may have been dictated with voice recognition software. Similar sounding words can inadvertently be transcribed and this note may contain transcription errors which may not have been corrected upon publication of note.**

## 2013-11-16 LAB — GLUCOSE, CAPILLARY
Glucose-Capillary: 99 mg/dL (ref 70–99)
Glucose-Capillary: 113 mg/dL — ABNORMAL HIGH (ref 70–99)
Glucose-Capillary: 105 mg/dL — ABNORMAL HIGH (ref 70–99)

## 2013-11-16 MED ORDER — METHADONE HCL 5 MG PO TABS
15.0000 mg | ORAL_TABLET | Freq: Three times a day (TID) | ORAL | Status: DC
  Administered 2013-11-16 (×2): 15 mg via ORAL
  Filled 2013-11-16 (×4): qty 1

## 2013-11-16 MED ORDER — METHADONE HCL 10 MG PO TABS: 15.0000 mg | ORAL_TABLET | Freq: Three times a day (TID) | ORAL | Status: DC

## 2013-11-16 NOTE — Clinical Social Work Placement (Signed)
Clinical Social Work Department CLINICAL SOCIAL WORK PLACEMENT NOTE 11/16/2013  Patient:  [name] Fitzpatrick, [name] Fitzpatrick  Account Number:  [id number] Admit date:  11/07/2013  Clinical Social Worker:  Daiva Huge  Date/time:  11/12/2013 01:21 PM  Clinical Social Work is seeking post-discharge placement for this patient at the following level of care:   SKILLED NURSING   (*CSW will update this form in Epic as items are completed)   11/12/2013  Patient/family provided with [place] Department of Clinical Social Work's list of facilities offering this level of care within the geographic area requested by the patient (or if unable, by the patient's family).  11/12/2013  Patient/family informed of their freedom to choose among providers that offer the needed level of care, that participate in Medicare, Medicaid or managed care program needed by the patient, have an available bed and are willing to accept the patient.  11/12/2013  Patient/family informed of [facility]' ownership interest in [facility], as well as of the fact that they are under no obligation to receive care at this facility.  PASARR submitted to EDS on 11/12/2013 PASARR number received on 11/12/2013  FL2 transmitted to all facilities in geographic area requested by pt/family on  11/12/2013 FL2 transmitted to all facilities within larger geographic area on   Patient informed that his/her managed care company has contracts with or will negotiate with  certain facilities, including the following:     Patient/family informed of bed offers received:  11/13/2013 Patient chooses bed at [facility] Physician recommends and patient chooses bed at    Patient to be transferred to [facility] on  11/13/2013 Patient to be transferred to facility by ems Patient and family notified of transfer on 11/13/2013 Name of family member notified:  patient requests to notify family herself  The following  physician request were entered in Epic:   Additional Comments:    Per MD patient ready for DC to [facility]. RN, patient, patient's family (patient states she will notify family), and facility notified of DC. RN given number for report. DC packet on chart. [facility] transport requested for patient. CSW signing off.    Liz Beach MSW, [place], [place], [phone]

## 2013-11-16 NOTE — Progress Notes (Signed)
[facility] arrive to transport pt to [facility]. Pt in no s/s of distress. Pain medicine admin prior to pt's departure per pt request. VS stable. IV removed - skin c/d/i. Discharge paperwork reviewed with pt by dayshift nurse. Paperwork given to [employer]. Dayshift nurse called report to [facility]. Patient's belongings sent with pt.

## 2013-11-16 NOTE — Progress Notes (Signed)
Report given to Rosa, RN.

## 2013-11-16 NOTE — Progress Notes (Signed)
Physical Therapy Treatment Patient Details Name: [name] Fitzpatrick MRN: [id number] DOB: [birth year] Today's Date: 11/16/2013    History of Present Illness 58 yo WF with hx of intubations for stridor, copd, chronic pain on methadone, fibromyalgia, anxiety disorder, suicide attempts who fell and was admitted 8/23 post fall with lumbar compression fx. She developed acute resp distress 8/24, PCCM came to room at Rapid response team request. She was placed on NIMVS, sedated and moved to ICU. Hopefully we will avoid intubation but she may need intubation if refractory to current treatments. S/p L1 balloon kyphoplasty 11/11/13.    PT Comments    Pt continues to be greatly limited by pain. requires max encouragement to increase mobility. Setup in recliner for bath; nursing tech made aware. Cont to recommend SNF for post acute rehab.   Follow Up Recommendations  SNF;Supervision/Assistance - 24 hour     Equipment Recommendations  None recommended by PT    Recommendations for Other Services       Precautions / Restrictions Precautions Precautions: Fall;Back Precaution Comments: pt unable to recall back precautions; stated ' ive had back problems for years and nobody has ever told me this" Required Braces or Orthoses: Other Brace/Splint Other Brace/Splint: CAM boot right LE and right wrist Futura splint Restrictions Weight Bearing Restrictions: Yes RUE Weight Bearing: Weight bearing as tolerated RLE Weight Bearing: Weight bearing as tolerated Other Position/Activity Restrictions: Rt LE WBAT with CAM boot only     Mobility  Bed Mobility Overal bed mobility: Needs Assistance Bed Mobility: Rolling;Sidelying to Sit Rolling: Supervision Sidelying to sit: Supervision       General bed mobility comments: cues for log rolling technique and safety; incr time due to pain  Transfers Overall transfer level: Needs assistance Equipment used: Rolling walker (2 wheeled) Transfers: Sit to/from  Stand Sit to Stand: Min guard         General transfer comment: pt required min guard to steady and cues for hand placement and safety with RW; cues for back precautions   Ambulation/Gait Ambulation/Gait assistance: Min guard Ambulation Distance (Feet): 30 Feet Assistive device: Rolling walker (2 wheeled) Gait Pattern/deviations: Step-through pattern;Decreased stance time - right;Decreased step length - left;Trendelenburg;Trunk flexed;Wide base of support Gait velocity: Decreased Gait velocity interpretation: Below normal speed for age/gender General Gait Details: pt limited but pain; tactile cues for upright posture and min guard to steady   Stairs            Wheelchair Mobility    Modified Rankin (Stroke Patients Only)       Balance Overall balance assessment: Needs assistance;History of Falls Sitting-balance support: Feet supported;No upper extremity supported Sitting balance-Leahy Scale: Good     Standing balance support: During functional activity;Bilateral upper extremity supported Standing balance-Leahy Scale: Poor Standing balance comment: relied on RW for UE support              High level balance activites: Direction changes High Level Balance Comments: required min guard to steady and manage RW     Cognition Arousal/Alertness: Awake/alert Behavior During Therapy: WFL for tasks assessed/performed Overall Cognitive Status: Within Functional Limits for tasks assessed                      Exercises      General Comments General comments (skin integrity, edema, etc.): required total (A) to don/doff CAM boot; encouraged OOB for meals      Pertinent Vitals/Pain Pain Assessment: 0-10 Pain Score: 8  Pain Location: Rt  ankle and low back Pain Descriptors / Indicators: Aching;Constant Pain Intervention(s): Limited activity within patient's tolerance;RN gave pain meds during session;Repositioned;Monitored during session    Home Living                       Prior Function            PT Goals (current goals can now be found in the care plan section) Acute Rehab PT Goals Patient Stated Goal: to get better PT Goal Formulation: With patient Time For Goal Achievement: 11/24/13 Potential to Achieve Goals: Good Progress towards PT goals: Progressing toward goals    Frequency  Min 3X/week    PT Plan Current plan remains appropriate    Co-evaluation             End of Session Equipment Utilized During Treatment: Gait belt;Oxygen;Other (comment) (CAM boot and wrist splint ) Activity Tolerance: Patient limited by pain Patient left: in chair;with call bell/phone within reach     Time: 0940-1005 PT Time Calculation (min): 25 min  Charges:  [employer] Training: 23-37 mins                    G CodesGustavus Bryant, [place] 11/16/2013, 12:14 PM

## 2013-11-17 ENCOUNTER — Emergency Department (HOSPITAL_COMMUNITY)
Admission: EM | Admit: 2013-11-17 | Discharge: 2013-11-17 | Disposition: A | Discharge status: Home / Self Care | Payer: Medicare FFS | Attending: Emergency Medicine | Admitting: Emergency Medicine

## 2013-11-17 ENCOUNTER — Encounter (HOSPITAL_COMMUNITY): Payer: Self-pay | Admitting: Emergency Medicine

## 2013-11-17 ENCOUNTER — Emergency Department (HOSPITAL_COMMUNITY): Payer: Medicare FFS

## 2013-11-17 DIAGNOSIS — F3289 Other specified depressive episodes: Secondary | ICD-10-CM | POA: Insufficient documentation

## 2013-11-17 DIAGNOSIS — Z87891 Personal history of nicotine dependence: Secondary | ICD-10-CM | POA: Diagnosis not present

## 2013-11-17 DIAGNOSIS — G8929 Other chronic pain: Secondary | ICD-10-CM | POA: Insufficient documentation

## 2013-11-17 DIAGNOSIS — M545 Low back pain, unspecified: Secondary | ICD-10-CM

## 2013-11-17 DIAGNOSIS — W06XXXA Fall from bed, initial encounter: Secondary | ICD-10-CM | POA: Diagnosis not present

## 2013-11-17 DIAGNOSIS — F411 Generalized anxiety disorder: Secondary | ICD-10-CM | POA: Diagnosis not present

## 2013-11-17 DIAGNOSIS — M62838 Other muscle spasm: Secondary | ICD-10-CM | POA: Insufficient documentation

## 2013-11-17 DIAGNOSIS — E119 Type 2 diabetes mellitus without complications: Secondary | ICD-10-CM | POA: Diagnosis not present

## 2013-11-17 DIAGNOSIS — J449 Chronic obstructive pulmonary disease, unspecified: Secondary | ICD-10-CM | POA: Insufficient documentation

## 2013-11-17 DIAGNOSIS — Z79899 Other long term (current) drug therapy: Secondary | ICD-10-CM | POA: Diagnosis not present

## 2013-11-17 DIAGNOSIS — F329 Major depressive disorder, single episode, unspecified: Secondary | ICD-10-CM | POA: Diagnosis not present

## 2013-11-17 DIAGNOSIS — Z8719 Personal history of other diseases of the digestive system: Secondary | ICD-10-CM | POA: Insufficient documentation

## 2013-11-17 DIAGNOSIS — Z8679 Personal history of other diseases of the circulatory system: Secondary | ICD-10-CM | POA: Diagnosis not present

## 2013-11-17 DIAGNOSIS — E039 Hypothyroidism, unspecified: Secondary | ICD-10-CM | POA: Diagnosis not present

## 2013-11-17 DIAGNOSIS — Y9389 Activity, other specified: Secondary | ICD-10-CM | POA: Insufficient documentation

## 2013-11-17 DIAGNOSIS — Z8739 Personal history of other diseases of the musculoskeletal system and connective tissue: Secondary | ICD-10-CM | POA: Insufficient documentation

## 2013-11-17 DIAGNOSIS — M1990 Unspecified osteoarthritis, unspecified site: Secondary | ICD-10-CM | POA: Insufficient documentation

## 2013-11-17 DIAGNOSIS — Y929 Unspecified place or not applicable: Secondary | ICD-10-CM | POA: Diagnosis not present

## 2013-11-17 DIAGNOSIS — IMO0002 Reserved for concepts with insufficient information to code with codable children: Secondary | ICD-10-CM | POA: Diagnosis present

## 2013-11-17 DIAGNOSIS — J4489 Other specified chronic obstructive pulmonary disease: Secondary | ICD-10-CM | POA: Insufficient documentation

## 2013-11-17 MED ORDER — ONDANSETRON HCL 4 MG/2ML IJ SOLN
4.0000 mg | Freq: Once | INTRAMUSCULAR | Status: AC
  Administered 2013-11-17: 4 mg via INTRAVENOUS
  Filled 2013-11-17: qty 2

## 2013-11-17 MED ORDER — SODIUM CHLORIDE 0.9 % IV BOLUS (SEPSIS)
1000.0000 mL | Freq: Once | INTRAVENOUS | Status: AC
  Administered 2013-11-17: 1000 mL via INTRAVENOUS

## 2013-11-17 MED ORDER — FENTANYL CITRATE 0.05 MG/ML IJ SOLN
50.0000 ug | Freq: Once | INTRAMUSCULAR | Status: AC
  Administered 2013-11-17: 50 ug via INTRAVENOUS
  Filled 2013-11-17: qty 2

## 2013-11-17 NOTE — ED Notes (Signed)
Patient arrives from [facility] in [place]. [place] getting out of bed, landing on buttocks. Previous h/o L1 spinal fracture from fall last week. Alert/oriented. C/o to back.

## 2013-11-17 NOTE — ED Notes (Signed)
Patient with no complaints at this time. Respirations even and unlabored. Skin warm/dry. Discharge instructions reviewed with patient at this time. Patient given opportunity to voice concerns/ask questions. IV removed per policy and band-aid applied to site. Patient discharged at this time and left Emergency Department via stretcher with ambulance crew. Report given to Levada Dy and Leah at [place].

## 2013-11-17 NOTE — Discharge Instructions (Signed)
Back Pain, Adult Low back pain is very common. About 1 in 5 people have back pain.The cause of low back pain is rarely dangerous. The pain often gets better over time.About half of people with a sudden onset of back pain feel better in just 2 weeks. About 8 in 10 people feel better by 6 weeks.  CAUSES Some common causes of back pain include:  Strain of the muscles or ligaments supporting the spine.  Wear and tear (degeneration) of the spinal discs.  Arthritis.  Direct injury to the back. DIAGNOSIS Most of the time, the direct cause of low back pain is not known.However, back pain can be treated effectively even when the exact cause of the pain is unknown.Answering your caregiver's questions about your overall health and symptoms is one of the most accurate ways to make sure the cause of your pain is not dangerous. If your caregiver needs more information, he or she may order lab work or imaging tests (X-rays or MRIs).However, even if imaging tests show changes in your back, this usually does not require surgery. HOME CARE INSTRUCTIONS For many people, back pain returns.Since low back pain is rarely dangerous, it is often a condition that people can learn to manageon their own.   Remain active. It is stressful on the back to sit or stand in one place. Do not sit, drive, or stand in one place for more than 30 minutes at a time. Take short walks on level surfaces as soon as pain allows.Try to increase the length of time you walk each day.  Do not stay in bed.Resting more than 1 or 2 days can delay your recovery.  Do not avoid exercise or work.Your body is made to move.It is not dangerous to be active, even though your back may hurt.Your back will likely heal faster if you return to being active before your pain is gone.  Pay attention to your body when you bend and lift. Many people have less discomfortwhen lifting if they bend their knees, keep the load close to their bodies,and  avoid twisting. Often, the most comfortable positions are those that put less stress on your recovering back.  Find a comfortable position to sleep. Use a firm mattress and lie on your side with your knees slightly bent. If you lie on your back, put a pillow under your knees.  Only take over-the-counter or prescription medicines as directed by your caregiver. Over-the-counter medicines to reduce pain and inflammation are often the most helpful.Your caregiver may prescribe muscle relaxant drugs.These medicines help dull your pain so you can more quickly return to your normal activities and healthy exercise.  Put ice on the injured area.  Put ice in a plastic bag.  Place a towel between your skin and the bag.  Leave the ice on for 15-20 minutes, 03-04 times a day for the first 2 to 3 days. After that, ice and heat may be alternated to reduce pain and spasms.  Ask your caregiver about trying back exercises and gentle massage. This may be of some benefit.  Avoid feeling anxious or stressed.Stress increases muscle tension and can worsen back pain.It is important to recognize when you are anxious or stressed and learn ways to manage it.Exercise is a great option. SEEK MEDICAL CARE IF:  You have pain that is not relieved with rest or medicine.  You have pain that does not improve in 1 week.  You have new symptoms.  You are generally not feeling well. SEEK   IMMEDIATE MEDICAL CARE IF:   You have pain that radiates from your back into your legs.  You develop new bowel or bladder control problems.  You have unusual weakness or numbness in your arms or legs.  You develop nausea or vomiting.  You develop abdominal pain.  You feel faint. Document Released: 03/05/2005 Document Revised: 09/04/2011 Document Reviewed: 07/07/2013 Exit[employer] Patient Information 2015 [employer]. This information is not intended to replace advice given to you by your health care provider. Make sure you  discuss any questions you have with your health care provider.  

## 2013-11-17 NOTE — ED Provider Notes (Signed)
TIME SEEN: 2:18 PM  CHIEF COMPLAINT: Fall out of bed  HPI: Patient is a 58 year old female with history of fibromyalgia, migraines, chronic neck and back pain, hypertension diabetes, COPD, recent admission for L1 compression fracture who received a kyphoplasty on 8/26 who presents emergency department complaining of back and right hip pain after she had a fall out of bed. She states that she was trying to get out of bed when her feet slipped out from under her because before was very slippery and she landed on her buttocks. She did not hit her head. She states she feels a tightening across her lower back. She states she did have some numbness in her posterior legs is now gone. No weakness. No bowel or bladder incontinence. No fever.  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 She is followed by Dr. Hardin Negus at [facility] pain management.  ROS: See HPI Constitutional: no fever  Eyes: no drainage  ENT: no runny nose   Cardiovascular:  no chest pain  Resp: no SOB  GI: no vomiting GU: no dysuria Integumentary: no rash  Allergy: no hives  Musculoskeletal: no leg swelling  Neurological: no slurred speech ROS otherwise negative  PAST MEDICAL HISTORY/PAST SURGICAL HISTORY:  Past Medical History  Diagnosis Date  . Anxiety   . Depression   . Hypothyroidism   . Osteoarthritis   . IBS (irritable bowel syndrome)     with constipation  . Fibromyalgia   . Chronic LBP   . Migraine headache   . Chronic neck pain   . DM type 2 (diabetes mellitus, type 2)   . Hypertension   . COPD (chronic obstructive pulmonary disease)   . H/O suicide attempt     MEDICATIONS:  Prior to Admission medications   Medication Sig Start Date End Date Taking? Authorizing Provider  albuterol (PROVENTIL) (2.5 MG/3ML) 0.083% nebulizer solution Take 3 mLs (2.5 mg total) by nebulization every 2 (two) hours as needed for wheezing or shortness of breath. 11/13/13  Yes Shanker Kristeen Mans, MD  aspirin EC 81 MG tablet Take 81 mg by  mouth daily.   Yes Historical Provider, MD  budesonide (PULMICORT) 0.25 MG/2ML nebulizer solution Take 2 mLs (0.25 mg total) by nebulization 2 (two) times daily. 11/13/13  Yes Shanker Kristeen Mans, MD  dicyclomine (BENTYL) 10 MG capsule Take 10 mg by mouth 2 (two) times daily.   Yes Historical Provider, MD  fluticasone (FLONASE) 50 MCG/ACT nasal spray Place 2 sprays into both nostrils daily.   Yes Historical Provider, MD  furosemide (LASIX) 20 MG tablet Take 1 tablet (20 mg total) by mouth daily. 11/13/13  Yes Shanker Kristeen Mans, MD  HYDROmorphone (DILAUDID) 2 MG tablet Take 1-2 tablets (2-4 mg total) by mouth every 2 (two) hours as needed for moderate pain. 11/13/13  Yes Shanker Kristeen Mans, MD  ipratropium-albuterol (DUONEB) 0.5-2.5 (3) MG/3ML SOLN Take 3 mLs by nebulization 3 (three) times daily. 11/13/13  Yes Shanker Kristeen Mans, MD  levothyroxine (SYNTHROID, LEVOTHROID) 112 MCG tablet Take 224 mcg by mouth daily before breakfast.   Yes Historical Provider, MD  lidocaine (LIDODERM) 5 % Place 1 patch onto the skin daily.  10/21/12  Yes Historical Provider, MD  LORazepam (ATIVAN) 0.5 MG tablet Take 1 tablet (0.5 mg total) by mouth every 8 (eight) hours as needed for anxiety. 11/13/13  Yes Shanker Kristeen Mans, MD  meclizine (ANTIVERT) 12.5 MG tablet Take 12.5 mg by mouth 3 (three) times daily as needed for dizziness.   Yes Historical Provider, MD  methadone (DOLOPHINE) 10 MG tablet Take 1.5 tablets (15 mg total) by mouth 3 (three) times daily. 11/16/13  Yes Shanker Kristeen Mans, MD  ondansetron (ZOFRAN) 4 MG tablet Take 1 tablet (4 mg total) by mouth every 8 (eight) hours as needed for nausea. 11/13/13  Yes Shanker Kristeen Mans, MD  oxybutynin (DITROPAN-XL) 5 MG 24 hr tablet Take 5 mg by mouth daily.   Yes Historical Provider, MD  polyethylene glycol (MIRALAX / GLYCOLAX) packet Take 17 g by mouth 2 (two) times daily. 11/13/13  Yes Shanker Kristeen Mans, MD  potassium chloride SA (K-DUR,KLOR-CON) 20 MEQ tablet Take 1 tablet (20  mEq total) by mouth daily. 11/13/13  Yes Shanker Kristeen Mans, MD  senna (SENOKOT) 8.6 MG TABS tablet Take 2 tablets (17.2 mg total) by mouth at bedtime. 11/13/13  Yes Shanker Kristeen Mans, MD  sertraline (ZOLOFT) 100 MG tablet Take 200 mg by mouth every morning.   Yes Historical Provider, MD  sertraline (ZOLOFT) 100 MG tablet Take 100 mg by mouth at bedtime.   Yes Historical Provider, MD  solifenacin (VESICARE) 5 MG tablet Take 5 mg by mouth daily.   Yes Historical Provider, MD  traZODone (DESYREL) 100 MG tablet Take 100-200 mg by mouth at bedtime as needed for sleep.   Yes Historical Provider, MD    ALLERGIES:  Allergies  Allergen Reactions  . Prednisone Shortness Of Breath and Nausea And Vomiting  . Penicillins Other (See Comments)    Pt states she almost died after taking the following. She states that she had Stroke like symptoms post taking the medication  . Cephalexin Hives, Swelling and Rash  . Latex Rash and Other (See Comments)    Causes skin to tear easily  . Metoclopramide Hcl Hives and Rash    SOCIAL HISTORY:  History  Substance Use Topics  . Smoking status: Former Smoker -- 0.30 packs/day for 40 years    Types: Cigarettes  . Smokeless tobacco: Not on file     Comment: quit 2 months ago   . Alcohol Use: No    FAMILY HISTORY: Family History  Problem Relation Age of Onset  . Cancer Father     pancreatic   . Pancreatic cancer Father   . Heart failure Mother   . GI problems Mother   . Heart disease Mother   . Diabetes Mother   . Thyroid disease Sister   . Cancer Maternal Grandfather   . Cancer      family history     EXAM: BP 116/103  Pulse 72  Temp(Src) 98.7 F (37.1 C) (Oral)  Resp 22  Ht 5\' 2"  (1.575 m)  Wt 200 lb (90.719 kg)  BMI 36.57 kg/m2  SpO2 96% CONSTITUTIONAL: Alert and oriented and responds appropriately to questions. Well-appearing; well-nourished; GCS 15 HEAD: Normocephalic; atraumatic EYES: Conjunctivae clear, PERRL, EOMI ENT: normal nose; no  rhinorrhea; moist mucous membranes; pharynx without lesions noted; no dental injury; no hemotypanum; no septal hematoma NECK: Supple, no meningismus, no LAD; no midline spinal tenderness, step-off or deformity CARD: RRR; S1 and S2 appreciated; no murmurs, no clicks, no rubs, no gallops RESP: Normal chest excursion without splinting or tachypnea; breath sounds clear and equal bilaterally; no wheezes, no rhonchi, no rales; chest wall stable, nontender to palpation ABD/GI: Normal bowel sounds; non-distended; soft, non-tender, no rebound, no guarding PELVIS:  stable, mildly tender to palpation over the posterior left iliac crest and hip without deformity, no leg length discrepancy BACK:  The back appears normal and  there is no midline spinal tenderness or step-off or deformity, she has a well-healed surgical scar over her lumbar spine, no erythema or warmth, no induration or fluctuance, small healing incision from recent kyphoplasty lateral to the L1 spinous process is clean and dry and intact, patient has muscle spasms in her right lumbar paraspinal musculature EXT: Normal ROM in all joints; non-tender to palpation; no edema; normal capillary refill; no cyanosis, 2+ DP and radial pulses bilaterally, patient's right wrist is in a Velcro splint, right ankle is in a cam walker    SKIN: Normal color for age and race; warm NEURO: Moves all extremities equally sensation to light touch intact diffusely, cranial nerves II through XII intact PSYCH: The patient's mood and manner are appropriate. Grooming and personal hygiene are appropriate.  MEDICAL DECISION MAKING: Patient here with back pain and right hip pain after a fall out of bed onto her buttocks. Very low suspicion for any new bony injury. I suspect this is all muscle spasm and exacerbation of her chronic pain. Her blood pressure is slightly low. We'll give IV fluids and fentanyl. Have explained to her that I cannot give her any other pain medication for her  blood pressure has improved. She is on a pain contract and is currently in a nursing facility who is monitoring her pain and giving her her medications. We'll obtain x-rays of her lumbar spine and right hip. Anticipate discharge home once pain is improved.  ED PROGRESS: X-rays are completely unremarkable. She reports her pain is improved with Bentyl. I feel she is safe to be discharged back to her rehabilitation facility. She agrees with this plan. Discussed strict return precautions and supportive care instructions. We'll not discharge him with any narcotic medications and she is on a pain contract.     [place], DO 11/17/13 1520

## 2013-11-19 ENCOUNTER — Telehealth: Payer: Self-pay | Admitting: *Deleted

## 2013-11-19 NOTE — Telephone Encounter (Signed)
Called and left message on machine that dr at facility must treat

## 2013-11-19 NOTE — Telephone Encounter (Signed)
Pt called requesting something for her cough per pt she has a infection and coughing up "thick stuff" pt would like something for it. [phone]

## 2013-12-14 ENCOUNTER — Telehealth: Payer: Self-pay | Admitting: Family Medicine

## 2013-12-14 NOTE — Telephone Encounter (Signed)
Pls contact case mgr on file requesting updated info ion this pt, explain she is in currently in a facility , (verify with spiouse still there before you call) If she is then direct case mge to that facility for info requested , paperwork is in your area fior reference Arna Medici, [place])  Let her know pt has f/u sched here 01/25/2014  Questions, PLS ASK \\Thanks 

## 2013-12-15 ENCOUNTER — Telehealth: Payer: Self-pay | Admitting: *Deleted

## 2013-12-15 NOTE — Telephone Encounter (Signed)
Pt returned call, Pt states she is home now and she is not at the nursing home anymore, per pt she had a appt yesterday about her ankle, pt states she will have to have surgery 12/23/13 to have her ankle repaired pt states she broke the plates in half and the DR. There will be running a rod up through the hill. Pt wanted Dr. Moshe Cipro to be aware and right not she is on the wheelchair and a walker, pt is also wearing a cam boot. Pt has questions about her blood work. Please advise [phone]

## 2013-12-16 NOTE — Telephone Encounter (Signed)
Patient is at home. Form put back in Dr's box

## 2013-12-16 NOTE — Telephone Encounter (Signed)
Per pt- no questions at this time.

## 2013-12-17 ENCOUNTER — Telehealth: Payer: Self-pay | Admitting: *Deleted

## 2013-12-17 NOTE — Telephone Encounter (Signed)
Pt called stating Dr. Moshe Cipro needs to send a paper to her pharmacy stating she needs her nausea medication, pt states her insurance will not pay on it. Please advise [phone]

## 2013-12-18 ENCOUNTER — Other Ambulatory Visit: Payer: Self-pay | Admitting: Family Medicine

## 2013-12-18 MED ORDER — PROMETHAZINE HCL 12.5 MG PO TABS: ORAL_TABLET | ORAL | Status: DC

## 2013-12-18 NOTE — Telephone Encounter (Signed)
I Suggest trying to pA the zofran, phenergan is sedation and she has already fallen estaBLISH FROM PT WHY SHE ACTUALLY NEEDS NAUSEA MED EVERY DAY TO INCLUDE IN THE pa

## 2013-12-18 NOTE — Telephone Encounter (Signed)
The zofran was not prescribed by Dr Moshe Cipro. Is there something else that can be called in that is cheaper for patient?

## 2013-12-18 NOTE — Telephone Encounter (Signed)
States she has spells of nausea and vointing that come out of nowhere due to her sensitive stomach and some of her meds make her nauseated also. wasiting on PA to come

## 2013-12-22 NOTE — Telephone Encounter (Signed)
Med approved, approval sent to pharmacy with note to notify her when med is available to be picked up

## 2013-12-24 ENCOUNTER — Telehealth: Payer: Self-pay | Admitting: Family Medicine

## 2013-12-24 NOTE — Telephone Encounter (Signed)
Dr Moshe Cipro did not prescribe Lidocaine patches. Will await call from patient about this. Her zofran was approved Wednesday

## 2014-01-01 ENCOUNTER — Other Ambulatory Visit: Payer: Self-pay

## 2014-01-05 ENCOUNTER — Other Ambulatory Visit: Payer: Self-pay

## 2014-01-05 ENCOUNTER — Telehealth: Payer: Self-pay

## 2014-01-05 MED ORDER — IPRATROPIUM-ALBUTEROL 0.5-2.5 (3) MG/3ML IN SOLN: 3.0000 mL | Freq: Three times a day (TID) | RESPIRATORY_TRACT | Status: DC

## 2014-01-05 NOTE — Telephone Encounter (Signed)
pls send in duoneb solution as already listed on med list written on 10/2013 , 1 month, with 2 refills, let pt know

## 2014-01-05 NOTE — Telephone Encounter (Signed)
Patient aware that she should check with pharmacy.

## 2014-01-25 ENCOUNTER — Ambulatory Visit: Payer: Medicare FFS | Admitting: Family Medicine

## 2014-02-26 ENCOUNTER — Other Ambulatory Visit: Payer: Self-pay | Admitting: Obstetrics & Gynecology

## 2014-03-25 ENCOUNTER — Ambulatory Visit: Payer: Medicare FFS | Admitting: Family Medicine

## 2014-03-29 ENCOUNTER — Telehealth: Payer: Self-pay

## 2014-03-29 ENCOUNTER — Other Ambulatory Visit: Payer: Self-pay

## 2014-03-29 MED ORDER — BENZONATATE 100 MG PO CAPS: 100.0000 mg | ORAL_CAPSULE | Freq: Two times a day (BID) | ORAL | Status: DC | PRN

## 2014-03-29 MED ORDER — DOXYCYCLINE HYCLATE 100 MG PO TABS: 100.0000 mg | ORAL_TABLET | Freq: Two times a day (BID) | ORAL | Status: DC

## 2014-03-29 NOTE — Telephone Encounter (Signed)
Wanted to be seen today- advised no appts avail but I would send a message. Nasal congestion with green mucus. Terrible pain in her cheekbones x 1 week and also started coughing up some yellow phlegm recently. No fever, Please advise

## 2014-03-29 NOTE — Telephone Encounter (Signed)
Patient aware and meds sent 

## 2014-03-29 NOTE — Telephone Encounter (Signed)
pls send in doxycycline 100mg  twice daily # 20 , no refills AND she nEEDS to make and keep appt, did not come in l;ast week  OTC decongestant or tessalon perles 100mg  twice daily #14 only

## 2014-03-30 ENCOUNTER — Telehealth: Payer: Self-pay | Admitting: *Deleted

## 2014-03-30 MED ORDER — ONDANSETRON HCL 4 MG PO TABS: 4.0000 mg | ORAL_TABLET | Freq: Every day | ORAL | Status: DC | PRN

## 2014-03-30 NOTE — Telephone Encounter (Signed)
Female called for pt requesting something for nausea stated that pt has been throwing up. Please advise

## 2014-03-30 NOTE — Telephone Encounter (Signed)
Vomiting Yes.    States its off and on since the weekend and she can't really eat anything without getting sick and vomiting. I advised her that I would call in some meds for her but if she wasn't any better in a day or 2 or she started getting weak or urine output decreased even when drinking liquids to go to the ER for evaluation   Recommended treatment Hydration is important Fluids small frequent amounts as tolerated Good hygiene reduces transmission among family members Review Brat diet  Zofran 4 mg 1 tablet daily as needed for nausea and vomiting no more than 6 tablets   DiarrheaNo.  Recommended treatment  Imodium OTC  Can also offer Lomotil 1 tablet 4 times daily as needed no more than 10 tablets Good hygiene reduces transmission among family members Review Brat Diet  If patient starts to feel light headed or not passing much urine or becoming dehydrated will need to go to emergency room for IV hydration  Please call office if symptoms worsen or do not improve after 2-3 days

## 2014-04-20 ENCOUNTER — Encounter: Payer: Self-pay | Admitting: Family Medicine

## 2014-04-20 ENCOUNTER — Ambulatory Visit (INDEPENDENT_AMBULATORY_CARE_PROVIDER_SITE_OTHER): Payer: Medicare FFS | Admitting: Family Medicine

## 2014-04-20 ENCOUNTER — Encounter (INDEPENDENT_AMBULATORY_CARE_PROVIDER_SITE_OTHER): Payer: Self-pay | Admitting: *Deleted

## 2014-04-20 VITALS — BP 98/62 | HR 74 | Resp 18 | Ht 62.0 in | Wt 203.1 lb

## 2014-04-20 DIAGNOSIS — R072 Precordial pain: Secondary | ICD-10-CM

## 2014-04-20 DIAGNOSIS — R110 Nausea: Secondary | ICD-10-CM

## 2014-04-20 DIAGNOSIS — E669 Obesity, unspecified: Secondary | ICD-10-CM

## 2014-04-20 DIAGNOSIS — Z79899 Other long term (current) drug therapy: Secondary | ICD-10-CM

## 2014-04-20 DIAGNOSIS — J449 Chronic obstructive pulmonary disease, unspecified: Secondary | ICD-10-CM

## 2014-04-20 DIAGNOSIS — R0789 Other chest pain: Secondary | ICD-10-CM

## 2014-04-20 DIAGNOSIS — E038 Other specified hypothyroidism: Secondary | ICD-10-CM

## 2014-04-20 DIAGNOSIS — R42 Dizziness and giddiness: Secondary | ICD-10-CM

## 2014-04-20 DIAGNOSIS — I341 Nonrheumatic mitral (valve) prolapse: Secondary | ICD-10-CM

## 2014-04-20 DIAGNOSIS — N951 Menopausal and female climacteric states: Secondary | ICD-10-CM

## 2014-04-20 DIAGNOSIS — Z1211 Encounter for screening for malignant neoplasm of colon: Secondary | ICD-10-CM

## 2014-04-20 DIAGNOSIS — M5440 Lumbago with sciatica, unspecified side: Secondary | ICD-10-CM

## 2014-04-20 DIAGNOSIS — F332 Major depressive disorder, recurrent severe without psychotic features: Secondary | ICD-10-CM

## 2014-04-20 DIAGNOSIS — Z1231 Encounter for screening mammogram for malignant neoplasm of breast: Secondary | ICD-10-CM

## 2014-04-20 MED ORDER — PROMETHAZINE HCL 25 MG PO TABS: 25.0000 mg | ORAL_TABLET | Freq: Every day | ORAL | Status: DC | PRN

## 2014-04-20 NOTE — Patient Instructions (Signed)
Annual wellness in 4.5 month, call if you need me before  You are referred for mammogram at [place] are referred to cardiology at [facility] in March as discussed  You are referred to Dr Laural Golden for colonoscopy end March as discussed  Consider meloxicam 7.88m daily for generalize joint pain,  In place of aleve , call if you decide to try this  Call if you wish to see dermatology before f/u  Re moles etc, PLS stay away from tanning bed  CBC, fasting lipid, cmp and EGFR and HBA1C and vit D in February or March please  Zofran is changed to phenergan per your request , and antivert is refilled

## 2014-04-20 NOTE — Progress Notes (Signed)
Subjective:    Patient ID: [name] Fitzpatrick, female    DOB: [birth year], 59 y.o.   MRN: [id number]  HPI The PT is here for follow up and re-evaluation of chronic medical conditions, medication management and review of any available recent lab and radiology data.  Preventive health is updated, specifically  Cancer screening and Immunization.  Needs colonoscopy. Since last seen she sustained a fracture of the right ankle, had extensive surgery and needed to be in a SNF prior to her surgery as she was unable to weight bear. States that in the past week, she felt her heart "flop" in her chest, and reports intermittent chest pain The PT denies any adverse reactions to current medications since the last visit.  She is followed by endo at [facility], and ortho at [facility] is in [place], al;so has chronic pain management C/o multiple moles and lipomas, some of which are painful esp in back, will hold on derm eval at this time by choice       Review of Systems See HPI Denies recent fever or chills. Denies sinus pressure, nasal congestion, ear pain or sore throat. Denies chest congestion, productive cough or wheezing. C/o intermittent chest discomfort, possible palpitations, and leg swelling Denies abdominal pain, nausea, vomiting,diarrhea or constipation.   Denies dysuria, frequency, hesitancy has  incontinence. Chronic joint pain, swelling and limitation in mobility.Sees pain specialist Denies headaches, seizures, numbness, or tingling. Denies  Uncontrolled depression, anxiety or insomnia.       Objective:   Physical Exam BP 98/62 mmHg  Pulse 74  Resp 18  Ht 5\' 2"  (1.575 m)  Wt 203 lb 1.9 oz (92.135 kg)  BMI 37.14 kg/m2  SpO2 95%  Patient alert and oriented and in no cardiopulmonary distress.  HEENT: No facial asymmetry, EOMI,   oropharynx pink and moist.  Neck supple no JVD, no mass.  Chest: Clear to auscultation bilaterally.  CVS: S1, S2 systolic grade 2/6   murmur, no S3.Regular rate.  ABD: Soft non tender.   Ext: No edema  MS: Adequate though reduced  ROM spine, shoulders, hips and knees.  Skin: Intact, multiple pigmented nevi, none look alarming, cherry hemangiomas and lipomas  Psych: Good eye contact, normal affect. Memory intact , mildly anxious not  depressed appearing.  CNS: CN 2-12 intact, power,  normal throughout.no focal deficits noted.        Assessment & Plan:  MVP (mitral valve prolapse) Recent episode of chest discomfort as though heart "flopped in her chest" refer to cardiology for furhter evaluation, also c/o leg swelling   Nausea without vomiting Chronic nausea, likely aggravated by chronic pain medication, continue daily phenergan   COPD (chronic obstructive pulmonary disease) Improved, since she has stopped smoking, not on oxygen at visit with normal sats   Hypothyroidism Followed by endocrine at [facility]   Vaginal dryness, menopausal Has topical estrogen through gyne   BACK PAIN, LUMBAR Chronic painmanagement unchanged through pain clinic   DEPRESSION Well controlled and stable treated by psych   Nausea Unchanged, requests phenergan over zofran states more effective   Obesity (BMI 30-39.9) Unchanged. Patient re-educated about  the importance of commitment to a  minimum of 150 minutes of exercise per week. The importance of healthy food choices with portion control discussed. Encouraged to start a food diary, count calories and to consider  joining a support group. Sample diet sheets offered. Goals set by the patient for the next several months.      Vertigo  Intermittent episodes , needs ot have antivert available for as needed use   Major depressive disorder, recurrent episode, severe Marked imporovement in depression and anxiety, treated by psychiatry   Chest pain Increased episodes of chest discomfort in past 2 to 3 week, accompanied by light headness on one occasion,  felt as though hjer hear "flipped" tis wa in he past 7 to 10 days Needs cardiology eval

## 2014-04-21 ENCOUNTER — Telehealth: Payer: Self-pay | Admitting: Family Medicine

## 2014-04-21 MED ORDER — MECLIZINE HCL 12.5 MG PO TABS: 12.5000 mg | ORAL_TABLET | Freq: Three times a day (TID) | ORAL | Status: DC | PRN

## 2014-04-21 NOTE — Telephone Encounter (Signed)
Will enter

## 2014-04-21 NOTE — Telephone Encounter (Signed)
I see where she was supposed to be referred to cardiology but I don't see where it was done. I tried to do it but I didn't see a reason in the discharge instructions. Please advise

## 2014-04-22 ENCOUNTER — Encounter: Payer: Self-pay | Admitting: Family Medicine

## 2014-04-22 NOTE — Assessment & Plan Note (Signed)
Well controlled and stable treated by psych

## 2014-04-22 NOTE — Assessment & Plan Note (Signed)
Improved, since she has stopped smoking, not on oxygen at visit with normal sats

## 2014-04-22 NOTE — Assessment & Plan Note (Signed)
Recent episode of chest discomfort as though heart "flopped in her chest" refer to cardiology for furhter evaluation, also c/o leg swelling

## 2014-04-22 NOTE — Assessment & Plan Note (Signed)
Has topical estrogen through gyne

## 2014-04-22 NOTE — Assessment & Plan Note (Signed)
Chronic painmanagement unchanged through pain clinic

## 2014-04-22 NOTE — Assessment & Plan Note (Signed)
Chronic nausea, likely aggravated by chronic pain medication, continue daily phenergan

## 2014-04-22 NOTE — Assessment & Plan Note (Signed)
Followed by endocrine at [facility]

## 2014-04-23 NOTE — Telephone Encounter (Signed)
referral has been entered

## 2014-04-25 NOTE — Assessment & Plan Note (Signed)
Marked imporovement in depression and anxiety, treated by psychiatry

## 2014-04-25 NOTE — Assessment & Plan Note (Signed)
Unchanged. Patient re-educated about  the importance of commitment to a  minimum of 150 minutes of exercise per week. The importance of healthy food choices with portion control discussed. Encouraged to start a food diary, count calories and to consider  joining a support group. Sample diet sheets offered. Goals set by the patient for the next several months.    

## 2014-04-25 NOTE — Assessment & Plan Note (Signed)
Intermittent episodes , needs ot have antivert available for as needed use

## 2014-04-25 NOTE — Assessment & Plan Note (Signed)
Unchanged, requests phenergan over zofran states more effective

## 2014-04-25 NOTE — Assessment & Plan Note (Signed)
Increased episodes of chest discomfort in past 2 to 3 week, accompanied by light headness on one occasion, felt as though hjer hear "flipped" tis wa in he past 7 to 10 days Needs cardiology eval

## 2014-04-29 ENCOUNTER — Telehealth: Payer: Self-pay | Admitting: Family Medicine

## 2014-04-30 NOTE — Telephone Encounter (Signed)
Patient advised to go to ED because she was concerned with possible blood clot

## 2014-05-07 ENCOUNTER — Other Ambulatory Visit: Payer: Self-pay | Admitting: Family Medicine

## 2014-06-14 ENCOUNTER — Other Ambulatory Visit: Payer: Self-pay | Admitting: Family Medicine

## 2014-06-14 ENCOUNTER — Ambulatory Visit (HOSPITAL_COMMUNITY): Admission: RE | Admit: 2014-06-14 | Payer: Medicare HMO | Source: Ambulatory Visit

## 2014-06-14 DIAGNOSIS — R229 Localized swelling, mass and lump, unspecified: Principal | ICD-10-CM

## 2014-06-14 DIAGNOSIS — IMO0002 Reserved for concepts with insufficient information to code with codable children: Secondary | ICD-10-CM

## 2014-06-16 ENCOUNTER — Ambulatory Visit (HOSPITAL_COMMUNITY)
Admission: RE | Admit: 2014-06-16 | Discharge: 2014-06-16 | Disposition: A | Discharge status: Home / Self Care | Payer: Medicare HMO | Source: Ambulatory Visit | Attending: Family Medicine | Admitting: Family Medicine

## 2014-06-16 ENCOUNTER — Other Ambulatory Visit: Payer: Self-pay | Admitting: Family Medicine

## 2014-06-16 DIAGNOSIS — N63 Unspecified lump in breast: Secondary | ICD-10-CM | POA: Insufficient documentation

## 2014-06-16 DIAGNOSIS — N6320 Unspecified lump in the left breast, unspecified quadrant: Secondary | ICD-10-CM

## 2014-06-16 DIAGNOSIS — IMO0002 Reserved for concepts with insufficient information to code with codable children: Secondary | ICD-10-CM

## 2014-06-16 DIAGNOSIS — R229 Localized swelling, mass and lump, unspecified: Secondary | ICD-10-CM

## 2014-07-16 ENCOUNTER — Telehealth: Payer: Self-pay | Admitting: Family Medicine

## 2014-07-16 NOTE — Telephone Encounter (Signed)
Ok to refer to dermatology at [facility]?

## 2014-07-19 NOTE — Telephone Encounter (Signed)
pls r, find out from ger which breast HOW LONG SHE HAS HAD IT , COLOR OF LESION , APPRIOX SIZE, I WILL ENTER REFERRAL WITH THIS INFO

## 2014-07-20 ENCOUNTER — Other Ambulatory Visit: Payer: Self-pay | Admitting: Family Medicine

## 2014-07-20 DIAGNOSIS — D171 Benign lipomatous neoplasm of skin and subcutaneous tissue of trunk: Secondary | ICD-10-CM

## 2014-07-20 NOTE — Telephone Encounter (Signed)
Called patient and left message for them to return call at the office   

## 2014-07-20 NOTE — Telephone Encounter (Signed)
Left breast fatty lobules. [name] Fitzpatrick it was seen in her last mammogram and it is now slightly larger in size and she wants to go to [facility] to have it removed

## 2014-07-20 NOTE — Telephone Encounter (Signed)
Referral entered will alert referral staff

## 2014-07-21 ENCOUNTER — Encounter: Payer: Self-pay | Admitting: Family Medicine

## 2014-08-02 ENCOUNTER — Encounter: Payer: Self-pay | Admitting: Family Medicine

## 2014-09-03 ENCOUNTER — Other Ambulatory Visit: Payer: Self-pay | Admitting: Family Medicine

## 2014-09-16 ENCOUNTER — Encounter: Payer: Medicare HMO | Admitting: Family Medicine

## 2014-10-19 ENCOUNTER — Telehealth: Payer: Self-pay

## 2014-10-19 DIAGNOSIS — R7302 Impaired glucose tolerance (oral): Secondary | ICD-10-CM

## 2014-10-19 DIAGNOSIS — E038 Other specified hypothyroidism: Secondary | ICD-10-CM

## 2014-10-19 DIAGNOSIS — E559 Vitamin D deficiency, unspecified: Secondary | ICD-10-CM

## 2014-10-19 DIAGNOSIS — Z13220 Encounter for screening for lipoid disorders: Secondary | ICD-10-CM

## 2014-10-19 NOTE — Addendum Note (Signed)
Addended by: Denman George B on: 10/19/2014 04:12 PM   Modules accepted: Orders

## 2014-10-19 NOTE — Telephone Encounter (Signed)
Labs before next visit

## 2014-10-21 ENCOUNTER — Ambulatory Visit: Payer: Self-pay | Admitting: Family Medicine

## 2014-10-25 ENCOUNTER — Encounter: Payer: Self-pay | Admitting: Family Medicine

## 2014-10-25 ENCOUNTER — Ambulatory Visit (INDEPENDENT_AMBULATORY_CARE_PROVIDER_SITE_OTHER): Payer: Medicare HMO | Admitting: Family Medicine

## 2014-10-25 VITALS — BP 100/60 | HR 79 | Resp 16 | Ht 62.0 in | Wt 192.0 lb

## 2014-10-25 DIAGNOSIS — R7302 Impaired glucose tolerance (oral): Secondary | ICD-10-CM | POA: Diagnosis not present

## 2014-10-25 DIAGNOSIS — Z1159 Encounter for screening for other viral diseases: Secondary | ICD-10-CM

## 2014-10-25 DIAGNOSIS — E038 Other specified hypothyroidism: Secondary | ICD-10-CM | POA: Diagnosis not present

## 2014-10-25 DIAGNOSIS — F329 Major depressive disorder, single episode, unspecified: Secondary | ICD-10-CM | POA: Diagnosis not present

## 2014-10-25 DIAGNOSIS — J449 Chronic obstructive pulmonary disease, unspecified: Secondary | ICD-10-CM

## 2014-10-25 DIAGNOSIS — G4489 Other headache syndrome: Secondary | ICD-10-CM

## 2014-10-25 DIAGNOSIS — K5909 Other constipation: Secondary | ICD-10-CM

## 2014-10-25 DIAGNOSIS — E669 Obesity, unspecified: Secondary | ICD-10-CM

## 2014-10-25 DIAGNOSIS — F32A Depression, unspecified: Secondary | ICD-10-CM

## 2014-10-25 DIAGNOSIS — M5440 Lumbago with sciatica, unspecified side: Secondary | ICD-10-CM

## 2014-10-25 DIAGNOSIS — R32 Unspecified urinary incontinence: Secondary | ICD-10-CM

## 2014-10-25 MED ORDER — POLYETHYLENE GLYCOL 3350 17 G PO PACK: 17.0000 g | PACK | Freq: Two times a day (BID) | ORAL | Status: DC

## 2014-10-25 MED ORDER — SOLIFENACIN SUCCINATE 5 MG PO TABS: 5.0000 mg | ORAL_TABLET | Freq: Every day | ORAL | Status: DC

## 2014-10-25 MED ORDER — FLUTICASONE PROPIONATE 50 MCG/ACT NA SUSP: 2.0000 | Freq: Every day | NASAL | Status: DC

## 2014-10-25 NOTE — Patient Instructions (Addendum)
Annual wellness in January , call if you need me before please  Call for flu vaccine in  September  Please join the [facility] so that you start regular exercise , start slowly , this will improve your health  Resume your vesicare, may reduce need to go at night, also miralax is being refilled  You will be contacted about your thyroid and other lab work  I will refer you to dermatology and to Dr Laural Golden   Thanks for choosing [facility], we consider it a privelige to serve you.

## 2014-10-26 ENCOUNTER — Encounter: Payer: Self-pay | Admitting: Family Medicine

## 2014-10-26 LAB — COMPREHENSIVE METABOLIC PANEL
ALT: 10 U/L (ref 6–29)
AST: 14 U/L (ref 10–35)
Albumin: 4.1 g/dL (ref 3.6–5.1)
Alkaline Phosphatase: 74 U/L (ref 33–130)
BILIRUBIN TOTAL: 0.5 mg/dL (ref 0.2–1.2)
BUN: 16 mg/dL (ref 7–25)
CALCIUM: 9.8 mg/dL (ref 8.6–10.4)
CO2: 27 mmol/L (ref 20–31)
CREATININE: 0.77 mg/dL (ref 0.50–1.05)
Chloride: 101 mmol/L (ref 98–110)
Glucose, Bld: 93 mg/dL (ref 65–99)
POTASSIUM: 4.3 mmol/L (ref 3.5–5.3)
SODIUM: 142 mmol/L (ref 135–146)
TOTAL PROTEIN: 7.1 g/dL (ref 6.1–8.1)

## 2014-10-26 LAB — HEMOGLOBIN A1C
Hgb A1c MFr Bld: 5.6 % (ref <5.7)
Mean Plasma Glucose: 114 mg/dL (ref <117)

## 2014-10-26 LAB — LIPID PANEL
CHOLESTEROL: 228 mg/dL — AB (ref 125–200)
HDL: 63 mg/dL (ref >=46)
LDL CALC: 126 mg/dL (ref <130)
TRIGLYCERIDES: 195 mg/dL — AB (ref <150)
Total CHOL/HDL Ratio: 3.6 Ratio (ref <=5.0)
VLDL: 39 mg/dL — ABNORMAL HIGH (ref <30)

## 2014-10-26 LAB — HIV ANTIBODY (ROUTINE TESTING W REFLEX): HIV 1&2 Ab, 4th Generation: NONREACTIVE

## 2014-10-26 LAB — CBC
HCT: 37.5 % (ref 36.0–46.0)
HEMOGLOBIN: 12.8 g/dL (ref 12.0–15.0)
MCH: 28.8 pg (ref 26.0–34.0)
MCHC: 34.1 g/dL (ref 30.0–36.0)
MCV: 84.5 fL (ref 78.0–100.0)
MPV: 9.6 fL (ref 8.6–12.4)
PLATELETS: 237 10*3/uL (ref 150–400)
RBC: 4.44 MIL/uL (ref 3.87–5.11)
RDW: 14 % (ref 11.5–15.5)
WBC: 7.4 10*3/uL (ref 4.0–10.5)

## 2014-10-26 LAB — T4, FREE: FREE T4: 1.18 ng/dL (ref 0.80–1.80)

## 2014-10-26 LAB — TSH: TSH: 1.617 u[IU]/mL (ref 0.350–4.500)

## 2014-10-26 LAB — T3, FREE: T3 FREE: 2.3 pg/mL (ref 2.3–4.2)

## 2014-10-26 LAB — VITAMIN D 25 HYDROXY (VIT D DEFICIENCY, FRACTURES): Vit D, 25-Hydroxy: 29 ng/mL — ABNORMAL LOW (ref 30–100)

## 2014-10-26 LAB — HEPATITIS C ANTIBODY: HCV Ab: NEGATIVE

## 2014-11-01 ENCOUNTER — Telehealth: Payer: Self-pay

## 2014-11-01 ENCOUNTER — Other Ambulatory Visit: Payer: Self-pay

## 2014-11-01 DIAGNOSIS — R609 Edema, unspecified: Secondary | ICD-10-CM

## 2014-11-01 MED ORDER — FUROSEMIDE 20 MG PO TABS: 20.0000 mg | ORAL_TABLET | Freq: Every day | ORAL | Status: DC

## 2014-11-01 MED ORDER — POTASSIUM CHLORIDE CRYS ER 20 MEQ PO TBCR: 20.0000 meq | EXTENDED_RELEASE_TABLET | Freq: Every day | ORAL | Status: DC

## 2014-11-01 NOTE — Telephone Encounter (Signed)
States the heat has her swollen up again all in her legs and feet and she needs her lasix sent to the pharmacy. Its not on her medlist and she states its been awhile since she had it. Ok to send in 20mg  bid or new directions?

## 2014-11-01 NOTE — Telephone Encounter (Signed)
Yes pls send 20 mg  Once daily as needed #30 refill ero and potassium 20 meq once daily on day that lasix is taken #30 refill zero

## 2014-11-01 NOTE — Telephone Encounter (Signed)
Pt aware, med sent  

## 2014-11-11 ENCOUNTER — Ambulatory Visit (INDEPENDENT_AMBULATORY_CARE_PROVIDER_SITE_OTHER): Payer: Medicare HMO | Admitting: Family Medicine

## 2014-11-11 VITALS — BP 110/68 | HR 70 | Resp 14 | Ht 62.0 in | Wt 192.0 lb

## 2014-11-11 DIAGNOSIS — L049 Acute lymphadenitis, unspecified: Secondary | ICD-10-CM | POA: Diagnosis not present

## 2014-11-11 DIAGNOSIS — M545 Low back pain, unspecified: Secondary | ICD-10-CM | POA: Insufficient documentation

## 2014-11-11 DIAGNOSIS — D179 Benign lipomatous neoplasm, unspecified: Secondary | ICD-10-CM | POA: Diagnosis not present

## 2014-11-11 DIAGNOSIS — M542 Cervicalgia: Secondary | ICD-10-CM | POA: Diagnosis not present

## 2014-11-11 DIAGNOSIS — M5441 Lumbago with sciatica, right side: Secondary | ICD-10-CM

## 2014-11-11 DIAGNOSIS — L989 Disorder of the skin and subcutaneous tissue, unspecified: Secondary | ICD-10-CM

## 2014-11-11 DIAGNOSIS — G4489 Other headache syndrome: Secondary | ICD-10-CM

## 2014-11-11 DIAGNOSIS — J449 Chronic obstructive pulmonary disease, unspecified: Secondary | ICD-10-CM

## 2014-11-11 DIAGNOSIS — E038 Other specified hypothyroidism: Secondary | ICD-10-CM

## 2014-11-11 MED ORDER — IBUPROFEN 600 MG PO TABS: ORAL_TABLET | ORAL | Status: DC

## 2014-11-11 MED ORDER — CLINDAMYCIN HCL 300 MG PO CAPS: 300.0000 mg | ORAL_CAPSULE | Freq: Three times a day (TID) | ORAL | Status: DC

## 2014-11-11 NOTE — Patient Instructions (Addendum)
F/u as before  You are treated for acuter inflammation/ infection of lymph nodes under rigth jaw, call back next week if not resolved  You have been referred to orthopedics with [facility] re neck and back pain per your request  Please call Dr Olevia Perches office to schedule your colonoscopy, letter from his office is handed to you  Flu vaccine available from Monday to Thursday starting in September.  Please come and get your vaccine to protect yourself  and your family.  Thanks for choosing [facility], we consider it a privelige to serve you.

## 2014-11-11 NOTE — Assessment & Plan Note (Signed)
1 week h/o increased neck pain with cracking ,  Was in [place] in [address], sustained neck fracture which was manged conservatively as she has had 2 fusions of c spine in the past. Now daily headaches for 1 week also

## 2014-11-13 DIAGNOSIS — R32 Unspecified urinary incontinence: Secondary | ICD-10-CM | POA: Insufficient documentation

## 2014-11-13 DIAGNOSIS — F32A Depression, unspecified: Secondary | ICD-10-CM | POA: Insufficient documentation

## 2014-11-13 DIAGNOSIS — K5900 Constipation, unspecified: Secondary | ICD-10-CM | POA: Insufficient documentation

## 2014-11-13 DIAGNOSIS — F329 Major depressive disorder, single episode, unspecified: Secondary | ICD-10-CM | POA: Insufficient documentation

## 2014-11-13 NOTE — Progress Notes (Signed)
Subjective:    Patient ID: [name] Fitzpatrick, female    DOB: 1955/11/30, 59 y.o.   MRN: [id number]  HPI  The PT is here for follow up and re-evaluation of chronic medical conditions, medication management and review of any available recent lab and radiology data.  Preventive health is updated, specifically  Cancer screening and Immunization.   Questions or concerns regarding consultations or procedures which the PT has had in the interim are  Addressed.continues to be followed by psychiatry and pain mangement , she is stable and doing well The PT denies any adverse reactions to current medications since the last visit.  C/o worsening constipation, despite use of softener and fiber supplement, needs colonoscopy and has decided to get this , requests referral Requests referral to dermatologist       Review of Systems See HPI Denies recent fever or chills. Denies sinus pressure, nasal congestion, ear pain or sore throat.recurrent vewrtigo unchanged, no too severe currently Denies chest congestion, productive cough or wheezing.Short of breath with exertion, will attempt to commit to more regular exercise, plans to join the [facility] Denies chest pains, palpitations and leg swelling    Denies dysuria, frequency, hesitancy has increased  Incontinence with excessive urination at night Chronic  joint pain, swelling and limitation in mobility. Denies  seizures, numbness, or tingling.Has chronic headaches Denies  Uncontrolled  depression, anxiety or insomnia. Denies skin break , has multiple lipomas also requests dermatology evaluation of skin        Objective:   Physical Exam BP 100/60 mmHg  Pulse 79  Resp 16  Ht 5\' 2"  (1.575 m)  Wt 192 lb (87.091 kg)  BMI 35.11 kg/m2  SpO2 96% Patient alert and oriented and in no cardiopulmonary distress.  HEENT: No facial asymmetry, EOMI,   oropharynx pink and moist.  Neck decreased though adequate  no JVD, no mass.  Chest: Clear to auscultation  bilaterally.decreased air entr throughout  CVS: S1, S2 no murmurs, no S3.Regular rate.  ABD: Soft non tender. No guarding or rebound  Ext: No edema  MS: Adequate though reduced  ROM spine, shoulders, hips and knees.  Skin: Intact, multiple lipomas     Psych: Good eye contact, normal affect. Memory intact not anxious or depressed appearing.  CNS: CN 2-12 intact, power,  normal throughout.no focal deficits noted.        Assessment & Plan:  Hypothyroidism Updated lab needed , reviewed after visit when lab drawn currently controlled , no change in medcication   IGT (impaired glucose tolerance) Patient educated about the importance of limiting  Carbohydrate intake , the need to commit to daily physical activity for a minimum of 30 minutes , and to commit weight loss. The fact that changes in all these areas will reduce or eliminate all together the development of diabetes is stressed.   Diabetic Labs Latest Ref Rng 10/25/2014 11/11/2013 11/08/2013 11/08/2013 11/04/2013  HbA1c <5.7 % 5.6 - - - 5.5  Microalbumin 0.00-1.89 mg/dL - - - - -  Micro/Creat Ratio 0.0-30.0 mg/g - - - - -  Chol 125 - 200 mg/dL 228(H) - - - -  HDL >=46 mg/dL 63 - - - -  Calc LDL <130 mg/dL 126 - - - -  Triglycerides <150 mg/dL 195(H) - - - -  Creatinine 0.50 - 1.05 mg/dL 0.77 0.68 0.74 0.80 0.79   BP/Weight 10/25/2014 04/20/2014 11/17/2013 11/16/2013 11/04/2013 06/23/2013 0/10/6759  Systolic BP 950 98 93 932 96 671 245  Diastolic BP  60 62 60 61 60 73 70  Wt. (Lbs) 192 203.12 200 220.5 208 - 216.12  BMI 35.11 37.14 36.57 40.32 38.03 - 39.52  Some encounter information is confidential and restricted. Go to Review Flowsheets activity to see all data.   No flowsheet data found.     BACK PAIN, LUMBAR Chronic , pain management is through pain clinic, increased pain , may need re eval  Headache Denies recurrent  And uncontrolled headaches.Stable currently, however, does fell that neck problems are affecting her  headaches  Obesity (BMI 30-39.9) Improved. Patient re-educated about  the importance of commitment to a  minimum of 150 minutes of exercise per week.  The importance of healthy food choices with portion control discussed. Encouraged to start a food diary, count calories and to consider  joining a support group. Sample diet sheets offered. Goals set by the patient for the next several months.   Weight /BMI 10/25/2014 04/20/2014 11/17/2013  WEIGHT 192 lb 203 lb 1.9 oz 200 lb  HEIGHT 5\' 2"  5\' 2"  5\' 2"   BMI 35.11 kg/m2 37.14 kg/m2 36.57 kg/m2  Some encounter information is confidential and restricted. Go to Review Flowsheets activity to see all data.    Current exercise per week 60 minutes.   Incontinence of urine Worsened symptoms with nocturia, pt to resume medication  Constipation Reports worsening of symptom, and her colonoscopy is past due, was referred at previous visit, will re enter referral, she is advised to call to schedule appt  COPD (chronic obstructive pulmonary disease) Improving  With continued nicotine cessation , no medication change

## 2014-11-13 NOTE — Assessment & Plan Note (Signed)
Denies recurrent  And uncontrolled headaches.Stable currently, however, does fell that neck problems are affecting her headaches

## 2014-11-13 NOTE — Assessment & Plan Note (Signed)
Worsened symptoms with nocturia, pt to resume medication

## 2014-11-13 NOTE — Assessment & Plan Note (Signed)
Patient educated about the importance of limiting  Carbohydrate intake , the need to commit to daily physical activity for a minimum of 30 minutes , and to commit weight loss. The fact that changes in all these areas will reduce or eliminate all together the development of diabetes is stressed.   Diabetic Labs Latest Ref Rng 10/25/2014 11/11/2013 11/08/2013 11/08/2013 11/04/2013  HbA1c <5.7 % 5.6 - - - 5.5  Microalbumin 0.00-1.89 mg/dL - - - - -  Micro/Creat Ratio 0.0-30.0 mg/g - - - - -  Chol 125 - 200 mg/dL 228(H) - - - -  HDL >=46 mg/dL 63 - - - -  Calc LDL <130 mg/dL 126 - - - -  Triglycerides <150 mg/dL 195(H) - - - -  Creatinine 0.50 - 1.05 mg/dL 0.77 0.68 0.74 0.80 0.79   BP/Weight 10/25/2014 04/20/2014 11/17/2013 11/16/2013 11/04/2013 06/23/2013 9/53/2023  Systolic BP 343 98 93 568 96 616 837  Diastolic BP 60 62 60 61 60 73 70  Wt. (Lbs) 192 203.12 200 220.5 208 - 216.12  BMI 35.11 37.14 36.57 40.32 38.03 - 39.52  Some encounter information is confidential and restricted. Go to Review Flowsheets activity to see all data.   No flowsheet data found.

## 2014-11-13 NOTE — Assessment & Plan Note (Signed)
Chronic , pain management is through pain clinic, increased pain , may need re eval

## 2014-11-13 NOTE — Assessment & Plan Note (Signed)
Reports worsening of symptom, and her colonoscopy is past due, was referred at previous visit, will re enter referral, she is advised to call to schedule appt

## 2014-11-13 NOTE — Assessment & Plan Note (Signed)
Updated lab needed , reviewed after visit when lab drawn currently controlled , no change in medcication

## 2014-11-13 NOTE — Assessment & Plan Note (Signed)
Improving  With continued nicotine cessation , no medication change

## 2014-11-13 NOTE — Assessment & Plan Note (Signed)
Improved. Patient re-educated about  the importance of commitment to a  minimum of 150 minutes of exercise per week.  The importance of healthy food choices with portion control discussed. Encouraged to start a food diary, count calories and to consider  joining a support group. Sample diet sheets offered. Goals set by the patient for the next several months.   Weight /BMI 10/25/2014 04/20/2014 11/17/2013  WEIGHT 192 lb 203 lb 1.9 oz 200 lb  HEIGHT 5\' 2"  5\' 2"  5\' 2"   BMI 35.11 kg/m2 37.14 kg/m2 36.57 kg/m2  Some encounter information is confidential and restricted. Go to Review Flowsheets activity to see all data.    Current exercise per week 60 minutes.

## 2014-11-14 ENCOUNTER — Encounter: Payer: Self-pay | Admitting: Family Medicine

## 2014-11-14 NOTE — Assessment & Plan Note (Signed)
Reports inc reased low back pain with radiation , currently on chronic pain contract with pain clinic, feels as tough her symptoms are worsening and requests evaluation by orthopedic Doc, will refer per her request, will leave ortho to order any imaging studies deemed necessary

## 2014-11-14 NOTE — Assessment & Plan Note (Signed)
Controlled, no change in medication  

## 2014-11-14 NOTE — Assessment & Plan Note (Signed)
Antibiotic course and anti inflammatories prescribed, pt to call back in 5 days if not improved, she does have tender right submandibular adenitis

## 2014-11-14 NOTE — Progress Notes (Signed)
   Subjective:    Patient ID: [name] Fitzpatrick, female    DOB: April 23, 1955, 59 y.o.   MRN: [id number]  HPI 4  Day h/o tender swollen glands under right jaw, chills , no fever. Hd a split  in corner of right side of mouth 1 week ago, denies sinus pressure, ear pain , sore throat or productive cough. Denies dental problems C/o increased neck pain causing headaches, wants to see ortho  about this , has had neck surgeries twice in the past,a nd is on chronic pain medication Also requests evaluation for chronic back pain Needs referral about skin conditions which were mentioned at last visit, and still needs her colonoscopy  Review of Systems See HPI Denies recent fever  Denies sinus pressure, nasal congestion, ear pain or sore throat. Denies chest congestion, productive cough or wheezing. Denies chest pains, palpitations and leg swelling Denies abdominal pain, nausea, vomiting,diarrhea or constipation.   Denies dysuria, frequency, hesitancy or incontinence.  Denies uncontrolled  depression, anxiety or insomnia.         Objective:   Physical Exam BP 110/68 mmHg  Pulse 70  Resp 14  Ht 5\' 2"  (1.575 m)  Wt 192 lb (87.091 kg)  BMI 35.11 kg/m2  SpO2 98% Patient alert and oriented and in no cardiopulmonary distress.  HEENT: No facial asymmetry, EOMI,   oropharynx pink and moist.  Neck decreased though adequate ROM,no JVD, tender enlarged lymph nodes under right jaw, in submandibular area Healing open skin lesion at angle of right side of mouth  Chest: Clear to auscultation bilaterally.  CVS: S1, S2 no murmurs, no S3.Regular rate.  ABD: Soft non tender.   Ext: No edema  MS: Decreased  ROM lumbar  spine,  Adequate in shoulders, hips and knees.  Skin: Intact, multiple lipomas, rash also noted .  Psych: Good eye contact, normal affect. Memory intact mildly  anxious not  depressed appearing.  CNS: CN 2-12 intact, power,  normal throughout.no focal deficits noted.          Assessment & Plan:  Neck pain 1 week h/o increased neck pain with cracking ,  [place] [address], sustained neck fracture which was manged conservatively as she has had 2 fusions of c spine in the past. Now daily headaches for 1 week also  Lymphadenitis, acute Antibiotic course and anti inflammatories prescribed, pt to call back in 5 days if not improved, she does have tender right submandibular adenitis  Low back pain Reports inc reased low back pain with radiation , currently on chronic pain contract with pain clinic, feels as tough her symptoms are worsening and requests evaluation by orthopedic Doc, will refer per her request, will leave ortho to order any imaging studies deemed necessary  Multiple lipomas multiple lipomas and requests skin evaluation will refer to dermatology  DEPRESSION Controlled, no change in medication   COPD (chronic obstructive pulmonary disease) Controlled, no change in medication   Hypothyroidism Controlled, no change in medication   Headache Increased in frequency and severity, likley from uncontrolled and worsening neck pathology, pt referred to ortho re neck

## 2014-11-14 NOTE — Assessment & Plan Note (Signed)
multiple lipomas and requests skin evaluation will refer to dermatology

## 2014-11-14 NOTE — Assessment & Plan Note (Signed)
Increased in frequency and severity, likley from uncontrolled and worsening neck pathology, pt referred to ortho re neck

## 2014-11-15 ENCOUNTER — Telehealth: Payer: Self-pay | Admitting: *Deleted

## 2014-11-15 ENCOUNTER — Other Ambulatory Visit: Payer: Self-pay | Admitting: Family Medicine

## 2014-11-15 MED ORDER — DIPHENOXYLATE-ATROPINE 2.5-0.025 MG PO TABS: 1.0000 | ORAL_TABLET | Freq: Four times a day (QID) | ORAL | Status: DC | PRN

## 2014-11-15 NOTE — Telephone Encounter (Signed)
I recommend for the loose stool lomotil and bentyl, lomotil is printed and already has refill on bentyl. If the swollen gland is much improved, may d/c the antibiotic on day 5 instead of 7 days as was originallty prescribed Also mayuse a probiotic oTC to reduce loose stool, no specific brand recommended

## 2014-11-15 NOTE — Telephone Encounter (Signed)
Pt's husband called stating pt has had diarrhea and she thinks it may be from a medicine Dr. Moshe Cipro put her on, pt is requesting something for it. Please advise

## 2014-11-15 NOTE — Telephone Encounter (Signed)
I SPOKE DIRECTLY WITH PT, ADENITIS MUCH IMPROBVED, SHE CHOOSES TO USE OTC IMMODIUM AND WILL D/C THE ANTIBIOTIC ON DAY 5

## 2014-11-15 NOTE — Telephone Encounter (Signed)
Patient states that she is having diarrhea that started on yesterday.  Believes that it is coming from Clindamycin.  Please advise.  Is complaining of stomach cramping.

## 2014-11-18 ENCOUNTER — Encounter (INDEPENDENT_AMBULATORY_CARE_PROVIDER_SITE_OTHER): Payer: Self-pay | Admitting: *Deleted

## 2014-11-24 ENCOUNTER — Telehealth: Payer: Self-pay | Admitting: Family Medicine

## 2014-11-24 NOTE — Telephone Encounter (Signed)
Pls call pt with most recent res, in August if you have not spoken to her , I have a message back to me that she has not read her my chart msg which I sent her

## 2014-11-25 NOTE — Telephone Encounter (Signed)
I spoke with patient already

## 2014-12-06 ENCOUNTER — Ambulatory Visit (INDEPENDENT_AMBULATORY_CARE_PROVIDER_SITE_OTHER): Payer: Self-pay | Admitting: Internal Medicine

## 2014-12-08 ENCOUNTER — Telehealth: Payer: Self-pay | Admitting: Family Medicine

## 2014-12-08 MED ORDER — LEVOTHYROXINE SODIUM 112 MCG PO TABS: 224.0000 ug | ORAL_TABLET | Freq: Every day | ORAL | Status: DC

## 2014-12-08 NOTE — Telephone Encounter (Signed)
Med refill sent in.

## 2014-12-08 NOTE — Telephone Encounter (Signed)
Patient states that her Thyroid Medication never got called in levothyroxine (SYNTHROID, LEVOTHROID) 112 MCG tablet please advise?

## 2015-03-02 ENCOUNTER — Encounter: Payer: Medicare HMO | Admitting: Family Medicine

## 2015-03-10 ENCOUNTER — Ambulatory Visit: Payer: Self-pay | Admitting: Family Medicine

## 2015-03-15 ENCOUNTER — Other Ambulatory Visit: Payer: Self-pay | Admitting: Obstetrics & Gynecology

## 2015-03-15 ENCOUNTER — Other Ambulatory Visit: Payer: Self-pay | Admitting: *Deleted

## 2015-03-15 NOTE — Telephone Encounter (Signed)
Refill for estrace cream completed.

## 2015-03-29 ENCOUNTER — Encounter: Payer: Self-pay | Admitting: Family Medicine

## 2015-03-30 ENCOUNTER — Encounter: Payer: Self-pay | Admitting: Family Medicine

## 2015-03-30 ENCOUNTER — Ambulatory Visit (INDEPENDENT_AMBULATORY_CARE_PROVIDER_SITE_OTHER): Payer: Medicare HMO | Admitting: Family Medicine

## 2015-03-30 VITALS — BP 112/74 | HR 86 | Resp 18 | Ht 62.0 in | Wt 193.0 lb

## 2015-03-30 DIAGNOSIS — R339 Retention of urine, unspecified: Secondary | ICD-10-CM | POA: Diagnosis not present

## 2015-03-30 DIAGNOSIS — J449 Chronic obstructive pulmonary disease, unspecified: Secondary | ICD-10-CM

## 2015-03-30 DIAGNOSIS — E038 Other specified hypothyroidism: Secondary | ICD-10-CM

## 2015-03-30 DIAGNOSIS — Z23 Encounter for immunization: Secondary | ICD-10-CM

## 2015-03-30 DIAGNOSIS — R7302 Impaired glucose tolerance (oral): Secondary | ICD-10-CM

## 2015-03-30 DIAGNOSIS — M25562 Pain in left knee: Secondary | ICD-10-CM | POA: Diagnosis not present

## 2015-03-30 DIAGNOSIS — N39498 Other specified urinary incontinence: Secondary | ICD-10-CM

## 2015-03-30 DIAGNOSIS — F4323 Adjustment disorder with mixed anxiety and depressed mood: Secondary | ICD-10-CM

## 2015-03-30 DIAGNOSIS — M158 Other polyosteoarthritis: Secondary | ICD-10-CM

## 2015-03-30 DIAGNOSIS — Z13220 Encounter for screening for lipoid disorders: Secondary | ICD-10-CM

## 2015-03-30 DIAGNOSIS — M25511 Pain in right shoulder: Secondary | ICD-10-CM

## 2015-03-30 DIAGNOSIS — E669 Obesity, unspecified: Secondary | ICD-10-CM

## 2015-03-30 LAB — POCT URINALYSIS DIPSTICK
Glucose, UA: NEGATIVE
LEUKOCYTES UA: NEGATIVE
NITRITE UA: NEGATIVE
PH UA: 5.5
PROTEIN UA: NEGATIVE
RBC UA: NEGATIVE
Spec Grav, UA: >=1.03
Urobilinogen, UA: 0.2

## 2015-03-30 NOTE — Patient Instructions (Addendum)
Annual wellness in 4.5 month, call if you need me sooner  Flu and pneumonia 23 vaccines today   You are referred to orthopedics re left kneee pain and rigth shoulder pain  You are referred to urologist re incontinence of urine   Fasting lipid and chem 7 as soon as possible  PLEASE schedule colonoscopy, you need this  All the best for 2017!

## 2015-03-30 NOTE — Progress Notes (Signed)
Subjective:    Patient ID: [name] Fitzpatrick, female    DOB: Jan 03, 1956, 60 y.o.   MRN: [id number]  HPI   [name] Fitzpatrick     MRN: [id number]      DOB: 1955/08/29   HPI [name] Fitzpatrick is here for follow up and re-evaluation of chronic medical conditions, medication management and review of any available recent lab and radiology data.  Preventive health is updated, specifically  Cancer screening and Immunization.    The PT denies any adverse reactions to current medications since the last visit.  3 day h/o acute injury to left knee resulting  In uncontrolled pain and instability C/o right shoulder pain and stiffness worsening requests ortho eval for this also C/o worsening urinary symptoms, unable to hold urine, poor stream and feeling of incomplete emptying, will refer to urology  ROS Denies recent fever or chills. Denies sinus pressure, nasal congestion, ear pain or sore throat. Denies chest congestion, productive cough or wheezing. Denies chest pains, palpitations and leg swelling Denies abdominal pain, nausea, vomiting,diarrhea or constipation.   Denies headaches, seizures, numbness, or tingling. Denies uncontrolled depression, anxiety or insomnia. Denies skin break down or rash.   PE  BP 112/74 mmHg  Pulse 86  Resp 18  Ht 5\' 2"  (1.575 m)  Wt 193 lb (87.544 kg)  BMI 35.29 kg/m2  SpO2 98%  Patient alert and oriented and in no cardiopulmonary distress.  HEENT: No facial asymmetry, EOMI,   oropharynx pink and moist.  Neck supple no JVD, no mass.  Chest: Clear to auscultation bilaterally.Decreased though adequate air entry  CVS: S1, S2 no murmurs, no S3.Regular rate.  ABD: Soft non tender.   Ext: No edema  MS: decreased ROM right shoulder and left knee, with point tenderness over medial aspect of left knee  Skin: Intact, no ulcerations or rash noted.  Psych: Good eye contact, normal affect. Memory intact not anxious or depressed appearing.  CNS: CN 2-12  intact, power,  normal throughout.no focal deficits noted.   Assessment & Plan   COPD (chronic obstructive pulmonary disease) Stable, no recent flares. Pneumonia vaccine needs to be updated, same don on day of visit  Incontinence of urine Worsening, with poor stream, and feeling of inadequate emptying when she does void. Needs urology eval and management , referral entered  Knee pain, left Acute left knee pain following a sudden twist while attempting to get out of bed. Localized tenderness and instability, ortho to eval and treat.   Shoulder pain, right Chronic right shoulder pain with reduced ROM, wants ortho eval will refer for this also  Hypothyroidism Updated lab needed at/ before next visit.   Adjustment reaction with anxiety and depression Stable and controlled , treated by psychiatry  Osteoarthritis Stable except foe acute left knee injury and worsening right shoulder pain Chronic pain management to continue as before  Obesity (BMI 30-39.9) Deteriorated. Patient re-educated about  the importance of commitment to a  minimum of 150 minutes of exercise per week.  The importance of healthy food choices with portion control discussed. Encouraged to start a food diary, count calories and to consider  joining a support group. Sample diet sheets offered. Goals set by the patient for the next several months.   Weight /BMI 03/30/2015 11/11/2014 10/25/2014  WEIGHT 193 lb 192 lb 192 lb  HEIGHT 5\' 2"  5\' 2"  5\' 2"   BMI 35.29 kg/m2 35.11 kg/m2 35.11 kg/m2  Some encounter information is confidential and restricted. Go to Review Flowsheets  activity to see all data.    Current exercise per week 60  minutes.   Urinary incontinence Refer to urology       Review of Systems     Objective:   Physical Exam        Assessment & Plan:

## 2015-04-03 DIAGNOSIS — M25511 Pain in right shoulder: Secondary | ICD-10-CM | POA: Insufficient documentation

## 2015-04-03 DIAGNOSIS — R32 Unspecified urinary incontinence: Secondary | ICD-10-CM | POA: Insufficient documentation

## 2015-04-03 DIAGNOSIS — M25562 Pain in left knee: Secondary | ICD-10-CM | POA: Insufficient documentation

## 2015-04-03 NOTE — Assessment & Plan Note (Signed)
Chronic right shoulder pain with reduced ROM, wants ortho eval will refer for this also

## 2015-04-03 NOTE — Assessment & Plan Note (Signed)
Worsening, with poor stream, and feeling of inadequate emptying when she does void. Needs urology eval and management , referral entered

## 2015-04-03 NOTE — Assessment & Plan Note (Signed)
Deteriorated. Patient re-educated about  the importance of commitment to a  minimum of 150 minutes of exercise per week.  The importance of healthy food choices with portion control discussed. Encouraged to start a food diary, count calories and to consider  joining a support group. Sample diet sheets offered. Goals set by the patient for the next several months.   Weight /BMI 03/30/2015 11/11/2014 10/25/2014  WEIGHT 193 lb 192 lb 192 lb  HEIGHT 5\' 2"  5\' 2"  5\' 2"   BMI 35.29 kg/m2 35.11 kg/m2 35.11 kg/m2  Some encounter information is confidential and restricted. Go to Review Flowsheets activity to see all data.    Current exercise per week 60  minutes.

## 2015-04-03 NOTE — Assessment & Plan Note (Signed)
Updated lab needed at/ before next visit.   

## 2015-04-03 NOTE — Assessment & Plan Note (Signed)
Stable and controlled, treated by psychiatry 

## 2015-04-03 NOTE — Assessment & Plan Note (Signed)
Acute left knee pain following a sudden twist while attempting to get out of bed. Localized tenderness and instability, ortho to eval and treat.

## 2015-04-03 NOTE — Assessment & Plan Note (Signed)
Refer to urology.  ?

## 2015-04-03 NOTE — Assessment & Plan Note (Signed)
Stable, no recent flares. Pneumonia vaccine needs to be updated, same don on day of visit

## 2015-04-03 NOTE — Assessment & Plan Note (Signed)
Stable except foe acute left knee injury and worsening right shoulder pain Chronic pain management to continue as before

## 2015-04-14 ENCOUNTER — Telehealth: Payer: Self-pay

## 2015-04-14 MED ORDER — ALPRAZOLAM 0.25 MG PO TABS: 0.2500 mg | ORAL_TABLET | Freq: Every evening | ORAL | Status: DC | PRN

## 2015-04-14 NOTE — Telephone Encounter (Signed)
Husband made aware.  Med to be sent to [place]

## 2015-04-14 NOTE — Telephone Encounter (Signed)
Husband called in worried about patient- states every since her mother passed away last 2022/07/04 she hasn't been able to eat much of anything and when she does, she vomits it back up. Doesn't sleep at night and walks the halls- states its her nerves and has a "shake" since it happened. Has been taking promethazine for the vomiting but its not helping- husband thinks its more due to her nerves and her grief. Is there anything that can be prescribed short term to help her? Please advise

## 2015-04-14 NOTE — Telephone Encounter (Signed)
Sorry to hear of this. Does get meds from psych Se  I will send xanax for bedtime only  For 1 week, NEEDS tO SEE PSYCH

## 2015-05-17 IMAGING — CR DG LUMBAR SPINE COMPLETE 4+V
5 series · 5 of 5 positions shown · non-contrast
Comparison: Lumbar spine series of November 08, 2013

CLINICAL DATA: Status post fall now with right hip and low back
pain with history of previous compression fracture and lower lumbar
spinal fusion ; the patient underwent vertebroplasty of L1 on November 11, 2013

EXAM:
LUMBAR SPINE - COMPLETE 4+ VIEW

[view not recorded (1 of 5)]
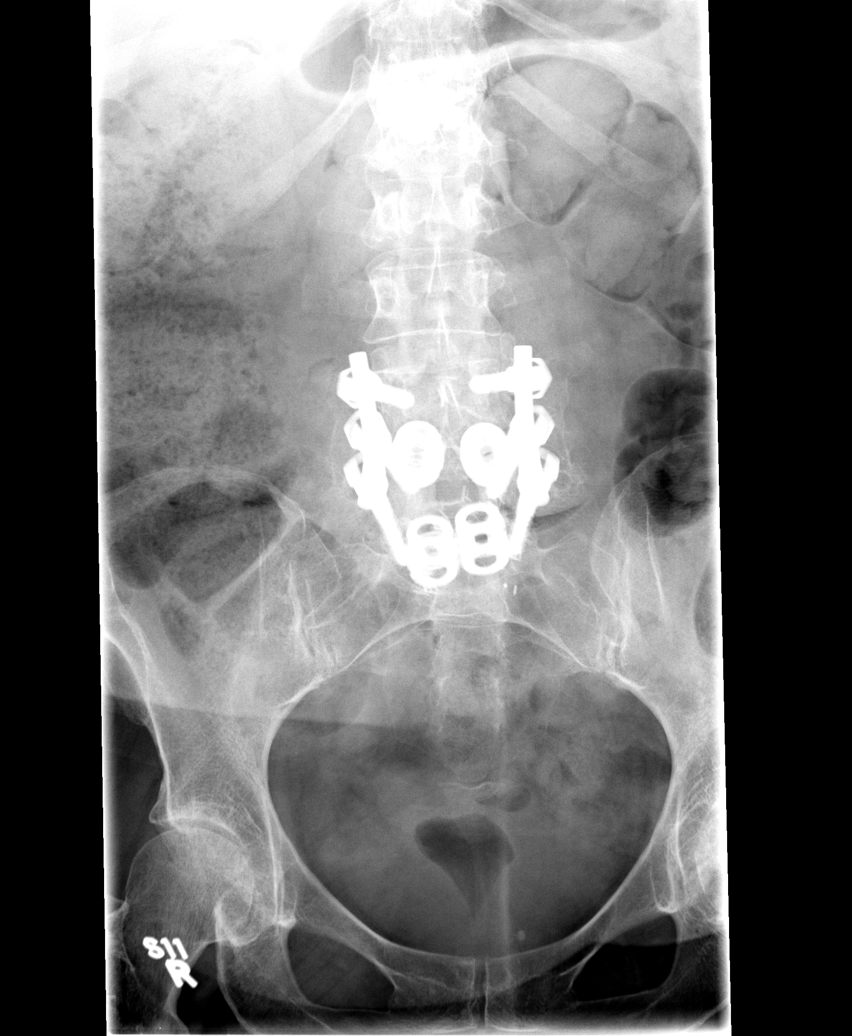

[view not recorded (2 of 5)]
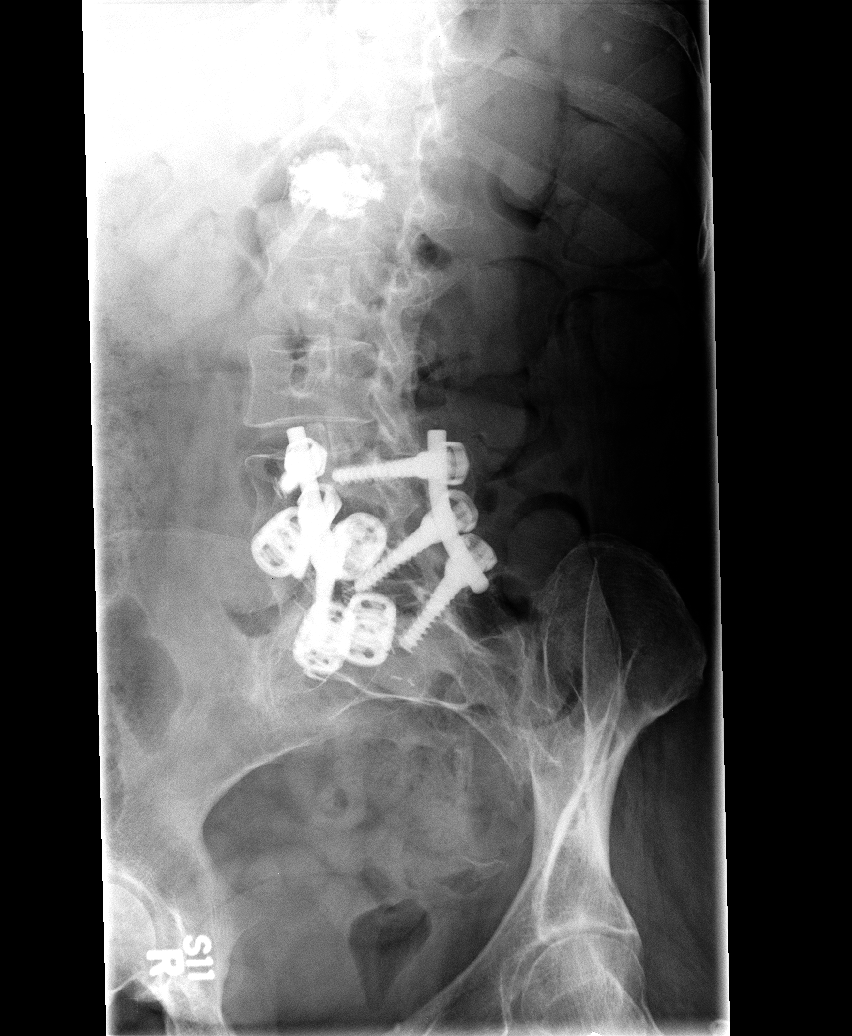

[view not recorded (3 of 5)]
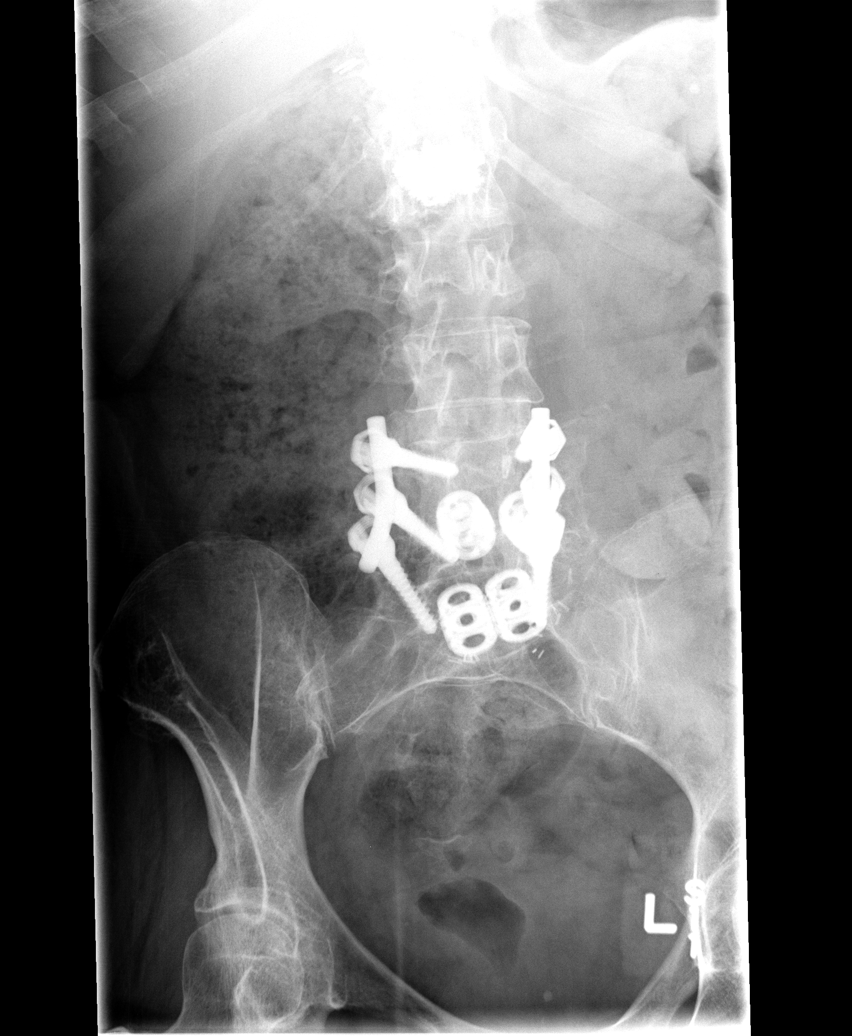

[view not recorded (4 of 5)]
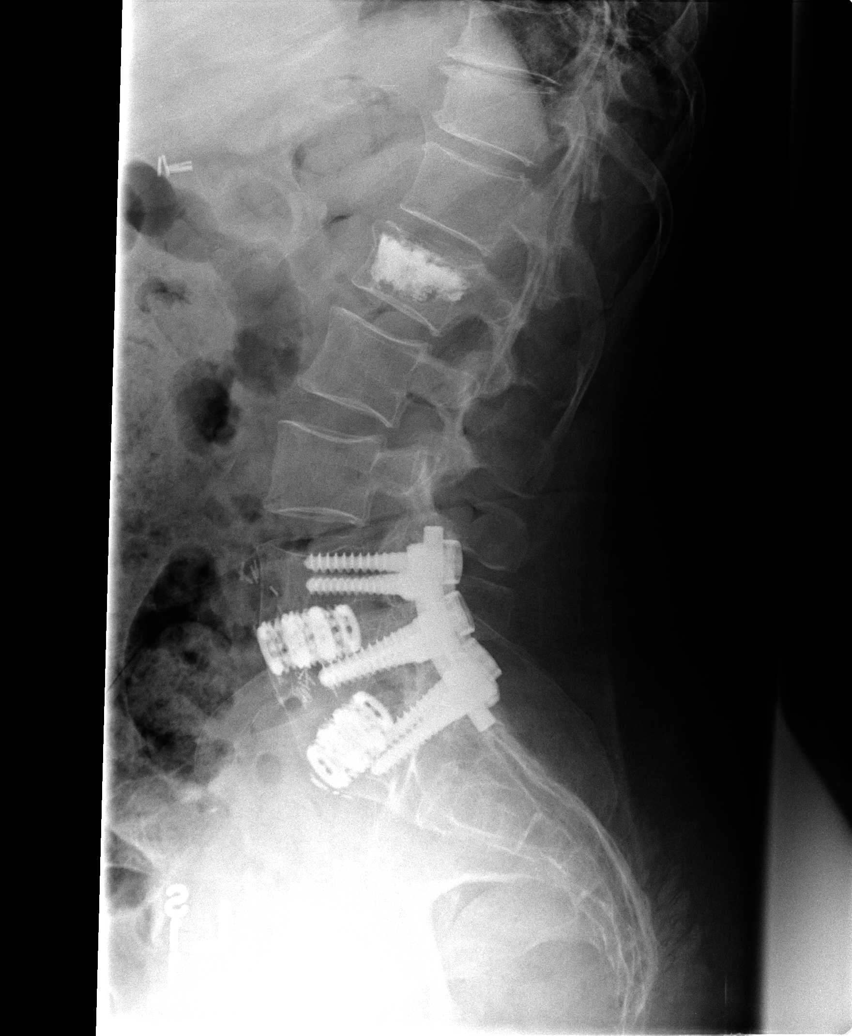

[view not recorded (5 of 5)]
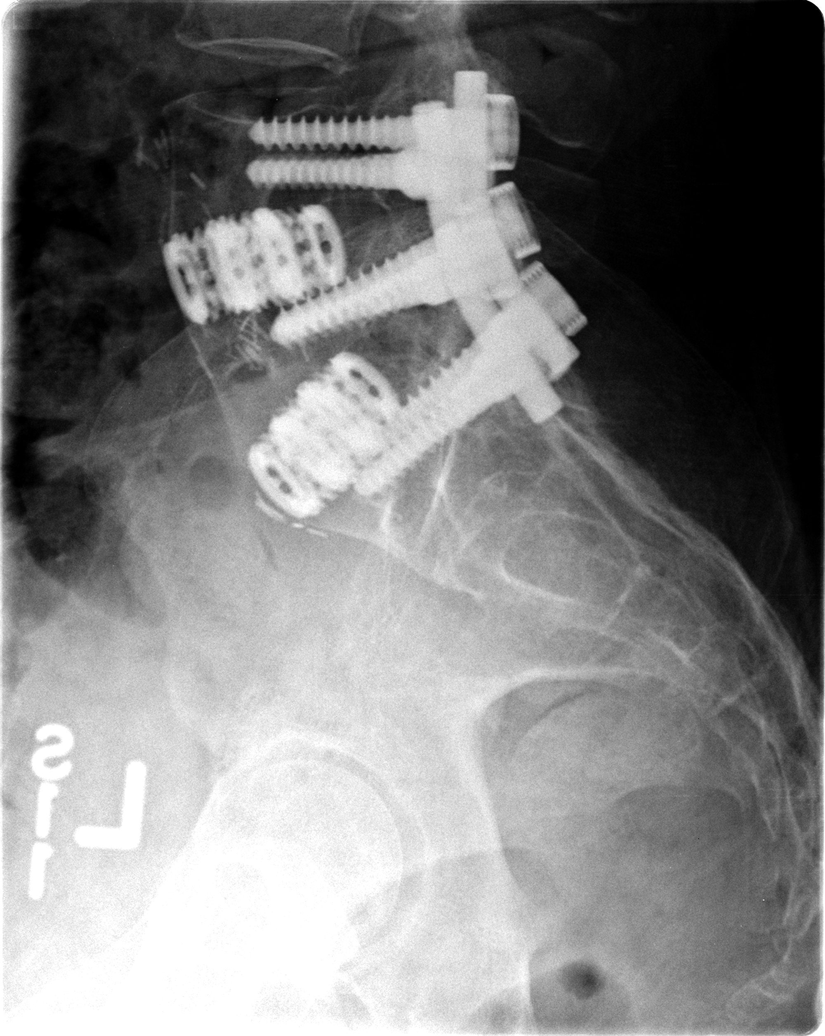

[5 of 5 positions shown; findings below may reference images not displayed]

FINDINGS: Post kyphoplasty changes at L1 are present. The metallic hardware it
L4-5 and L5-S1 is intact. No acute lumbar spine fracture is
demonstrated. The observed portions of the sacrum are unremarkable.
IMPRESSION: No acute bony abnormality of the lumbar spine is demonstrated.

## 2015-05-19 ENCOUNTER — Telehealth: Payer: Self-pay | Admitting: Family Medicine

## 2015-05-19 DIAGNOSIS — Z1211 Encounter for screening for malignant neoplasm of colon: Secondary | ICD-10-CM

## 2015-05-19 NOTE — Telephone Encounter (Signed)
Patient is asking for a call back from [facility] about scheduling a colonoscopy, please advise?

## 2015-05-20 NOTE — Telephone Encounter (Signed)
Referred for colonoscopy

## 2015-05-20 NOTE — Telephone Encounter (Signed)
Called patient, no answer and no option to leave message

## 2015-05-20 NOTE — Addendum Note (Signed)
Addended by: Eual Fines on: 05/20/2015 02:16 PM   Modules accepted: Orders

## 2015-05-23 ENCOUNTER — Encounter (INDEPENDENT_AMBULATORY_CARE_PROVIDER_SITE_OTHER): Payer: Self-pay | Admitting: *Deleted

## 2015-05-23 ENCOUNTER — Telehealth: Payer: Self-pay | Admitting: Family Medicine

## 2015-05-23 NOTE — Telephone Encounter (Signed)
Patient is stating that she is having a lot of trouble with her stomach and is asking for Omeprzole to be called in to [facility] in [place], she no longer uses [employer]

## 2015-05-23 NOTE — Telephone Encounter (Signed)
pls get more specifics in history. If classic burning etc Ok 1 month onwe refill of 40 mg dose, BUT still needs gI eval for colonoscopy, so encourage her to follow through on that. If dysphagia  And pain in stomach needs gI also

## 2015-05-23 NOTE — Telephone Encounter (Signed)
Please advise.   Omeprazole not on current or past med list.

## 2015-05-24 ENCOUNTER — Other Ambulatory Visit: Payer: Self-pay

## 2015-05-24 ENCOUNTER — Encounter (INDEPENDENT_AMBULATORY_CARE_PROVIDER_SITE_OTHER): Payer: Self-pay | Admitting: *Deleted

## 2015-05-24 ENCOUNTER — Telehealth: Payer: Self-pay

## 2015-05-24 DIAGNOSIS — E038 Other specified hypothyroidism: Secondary | ICD-10-CM

## 2015-05-24 DIAGNOSIS — E669 Obesity, unspecified: Secondary | ICD-10-CM

## 2015-05-24 DIAGNOSIS — E559 Vitamin D deficiency, unspecified: Secondary | ICD-10-CM

## 2015-05-24 DIAGNOSIS — R1310 Dysphagia, unspecified: Secondary | ICD-10-CM

## 2015-05-24 DIAGNOSIS — E063 Autoimmune thyroiditis: Secondary | ICD-10-CM | POA: Insufficient documentation

## 2015-05-24 MED ORDER — OMEPRAZOLE 40 MG PO CPDR: 40.0000 mg | DELAYED_RELEASE_CAPSULE | Freq: Every day | ORAL | Status: DC

## 2015-05-24 NOTE — Telephone Encounter (Signed)
States that she has a diagnosis of hashmotos thyroiditis (I added to her problem list) and the only labs ordered were a lipid and BMP and she is supposed to have special labs ordered to check her thyroid. Anything else you need to add to her order?

## 2015-05-24 NOTE — Telephone Encounter (Signed)
Labs printed and put up front to collect

## 2015-05-24 NOTE — Telephone Encounter (Signed)
See next telephone message. 

## 2015-05-24 NOTE — Telephone Encounter (Signed)
Called and left message for patient to return call.  

## 2015-05-24 NOTE — Addendum Note (Signed)
Addended by: Eual Fines on: 05/24/2015 04:47 PM   Modules accepted: Orders

## 2015-05-24 NOTE — Telephone Encounter (Signed)
TSH, free T3 and free T4, vit D, cBC please

## 2015-06-01 ENCOUNTER — Ambulatory Visit (INDEPENDENT_AMBULATORY_CARE_PROVIDER_SITE_OTHER): Payer: Medicare HMO | Admitting: Family Medicine

## 2015-06-01 VITALS — BP 114/74 | HR 87 | Resp 16 | Ht 62.0 in

## 2015-06-01 DIAGNOSIS — E063 Autoimmune thyroiditis: Secondary | ICD-10-CM | POA: Diagnosis not present

## 2015-06-01 DIAGNOSIS — M158 Other polyosteoarthritis: Secondary | ICD-10-CM | POA: Diagnosis not present

## 2015-06-01 DIAGNOSIS — G44211 Episodic tension-type headache, intractable: Secondary | ICD-10-CM

## 2015-06-01 DIAGNOSIS — M79641 Pain in right hand: Secondary | ICD-10-CM

## 2015-06-01 DIAGNOSIS — M79642 Pain in left hand: Secondary | ICD-10-CM

## 2015-06-01 DIAGNOSIS — R110 Nausea: Secondary | ICD-10-CM

## 2015-06-01 DIAGNOSIS — Z0000 Encounter for general adult medical examination without abnormal findings: Secondary | ICD-10-CM | POA: Diagnosis not present

## 2015-06-01 DIAGNOSIS — E890 Postprocedural hypothyroidism: Secondary | ICD-10-CM

## 2015-06-01 NOTE — Progress Notes (Signed)
Subjective:    Patient ID: [name] Fitzpatrick, female    DOB: 09-13-1955, 60 y.o.   MRN: [id number]  HPI Preventive Screening-Counseling & Management   Patient present here today for a Medicare annual wellness visit. C/o headache x 1 week, frontal  And also of nausea associated with her headache. C/o increased joint pain and swelling of her fingers and hands , with decreased ability to use them, requests further evaluation by specialist   Current Problems (verified)   Medications Prior to Visit Allergies (verified)   PAST HISTORY  Family History  Social History : married , disabled, no current nicotine, alcohol or drug use   Risk Factors  Current exercise habits: When its warm she walks as able   Dietary issues discussed: Importance of reducing carbs and fried fatty foods and eating more fruits and vegetables   Cardiac risk factors: None significant Depression Screen  (Note: if answer to either of the following is "Yes", a more complete depression screening is indicated)   Over the past two weeks, have you felt down, depressed or hopeless? Has some depression , treated by psychiatry, not suicidal or homicidal, recently lost her Mom Over the past two weeks, have you felt little interest or pleasure in doing things? No  Have you lost interest or pleasure in daily life? No  Do you often feel hopeless? No  Do you cry easily over simple problems? No   Activities of Daily Living  In your present state of health, do you have any difficulty performing the following activities?  Driving?:  Managing money?: No Feeding yourself?:No Getting from bed to chair?:No Climbing a flight of stairs?: has to take her time  Preparing food and eating?:No Bathing or showering?: uses shower chair  Getting dressed?:No Getting to the toilet?:No Using the toilet?:No Moving around from place to place?: sometimes, uses a cane to get around   Fall Risk Assessment In the past year have you  fallen or had a near fall?: 2 times Are you currently taking any medications that make you dizzy?:No   Hearing Difficulties: No Do you often ask people to speak up or repeat themselves?:No Do you experience ringing or noises in your ears?:No Do you have difficulty understanding soft or whispered voices?:No  Cognitive Testing  Alert? Yes Normal Appearance?Yes  Oriented to person? Yes Place? Yes  Time? Yes  Displays appropriate judgment?Yes  Can read the correct time from a watch face? yes Are you having problems remembering things? Some forgetfullness  Advanced Directives have been discussed with the patient?Yes    List the Names of Other Physician/Practitioners you currently use:  Nicholaus Bloom (pain management) [facility] (GI) [facility]  Indicate any recent Medical Services you may have received from other than [facility] providers in the past year (date may be approximate).   Assessment:    Annual Wellness Exam   Plan:     Medicare Attestation  I have personally reviewed:  The patient's medical and social history  Their use of alcohol, tobacco or illicit drugs  Their current medications and supplements  The patient's functional ability including ADLs,fall risks, home safety risks, cognitive, and hearing and visual impairment  Diet and physical activities  Evidence for depression or mood disorders  The patient's weight, height, BMI, and visual acuity have been recorded in the chart. I have made referrals, counseling, and provided education to the patient based on review of the above and I have provided the patient with a written personalized care plan for  preventive services.  ( UNABLE TO WEIGH DUE TO LOPSS OF POWER)    Review of Systems     Objective:   Physical Exam BP 114/74 mmHg  Pulse 87  Resp 16  Ht 5\' 2"  (1.575 m)  SpO2 99%  CNS: CN 2 to 12 intact, no focal deficits on exam. Power , tone and sensation normal in all 4 extremities.  MS: mild deformity and  swelling of digits of hands        Assessment & Plan:  Medicare annual wellness visit, subsequent Annual exam as documented. Counseling done  re healthy lifestyle involving commitment to 150 minutes exercise per week, heart healthy diet, and attaining healthy weight.The importance of adequate sleep also discussed. Regular seat belt use and home safety, is also discussed. Changes in health habits are decided on by the patient with goals and time frames  set for achieving them. Immunization and cancer screening needs are specifically addressed at this visit.   Headache Acute headache, toradol 60 mg IM administered in office  Nausea without vomiting Zofran 4mg  IM administered at visit  Nausea Associated with headache , no change in BM, zofran administered in office  Bilateral hand pain C/o worsening hand pain bil;aterally with some swelling and deformity of the joints in her fingers. Requests rheumatology evaluation as reports reduced function in hands also

## 2015-06-02 ENCOUNTER — Encounter: Payer: Self-pay | Admitting: Family Medicine

## 2015-06-02 DIAGNOSIS — M158 Other polyosteoarthritis: Secondary | ICD-10-CM | POA: Diagnosis not present

## 2015-06-02 DIAGNOSIS — R110 Nausea: Secondary | ICD-10-CM | POA: Diagnosis not present

## 2015-06-02 MED ORDER — ONDANSETRON HCL 4 MG/2ML IJ SOLN
4.0000 mg | Freq: Once | INTRAMUSCULAR | Status: AC
  Administered 2015-06-02: 4 mg via INTRAMUSCULAR

## 2015-06-02 MED ORDER — KETOROLAC TROMETHAMINE 60 MG/2ML IM SOLN
60.0000 mg | Freq: Once | INTRAMUSCULAR | Status: AC
  Administered 2015-06-02: 60 mg via INTRAMUSCULAR

## 2015-06-06 ENCOUNTER — Encounter: Payer: Self-pay | Admitting: Family Medicine

## 2015-06-06 DIAGNOSIS — Z0000 Encounter for general adult medical examination without abnormal findings: Secondary | ICD-10-CM | POA: Insufficient documentation

## 2015-06-06 DIAGNOSIS — R110 Nausea: Secondary | ICD-10-CM | POA: Insufficient documentation

## 2015-06-06 NOTE — Assessment & Plan Note (Signed)

## 2015-06-06 NOTE — Assessment & Plan Note (Signed)
Zofran 4mg  IM administered at visit

## 2015-06-06 NOTE — Assessment & Plan Note (Signed)
Acute headache, toradol 60 mg IM administered in office

## 2015-06-07 ENCOUNTER — Telehealth: Payer: Self-pay | Admitting: Family Medicine

## 2015-06-07 ENCOUNTER — Other Ambulatory Visit: Payer: Self-pay | Admitting: Family Medicine

## 2015-06-07 DIAGNOSIS — M79642 Pain in left hand: Secondary | ICD-10-CM

## 2015-06-07 DIAGNOSIS — M79641 Pain in right hand: Secondary | ICD-10-CM | POA: Insufficient documentation

## 2015-06-07 MED ORDER — LEVOTHYROXINE SODIUM 112 MCG PO TABS: 224.0000 ug | ORAL_TABLET | Freq: Every day | ORAL | Status: DC

## 2015-06-07 NOTE — Assessment & Plan Note (Signed)
Associated with headache , no change in BM, zofran administered in office

## 2015-06-07 NOTE — Assessment & Plan Note (Signed)
C/o worsening hand pain bil;aterally with some swelling and deformity of the joints in her fingers. Requests rheumatology evaluation as reports reduced function in hands also

## 2015-06-07 NOTE — Telephone Encounter (Signed)
[name] Fitzpatrick is home sick in the bed and has not had an opportunity to go have labs done yet, she is completely out of her levothyroxine (SYNTHROID, LEVOTHROID) 112 MCG tablet  And is asking for a refill until she can have her lab work done

## 2015-06-07 NOTE — Telephone Encounter (Signed)
Pls call pt and let her know still looking out for labs she was unable to get last week Her DI are being mailed/ she may collect them She has been referred to rheumatologist per her request re hand pain, and remind her of importance of getting the colonoscopy pls ,past due Thanks

## 2015-06-07 NOTE — Telephone Encounter (Signed)
1 month sent in 

## 2015-06-07 NOTE — Patient Instructions (Signed)
F/u in 4 month, call if you need me sooner  Injections today for headache and nausea  You will be referred to a rheumatologist to further evaluate joint pain and swelling affecting both hands   Labs as soon as possible, sorry unable too have them drawn today because of power outage  Thanks for choosing [facility], we consider it a privelige to serve you.  Please follow through with colonoscopy which is overdue, very important to have this done. Care with getting around so that you do not fall, and try to keep your home environment as safe as possible, handout on this sheet.       Fall Prevention in the Home  Falls can cause injuries. They can happen to people of all ages. There are many things you can do to make your home safe and to help prevent falls.  WHAT CAN I DO ON THE OUTSIDE OF MY HOME?  Regularly fix the edges of walkways and driveways and fix any cracks.  Remove anything that might make you trip as you walk through a door, such as a raised step or threshold.  Trim any bushes or trees on the path to your home.  Use bright outdoor lighting.  Clear any walking paths of anything that might make someone trip, such as rocks or tools.  Regularly check to see if handrails are loose or broken. Make sure that both sides of any steps have handrails.  Any raised decks and porches should have guardrails on the edges.  Have any leaves, snow, or ice cleared regularly.  Use sand or salt on walking paths during winter.  Clean up any spills in your garage right away. This includes oil or grease spills. WHAT CAN I DO IN THE BATHROOM?   Use night lights.  Install grab bars by the toilet and in the tub and shower. Do not use towel bars as grab bars.  Use non-skid mats or decals in the tub or shower.  If you need to sit down in the shower, use a plastic, non-slip stool.  Keep the floor dry. Clean up any water that spills on the floor as soon as it  happens.  Remove soap buildup in the tub or shower regularly.  Attach bath mats securely with double-sided non-slip rug tape.  Do not have throw rugs and other things on the floor that can make you trip. WHAT CAN I DO IN THE BEDROOM?  Use night lights.  Make sure that you have a light by your bed that is easy to reach.  Do not use any sheets or blankets that are too big for your bed. They should not hang down onto the floor.  Have a firm chair that has side arms. You can use this for support while you get dressed.  Do not have throw rugs and other things on the floor that can make you trip. WHAT CAN I DO IN THE KITCHEN?  Clean up any spills right away.  Avoid walking on wet floors.  Keep items that you use a lot in easy-to-reach places.  If you need to reach something above you, use a strong step stool that has a grab bar.  Keep electrical cords out of the way.  Do not use floor polish or wax that makes floors slippery. If you must use wax, use non-skid floor wax.  Do not have throw rugs and other things on the floor that can make you trip. WHAT CAN I DO WITH MY  STAIRS?  Do not leave any items on the stairs.  Make sure that there are handrails on both sides of the stairs and use them. Fix handrails that are broken or loose. Make sure that handrails are as long as the stairways.  Check any carpeting to make sure that it is firmly attached to the stairs. Fix any carpet that is loose or worn.  Avoid having throw rugs at the top or bottom of the stairs. If you do have throw rugs, attach them to the floor with carpet tape.  Make sure that you have a light switch at the top of the stairs and the bottom of the stairs. If you do not have them, ask someone to add them for you. WHAT ELSE CAN I DO TO HELP PREVENT FALLS?  Wear shoes that:  Do not have high heels.  Have rubber bottoms.  Are comfortable and fit you well.  Are closed at the toe. Do not wear sandals.  If you  use a stepladder:  Make sure that it is fully opened. Do not climb a closed stepladder.  Make sure that both sides of the stepladder are locked into place.  Ask someone to hold it for you, if possible.  Clearly mark and make sure that you can see:  Any grab bars or handrails.  First and last steps.  Where the edge of each step is.  Use tools that help you move around (mobility aids) if they are needed. These include:  Canes.  Walkers.  Scooters.  Crutches.  Turn on the lights when you go into a dark area. Replace any light bulbs as soon as they burn out.  Set up your furniture so you have a clear path. Avoid moving your furniture around.  If any of your floors are uneven, fix them.  If there are any pets around you, be aware of where they are.  Review your medicines with your doctor. Some medicines can make you feel dizzy. This can increase your chance of falling. Ask your doctor what other things that you can do to help prevent falls.   This information is not intended to replace advice given to you by your health care provider. Make sure you discuss any questions you have with your health care provider.   Document Released: 12/30/2008 Document Revised: 07/20/2014 Document Reviewed: 04/09/2014 Elsevier Interactive Patient Education [employer].

## 2015-06-09 ENCOUNTER — Telehealth: Payer: Self-pay | Admitting: Family Medicine

## 2015-06-09 NOTE — Telephone Encounter (Signed)
Done

## 2015-06-09 NOTE — Telephone Encounter (Signed)
Patient is calling asking for something to be called in for her, she has a croupy cough, running fever, no energy, please advise?

## 2015-06-10 ENCOUNTER — Other Ambulatory Visit: Payer: Self-pay

## 2015-06-10 ENCOUNTER — Telehealth: Payer: Self-pay

## 2015-06-10 MED ORDER — OSELTAMIVIR PHOSPHATE 75 MG PO CAPS: 75.0000 mg | ORAL_CAPSULE | Freq: Two times a day (BID) | ORAL | Status: DC

## 2015-06-10 MED ORDER — DOXYCYCLINE HYCLATE 100 MG PO TABS: 100.0000 mg | ORAL_TABLET | Freq: Two times a day (BID) | ORAL | Status: DC

## 2015-06-10 MED ORDER — MAGIC MOUTHWASH: 5.0000 mL | Freq: Four times a day (QID) | ORAL | Status: DC | PRN

## 2015-06-10 MED ORDER — PROMETHAZINE-DM 6.25-15 MG/5ML PO SYRP: 5.0000 mL | ORAL_SOLUTION | Freq: Every evening | ORAL | Status: DC | PRN

## 2015-06-10 NOTE — Telephone Encounter (Signed)
Doxycycline and  tamiflu sentt. Offer and send if she wishes phenergan DM 5 cc at betdtime X 118 cc, also tessalon perles 100 mg twice dailya s needed #20 pls  (Remind her still need blood work which she came prepared to do at last viosit but lack of electricity stopped her)

## 2015-06-10 NOTE — Telephone Encounter (Signed)
Started Tuesday, coughing up grayish phlegm, hoarse, bodyaches, chills, and fever of up to 102, no energy. Wants something sent to [facility]

## 2015-06-10 NOTE — Telephone Encounter (Signed)
Verbal order received for magic mouthwash.    Medication sent to pharmacy.

## 2015-06-10 NOTE — Telephone Encounter (Signed)
Patient aware and med sent  

## 2015-06-13 NOTE — Telephone Encounter (Signed)
Noted, thanks!

## 2015-06-20 ENCOUNTER — Ambulatory Visit (INDEPENDENT_AMBULATORY_CARE_PROVIDER_SITE_OTHER): Payer: Medicare HMO | Admitting: Internal Medicine

## 2015-06-20 ENCOUNTER — Encounter (INDEPENDENT_AMBULATORY_CARE_PROVIDER_SITE_OTHER): Payer: Self-pay | Admitting: Internal Medicine

## 2015-06-20 ENCOUNTER — Telehealth: Payer: Self-pay | Admitting: Family Medicine

## 2015-06-20 VITALS — BP 94/60 | HR 64 | Temp 98.9°F | Ht 62.0 in | Wt 182.2 lb

## 2015-06-20 DIAGNOSIS — Z800 Family history of malignant neoplasm of digestive organs: Secondary | ICD-10-CM | POA: Diagnosis not present

## 2015-06-20 DIAGNOSIS — R1310 Dysphagia, unspecified: Secondary | ICD-10-CM

## 2015-06-20 DIAGNOSIS — R195 Other fecal abnormalities: Secondary | ICD-10-CM

## 2015-06-20 NOTE — Telephone Encounter (Signed)
I recommend CXR, sputum c/s and if she has sinus drainage , still has not had blood tests needs them add cBC if not on original order pls, holdinmg on antibiotic pending data. May send tesslon perles 100 mg twice daily as needed #14 and phenergan dm 5 cc twice daily as needed x 180 ccin interim

## 2015-06-20 NOTE — Telephone Encounter (Signed)
Patient is complaining of still coughing, the medication that was called in before does not work, please advise? Patient also states she had labs drawn today

## 2015-06-20 NOTE — Telephone Encounter (Signed)
Appended message- cough is not a dry cough but she is producing yellow mucus, sometimes its dry also but it has been going on for awhile. Please advise is anything else can be called in

## 2015-06-20 NOTE — Patient Instructions (Addendum)
EGD/Colonoscopy. The risks and benefits such as perforation, bleeding, and infection were reviewed with the patient and is agreeable. Omeprazole 40mg  30 minutes before breakfast and 30 minutes before supper.

## 2015-06-20 NOTE — Progress Notes (Signed)
Subjective:    Patient ID: [name] Fitzpatrick, female    DOB: 1956-01-03, 60 y.o.   MRN: [id number]  HPIReferred by Dr. Moshe Cipro for dysphagia. She tells me in the last year she feels like foods are lodging in her lower esophagus. She has some epigastric tenderness. She says she has had a change in her stools. They are long and worm like and some are like balls. She has a lot of mucous with her stools, Hx of IBS. She tells me sometimes her stools float.  She says she does not have an appetite. She says she has had weight loss.  She has chronic nausea and sometimes she has vomiting.  Sometimes she has trouble swallowing water.  She tells me she has had a colonoscopy years ago. Father died of pancreatic cancer. Maternal grandfather had colon cancer age late 40s.  Chronic pain and takes Dilaudid and Methadone.   Review of Systems Past Medical History  Diagnosis Date  . Anxiety   . Depression   . Hypothyroidism   . Osteoarthritis   . IBS (irritable bowel syndrome)     with constipation  . Fibromyalgia   . Chronic LBP   . Migraine headache   . Chronic neck pain   . DM type 2 (diabetes mellitus, type 2) ([place])   . Hypertension   . COPD (chronic obstructive pulmonary disease) ([place])   . H/O suicide attempt   . 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 Reconstucted right ankle    . C-spine fusion  1991, 2004  . Vesicovaginal fistula closure w/ tah  1993    Fibroids no Ca  . Right knee arthroscopic surgery    . B/l foot surgery --bunions    . L carpal tunnel release    . L spine fusion x 2    . Total reconstruction of right ankle and heel      total of three   . Abdominal hysterectomy    . Cholecystectomy    . Total thyroidectomy  11/30/2013    Wake due chronic lymphocytic thyroiditis    Allergies  Allergen Reactions  . Prednisone Shortness Of Breath and Nausea And Vomiting  .  Penicillins Other (See Comments)    Pt states she almost died after taking the following. She states that she had Stroke like symptoms post taking the medication  . Cephalexin Hives, Swelling and Rash  . Latex Rash and Other (See Comments)    Causes skin to tear easily  . Metoclopramide Hcl Hives and Rash    Current Outpatient Prescriptions on File Prior to Visit  Medication Sig Dispense Refill  . aspirin EC 81 MG tablet Take 81 mg by mouth daily.    . diazepam (VALIUM) 5 MG tablet TK SS TO ONE T PO QD PRF ANXIETY  2  . dicyclomine (BENTYL) 10 MG capsule TAKE 1 CAPSULE BY MOUTH TWICE DAILY. 60 capsule 3  . fluticasone (FLONASE) 50 MCG/ACT nasal spray 2 SPRAYS EACH NOSTRIL ONCE DAILY. 16 g 3  . gabapentin (NEURONTIN) 300 MG capsule TK 2 CS PO Q 8 H  1  . HYDROmorphone (DILAUDID) 4 MG tablet TK 1 T PO Q 6 H PRN  0  . ibuprofen (ADVIL,MOTRIN) 600 MG tablet One tablet 3 times daily for 1 week 21 tablet 0  . levothyroxine (SYNTHROID, LEVOTHROID) 112 MCG tablet TAKE 2 TABLETS(224 MCG) BY MOUTH  DAILY BEFORE BREAKFAST 180 tablet 0  . lidocaine (LIDODERM) 5 % Place 1 patch onto the skin daily.     . magic mouthwash SOLN Take 5 mLs by mouth 4 (four) times daily as needed for mouth pain. 237 mL 0  . meclizine (ANTIVERT) 12.5 MG tablet Take 1 tablet (12.5 mg total) by mouth 3 (three) times daily as needed for dizziness. 30 tablet 1  . methadone (DOLOPHINE) 10 MG tablet Take 1.5 tablets (15 mg total) by mouth 3 (three) times daily. 15 tablet 0  . omeprazole (PRILOSEC) 40 MG capsule Take 1 capsule (40 mg total) by mouth daily. 30 capsule 3  . promethazine (PHENERGAN) 25 MG tablet Take 1 tablet (25 mg total) by mouth daily as needed for nausea or vomiting. 30 tablet 5  . promethazine-dextromethorphan (PROMETHAZINE-DM) 6.25-15 MG/5ML syrup Take 5 mLs by mouth at bedtime as needed for cough. 118 mL 0  . sertraline (ZOLOFT) 100 MG tablet Take 100 mg by mouth at bedtime.    . traZODone (DESYREL) 100 MG tablet  Take 100-200 mg by mouth at bedtime as needed for sleep.     No current facility-administered medications on file prior to visit.        Objective:   Physical Exam   Blood pressure 94/60, pulse 64, temperature 98.9 F (37.2 C), height 5\' 2"  (1.575 m), weight 182 lb 3.2 oz (82.645 kg).  Alert and oriented. Skin warm and dry. Oral mucosa is moist.   . Sclera anicteric, conjunctivae is pink. Thyroid not enlarged. No cervical lymphadenopathy. Lungs clear. Heart regular rate and rhythm.  Abdomen is soft. Bowel sounds are positive. No hepatomegaly. No abdominal masses felt. No tenderness.  No edema to lower extremities.           Assessment & Plan:  GERD. Take Omeprazole 40mg  BID.  Dysphagia: EGD/ED. Stricture needs to be ruled out.  Change in stool, Family hx of colon cancer: Colonic neoplasm needs to be ruled out.  Colonoscopy.

## 2015-06-20 NOTE — Telephone Encounter (Signed)
Called patient and left message for them to return call at the office   

## 2015-06-20 NOTE — Telephone Encounter (Signed)
States she is still having a dry cough and the meds she was last sent in is not working for the coughing. Wants something else called in for the cough. Please advise

## 2015-06-21 ENCOUNTER — Other Ambulatory Visit (INDEPENDENT_AMBULATORY_CARE_PROVIDER_SITE_OTHER): Payer: Self-pay | Admitting: *Deleted

## 2015-06-21 ENCOUNTER — Encounter (INDEPENDENT_AMBULATORY_CARE_PROVIDER_SITE_OTHER): Payer: Self-pay | Admitting: *Deleted

## 2015-06-21 ENCOUNTER — Encounter: Payer: Self-pay | Admitting: Family Medicine

## 2015-06-21 ENCOUNTER — Other Ambulatory Visit (INDEPENDENT_AMBULATORY_CARE_PROVIDER_SITE_OTHER): Payer: Self-pay | Admitting: Internal Medicine

## 2015-06-21 DIAGNOSIS — Z800 Family history of malignant neoplasm of digestive organs: Secondary | ICD-10-CM

## 2015-06-21 DIAGNOSIS — R1310 Dysphagia, unspecified: Secondary | ICD-10-CM

## 2015-06-21 DIAGNOSIS — R195 Other fecal abnormalities: Secondary | ICD-10-CM

## 2015-06-21 LAB — CBC
HEMATOCRIT: 38.4 % (ref 35.0–45.0)
Hemoglobin: 13.3 g/dL (ref 11.7–15.5)
MCH: 29.1 pg (ref 27.0–33.0)
MCHC: 34.6 g/dL (ref 32.0–36.0)
MCV: 84 fL (ref 80.0–100.0)
MPV: 9.4 fL (ref 7.5–12.5)
Platelets: 316 10*3/uL (ref 140–400)
RBC: 4.57 MIL/uL (ref 3.80–5.10)
RDW: 14.4 % (ref 11.0–15.0)
WBC: 6.7 10*3/uL (ref 3.8–10.8)

## 2015-06-21 LAB — BASIC METABOLIC PANEL
BUN: 15 mg/dL (ref 7–25)
CALCIUM: 9.5 mg/dL (ref 8.6–10.4)
CO2: 30 mmol/L (ref 20–31)
Chloride: 104 mmol/L (ref 98–110)
Creat: 0.88 mg/dL (ref 0.50–1.05)
Glucose, Bld: 87 mg/dL (ref 65–99)
Potassium: 4.3 mmol/L (ref 3.5–5.3)
SODIUM: 141 mmol/L (ref 135–146)

## 2015-06-21 LAB — LIPID PANEL
CHOL/HDL RATIO: 3.6 ratio (ref <=5.0)
Cholesterol: 191 mg/dL (ref 125–200)
HDL: 53 mg/dL (ref >=46)
LDL CALC: 85 mg/dL (ref <130)
Triglycerides: 266 mg/dL — ABNORMAL HIGH (ref <150)
VLDL: 53 mg/dL — ABNORMAL HIGH (ref <30)

## 2015-06-21 LAB — TSH: TSH: 5.02 m[IU]/L — AB

## 2015-06-21 LAB — T3, FREE: T3, Free: 2.4 pg/mL (ref 2.3–4.2)

## 2015-06-21 LAB — VITAMIN D 25 HYDROXY (VIT D DEFICIENCY, FRACTURES): VIT D 25 HYDROXY: 38 ng/mL (ref 30–100)

## 2015-06-21 LAB — T4, FREE: Free T4: 1.4 ng/dL (ref 0.8–1.8)

## 2015-06-21 NOTE — Telephone Encounter (Addendum)
Noted. Pls see result note on her labs  which I am sending to you

## 2015-06-21 NOTE — Telephone Encounter (Signed)
trilyte

## 2015-06-21 NOTE — Telephone Encounter (Signed)
Patient had labs done 4-3.  She does not want to do additional recommended treatments.  Does not want additional meds sent at this time.

## 2015-06-22 ENCOUNTER — Other Ambulatory Visit: Payer: Self-pay

## 2015-06-22 MED ORDER — PEG 3350-KCL-NA BICARB-NACL 420 G PO SOLR: 4000.0000 mL | Freq: Once | ORAL | Status: DC

## 2015-06-22 NOTE — Addendum Note (Signed)
Addended by: Denman George B on: 06/22/2015 01:19 PM   Modules accepted: Orders

## 2015-06-23 ENCOUNTER — Other Ambulatory Visit: Payer: Self-pay

## 2015-06-23 MED ORDER — LEVOTHYROXINE SODIUM 125 MCG PO TABS: ORAL_TABLET | ORAL | Status: DC

## 2015-06-23 NOTE — Addendum Note (Signed)
Addended by: Denman George B on: 06/23/2015 04:36 PM   Modules accepted: Orders

## 2015-07-07 ENCOUNTER — Other Ambulatory Visit: Payer: Self-pay

## 2015-07-07 MED ORDER — PROMETHAZINE HCL 25 MG PO TABS: 25.0000 mg | ORAL_TABLET | Freq: Every day | ORAL | Status: DC | PRN

## 2015-07-21 NOTE — Patient Instructions (Signed)
[name] Fitzpatrick  07/21/2015     @PREFPERIOPPHARMACY @   Your procedure is scheduled on 07/29/2015.  Report to [facility] at 7:00 A.M.  Call this number if you have problems the morning of surgery:  [phone]   Remember:  Do not eat food or drink liquids after midnight.  Take these medicines the morning of surgery with A SIP OF WATER Valium, Bentyl, Flonase, Gabapentin, Dilaudid if needed, Synthroid, Antivert if needed,  Methadone, Prilosec, Phenergan if needed, Zoloft   Do not wear jewelry, make-up or nail polish.  Do not wear lotions, powders, or perfumes.  You may wear deodorant.  Do not shave 48 hours prior to surgery.  Men may shave face and neck.  Do not bring valuables to the hospital.  [facility] is not responsible for any belongings or valuables.  Contacts, dentures or bridgework may not be worn into surgery.  Leave your suitcase in the car.  After surgery it may be brought to your room.  For patients admitted to the hospital, discharge time will be determined by your treatment team.  Patients discharged the day of surgery will not be allowed to drive home.    Please read over the following fact sheets that you were given. Anesthesia Post-op Instructions     PATIENT INSTRUCTIONS POST-ANESTHESIA  IMMEDIATELY FOLLOWING SURGERY:  Do not drive or operate machinery for the first twenty four hours after surgery.  Do not make any important decisions for twenty four hours after surgery or while taking narcotic pain medications or sedatives.  If you develop intractable nausea and vomiting or a severe headache please notify your doctor immediately.  FOLLOW-UP:  Please make an appointment with your surgeon as instructed. You do not need to follow up with anesthesia unless specifically instructed to do so.  WOUND CARE INSTRUCTIONS (if applicable):  Keep a dry clean dressing on the anesthesia/puncture wound site if there is drainage.  Once the wound has quit draining you  may leave it open to air.  Generally you should leave the bandage intact for twenty four hours unless there is drainage.  If the epidural site drains for more than 36-48 hours please call the anesthesia department.  QUESTIONS?:  Please feel free to call your physician or the hospital operator if you have any questions, and they will be happy to assist you.      Colonoscopy A colonoscopy is an exam to look at the entire large intestine (colon). This exam can help find problems such as tumors, polyps, inflammation, and areas of bleeding. The exam takes about 1 hour.  LET [facility] CARE PROVIDER KNOW ABOUT:   Any allergies you have.  All medicines you are taking, including vitamins, herbs, eye drops, creams, and over-the-counter medicines.  Previous problems you or members of your family have had with the use of anesthetics.  Any blood disorders you have.  Previous surgeries you have had.  Medical conditions you have. RISKS AND COMPLICATIONS  Generally, this is a safe procedure. However, as with any procedure, complications can occur. Possible complications include:  Bleeding.  Tearing or rupture of the colon wall.  Reaction to medicines given during the exam.  Infection (rare). BEFORE THE PROCEDURE   Ask your health care provider about changing or stopping your regular medicines.  You may be prescribed an oral bowel prep. This involves drinking a large amount of medicated liquid, starting the day before your procedure. The liquid will cause you to have multiple loose stools  until your stool is almost clear or light green. This cleans out your colon in preparation for the procedure.  Do not eat or drink anything else once you have started the bowel prep, unless your health care provider tells you it is safe to do so.  Arrange for someone to drive you home after the procedure. PROCEDURE   You will be given medicine to help you relax (sedative).  You will lie on your side with  your knees bent.  A long, flexible tube with a light and camera on the end (colonoscope) will be inserted through the rectum and into the colon. The camera sends video back to a computer screen as it moves through the colon. The colonoscope also releases carbon dioxide gas to inflate the colon. This helps your health care provider see the area better.  During the exam, your health care provider may take a small tissue sample (biopsy) to be examined under a microscope if any abnormalities are found.  The exam is finished when the entire colon has been viewed. AFTER THE PROCEDURE   Do not drive for 24 hours after the exam.  You may have a small amount of blood in your stool.  You may pass moderate amounts of gas and have mild abdominal cramping or bloating. This is caused by the gas used to inflate your colon during the exam.  Ask when your test results will be ready and how you will get your results. Make sure you get your test results.   This information is not intended to replace advice given to you by your health care provider. Make sure you discuss any questions you have with your health care provider.   Document Released: 03/02/2000 Document Revised: 12/24/2012 Document Reviewed: 11/10/2012 Elsevier Interactive Patient Education 2016 [place]. Esophagogastroduodenoscopy Esophagogastroduodenoscopy (EGD) is a procedure that is used to examine the lining of the esophagus, stomach, and first part of the small intestine (duodenum). A long, flexible, lighted tube with a camera attached (endoscope) is inserted down the throat to view these organs. This procedure is done to detect problems or abnormalities, such as inflammation, bleeding, ulcers, or growths, in order to treat them. The procedure lasts 5-20 minutes. It is usually an outpatient procedure, but it may need to be performed in a hospital in emergency cases. LET [facility] CARE PROVIDER KNOW ABOUT:  Any allergies you have.  All  medicines you are taking, including vitamins, herbs, eye drops, creams, and over-the-counter medicines.  Previous problems you or members of your family have had with the use of anesthetics.  Any blood disorders you have.  Previous surgeries you have had.  Medical conditions you have. RISKS AND COMPLICATIONS Generally, this is a safe procedure. However, problems can occur and include:  Infection.  Bleeding.  Tearing (perforation) of the esophagus, stomach, or duodenum.  Difficulty breathing or not being able to breathe.  Excessive sweating.  Spasms of the larynx.  Slowed heartbeat.  Low blood pressure. BEFORE THE PROCEDURE  Do not eat or drink anything after midnight on the night before the procedure or as directed by your health care provider.  Do not take your regular medicines before the procedure if your health care provider asks you not to. Ask your health care provider about changing or stopping those medicines.  If you wear dentures, be prepared to remove them before the procedure.  Arrange for someone to drive you home after the procedure. PROCEDURE  A numbing medicine (local anesthetic) may be sprayed  in your throat for comfort and to stop you from gagging or coughing.  You will have an IV tube inserted in a vein in your hand or arm. You will receive medicines and fluids through this tube.  You will be given a medicine to relax you (sedative).  A pain reliever will be given through the IV tube.  A mouth guard may be placed in your mouth to protect your teeth and to keep you from biting on the endoscope.  You will be asked to lie on your left side.  The endoscope will be inserted down your throat and into your esophagus, stomach, and duodenum.  Air will be put through the endoscope to allow your health care provider to clearly view the lining of your esophagus.  The lining of your esophagus, stomach, and duodenum will be examined. During the exam, your  health care provider may:  Remove tissue to be examined under a microscope (biopsy) for inflammation, infection, or other medical problems.  Remove growths.  Remove objects (foreign bodies) that are stuck.  Treat any bleeding with medicines or other devices that stop tissues from bleeding (hot cautery, clipping devices).  Widen (dilate) or stretch narrowed areas of your esophagus and stomach.  The endoscope will be withdrawn. AFTER THE PROCEDURE  You will be taken to a recovery area for observation. Your blood pressure, heart rate, breathing rate, and blood oxygen level will be monitored often until the medicines you were given have worn off.  Do not eat or drink anything until the numbing medicine has worn off and your gag reflex has returned. You may choke.  Your health care provider should be able to discuss his or her findings with you. It will take longer to discuss the test results if any biopsies were taken.   This information is not intended to replace advice given to you by your health care provider. Make sure you discuss any questions you have with your health care provider.   Document Released: 07/06/2004 Document Revised: 03/26/2014 Document Reviewed: 02/06/2012 Elsevier Interactive Patient Education 2016 [employer] Esophageal Dilatation Esophageal dilatation is a procedure to open a blocked or narrowed part of the esophagus. The esophagus is the long tube in your throat that carries food and liquid from your mouth to your stomach. The procedure is also called esophageal dilation.  You may need this procedure if you have a buildup of scar tissue in your esophagus that makes it difficult, painful, or even impossible to swallow. This can be caused by gastroesophageal reflux disease (GERD). In rare cases, people need this procedure because they have cancer of the esophagus or a problem with the way food moves through the esophagus. Sometimes you may need to have another  dilatation to enlarge the opening of the esophagus gradually. LET [facility] CARE PROVIDER KNOW ABOUT:   Any allergies you have.  All medicines you are taking, including vitamins, herbs, eye drops, creams, and over-the-counter medicines.  Previous problems you or members of your family have had with the use of anesthetics.  Any blood disorders you have.  Previous surgeries you have had.  Medical conditions you have.  Any antibiotic medicines you are required to take before dental procedures. RISKS AND COMPLICATIONS Generally, this is a safe procedure. However, problems can occur and include:  Bleeding from a tear in the lining of the esophagus.  A hole (perforation) in the esophagus. BEFORE THE PROCEDURE  Do not eat or drink anything after midnight on the night  before the procedure or as directed by your health care provider.  Ask your health care provider about changing or stopping your regular medicines. This is especially important if you are taking diabetes medicines or blood thinners.  Plan to have someone take you home after the procedure. PROCEDURE   You will be given a medicine that makes you relaxed and sleepy (sedative).  A medicine may be sprayed or gargled to numb the back of the throat.  Your health care provider can use various instruments to do an esophageal dilatation. During the procedure, the instrument used will be placed in your mouth and passed down into your esophagus. Options include:  Simple dilators. This instrument is carefully placed in the esophagus to stretch it.  Guided wire bougies. In this method, a flexible tube (endoscope) is used to insert a wire into the esophagus. The dilator is passed over this wire to enlarge the esophagus. Then the wire is removed.  Balloon dilators. An endoscope with a small balloon at the end is passed down into the esophagus. Inflating the balloon gently stretches the esophagus and opens it up. AFTER THE  PROCEDURE  Your blood pressure, heart rate, breathing rate, and blood oxygen level will be monitored often until the medicines you were given have worn off.  Your throat may feel slightly sore and will probably still feel numb. This will improve slowly over time.  You will not be allowed to eat or drink until the throat numbness has resolved.  If this is a same-day procedure, you may be allowed to go home once you have been able to drink, urinate, and sit on the edge of the bed without nausea or dizziness.  If this is a same-day procedure, you should have a friend or family member with you for the next 24 hours after the procedure.   This information is not intended to replace advice given to you by your health care provider. Make sure you discuss any questions you have with your health care provider.   Document Released: 04/26/2005 Document Revised: 03/26/2014 Document Reviewed: 07/15/2013 Elsevier Interactive Patient Education [employer].

## 2015-07-25 ENCOUNTER — Other Ambulatory Visit: Payer: Self-pay

## 2015-07-25 ENCOUNTER — Encounter (HOSPITAL_COMMUNITY): Payer: Self-pay

## 2015-07-25 ENCOUNTER — Encounter (HOSPITAL_COMMUNITY)
Admission: RE | Admit: 2015-07-25 | Discharge: 2015-07-25 | Disposition: A | Discharge status: Home / Self Care | Payer: Medicare HMO | Source: Ambulatory Visit | Attending: Internal Medicine | Admitting: Internal Medicine

## 2015-07-25 VITALS — BP 112/71 | HR 74 | Temp 98.9°F | Resp 18 | Ht 62.0 in | Wt 185.0 lb

## 2015-07-25 DIAGNOSIS — R195 Other fecal abnormalities: Secondary | ICD-10-CM

## 2015-07-25 DIAGNOSIS — Z01812 Encounter for preprocedural laboratory examination: Secondary | ICD-10-CM | POA: Insufficient documentation

## 2015-07-25 DIAGNOSIS — Z01810 Encounter for preprocedural cardiovascular examination: Secondary | ICD-10-CM | POA: Diagnosis present

## 2015-07-25 DIAGNOSIS — Z800 Family history of malignant neoplasm of digestive organs: Secondary | ICD-10-CM

## 2015-07-25 DIAGNOSIS — R1310 Dysphagia, unspecified: Secondary | ICD-10-CM

## 2015-07-25 HISTORY — DX: Autoimmune thyroiditis: E06.3

## 2015-07-25 HISTORY — DX: Gastro-esophageal reflux disease without esophagitis: K21.9

## 2015-07-25 LAB — CBC WITH DIFFERENTIAL/PLATELET
BASOS ABS: 0 10*3/uL (ref 0.0–0.1)
Basophils Relative: 1 %
EOS PCT: 4 %
Eosinophils Absolute: 0.2 10*3/uL (ref 0.0–0.7)
HEMATOCRIT: 37.2 % (ref 36.0–46.0)
Hemoglobin: 12.5 g/dL (ref 12.0–15.0)
LYMPHS ABS: 1.8 10*3/uL (ref 0.7–4.0)
LYMPHS PCT: 27 %
MCH: 28.7 pg (ref 26.0–34.0)
MCHC: 33.6 g/dL (ref 30.0–36.0)
MCV: 85.5 fL (ref 78.0–100.0)
MONO ABS: 0.4 10*3/uL (ref 0.1–1.0)
MONOS PCT: 6 %
NEUTROS ABS: 4 10*3/uL (ref 1.7–7.7)
Neutrophils Relative %: 62 %
PLATELETS: 207 10*3/uL (ref 150–400)
RBC: 4.35 MIL/uL (ref 3.87–5.11)
RDW: 13.9 % (ref 11.5–15.5)
WBC: 6.5 10*3/uL (ref 4.0–10.5)

## 2015-07-25 LAB — BASIC METABOLIC PANEL
Anion gap: 9 (ref 5–15)
BUN: 15 mg/dL (ref 6–20)
CALCIUM: 9.1 mg/dL (ref 8.9–10.3)
CO2: 21 mmol/L — AB (ref 22–32)
CREATININE: 0.78 mg/dL (ref 0.44–1.00)
Chloride: 106 mmol/L (ref 101–111)
GFR calc Af Amer: >60 mL/min (ref >60)
GLUCOSE: 89 mg/dL (ref 65–99)
Potassium: 3.7 mmol/L (ref 3.5–5.1)
Sodium: 136 mmol/L (ref 135–145)

## 2015-07-25 NOTE — Pre-Procedure Instructions (Signed)
Dr Milford Cage aware of Hashimoto's thyroiditis as well as recent TSH and medication regimen. No orders given. Ok to proceed with surgery.

## 2015-07-29 ENCOUNTER — Encounter (HOSPITAL_COMMUNITY)
Admission: RE | Disposition: A | Discharge status: Home / Self Care | Payer: Self-pay | Source: Ambulatory Visit | Attending: Internal Medicine

## 2015-07-29 ENCOUNTER — Ambulatory Visit (HOSPITAL_COMMUNITY)
Admission: RE | Admit: 2015-07-29 | Discharge: 2015-07-29 | Disposition: A | Discharge status: Home / Self Care | Payer: Medicare HMO | Source: Ambulatory Visit | Attending: Internal Medicine | Admitting: Internal Medicine

## 2015-07-29 ENCOUNTER — Encounter (HOSPITAL_COMMUNITY): Payer: Self-pay | Admitting: *Deleted

## 2015-07-29 ENCOUNTER — Ambulatory Visit (HOSPITAL_COMMUNITY): Payer: Medicare HMO | Admitting: Anesthesiology

## 2015-07-29 DIAGNOSIS — E063 Autoimmune thyroiditis: Secondary | ICD-10-CM | POA: Diagnosis not present

## 2015-07-29 DIAGNOSIS — K210 Gastro-esophageal reflux disease with esophagitis: Secondary | ICD-10-CM | POA: Diagnosis not present

## 2015-07-29 DIAGNOSIS — E119 Type 2 diabetes mellitus without complications: Secondary | ICD-10-CM | POA: Diagnosis not present

## 2015-07-29 DIAGNOSIS — R194 Change in bowel habit: Secondary | ICD-10-CM | POA: Diagnosis not present

## 2015-07-29 DIAGNOSIS — K2950 Unspecified chronic gastritis without bleeding: Secondary | ICD-10-CM | POA: Diagnosis not present

## 2015-07-29 DIAGNOSIS — F419 Anxiety disorder, unspecified: Secondary | ICD-10-CM | POA: Diagnosis not present

## 2015-07-29 DIAGNOSIS — Z7982 Long term (current) use of aspirin: Secondary | ICD-10-CM | POA: Insufficient documentation

## 2015-07-29 DIAGNOSIS — E039 Hypothyroidism, unspecified: Secondary | ICD-10-CM | POA: Insufficient documentation

## 2015-07-29 DIAGNOSIS — K449 Diaphragmatic hernia without obstruction or gangrene: Secondary | ICD-10-CM | POA: Diagnosis not present

## 2015-07-29 DIAGNOSIS — K219 Gastro-esophageal reflux disease without esophagitis: Secondary | ICD-10-CM | POA: Diagnosis not present

## 2015-07-29 DIAGNOSIS — Z808 Family history of malignant neoplasm of other organs or systems: Secondary | ICD-10-CM | POA: Diagnosis not present

## 2015-07-29 DIAGNOSIS — F329 Major depressive disorder, single episode, unspecified: Secondary | ICD-10-CM | POA: Insufficient documentation

## 2015-07-29 DIAGNOSIS — K2960 Other gastritis without bleeding: Secondary | ICD-10-CM | POA: Diagnosis not present

## 2015-07-29 DIAGNOSIS — Z800 Family history of malignant neoplasm of digestive organs: Secondary | ICD-10-CM

## 2015-07-29 DIAGNOSIS — Z87891 Personal history of nicotine dependence: Secondary | ICD-10-CM | POA: Diagnosis not present

## 2015-07-29 DIAGNOSIS — M797 Fibromyalgia: Secondary | ICD-10-CM | POA: Diagnosis not present

## 2015-07-29 DIAGNOSIS — J449 Chronic obstructive pulmonary disease, unspecified: Secondary | ICD-10-CM | POA: Insufficient documentation

## 2015-07-29 DIAGNOSIS — R195 Other fecal abnormalities: Secondary | ICD-10-CM

## 2015-07-29 DIAGNOSIS — R1314 Dysphagia, pharyngoesophageal phase: Secondary | ICD-10-CM | POA: Diagnosis not present

## 2015-07-29 DIAGNOSIS — Z79899 Other long term (current) drug therapy: Secondary | ICD-10-CM | POA: Diagnosis not present

## 2015-07-29 DIAGNOSIS — Z7951 Long term (current) use of inhaled steroids: Secondary | ICD-10-CM | POA: Diagnosis not present

## 2015-07-29 DIAGNOSIS — R1310 Dysphagia, unspecified: Secondary | ICD-10-CM | POA: Diagnosis not present

## 2015-07-29 DIAGNOSIS — I10 Essential (primary) hypertension: Secondary | ICD-10-CM | POA: Insufficient documentation

## 2015-07-29 HISTORY — PX: BIOPSY: SHX5522

## 2015-07-29 HISTORY — PX: ESOPHAGEAL DILATION: SHX303

## 2015-07-29 HISTORY — PX: ESOPHAGOGASTRODUODENOSCOPY (EGD) WITH PROPOFOL: SHX5813

## 2015-07-29 HISTORY — PX: COLONOSCOPY WITH PROPOFOL: SHX5780

## 2015-07-29 LAB — GLUCOSE, CAPILLARY
GLUCOSE-CAPILLARY: 91 mg/dL (ref 65–99)
Glucose-Capillary: 92 mg/dL (ref 65–99)

## 2015-07-29 SURGERY — COLONOSCOPY WITH PROPOFOL
Anesthesia: Monitor Anesthesia Care

## 2015-07-29 MED ORDER — BUTAMBEN-TETRACAINE-BENZOCAINE 2-2-14 % EX AERO
2.0000 | INHALATION_SPRAY | Freq: Two times a day (BID) | CUTANEOUS | Status: AC
  Administered 2015-07-29 (×2): 2 via TOPICAL

## 2015-07-29 MED ORDER — FENTANYL CITRATE (PF) 100 MCG/2ML IJ SOLN
25.0000 ug | INTRAMUSCULAR | Status: DC | PRN
Start: 2015-07-29 — End: 2015-07-29

## 2015-07-29 MED ORDER — ONDANSETRON 4 MG PO TBDP
ORAL_TABLET | ORAL | Status: AC
  Filled 2015-07-29: qty 2

## 2015-07-29 MED ORDER — PROPOFOL 10 MG/ML IV BOLUS
INTRAVENOUS | Status: AC
  Filled 2015-07-29: qty 40

## 2015-07-29 MED ORDER — LACTATED RINGERS IV SOLN
INTRAVENOUS | Status: DC
  Administered 2015-07-29: 1000 mL via INTRAVENOUS

## 2015-07-29 MED ORDER — MIDAZOLAM HCL 2 MG/2ML IJ SOLN
1.0000 mg | INTRAMUSCULAR | Status: DC | PRN
  Administered 2015-07-29: 2 mg via INTRAVENOUS

## 2015-07-29 MED ORDER — SUCRALFATE 1 GM/10ML PO SUSP: 1.0000 g | Freq: Three times a day (TID) | ORAL | Status: DC

## 2015-07-29 MED ORDER — ONDANSETRON HCL 8 MG PO TABS: 8.0000 mg | ORAL_TABLET | Freq: Once | ORAL | Status: DC

## 2015-07-29 MED ORDER — ONDANSETRON HCL 4 MG/2ML IJ SOLN
INTRAMUSCULAR | Status: AC
  Filled 2015-07-29: qty 2

## 2015-07-29 MED ORDER — ONDANSETRON 4 MG PO TBDP
8.0000 mg | ORAL_TABLET | Freq: Once | ORAL | Status: AC
  Administered 2015-07-29: 8 mg via ORAL

## 2015-07-29 MED ORDER — PROPOFOL 10 MG/ML IV BOLUS
INTRAVENOUS | Status: DC | PRN
  Administered 2015-07-29: 10 mg via INTRAVENOUS

## 2015-07-29 MED ORDER — MIDAZOLAM HCL 2 MG/2ML IJ SOLN
INTRAMUSCULAR | Status: AC
  Filled 2015-07-29: qty 2

## 2015-07-29 MED ORDER — FENTANYL CITRATE (PF) 100 MCG/2ML IJ SOLN
INTRAMUSCULAR | Status: AC
  Filled 2015-07-29: qty 2

## 2015-07-29 MED ORDER — FENTANYL CITRATE (PF) 100 MCG/2ML IJ SOLN
INTRAMUSCULAR | Status: DC | PRN
Start: 2015-07-29 — End: 2015-07-29
  Administered 2015-07-29 (×2): 25 ug via INTRAVENOUS

## 2015-07-29 MED ORDER — PROPOFOL 500 MG/50ML IV EMUL
INTRAVENOUS | Status: DC | PRN
  Administered 2015-07-29 (×2): via INTRAVENOUS
  Administered 2015-07-29: 125 ug/kg/min via INTRAVENOUS

## 2015-07-29 MED ORDER — ONDANSETRON HCL 4 MG/2ML IJ SOLN: 4.0000 mg | Freq: Once | INTRAMUSCULAR | Status: DC | PRN

## 2015-07-29 MED ORDER — ONDANSETRON HCL 4 MG/2ML IJ SOLN
4.0000 mg | Freq: Once | INTRAMUSCULAR | Status: AC
  Administered 2015-07-29: 4 mg via INTRAVENOUS

## 2015-07-29 NOTE — H&P (Signed)
[name] Fitzpatrick is an 60 y.o. female.   Chief Complaint: Patient is here for EGD ED and colonoscopy. HPI: This 60 year old Caucasian female with multiple medical problems who presents with 2 year history of intermittent solid food dysphagia. She points to lower sternum area site of bolus obstruction. She says occasionally she has difficulty with water. She says heartburns well controlled with PPI. She's had heartburn for more than 10 years. She states she has lost 35 pounds over unspecified period. She is not trying to lose weight. She wonders if it's related to thyroid problems. She also complains of irregular bowel movements. There is been a change in shape of her stool. She denies melena or rectal bleeding. She also complains of left upper quadrant pain which she describes as knifelike or pinpricks. This pain is not associated with diarrhea or urgency. Family's 3 significant for colon carcinoma in maternal grandmother and later years. Family history significant for esophageal cancer in paternal grandmother. Her father died of pancreatic carcinoma at age 45 within 74 months of diagnosis. Last colonoscopy was over 10 years ago.  Past Medical History  Diagnosis Date  . Anxiety   . Depression   . Hypothyroidism   . Osteoarthritis   . IBS (irritable bowel syndrome)     with constipation  . Fibromyalgia   . Chronic LBP   . Migraine headache   . Chronic neck pain   . DM type 2 (diabetes mellitus, type 2) ([place])   . Hypertension   . COPD (chronic obstructive pulmonary disease) ([place])   . H/O suicide attempt   . 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 GERD (gastroesophageal reflux disease)   . Hashimoto's thyroiditis     Past Surgical History  Procedure Laterality Date  . Reconstucted right ankle    . C-spine fusion  1991, 2004  . Vesicovaginal fistula closure w/ tah  1993    Fibroids no Ca  . Right knee arthroscopic surgery    . B/l foot  surgery --bunions    . L carpal tunnel release Right   . L spine fusion x 2    . Total reconstruction of right ankle and heel      total of three   . Abdominal hysterectomy    . Cholecystectomy      Family History  Problem Relation Age of Onset  . Cancer Father     pancreatic   . Pancreatic cancer Father   . Heart failure Mother   . GI problems Mother   . Heart disease Mother   . Diabetes Mother   . Thyroid disease Sister   . Cancer Maternal Grandfather   . Cancer      family history    Social History:  reports that she quit smoking about 3 years ago. Her smoking use included Cigarettes. She has a 12 pack-year smoking history. She does not have any smokeless tobacco history on file. She reports that she uses illicit drugs. She reports that she does not drink alcohol.  Allergies:  Allergies  Allergen Reactions  . Prednisone Shortness Of Breath and Nausea And Vomiting  . Penicillins Other (See Comments)    Stroke-like symptoms Has patient had a PCN reaction causing immediate rash, facial/tongue/throat swelling, SOB or lightheadedness with hypotension: YES Has patient had a PCN reaction causing severe rash involving mucus membranes or skin necrosis: No Has patient had a PCN reaction that required hospitalization: No Has patient had a PCN  reaction occurring within the last 10 years: Yes If all of the above answers are "NO", then may proceed with Cephalosporin use.   . Cephalexin Hives, Swelling and Rash  . Latex Rash and Other (See Comments)    Causes skin to tear easily  . Metoclopramide Hcl Hives and Rash    Medications Prior to Admission  Medication Sig Dispense Refill  . aspirin EC 81 MG tablet Take 81 mg by mouth daily.    . diazepam (VALIUM) 5 MG tablet Take 1 tablet by mouth twice daily as needed for anxiety.  2  . dicyclomine (BENTYL) 10 MG capsule TAKE 1 CAPSULE BY MOUTH TWICE DAILY. 60 capsule 3  . ESTRACE VAGINAL 0.1 MG/GM vaginal cream INSERT 1 GRAM VAGINALLY  ONCE DAILY AS DIRECTED  10  . fluticasone (FLONASE) 50 MCG/ACT nasal spray 2 SPRAYS EACH NOSTRIL ONCE DAILY. 16 g 3  . gabapentin (NEURONTIN) 300 MG capsule Take 2 capsules by mouth every 8 hours.  1  . HYDROmorphone (DILAUDID) 4 MG tablet Take 1 tablet by mouth every 4 hours as needed for breakthrough pain.  0  . levothyroxine (SYNTHROID, LEVOTHROID) 125 MCG tablet TAKE 2 TABLETS(224 MCG) BY MOUTH DAILY BEFORE BREAKFAST (Patient taking differently: Take 250 mcg by mouth daily before breakfast. ) 60 tablet 2  . lidocaine (LIDODERM) 5 % Place 1 patch onto the skin daily.     . methadone (DOLOPHINE) 10 MG tablet Take 1.5 tablets (15 mg total) by mouth 3 (three) times daily. (Patient taking differently: Take 10-15 mg by mouth 3 (three) times daily. Takes 1-1.5 tablets depending on her pain level.) 15 tablet 0  . omeprazole (PRILOSEC) 40 MG capsule Take 1 capsule (40 mg total) by mouth daily. 30 capsule 3  . polyethylene glycol (MIRALAX / GLYCOLAX) packet Take 17 g by mouth daily.    . polyethylene glycol-electrolytes (NULYTELY/GOLYTELY) 420 g solution Take 4,000 mLs by mouth once. 4000 mL 0  . promethazine (PHENERGAN) 25 MG tablet Take 1 tablet (25 mg total) by mouth daily as needed for nausea or vomiting. 30 tablet 5  . sertraline (ZOLOFT) 100 MG tablet Take 100 mg by mouth at bedtime.    . traZODone (DESYREL) 100 MG tablet Take 100-200 mg by mouth at bedtime as needed for sleep.    Marland Kitchen ibuprofen (ADVIL,MOTRIN) 600 MG tablet One tablet 3 times daily for 1 week (Patient not taking: Reported on 07/25/2015) 21 tablet 0  . magic mouthwash SOLN Take 5 mLs by mouth 4 (four) times daily as needed for mouth pain. (Patient not taking: Reported on 07/25/2015) 237 mL 0  . meclizine (ANTIVERT) 12.5 MG tablet Take 1 tablet (12.5 mg total) by mouth 3 (three) times daily as needed for dizziness. 30 tablet 1  . promethazine-dextromethorphan (PROMETHAZINE-DM) 6.25-15 MG/5ML syrup Take 5 mLs by mouth at bedtime as needed for  cough. (Patient not taking: Reported on 07/25/2015) 118 mL 0    No results found for this or any previous visit (from the past 48 hour(s)). No results found.  ROS  Blood pressure 102/71, pulse 77, temperature 98.3 F (36.8 C), temperature source Oral, resp. rate 14, height 5\' 2"  (1.575 m), weight 185 lb (83.915 kg), SpO2 95 %. Physical Exam  Constitutional: She appears well-developed and well-nourished.  HENT:  Mouth/Throat: Oropharynx is clear and moist. No oropharyngeal exudate.  Eyes: Conjunctivae are normal. No scleral icterus.  Neck: No thyromegaly present.  Cardiovascular: Normal rate, regular rhythm and normal heart sounds.   No  murmur heard. Respiratory: Effort normal and breath sounds normal.  GI: Soft. She exhibits no distension and no mass. There is no tenderness.  Musculoskeletal: She exhibits no edema.  Lymphadenopathy:    She has no cervical adenopathy.  Neurological: She is alert.  Skin: Skin is warm and dry.     Assessment/Plan Esophageal dysphagia in a patient with chronic GERD. Change in bowel habits. Family history of CRC in second-degree relative at late onset. EGD with ED and colonoscopy under monitored anesthesia care.  Rogene Houston, MD 07/29/2015, 8:14 AM

## 2015-07-29 NOTE — Op Note (Signed)
[facility] Patient Name: [name] Fitzpatrick Procedure Date: 07/29/2015 8:34 AM MRN: [id number] Date of Birth: Jan 15, 1956 Attending MD: Hildred Laser , MD CSN: [id number] Age: 60 Admit Type: Outpatient Procedure:                Upper GI endoscopy Indications:              Esophageal dysphagia, Gastro-esophageal reflux                            disease Providers:                Hildred Laser, MD, Lurline Del, RN, Georgeann Oppenheim,                            Technician Referring MD:             Norwood Levo. Moshe Cipro, MD Medicines:                Propofol per Anesthesia Complications:            No immediate complications. Estimated Blood Loss:     Estimated blood loss was minimal. Procedure:                Pre-Anesthesia Assessment:                           - Prior to the procedure, a History and Physical                            was performed, and patient medications and                            allergies were reviewed. The patient's tolerance of                            previous anesthesia was also reviewed. The risks                            and benefits of the procedure and the sedation                            options and risks were discussed with the patient.                            All questions were answered, and informed consent                            was obtained. Prior Anticoagulants: The patient                            last took aspirin 7 days prior to the procedure.                            ASA Grade Assessment: III - A patient with severe  systemic disease. After reviewing the risks and                            benefits, the patient was deemed in satisfactory                            condition to undergo the procedure.                           After obtaining informed consent, the endoscope was                            passed under direct vision. Throughout the                            procedure, the patient's blood  pressure, pulse, and                            oxygen saturations were monitored continuously. The                            [id number]) scope was introduced through the                            mouth, and advanced to the second part of duodenum.                            The upper GI endoscopy was accomplished without                            difficulty. The patient tolerated the procedure                            well. Scope In: 8:39:32 AM Scope Out: 8:55:35 AM Total Procedure Duration: 0 hours 16 minutes 3 seconds  Findings:      The examined esophagus was normal.      The Z-line was regular and was found 34 cm from the incisors.      A 2 cm hiatal hernia was present.      No endoscopic abnormality was evident in the esophagus to explain the       patient's complaint of dysphagia. It was decided, however, to proceed       with dilation of the entire esophagus. The scope was withdrawn. Dilation       was performed with a Maloney dilator with mild resistance at 66 Fr. The       dilation site was examined and showed mild improvement in luminal       narrowingin mucosal disruption at mid and proximal esophagus.      Multiple biopsies taken from esophageal body.      Diffuse mild inflammation characterized by congestion (edema) and       granularity was found in the gastric antrum. Biopsies were taken with a       cold forceps for histology.      The [Site] was normal. Biopsies were taken with a cold forceps for  histology.      The exam of the stomach was otherwise normal.      The duodenal bulb and second portion of the duodenum were normal.       Biopsies for histology were taken with a cold forceps for evaluation of       celiac disease. Impression:               - Normal esophagus.                           - Z-line regular, 34 cm from the incisors.                           - 2 cm hiatal hernia.                           - No endoscopic esophageal abnormality  to explain                            patient's dysphagia. Esophagus dilated. Dilated.                           - Chronic non-erosive gastritis. Biopsied.                           - Normal duodenal bulb and second portion of the                            duodenum. Biopsied.                           - Normal second portion of the duodenum. Biopsied. Moderate Sedation:      Per Anesthesia Care      Per Anesthesia Care Recommendation:           - Patient has a contact number available for                            emergencies. The signs and symptoms of potential                            delayed complications were discussed with the                            patient. Return to normal activities tomorrow.                            Written discharge instructions were provided to the                            patient.                           - Full liquid diet for 2 days.                           - Continue present medications.                           -  Resume aspirin at prior dose in 3 days.                           - Await pathology results.                           - Use sucralfate suspension 1 gram PO QID. Procedure Code(s):        --- Professional ---                           [phone], Esophagogastroduodenoscopy, flexible,                            transoral; with biopsy, single or multiple                           43450, Dilation of esophagus, by unguided sound or                            bougie, single or multiple passes Diagnosis Code(s):        --- Professional ---                           K44.9, Diaphragmatic hernia without obstruction or                            gangrene                           K29.50, Unspecified chronic gastritis without                            bleeding                           K29.60, Other gastritis without bleeding                           R13.14, Dysphagia, pharyngoesophageal phase                           K21.9,  Gastro-esophageal reflux disease without                            esophagitis CPT copyright 2016 American Medical Association. All rights reserved. The codes documented in this report are preliminary and upon coder review may  be revised to meet current compliance requirements. Hildred Laser, MD Hildred Laser, MD 07/29/2015 9:43:58 AM This report has been signed electronically. Number of Addenda: 0

## 2015-07-29 NOTE — Op Note (Signed)
[facility] Patient Name: [name] Fitzpatrick Procedure Date: 07/29/2015 9:00 AM MRN: [id number] Date of Birth: 1956/03/07 Attending MD: Hildred Laser , MD CSN: [id number] Age: 60 Admit Type: Outpatient Procedure:                Colonoscopy Indications:              Change in bowel habits Providers:                Hildred Laser, MD, Lurline Del, RN, Georgeann Oppenheim,                            Technician Referring MD:             Norwood Levo. Moshe Cipro, MD Medicines:                Propofol per Anesthesia Complications:            No immediate complications. Estimated Blood Loss:     Estimated blood loss: none. Procedure:                Pre-Anesthesia Assessment:                           - Prior to the procedure, a History and Physical                            was performed, and patient medications and                            allergies were reviewed. The patient's tolerance of                            previous anesthesia was also reviewed. The risks                            and benefits of the procedure and the sedation                            options and risks were discussed with the patient.                            All questions were answered, and informed consent                            was obtained. Prior Anticoagulants: The patient                            last took aspirin 7 days prior to the procedure.                            ASA Grade Assessment: III - A patient with severe                            systemic disease. After reviewing the risks and  benefits, the patient was deemed in satisfactory                            condition to undergo the procedure.                           - Prior to the procedure, a History and Physical                            was performed, and patient medications and                            allergies were reviewed. The patient's tolerance of                            previous anesthesia was  also reviewed. The risks                            and benefits of the procedure and the sedation                            options and risks were discussed with the patient.                            All questions were answered, and informed consent                            was obtained. Prior Anticoagulants: The patient                            last took aspirin 7 days prior to the procedure.                            ASA Grade Assessment: III - A patient with severe                            systemic disease. After reviewing the risks and                            benefits, the patient was deemed in satisfactory                            condition to undergo the procedure.                           After obtaining informed consent, the colonoscope                            was passed under direct vision. Throughout the                            procedure, the patient's blood pressure, pulse, and  oxygen saturations were monitored continuously. The                            [id number]) scope was introduced through                            the anus and advanced to the the cecum, identified                            by appendiceal orifice and ileocecal valve. The                            colonoscopy was somewhat difficult due to a                            tortuous colon. Successful completion of the                            procedure was aided by changing the patient to a                            supine position. The patient tolerated the                            procedure well. The quality of the bowel                            preparation was excellent. The ileocecal valve,                            appendiceal orifice, and rectum were photographed. Scope In: 9:04:50 AM Scope Out: [id number] AM Scope Withdrawal Time: 0 hours 6 minutes 53 seconds  Total Procedure Duration: 0 hours 31 minutes 29 seconds  Findings:      The  colon (entire examined portion) appeared normal.      The retroflexed view of the distal rectum and anal verge was normal and       showed no anal or rectal abnormalities. Impression:               - The entire examined colon is normal.                           - No specimens collected. Moderate Sedation:      Per Anesthesia Care      Per Anesthesia Care Recommendation:           - Patient has a contact number available for                            emergencies. The signs and symptoms of potential                            delayed complications were discussed with the  patient. Return to normal activities tomorrow.                            Written discharge instructions were provided to the                            patient.                           - Full liquid diet for 2 days.                           - Resume previous diet.                           - Repeat colonoscopy in 10 years for screening                            purposes.                           - Use sugar-free Metamucil one tablespoon PO daily                            indefinitely. Procedure Code(s):        --- Professional ---                           [phone], Colonoscopy, flexible; diagnostic, including                            collection of specimen(s) by brushing or washing,                            when performed (separate procedure) Diagnosis Code(s):        --- Professional ---                           R19.4, Change in bowel habit CPT copyright 2016 American Medical Association. All rights reserved. The codes documented in this report are preliminary and upon coder review may  be revised to meet current compliance requirements. Hildred Laser, MD Hildred Laser, MD 07/29/2015 9:48:46 AM This report has been signed electronically. Number of Addenda: 0

## 2015-07-29 NOTE — Anesthesia Preprocedure Evaluation (Signed)
Anesthesia Evaluation  Patient identified by MRN, date of birth, ID band Patient awake    Reviewed: Allergy & Precautions, NPO status , Patient's Chart, lab work & pertinent test results  Airway Mallampati: II       Dental  (+) Edentulous Upper, Edentulous Lower   Pulmonary shortness of breath and with exertion, COPD,  COPD inhaler, former smoker,    Pulmonary exam normal        Cardiovascular hypertension, Normal cardiovascular exam     Neuro/Psych  Headaches, Anxiety Depression  Neuromuscular disease    GI/Hepatic   Endo/Other  diabetes, Poorly Controlled, Type 2, Oral Hypoglycemic AgentsHypothyroidism   Renal/GU      Musculoskeletal  (+) Arthritis , Osteoarthritis,  Fibromyalgia -, narcotic dependent  Abdominal (+) + obese,   Peds  Hematology   Anesthesia Other Findings   Reproductive/Obstetrics                             Anesthesia Physical Anesthesia Plan  ASA: III  Anesthesia Plan: MAC   Post-op Pain Management:    Induction: Intravenous  Airway Management Planned: Nasal Cannula  Additional Equipment:   Intra-op Plan:   Post-operative Plan:   Informed Consent: I have reviewed the patients History and Physical, chart, labs and discussed the procedure including the risks, benefits and alternatives for the proposed anesthesia with the patient or authorized representative who has indicated his/her understanding and acceptance.     Plan Discussed with: CRNA  Anesthesia Plan Comments:         Anesthesia Quick Evaluation

## 2015-07-29 NOTE — Anesthesia Postprocedure Evaluation (Signed)
Anesthesia Post Note  Patient: [name] Fitzpatrick  Procedure(s) Performed: Procedure(s) (LRB): COLONOSCOPY WITH PROPOFOL (N/A) ESOPHAGOGASTRODUODENOSCOPY (EGD) WITH PROPOFOL (N/A) ESOPHAGEAL DILATION (N/A) BIOPSY  Patient location during evaluation: PACU Anesthesia Type: General Level of consciousness: awake and alert Pain management: pain level controlled Vital Signs Assessment: post-procedure vital signs reviewed and stable Respiratory status: spontaneous breathing Cardiovascular status: blood pressure returned to baseline Postop Assessment: no signs of nausea or vomiting Anesthetic complications: no    Last Vitals:  Filed Vitals:   07/29/15 1000 07/29/15 1015  BP: 95/61 88/53  Pulse: 103 69  Temp:    Resp: 24 20    Last Pain: There were no vitals filed for this visit.               Janaisha Tolsma

## 2015-07-29 NOTE — Discharge Instructions (Signed)
NO ASPIRIN OR NSAIDS FOR THE NEXT 3 DAYS.    FULL LIQUID DIET FOR THE NEXT 24 HOURS.   NO DRIVING FOR 24 HOURS.   Esophagogastroduodenoscopy, Care After Refer to this sheet in the next few weeks. These instructions provide you with information about caring for yourself after your procedure. Your health care provider may also give you more specific instructions. Your treatment has been planned according to current medical practices, but problems sometimes occur. Call your health care provider if you have any problems or questions after your procedure. WHAT TO EXPECT AFTER THE PROCEDURE After your procedure, it is typical to feel:  Soreness in your throat.  Pain with swallowing.  Sick to your stomach (nauseous).  Bloated.  Dizzy.  Fatigued. HOME CARE INSTRUCTIONS  Do not eat or drink anything until the numbing medicine (local anesthetic) has worn off and your gag reflex has returned. You will know that the local anesthetic has worn off when you can swallow comfortably.  Do not drive or operate machinery until directed by your health care provider.  Take medicines only as directed by your health care provider. SEEK MEDICAL CARE IF:   You cannot stop coughing.  You are not urinating at all or less than usual. SEEK IMMEDIATE MEDICAL CARE IF:  You have difficulty swallowing.  You cannot eat or drink.  You have worsening throat or chest pain.  You have dizziness or lightheadedness or you faint.  You have nausea or vomiting.  You have chills.  You have a fever.  You have severe abdominal pain.  You have black, tarry, or bloody stools.   This information is not intended to replace advice given to you by your health care provider. Make sure you discuss any questions you have with your health care provider.   Document Released: 02/20/2012 Document Revised: 03/26/2014 Document Reviewed: 02/20/2012 Elsevier Interactive Patient Education 2016 [employer] Colonoscopy,  Care After Refer to this sheet in the next few weeks. These instructions provide you with information on caring for yourself after your procedure. Your health care provider may also give you more specific instructions. Your treatment has been planned according to current medical practices, but problems sometimes occur. Call your health care provider if you have any problems or questions after your procedure. WHAT TO EXPECT AFTER THE PROCEDURE  After your procedure, it is typical to have the following:  A small amount of blood in your stool.  Moderate amounts of gas and mild abdominal cramping or bloating. HOME CARE INSTRUCTIONS  Do not drive, operate machinery, or sign important documents for 24 hours.  You may shower and resume your regular physical activities, but move at a slower pace for the first 24 hours.  Take frequent rest periods for the first 24 hours.  Walk around or put a warm pack on your abdomen to help reduce abdominal cramping and bloating.  Drink enough fluids to keep your urine clear or pale yellow.  You may resume your normal diet as instructed by your health care provider. Avoid heavy or fried foods that are hard to digest.  Avoid drinking alcohol for 24 hours or as instructed by your health care provider.  Only take over-the-counter or prescription medicines as directed by your health care provider.  If a tissue sample (biopsy) was taken during your procedure:  Do not take aspirin or blood thinners for 7 days, or as instructed by your health care provider.  Do not drink alcohol for 7 days, or as instructed  by your health care provider.  Eat soft foods for the first 24 hours. SEEK MEDICAL CARE IF: You have persistent spotting of blood in your stool 2-3 days after the procedure. SEEK IMMEDIATE MEDICAL CARE IF:  You have more than a small spotting of blood in your stool.  You pass large blood clots in your stool.  Your abdomen is swollen (distended).  You  have nausea or vomiting.  You have a fever.  You have increasing abdominal pain that is not relieved with medicine.   This information is not intended to replace advice given to you by your health care provider. Make sure you discuss any questions you have with your health care provider.   Document Released: 10/18/2003 Document Revised: 12/24/2012 Document Reviewed: 11/10/2012 Elsevier Interactive Patient Education [employer].

## 2015-07-29 NOTE — Transfer of Care (Signed)
Immediate Anesthesia Transfer of Care Note  Patient: [name] Fitzpatrick  Procedure(s) Performed: Procedure(s) with comments: COLONOSCOPY WITH PROPOFOL (N/A) - 8:30 ESOPHAGOGASTRODUODENOSCOPY (EGD) WITH PROPOFOL (N/A) ESOPHAGEAL DILATION (N/A) BIOPSY - Duodenal,  gastric, and esophageal  biopsies  Patient Location: PACU  Anesthesia Type:MAC  Level of Consciousness: awake, alert  and oriented  Airway & Oxygen Therapy: Patient Spontanous Breathing and Patient connected to nasal cannula oxygen  Post-op Assessment: Report given to RN  Post vital signs: Reviewed and stable  Last Vitals:  Filed Vitals:   07/29/15 0748  BP: 102/71  Pulse: 77  Temp: 36.8 C  Resp: 14    Last Pain: There were no vitals filed for this visit.    Patients Stated Pain Goal: 7 ([id number])  Complications: No apparent anesthesia complications

## 2015-08-02 ENCOUNTER — Encounter (HOSPITAL_COMMUNITY): Payer: Self-pay | Admitting: Internal Medicine

## 2015-08-05 ENCOUNTER — Other Ambulatory Visit (INDEPENDENT_AMBULATORY_CARE_PROVIDER_SITE_OTHER): Payer: Self-pay | Admitting: Internal Medicine

## 2015-08-05 DIAGNOSIS — R1310 Dysphagia, unspecified: Secondary | ICD-10-CM

## 2015-08-10 ENCOUNTER — Telehealth (INDEPENDENT_AMBULATORY_CARE_PROVIDER_SITE_OTHER): Payer: Self-pay | Admitting: *Deleted

## 2015-08-10 NOTE — Telephone Encounter (Signed)
FYI: Patient called to cancel esophagram, states she has a growth on her thyroid and she thinks her problem is coming from that, she is scheduled to see a thyroid specialist first week of June

## 2015-08-11 NOTE — Telephone Encounter (Signed)
noted 

## 2015-08-16 ENCOUNTER — Ambulatory Visit (HOSPITAL_COMMUNITY): Payer: Medicare HMO

## 2015-09-08 ENCOUNTER — Ambulatory Visit (INDEPENDENT_AMBULATORY_CARE_PROVIDER_SITE_OTHER): Payer: Medicare HMO | Admitting: Family Medicine

## 2015-09-08 ENCOUNTER — Encounter: Payer: Self-pay | Admitting: Family Medicine

## 2015-09-08 VITALS — BP 100/70 | HR 92 | Resp 16

## 2015-09-08 DIAGNOSIS — R51 Headache: Secondary | ICD-10-CM | POA: Diagnosis not present

## 2015-09-08 DIAGNOSIS — L03116 Cellulitis of left lower limb: Secondary | ICD-10-CM

## 2015-09-08 DIAGNOSIS — E890 Postprocedural hypothyroidism: Secondary | ICD-10-CM

## 2015-09-08 DIAGNOSIS — R0789 Other chest pain: Secondary | ICD-10-CM

## 2015-09-08 DIAGNOSIS — F411 Generalized anxiety disorder: Secondary | ICD-10-CM

## 2015-09-08 DIAGNOSIS — K140 Glossitis: Secondary | ICD-10-CM

## 2015-09-08 DIAGNOSIS — R519 Headache, unspecified: Secondary | ICD-10-CM | POA: Insufficient documentation

## 2015-09-08 DIAGNOSIS — R079 Chest pain, unspecified: Secondary | ICD-10-CM

## 2015-09-08 MED ORDER — FLUCONAZOLE 100 MG PO TABS: 100.0000 mg | ORAL_TABLET | Freq: Every day | ORAL | Status: DC

## 2015-09-08 MED ORDER — DOXYCYCLINE HYCLATE 100 MG PO TABS: 100.0000 mg | ORAL_TABLET | Freq: Two times a day (BID) | ORAL | Status: DC

## 2015-09-08 NOTE — Progress Notes (Signed)
   Subjective:    Patient ID: [name] Fitzpatrick, female    DOB: 06/30/1955, 60 y.o.   MRN: [id number]  HPI C/o acute pop in region of her  Forehead yesterday, following this she has continued to  Have intermittent blurry vision , less steady on her feet and her speech is slurred. Also reports redness in different areas of her body, lower legs, states lips sting and burn, states palms and fingertips burn, states she has had cellulitis in the past, ankles resemble cellulitis, but not the  Other areas where she has extreme burning , particularly in her palms. Also c/o 3 to 4 week h/o intermittent left chest pain which radiates to arm,responds to aspirin, but recurring, started since she had the colonoscopy, when she had a normal EKG. Declines EKG in office today, no current pain, but is interested in cardiology evaluation Repeatedly asks if her neurolgic symptoms are due to TIA, which she reports her Mother who died in April 09, 2022 of this year had repeated episodes of, she is extremely anxious and scared that the same is happening to her C/o thrush on tongue, burning and redness x 1 day, wants oral medication for this  Review of Systems See HPI Denies recent fever or chills. Denies sinus pressure, nasal congestion, ear pain or sore throat. Denies chest congestion, productive cough or wheezing. Denies palpitations and leg swelling Denies abdominal pain, nausea, vomiting,diarrhea or constipation.   Denies dysuria, frequency, hesitancy or incontinence. Denies uncontrolled joint pain, swelling and has chronic limitation in mobility relies on a cane.        Objective:   Physical Exam BP 100/70 mmHg  Pulse 92  Resp 16  SpO2 96% Patient alert and oriented and in no cardiopulmonary distress.Extremely anxiouis  HEENT: No facial asymmetry, EOMI,   oropharynx pink and moist.  Neck adequate ROM no JVD,  Chest: Clear to auscultation bilaterally.No reproducible chest wall tenderness. Mild erythema of  tongue ,   CVS: S1, S2 no murmurs, no S3.Regular rate.  ABD: Soft non tender.   Ext: No edema  MS: Decreased  ROM spine, shoulders, hips and knees.  Skin: Intact, erythema and warmth of left ankle  Psych: Good eye contact, extremely anxious affect  CNS:  power,  normal throughout.no focal deficits noted.        Assessment & Plan:  Headache, acute 1 day h/o acte headache "pop" followed by blurred vision and difficulty with speech and gait 24 hrs after the event Urgent MRI and neurologic evaluation  Chest pain 4 week h/o new chest pain intermittently, no s[pecific aggravating factor noted, pt relates radiation to arm, refuses EKG in office but agrees to go to ED for further eval with worsening or persistent symptoms. Reports positive response to aspirin, had EKG approx 5 weeks ago prior to colonoscopy, no red flags at that time  Cellulitis of leg, left Area involved is lower leg just proximal to ankle, antibiotic course prescribed  Glossitis Acute redness, with burning of the tongue, has responded in the past to fluconazole, pt requests oral med for management , short course prescribed  Anxiety state Uncontrolled, pt managed by psychiatry, however , currently uncontrolled. I believe that she is still coping with her Mother's passing , 6 months ago, and now feels as though her current symptoms are similar to her Mother's own, she is encouraged to continue to follow up with psychiatry  Post-operative hypothyroidism Being managed once more by endo at [facility]

## 2015-09-08 NOTE — Patient Instructions (Addendum)
F/u as  Befiore, call if you need me sooner  Please go to the ED for recurrent  chest pain , also for worsening speech or vision disturbance or headache or difficulty with speech. I recommend EKG and ED evaluation today but you refuse both  For thrush, fluconazole tablets prescribed   For cellulitis, doxycycline is prescribed  You are referred for evaluation by neurology, an MRI of your brain and also to see cardiology for chest pain

## 2015-09-09 DIAGNOSIS — L03116 Cellulitis of left lower limb: Secondary | ICD-10-CM | POA: Insufficient documentation

## 2015-09-09 DIAGNOSIS — R079 Chest pain, unspecified: Secondary | ICD-10-CM | POA: Insufficient documentation

## 2015-09-09 DIAGNOSIS — F411 Generalized anxiety disorder: Secondary | ICD-10-CM | POA: Insufficient documentation

## 2015-09-09 DIAGNOSIS — K140 Glossitis: Secondary | ICD-10-CM | POA: Insufficient documentation

## 2015-09-09 NOTE — Assessment & Plan Note (Signed)
1 day h/o acte headache "pop" followed by blurred vision and difficulty with speech and gait 24 hrs after the event Urgent MRI and neurologic evaluation

## 2015-09-09 NOTE — Assessment & Plan Note (Signed)
Uncontrolled, pt managed by psychiatry, however , currently uncontrolled. I believe that she is still coping with her Mother's passing , 6 months ago, and now feels as though her current symptoms are similar to her Mother's own, she is encouraged to continue to follow up with psychiatry

## 2015-09-09 NOTE — Assessment & Plan Note (Signed)
Area involved is lower leg just proximal to ankle, antibiotic course prescribed

## 2015-09-09 NOTE — Assessment & Plan Note (Signed)
4 week h/o new chest pain intermittently, no s[pecific aggravating factor noted, pt relates radiation to arm, refuses EKG in office but agrees to go to ED for further eval with worsening or persistent symptoms. Reports positive response to aspirin, had EKG approx 5 weeks ago prior to colonoscopy, no red flags at that time

## 2015-09-09 NOTE — Assessment & Plan Note (Signed)
Acute redness, with burning of the tongue, has responded in the past to fluconazole, pt requests oral med for management , short course prescribed

## 2015-09-09 NOTE — Assessment & Plan Note (Signed)
Being managed once more by endo at [facility]

## 2015-09-22 ENCOUNTER — Ambulatory Visit: Payer: Self-pay | Admitting: Family Medicine

## 2015-09-22 ENCOUNTER — Telehealth: Payer: Self-pay | Admitting: Family Medicine

## 2015-09-22 NOTE — Telephone Encounter (Signed)
Dr. Hiram Comber is currently scheduled for an MRI next Friday July 14th at 9:45/Pt is asking if this is still necessary beings she is scheduled with the cardiologist at [facility] on 10/19/15, please advise?

## 2015-09-22 NOTE — Telephone Encounter (Signed)
The mRI of her brain was ordered foiher e headache, so the cardiologist would not be ordering that, so I still recommend she get the mRI, pls let me know if she needs special medication to calm her when she is getting the MRI

## 2015-09-26 NOTE — Telephone Encounter (Signed)
Patient is aware 

## 2015-09-30 ENCOUNTER — Ambulatory Visit (HOSPITAL_COMMUNITY): Payer: Medicare HMO

## 2015-10-03 ENCOUNTER — Telehealth (INDEPENDENT_AMBULATORY_CARE_PROVIDER_SITE_OTHER): Payer: Self-pay | Admitting: Internal Medicine

## 2015-10-03 NOTE — Telephone Encounter (Signed)
Patient called, stated that Dr. Griffin Dakin nurse told her to call here for a refill on Omeprazole because she states you told her to take 2 a day and she only had 30 pills.    [phone]

## 2015-10-04 NOTE — Telephone Encounter (Signed)
I called the patient and left her a message on her answering machine.

## 2015-10-04 NOTE — Telephone Encounter (Signed)
Please let patient know to just take one Omeprazole a day.

## 2015-10-12 ENCOUNTER — Ambulatory Visit (HOSPITAL_COMMUNITY): Payer: Medicare HMO

## 2015-10-26 ENCOUNTER — Other Ambulatory Visit: Payer: Self-pay | Admitting: Family Medicine

## 2015-11-02 NOTE — Addendum Note (Signed)
Addended by: Denman George B on: 11/02/2015 10:32 AM   Modules accepted: Orders

## 2015-11-16 ENCOUNTER — Ambulatory Visit (HOSPITAL_COMMUNITY): Payer: Medicare HMO

## 2015-11-16 ENCOUNTER — Other Ambulatory Visit: Payer: Self-pay | Admitting: Family Medicine

## 2015-11-17 ENCOUNTER — Ambulatory Visit (HOSPITAL_COMMUNITY)
Admission: RE | Admit: 2015-11-17 | Discharge: 2015-11-17 | Disposition: A | Discharge status: Home / Self Care | Payer: Medicare HMO | Source: Ambulatory Visit | Attending: Family Medicine | Admitting: Family Medicine

## 2015-11-17 DIAGNOSIS — R51 Headache: Secondary | ICD-10-CM | POA: Diagnosis not present

## 2015-11-17 DIAGNOSIS — R519 Headache, unspecified: Secondary | ICD-10-CM

## 2015-11-18 ENCOUNTER — Other Ambulatory Visit: Payer: Self-pay | Admitting: Family Medicine

## 2015-11-18 DIAGNOSIS — R42 Dizziness and giddiness: Secondary | ICD-10-CM

## 2015-11-18 DIAGNOSIS — R0989 Other specified symptoms and signs involving the circulatory and respiratory systems: Secondary | ICD-10-CM

## 2015-11-18 NOTE — Progress Notes (Signed)
Us carotid  

## 2015-11-18 NOTE — Progress Notes (Signed)
evaquin

## 2015-12-08 ENCOUNTER — Ambulatory Visit (HOSPITAL_COMMUNITY): Admission: RE | Admit: 2015-12-08 | Payer: Medicare HMO | Source: Ambulatory Visit

## 2015-12-19 ENCOUNTER — Other Ambulatory Visit: Payer: Self-pay | Admitting: Family Medicine

## 2016-01-20 ENCOUNTER — Other Ambulatory Visit: Payer: Self-pay | Admitting: Family Medicine

## 2016-04-11 ENCOUNTER — Other Ambulatory Visit: Payer: Self-pay | Admitting: Family Medicine

## 2016-04-23 DIAGNOSIS — M25571 Pain in right ankle and joints of right foot: Secondary | ICD-10-CM | POA: Diagnosis not present

## 2016-04-23 DIAGNOSIS — G894 Chronic pain syndrome: Secondary | ICD-10-CM | POA: Diagnosis not present

## 2016-04-23 DIAGNOSIS — M961 Postlaminectomy syndrome, not elsewhere classified: Secondary | ICD-10-CM | POA: Diagnosis not present

## 2016-04-23 DIAGNOSIS — Z79891 Long term (current) use of opiate analgesic: Secondary | ICD-10-CM | POA: Diagnosis not present

## 2016-05-31 DIAGNOSIS — Z79899 Other long term (current) drug therapy: Secondary | ICD-10-CM | POA: Diagnosis not present

## 2016-05-31 DIAGNOSIS — E063 Autoimmune thyroiditis: Secondary | ICD-10-CM | POA: Diagnosis not present

## 2016-05-31 DIAGNOSIS — E038 Other specified hypothyroidism: Secondary | ICD-10-CM | POA: Diagnosis not present

## 2016-06-07 ENCOUNTER — Telehealth: Payer: Self-pay | Admitting: Family Medicine

## 2016-06-11 DIAGNOSIS — R209 Unspecified disturbances of skin sensation: Secondary | ICD-10-CM | POA: Diagnosis not present

## 2016-06-11 DIAGNOSIS — E785 Hyperlipidemia, unspecified: Secondary | ICD-10-CM | POA: Diagnosis not present

## 2016-06-11 DIAGNOSIS — M153 Secondary multiple arthritis: Secondary | ICD-10-CM | POA: Diagnosis not present

## 2016-06-11 DIAGNOSIS — E063 Autoimmune thyroiditis: Secondary | ICD-10-CM | POA: Diagnosis not present

## 2016-06-11 DIAGNOSIS — R252 Cramp and spasm: Secondary | ICD-10-CM | POA: Diagnosis not present

## 2016-06-11 DIAGNOSIS — I8392 Asymptomatic varicose veins of left lower extremity: Secondary | ICD-10-CM | POA: Diagnosis not present

## 2016-06-11 DIAGNOSIS — E038 Other specified hypothyroidism: Secondary | ICD-10-CM | POA: Diagnosis not present

## 2016-06-13 DIAGNOSIS — E063 Autoimmune thyroiditis: Secondary | ICD-10-CM | POA: Diagnosis not present

## 2016-06-13 DIAGNOSIS — E785 Hyperlipidemia, unspecified: Secondary | ICD-10-CM | POA: Diagnosis not present

## 2016-06-13 DIAGNOSIS — R252 Cramp and spasm: Secondary | ICD-10-CM | POA: Diagnosis not present

## 2016-06-13 DIAGNOSIS — E038 Other specified hypothyroidism: Secondary | ICD-10-CM | POA: Diagnosis not present

## 2016-06-19 DIAGNOSIS — Z79891 Long term (current) use of opiate analgesic: Secondary | ICD-10-CM | POA: Diagnosis not present

## 2016-06-19 DIAGNOSIS — G894 Chronic pain syndrome: Secondary | ICD-10-CM | POA: Diagnosis not present

## 2016-06-19 DIAGNOSIS — M961 Postlaminectomy syndrome, not elsewhere classified: Secondary | ICD-10-CM | POA: Diagnosis not present

## 2016-06-19 DIAGNOSIS — M25571 Pain in right ankle and joints of right foot: Secondary | ICD-10-CM | POA: Diagnosis not present

## 2016-06-22 ENCOUNTER — Ambulatory Visit (HOSPITAL_COMMUNITY)
Admission: RE | Admit: 2016-06-22 | Discharge: 2016-06-22 | Disposition: A | Discharge status: Home / Self Care | Payer: Medicare HMO | Source: Ambulatory Visit | Attending: Urology | Admitting: Urology

## 2016-06-22 ENCOUNTER — Other Ambulatory Visit (HOSPITAL_COMMUNITY): Payer: Self-pay | Admitting: Urology

## 2016-06-22 DIAGNOSIS — I6521 Occlusion and stenosis of right carotid artery: Secondary | ICD-10-CM | POA: Diagnosis not present

## 2016-06-22 DIAGNOSIS — Z981 Arthrodesis status: Secondary | ICD-10-CM | POA: Diagnosis not present

## 2016-06-22 DIAGNOSIS — M542 Cervicalgia: Secondary | ICD-10-CM

## 2016-06-22 DIAGNOSIS — M47812 Spondylosis without myelopathy or radiculopathy, cervical region: Secondary | ICD-10-CM | POA: Insufficient documentation

## 2016-06-25 DIAGNOSIS — Z0100 Encounter for examination of eyes and vision without abnormal findings: Secondary | ICD-10-CM | POA: Diagnosis not present

## 2016-06-26 DIAGNOSIS — R609 Edema, unspecified: Secondary | ICD-10-CM | POA: Diagnosis not present

## 2016-06-26 DIAGNOSIS — R200 Anesthesia of skin: Secondary | ICD-10-CM | POA: Diagnosis not present

## 2016-06-29 DIAGNOSIS — Z79891 Long term (current) use of opiate analgesic: Secondary | ICD-10-CM | POA: Diagnosis not present

## 2016-07-02 DIAGNOSIS — R5383 Other fatigue: Secondary | ICD-10-CM | POA: Diagnosis not present

## 2016-07-02 DIAGNOSIS — E039 Hypothyroidism, unspecified: Secondary | ICD-10-CM | POA: Diagnosis not present

## 2016-07-02 DIAGNOSIS — R899 Unspecified abnormal finding in specimens from other organs, systems and tissues: Secondary | ICD-10-CM | POA: Diagnosis not present

## 2016-07-12 DIAGNOSIS — Z0100 Encounter for examination of eyes and vision without abnormal findings: Secondary | ICD-10-CM | POA: Diagnosis not present

## 2016-07-12 DIAGNOSIS — H52209 Unspecified astigmatism, unspecified eye: Secondary | ICD-10-CM | POA: Diagnosis not present

## 2016-07-12 DIAGNOSIS — H524 Presbyopia: Secondary | ICD-10-CM | POA: Diagnosis not present

## 2016-07-12 DIAGNOSIS — H5203 Hypermetropia, bilateral: Secondary | ICD-10-CM | POA: Diagnosis not present

## 2016-07-16 ENCOUNTER — Telehealth: Payer: Self-pay

## 2016-07-16 NOTE — Telephone Encounter (Signed)
Called pt to schedule AWV. No answer no voicemail - anr

## 2016-07-24 DIAGNOSIS — F4310 Post-traumatic stress disorder, unspecified: Secondary | ICD-10-CM | POA: Diagnosis not present

## 2016-07-24 DIAGNOSIS — R69 Illness, unspecified: Secondary | ICD-10-CM | POA: Diagnosis not present

## 2016-08-15 DIAGNOSIS — M961 Postlaminectomy syndrome, not elsewhere classified: Secondary | ICD-10-CM | POA: Diagnosis not present

## 2016-08-15 DIAGNOSIS — G894 Chronic pain syndrome: Secondary | ICD-10-CM | POA: Diagnosis not present

## 2016-08-15 DIAGNOSIS — Z79891 Long term (current) use of opiate analgesic: Secondary | ICD-10-CM | POA: Diagnosis not present

## 2016-08-15 DIAGNOSIS — M25571 Pain in right ankle and joints of right foot: Secondary | ICD-10-CM | POA: Diagnosis not present

## 2016-08-20 DIAGNOSIS — E039 Hypothyroidism, unspecified: Secondary | ICD-10-CM | POA: Diagnosis not present

## 2016-08-20 DIAGNOSIS — I341 Nonrheumatic mitral (valve) prolapse: Secondary | ICD-10-CM | POA: Diagnosis not present

## 2016-08-20 DIAGNOSIS — R5382 Chronic fatigue, unspecified: Secondary | ICD-10-CM | POA: Diagnosis not present

## 2016-08-20 DIAGNOSIS — E785 Hyperlipidemia, unspecified: Secondary | ICD-10-CM | POA: Diagnosis not present

## 2016-08-20 DIAGNOSIS — Z780 Asymptomatic menopausal state: Secondary | ICD-10-CM | POA: Diagnosis not present

## 2016-08-20 DIAGNOSIS — R079 Chest pain, unspecified: Secondary | ICD-10-CM | POA: Diagnosis not present

## 2016-08-20 DIAGNOSIS — R5383 Other fatigue: Secondary | ICD-10-CM | POA: Diagnosis not present

## 2016-08-27 DIAGNOSIS — M47892 Other spondylosis, cervical region: Secondary | ICD-10-CM | POA: Diagnosis not present

## 2016-08-27 DIAGNOSIS — Z981 Arthrodesis status: Secondary | ICD-10-CM | POA: Diagnosis not present

## 2016-08-27 DIAGNOSIS — M5083 Other cervical disc disorders, cervicothoracic region: Secondary | ICD-10-CM | POA: Diagnosis not present

## 2016-08-27 DIAGNOSIS — M47812 Spondylosis without myelopathy or radiculopathy, cervical region: Secondary | ICD-10-CM | POA: Diagnosis not present

## 2016-08-27 DIAGNOSIS — M5081 Other cervical disc disorders,  high cervical region: Secondary | ICD-10-CM | POA: Diagnosis not present

## 2016-08-27 DIAGNOSIS — M4802 Spinal stenosis, cervical region: Secondary | ICD-10-CM | POA: Diagnosis not present

## 2016-08-27 DIAGNOSIS — M5021 Other cervical disc displacement,  high cervical region: Secondary | ICD-10-CM | POA: Diagnosis not present

## 2016-09-13 DIAGNOSIS — L03011 Cellulitis of right finger: Secondary | ICD-10-CM | POA: Diagnosis not present

## 2016-09-29 DIAGNOSIS — J479 Bronchiectasis, uncomplicated: Secondary | ICD-10-CM | POA: Diagnosis not present

## 2016-09-29 DIAGNOSIS — R130 Aphagia: Secondary | ICD-10-CM | POA: Diagnosis not present

## 2016-09-29 DIAGNOSIS — R0902 Hypoxemia: Secondary | ICD-10-CM | POA: Diagnosis not present

## 2016-10-11 DIAGNOSIS — G894 Chronic pain syndrome: Secondary | ICD-10-CM | POA: Diagnosis not present

## 2016-10-11 DIAGNOSIS — Z79891 Long term (current) use of opiate analgesic: Secondary | ICD-10-CM | POA: Diagnosis not present

## 2016-10-11 DIAGNOSIS — G5771 Causalgia of right lower limb: Secondary | ICD-10-CM | POA: Diagnosis not present

## 2016-10-11 DIAGNOSIS — M961 Postlaminectomy syndrome, not elsewhere classified: Secondary | ICD-10-CM | POA: Diagnosis not present

## 2016-10-11 DIAGNOSIS — M4722 Other spondylosis with radiculopathy, cervical region: Secondary | ICD-10-CM | POA: Diagnosis not present

## 2016-10-11 DIAGNOSIS — M62838 Other muscle spasm: Secondary | ICD-10-CM | POA: Diagnosis not present

## 2016-10-11 DIAGNOSIS — M25571 Pain in right ankle and joints of right foot: Secondary | ICD-10-CM | POA: Diagnosis not present

## 2016-11-22 DIAGNOSIS — E063 Autoimmune thyroiditis: Secondary | ICD-10-CM | POA: Diagnosis not present

## 2016-11-22 DIAGNOSIS — E038 Other specified hypothyroidism: Secondary | ICD-10-CM | POA: Diagnosis not present

## 2016-12-11 DIAGNOSIS — M25571 Pain in right ankle and joints of right foot: Secondary | ICD-10-CM | POA: Diagnosis not present

## 2016-12-11 DIAGNOSIS — M961 Postlaminectomy syndrome, not elsewhere classified: Secondary | ICD-10-CM | POA: Diagnosis not present

## 2016-12-11 DIAGNOSIS — G894 Chronic pain syndrome: Secondary | ICD-10-CM | POA: Diagnosis not present

## 2016-12-11 DIAGNOSIS — Z79891 Long term (current) use of opiate analgesic: Secondary | ICD-10-CM | POA: Diagnosis not present

## 2016-12-17 NOTE — Telephone Encounter (Signed)
No further follow up needed.

## 2016-12-19 DIAGNOSIS — Z8249 Family history of ischemic heart disease and other diseases of the circulatory system: Secondary | ICD-10-CM | POA: Diagnosis not present

## 2016-12-19 DIAGNOSIS — Z23 Encounter for immunization: Secondary | ICD-10-CM | POA: Diagnosis not present

## 2016-12-19 DIAGNOSIS — Z0000 Encounter for general adult medical examination without abnormal findings: Secondary | ICD-10-CM | POA: Diagnosis not present

## 2016-12-19 DIAGNOSIS — Z1231 Encounter for screening mammogram for malignant neoplasm of breast: Secondary | ICD-10-CM | POA: Diagnosis not present

## 2016-12-26 ENCOUNTER — Other Ambulatory Visit: Payer: Self-pay | Admitting: Family Medicine

## 2017-01-01 ENCOUNTER — Other Ambulatory Visit (HOSPITAL_COMMUNITY): Payer: Self-pay | Admitting: Physician Assistant

## 2017-01-01 DIAGNOSIS — Z8249 Family history of ischemic heart disease and other diseases of the circulatory system: Secondary | ICD-10-CM

## 2017-01-03 DIAGNOSIS — Z780 Asymptomatic menopausal state: Secondary | ICD-10-CM | POA: Diagnosis not present

## 2017-01-03 DIAGNOSIS — R69 Illness, unspecified: Secondary | ICD-10-CM | POA: Diagnosis not present

## 2017-01-03 DIAGNOSIS — E063 Autoimmune thyroiditis: Secondary | ICD-10-CM | POA: Diagnosis not present

## 2017-01-03 DIAGNOSIS — Z1231 Encounter for screening mammogram for malignant neoplasm of breast: Secondary | ICD-10-CM | POA: Diagnosis not present

## 2017-01-03 DIAGNOSIS — Z1159 Encounter for screening for other viral diseases: Secondary | ICD-10-CM | POA: Diagnosis not present

## 2017-01-03 DIAGNOSIS — E038 Other specified hypothyroidism: Secondary | ICD-10-CM | POA: Diagnosis not present

## 2017-01-08 DIAGNOSIS — F4310 Post-traumatic stress disorder, unspecified: Secondary | ICD-10-CM | POA: Diagnosis not present

## 2017-01-08 DIAGNOSIS — R69 Illness, unspecified: Secondary | ICD-10-CM | POA: Diagnosis not present

## 2017-01-10 ENCOUNTER — Ambulatory Visit (HOSPITAL_COMMUNITY): Admission: RE | Admit: 2017-01-10 | Payer: Medicare HMO | Source: Ambulatory Visit

## 2017-02-05 DIAGNOSIS — G894 Chronic pain syndrome: Secondary | ICD-10-CM | POA: Diagnosis not present

## 2017-02-05 DIAGNOSIS — M25571 Pain in right ankle and joints of right foot: Secondary | ICD-10-CM | POA: Diagnosis not present

## 2017-02-05 DIAGNOSIS — M961 Postlaminectomy syndrome, not elsewhere classified: Secondary | ICD-10-CM | POA: Diagnosis not present

## 2017-02-05 DIAGNOSIS — Z79891 Long term (current) use of opiate analgesic: Secondary | ICD-10-CM | POA: Diagnosis not present

## 2017-12-30 ENCOUNTER — Ambulatory Visit (INDEPENDENT_AMBULATORY_CARE_PROVIDER_SITE_OTHER): Payer: Self-pay | Admitting: Internal Medicine

## 2018-12-10 ENCOUNTER — Other Ambulatory Visit (HOSPITAL_COMMUNITY): Payer: Medicare HMO

## 2018-12-10 ENCOUNTER — Inpatient Hospital Stay
Admission: RE | Admit: 2018-12-10 | Discharge: 2019-01-09 | Disposition: A | Discharge status: Home / Self Care | Payer: Medicare HMO | Source: Other Acute Inpatient Hospital | Attending: Internal Medicine | Admitting: Internal Medicine

## 2018-12-10 DIAGNOSIS — Z931 Gastrostomy status: Secondary | ICD-10-CM

## 2018-12-10 DIAGNOSIS — J80 Acute respiratory distress syndrome: Secondary | ICD-10-CM | POA: Diagnosis present

## 2018-12-10 DIAGNOSIS — I509 Heart failure, unspecified: Secondary | ICD-10-CM

## 2018-12-10 DIAGNOSIS — J9621 Acute and chronic respiratory failure with hypoxia: Secondary | ICD-10-CM | POA: Diagnosis present

## 2018-12-10 DIAGNOSIS — J189 Pneumonia, unspecified organism: Secondary | ICD-10-CM

## 2018-12-10 DIAGNOSIS — I82412 Acute embolism and thrombosis of left femoral vein: Secondary | ICD-10-CM | POA: Diagnosis present

## 2018-12-10 DIAGNOSIS — J811 Chronic pulmonary edema: Secondary | ICD-10-CM

## 2018-12-10 DIAGNOSIS — M25642 Stiffness of left hand, not elsewhere classified: Secondary | ICD-10-CM

## 2018-12-10 DIAGNOSIS — G40909 Epilepsy, unspecified, not intractable, without status epilepticus: Secondary | ICD-10-CM

## 2018-12-10 DIAGNOSIS — R0902 Hypoxemia: Secondary | ICD-10-CM

## 2018-12-10 DIAGNOSIS — J9690 Respiratory failure, unspecified, unspecified whether with hypoxia or hypercapnia: Secondary | ICD-10-CM

## 2018-12-10 DIAGNOSIS — J849 Interstitial pulmonary disease, unspecified: Secondary | ICD-10-CM | POA: Diagnosis present

## 2018-12-10 DIAGNOSIS — K5900 Constipation, unspecified: Secondary | ICD-10-CM

## 2018-12-10 HISTORY — DX: Acute respiratory distress syndrome: J80

## 2018-12-10 HISTORY — DX: Interstitial pulmonary disease, unspecified: J84.9

## 2018-12-10 HISTORY — DX: Acute embolism and thrombosis of left femoral vein: I82.412

## 2018-12-10 HISTORY — DX: Epilepsy, unspecified, not intractable, without status epilepticus: G40.909

## 2018-12-10 HISTORY — DX: Acute and chronic respiratory failure with hypoxia: J96.21

## 2018-12-10 MED ORDER — IOHEXOL 300 MG/ML  SOLN
50.0000 mL | Freq: Once | INTRAMUSCULAR | Status: AC | PRN
  Administered 2018-12-10: 50 mL

## 2018-12-11 ENCOUNTER — Other Ambulatory Visit (HOSPITAL_COMMUNITY): Payer: Medicare HMO

## 2018-12-11 ENCOUNTER — Inpatient Hospital Stay (HOSPITAL_BASED_OUTPATIENT_CLINIC_OR_DEPARTMENT_OTHER)
Admission: RE | Admit: 2018-12-11 | Discharge: 2018-12-11 | Disposition: A | Discharge status: Home / Self Care | Payer: Medicare HMO | Source: Other Acute Inpatient Hospital | Attending: Internal Medicine | Admitting: Internal Medicine

## 2018-12-11 ENCOUNTER — Encounter: Payer: Self-pay | Admitting: Internal Medicine

## 2018-12-11 DIAGNOSIS — I82412 Acute embolism and thrombosis of left femoral vein: Secondary | ICD-10-CM | POA: Diagnosis present

## 2018-12-11 DIAGNOSIS — J849 Interstitial pulmonary disease, unspecified: Secondary | ICD-10-CM | POA: Diagnosis present

## 2018-12-11 DIAGNOSIS — R569 Unspecified convulsions: Secondary | ICD-10-CM

## 2018-12-11 DIAGNOSIS — J80 Acute respiratory distress syndrome: Secondary | ICD-10-CM | POA: Diagnosis present

## 2018-12-11 DIAGNOSIS — J9621 Acute and chronic respiratory failure with hypoxia: Secondary | ICD-10-CM | POA: Diagnosis not present

## 2018-12-11 DIAGNOSIS — G40909 Epilepsy, unspecified, not intractable, without status epilepticus: Secondary | ICD-10-CM

## 2018-12-11 LAB — BLOOD GAS, ARTERIAL
Acid-Base Excess: 5.1 mmol/L — ABNORMAL HIGH (ref 0.0–2.0)
Bicarbonate: 30.5 mmol/L — ABNORMAL HIGH (ref 20.0–28.0)
FIO2: 50
O2 Saturation: 95.5 %
PEEP: 5 cmH2O
Patient temperature: 98.6
Pressure control: 14 cmH2O
RATE: 14 resp/min
pCO2 arterial: 57.9 mmHg — ABNORMAL HIGH (ref 32.0–48.0)
pH, Arterial: 7.341 — ABNORMAL LOW (ref 7.350–7.450)
pO2, Arterial: 101 mmHg (ref 83.0–108.0)

## 2018-12-11 LAB — CBC
HCT: 36.7 % (ref 36.0–46.0)
Hemoglobin: 10.9 g/dL — ABNORMAL LOW (ref 12.0–15.0)
MCH: 24.3 pg — ABNORMAL LOW (ref 26.0–34.0)
MCHC: 29.7 g/dL — ABNORMAL LOW (ref 30.0–36.0)
MCV: 81.7 fL (ref 80.0–100.0)
Platelets: 625 10*3/uL — ABNORMAL HIGH (ref 150–400)
RBC: 4.49 MIL/uL (ref 3.87–5.11)
RDW: 16.5 % — ABNORMAL HIGH (ref 11.5–15.5)
WBC: 21.4 10*3/uL — ABNORMAL HIGH (ref 4.0–10.5)
nRBC: 0 % (ref 0.0–0.2)

## 2018-12-11 LAB — BASIC METABOLIC PANEL
Anion gap: 13 (ref 5–15)
BUN: 19 mg/dL (ref 8–23)
CO2: 29 mmol/L (ref 22–32)
Calcium: 9.8 mg/dL (ref 8.9–10.3)
Chloride: 97 mmol/L — ABNORMAL LOW (ref 98–111)
Creatinine, Ser: 0.42 mg/dL — ABNORMAL LOW (ref 0.44–1.00)
GFR calc Af Amer: >60 mL/min (ref >60)
GFR calc non Af Amer: >60 mL/min (ref >60)
Glucose, Bld: 145 mg/dL — ABNORMAL HIGH (ref 70–99)
Potassium: 3.7 mmol/L (ref 3.5–5.1)
Sodium: 139 mmol/L (ref 135–145)

## 2018-12-11 LAB — PROTIME-INR
INR: 1.3 — ABNORMAL HIGH (ref 0.8–1.2)
Prothrombin Time: 16.2 seconds — ABNORMAL HIGH (ref 11.4–15.2)

## 2018-12-11 MED ORDER — GABAPENTIN 250 MG/5ML PO SOLN
125.00 | ORAL | Status: DC
Start: 2018-12-11 — End: 2018-12-11

## 2018-12-11 MED ORDER — FAMOTIDINE 20 MG PO TABS
20.00 | ORAL_TABLET | ORAL | Status: DC
Start: 2018-12-10 — End: 2018-12-11

## 2018-12-11 MED ORDER — ALUM & MAG HYDROXIDE-SIMETH 200-200-20 MG/5ML PO SUSP
30.00 | ORAL | Status: DC
Start: ? — End: 2018-12-11

## 2018-12-11 MED ORDER — GENERIC EXTERNAL MEDICATION
0.10 | Status: DC
Start: ? — End: 2018-12-11

## 2018-12-11 MED ORDER — IPRATROPIUM-ALBUTEROL 0.5-2.5 (3) MG/3ML IN SOLN
3.00 | RESPIRATORY_TRACT | Status: DC
Start: 2018-12-10 — End: 2018-12-11

## 2018-12-11 MED ORDER — FENTANYL CITRATE (PF) 2500 MCG/50ML IJ SOLN
100.00 | INTRAMUSCULAR | Status: DC
Start: ? — End: 2018-12-11

## 2018-12-11 MED ORDER — APIXABAN 5 MG PO TABS
5.00 | ORAL_TABLET | ORAL | Status: DC
Start: 2018-12-11 — End: 2018-12-11

## 2018-12-11 MED ORDER — TROPICAL LIQUID NUTRITION PO LIQD
5.00 | ORAL | Status: DC
Start: 2018-12-10 — End: 2018-12-11

## 2018-12-11 MED ORDER — COMPOUND W FREEZE OFF EX AERO
1.00 | INHALATION_SPRAY | CUTANEOUS | Status: DC
Start: 2018-12-10 — End: 2018-12-11

## 2018-12-11 MED ORDER — OLANZAPINE 5 MG PO TBDP
2.50 | ORAL_TABLET | ORAL | Status: DC
Start: 2018-12-10 — End: 2018-12-11

## 2018-12-11 MED ORDER — DIPHENOXYLATE-ATROPINE 2.5-0.025 MG/5ML PO LIQD
5.00 | ORAL | Status: DC
Start: ? — End: 2018-12-11

## 2018-12-11 MED ORDER — MELATONIN 1 MG PO TABS
1.00 | ORAL_TABLET | ORAL | Status: DC
Start: 2018-12-11 — End: 2018-12-11

## 2018-12-11 MED ORDER — ZOLPIDEM TARTRATE 5 MG PO TABS
5.00 | ORAL_TABLET | ORAL | Status: DC
Start: ? — End: 2018-12-11

## 2018-12-11 MED ORDER — GUAIFENESIN 100 MG/5ML PO LIQD
400.00 | ORAL | Status: DC
Start: 2018-12-10 — End: 2018-12-11

## 2018-12-11 MED ORDER — ACETAMINOPHEN 160 MG/5ML PO SUSP
650.00 | ORAL | Status: DC
Start: ? — End: 2018-12-11

## 2018-12-11 MED ORDER — INSULIN GLARGINE 100 UNIT/ML ~~LOC~~ SOLN
1.00 | SUBCUTANEOUS | Status: DC
Start: 2018-12-10 — End: 2018-12-11

## 2018-12-11 MED ORDER — BUSPIRONE HCL 10 MG PO TABS
20.00 | ORAL_TABLET | ORAL | Status: DC
Start: 2018-12-10 — End: 2018-12-11

## 2018-12-11 MED ORDER — LORAZEPAM 0.5 MG PO TABS
0.50 | ORAL_TABLET | ORAL | Status: DC
Start: ? — End: 2018-12-11

## 2018-12-11 MED ORDER — ONDANSETRON HCL 4 MG/2ML IJ SOLN
4.00 | INTRAMUSCULAR | Status: DC
Start: ? — End: 2018-12-11

## 2018-12-11 MED ORDER — LEVOTHYROXINE SODIUM 75 MCG PO TABS
225.00 | ORAL_TABLET | ORAL | Status: DC
Start: 2018-12-11 — End: 2018-12-11

## 2018-12-11 MED ORDER — CLONAZEPAM 0.5 MG PO TABS
0.50 | ORAL_TABLET | ORAL | Status: DC
Start: 2018-12-10 — End: 2018-12-11

## 2018-12-11 MED ORDER — SERTRALINE HCL 20 MG/ML PO CONC
75.00 | ORAL | Status: DC
Start: 2018-12-11 — End: 2018-12-11

## 2018-12-11 MED ORDER — ALBUTEROL SULFATE (2.5 MG/3ML) 0.083% IN NEBU
2.50 | INHALATION_SOLUTION | RESPIRATORY_TRACT | Status: DC
Start: ? — End: 2018-12-11

## 2018-12-11 MED ORDER — INSULIN GLARGINE 100 UNIT/ML ~~LOC~~ SOLN
1.00 | SUBCUTANEOUS | Status: DC
Start: ? — End: 2018-12-11

## 2018-12-11 MED ORDER — SODIUM CHLORIDE 0.9 % IV SOLN
10.00 | INTRAVENOUS | Status: DC
Start: ? — End: 2018-12-11

## 2018-12-11 MED ORDER — DICYCLOMINE HCL 10 MG/5ML PO SOLN
2.50 | ORAL | Status: DC
Start: 2018-12-10 — End: 2018-12-11

## 2018-12-11 MED ORDER — GENERIC EXTERNAL MEDICATION
0.08 | Status: DC
Start: ? — End: 2018-12-11

## 2018-12-11 NOTE — Progress Notes (Signed)
Protable EEG completed at [facility], results pending.

## 2018-12-11 NOTE — Consult Note (Signed)
Pulmonary [place]  Date of Service: 12/11/2018  PULMONARY CRITICAL CARE CONSULT   [name] Fitzpatrick  [id number]  DOB: 08-16-1955   DOA: 12/10/2018  Referring Physician: Merton Border, MD  HPI: [name] Fitzpatrick is a 63 y.o. female seen for follow up of Acute on Chronic Respiratory Failure.  Patient has multiple medical problems including hyperlipidemia depression anxiety hypothyroidism COPD irritable bowel chronic back pain and neuropathy who presented to the hospital because of increasing shortness of breath.  Patient was found to have a cough which was essentially nonproductive also was spiking fevers up to 104 prior to admission.  She was evaluated initially in the ED was tested for COVID-19 which was negative.  CT scan of the chest was done which however showed bilateral groundglass opacities.  The patient hospital course was as follows.  She was tested second time for SARS and this time again she was negative.  The patient had a BNP of 218 severely hypoxic and patient also was noted to have significant hypotension.  Patient did have a low cortisol level was given Solu-Cortef and also midodrine for low blood pressure.  The patient was started on treatment for pneumonia.  She was initially started on high flow oxygen with really no improvement transfer to the ICU with increased work of breathing and was eventually intubated.  The patient also had bilateral Dopplers done and this revealed lower extremity DVT on the left side.  Patient was sedated with fentanyl as well as ketamine.  Other work-up included echocardiogram which showed normal ejection fraction.  She also did have a bronchoscopy done for increased secretions.  She was ended up extubated however had worsening of her hypoxia once again and ended a reintubated on July 19.  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   Patient also underwent a cardiac catheterization and Swan placement.  Was found to have mild pulmonary hypertension normal cardiac outputs.  The Swan use more elastic guide fluid therapy with her failure to improve it was decided that she would be transferred to an LTAC and she was brought here for further management and weaning.  Review of Systems:  ROS performed and is unremarkable other than noted above.  Past Medical History:  Diagnosis Date  . Anxiety   . Chronic LBP   . Chronic neck pain   . COPD (chronic obstructive pulmonary disease) ([place])   . Depression   . DM type 2 (diabetes mellitus, type 2) ([place])   . Fibromyalgia   . GERD (gastroesophageal reflux disease)   . H/O suicide attempt   . Hashimoto's thyroiditis   . Hypertension   . Hypothyroidism   . IBS (irritable bowel syndrome)    with constipation  . Migraine headache   . Osteoarthritis   . Thyroiditis, 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   had total thyroidectomy 11/30/2013 at [facility]    Past Surgical History:  Procedure Laterality Date  . ABDOMINAL HYSTERECTOMY    . b/l foot surgery --bunions    . BIOPSY  07/29/2015   Procedure: BIOPSY;  Surgeon: Rogene Houston, MD;  Location: [facility];  Service: Endoscopy;;  Duodenal,  gastric, and esophageal  biopsies  . C-Spine fusion  1991, 2004  . CHOLECYSTECTOMY    . COLONOSCOPY WITH PROPOFOL N/A 07/29/2015   Procedure: COLONOSCOPY WITH PROPOFOL;  Surgeon: Rogene Houston, MD;  Location: [facility];  Service: Endoscopy;  Laterality: N/A;  8:30  . ESOPHAGEAL DILATION N/A 07/29/2015   Procedure: ESOPHAGEAL DILATION;  Surgeon: Rogene Houston, MD;  Location: [facility];  Service: Endoscopy;  Laterality: N/A;  . ESOPHAGOGASTRODUODENOSCOPY (EGD) WITH PROPOFOL N/A 07/29/2015   Procedure: ESOPHAGOGASTRODUODENOSCOPY (EGD) WITH PROPOFOL;  Surgeon: Rogene Houston, MD;  Location: [facility];  Service: Endoscopy;  Laterality: N/A;  . l carpal tunnel release Right   . l spine fusion x 2     . reconstucted right ankle    . Right knee arthroscopic surgery    . total reconstruction of right ankle and heel     total of three   . VESICOVAGINAL FISTULA CLOSURE W/ TAH  1993   Fibroids no Ca    Social History:    reports that she quit smoking about 6 years ago. Her smoking use included cigarettes. She has a 12.00 pack-year smoking history. She does not have any smokeless tobacco history on file. She reports current drug use. She reports that she does not drink alcohol.  Family History: Non-Contributory to the present illness  Allergies  Allergen Reactions  . Prednisone Shortness Of Breath and Nausea And Vomiting  . Penicillins Other (See Comments)    Stroke-like symptoms Has patient had a PCN reaction causing immediate rash, facial/tongue/throat swelling, SOB or lightheadedness with hypotension: YES Has patient had a PCN reaction causing severe rash involving mucus membranes or skin necrosis: No Has patient had a PCN reaction that required hospitalization: No Has patient had a PCN reaction occurring within the last 10 years: Yes If all of the above answers are "NO", then may proceed with Cephalosporin use.   . Cephalexin Hives, Swelling and Rash  . Latex Rash and Other (See Comments)    Causes skin to tear easily  . Metoclopramide Hcl Hives and Rash    Medications: Reviewed on Rounds  Physical Exam:  Vitals: Temperature 98.3 pulse 109 respiratory rate 26 blood pressure 148/40 saturations 98%  Ventilator Settings mode ventilation pressure assist control FiO2 50% tidal volume 483 PEEP 5  . General: Comfortable at this time . Eyes: Grossly normal lids, irises & conjunctiva . ENT: grossly tongue is normal . Neck: no obvious mass . Cardiovascular: S1-S2 normal no gallop or rub is noted . Respiratory: Few scattered distant rhonchi . Abdomen: Soft nontender . Skin: no rash seen on limited exam . Musculoskeletal: not rigid . Psychiatric:unable to  assess . Neurologic: no seizure no involuntary movements         Labs on Admission:  Basic Metabolic Panel: Recent Labs  Lab 12/11/18 0525  NA 139  K 3.7  CL 97*  CO2 29  GLUCOSE 145*  BUN 19  CREATININE 0.42*  CALCIUM 9.8    Recent Labs  Lab 12/11/18 0937  PHART 7.341*  PCO2ART 57.9*  PO2ART 101  HCO3 30.5*  O2SAT 95.5    Liver Function Tests: No results for input(s): AST, ALT, ALKPHOS, BILITOT, PROT, ALBUMIN in the last 168 hours. No results for input(s): LIPASE, AMYLASE in the last 168 hours. No results for input(s): AMMONIA in the last 168 hours.  CBC: Recent Labs  Lab 12/11/18 0525  WBC 21.4*  HGB 10.9*  HCT 36.7  MCV 81.7  PLT 625*    Cardiac Enzymes: No results for input(s): CKTOTAL, CKMB, CKMBINDEX, TROPONINI in the last 168 hours.  BNP (last 3 results) No results for input(s): BNP in the last 8760 hours.  ProBNP (last 3 results) No results for input(s):  PROBNP in the last 8760 hours.   Radiological Exams on Admission: Ct Head Wo Contrast  Result Date: 12/11/2018 CLINICAL DATA:  Encephalopathy. EXAM: CT HEAD WITHOUT CONTRAST TECHNIQUE: Contiguous axial images were obtained from the base of the skull through the vertex without intravenous contrast. COMPARISON:  CT scan of July 01, 2011. FINDINGS: Brain: No evidence of acute infarction, hemorrhage, hydrocephalus, extra-axial collection or mass lesion/mass effect. Vascular: No hyperdense vessel or unexpected calcification. Skull: Normal. Negative for fracture or focal lesion. Sinuses/Orbits: No acute finding. Other: None. IMPRESSION: Normal head CT. Electronically Signed   By: Marijo Conception M.D.   On: 12/11/2018 10:21   Dg Abdomen Peg Tube Location  Result Date: 12/10/2018 CLINICAL DATA:  Peg placement EXAM: ABDOMEN - 1 VIEW COMPARISON:  None. FINDINGS: Peg tube projects over the left abdomen. Contrast injected through the PEG tube seen within the stomach. No visible contrast extravasation. No  free air or bowel obstruction. IVC filter in place. Postoperative changes in the lumbar spine. IMPRESSION: Peg tube within the stomach.  No contrast extravasation. Electronically Signed   By: Rolm Baptise M.D.   On: 12/10/2018 22:30    Assessment/Plan Active Problems:   Acute on chronic respiratory failure with hypoxia (HCC)   Seizure disorder (HCC)   Acute deep vein thrombosis (DVT) of left femoral vein (HCC)   Interstitial pneumonia (HCC)   ARDS (adult respiratory distress syndrome) ([place])   1. Acute on chronic respiratory failure with hypoxia patient right now is on full vent support apparently was having some issues with seizures which now have stopped will hold off on weaning today reassess again tomorrow check the RSB I and advance weaning. 2. Seizure disorder patient had a seizure apparently this morning felt to be due to possible withdrawal of some medications will continue to monitor closely. 3. Acute DVT of left femoral patient has been treated with anticoagulation we will continue with supportive care. 4. Interstitial lung disease felt to be viral pneumonia the last scan had shown diffuse bilateral changes. 5. ARDS we will continue with supportive care patient was tested for COVID-19 and negative multiple times  I have personally seen and evaluated the patient, evaluated laboratory and imaging results, formulated the assessment and plan and placed orders. The Patient requires high complexity decision making for assessment and support.  Case was discussed on Rounds with the Respiratory Therapy Staff Time Spent 37minutes  Allyne Gee, MD [facility] Pulmonary Critical Care Medicine Sleep Medicine

## 2018-12-12 DIAGNOSIS — J80 Acute respiratory distress syndrome: Secondary | ICD-10-CM | POA: Diagnosis not present

## 2018-12-12 DIAGNOSIS — I82412 Acute embolism and thrombosis of left femoral vein: Secondary | ICD-10-CM | POA: Diagnosis not present

## 2018-12-12 DIAGNOSIS — J9621 Acute and chronic respiratory failure with hypoxia: Secondary | ICD-10-CM | POA: Diagnosis not present

## 2018-12-12 DIAGNOSIS — J849 Interstitial pulmonary disease, unspecified: Secondary | ICD-10-CM | POA: Diagnosis not present

## 2018-12-12 LAB — BASIC METABOLIC PANEL
Anion gap: 9 (ref 5–15)
BUN: 11 mg/dL (ref 8–23)
CO2: 30 mmol/L (ref 22–32)
Calcium: 9.4 mg/dL (ref 8.9–10.3)
Chloride: 101 mmol/L (ref 98–111)
Creatinine, Ser: 0.33 mg/dL — ABNORMAL LOW (ref 0.44–1.00)
GFR calc Af Amer: >60 mL/min (ref >60)
GFR calc non Af Amer: >60 mL/min (ref >60)
Glucose, Bld: 130 mg/dL — ABNORMAL HIGH (ref 70–99)
Potassium: 3.8 mmol/L (ref 3.5–5.1)
Sodium: 140 mmol/L (ref 135–145)

## 2018-12-12 LAB — CBC
HCT: 32.3 % — ABNORMAL LOW (ref 36.0–46.0)
Hemoglobin: 9.6 g/dL — ABNORMAL LOW (ref 12.0–15.0)
MCH: 24.2 pg — ABNORMAL LOW (ref 26.0–34.0)
MCHC: 29.7 g/dL — ABNORMAL LOW (ref 30.0–36.0)
MCV: 81.6 fL (ref 80.0–100.0)
Platelets: 425 10*3/uL — ABNORMAL HIGH (ref 150–400)
RBC: 3.96 MIL/uL (ref 3.87–5.11)
RDW: 16.3 % — ABNORMAL HIGH (ref 11.5–15.5)
WBC: 12.7 10*3/uL — ABNORMAL HIGH (ref 4.0–10.5)
nRBC: 0 % (ref 0.0–0.2)

## 2018-12-12 LAB — MAGNESIUM: Magnesium: 1.8 mg/dL (ref 1.7–2.4)

## 2018-12-12 NOTE — Procedures (Signed)
Patient Name: [name] Fitzpatrick  MRN: [id number]  Epilepsy Attending: Lora Havens  Referring Physician/Provider: Janora Norlander, NP Date: 12/11/2018 Duration: 26.30 minutes  Patient history: 63 year old female with seizure disorder.  EEG to evaluate for seizure.  Level of alertness: awake, lethargic  AEDs during EEG study: Valium  Technical aspects: This EEG study was done with scalp electrodes positioned according to the 10-20 International system of electrode placement. Electrical activity was acquired at a sampling rate of 500Hz  and reviewed with a high frequency filter of 70Hz  and a low frequency filter of 1Hz . EEG data were recorded continuously and digitally stored.    DESCRIPTION: EEG showed continuous slow 5-6Hz  theta slowing admixed with an excessive amount of 15 to 18 Hz, 2-3 uV beta activity with irregular morphology distributed symmetrically and diffusely. No clear background rhythm was seen. Hyperventilation and photic stimulation were not performed.   IMPRESSION: This study is within normal limits. No seizures or epileptiform discharges were seen throughout the recording.  The excessive beta activity seen in the background is most likely due to the effect of benzodiazepine and is a benign EEG pattern.  Shemeka Wardle Barbra Sarks

## 2018-12-12 NOTE — Progress Notes (Addendum)
Pulmonary Critical Care Medicine [place]   PULMONARY CRITICAL CARE SERVICE  PROGRESS NOTE  Date of Service: 12/12/2018  [name] Fitzpatrick  [id number]  DOB: 12-06-1955   DOA: 12/10/2018  Referring Physician: Merton Border, MD  HPI: [name] Fitzpatrick is a 63 y.o. female seen for follow up of Acute on Chronic Respiratory Failure.  Patient continues on pressure support 12/5 and FiO2 40% for goal 2 hours today and will be placed back on assist control pressure control mode.  Currently satting well with no distress.  Medications: Reviewed on Rounds  Physical Exam:  Vitals: Pulse 101 respirations 21 BP 139/70 O2 sat 98% temp 98.1  Ventilator Settings per support 12/5 FiO2 40%  . General: Comfortable at this time . Eyes: Grossly normal lids, irises & conjunctiva . ENT: grossly tongue is normal . Neck: no obvious mass . Cardiovascular: S1 S2 normal no gallop . Respiratory: No rales or rhonchi noted . Abdomen: soft . Skin: no rash seen on limited exam . Musculoskeletal: not rigid . Psychiatric:unable to assess . Neurologic: no seizure no involuntary movements         Lab Data:   Basic Metabolic Panel: Recent Labs  Lab 12/11/18 0525 12/12/18 0604  NA 139 140  K 3.7 3.8  CL 97* 101  CO2 29 30  GLUCOSE 145* 130*  BUN 19 11  CREATININE 0.42* 0.33*  CALCIUM 9.8 9.4  MG  --  1.8    ABG: Recent Labs  Lab 12/11/18 0937  PHART 7.341*  PCO2ART 57.9*  PO2ART 101  HCO3 30.5*  O2SAT 95.5    Liver Function Tests: No results for input(s): AST, ALT, ALKPHOS, BILITOT, PROT, ALBUMIN in the last 168 hours. No results for input(s): LIPASE, AMYLASE in the last 168 hours. No results for input(s): AMMONIA in the last 168 hours.  CBC: Recent Labs  Lab 12/11/18 0525 12/12/18 0943  WBC 21.4* 12.7*  HGB 10.9* 9.6*  HCT 36.7 32.3*  MCV 81.7 81.6  PLT 625* 425*    Cardiac Enzymes: No results for input(s): CKTOTAL, CKMB, CKMBINDEX, TROPONINI in  the last 168 hours.  BNP (last 3 results) No results for input(s): BNP in the last 8760 hours.  ProBNP (last 3 results) No results for input(s): PROBNP in the last 8760 hours.  Radiological Exams: Ct Head Wo Contrast  Result Date: 12/11/2018 CLINICAL DATA:  Encephalopathy. EXAM: CT HEAD WITHOUT CONTRAST TECHNIQUE: Contiguous axial images were obtained from the base of the skull through the vertex without intravenous contrast. COMPARISON:  CT scan of July 01, 2011. FINDINGS: Brain: No evidence of acute infarction, hemorrhage, hydrocephalus, extra-axial collection or mass lesion/mass effect. Vascular: No hyperdense vessel or unexpected calcification. Skull: Normal. Negative for fracture or focal lesion. Sinuses/Orbits: No acute finding. Other: None. IMPRESSION: Normal head CT. Electronically Signed   By: Marijo Conception M.D.   On: 12/11/2018 10:21   Dg Abdomen Peg Tube Location  Result Date: 12/10/2018 CLINICAL DATA:  Peg placement EXAM: ABDOMEN - 1 VIEW COMPARISON:  None. FINDINGS: Peg tube projects over the left abdomen. Contrast injected through the PEG tube seen within the stomach. No visible contrast extravasation. No free air or bowel obstruction. IVC filter in place. Postoperative changes in the lumbar spine. IMPRESSION: Peg tube within the stomach.  No contrast extravasation. Electronically Signed   By: Rolm Baptise M.D.   On: 12/10/2018 22:30   Dg Chest Port 1 View  Result Date: 12/11/2018 CLINICAL DATA:  63 year old female with  history of pneumonia on a ventilator. EXAM: PORTABLE CHEST 1 VIEW COMPARISON:  Chest x-ray 04/04/2018. FINDINGS: A tracheostomy tube is in place with tip 3.1 cm above the carina. Diffuse ill-defined opacities and areas of interstitial prominence throughout the lungs bilaterally. Bibasilar opacities also likely reflect areas of subsegmental atelectasis. No definite pleural effusions. Pulmonary vasculature is obscured. Heart size appears borderline enlarged. The  patient is rotated to the right on today's exam, resulting in distortion of the mediastinal contours and reduced diagnostic sensitivity and specificity for mediastinal pathology. IMPRESSION: 1. Support apparatus, as above. 2. Low lung volumes with probable multilobar bronchopneumonia. The possibility of congestive heart failure is not excluded, but not strongly favored on the basis of today's examination. Electronically Signed   By: Vinnie Langton M.D.   On: 12/11/2018 14:32    Assessment/Plan Active Problems:   Acute on chronic respiratory failure with hypoxia (HCC)   Seizure disorder (HCC)   Acute deep vein thrombosis (DVT) of left femoral vein (HCC)   Interstitial pneumonia (HCC)   ARDS (adult respiratory distress syndrome) ([place])   1. Acute on chronic respiratory failure with hypoxia patient is weaning from full support today to pressure support 12/5 with an FiO2 of 40% for goal of 2 hours.  Continue to wean per protocol continues with measures as tolerated. 2. Seizure disorder no seizures noted at this time continue take medicines and monitor closely. 3. Acute DVT of left middle patient is anticoagulated continue supportive care. 4. Interstitial lung disease possibly viral pneumonia continue supportive measures 5. ARDS continue supportive care patient's been negative for COVID-19 multiple times.   I have personally seen and evaluated the patient, evaluated laboratory and imaging results, formulated the assessment and plan and placed orders. The Patient requires high complexity decision making for assessment and support.  Case was discussed on Rounds with the Respiratory Therapy Staff  Allyne Gee, MD [facility] Pulmonary Critical Care Medicine Sleep Medicine

## 2018-12-13 DIAGNOSIS — J9621 Acute and chronic respiratory failure with hypoxia: Secondary | ICD-10-CM | POA: Diagnosis not present

## 2018-12-13 DIAGNOSIS — I82412 Acute embolism and thrombosis of left femoral vein: Secondary | ICD-10-CM | POA: Diagnosis not present

## 2018-12-13 DIAGNOSIS — J80 Acute respiratory distress syndrome: Secondary | ICD-10-CM | POA: Diagnosis not present

## 2018-12-13 DIAGNOSIS — J849 Interstitial pulmonary disease, unspecified: Secondary | ICD-10-CM | POA: Diagnosis not present

## 2018-12-13 NOTE — Progress Notes (Addendum)
Pulmonary Critical Care Medicine [place]   PULMONARY CRITICAL CARE SERVICE  PROGRESS NOTE  Date of Service: 12/13/2018  [name] Fitzpatrick  [id number]  DOB: 08-19-1955   DOA: 12/10/2018  Referring Physician: Merton Border, MD  HPI: [name] Fitzpatrick is a 63 y.o. female seen for follow up of Acute on Chronic Respiratory Failure.  Patient remains on pressure support 12 5 FiO2 of 40% for 4-hour goal today once completed back on full support on the ventilator pressure control mode rate of 14 and FiO2 of 40%.  Currently satting well with no distress.  Medications: Reviewed on Rounds  Physical Exam:  Vitals: Pulse 98 respiration 20 BP 132/74 O2 sat 98% temp 98.6  Ventilator Settings vital mode AC PC rate of 14 inspiratory pressure 14 PEEP of 5 and FiO2 of 40%  . General: Comfortable at this time . Eyes: Grossly normal lids, irises & conjunctiva . ENT: grossly tongue is normal . Neck: no obvious mass . Cardiovascular: S1 S2 normal no gallop . Respiratory: No rales or rhonchi noted . Abdomen: soft . Skin: no rash seen on limited exam . Musculoskeletal: not rigid . Psychiatric:unable to assess . Neurologic: no seizure no involuntary movements         Lab Data:   Basic Metabolic Panel: Recent Labs  Lab 12/11/18 0525 12/12/18 0604  NA 139 140  K 3.7 3.8  CL 97* 101  CO2 29 30  GLUCOSE 145* 130*  BUN 19 11  CREATININE 0.42* 0.33*  CALCIUM 9.8 9.4  MG  --  1.8    ABG: Recent Labs  Lab 12/11/18 0937  PHART 7.341*  PCO2ART 57.9*  PO2ART 101  HCO3 30.5*  O2SAT 95.5    Liver Function Tests: No results for input(s): AST, ALT, ALKPHOS, BILITOT, PROT, ALBUMIN in the last 168 hours. No results for input(s): LIPASE, AMYLASE in the last 168 hours. No results for input(s): AMMONIA in the last 168 hours.  CBC: Recent Labs  Lab 12/11/18 0525 12/12/18 0943  WBC 21.4* 12.7*  HGB 10.9* 9.6*  HCT 36.7 32.3*  MCV 81.7 81.6  PLT 625* 425*     Cardiac Enzymes: No results for input(s): CKTOTAL, CKMB, CKMBINDEX, TROPONINI in the last 168 hours.  BNP (last 3 results) No results for input(s): BNP in the last 8760 hours.  ProBNP (last 3 results) No results for input(s): PROBNP in the last 8760 hours.  Radiological Exams: No results found.  Assessment/Plan Active Problems:   Acute on chronic respiratory failure with hypoxia (HCC)   Seizure disorder (HCC)   Acute deep vein thrombosis (DVT) of left femoral vein (HCC)   Interstitial pneumonia (HCC)   ARDS (adult respiratory distress syndrome) ([place])   1. Acute on chronic respiratory failure with hypoxia patient was able to do 4 hours on pressure support is now back on full support on the ventilator will continue to wean per protocol.  Continue aggressive pulmonary toilet supportive measures. 2. Seizure disorder no seizures noted at this time continue take medicines and monitor closely. 3. Acute DVT of left middle patient is anticoagulated continue supportive care. 4. Interstitial lung disease possibly viral pneumonia continue supportive measures 5. ARDS continue supportive care patient's been negative for COVID-19 multiple times.   I have personally seen and evaluated the patient, evaluated laboratory and imaging results, formulated the assessment and plan and placed orders. The Patient requires high complexity decision making for assessment and support.  Case was discussed on Rounds with the Respiratory  Therapy Staff  Allyne Gee, MD [facility] Pulmonary Critical Care Medicine Sleep Medicine '

## 2018-12-13 NOTE — Progress Notes (Signed)
Attempted to get pt for MRI, spoke to RN who states pt is very restless right now and will not be able to complete the exam. RN to call when pt is ready.

## 2018-12-14 ENCOUNTER — Other Ambulatory Visit (HOSPITAL_COMMUNITY): Payer: Medicare HMO

## 2018-12-14 DIAGNOSIS — J849 Interstitial pulmonary disease, unspecified: Secondary | ICD-10-CM | POA: Diagnosis not present

## 2018-12-14 DIAGNOSIS — I82412 Acute embolism and thrombosis of left femoral vein: Secondary | ICD-10-CM | POA: Diagnosis not present

## 2018-12-14 DIAGNOSIS — J80 Acute respiratory distress syndrome: Secondary | ICD-10-CM | POA: Diagnosis not present

## 2018-12-14 DIAGNOSIS — J9621 Acute and chronic respiratory failure with hypoxia: Secondary | ICD-10-CM | POA: Diagnosis not present

## 2018-12-14 LAB — CULTURE, RESPIRATORY W GRAM STAIN

## 2018-12-14 NOTE — Progress Notes (Signed)
Attempted pt's MRI, there was a question about the reinforced trach tube not being MRI safe. Technologist looked up the trach and stated it was MRI conditional and could meet the conditions. Respiratory therapist was not comfortable with completing the exam and pt was returned to the floor.

## 2018-12-14 NOTE — Progress Notes (Addendum)
Pulmonary Critical Care [facility] [place]   PULMONARY CRITICAL CARE SERVICE  PROGRESS NOTE  Date of Service: 12/14/2018  [name] Fitzpatrick  [id number]  DOB: 15-Sep-1955   DOA: 12/10/2018  Referring Physician: Merton Border, [facility]  HPI: [name] Fitzpatrick is a 63 y.o. female seen for follow up of Acute on Chronic Respiratory Failure.  Patient was able to tolerate 3 hours today on pressure support 12/5 FiO2 35%.  Now back resting on full support ventilator presently rate 14 and FiO2 35%.  Medications: Reviewed on Rounds  Physical Exam:  Vitals: Pulse 95 respirations 22 BP 130/68 O2 sat 99% temp 98.3  Ventilator Settings ventilator mode AC PC rate of 14 inspiratory pressure 14 PEEP of 5 FiO2 35%  . General: Comfortable at this time . Eyes: Grossly normal lids, irises & conjunctiva . ENT: grossly tongue is normal . Neck: no obvious mass . Cardiovascular: S1 S2 normal no gallop . Respiratory: No rales or rhonchi noted . Abdomen: soft . Skin: no rash seen on limited exam . Musculoskeletal: not rigid . Psychiatric:unable to assess . Neurologic: no seizure no involuntary movements         Lab Data:   Basic Metabolic Panel: Recent Labs  Lab 12/11/18 0525 12/12/18 0604  NA 139 140  K 3.7 3.8  CL 97* 101  CO2 29 30  GLUCOSE 145* 130*  BUN 19 11  CREATININE 0.42* 0.33*  CALCIUM 9.8 9.4  MG  --  1.8    ABG: Recent Labs  Lab 12/11/18 0937  PHART 7.341*  PCO2ART 57.9*  PO2ART 101  HCO3 30.5*  O2SAT 95.5    Liver Function Tests: No results for input(s): AST, ALT, ALKPHOS, BILITOT, PROT, ALBUMIN in the last 168 hours. No results for input(s): LIPASE, AMYLASE in the last 168 hours. No results for input(s): AMMONIA in the last 168 hours.  CBC: Recent Labs  Lab 12/11/18 0525 12/12/18 0943  WBC 21.4* 12.7*  HGB 10.9* 9.6*  HCT 36.7 32.3*  MCV 81.7 81.6  PLT 625* 425*    Cardiac Enzymes: No results for input(s): CKTOTAL, CKMB,  CKMBINDEX, TROPONINI in the last 168 hours.  BNP (last 3 results) No results for input(s): BNP in the last 8760 hours.  ProBNP (last 3 results) No results for input(s): PROBNP in the last 8760 hours.  Radiological Exams: No results found.  Assessment/Plan Active Problems:   Acute on chronic respiratory failure with hypoxia (HCC)   Seizure disorder (HCC)   Acute deep vein thrombosis (DVT) of left femoral vein (HCC)   Interstitial pneumonia (HCC)   ARDS (adult respiratory distress syndrome) ([place])   1. Acute on chronic respiratory failure with hypoxia  patient can tolerate more than 3 hours on pressure support today so is back on full support on ventilator resting at this time we will continue to wean as tolerated protocol.  Continue aggressive pulmonary toilet supportive measures. 2. Seizure disorder no seizures noted at this time continue take medicines and monitor closely. 3. Acute DVT of left middle patient is anticoagulated continue supportive care. 4. Interstitial lung disease possibly viral pneumonia continue supportive measures 5. ARDS continue supportive care patient's been negative for COVID-19 multiple times.   I have personally seen and evaluated the patient, evaluated laboratory and imaging results, formulated the assessment and plan and placed orders. The Patient requires high complexity decision making for assessment and support.  Case was discussed on Rounds with the Respiratory Therapy Staff  [name] Gee, [facility]  [name] Fitz[facility]

## 2018-12-15 DIAGNOSIS — J849 Interstitial pulmonary disease, unspecified: Secondary | ICD-10-CM | POA: Diagnosis not present

## 2018-12-15 DIAGNOSIS — J9621 Acute and chronic respiratory failure with hypoxia: Secondary | ICD-10-CM | POA: Diagnosis not present

## 2018-12-15 DIAGNOSIS — J80 Acute respiratory distress syndrome: Secondary | ICD-10-CM | POA: Diagnosis not present

## 2018-12-15 DIAGNOSIS — I82412 Acute embolism and thrombosis of left femoral vein: Secondary | ICD-10-CM | POA: Diagnosis not present

## 2018-12-15 LAB — BASIC METABOLIC PANEL
Anion gap: 13 (ref 5–15)
BUN: 9 mg/dL (ref 8–23)
CO2: 30 mmol/L (ref 22–32)
Calcium: 9.5 mg/dL (ref 8.9–10.3)
Chloride: 97 mmol/L — ABNORMAL LOW (ref 98–111)
Creatinine, Ser: 0.36 mg/dL — ABNORMAL LOW (ref 0.44–1.00)
GFR calc Af Amer: >60 mL/min (ref >60)
GFR calc non Af Amer: >60 mL/min (ref >60)
Glucose, Bld: 106 mg/dL — ABNORMAL HIGH (ref 70–99)
Potassium: 3.9 mmol/L (ref 3.5–5.1)
Sodium: 140 mmol/L (ref 135–145)

## 2018-12-15 LAB — CBC
HCT: 33.1 % — ABNORMAL LOW (ref 36.0–46.0)
Hemoglobin: 10 g/dL — ABNORMAL LOW (ref 12.0–15.0)
MCH: 24.9 pg — ABNORMAL LOW (ref 26.0–34.0)
MCHC: 30.2 g/dL (ref 30.0–36.0)
MCV: 82.5 fL (ref 80.0–100.0)
Platelets: 389 10*3/uL (ref 150–400)
RBC: 4.01 MIL/uL (ref 3.87–5.11)
RDW: 16.8 % — ABNORMAL HIGH (ref 11.5–15.5)
WBC: 12.7 10*3/uL — ABNORMAL HIGH (ref 4.0–10.5)
nRBC: 0 % (ref 0.0–0.2)

## 2018-12-15 LAB — T4, FREE: Free T4: 1.77 ng/dL — ABNORMAL HIGH (ref 0.61–1.12)

## 2018-12-15 LAB — TSH: TSH: 1.255 u[IU]/mL (ref 0.350–4.500)

## 2018-12-15 LAB — MAGNESIUM: Magnesium: 1.7 mg/dL (ref 1.7–2.4)

## 2018-12-15 NOTE — Progress Notes (Addendum)
Pulmonary Critical Care Medicine [place]   PULMONARY CRITICAL CARE SERVICE  PROGRESS NOTE  Date of Service: 12/15/2018  [name] [facility]patrick  [id number]  DOB: 1955-10-01   DOA: 12/10/2018  Referring Physician: Merton Border, MD  HPI: [name] [facility] is a 63 y.o. female seen for follow up of Acute on Chronic Respiratory Failure.  Patient has a 4-hour goal today on pressure support 12/5 with FiO2 35%.  Currently satting well with no acute distress.  Medications: Reviewed on Rounds  Physical Exam:  Vitals: Pulse 81 respirations 25 BP 144/74 O2 sat 99% temp 7.6  Ventilator Settings are support 12 over 5 FiO2 35%.  . General: Comfortable at this time . Eyes: Grossly normal lids, irises & conjunctiva . ENT: grossly tongue is normal . Neck: no obvious mass . Cardiovascular: S1 S2 normal no gallop . Respiratory: No rales or rhonchi noted . Abdomen: soft . Skin: no rash seen on limited exam . Musculoskeletal: not rigid . Psychiatric:unable to assess . Neurologic: no seizure no involuntary movements         Lab Data:   Basic Metabolic Panel: Recent Labs  Lab 12/11/18 0525 12/12/18 0604 12/15/18 1002  NA 139 140 140  K 3.7 3.8 3.9  CL 97* 101 97*  CO2 29 30 30   GLUCOSE 145* 130* 106*  BUN 19 11 9   CREATININE 0.42* 0.33* 0.36*  CALCIUM 9.8 9.4 9.5  MG  --  1.8 1.7    ABG: Recent Labs  Lab 12/11/18 0937  PHART 7.341*  PCO2ART 57.9*  PO2ART 101  HCO3 30.5*  O2SAT 95.5    Liver Function Tests: No results for input(s): AST, ALT, ALKPHOS, BILITOT, PROT, ALBUMIN in the last 168 hours. No results for input(s): LIPASE, AMYLASE in the last 168 hours. No results for input(s): AMMONIA in the last 168 hours.  CBC: Recent Labs  Lab 12/11/18 0525 12/12/18 0943 12/15/18 1002  WBC 21.4* 12.7* 12.7*  HGB 10.9* 9.6* 10.0*  HCT 36.7 32.3* 33.1*  MCV 81.7 81.6 82.5  PLT 625* 425* 389    Cardiac Enzymes: No results for input(s): CKTOTAL,  CKMB, CKMBINDEX, TROPONINI in the last 168 hours.  BNP (last 3 results) No results for input(s): BNP in the last 8760 hours.  ProBNP (last 3 results) No results for input(s): PROBNP in the last 8760 hours.  Radiological Exams: No results found.  Assessment/Plan Active Problems:   Acute on chronic respiratory failure with hypoxia (HCC)   Seizure disorder (HCC)   Acute deep vein thrombosis (DVT) of left femoral vein (HCC)   Interstitial pneumonia (HCC)   ARDS (adult respiratory distress syndrome) ([place])   1. Acute on chronic respiratory failure with hypoxiapatient will continue to wean on pressure support for the allotted 4 hours.  And then get back to ventilator full support.  Continue aggressive pulmonary toilet and supportive measures.  Continue to wean per protocol. 2. Seizure disorder no seizures noted at this time continue take medicines and monitor closely. 3. Acute DVT of left middle patient is anticoagulated continue supportive care. 4. Interstitial lung disease possibly viral pneumonia continue supportive measures 5. ARDS continue supportive care patient's been negative for COVID-19 multiple times.   I have personally seen and evaluated the patient, evaluated laboratory and imaging results, formulated the assessment and plan and placed orders. The Patient requires high complexity decision making for assessment and support.  Case was discussed on Rounds with the Respiratory Therapy Staff  Allyne Gee, MD [facility]  Pulmonary Critical Care Medicine Sleep Medicine

## 2018-12-16 DIAGNOSIS — J9621 Acute and chronic respiratory failure with hypoxia: Secondary | ICD-10-CM | POA: Diagnosis not present

## 2018-12-16 DIAGNOSIS — I82412 Acute embolism and thrombosis of left femoral vein: Secondary | ICD-10-CM | POA: Diagnosis not present

## 2018-12-16 DIAGNOSIS — J849 Interstitial pulmonary disease, unspecified: Secondary | ICD-10-CM | POA: Diagnosis not present

## 2018-12-16 DIAGNOSIS — J80 Acute respiratory distress syndrome: Secondary | ICD-10-CM | POA: Diagnosis not present

## 2018-12-16 LAB — CULTURE, BLOOD (ROUTINE X 2)
Culture: NO GROWTH
Culture: NO GROWTH
Special Requests: ADEQUATE

## 2018-12-16 LAB — LEVETIRACETAM LEVEL: Levetiracetam Lvl: 26.1 ug/mL (ref 10.0–40.0)

## 2018-12-16 LAB — MAGNESIUM: Magnesium: 1.8 mg/dL (ref 1.7–2.4)

## 2018-12-16 NOTE — Progress Notes (Signed)
Pulmonary Critical Care Medicine [place]   PULMONARY CRITICAL CARE SERVICE  PROGRESS NOTE  Date of Service: 12/16/2018  [name] [facility]  [id number]  DOB: [birth year]   DOA: 12/10/2018  Referring Physician: Merton Border, MD  HPI: [name] [facility] is a 63 y.o. female seen for follow up of Acute on Chronic Respiratory Failure.  Patient is currently on pressure support mode has been on 35% FiO2 with a goal of 8 hours  Medications: Reviewed on Rounds  Physical Exam:  Vitals: Temperature 98.4 pulse 111 respiratory rate 24 blood pressure 121/61 saturations 98%  Ventilator Settings mode ventilation pressure support FiO2 35% tidal volume is 500 pressure support 12 PEEP 5  . General: Comfortable at this time . Eyes: Grossly normal lids, irises & conjunctiva . ENT: grossly tongue is normal . Neck: no obvious mass . Cardiovascular: S1 S2 normal no gallop . Respiratory: Coarse breath sounds noted bilaterally . Abdomen: soft . Skin: no rash seen on limited exam . Musculoskeletal: not rigid . Psychiatric:unable to assess . Neurologic: no seizure no involuntary movements         Lab Data:   Basic Metabolic Panel: Recent Labs  Lab 12/11/18 0525 12/12/18 0604 12/15/18 1002 12/16/18 0510  NA 139 140 140  --   K 3.7 3.8 3.9  --   CL 97* 101 97*  --   CO2 29 30 30   --   GLUCOSE 145* 130* 106*  --   BUN 19 11 9   --   CREATININE 0.42* 0.33* 0.36*  --   CALCIUM 9.8 9.4 9.5  --   MG  --  1.8 1.7 1.8    ABG: Recent Labs  Lab 12/11/18 0937  PHART 7.341*  PCO2ART 57.9*  PO2ART 101  HCO3 30.5*  O2SAT 95.5    Liver Function Tests: No results for input(s): AST, ALT, ALKPHOS, BILITOT, PROT, ALBUMIN in the last 168 hours. No results for input(s): LIPASE, AMYLASE in the last 168 hours. No results for input(s): AMMONIA in the last 168 hours.  CBC: Recent Labs  Lab 12/11/18 0525 12/12/18 0943 12/15/18 1002  WBC 21.4* 12.7* 12.7*  HGB 10.9*  9.6* 10.0*  HCT 36.7 32.3* 33.1*  MCV 81.7 81.6 82.5  PLT 625* 425* 389    Cardiac Enzymes: No results for input(s): CKTOTAL, CKMB, CKMBINDEX, TROPONINI in the last 168 hours.  BNP (last 3 results) No results for input(s): BNP in the last 8760 hours.  ProBNP (last 3 results) No results for input(s): PROBNP in the last 8760 hours.  Radiological Exams: No results found.  Assessment/Plan Active Problems:   Acute on chronic respiratory failure with hypoxia (HCC)   Seizure disorder (HCC)   Acute deep vein thrombosis (DVT) of left femoral vein (HCC)   Interstitial pneumonia (HCC)   ARDS (adult respiratory distress syndrome) ([place])   1. Acute on chronic respiratory failure with hypoxia we will continue with weaning as ordered the goal today is for 8 hours on pressure support so far the patient appears to be tolerating it well with good tidal volumes. 2. Seizure disorder no active seizure noted at this time we will continue with supportive care 3. DVT treated 4. Interstitial pneumonia we will continue to follow 5. ARDS slowly improving   I have personally seen and evaluated the patient, evaluated laboratory and imaging results, formulated the assessment and plan and placed orders. The Patient requires high complexity decision making for assessment and support.  Case was discussed on Rounds  with the Respiratory Therapy Staff  Allyne Gee, MD [facility] Pulmonary Critical Care Medicine Sleep Medicine

## 2018-12-17 DIAGNOSIS — J9621 Acute and chronic respiratory failure with hypoxia: Secondary | ICD-10-CM | POA: Diagnosis not present

## 2018-12-17 DIAGNOSIS — I82412 Acute embolism and thrombosis of left femoral vein: Secondary | ICD-10-CM | POA: Diagnosis not present

## 2018-12-17 DIAGNOSIS — J849 Interstitial pulmonary disease, unspecified: Secondary | ICD-10-CM | POA: Diagnosis not present

## 2018-12-17 DIAGNOSIS — J80 Acute respiratory distress syndrome: Secondary | ICD-10-CM | POA: Diagnosis not present

## 2018-12-17 LAB — BASIC METABOLIC PANEL
Anion gap: 11 (ref 5–15)
BUN: 8 mg/dL (ref 8–23)
CO2: 31 mmol/L (ref 22–32)
Calcium: 9.6 mg/dL (ref 8.9–10.3)
Chloride: 100 mmol/L (ref 98–111)
Creatinine, Ser: 0.36 mg/dL — ABNORMAL LOW (ref 0.44–1.00)
GFR calc Af Amer: >60 mL/min (ref >60)
GFR calc non Af Amer: >60 mL/min (ref >60)
Glucose, Bld: 130 mg/dL — ABNORMAL HIGH (ref 70–99)
Potassium: 4 mmol/L (ref 3.5–5.1)
Sodium: 142 mmol/L (ref 135–145)

## 2018-12-17 LAB — PHOSPHORUS: Phosphorus: 4.4 mg/dL (ref 2.5–4.6)

## 2018-12-17 LAB — CBC
HCT: 36.1 % (ref 36.0–46.0)
Hemoglobin: 10.6 g/dL — ABNORMAL LOW (ref 12.0–15.0)
MCH: 24 pg — ABNORMAL LOW (ref 26.0–34.0)
MCHC: 29.4 g/dL — ABNORMAL LOW (ref 30.0–36.0)
MCV: 81.7 fL (ref 80.0–100.0)
Platelets: 431 10*3/uL — ABNORMAL HIGH (ref 150–400)
RBC: 4.42 MIL/uL (ref 3.87–5.11)
RDW: 16.9 % — ABNORMAL HIGH (ref 11.5–15.5)
WBC: 12.3 10*3/uL — ABNORMAL HIGH (ref 4.0–10.5)
nRBC: 0 % (ref 0.0–0.2)

## 2018-12-17 LAB — MAGNESIUM: Magnesium: 2 mg/dL (ref 1.7–2.4)

## 2018-12-17 NOTE — Progress Notes (Signed)
Pulmonary Critical Care Medicine [place]   PULMONARY CRITICAL CARE SERVICE  PROGRESS NOTE  Date of Service: 12/17/2018  [name] [facility]  [id number]  DOB: 1956-03-05   DOA: 12/10/2018  Referring Physician: Merton Border, MD  HPI: [name] [facility] is a 63 y.o. female seen for follow up of Acute on Chronic Respiratory Failure.  Patient currently is on pressure support mode doing fairly well has been on 40% FiO2 with good saturations.  Good tidal volumes are noted  Medications: Reviewed on Rounds  Physical Exam:  Vitals: Temperature 99.7 pulse 99 respiratory rate 22 blood pressure 115/65 saturations 100%  Ventilator Settings mode ventilation is pressure support FiO2 40% pressure 12 PEEP 5 tidal volume 454  . General: Comfortable at this time . Eyes: Grossly normal lids, irises & conjunctiva . ENT: grossly tongue is normal . Neck: no obvious mass . Cardiovascular: S1 S2 normal no gallop . Respiratory: No rhonchi coarse breath sounds are noted . Abdomen: soft . Skin: no rash seen on limited exam . Musculoskeletal: not rigid . Psychiatric:unable to assess . Neurologic: no seizure no involuntary movements         Lab Data:   Basic Metabolic Panel: Recent Labs  Lab 12/11/18 0525 12/12/18 0604 12/15/18 1002 12/16/18 0510 12/17/18 0517  NA 139 140 140  --  142  K 3.7 3.8 3.9  --  4.0  CL 97* 101 97*  --  100  CO2 29 30 30   --  31  GLUCOSE 145* 130* 106*  --  130*  BUN 19 11 9   --  8  CREATININE 0.42* 0.33* 0.36*  --  0.36*  CALCIUM 9.8 9.4 9.5  --  9.6  MG  --  1.8 1.7 1.8 2.0  PHOS  --   --   --   --  4.4    ABG: Recent Labs  Lab 12/11/18 0937  PHART 7.341*  PCO2ART 57.9*  PO2ART 101  HCO3 30.5*  O2SAT 95.5    Liver Function Tests: No results for input(s): AST, ALT, ALKPHOS, BILITOT, PROT, ALBUMIN in the last 168 hours. No results for input(s): LIPASE, AMYLASE in the last 168 hours. No results for input(s): AMMONIA in the  last 168 hours.  CBC: Recent Labs  Lab 12/11/18 0525 12/12/18 0943 12/15/18 1002 12/17/18 0517  WBC 21.4* 12.7* 12.7* 12.3*  HGB 10.9* 9.6* 10.0* 10.6*  HCT 36.7 32.3* 33.1* 36.1  MCV 81.7 81.6 82.5 81.7  PLT 625* 425* 389 431*    Cardiac Enzymes: No results for input(s): CKTOTAL, CKMB, CKMBINDEX, TROPONINI in the last 168 hours.  BNP (last 3 results) No results for input(s): BNP in the last 8760 hours.  ProBNP (last 3 results) No results for input(s): PROBNP in the last 8760 hours.  Radiological Exams: No results found.  Assessment/Plan Active Problems:   Acute on chronic respiratory failure with hypoxia (HCC)   Seizure disorder (HCC)   Acute deep vein thrombosis (DVT) of left femoral vein (HCC)   Interstitial pneumonia (HCC)   ARDS (adult respiratory distress syndrome) ([place])   1. Acute on chronic respiratory failure with hypoxia plan is going to be to continue to advance the wean as tolerated on pressure support.  Titrate oxygen continue pulmonary toilet.  Titrate down on the FiO2 from 40% as the patient right now has excellent saturations. 2. Seizure disorder no active seizure noted we will continue with supportive care. 3. Acute DVT treated we will continue to follow along 4.  Interstitial pneumonia we will continue with supportive care. 5. ARDS slowly improving patient has been tolerating weaning better   I have personally seen and evaluated the patient, evaluated laboratory and imaging results, formulated the assessment and plan and placed orders. The Patient requires high complexity decision making for assessment and support.  Case was discussed on Rounds with the Respiratory Therapy Staff  Allyne Gee, MD [facility] Pulmonary Critical Care Medicine Sleep Medicine

## 2018-12-18 DIAGNOSIS — J849 Interstitial pulmonary disease, unspecified: Secondary | ICD-10-CM | POA: Diagnosis not present

## 2018-12-18 DIAGNOSIS — J80 Acute respiratory distress syndrome: Secondary | ICD-10-CM | POA: Diagnosis not present

## 2018-12-18 DIAGNOSIS — I82412 Acute embolism and thrombosis of left femoral vein: Secondary | ICD-10-CM | POA: Diagnosis not present

## 2018-12-18 DIAGNOSIS — J9621 Acute and chronic respiratory failure with hypoxia: Secondary | ICD-10-CM | POA: Diagnosis not present

## 2018-12-18 NOTE — Progress Notes (Addendum)
Pulmonary Critical Care Medicine [place]   PULMONARY CRITICAL CARE SERVICE  PROGRESS NOTE  Date of Service: 12/18/2018  LAPARIS WALLOCH  [id number]  DOB: September 04, 1955   DOA: 12/10/2018  Referring Physician: Merton Border, MD  HPI: [name] [facility]patrick is a 63 y.o. female seen for follow up of Acute on Chronic Respiratory Failure.  Patient is a 63 year old female pressure support over 5 and FiO2 40% currently satting well no fever or distress.  Medications: Reviewed on Rounds  Physical Exam:  Vitals: Pulse 92 respirations 28 BP 95/59 O2 sat 99% temp 98.4  Ventilator Settings pressure support 12/5 FiO2 40%  . General: Comfortable at this time . Eyes: Grossly normal lids, irises & conjunctiva . ENT: grossly tongue is normal . Neck: no obvious mass . Cardiovascular: S1 S2 normal no gallop . Respiratory: No rales or rhonchi noted . Abdomen: soft . Skin: no rash seen on limited exam . Musculoskeletal: not rigid . Psychiatric:unable to assess . Neurologic: no seizure no involuntary movements         Lab Data:   Basic Metabolic Panel: Recent Labs  Lab 12/12/18 0604 12/15/18 1002 12/16/18 0510 12/17/18 0517  NA 140 140  --  142  K 3.8 3.9  --  4.0  CL 101 97*  --  100  CO2 30 30  --  31  GLUCOSE 130* 106*  --  130*  BUN 11 9  --  8  CREATININE 0.33* 0.36*  --  0.36*  CALCIUM 9.4 9.5  --  9.6  MG 1.8 1.7 1.8 2.0  PHOS  --   --   --  4.4    ABG: No results for input(s): PHART, PCO2ART, PO2ART, HCO3, O2SAT in the last 168 hours.  Liver Function Tests: No results for input(s): AST, ALT, ALKPHOS, BILITOT, PROT, ALBUMIN in the last 168 hours. No results for input(s): LIPASE, AMYLASE in the last 168 hours. No results for input(s): AMMONIA in the last 168 hours.  CBC: Recent Labs  Lab 12/12/18 0943 12/15/18 1002 12/17/18 0517  WBC 12.7* 12.7* 12.3*  HGB 9.6* 10.0* 10.6*  HCT 32.3* 33.1* 36.1  MCV 81.6 82.5 81.7  PLT 425* 389 431*     Cardiac Enzymes: No results for input(s): CKTOTAL, CKMB, CKMBINDEX, TROPONINI in the last 168 hours.  BNP (last 3 results) No results for input(s): BNP in the last 8760 hours.  ProBNP (last 3 results) No results for input(s): PROBNP in the last 8760 hours.  Radiological Exams: No results found.  Assessment/Plan Active Problems:   Acute on chronic respiratory failure with hypoxia (HCC)   Seizure disorder (HCC)   Acute deep vein thrombosis (DVT) of left femoral vein (HCC)   Interstitial pneumonia (HCC)   ARDS (adult respiratory distress syndrome) ([place])   1. Acute on chronic respiratory failure with hypoxia patient currently doing well 40% FiO2 on pressure support 12/5 16-hour goal at this time.  Will continue aggressive pulmonary toilet measures. 2. Seizure disorder no active seizure noted we will continue with supportive care. 3. Acute DVT treated we will continue to follow along 4. Interstitial pneumonia we will continue with supportive care. 5. ARDS slowly improving patient has been tolerating weaning better   I have personally seen and evaluated the patient, evaluated laboratory and imaging results, formulated the assessment and plan and placed orders. The Patient requires high complexity decision making for assessment and support.  Case was discussed on Rounds with the Respiratory Therapy Staff  [facility]  Richardson Dopp, MD [facility] Pulmonary Critical Care Medicine Sleep Medicine

## 2018-12-19 ENCOUNTER — Other Ambulatory Visit (HOSPITAL_COMMUNITY): Payer: Medicare HMO

## 2018-12-19 DIAGNOSIS — J849 Interstitial pulmonary disease, unspecified: Secondary | ICD-10-CM | POA: Diagnosis not present

## 2018-12-19 DIAGNOSIS — I82412 Acute embolism and thrombosis of left femoral vein: Secondary | ICD-10-CM | POA: Diagnosis not present

## 2018-12-19 DIAGNOSIS — J9621 Acute and chronic respiratory failure with hypoxia: Secondary | ICD-10-CM | POA: Diagnosis not present

## 2018-12-19 DIAGNOSIS — J80 Acute respiratory distress syndrome: Secondary | ICD-10-CM | POA: Diagnosis not present

## 2018-12-19 LAB — CBC
HCT: 33.4 % — ABNORMAL LOW (ref 36.0–46.0)
Hemoglobin: 10 g/dL — ABNORMAL LOW (ref 12.0–15.0)
MCH: 24.4 pg — ABNORMAL LOW (ref 26.0–34.0)
MCHC: 29.9 g/dL — ABNORMAL LOW (ref 30.0–36.0)
MCV: 81.7 fL (ref 80.0–100.0)
Platelets: 377 10*3/uL (ref 150–400)
RBC: 4.09 MIL/uL (ref 3.87–5.11)
RDW: 16.8 % — ABNORMAL HIGH (ref 11.5–15.5)
WBC: 10 10*3/uL (ref 4.0–10.5)
nRBC: 0 % (ref 0.0–0.2)

## 2018-12-19 LAB — BASIC METABOLIC PANEL
Anion gap: 11 (ref 5–15)
BUN: 15 mg/dL (ref 8–23)
CO2: 31 mmol/L (ref 22–32)
Calcium: 9.6 mg/dL (ref 8.9–10.3)
Chloride: 99 mmol/L (ref 98–111)
Creatinine, Ser: 0.41 mg/dL — ABNORMAL LOW (ref 0.44–1.00)
GFR calc Af Amer: >60 mL/min (ref >60)
GFR calc non Af Amer: >60 mL/min (ref >60)
Glucose, Bld: 140 mg/dL — ABNORMAL HIGH (ref 70–99)
Potassium: 3.8 mmol/L (ref 3.5–5.1)
Sodium: 141 mmol/L (ref 135–145)

## 2018-12-19 LAB — MAGNESIUM: Magnesium: 1.8 mg/dL (ref 1.7–2.4)

## 2018-12-19 NOTE — Progress Notes (Addendum)
Pulmonary Critical Care Medicine [place]   PULMONARY CRITICAL CARE SERVICE  PROGRESS NOTE  Date of Service: 12/19/2018  [name] Fitzpatrick  [id number]  DOB: November 10, 1955   DOA: 12/10/2018  Referring Physician: Merton Border, MD  HPI: [name] Fitzpatrick is a 63 y.o. female seen for follow up of Acute on Chronic Respiratory Failure.  Patient is here for aerosol trach collar trial 2 hours today on 30% FiO2 which she is just completed.  Now she will rest on pressure support proximal 11-hour follow-up with back on full support ventilator.  Medications: Reviewed on Rounds  Physical Exam:  Vitals: Pulse 105 respirations 28 BP 136/91 O2 sat 97%  Ventilator Settings currently on pressure support 12/5 FiO2 30%  . General: Comfortable at this time . Eyes: Grossly normal lids, irises & conjunctiva . ENT: grossly tongue is normal . Neck: no obvious mass . Cardiovascular: S1 S2 normal no gallop . Respiratory: No rales or rhonchi noted . Abdomen: soft . Skin: no rash seen on limited exam . Musculoskeletal: not rigid . Psychiatric:unable to assess . Neurologic: no seizure no involuntary movements         Lab Data:   Basic Metabolic Panel: Recent Labs  Lab 12/15/18 1002 12/16/18 0510 12/17/18 0517 12/19/18 0516  NA 140  --  142 141  K 3.9  --  4.0 3.8  CL 97*  --  100 99  CO2 30  --  31 31  GLUCOSE 106*  --  130* 140*  BUN 9  --  8 15  CREATININE 0.36*  --  0.36* 0.41*  CALCIUM 9.5  --  9.6 9.6  MG 1.7 1.8 2.0 1.8  PHOS  --   --  4.4  --     ABG: No results for input(s): PHART, PCO2ART, PO2ART, HCO3, O2SAT in the last 168 hours.  Liver Function Tests: No results for input(s): AST, ALT, ALKPHOS, BILITOT, PROT, ALBUMIN in the last 168 hours. No results for input(s): LIPASE, AMYLASE in the last 168 hours. No results for input(s): AMMONIA in the last 168 hours.  CBC: Recent Labs  Lab 12/15/18 1002 12/17/18 0517 12/19/18 0516  WBC 12.7* 12.3*  10.0  HGB 10.0* 10.6* 10.0*  HCT 33.1* 36.1 33.4*  MCV 82.5 81.7 81.7  PLT 389 431* 377    Cardiac Enzymes: No results for input(s): CKTOTAL, CKMB, CKMBINDEX, TROPONINI in the last 168 hours.  BNP (last 3 results) No results for input(s): BNP in the last 8760 hours.  ProBNP (last 3 results) No results for input(s): PROBNP in the last 8760 hours.  Radiological Exams: Dg Abd Portable 1v  Result Date: 12/19/2018 CLINICAL DATA:  Constipation. EXAM: PORTABLE ABDOMEN - 1 VIEW COMPARISON:  12/10/2018. FINDINGS: Normal bowel gas pattern. Increased density noted in stool from residual contrast. No significant increase in the colonic stool burden. Changes from previous lumbar spine posterior fusion and T12 kyphoplasty. Stable vena cava filter. IMPRESSION: 1. No acute findings.  No bowel obstruction. 2. No significant increase in colonic stool. Residual contrast noted in the colon. Electronically Signed   By: Lajean Manes M.D.   On: 12/19/2018 12:53    Assessment/Plan Active Problems:   Acute on chronic respiratory failure with hypoxia (HCC)   Seizure disorder (HCC)   Acute deep vein thrombosis (DVT) of left femoral vein (HCC)   Interstitial pneumonia (HCC)   ARDS (adult respiratory distress syndrome) ([place])   1. Acute on chronic respiratory failure with hypoxia  patient will  continue weaning per protocol.  Continue aggressive pulmonary toilet supportive measures.  Currently doing well just completed 2 hours of aerosol trach collar. 2. Seizure disorder no active seizure noted we will continue with supportive care. 3. Acute DVT treated we will continue to follow along 4. Interstitial pneumonia we will continue with supportive care. 5. ARDS slowly improving patient has been tolerating weaning better   I have personally seen and evaluated the patient, evaluated laboratory and imaging results, formulated the assessment and plan and placed orders. The Patient requires high complexity decision  making for assessment and support.  Case was discussed on Rounds with the Respiratory Therapy Staff  Allyne Gee, MD [facility] Pulmonary Critical Care Medicine Sleep Medicine

## 2018-12-20 ENCOUNTER — Other Ambulatory Visit (HOSPITAL_COMMUNITY): Payer: Medicare HMO

## 2018-12-20 DIAGNOSIS — J849 Interstitial pulmonary disease, unspecified: Secondary | ICD-10-CM | POA: Diagnosis not present

## 2018-12-20 DIAGNOSIS — I82412 Acute embolism and thrombosis of left femoral vein: Secondary | ICD-10-CM | POA: Diagnosis not present

## 2018-12-20 DIAGNOSIS — J80 Acute respiratory distress syndrome: Secondary | ICD-10-CM | POA: Diagnosis not present

## 2018-12-20 DIAGNOSIS — J9621 Acute and chronic respiratory failure with hypoxia: Secondary | ICD-10-CM | POA: Diagnosis not present

## 2018-12-20 NOTE — Progress Notes (Addendum)
Pulmonary Critical Care Medicine [place]   PULMONARY CRITICAL CARE SERVICE  PROGRESS NOTE  Date of Service: 12/20/2018  [name] Fitzpatrick  [id number]  DOB: September 27, 1955   DOA: 12/10/2018  Referring Physician: Merton Border, MD  HPI: [name] Fitzpatrick is a 63 y.o. female seen for follow up of Acute on Chronic Respiratory Failure.  Patient was able tolerate aerosol trach collar for about 30 minutes today before becoming hypoxic with an O2 sat down to 83%.  Now back on pressure support 12/5 FiO2 35%.  Medications: Reviewed on Rounds  Physical Exam:  Vitals: Pulse 99 respiration 26 BP 115/63 O2 sat 96% temp 97.8  Ventilator Settings pressure support 12/5 FiO2 35%  . General: Comfortable at this time . Eyes: Grossly normal lids, irises & conjunctiva . ENT: grossly tongue is normal . Neck: no obvious mass . Cardiovascular: S1 S2 normal no gallop . Respiratory: No rales or rhonchi noted . Abdomen: soft . Skin: no rash seen on limited exam . Musculoskeletal: not rigid . Psychiatric:unable to assess . Neurologic: no seizure no involuntary movements         Lab Data:   Basic Metabolic Panel: Recent Labs  Lab 12/15/18 1002 12/16/18 0510 12/17/18 0517 12/19/18 0516  NA 140  --  142 141  K 3.9  --  4.0 3.8  CL 97*  --  100 99  CO2 30  --  31 31  GLUCOSE 106*  --  130* 140*  BUN 9  --  8 15  CREATININE 0.36*  --  0.36* 0.41*  CALCIUM 9.5  --  9.6 9.6  MG 1.7 1.8 2.0 1.8  PHOS  --   --  4.4  --     ABG: No results for input(s): PHART, PCO2ART, PO2ART, HCO3, O2SAT in the last 168 hours.  Liver Function Tests: No results for input(s): AST, ALT, ALKPHOS, BILITOT, PROT, ALBUMIN in the last 168 hours. No results for input(s): LIPASE, AMYLASE in the last 168 hours. No results for input(s): AMMONIA in the last 168 hours.  CBC: Recent Labs  Lab 12/15/18 1002 12/17/18 0517 12/19/18 0516  WBC 12.7* 12.3* 10.0  HGB 10.0* 10.6* 10.0*  HCT 33.1*  36.1 33.4*  MCV 82.5 81.7 81.7  PLT 389 431* 377    Cardiac Enzymes: No results for input(s): CKTOTAL, CKMB, CKMBINDEX, TROPONINI in the last 168 hours.  BNP (last 3 results) No results for input(s): BNP in the last 8760 hours.  ProBNP (last 3 results) No results for input(s): PROBNP in the last 8760 hours.  Radiological Exams: Dg Chest Port 1 View  Result Date: 12/20/2018 CLINICAL DATA:  CHF.  Respiratory failure. EXAM: PORTABLE CHEST 1 VIEW COMPARISON:  12/11/2018 FINDINGS: There is a tube overlying the central neck and chest of uncertain clinical significance likely external to the patient. Lungs are somewhat hypoinflated demonstrate mild hazy bilateral perihilar opacification likely mild interstitial edema without significant change. No definite effusion. Cardiomediastinal silhouette and remainder of the exam is unchanged. IMPRESSION: Hypoinflation with suggestion of mild vascular congestion without significant change. Electronically Signed   By: Marin Olp M.D.   On: 12/20/2018 12:58   Dg Abd Portable 1v  Result Date: 12/19/2018 CLINICAL DATA:  Constipation. EXAM: PORTABLE ABDOMEN - 1 VIEW COMPARISON:  12/10/2018. FINDINGS: Normal bowel gas pattern. Increased density noted in stool from residual contrast. No significant increase in the colonic stool burden. Changes from previous lumbar spine posterior fusion and T12 kyphoplasty. Stable vena cava filter.  IMPRESSION: 1. No acute findings.  No bowel obstruction. 2. No significant increase in colonic stool. Residual contrast noted in the colon. Electronically Signed   By: Lajean Manes M.D.   On: 12/19/2018 12:53    Assessment/Plan Active Problems:   Acute on chronic respiratory failure with hypoxia (HCC)   Seizure disorder (HCC)   Acute deep vein thrombosis (DVT) of left femoral vein (HCC)   Interstitial pneumonia (HCC)   ARDS (adult respiratory distress syndrome) ([place])   1. Acute on chronic respiratory failure with hypoxia  patient will continue weaning per protocol.  Continue aggressive pulmonary toilet supportive measures.  Currently doing well just completed 2 hours of aerosol trach collar. 2. Seizure disorder no active seizure noted we will continue with supportive care. 3. Acute DVT treated we will continue to follow along 4. Interstitial pneumonia we will continue with supportive care. 5. ARDS slowly improving patient has been tolerating weaning better   I have personally seen and evaluated the patient, evaluated laboratory and imaging results, formulated the assessment and plan and placed orders. The Patient requires high complexity decision making for assessment and support.  Case was discussed on Rounds with the Respiratory Therapy Staff  Allyne Gee, MD [facility] Pulmonary Critical Care Medicine Sleep Medicine

## 2018-12-21 ENCOUNTER — Other Ambulatory Visit (HOSPITAL_COMMUNITY): Payer: Medicare HMO

## 2018-12-21 DIAGNOSIS — J849 Interstitial pulmonary disease, unspecified: Secondary | ICD-10-CM | POA: Diagnosis not present

## 2018-12-21 DIAGNOSIS — J80 Acute respiratory distress syndrome: Secondary | ICD-10-CM | POA: Diagnosis not present

## 2018-12-21 DIAGNOSIS — J9621 Acute and chronic respiratory failure with hypoxia: Secondary | ICD-10-CM | POA: Diagnosis not present

## 2018-12-21 DIAGNOSIS — I82412 Acute embolism and thrombosis of left femoral vein: Secondary | ICD-10-CM | POA: Diagnosis not present

## 2018-12-21 NOTE — Progress Notes (Addendum)
Pulmonary Critical Care Medicine [place]   PULMONARY CRITICAL CARE SERVICE  PROGRESS NOTE  Date of Service: 12/21/2018  [name] Fitzpatrick  [id number]  DOB: 1955-06-17   DOA: 12/10/2018  Referring Physician: Merton Border, MD  HPI: [name] Fitzpatrick is a 63 y.o. female seen for follow up of Acute on Chronic Respiratory Failure.  Patient remains weaning on aerosol trach collar 35% 5 O2 for goal of 8 hours today currently doing well.  Using PMV no difficulty.  Medications: Reviewed on Rounds  Physical Exam:  Vitals: Pulse 101 respiration 27 BP 108/64 O2 sat on percent temp 98.8  Ventilator Settings ATC 35%  . General: Comfortable at this time . Eyes: Grossly normal lids, irises & conjunctiva . ENT: grossly tongue is normal . Neck: no obvious mass . Cardiovascular: S1 S2 normal no gallop . Respiratory: No rales or rhonchi noted . Abdomen: soft . Skin: no rash seen on limited exam . Musculoskeletal: not rigid . Psychiatric:unable to assess . Neurologic: no seizure no involuntary movements         Lab Data:   Basic Metabolic Panel: Recent Labs  Lab 12/15/18 1002 12/16/18 0510 12/17/18 0517 12/19/18 0516  NA 140  --  142 141  K 3.9  --  4.0 3.8  CL 97*  --  100 99  CO2 30  --  31 31  GLUCOSE 106*  --  130* 140*  BUN 9  --  8 15  CREATININE 0.36*  --  0.36* 0.41*  CALCIUM 9.5  --  9.6 9.6  MG 1.7 1.8 2.0 1.8  PHOS  --   --  4.4  --     ABG: No results for input(s): PHART, PCO2ART, PO2ART, HCO3, O2SAT in the last 168 hours.  Liver Function Tests: No results for input(s): AST, ALT, ALKPHOS, BILITOT, PROT, ALBUMIN in the last 168 hours. No results for input(s): LIPASE, AMYLASE in the last 168 hours. No results for input(s): AMMONIA in the last 168 hours.  CBC: Recent Labs  Lab 12/15/18 1002 12/17/18 0517 12/19/18 0516  WBC 12.7* 12.3* 10.0  HGB 10.0* 10.6* 10.0*  HCT 33.1* 36.1 33.4*  MCV 82.5 81.7 81.7  PLT 389 431* 377     Cardiac Enzymes: No results for input(s): CKTOTAL, CKMB, CKMBINDEX, TROPONINI in the last 168 hours.  BNP (last 3 results) No results for input(s): BNP in the last 8760 hours.  ProBNP (last 3 results) No results for input(s): PROBNP in the last 8760 hours.  Radiological Exams: Dg Chest Port 1 View  Result Date: 12/20/2018 CLINICAL DATA:  CHF.  Respiratory failure. EXAM: PORTABLE CHEST 1 VIEW COMPARISON:  12/11/2018 FINDINGS: There is a tube overlying the central neck and chest of uncertain clinical significance likely external to the patient. Lungs are somewhat hypoinflated demonstrate mild hazy bilateral perihilar opacification likely mild interstitial edema without significant change. No definite effusion. Cardiomediastinal silhouette and remainder of the exam is unchanged. IMPRESSION: Hypoinflation with suggestion of mild vascular congestion without significant change. Electronically Signed   By: Marin Olp M.D.   On: 12/20/2018 12:58    Assessment/Plan Active Problems:   Acute on chronic respiratory failure with hypoxia (HCC)   Seizure disorder (HCC)   Acute deep vein thrombosis (DVT) of left femoral vein (HCC)   Interstitial pneumonia (HCC)   ARDS (adult respiratory distress syndrome) ([place])   1. Acute on chronic respiratory failure with hypoxia  continuing at this time currently on aerosol trach collar for  goal of 8 hours.  Continue supportive measures and pulmonary toilet.   2. Seizure diIsorder no active seizure noted we will continue with supportive care. 3. Acute DVT treated we will continue to follow along 4. Interstitial pneumonia we will continue with supportive care. 5. ARDS slowly improving patient has been tolerating weaning better   I have personally seen and evaluated the patient, evaluated laboratory and imaging results, formulated the assessment and plan and placed orders. The Patient requires high complexity decision making for assessment and support.   Case was discussed on Rounds with the Respiratory Therapy Staff  Allyne Gee, MD [facility] Pulmonary Critical Care Medicine Sleep Medicine

## 2018-12-22 DIAGNOSIS — J9621 Acute and chronic respiratory failure with hypoxia: Secondary | ICD-10-CM | POA: Diagnosis not present

## 2018-12-22 DIAGNOSIS — I82412 Acute embolism and thrombosis of left femoral vein: Secondary | ICD-10-CM | POA: Diagnosis not present

## 2018-12-22 DIAGNOSIS — J80 Acute respiratory distress syndrome: Secondary | ICD-10-CM | POA: Diagnosis not present

## 2018-12-22 DIAGNOSIS — J849 Interstitial pulmonary disease, unspecified: Secondary | ICD-10-CM | POA: Diagnosis not present

## 2018-12-22 LAB — CBC
HCT: 34.8 % — ABNORMAL LOW (ref 36.0–46.0)
Hemoglobin: 10.4 g/dL — ABNORMAL LOW (ref 12.0–15.0)
MCH: 24 pg — ABNORMAL LOW (ref 26.0–34.0)
MCHC: 29.9 g/dL — ABNORMAL LOW (ref 30.0–36.0)
MCV: 80.4 fL (ref 80.0–100.0)
Platelets: 278 10*3/uL (ref 150–400)
RBC: 4.33 MIL/uL (ref 3.87–5.11)
RDW: 16.9 % — ABNORMAL HIGH (ref 11.5–15.5)
WBC: 7.9 10*3/uL (ref 4.0–10.5)
nRBC: 0 % (ref 0.0–0.2)

## 2018-12-22 LAB — RENAL FUNCTION PANEL
Albumin: 3.1 g/dL — ABNORMAL LOW (ref 3.5–5.0)
Anion gap: 13 (ref 5–15)
BUN: 13 mg/dL (ref 8–23)
CO2: 31 mmol/L (ref 22–32)
Calcium: 9.6 mg/dL (ref 8.9–10.3)
Chloride: 95 mmol/L — ABNORMAL LOW (ref 98–111)
Creatinine, Ser: 0.38 mg/dL — ABNORMAL LOW (ref 0.44–1.00)
GFR calc Af Amer: >60 mL/min (ref >60)
GFR calc non Af Amer: >60 mL/min (ref >60)
Glucose, Bld: 101 mg/dL — ABNORMAL HIGH (ref 70–99)
Phosphorus: 5.3 mg/dL — ABNORMAL HIGH (ref 2.5–4.6)
Potassium: 3.7 mmol/L (ref 3.5–5.1)
Sodium: 139 mmol/L (ref 135–145)

## 2018-12-22 LAB — MAGNESIUM: Magnesium: 1.9 mg/dL (ref 1.7–2.4)

## 2018-12-22 NOTE — Progress Notes (Addendum)
Pulmonary Critical Care Medicine [place]   PULMONARY CRITICAL CARE SERVICE  PROGRESS NOTE  Date of Service: 12/22/2018  [name] Fitzpatrick  [id number]  DOB: 05/23/1955   DOA: 12/10/2018  Referring Physician: Merton Border, MD  HPI: [name] Fitzpatrick is a 63 y.o. female seen for follow up of Acute on Chronic Respiratory Failure.  Patient is a 12-hour goal today on aerosol trach collar 40% FiO2 satting well at this time.  Patient does have increased amount of anxiety as well as secretions.  Medications: Reviewed on Rounds  Physical Exam:  Vitals: Pulse 98 respirations 19 BP 112/69 O2 sat 96% temp 98.1  Ventilator Settings ATC 40%  . General: Comfortable at this time . Eyes: Grossly normal lids, irises & conjunctiva . ENT: grossly tongue is normal . Neck: no obvious mass . Cardiovascular: S1 S2 normal no gallop . Respiratory: No rales rhonchi noted . Abdomen: soft . Skin: no rash seen on limited exam . Musculoskeletal: not rigid . Psychiatric:unable to assess . Neurologic: no seizure no involuntary movements         Lab Data:   Basic Metabolic Panel: Recent Labs  Lab 12/16/18 0510 12/17/18 0517 12/19/18 0516 12/22/18 0627  NA  --  142 141 139  K  --  4.0 3.8 3.7  CL  --  100 99 95*  CO2  --  31 31 31   GLUCOSE  --  130* 140* 101*  BUN  --  8 15 13   CREATININE  --  0.36* 0.41* 0.38*  CALCIUM  --  9.6 9.6 9.6  MG 1.8 2.0 1.8 1.9  PHOS  --  4.4  --  5.3*    ABG: No results for input(s): PHART, PCO2ART, PO2ART, HCO3, O2SAT in the last 168 hours.  Liver Function Tests: Recent Labs  Lab 12/22/18 0627  ALBUMIN 3.1*   No results for input(s): LIPASE, AMYLASE in the last 168 hours. No results for input(s): AMMONIA in the last 168 hours.  CBC: Recent Labs  Lab 12/17/18 0517 12/19/18 0516 12/22/18 0627  WBC 12.3* 10.0 7.9  HGB 10.6* 10.0* 10.4*  HCT 36.1 33.4* 34.8*  MCV 81.7 81.7 80.4  PLT 431* 377 278    Cardiac  Enzymes: No results for input(s): CKTOTAL, CKMB, CKMBINDEX, TROPONINI in the last 168 hours.  BNP (last 3 results) No results for input(s): BNP in the last 8760 hours.  ProBNP (last 3 results) No results for input(s): PROBNP in the last 8760 hours.  Radiological Exams: Dg Abd 1 View  Result Date: 12/21/2018 CLINICAL DATA:  Constipation EXAM: ABDOMEN - 1 VIEW COMPARISON:  12/19/2018 abdominal radiograph FINDINGS: IVC filter in the medial right abdomen, unchanged. Posterior spinal fusion hardware bilaterally in the lower lumbar spine. Vertebroplasty material at the T12 vertebral compression fracture site. No disproportionately dilated small bowel loops. Small amount retained oral contrast throughout colon. Mild-to-moderate colonic stool volume. No evidence of pneumatosis or pneumoperitoneum. Patchy reticular opacities at both lung bases are unchanged. IMPRESSION: Nonobstructive bowel gas pattern. Retained oral contrast in the large bowel. Mild-to-moderate colonic stool volume. Electronically Signed   By: Ilona Sorrel M.D.   On: 12/21/2018 17:34    Assessment/Plan Active Problems:   Acute on chronic respiratory failure with hypoxia (HCC)   Seizure disorder (HCC)   Acute deep vein thrombosis (DVT) of left femoral vein (HCC)   Interstitial pneumonia (HCC)   ARDS (adult respiratory distress syndrome) ([place])   1. Acute on chronic respiratory failure with hypoxia  continuing at this time currently on aerosol trach collar for goal of 12 hours.  Continue supportive measures and pulmonary toilet.   2. Seizure diIsorder no active seizure noted we will continue with supportive care. 3. Acute DVT treated we will continue to follow along 4. Interstitial pneumonia we will continue with supportive care. 5. ARDS slowly improving patient has been tolerating weaning better   I have personally seen and evaluated the patient, evaluated laboratory and imaging results, formulated the assessment and plan and  placed orders. The Patient requires high complexity decision making for assessment and support.  Case was discussed on Rounds with the Respiratory Therapy Staff  Allyne Gee, MD [facility] Pulmonary Critical Care Medicine Sleep Medicine

## 2018-12-23 DIAGNOSIS — J9621 Acute and chronic respiratory failure with hypoxia: Secondary | ICD-10-CM | POA: Diagnosis not present

## 2018-12-23 DIAGNOSIS — J849 Interstitial pulmonary disease, unspecified: Secondary | ICD-10-CM | POA: Diagnosis not present

## 2018-12-23 DIAGNOSIS — I82412 Acute embolism and thrombosis of left femoral vein: Secondary | ICD-10-CM | POA: Diagnosis not present

## 2018-12-23 DIAGNOSIS — J80 Acute respiratory distress syndrome: Secondary | ICD-10-CM | POA: Diagnosis not present

## 2018-12-23 NOTE — Progress Notes (Addendum)
Pulmonary Critical Care Medicine [place]   PULMONARY CRITICAL CARE SERVICE  PROGRESS NOTE  Date of Service: 12/23/2018  MAYOLA OTTOSON  [id number]  DOB: [birth year]   DOA: 12/10/2018  Referring Physician: Merton Border, MD  HPI: [name] Fitzpatrick is a 63 y.o. female seen for follow up of Acute on Chronic Respiratory Failure.  Patient has a 12-hour goal today on aerosol trach collar 28% FiO2 currently satting well with no distress.  Medications: Reviewed on Rounds  Physical Exam:  Vitals: Pulmonary 6 respirations 23 BP 108/68 O2 sat 94% 98.2  Ventilator Settings ATC 28%  . General: Comfortable at this time . Eyes: Grossly normal lids, irises & conjunctiva . ENT: grossly tongue is normal . Neck: no obvious mass . Cardiovascular: S1 S2 normal no gallop . Respiratory: Nose or rhonchi noted . Abdomen: soft . Skin: no rash seen on limited exam . Musculoskeletal: not rigid . Psychiatric:unable to assess . Neurologic: no seizure no involuntary movements         Lab Data:   Basic Metabolic Panel: Recent Labs  Lab 12/17/18 0517 12/19/18 0516 12/22/18 0627  NA 142 141 139  K 4.0 3.8 3.7  CL 100 99 95*  CO2 31 31 31   GLUCOSE 130* 140* 101*  BUN 8 15 13   CREATININE 0.36* 0.41* 0.38*  CALCIUM 9.6 9.6 9.6  MG 2.0 1.8 1.9  PHOS 4.4  --  5.3*    ABG: No results for input(s): PHART, PCO2ART, PO2ART, HCO3, O2SAT in the last 168 hours.  Liver Function Tests: Recent Labs  Lab 12/22/18 0627  ALBUMIN 3.1*   No results for input(s): LIPASE, AMYLASE in the last 168 hours. No results for input(s): AMMONIA in the last 168 hours.  CBC: Recent Labs  Lab 12/17/18 0517 12/19/18 0516 12/22/18 0627  WBC 12.3* 10.0 7.9  HGB 10.6* 10.0* 10.4*  HCT 36.1 33.4* 34.8*  MCV 81.7 81.7 80.4  PLT 431* 377 278    Cardiac Enzymes: No results for input(s): CKTOTAL, CKMB, CKMBINDEX, TROPONINI in the last 168 hours.  BNP (last 3 results) No results  for input(s): BNP in the last 8760 hours.  ProBNP (last 3 results) No results for input(s): PROBNP in the last 8760 hours.  Radiological Exams: No results found.  Assessment/Plan Active Problems:   Acute on chronic respiratory failure with hypoxia (HCC)   Seizure disorder (HCC)   Acute deep vein thrombosis (DVT) of left femoral vein (HCC)   Interstitial pneumonia (HCC)   ARDS (adult respiratory distress syndrome) ([place])   1. Acute on chronic respiratory failure with hypoxia continuing at this time currently on aerosol trach collar for goal of 20 hours.  Continue supportive measures and pulmonary toilet.   2. Seizure diIsorder no active seizure noted we will continue with supportive care. 3. Acute DVT treated we will continue to follow along 4. Interstitial pneumonia we will continue with supportive care. 5. ARDS slowly improving patient has been tolerating weaning better   I have personally seen and evaluated the patient, evaluated laboratory and imaging results, formulated the assessment and plan and placed orders. The Patient requires high complexity decision making for assessment and support.  Case was discussed on Rounds with the Respiratory Therapy Staff  Allyne Gee, MD [facility] Pulmonary Critical Care Medicine Sleep Medicine

## 2018-12-24 DIAGNOSIS — J80 Acute respiratory distress syndrome: Secondary | ICD-10-CM | POA: Diagnosis not present

## 2018-12-24 DIAGNOSIS — J9621 Acute and chronic respiratory failure with hypoxia: Secondary | ICD-10-CM | POA: Diagnosis not present

## 2018-12-24 DIAGNOSIS — I82412 Acute embolism and thrombosis of left femoral vein: Secondary | ICD-10-CM | POA: Diagnosis not present

## 2018-12-24 DIAGNOSIS — J849 Interstitial pulmonary disease, unspecified: Secondary | ICD-10-CM | POA: Diagnosis not present

## 2018-12-24 NOTE — Progress Notes (Addendum)
Pulmonary Critical Care Medicine [place]   PULMONARY CRITICAL CARE SERVICE  PROGRESS NOTE  Date of Service: 12/24/2018  [name] Fitzpatrick  [id number]  DOB: 18-May-1955   DOA: 12/10/2018  Referring Physician: Merton Border, MD  HPI: [name] Fitzpatrick is a 63 y.o. female seen for follow up of Acute on Chronic Respiratory Failure.  Patient remains on aerosol trach collar 24-hour goal 40% FiO2.  Medications: Reviewed on Rounds  Physical Exam:  Vitals: Pulse 91 respirations 20 BP 132/70 O2 sat 93% temp 98 point  Ventilator Settings ATC 40%  . General: Comfortable at this time . Eyes: Grossly normal lids, irises & conjunctiva . ENT: grossly tongue is normal . Neck: no obvious mass . Cardiovascular: S1 S2 normal no gallop . Respiratory: No rales or rhonchi noted . Abdomen: soft . Skin: no rash seen on limited exam . Musculoskeletal: not rigid . Psychiatric:unable to assess . Neurologic: no seizure no involuntary movements         Lab Data:   Basic Metabolic Panel: Recent Labs  Lab 12/19/18 0516 12/22/18 0627  NA 141 139  K 3.8 3.7  CL 99 95*  CO2 31 31  GLUCOSE 140* 101*  BUN 15 13  CREATININE 0.41* 0.38*  CALCIUM 9.6 9.6  MG 1.8 1.9  PHOS  --  5.3*    ABG: No results for input(s): PHART, PCO2ART, PO2ART, HCO3, O2SAT in the last 168 hours.  Liver Function Tests: Recent Labs  Lab 12/22/18 0627  ALBUMIN 3.1*   No results for input(s): LIPASE, AMYLASE in the last 168 hours. No results for input(s): AMMONIA in the last 168 hours.  CBC: Recent Labs  Lab 12/19/18 0516 12/22/18 0627  WBC 10.0 7.9  HGB 10.0* 10.4*  HCT 33.4* 34.8*  MCV 81.7 80.4  PLT 377 278    Cardiac Enzymes: No results for input(s): CKTOTAL, CKMB, CKMBINDEX, TROPONINI in the last 168 hours.  BNP (last 3 results) No results for input(s): BNP in the last 8760 hours.  ProBNP (last 3 results) No results for input(s): PROBNP in the last 8760  hours.  Radiological Exams: No results found.  Assessment/Plan Active Problems:   Acute on chronic respiratory failure with hypoxia (HCC)   Seizure disorder (HCC)   Acute deep vein thrombosis (DVT) of left femoral vein (HCC)   Interstitial pneumonia (HCC)   ARDS (adult respiratory distress syndrome) ([place])   1. Acute on chronic respiratory failure with hypoxiacontinuing at this time currently on aerosol trach collar for goal of 24 hours. Continue supportive measures and pulmonary toilet.  2. Seizure diIsorder no active seizure noted we will continue with supportive care. 3. Acute DVT treated we will continue to follow along 4. Interstitial pneumonia we will continue with supportive care. 5. ARDS slowly improving patient has been tolerating weaning better   I have personally seen and evaluated the patient, evaluated laboratory and imaging results, formulated the assessment and plan and placed orders. The Patient requires high complexity decision making for assessment and support.  Case was discussed on Rounds with the Respiratory Therapy Staff  Allyne Gee, MD [facility] Pulmonary Critical Care Medicine Sleep Medicine

## 2018-12-25 ENCOUNTER — Other Ambulatory Visit (HOSPITAL_COMMUNITY): Payer: Medicare HMO

## 2018-12-25 DIAGNOSIS — J9621 Acute and chronic respiratory failure with hypoxia: Secondary | ICD-10-CM | POA: Diagnosis not present

## 2018-12-25 DIAGNOSIS — J80 Acute respiratory distress syndrome: Secondary | ICD-10-CM | POA: Diagnosis not present

## 2018-12-25 DIAGNOSIS — I82412 Acute embolism and thrombosis of left femoral vein: Secondary | ICD-10-CM | POA: Diagnosis not present

## 2018-12-25 DIAGNOSIS — J849 Interstitial pulmonary disease, unspecified: Secondary | ICD-10-CM | POA: Diagnosis not present

## 2018-12-25 LAB — BASIC METABOLIC PANEL
Anion gap: 11 (ref 5–15)
BUN: 11 mg/dL (ref 8–23)
CO2: 31 mmol/L (ref 22–32)
Calcium: 9.5 mg/dL (ref 8.9–10.3)
Chloride: 96 mmol/L — ABNORMAL LOW (ref 98–111)
Creatinine, Ser: 0.41 mg/dL — ABNORMAL LOW (ref 0.44–1.00)
GFR calc Af Amer: >60 mL/min (ref >60)
GFR calc non Af Amer: >60 mL/min (ref >60)
Glucose, Bld: 124 mg/dL — ABNORMAL HIGH (ref 70–99)
Potassium: 3.7 mmol/L (ref 3.5–5.1)
Sodium: 138 mmol/L (ref 135–145)

## 2018-12-25 LAB — CBC
HCT: 33 % — ABNORMAL LOW (ref 36.0–46.0)
Hemoglobin: 9.7 g/dL — ABNORMAL LOW (ref 12.0–15.0)
MCH: 24 pg — ABNORMAL LOW (ref 26.0–34.0)
MCHC: 29.4 g/dL — ABNORMAL LOW (ref 30.0–36.0)
MCV: 81.5 fL (ref 80.0–100.0)
Platelets: 260 10*3/uL (ref 150–400)
RBC: 4.05 MIL/uL (ref 3.87–5.11)
RDW: 17.2 % — ABNORMAL HIGH (ref 11.5–15.5)
WBC: 10.8 10*3/uL — ABNORMAL HIGH (ref 4.0–10.5)
nRBC: 0 % (ref 0.0–0.2)

## 2018-12-25 LAB — BLOOD GAS, ARTERIAL
Acid-Base Excess: 9.2 mmol/L — ABNORMAL HIGH (ref 0.0–2.0)
Bicarbonate: 34 mmol/L — ABNORMAL HIGH (ref 20.0–28.0)
FIO2: 40
O2 Saturation: 98.1 %
Patient temperature: 98.6
pCO2 arterial: 54.7 mmHg — ABNORMAL HIGH (ref 32.0–48.0)
pH, Arterial: 7.41 (ref 7.350–7.450)
pO2, Arterial: 110 mmHg — ABNORMAL HIGH (ref 83.0–108.0)

## 2018-12-25 LAB — MAGNESIUM: Magnesium: 1.7 mg/dL (ref 1.7–2.4)

## 2018-12-25 NOTE — Progress Notes (Addendum)
Pulmonary Critical Care Medicine [place]   PULMONARY CRITICAL CARE SERVICE  PROGRESS NOTE  Date of Service: 12/25/2018  [name] Fitzpatrick  [id number]  DOB: 04-21-1955   DOA: 12/10/2018  Referring Physician: Merton Border, MD  HPI: [name] Fitzpatrick is a 63 y.o. female seen for follow up of Acute on Chronic Respiratory Failure.  Patient remains on 40% aerosol trach collar for 48-hour goal at this time.  Currently satting well distress.  Medications: Reviewed on Rounds  Physical Exam:  Vitals: Pulse 98 respirations 20 BP 124/68 O2 sat 98% temp 98.6  Ventilator Settings ATC 40%  . General: Comfortable at this time . Eyes: Grossly normal lids, irises & conjunctiva . ENT: grossly tongue is normal . Neck: no obvious mass . Cardiovascular: S1 S2 normal no gallop . Respiratory: No rales or rhonchi noted . Abdomen: soft . Skin: no rash seen on limited exam . Musculoskeletal: not rigid . Psychiatric:unable to assess . Neurologic: no seizure no involuntary movements         Lab Data:   Basic Metabolic Panel: Recent Labs  Lab 12/19/18 0516 12/22/18 0627 12/25/18 1248  NA 141 139 138  K 3.8 3.7 3.7  CL 99 95* 96*  CO2 31 31 31   GLUCOSE 140* 101* 124*  BUN 15 13 11   CREATININE 0.41* 0.38* 0.41*  CALCIUM 9.6 9.6 9.5  MG 1.8 1.9 1.7  PHOS  --  5.3*  --     ABG: Recent Labs  Lab 12/25/18 1230  PHART 7.410  PCO2ART 54.7*  PO2ART 110*  HCO3 34.0*  O2SAT 98.1    Liver Function Tests: Recent Labs  Lab 12/22/18 0627  ALBUMIN 3.1*   No results for input(s): LIPASE, AMYLASE in the last 168 hours. No results for input(s): AMMONIA in the last 168 hours.  CBC: Recent Labs  Lab 12/19/18 0516 12/22/18 0627 12/25/18 1248  WBC 10.0 7.9 10.8*  HGB 10.0* 10.4* 9.7*  HCT 33.4* 34.8* 33.0*  MCV 81.7 80.4 81.5  PLT 377 278 260    Cardiac Enzymes: No results for input(s): CKTOTAL, CKMB, CKMBINDEX, TROPONINI in the last 168 hours.  BNP  (last 3 results) No results for input(s): BNP in the last 8760 hours.  ProBNP (last 3 results) No results for input(s): PROBNP in the last 8760 hours.  Radiological Exams: Dg Chest Port 1 View  Result Date: 12/25/2018 CLINICAL DATA:  Hypoxia and shortness of breath. EXAM: PORTABLE CHEST 1 VIEW COMPARISON:  12/20/2018 FINDINGS: The tracheostomy tube remains in satisfactory position. Enlarged cardiac silhouette with an interval increase in size. Diffuse prominence of the interstitial markings with mild improvement. Probable mild superimposed airspace opacity in the right upper lobe. Small right pleural effusion. Cholecystectomy clips. Thoracolumbar kyphoplasty material. IMPRESSION: 1. Mild improvement in changes of congestive heart failure superimposed on chronic interstitial lung disease. 2. Possible mild superimposed pneumonia in the right upper lobe. 3. Small right pleural effusion. 4. Cardiomegaly. Electronically Signed   By: Claudie Revering M.D.   On: 12/25/2018 12:54    Assessment/Plan Active Problems:   Acute on chronic respiratory failure with hypoxia (HCC)   Seizure disorder (HCC)   Acute deep vein thrombosis (DVT) of left femoral vein (HCC)   Interstitial pneumonia (HCC)   ARDS (adult respiratory distress syndrome) ([place])   1. Acute on chronic respiratory failure with hypoxiacontinuing at this time currently on aerosol trach collar for goal of 48 hours. Continue supportive measures and pulmonary toilet.  2. Seizure diIsorder no  active seizure noted we will continue with supportive care. 3. Acute DVT treated we will continue to follow along 4. Interstitial pneumonia we will continue with supportive care. 5. ARDS slowly improving patient has been tolerating weaning better   I have personally seen and evaluated the patient, evaluated laboratory and imaging results, formulated the assessment and plan and placed orders. The Patient requires high complexity decision making for  assessment and support.  Case was discussed on Rounds with the Respiratory Therapy Staff  Allyne Gee, MD [facility] Pulmonary Critical Care Medicine Sleep Medicine

## 2018-12-26 DIAGNOSIS — I82412 Acute embolism and thrombosis of left femoral vein: Secondary | ICD-10-CM | POA: Diagnosis not present

## 2018-12-26 DIAGNOSIS — J9621 Acute and chronic respiratory failure with hypoxia: Secondary | ICD-10-CM | POA: Diagnosis not present

## 2018-12-26 DIAGNOSIS — J849 Interstitial pulmonary disease, unspecified: Secondary | ICD-10-CM | POA: Diagnosis not present

## 2018-12-26 DIAGNOSIS — J80 Acute respiratory distress syndrome: Secondary | ICD-10-CM | POA: Diagnosis not present

## 2018-12-26 NOTE — Progress Notes (Addendum)
Pulmonary Critical Care Medicine [name]   PULMONARY CRITICAL CARE SERVICE  PROGRESS NOTE  Date of Service: 12/26/2018  [name] Fitzpatrick  [id number]  DOB: 1956-01-27   DOA: 12/10/2018  Referring Physician: Merton Border, MD  HPI: [name] Fitzpatrick is a 63 y.o. female seen for follow up of Acute on Chronic Respiratory Failure.  Patient is now been 48 hours on aerosol trach collar 40% FiO2 satting well at this time.  Medications: Reviewed on Rounds  Physical Exam:  Vitals: Pulse 97 respirations 25 BP 124% 3 O2 sat 97% temp 97.8  Ventilator Settings ATC 40%  . General: Comfortable at this time . Eyes: Grossly normal lids, irises & conjunctiva . ENT: grossly tongue is normal . Neck: no obvious mass . Cardiovascular: S1 S2 normal no gallop . Respiratory: No rales or rhonchi noted . Abdomen: soft . Skin: no rash seen on limited exam . Musculoskeletal: not rigid . Psychiatric:unable to assess . Neurologic: no seizure no involuntary movements         Lab Data:   Basic Metabolic Panel: Recent Labs  Lab 12/22/18 0627 12/25/18 1248  NA 139 138  K 3.7 3.7  CL 95* 96*  CO2 31 31  GLUCOSE 101* 124*  BUN 13 11  CREATININE 0.38* 0.41*  CALCIUM 9.6 9.5  MG 1.9 1.7  PHOS 5.3*  --     ABG: Recent Labs  Lab 12/25/18 1230  PHART 7.410  PCO2ART 54.7*  PO2ART 110*  HCO3 34.0*  O2SAT 98.1    Liver Function Tests: Recent Labs  Lab 12/22/18 0627  ALBUMIN 3.1*   No results for input(s): LIPASE, AMYLASE in the last 168 hours. No results for input(s): AMMONIA in the last 168 hours.  CBC: Recent Labs  Lab 12/22/18 0627 12/25/18 1248  WBC 7.9 10.8*  HGB 10.4* 9.7*  HCT 34.8* 33.0*  MCV 80.4 81.5  PLT 278 260    Cardiac Enzymes: No results for input(s): CKTOTAL, CKMB, CKMBINDEX, TROPONINI in the last 168 hours.  BNP (last 3 results) No results for input(s): BNP in the last 8760 hours.  ProBNP (last 3 results) No results for  input(s): PROBNP in the last 8760 hours.  Radiological Exams: Dg Chest Port 1 View  Result Date: 12/25/2018 CLINICAL DATA:  Hypoxia and shortness of breath. EXAM: PORTABLE CHEST 1 VIEW COMPARISON:  12/20/2018 FINDINGS: The tracheostomy tube remains in satisfactory position. Enlarged cardiac silhouette with an interval increase in size. Diffuse prominence of the interstitial markings with mild improvement. Probable mild superimposed airspace opacity in the right upper lobe. Small right pleural effusion. Cholecystectomy clips. Thoracolumbar kyphoplasty material. IMPRESSION: 1. Mild improvement in changes of congestive heart failure superimposed on chronic interstitial lung disease. 2. Possible mild superimposed pneumonia in the right upper lobe. 3. Small right pleural effusion. 4. Cardiomegaly. Electronically Signed   By: Claudie Revering M.D.   On: 12/25/2018 12:54    Assessment/Plan Active Problems:   Acute on chronic respiratory failure with hypoxia (HCC)   Seizure disorder (HCC)   Acute deep vein thrombosis (DVT) of left femoral vein (HCC)   Interstitial pneumonia (HCC)   ARDS (adult respiratory distress syndrome) ([place])   1. Acute on chronic respiratory failure with hypoxiapatient has managed 48 hours on aerosol trach collar.  We will continue supportive measures pulmonary toilet.  Continue to wean per protocol. 2. Seizure diIsorder no active seizure noted we will continue with supportive care. 3. Acute DVT treated we will continue to follow along  4. Interstitial pneumonia we will continue with supportive care. 5. ARDS slowly improving patient has been tolerating weaning better   I have personally seen and evaluated the patient, evaluated laboratory and imaging results, formulated the assessment and plan and placed orders. The Patient requires high complexity decision making for assessment and support.  Case was discussed on Rounds with the Respiratory Therapy Staff  Allyne Gee, MD  [facility] Pulmonary Critical Care Medicine Sleep Medicine

## 2018-12-27 LAB — BRAIN NATRIURETIC PEPTIDE: B Natriuretic Peptide: 46.5 pg/mL (ref 0.0–100.0)

## 2018-12-28 ENCOUNTER — Other Ambulatory Visit (HOSPITAL_COMMUNITY): Payer: Medicare HMO

## 2018-12-28 LAB — CBC
HCT: 36 % (ref 36.0–46.0)
Hemoglobin: 10.8 g/dL — ABNORMAL LOW (ref 12.0–15.0)
MCH: 24 pg — ABNORMAL LOW (ref 26.0–34.0)
MCHC: 30 g/dL (ref 30.0–36.0)
MCV: 80 fL (ref 80.0–100.0)
Platelets: 327 10*3/uL (ref 150–400)
RBC: 4.5 MIL/uL (ref 3.87–5.11)
RDW: 17 % — ABNORMAL HIGH (ref 11.5–15.5)
WBC: 13.3 10*3/uL — ABNORMAL HIGH (ref 4.0–10.5)
nRBC: 0 % (ref 0.0–0.2)

## 2018-12-28 LAB — BASIC METABOLIC PANEL
Anion gap: 15 (ref 5–15)
BUN: 16 mg/dL (ref 8–23)
CO2: 31 mmol/L (ref 22–32)
Calcium: 9.7 mg/dL (ref 8.9–10.3)
Chloride: 91 mmol/L — ABNORMAL LOW (ref 98–111)
Creatinine, Ser: 0.44 mg/dL (ref 0.44–1.00)
GFR calc Af Amer: >60 mL/min (ref >60)
GFR calc non Af Amer: >60 mL/min (ref >60)
Glucose, Bld: 101 mg/dL — ABNORMAL HIGH (ref 70–99)
Potassium: 3.8 mmol/L (ref 3.5–5.1)
Sodium: 137 mmol/L (ref 135–145)

## 2018-12-29 ENCOUNTER — Other Ambulatory Visit (HOSPITAL_COMMUNITY): Payer: Medicare HMO

## 2018-12-29 DIAGNOSIS — J80 Acute respiratory distress syndrome: Secondary | ICD-10-CM | POA: Diagnosis not present

## 2018-12-29 DIAGNOSIS — I82412 Acute embolism and thrombosis of left femoral vein: Secondary | ICD-10-CM | POA: Diagnosis not present

## 2018-12-29 DIAGNOSIS — J9621 Acute and chronic respiratory failure with hypoxia: Secondary | ICD-10-CM | POA: Diagnosis not present

## 2018-12-29 DIAGNOSIS — J849 Interstitial pulmonary disease, unspecified: Secondary | ICD-10-CM | POA: Diagnosis not present

## 2018-12-29 LAB — CULTURE, RESPIRATORY W GRAM STAIN

## 2018-12-29 NOTE — Progress Notes (Signed)
Pulmonary Critical Care Medicine [place]   PULMONARY CRITICAL CARE SERVICE  PROGRESS NOTE  Date of Service: 12/29/2018  [name] Fitzpatrick  [id number]  DOB: 09-10-1955   DOA: 12/10/2018  Referring Physician: Merton Border, MD  HPI: [name] Fitzpatrick is a 63 y.o. female seen for follow up of Acute on Chronic Respiratory Failure.  Patient currently is on T collar has been on 40% FiO2 saturations have actually been excellent and she should be able to wean down on the FiO2  Medications: Reviewed on Rounds  Physical Exam:  Vitals: Temperature 98.2 pulse 104 respiratory rate 29 blood pressure 121/68 saturations 96%  Ventilator Settings off ventilator on T collar  . General: Comfortable at this time . Eyes: Grossly normal lids, irises & conjunctiva . ENT: grossly tongue is normal . Neck: no obvious mass . Cardiovascular: S1 S2 normal no gallop . Respiratory: No rhonchi no rales are noted at this time . Abdomen: soft . Skin: no rash seen on limited exam . Musculoskeletal: not rigid . Psychiatric:unable to assess . Neurologic: no seizure no involuntary movements         Lab Data:   Basic Metabolic Panel: Recent Labs  Lab 12/25/18 1248 12/28/18 1159  NA 138 137  K 3.7 3.8  CL 96* 91*  CO2 31 31  GLUCOSE 124* 101*  BUN 11 16  CREATININE 0.41* 0.44  CALCIUM 9.5 9.7  MG 1.7  --     ABG: Recent Labs  Lab 12/25/18 1230  PHART 7.410  PCO2ART 54.7*  PO2ART 110*  HCO3 34.0*  O2SAT 98.1    Liver Function Tests: No results for input(s): AST, ALT, ALKPHOS, BILITOT, PROT, ALBUMIN in the last 168 hours. No results for input(s): LIPASE, AMYLASE in the last 168 hours. No results for input(s): AMMONIA in the last 168 hours.  CBC: Recent Labs  Lab 12/25/18 1248 12/28/18 1159  WBC 10.8* 13.3*  HGB 9.7* 10.8*  HCT 33.0* 36.0  MCV 81.5 80.0  PLT 260 327    Cardiac Enzymes: No results for input(s): CKTOTAL, CKMB, CKMBINDEX, TROPONINI in  the last 168 hours.  BNP (last 3 results) Recent Labs    12/27/18 0505  BNP 46.5    ProBNP (last 3 results) No results for input(s): PROBNP in the last 8760 hours.  Radiological Exams: Dg Chest Port 1 View  Result Date: 12/28/2018 CLINICAL DATA:  Pulmonary edema Update status EXAM: PORTABLE CHEST 1 VIEW COMPARISON:  12/25/2018 FINDINGS: Tracheostomy tube unchanged. Low lung volumes. Stable enlarged cardiac silhouette. There is diffuse fine airspace disease which is slightly worsened from comparison exam. IMPRESSION: Mild increase in diffuse airspace base disease suggesting pulmonary edema. Overall minimal change. Electronically Signed   By: Suzy Bouchard M.D.   On: 12/28/2018 08:08    Assessment/Plan Active Problems:   Acute on chronic respiratory failure with hypoxia (HCC)   Seizure disorder (HCC)   Acute deep vein thrombosis (DVT) of left femoral vein (HCC)   Interstitial pneumonia (HCC)   ARDS (adult respiratory distress syndrome) ([place])   1. Acute on chronic respiratory failure hypoxia patient is gradually improving with oxygen requirements at 40% spoke with the primary care team will bring her FiO2 down since saturations are very good right now 2. Seizure disorder no active seizure noted at this time 3. Acute DVT treated 4. Interstitial pneumonia patient had some increase in airspace disease suggestive of pulmonary edema will recommend diuresis 5. ARDS slowly improving continue present management   I  have personally seen and evaluated the patient, evaluated laboratory and imaging results, formulated the assessment and plan and placed orders. The Patient requires high complexity decision making for assessment and support.  Case was discussed on Rounds with the Respiratory Therapy Staff  Allyne Gee, MD [facility] Pulmonary Critical Care Medicine Sleep Medicine

## 2018-12-30 DIAGNOSIS — J849 Interstitial pulmonary disease, unspecified: Secondary | ICD-10-CM | POA: Diagnosis not present

## 2018-12-30 DIAGNOSIS — J9621 Acute and chronic respiratory failure with hypoxia: Secondary | ICD-10-CM | POA: Diagnosis not present

## 2018-12-30 DIAGNOSIS — I82412 Acute embolism and thrombosis of left femoral vein: Secondary | ICD-10-CM | POA: Diagnosis not present

## 2018-12-30 DIAGNOSIS — J80 Acute respiratory distress syndrome: Secondary | ICD-10-CM | POA: Diagnosis not present

## 2018-12-30 NOTE — Progress Notes (Signed)
Pulmonary Critical Care Medicine [place]   PULMONARY CRITICAL CARE SERVICE  PROGRESS NOTE  Date of Service: 12/30/2018  [name] Fitzpatrick  [id number]  DOB: 03-15-1956   DOA: 12/10/2018  Referring Physician: Merton Border, MD  HPI: [name] Fitzpatrick is a 63 y.o. female seen for follow up of Acute on Chronic Respiratory Failure.  Patient is on T collar at 7 desaturations noted at nighttime.  Medications: Reviewed on Rounds  Physical Exam:  Vitals: Temperature 97.5 pulse 95 respiratory 18 blood pressure 133/84 saturations 99%  Ventilator Settings on T collar currently on 40% FiO2  . General: Comfortable at this time . Eyes: Grossly normal lids, irises & conjunctiva . ENT: grossly tongue is normal . Neck: no obvious mass . Cardiovascular: S1 S2 normal no gallop . Respiratory: No rhonchi no rales are noted at this time . Abdomen: soft . Skin: no rash seen on limited exam . Musculoskeletal: not rigid . Psychiatric:unable to assess . Neurologic: no seizure no involuntary movements         Lab Data:   Basic Metabolic Panel: Recent Labs  Lab 12/25/18 1248 12/28/18 1159  NA 138 137  K 3.7 3.8  CL 96* 91*  CO2 31 31  GLUCOSE 124* 101*  BUN 11 16  CREATININE 0.41* 0.44  CALCIUM 9.5 9.7  MG 1.7  --     ABG: Recent Labs  Lab 12/25/18 1230  PHART 7.410  PCO2ART 54.7*  PO2ART 110*  HCO3 34.0*  O2SAT 98.1    Liver Function Tests: No results for input(s): AST, ALT, ALKPHOS, BILITOT, PROT, ALBUMIN in the last 168 hours. No results for input(s): LIPASE, AMYLASE in the last 168 hours. No results for input(s): AMMONIA in the last 168 hours.  CBC: Recent Labs  Lab 12/25/18 1248 12/28/18 1159  WBC 10.8* 13.3*  HGB 9.7* 10.8*  HCT 33.0* 36.0  MCV 81.5 80.0  PLT 260 327    Cardiac Enzymes: No results for input(s): CKTOTAL, CKMB, CKMBINDEX, TROPONINI in the last 168 hours.  BNP (last 3 results) Recent Labs    12/27/18 0505   BNP 46.5    ProBNP (last 3 results) No results for input(s): PROBNP in the last 8760 hours.  Radiological Exams: Dg Hand Complete Left  Result Date: 12/29/2018 CLINICAL DATA:  Decreased range of motion of finger of left hand. EXAM: LEFT HAND - COMPLETE 3+ VIEW COMPARISON:  None. FINDINGS: There is no evidence of fracture or dislocation. There is no evidence of arthropathy or other focal bone abnormality. Soft tissues are unremarkable. IMPRESSION: Negative. Electronically Signed   By: Fidela Salisbury M.D.   On: 12/29/2018 14:55    Assessment/Plan Active Problems:   Acute on chronic respiratory failure with hypoxia (HCC)   Seizure disorder (HCC)   Acute deep vein thrombosis (DVT) of left femoral vein (HCC)   Interstitial pneumonia (HCC)   ARDS (adult respiratory distress syndrome) ([place])   1. Acute on chronic respiratory failure with hypoxia we will continue with T collar trials currently on 40% patient has had some issues with desaturation at nighttime we will continue to follow along there is also some concern about fluid overload on the last chest x-ray 2. Seizure disorder continue with supportive care no active seizures 3. Acute DVT treated 4. Interstitial pneumonia patient reportedly has interstitial lung disease from the other facility 5. ARDS slowly improving still with some fluid overload noted   I have personally seen and evaluated the patient, evaluated laboratory and  imaging results, formulated the assessment and plan and placed orders. The Patient requires high complexity decision making for assessment and support.  Case was discussed on Rounds with the Respiratory Therapy Staff  Allyne Gee, MD [facility] Pulmonary Critical Care Medicine Sleep Medicine

## 2018-12-31 ENCOUNTER — Other Ambulatory Visit (HOSPITAL_COMMUNITY): Payer: Medicare HMO

## 2018-12-31 DIAGNOSIS — J849 Interstitial pulmonary disease, unspecified: Secondary | ICD-10-CM | POA: Diagnosis not present

## 2018-12-31 DIAGNOSIS — J9621 Acute and chronic respiratory failure with hypoxia: Secondary | ICD-10-CM | POA: Diagnosis not present

## 2018-12-31 DIAGNOSIS — J80 Acute respiratory distress syndrome: Secondary | ICD-10-CM | POA: Diagnosis not present

## 2018-12-31 DIAGNOSIS — I82412 Acute embolism and thrombosis of left femoral vein: Secondary | ICD-10-CM | POA: Diagnosis not present

## 2018-12-31 LAB — CBC
HCT: 36.4 % (ref 36.0–46.0)
Hemoglobin: 10.9 g/dL — ABNORMAL LOW (ref 12.0–15.0)
MCH: 24.1 pg — ABNORMAL LOW (ref 26.0–34.0)
MCHC: 29.9 g/dL — ABNORMAL LOW (ref 30.0–36.0)
MCV: 80.4 fL (ref 80.0–100.0)
Platelets: 382 10*3/uL (ref 150–400)
RBC: 4.53 MIL/uL (ref 3.87–5.11)
RDW: 16.9 % — ABNORMAL HIGH (ref 11.5–15.5)
WBC: 11.2 10*3/uL — ABNORMAL HIGH (ref 4.0–10.5)
nRBC: 0 % (ref 0.0–0.2)

## 2018-12-31 LAB — RENAL FUNCTION PANEL
Albumin: 3 g/dL — ABNORMAL LOW (ref 3.5–5.0)
Anion gap: 15 (ref 5–15)
BUN: 20 mg/dL (ref 8–23)
CO2: 34 mmol/L — ABNORMAL HIGH (ref 22–32)
Calcium: 9.9 mg/dL (ref 8.9–10.3)
Chloride: 87 mmol/L — ABNORMAL LOW (ref 98–111)
Creatinine, Ser: 0.45 mg/dL (ref 0.44–1.00)
GFR calc Af Amer: >60 mL/min (ref >60)
GFR calc non Af Amer: >60 mL/min (ref >60)
Glucose, Bld: 113 mg/dL — ABNORMAL HIGH (ref 70–99)
Phosphorus: 4.3 mg/dL (ref 2.5–4.6)
Potassium: 3.3 mmol/L — ABNORMAL LOW (ref 3.5–5.1)
Sodium: 136 mmol/L (ref 135–145)

## 2018-12-31 LAB — MAGNESIUM: Magnesium: 1.7 mg/dL (ref 1.7–2.4)

## 2018-12-31 LAB — VANCOMYCIN, TROUGH: Vancomycin Tr: 7 ug/mL — ABNORMAL LOW (ref 15–20)

## 2018-12-31 NOTE — Progress Notes (Signed)
Pulmonary Critical Care Medicine [place]   PULMONARY CRITICAL CARE SERVICE  PROGRESS NOTE  Date of Service: 12/31/2018  [name] Fitzpatrick  [id number]  DOB: 1955/10/13   DOA: 12/10/2018  Referring Physician: Merton Border, MD  HPI: [name] Fitzpatrick is a 63 y.o. female seen for follow up of Acute on Chronic Respiratory Failure.  Patient is currently on T collar has been on 35% FiO2 had a swallowing study done today which she was able to pass  Medications: Reviewed on Rounds  Physical Exam:  Vitals: Temperature is 95.9 pulse 80 respiratory rate 28 blood pressure 136/76 saturations 98%  Ventilator Settings off the ventilator on T collar currently  . General: Comfortable at this time . Eyes: Grossly normal lids, irises & conjunctiva . ENT: grossly tongue is normal . Neck: no obvious mass . Cardiovascular: S1 S2 normal no gallop . Respiratory: No rhonchi no rales are noted at this time . Abdomen: soft . Skin: no rash seen on limited exam . Musculoskeletal: not rigid . Psychiatric:unable to assess . Neurologic: no seizure no involuntary movements         Lab Data:   Basic Metabolic Panel: Recent Labs  Lab 12/25/18 1248 12/28/18 1159 12/31/18 0714  NA 138 137 136  K 3.7 3.8 3.3*  CL 96* 91* 87*  CO2 31 31 34*  GLUCOSE 124* 101* 113*  BUN 11 16 20   CREATININE 0.41* 0.44 0.45  CALCIUM 9.5 9.7 9.9  MG 1.7  --  1.7  PHOS  --   --  4.3    ABG: Recent Labs  Lab 12/25/18 1230  PHART 7.410  PCO2ART 54.7*  PO2ART 110*  HCO3 34.0*  O2SAT 98.1    Liver Function Tests: Recent Labs  Lab 12/31/18 0714  ALBUMIN 3.0*   No results for input(s): LIPASE, AMYLASE in the last 168 hours. No results for input(s): AMMONIA in the last 168 hours.  CBC: Recent Labs  Lab 12/25/18 1248 12/28/18 1159 12/31/18 0714  WBC 10.8* 13.3* 11.2*  HGB 9.7* 10.8* 10.9*  HCT 33.0* 36.0 36.4  MCV 81.5 80.0 80.4  PLT 260 327 382    Cardiac  Enzymes: No results for input(s): CKTOTAL, CKMB, CKMBINDEX, TROPONINI in the last 168 hours.  BNP (last 3 results) Recent Labs    12/27/18 0505  BNP 46.5    ProBNP (last 3 results) No results for input(s): PROBNP in the last 8760 hours.  Radiological Exams: No results found.  Assessment/Plan Active Problems:   Acute on chronic respiratory failure with hypoxia (HCC)   Seizure disorder (HCC)   Acute deep vein thrombosis (DVT) of left femoral vein (HCC)   Interstitial pneumonia (HCC)   ARDS (adult respiratory distress syndrome) ([place])   1. Acute on chronic respiratory failure with hypoxia patient currently is doing well with T collar is passed swallowing study continue with T collar as tolerated advanced possibly to capping 2. Seizure disorder no active seizures 3. Acute DVT treated 4. *Pneumonia stabilized 5. ARDS clinically improved   I have personally seen and evaluated the patient, evaluated laboratory and imaging results, formulated the assessment and plan and placed orders. The Patient requires high complexity decision making for assessment and support.  Case was discussed on Rounds with the Respiratory Therapy Staff  Allyne Gee, MD [facility] Pulmonary Critical Care Medicine Sleep Medicine

## 2019-01-01 DIAGNOSIS — J80 Acute respiratory distress syndrome: Secondary | ICD-10-CM | POA: Diagnosis not present

## 2019-01-01 DIAGNOSIS — J9621 Acute and chronic respiratory failure with hypoxia: Secondary | ICD-10-CM | POA: Diagnosis not present

## 2019-01-01 DIAGNOSIS — J849 Interstitial pulmonary disease, unspecified: Secondary | ICD-10-CM | POA: Diagnosis not present

## 2019-01-01 DIAGNOSIS — I82412 Acute embolism and thrombosis of left femoral vein: Secondary | ICD-10-CM | POA: Diagnosis not present

## 2019-01-01 LAB — POTASSIUM: Potassium: 3.6 mmol/L (ref 3.5–5.1)

## 2019-01-01 LAB — VANCOMYCIN, TROUGH: Vancomycin Tr: 28 ug/mL (ref 15–20)

## 2019-01-01 NOTE — Progress Notes (Signed)
Pulmonary Critical Care Medicine [place]   PULMONARY CRITICAL CARE SERVICE  PROGRESS NOTE  Date of Service: 01/01/2019  SHADIAMON OSMON  [id number]  DOB: 07-Mar-1956   DOA: 12/10/2018  Referring Physician: Merton Border, MD  HPI: [name] Fitzpatrick is a 63 y.o. female seen for follow up of Acute on Chronic Respiratory Failure.  Patient is capping right now has been on 4 L oxygen and is going to be meeting 24 hours goal today  Medications: Reviewed on Rounds  Physical Exam:  Vitals: Temperature 96.9 pulse 82 respiratory 18 blood pressure 127/80 saturations 100%  Ventilator Settings capping on 4 L  . General: Comfortable at this time . Eyes: Grossly normal lids, irises & conjunctiva . ENT: grossly tongue is normal . Neck: no obvious mass . Cardiovascular: S1 S2 normal no gallop . Respiratory: No rhonchi no rales are noted at this time . Abdomen: soft . Skin: no rash seen on limited exam . Musculoskeletal: not rigid . Psychiatric:unable to assess . Neurologic: no seizure no involuntary movements         Lab Data:   Basic Metabolic Panel: Recent Labs  Lab 12/28/18 1159 12/31/18 0714 01/01/19 0838  NA 137 136  --   K 3.8 3.3* 3.6  CL 91* 87*  --   CO2 31 34*  --   GLUCOSE 101* 113*  --   BUN 16 20  --   CREATININE 0.44 0.45  --   CALCIUM 9.7 9.9  --   MG  --  1.7  --   PHOS  --  4.3  --     ABG: No results for input(s): PHART, PCO2ART, PO2ART, HCO3, O2SAT in the last 168 hours.  Liver Function Tests: Recent Labs  Lab 12/31/18 0714  ALBUMIN 3.0*   No results for input(s): LIPASE, AMYLASE in the last 168 hours. No results for input(s): AMMONIA in the last 168 hours.  CBC: Recent Labs  Lab 12/28/18 1159 12/31/18 0714  WBC 13.3* 11.2*  HGB 10.8* 10.9*  HCT 36.0 36.4  MCV 80.0 80.4  PLT 327 382    Cardiac Enzymes: No results for input(s): CKTOTAL, CKMB, CKMBINDEX, TROPONINI in the last 168 hours.  BNP (last 3  results) Recent Labs    12/27/18 0505  BNP 46.5    ProBNP (last 3 results) No results for input(s): PROBNP in the last 8760 hours.  Radiological Exams: No results found.  Assessment/Plan Active Problems:   Acute on chronic respiratory failure with hypoxia (HCC)   Seizure disorder (HCC)   Acute deep vein thrombosis (DVT) of left femoral vein (HCC)   Interstitial pneumonia (HCC)   ARDS (adult respiratory distress syndrome) ([place])   1. Acute on chronic respiratory failure hypoxia patient continues to do fine with capping at this time goal today will be 24 hours we will continue to monitor hopefully working towards decannulation 2. Seizure disorder no active seizure noted at this time 3. DVT treated 4. Interstitial pneumonia gradually improved baseline 5. ARDS clearing slowly   I have personally seen and evaluated the patient, evaluated laboratory and imaging results, formulated the assessment and plan and placed orders. The Patient requires high complexity decision making for assessment and support.  Case was discussed on Rounds with the Respiratory Therapy Staff  Allyne Gee, MD [facility] Pulmonary Critical Care Medicine Sleep Medicine

## 2019-01-02 DIAGNOSIS — I82412 Acute embolism and thrombosis of left femoral vein: Secondary | ICD-10-CM | POA: Diagnosis not present

## 2019-01-02 DIAGNOSIS — J9621 Acute and chronic respiratory failure with hypoxia: Secondary | ICD-10-CM | POA: Diagnosis not present

## 2019-01-02 DIAGNOSIS — J849 Interstitial pulmonary disease, unspecified: Secondary | ICD-10-CM | POA: Diagnosis not present

## 2019-01-02 DIAGNOSIS — J80 Acute respiratory distress syndrome: Secondary | ICD-10-CM | POA: Diagnosis not present

## 2019-01-02 LAB — VANCOMYCIN, TROUGH: Vancomycin Tr: 16 ug/mL (ref 15–20)

## 2019-01-02 NOTE — Progress Notes (Addendum)
Pulmonary Critical Care Medicine [place]   PULMONARY CRITICAL CARE SERVICE  PROGRESS NOTE  Date of Service: 01/02/2019  JERMANY WAN  [id number]  DOB: 08-11-1955   DOA: 12/10/2018  Referring Physician: Merton Border, MD  HPI: [name] Fitzpatrick is a 63 y.o. female seen for follow up of Acute on Chronic Respiratory Failure.  Patient For 48 hours currently on 3 L oxygen via nasal cannula satting well in no distress.  Medications: Reviewed on Rounds  Physical Exam:  Vitals: Pulse 77 respiration 16 BP 121/74 O2 sat 100% 7.2  Ventilator Settings 3 L nasal cannula  . General: Comfortable at this time . Eyes: Grossly normal lids, irises & conjunctiva . ENT: grossly tongue is normal . Neck: no obvious mass . Cardiovascular: S1 S2 normal no gallop . Respiratory: No rales or rhonchi noted . Abdomen: soft . Skin: no rash seen on limited exam . Musculoskeletal: not rigid . Psychiatric:unable to assess . Neurologic: no seizure no involuntary movements         Lab Data:   Basic Metabolic Panel: Recent Labs  Lab 12/28/18 1159 12/31/18 0714 01/01/19 0838  NA 137 136  --   K 3.8 3.3* 3.6  CL 91* 87*  --   CO2 31 34*  --   GLUCOSE 101* 113*  --   BUN 16 20  --   CREATININE 0.44 0.45  --   CALCIUM 9.7 9.9  --   MG  --  1.7  --   PHOS  --  4.3  --     ABG: No results for input(s): PHART, PCO2ART, PO2ART, HCO3, O2SAT in the last 168 hours.  Liver Function Tests: Recent Labs  Lab 12/31/18 0714  ALBUMIN 3.0*   No results for input(s): LIPASE, AMYLASE in the last 168 hours. No results for input(s): AMMONIA in the last 168 hours.  CBC: Recent Labs  Lab 12/28/18 1159 12/31/18 0714  WBC 13.3* 11.2*  HGB 10.8* 10.9*  HCT 36.0 36.4  MCV 80.0 80.4  PLT 327 382    Cardiac Enzymes: No results for input(s): CKTOTAL, CKMB, CKMBINDEX, TROPONINI in the last 168 hours.  BNP (last 3 results) Recent Labs    12/27/18 0505  BNP 46.5     ProBNP (last 3 results) No results for input(s): PROBNP in the last 8760 hours.  Radiological Exams: No results found.  Assessment/Plan Active Problems:   Acute on chronic respiratory failure with hypoxia (HCC)   Seizure disorder (HCC)   Acute deep vein thrombosis (DVT) of left femoral vein (HCC)   Interstitial pneumonia (HCC)   ARDS (adult respiratory distress syndrome) ([place])   1. Acute on chronic respiratory failure hypoxia patient is doing well and has been capped for 48 hours.  Remains on 3 L cannula continue supportive measures and pulmonary toilet. 2. Seizure disorder no active seizure noted at this time 3. DVT treated 4. Interstitial pneumonia gradually improved baseline 5. ARDS clearing slowly   I have personally seen and evaluated the patient, evaluated laboratory and imaging results, formulated the assessment and plan and placed orders. The Patient requires high complexity decision making for assessment and support.  Case was discussed on Rounds with the Respiratory Therapy Staff  Allyne Gee, MD [facility] Pulmonary Critical Care Medicine Sleep Medicine

## 2019-01-03 DIAGNOSIS — J9621 Acute and chronic respiratory failure with hypoxia: Secondary | ICD-10-CM | POA: Diagnosis not present

## 2019-01-03 DIAGNOSIS — J80 Acute respiratory distress syndrome: Secondary | ICD-10-CM | POA: Diagnosis not present

## 2019-01-03 DIAGNOSIS — J849 Interstitial pulmonary disease, unspecified: Secondary | ICD-10-CM | POA: Diagnosis not present

## 2019-01-03 DIAGNOSIS — I82412 Acute embolism and thrombosis of left femoral vein: Secondary | ICD-10-CM | POA: Diagnosis not present

## 2019-01-03 NOTE — Progress Notes (Addendum)
Pulmonary Critical Care Medicine [place]   PULMONARY CRITICAL CARE SERVICE  PROGRESS NOTE  Date of Service: 01/03/2019  [name] Fitzpatrick  [id number]  DOB: 21-Nov-1955   DOA: 12/10/2018  Referring Physician: Merton Border, MD  HPI: [name] Fitzpatrick is a 63 y.o. female seen for follow up of Acute on Chronic Respiratory Failure.  Patient is currently capped on 3 L nasal cannula will be decannulated today.  Medications: Reviewed on Rounds  Physical Exam:  Vitals: Pulse 88 respiration 17 BP 130/80 O2 sat 98% temp 97.6  Ventilator Settings 3 L nasal cannula  . General: Comfortable at this time . Eyes: Grossly normal lids, irises & conjunctiva . ENT: grossly tongue is normal . Neck: no obvious mass . Cardiovascular: S1 S2 normal no gallop . Respiratory: No rales or rhonchi noted . Abdomen: soft . Skin: no rash seen on limited exam . Musculoskeletal: not rigid . Psychiatric:unable to assess . Neurologic: no seizure no involuntary movements         Lab Data:   Basic Metabolic Panel: Recent Labs  Lab 12/28/18 1159 12/31/18 0714 01/01/19 0838  NA 137 136  --   K 3.8 3.3* 3.6  CL 91* 87*  --   CO2 31 34*  --   GLUCOSE 101* 113*  --   BUN 16 20  --   CREATININE 0.44 0.45  --   CALCIUM 9.7 9.9  --   MG  --  1.7  --   PHOS  --  4.3  --     ABG: No results for input(s): PHART, PCO2ART, PO2ART, HCO3, O2SAT in the last 168 hours.  Liver Function Tests: Recent Labs  Lab 12/31/18 0714  ALBUMIN 3.0*   No results for input(s): LIPASE, AMYLASE in the last 168 hours. No results for input(s): AMMONIA in the last 168 hours.  CBC: Recent Labs  Lab 12/28/18 1159 12/31/18 0714  WBC 13.3* 11.2*  HGB 10.8* 10.9*  HCT 36.0 36.4  MCV 80.0 80.4  PLT 327 382    Cardiac Enzymes: No results for input(s): CKTOTAL, CKMB, CKMBINDEX, TROPONINI in the last 168 hours.  BNP (last 3 results) Recent Labs    12/27/18 0505  BNP 46.5    ProBNP  (last 3 results) No results for input(s): PROBNP in the last 8760 hours.  Radiological Exams: No results found.  Assessment/Plan Active Problems:   Acute on chronic respiratory failure with hypoxia (HCC)   Seizure disorder (HCC)   Acute deep vein thrombosis (DVT) of left femoral vein (HCC)   Interstitial pneumonia (HCC)   ARDS (adult respiratory distress syndrome) ([place])   1. Acute on chronic respiratory failure hypoxia patient is doing well will be decannulated today.  Continue on 3 L nasal cannula.  Continue supportive measures 2. Seizure disorder no active seizure noted at this time 3. DVT treated 4. Interstitial pneumonia gradually improved baseline 5. ARDS clearing slowly   I have personally seen and evaluated the patient, evaluated laboratory and imaging results, formulated the assessment and plan and placed orders. The Patient requires high complexity decision making for assessment and support.  Case was discussed on Rounds with the Respiratory Therapy Staff  Allyne Gee, MD [facility] Pulmonary Critical Care Medicine Sleep Medicine

## 2019-01-04 DIAGNOSIS — J80 Acute respiratory distress syndrome: Secondary | ICD-10-CM | POA: Diagnosis not present

## 2019-01-04 DIAGNOSIS — J849 Interstitial pulmonary disease, unspecified: Secondary | ICD-10-CM | POA: Diagnosis not present

## 2019-01-04 DIAGNOSIS — J9621 Acute and chronic respiratory failure with hypoxia: Secondary | ICD-10-CM | POA: Diagnosis not present

## 2019-01-04 DIAGNOSIS — I82412 Acute embolism and thrombosis of left femoral vein: Secondary | ICD-10-CM | POA: Diagnosis not present

## 2019-01-05 ENCOUNTER — Other Ambulatory Visit (HOSPITAL_COMMUNITY): Payer: Medicare HMO

## 2019-01-05 LAB — CBC
HCT: 31.5 % — ABNORMAL LOW (ref 36.0–46.0)
Hemoglobin: 9.6 g/dL — ABNORMAL LOW (ref 12.0–15.0)
MCH: 24.2 pg — ABNORMAL LOW (ref 26.0–34.0)
MCHC: 30.5 g/dL (ref 30.0–36.0)
MCV: 79.5 fL — ABNORMAL LOW (ref 80.0–100.0)
Platelets: 335 10*3/uL (ref 150–400)
RBC: 3.96 MIL/uL (ref 3.87–5.11)
RDW: 16.6 % — ABNORMAL HIGH (ref 11.5–15.5)
WBC: 13.9 10*3/uL — ABNORMAL HIGH (ref 4.0–10.5)
nRBC: 0 % (ref 0.0–0.2)

## 2019-01-05 LAB — PHOSPHORUS: Phosphorus: 4.3 mg/dL (ref 2.5–4.6)

## 2019-01-05 LAB — BASIC METABOLIC PANEL
Anion gap: 12 (ref 5–15)
BUN: 9 mg/dL (ref 8–23)
CO2: 26 mmol/L (ref 22–32)
Calcium: 9.1 mg/dL (ref 8.9–10.3)
Chloride: 101 mmol/L (ref 98–111)
Creatinine, Ser: 0.37 mg/dL — ABNORMAL LOW (ref 0.44–1.00)
GFR calc Af Amer: >60 mL/min (ref >60)
GFR calc non Af Amer: >60 mL/min (ref >60)
Glucose, Bld: 85 mg/dL (ref 70–99)
Potassium: 3.7 mmol/L (ref 3.5–5.1)
Sodium: 139 mmol/L (ref 135–145)

## 2019-01-05 LAB — MAGNESIUM: Magnesium: 1.7 mg/dL (ref 1.7–2.4)

## 2019-01-05 LAB — VANCOMYCIN, TROUGH: Vancomycin Tr: 30 ug/mL (ref 15–20)

## 2019-01-05 NOTE — Progress Notes (Addendum)
Pulmonary Critical Care Medicine [address]   PULMONARY CRITICAL CARE SERVICE  PROGRESS NOTE  Date of Service: January 04, 2019  [name] Fitzpatrick  [id number]  DOB: [birth year]   DOA: 12/10/2018  Referring Physician: Merton Border, MD  HPI: [name] Fitzpatrick is a 63 y.o. female seen for follow up of Acute on Chronic Respiratory Failure.  Patient was decannulated yesterday doing well on 2 to 4 L of oxygen via nasal cannula.  Medications: Reviewed on Rounds  Physical Exam:  Vitals: Pulse 78 respirate 23 BP 116/54 O2 sat % 7.5  Ventilator Settings to 4 L nasal cannula  . General: Comfortable at this time . Eyes: Grossly normal lids, irises & conjunctiva . ENT: grossly tongue is normal . Neck: no obvious mass . Cardiovascular: S1 S2 normal no gallop . Respiratory: No rales or rhonchi noted . Abdomen: soft . Skin: no rash seen on limited exam . Musculoskeletal: not rigid . Psychiatric:unable to assess . Neurologic: no seizure no involuntary movements         Lab Data:   Basic Metabolic Panel: Recent Labs  Lab 12/31/18 0714 01/01/19 0838  NA 136  --   K 3.3* 3.6  CL 87*  --   CO2 34*  --   GLUCOSE 113*  --   BUN 20  --   CREATININE 0.45  --   CALCIUM 9.9  --   MG 1.7  --   PHOS 4.3  --     ABG: No results for input(s): PHART, PCO2ART, PO2ART, HCO3, O2SAT in the last 168 hours.  Liver Function Tests: Recent Labs  Lab 12/31/18 0714  ALBUMIN 3.0*   No results for input(s): LIPASE, AMYLASE in the last 168 hours. No results for input(s): AMMONIA in the last 168 hours.  CBC: Recent Labs  Lab 12/31/18 0714  WBC 11.2*  HGB 10.9*  HCT 36.4  MCV 80.4  PLT 382    Cardiac Enzymes: No results for input(s): CKTOTAL, CKMB, CKMBINDEX, TROPONINI in the last 168 hours.  BNP (last 3 results) Recent Labs    12/27/18 0505  BNP 46.5    ProBNP (last 3 results) No results for input(s): PROBNP in the last 8760 hours.  Radiological  Exams: No results found.  Assessment/Plan Active Problems:   Acute on chronic respiratory failure with hypoxia (HCC)   Seizure disorder (HCC)   Acute deep vein thrombosis (DVT) of left femoral vein (HCC)   Interstitial pneumonia (HCC)   ARDS (adult respiratory distress syndrome) ([place])   1. Acute on chronic respiratory failure hypoxia patient is doing well since decannulation.  We will continue on nasal cannula oxygen as needed.  Continue supportive measures and pulmonary toilet. 2. Seizure disorder no active seizure noted at this time 3. DVT treated 4. Interstitial pneumonia gradually improved baseline 5. ARDS clearing slowly   I have personally seen and evaluated the patient, evaluated laboratory and imaging results, formulated the assessment and plan and placed orders. The Patient requires high complexity decision making for assessment and support.  Case was discussed on Rounds with the Respiratory Therapy Staff  Allyne Gee, MD [facility] Pulmonary Critical Care Medicine Sleep Medicine

## 2019-01-06 ENCOUNTER — Other Ambulatory Visit (HOSPITAL_COMMUNITY): Payer: Medicare HMO

## 2019-01-06 LAB — MAGNESIUM: Magnesium: 1.8 mg/dL (ref 1.7–2.4)

## 2019-01-06 LAB — URINALYSIS, ROUTINE W REFLEX MICROSCOPIC
Bilirubin Urine: NEGATIVE
Glucose, UA: NEGATIVE mg/dL
Hgb urine dipstick: NEGATIVE
Ketones, ur: NEGATIVE mg/dL
Nitrite: NEGATIVE
Protein, ur: NEGATIVE mg/dL
Specific Gravity, Urine: 1.011 (ref 1.005–1.030)
pH: 6 (ref 5.0–8.0)

## 2019-01-06 LAB — CBC
HCT: 34.9 % — ABNORMAL LOW (ref 36.0–46.0)
Hemoglobin: 10.3 g/dL — ABNORMAL LOW (ref 12.0–15.0)
MCH: 23.8 pg — ABNORMAL LOW (ref 26.0–34.0)
MCHC: 29.5 g/dL — ABNORMAL LOW (ref 30.0–36.0)
MCV: 80.8 fL (ref 80.0–100.0)
Platelets: 336 10*3/uL (ref 150–400)
RBC: 4.32 MIL/uL (ref 3.87–5.11)
RDW: 16.6 % — ABNORMAL HIGH (ref 11.5–15.5)
WBC: 13.8 10*3/uL — ABNORMAL HIGH (ref 4.0–10.5)
nRBC: 0 % (ref 0.0–0.2)

## 2019-01-06 LAB — VANCOMYCIN, TROUGH: Vancomycin Tr: 10 ug/mL — ABNORMAL LOW (ref 15–20)

## 2019-01-07 LAB — URINE CULTURE: Culture: NO GROWTH

## 2019-01-07 LAB — EXPECTORATED SPUTUM ASSESSMENT W GRAM STAIN, RFLX TO RESP C

## 2019-01-08 LAB — CBC
HCT: 31.4 % — ABNORMAL LOW (ref 36.0–46.0)
Hemoglobin: 9.2 g/dL — ABNORMAL LOW (ref 12.0–15.0)
MCH: 23.8 pg — ABNORMAL LOW (ref 26.0–34.0)
MCHC: 29.3 g/dL — ABNORMAL LOW (ref 30.0–36.0)
MCV: 81.1 fL (ref 80.0–100.0)
Platelets: 320 10*3/uL (ref 150–400)
RBC: 3.87 MIL/uL (ref 3.87–5.11)
RDW: 16.7 % — ABNORMAL HIGH (ref 11.5–15.5)
WBC: 8 10*3/uL (ref 4.0–10.5)
nRBC: 0 % (ref 0.0–0.2)

## 2019-01-08 LAB — VANCOMYCIN, TROUGH: Vancomycin Tr: 8 ug/mL — ABNORMAL LOW (ref 15–20)

## 2019-01-21 ENCOUNTER — Ambulatory Visit: Payer: Medicare HMO | Admitting: Family Medicine

## 2019-03-14 ENCOUNTER — Telehealth (HOSPITAL_COMMUNITY): Payer: Self-pay | Admitting: *Deleted

## 2019-03-14 NOTE — Telephone Encounter (Signed)
Called to inform patient that we would be closing for a couple of months. She would like to try the virtual program while she waits. She said if she finds she can not do it on her own then she still wants to be called to come to then in gym program.  Russella Dar

## 2019-04-02 ENCOUNTER — Telehealth (HOSPITAL_COMMUNITY): Payer: Self-pay | Admitting: *Deleted

## 2019-04-02 NOTE — Telephone Encounter (Signed)
Called patient today to discuss activating the virtual app. We discovered that I had sent it to the wrong phone number. I recent the app to her smart phone and she agreed that she would take the time to set it up. I will call her tomorrow to go over the consent form and schedule her orientation visit. Patient agreed and expressed an understanding.

## 2019-04-06 ENCOUNTER — Ambulatory Visit (HOSPITAL_COMMUNITY)
Admission: RE | Admit: 2019-04-06 | Discharge: 2019-04-06 | Disposition: A | Discharge status: Home / Self Care | Payer: Medicare HMO | Source: Ambulatory Visit | Attending: Internal Medicine | Admitting: Internal Medicine

## 2019-04-06 ENCOUNTER — Other Ambulatory Visit: Payer: Self-pay

## 2019-04-06 NOTE — Progress Notes (Signed)
.         Confirm Consent - "In the setting of the current Covid19 crisis, you are scheduled for a phone visit with your Cardiac or Pulmonary team member on 04/06/19 at 2:30.  Just as we do with many in-gym visits, in order for you to participate in this visit, we must obtain consent.  If you'd like, I can send this to your mychart (if signed up) or email for you to review.  Otherwise, I can obtain your verbal consent now.  By agreeing to a telephone visit, we'd like you to understand that the technology does not allow for your Cardiac or Pulmonary Rehab team member to perform a physical assessment, and thus may limit their ability to fully assess your ability to perform exercise programs. If your provider identifies any concerns that need to be evaluated in person, we will make arrangements to do so).  Finally, though the technology is pretty good, we cannot assure that it will always work on either your or our end and we cannot ensure that we have a secure connection.  Cardiac and Pulmonary Rehab Telehealth visits and "At Home" cardiac and pulmonary rehab are provided at no cost to you. Are you willing to proceed?" STAFF: Did the patient verbally acknowledge consent to telehealth visit? Document YES/NO here: Yes   Spoke to the patient regarding Virtual Cardiac and Pulmonary Rehab. Pt was able to download the [employer] app on their smart device with no issues. Pt set up their account and received the following welcome message: "Welcome to the [place] and Pulmonary Rehabilitation program. We hope that you will find the exercise program beneficial in your recovery process. Our staff is available to assist with any questions/concerns about your exercise routine. Best wishes". There are brief orientation/education videos provided to you with the advisement to watch the them. They are located the education section under the Care Plan tab. Patient verbalized an understanding. We will continue to follow  and monitor patient progress with feedback needed.   Patient's orientation/assessment visit was scheduled today for 04/08/18 at 12:30.   Cardiac and Pulmonary Rehab Staff Date: 04/06/19    Time: 1430

## 2019-04-08 ENCOUNTER — Telehealth (HOSPITAL_COMMUNITY): Payer: Self-pay | Admitting: *Deleted

## 2019-04-08 NOTE — Telephone Encounter (Signed)
Patient called today to change her orientation appointment to 04/14/19 at 9 am. Her appointment has been changed.

## 2019-04-09 ENCOUNTER — Ambulatory Visit (HOSPITAL_COMMUNITY): Payer: Medicare HMO

## 2019-04-14 ENCOUNTER — Ambulatory Visit (HOSPITAL_COMMUNITY): Payer: Medicare HMO

## 2019-04-17 ENCOUNTER — Ambulatory Visit: Payer: Medicare HMO | Attending: Internal Medicine

## 2019-05-04 ENCOUNTER — Telehealth (HOSPITAL_COMMUNITY): Payer: Self-pay | Admitting: *Deleted

## 2019-05-04 NOTE — Telephone Encounter (Signed)
Called patient to see if she was still interested and she stated she is not interested at this time. Will cancel her referral.  [name] Fitzpatrick

## 2020-05-25 ENCOUNTER — Inpatient Hospital Stay (HOSPITAL_COMMUNITY)
Admission: EM | Admit: 2020-05-25 | Discharge: 2020-05-31 | DRG: 177 | Disposition: A | Discharge status: Home Health | Payer: Medicare Other | Source: Ambulatory Visit | Attending: Internal Medicine | Admitting: Internal Medicine

## 2020-05-25 ENCOUNTER — Other Ambulatory Visit: Payer: Self-pay

## 2020-05-25 ENCOUNTER — Encounter (HOSPITAL_COMMUNITY): Payer: Self-pay | Admitting: Emergency Medicine

## 2020-05-25 ENCOUNTER — Emergency Department (HOSPITAL_COMMUNITY): Payer: Medicare Other

## 2020-05-25 DIAGNOSIS — Z91040 Latex allergy status: Secondary | ICD-10-CM

## 2020-05-25 DIAGNOSIS — M1990 Unspecified osteoarthritis, unspecified site: Secondary | ICD-10-CM | POA: Diagnosis present

## 2020-05-25 DIAGNOSIS — G894 Chronic pain syndrome: Secondary | ICD-10-CM | POA: Diagnosis present

## 2020-05-25 DIAGNOSIS — G40909 Epilepsy, unspecified, not intractable, without status epilepticus: Secondary | ICD-10-CM | POA: Diagnosis present

## 2020-05-25 DIAGNOSIS — Z515 Encounter for palliative care: Secondary | ICD-10-CM | POA: Diagnosis not present

## 2020-05-25 DIAGNOSIS — Z66 Do not resuscitate: Secondary | ICD-10-CM | POA: Diagnosis present

## 2020-05-25 DIAGNOSIS — Z7982 Long term (current) use of aspirin: Secondary | ICD-10-CM

## 2020-05-25 DIAGNOSIS — F339 Major depressive disorder, recurrent, unspecified: Secondary | ICD-10-CM | POA: Diagnosis present

## 2020-05-25 DIAGNOSIS — J470 Bronchiectasis with acute lower respiratory infection: Secondary | ICD-10-CM | POA: Diagnosis present

## 2020-05-25 DIAGNOSIS — R1310 Dysphagia, unspecified: Secondary | ICD-10-CM | POA: Diagnosis present

## 2020-05-25 DIAGNOSIS — Z20822 Contact with and (suspected) exposure to covid-19: Secondary | ICD-10-CM | POA: Diagnosis present

## 2020-05-25 DIAGNOSIS — J439 Emphysema, unspecified: Secondary | ICD-10-CM | POA: Diagnosis present

## 2020-05-25 DIAGNOSIS — E890 Postprocedural hypothyroidism: Secondary | ICD-10-CM | POA: Diagnosis present

## 2020-05-25 DIAGNOSIS — R0600 Dyspnea, unspecified: Secondary | ICD-10-CM

## 2020-05-25 DIAGNOSIS — J984 Other disorders of lung: Secondary | ICD-10-CM | POA: Diagnosis present

## 2020-05-25 DIAGNOSIS — I2510 Atherosclerotic heart disease of native coronary artery without angina pectoris: Secondary | ICD-10-CM | POA: Diagnosis present

## 2020-05-25 DIAGNOSIS — J449 Chronic obstructive pulmonary disease, unspecified: Secondary | ICD-10-CM | POA: Diagnosis not present

## 2020-05-25 DIAGNOSIS — Z8701 Personal history of pneumonia (recurrent): Secondary | ICD-10-CM

## 2020-05-25 DIAGNOSIS — F4312 Post-traumatic stress disorder, chronic: Secondary | ICD-10-CM | POA: Diagnosis present

## 2020-05-25 DIAGNOSIS — J849 Interstitial pulmonary disease, unspecified: Secondary | ICD-10-CM | POA: Diagnosis present

## 2020-05-25 DIAGNOSIS — J8410 Pulmonary fibrosis, unspecified: Secondary | ICD-10-CM

## 2020-05-25 DIAGNOSIS — Z888 Allergy status to other drugs, medicaments and biological substances status: Secondary | ICD-10-CM

## 2020-05-25 DIAGNOSIS — Z62810 Personal history of physical and sexual abuse in childhood: Secondary | ICD-10-CM | POA: Diagnosis present

## 2020-05-25 DIAGNOSIS — Z79890 Hormone replacement therapy (postmenopausal): Secondary | ICD-10-CM

## 2020-05-25 DIAGNOSIS — I10 Essential (primary) hypertension: Secondary | ICD-10-CM | POA: Diagnosis present

## 2020-05-25 DIAGNOSIS — Z833 Family history of diabetes mellitus: Secondary | ICD-10-CM

## 2020-05-25 DIAGNOSIS — J9621 Acute and chronic respiratory failure with hypoxia: Secondary | ICD-10-CM | POA: Diagnosis present

## 2020-05-25 DIAGNOSIS — Z880 Allergy status to penicillin: Secondary | ICD-10-CM

## 2020-05-25 DIAGNOSIS — K219 Gastro-esophageal reflux disease without esophagitis: Secondary | ICD-10-CM | POA: Diagnosis present

## 2020-05-25 DIAGNOSIS — R911 Solitary pulmonary nodule: Secondary | ICD-10-CM | POA: Diagnosis present

## 2020-05-25 DIAGNOSIS — I959 Hypotension, unspecified: Secondary | ICD-10-CM | POA: Diagnosis present

## 2020-05-25 DIAGNOSIS — J9620 Acute and chronic respiratory failure, unspecified whether with hypoxia or hypercapnia: Secondary | ICD-10-CM | POA: Insufficient documentation

## 2020-05-25 DIAGNOSIS — Z981 Arthrodesis status: Secondary | ICD-10-CM

## 2020-05-25 DIAGNOSIS — F411 Generalized anxiety disorder: Secondary | ICD-10-CM | POA: Diagnosis present

## 2020-05-25 DIAGNOSIS — M797 Fibromyalgia: Secondary | ICD-10-CM | POA: Diagnosis present

## 2020-05-25 DIAGNOSIS — Z87891 Personal history of nicotine dependence: Secondary | ICD-10-CM

## 2020-05-25 DIAGNOSIS — Z8249 Family history of ischemic heart disease and other diseases of the circulatory system: Secondary | ICD-10-CM

## 2020-05-25 DIAGNOSIS — E063 Autoimmune thyroiditis: Secondary | ICD-10-CM | POA: Diagnosis present

## 2020-05-25 DIAGNOSIS — Z79899 Other long term (current) drug therapy: Secondary | ICD-10-CM

## 2020-05-25 DIAGNOSIS — J690 Pneumonitis due to inhalation of food and vomit: Secondary | ICD-10-CM | POA: Diagnosis present

## 2020-05-25 DIAGNOSIS — Z881 Allergy status to other antibiotic agents status: Secondary | ICD-10-CM

## 2020-05-25 DIAGNOSIS — Z8349 Family history of other endocrine, nutritional and metabolic diseases: Secondary | ICD-10-CM

## 2020-05-25 DIAGNOSIS — G8929 Other chronic pain: Secondary | ICD-10-CM | POA: Diagnosis not present

## 2020-05-25 DIAGNOSIS — Z87440 Personal history of urinary (tract) infections: Secondary | ICD-10-CM

## 2020-05-25 DIAGNOSIS — R0902 Hypoxemia: Secondary | ICD-10-CM

## 2020-05-25 DIAGNOSIS — E119 Type 2 diabetes mellitus without complications: Secondary | ICD-10-CM | POA: Diagnosis present

## 2020-05-25 DIAGNOSIS — K5900 Constipation, unspecified: Secondary | ICD-10-CM | POA: Diagnosis present

## 2020-05-25 DIAGNOSIS — Z9981 Dependence on supplemental oxygen: Secondary | ICD-10-CM

## 2020-05-25 DIAGNOSIS — M40204 Unspecified kyphosis, thoracic region: Secondary | ICD-10-CM | POA: Diagnosis present

## 2020-05-25 DIAGNOSIS — Z86718 Personal history of other venous thrombosis and embolism: Secondary | ICD-10-CM

## 2020-05-25 DIAGNOSIS — R0602 Shortness of breath: Secondary | ICD-10-CM

## 2020-05-25 HISTORY — DX: Personal history of other diseases of the respiratory system: Z87.09

## 2020-05-25 HISTORY — DX: Dyspnea, unspecified: R06.00

## 2020-05-25 LAB — CBC
HCT: 37.5 % (ref 36.0–46.0)
Hemoglobin: 11.7 g/dL — ABNORMAL LOW (ref 12.0–15.0)
MCH: 28.5 pg (ref 26.0–34.0)
MCHC: 31.2 g/dL (ref 30.0–36.0)
MCV: 91.5 fL (ref 80.0–100.0)
Platelets: 178 10*3/uL (ref 150–400)
RBC: 4.1 MIL/uL (ref 3.87–5.11)
RDW: 14.6 % (ref 11.5–15.5)
WBC: 8.7 10*3/uL (ref 4.0–10.5)
nRBC: 0 % (ref 0.0–0.2)

## 2020-05-25 LAB — URINALYSIS, ROUTINE W REFLEX MICROSCOPIC
Bacteria, UA: NONE SEEN
Bilirubin Urine: NEGATIVE
Glucose, UA: NEGATIVE mg/dL
Hgb urine dipstick: NEGATIVE
Ketones, ur: NEGATIVE mg/dL
Nitrite: NEGATIVE
Protein, ur: NEGATIVE mg/dL
Specific Gravity, Urine: 1.031 — ABNORMAL HIGH (ref 1.005–1.030)
pH: 7 (ref 5.0–8.0)

## 2020-05-25 LAB — I-STAT VENOUS BLOOD GAS, ED
Acid-Base Excess: 7 mmol/L — ABNORMAL HIGH (ref 0.0–2.0)
Bicarbonate: 30.8 mmol/L — ABNORMAL HIGH (ref 20.0–28.0)
Calcium, Ion: 1.15 mmol/L (ref 1.15–1.40)
HCT: 30 % — ABNORMAL LOW (ref 36.0–46.0)
Hemoglobin: 10.2 g/dL — ABNORMAL LOW (ref 12.0–15.0)
O2 Saturation: 97 %
Potassium: 4.1 mmol/L (ref 3.5–5.1)
Sodium: 135 mmol/L (ref 135–145)
TCO2: 32 mmol/L (ref 22–32)
pCO2, Ven: 41 mmHg — ABNORMAL LOW (ref 44.0–60.0)
pH, Ven: 7.483 — ABNORMAL HIGH (ref 7.250–7.430)
pO2, Ven: 82 mmHg — ABNORMAL HIGH (ref 32.0–45.0)

## 2020-05-25 LAB — COMPREHENSIVE METABOLIC PANEL
ALT: 16 U/L (ref 0–44)
AST: 17 U/L (ref 15–41)
Albumin: 2.9 g/dL — ABNORMAL LOW (ref 3.5–5.0)
Alkaline Phosphatase: 41 U/L (ref 38–126)
Anion gap: 9 (ref 5–15)
BUN: 17 mg/dL (ref 8–23)
CO2: 28 mmol/L (ref 22–32)
Calcium: 9.4 mg/dL (ref 8.9–10.3)
Chloride: 100 mmol/L (ref 98–111)
Creatinine, Ser: 0.83 mg/dL (ref 0.44–1.00)
GFR, Estimated: >60 mL/min (ref >60)
Glucose, Bld: 103 mg/dL — ABNORMAL HIGH (ref 70–99)
Potassium: 3.9 mmol/L (ref 3.5–5.1)
Sodium: 137 mmol/L (ref 135–145)
Total Bilirubin: 0.4 mg/dL (ref 0.3–1.2)
Total Protein: 5.4 g/dL — ABNORMAL LOW (ref 6.5–8.1)

## 2020-05-25 LAB — CBC WITH DIFFERENTIAL/PLATELET
Abs Immature Granulocytes: 0.06 10*3/uL (ref 0.00–0.07)
Basophils Absolute: 0 10*3/uL (ref 0.0–0.1)
Basophils Relative: 0 %
Eosinophils Absolute: 0.5 10*3/uL (ref 0.0–0.5)
Eosinophils Relative: 4 %
HCT: 35.1 % — ABNORMAL LOW (ref 36.0–46.0)
Hemoglobin: 10.8 g/dL — ABNORMAL LOW (ref 12.0–15.0)
Immature Granulocytes: 1 %
Lymphocytes Relative: 17 %
Lymphs Abs: 2.1 10*3/uL (ref 0.7–4.0)
MCH: 28.5 pg (ref 26.0–34.0)
MCHC: 30.8 g/dL (ref 30.0–36.0)
MCV: 92.6 fL (ref 80.0–100.0)
Monocytes Absolute: 0.8 10*3/uL (ref 0.1–1.0)
Monocytes Relative: 7 %
Neutro Abs: 8.4 10*3/uL — ABNORMAL HIGH (ref 1.7–7.7)
Neutrophils Relative %: 71 %
Platelets: 219 10*3/uL (ref 150–400)
RBC: 3.79 MIL/uL — ABNORMAL LOW (ref 3.87–5.11)
RDW: 14.8 % (ref 11.5–15.5)
WBC: 11.8 10*3/uL — ABNORMAL HIGH (ref 4.0–10.5)
nRBC: 0 % (ref 0.0–0.2)

## 2020-05-25 LAB — LACTIC ACID, PLASMA
Lactic Acid, Venous: 1.4 mmol/L (ref 0.5–1.9)
Lactic Acid, Venous: 0.9 mmol/L (ref 0.5–1.9)

## 2020-05-25 LAB — HIV ANTIBODY (ROUTINE TESTING W REFLEX): HIV Screen 4th Generation wRfx: NONREACTIVE

## 2020-05-25 LAB — TROPONIN I (HIGH SENSITIVITY)
Troponin I (High Sensitivity): 4 ng/L (ref <18)
Troponin I (High Sensitivity): 4 ng/L (ref <18)

## 2020-05-25 LAB — CREATININE, SERUM
Creatinine, Ser: 0.78 mg/dL (ref 0.44–1.00)
GFR, Estimated: >60 mL/min (ref >60)

## 2020-05-25 LAB — PROTIME-INR
INR: 0.9 (ref 0.8–1.2)
Prothrombin Time: 11.5 seconds (ref 11.4–15.2)

## 2020-05-25 LAB — RESP PANEL BY RT-PCR (FLU A&B, COVID) ARPGX2
Influenza A by PCR: NEGATIVE
Influenza B by PCR: NEGATIVE
SARS Coronavirus 2 by RT PCR: NEGATIVE

## 2020-05-25 MED ORDER — ALBUTEROL SULFATE (2.5 MG/3ML) 0.083% IN NEBU
2.5000 mg | INHALATION_SOLUTION | Freq: Four times a day (QID) | RESPIRATORY_TRACT | Status: DC
  Administered 2020-05-25 – 2020-05-30 (×19): 2.5 mg via RESPIRATORY_TRACT
  Filled 2020-05-25 (×20): qty 3

## 2020-05-25 MED ORDER — MORPHINE SULFATE (PF) 2 MG/ML IV SOLN
2.0000 mg | INTRAVENOUS | Status: DC | PRN
  Administered 2020-05-25 – 2020-05-27 (×6): 2 mg via INTRAVENOUS
  Filled 2020-05-25 (×6): qty 1

## 2020-05-25 MED ORDER — ONDANSETRON HCL 4 MG PO TABS: 4.0000 mg | ORAL_TABLET | Freq: Four times a day (QID) | ORAL | Status: DC | PRN

## 2020-05-25 MED ORDER — ONDANSETRON HCL 4 MG/2ML IJ SOLN: 4.0000 mg | Freq: Four times a day (QID) | INTRAMUSCULAR | Status: DC | PRN

## 2020-05-25 MED ORDER — FENTANYL CITRATE (PF) 100 MCG/2ML IJ SOLN
50.0000 ug | Freq: Once | INTRAMUSCULAR | Status: AC
  Administered 2020-05-25: 50 ug via INTRAVENOUS
  Filled 2020-05-25: qty 2

## 2020-05-25 MED ORDER — ONDANSETRON HCL 4 MG/2ML IJ SOLN
4.0000 mg | Freq: Four times a day (QID) | INTRAMUSCULAR | Status: DC | PRN
  Administered 2020-05-25 – 2020-05-26 (×2): 4 mg via INTRAVENOUS
  Filled 2020-05-25 (×2): qty 2

## 2020-05-25 MED ORDER — ONDANSETRON HCL 4 MG/2ML IJ SOLN
4.0000 mg | Freq: Once | INTRAMUSCULAR | Status: AC
  Administered 2020-05-25: 4 mg via INTRAVENOUS
  Filled 2020-05-25: qty 2

## 2020-05-25 MED ORDER — PROMETHAZINE HCL 25 MG/ML IJ SOLN
12.5000 mg | INTRAMUSCULAR | Status: DC | PRN
  Administered 2020-05-25 – 2020-05-30 (×6): 12.5 mg via INTRAVENOUS
  Filled 2020-05-25 (×8): qty 1

## 2020-05-25 MED ORDER — ACETAMINOPHEN 325 MG PO TABS
650.0000 mg | ORAL_TABLET | Freq: Four times a day (QID) | ORAL | Status: DC | PRN
  Administered 2020-05-26: 650 mg via ORAL
  Filled 2020-05-25: qty 2

## 2020-05-25 MED ORDER — METHYLPREDNISOLONE SODIUM SUCC 40 MG IJ SOLR
40.0000 mg | Freq: Four times a day (QID) | INTRAMUSCULAR | Status: AC
  Administered 2020-05-25 – 2020-05-28 (×12): 40 mg via INTRAVENOUS
  Filled 2020-05-25 (×12): qty 1

## 2020-05-25 MED ORDER — ACETAMINOPHEN 650 MG RE SUPP: 650.0000 mg | Freq: Four times a day (QID) | RECTAL | Status: DC | PRN

## 2020-05-25 MED ORDER — SODIUM CHLORIDE 0.9 % IV BOLUS
1000.0000 mL | Freq: Once | INTRAVENOUS | Status: AC
  Administered 2020-05-25: 1000 mL via INTRAVENOUS

## 2020-05-25 MED ORDER — ENOXAPARIN SODIUM 40 MG/0.4ML ~~LOC~~ SOLN
40.0000 mg | SUBCUTANEOUS | Status: DC
  Administered 2020-05-25 – 2020-05-30 (×6): 40 mg via SUBCUTANEOUS
  Filled 2020-05-25 (×6): qty 0.4

## 2020-05-25 MED ORDER — IOHEXOL 350 MG/ML SOLN
100.0000 mL | Freq: Once | INTRAVENOUS | Status: AC | PRN
  Administered 2020-05-25: 100 mL via INTRAVENOUS

## 2020-05-25 MED ORDER — DEXAMETHASONE SODIUM PHOSPHATE 10 MG/ML IJ SOLN
10.0000 mg | Freq: Once | INTRAMUSCULAR | Status: AC
  Administered 2020-05-25: 10 mg via INTRAVENOUS
  Filled 2020-05-25: qty 1

## 2020-05-25 NOTE — ED Provider Notes (Signed)
Care transferred from [place], [place] at shift change. See note for full HPI.  In summation, patient with history of hypertension, acute on chronic respiratory failure hypoxia, baseline 2-3 L, seizure disorder who presents with nausea hypertension hypoxia. Recent dc from [facility], admitted for hypoxia, likely aspiration pneumonia.  [place] home on steroids for likely pulmonary fibrosis.  Was not DC home on antibiotics.  She has chronic pain takes Dilaudid at home.  Reported increase shortness of breath which has not improved from her hospital discharge.  Has thick, white sputum.  Some lower abdominal pain.  According to PCP office patient was hypotensive as well as hypoxic on her home oxygen.  Was sent up to 5 L.  Does have a prior history of DVT  Plan on CTA, CT AP.  Baseline BP 324 systolic   Full Code per previous provider  Baseline pressure in 401 systolic  Patient was getting IV fluids upon care transferred.  She is pending CTA chest, abdomen pelvis.  Plan on likely admission for increased hypoxia on home oxygen, soft blood pressures.  Per DC note from patient facility spouse again charge however patient states she was never prescribed these or antibiotics at discharge    Physical Exam  BP 97/64   Pulse 64   Temp 98.1 F (36.7 C) (Oral)   Resp 12   Ht 5\' 2"  (1.575 m)   Wt 67.6 kg   SpO2 100%   BMI 27.25 kg/m   Physical Exam Vitals and nursing note reviewed.  Constitutional:      General: She is not in acute distress.    Appearance: She is well-developed and well-nourished. She is ill-appearing (Chronically ill appearing). She is not toxic-appearing.  HENT:     Head: Normocephalic and atraumatic.  Eyes:     Pupils: Pupils are equal, round, and reactive to light.  Cardiovascular:     Rate and Rhythm: Normal rate.     Pulses: Intact distal pulses.  Pulmonary:     Effort: Tachypnea present.     Comments: Coarse rhonchi throughout.  Clears throat frequently Chest:     Comments:  Equal rise and fall to chest Abdominal:     General: There is no distension.     Palpations: Abdomen is soft.  Musculoskeletal:        General: Normal range of motion.     Cervical back: Normal range of motion.     Right lower leg: No tenderness. No edema.     Left lower leg: No edema.  Skin:    General: Skin is warm and dry.     Capillary Refill: Capillary refill takes less than 2 seconds.  Neurological:     General: No focal deficit present.     Mental Status: She is alert and oriented to person, place, and time.  Psychiatric:        Mood and Affect: Mood and affect normal.     ED Course/Procedures   Clinical Course as of 05/25/20 2021  Wed May 25, 2020  1417 SARS Coronavirus 2 by RT PCR: NEGATIVE [JS]  1738 Troponin I (High Sensitivity) Normal [BH]  1738 Lactic acid, plasma 0.9 [BH]  1738 CBC with Differential(!) Mild leukocytosis, hemoglobin at baseline [BH]    Clinical Course User Index [BH] Henderly, Britni A, PA-C [JS] Soto, Johana, PA-C    .Critical Care Performed by: Nettie Elm, PA-C Authorized by: Nettie Elm, PA-C   Critical care provider statement:    Critical care time (minutes):  35   Critical care was necessary to treat or prevent imminent or life-threatening deterioration of the following conditions:  Respiratory failure   Critical care was time spent personally by me on the following activities:  Discussions with consultants, evaluation of patient's response to treatment, examination of patient, ordering and performing treatments and interventions, ordering and review of laboratory studies, ordering and review of radiographic studies, pulse oximetry, re-evaluation of patient's condition, obtaining history from patient or surrogate and review of old charts   Labs Reviewed  COMPREHENSIVE METABOLIC PANEL - Abnormal; Notable for the following components:      Result Value   Glucose, Bld 103 (*)    Total Protein 5.4 (*)    Albumin 2.9 (*)     All other components within normal limits  CBC WITH DIFFERENTIAL/PLATELET - Abnormal; Notable for the following components:   WBC 11.8 (*)    RBC 3.79 (*)    Hemoglobin 10.8 (*)    HCT 35.1 (*)    Neutro Abs 8.4 (*)    All other components within normal limits  I-STAT VENOUS BLOOD GAS, ED - Abnormal; Notable for the following components:   pH, Ven 7.483 (*)    pCO2, Ven 41.0 (*)    pO2, Ven 82.0 (*)    Bicarbonate 30.8 (*)    Acid-Base Excess 7.0 (*)    HCT 30.0 (*)    Hemoglobin 10.2 (*)    All other components within normal limits  RESP PANEL BY RT-PCR (FLU A&B, COVID) ARPGX2  CULTURE, BLOOD (ROUTINE X 2)  CULTURE, BLOOD (ROUTINE X 2)  LACTIC ACID, PLASMA  LACTIC ACID, PLASMA  PROTIME-INR  URINALYSIS, ROUTINE W REFLEX MICROSCOPIC  TROPONIN I (HIGH SENSITIVITY)  TROPONIN I (HIGH SENSITIVITY)  DG Chest 2 View  Result Date: 05/25/2020 CLINICAL DATA:  Shortness of breath EXAM: CHEST - 2 VIEW COMPARISON:  01/06/2019 FINDINGS: Chronic interstitial opacities. No definite new consolidation or edema. No pleural effusion. No pneumothorax. Cardiomediastinal contours are within normal limits. Residual contrast within the bowel. IVC filter is noted. Decreased osseous mineralization with age-indeterminate mid thoracic compression fractures. IMPRESSION: Interstitial prominence that may reflect chronic interstitial lung disease. No definite acute abnormality. Age-indeterminate mid thoracic compression fractures. Electronically Signed   By: Macy Mis M.D.   On: 05/25/2020 12:27   CT Angio Chest PE W and/or Wo Contrast  Result Date: 05/25/2020 CLINICAL DATA:  Chest pain or SOB, pleurisy or effusion suspected PE suspected, high prob EXAM: CT ANGIOGRAPHY CHEST WITH CONTRAST TECHNIQUE: Multidetector CT imaging of the chest was performed using the standard protocol during bolus administration of intravenous contrast. Multiplanar CT image reconstructions and MIPs were obtained to evaluate the  vascular anatomy. CONTRAST:  172mL OMNIPAQUE IOHEXOL 350 MG/ML SOLN COMPARISON:  Radiograph earlier today. Chest CT 09/14/2011 reviewed. FINDINGS: Cardiovascular: There are no filling defects within the pulmonary arteries to suggest pulmonary embolus. Dilated main pulmonary artery at 4.2 cm. Mild cardiomegaly. Coronary artery calcifications. Mild aortic tortuosity without dissection or aneurysm. Mild aortic atherosclerosis. Mediastinum/Nodes: Prominent mediastinal nodes including a 10 mm prevascular node, series 6, image 36. There is bilateral hilar adenopathy. Largest lymph node on the right measures 16 mm, largest node on the left measures 10 mm. Thyroid gland not well-defined. Tiny hiatal hernia. Lungs/Pleura: Interstitial lung disease with subpleural reticulation, areas of interspersed ground-glass and septal thickening. Mild bronchiectasis in the right middle lobe. No frank honeycombing. No recent CT to assess for progression or change. There is no pleural fluid. Imaging obtained in  expiration which limits assessment of the trachea and bronchi. No pneumothorax. Upper Abdomen: Assessed on concurrent abdominal CT, reported separately. Musculoskeletal: Exaggerated thoracic kyphosis. Compression fractures at T3, T5, and T7 which were assessed on thoracic spine CT last month, no interval change. L1 compression fracture with vertebral augmentation. No acute osseous abnormalities are seen. Review of the MIP images confirms the above findings. IMPRESSION: 1. No pulmonary embolus. 2. Dilated main pulmonary artery consistent with pulmonary arterial hypertension. 3. Interstitial lung disease with subpleural reticulation, areas of interspersed ground-glass and septal thickening. No frank honeycombing. Consider follow-up high-resolution chest CT on a nonemergent basis for further interstitial lung disease characterization. 4. Mediastinal and bilateral hilar adenopathy, likely reactive. 5. Coronary artery calcifications. 6.  Multiple thoracic compression fractures, unchanged from thoracic spine CT last month at [facility]. Aortic Atherosclerosis (ICD10-I70.0). Electronically Signed   By: Keith Rake M.D.   On: 05/25/2020 15:39   CT ABDOMEN PELVIS W CONTRAST  Result Date: 05/25/2020 CLINICAL DATA:  Abdominal distension.  Abdominal pain. EXAM: CT ABDOMEN AND PELVIS WITH CONTRAST TECHNIQUE: Multidetector CT imaging of the abdomen and pelvis was performed using the standard protocol following bolus administration of intravenous contrast. Delayed images are performed and associated with concurrent chest CT. CONTRAST:  128mL OMNIPAQUE IOHEXOL 350 MG/ML SOLN COMPARISON:  None. FINDINGS: Lower chest: Assessed on concurrent chest CT, reported separately. Hepatobiliary: No focal liver abnormality is seen. Status post cholecystectomy. No biliary dilatation. Pancreas: Diffuse fatty atrophy. No ductal dilatation or inflammation. Spleen: Normal in size. Scattered calcified granuloma. There are 2 low-density lesions, largest measuring 2.2 cm in the anterior mid aspect. Adrenals/Urinary Tract: No adrenal nodule. No hydronephrosis or perinephric edema. Homogeneous renal enhancement. Absent renal excretion on delayed phase imaging. No renal calculi. Urinary bladder is partially distended without wall thickening. Stomach/Bowel: Unremarkable stomach. No small bowel obstruction or inflammation. High-density material/contrast is seen throughout the colon. There is a large volume of colonic stool, primarily in the right colon. Cecum is located in the deep pelvis. Appendix is not confidently visualized, no evidence of appendicitis. Transverse, descending, and sigmoid colon are tortuous. There is no colonic wall thickening or pericolonic edema. No significant diverticular disease. Vascular/Lymphatic: IVC filter in place. Patent portal vein. Normal caliber abdominal aorta. No bulky abdominopelvic adenopathy. Reproductive: Status post hysterectomy. No  adnexal masses. Other: No ascites or free air. No intra-abdominal fluid collection. No body wall hernia. Musculoskeletal: L1 compression fracture with vertebral augmentation. Posterior fusion L4-S1. There are no acute or suspicious osseous abnormalities. IMPRESSION: 1. Large volume of colonic stool, primarily in the right colon, can be seen with constipation. No bowel obstruction or inflammation. 2. Absent renal excretion on delayed phase imaging, suggest renal dysfunction. 3. Two low-density splenic lesions, largest measuring 2.2 cm. In the absence of known malignancy, this is likely benign. Aortic Atherosclerosis (ICD10-I70.0). Electronically Signed   By: Keith Rake M.D.   On: 05/25/2020 15:32   MDM   Plan following up CT a chest, CT abdomen and pelvis.  Plan on admission for worsening hypoxia as well as hypotension.  Reviewed her labs and images:  CBC does have some mild leukocytosis at 11.8 Lactic acid 0.9 DG chest without acute abnormality CT abdomen pelvis with constipation CTA Chest no PE.  She has dilated pulmonary arteries consistent with pulmonary arterial hypertension.  She does have interstitial lung disease with subpleural reticulation.  CAD, chronic thoracic compression fracture  Patient reassessed. On 6L [place], moderate rhonchi on exam. Will need admission.  Blood pressures improved.  Now systolics are greater then 100, MAP greater than 65 over last 3 readings.  Did put in for steroids given exam.  States she cannot do prednisone however can do Decadron.  Unclear etiology of her worsening hypoxia.  Seems confusion with medications which she was was to be discharged from hospital from 5 days prior.  She does not appear septic.  She was fluid responsive, blood pressures improved with maps greater than 60 1:05 liter fluid bolus.  Will admit for further work-up.  CONSULT with Dr. Jonelle Sidle with [facility] will write patient for admission.  The patient appears reasonably stabilized for  admission considering the current resources, flow, and capabilities available in the ED at this time, and I doubt any other [facility] requiring further screening and/or treatment in the ED prior to admission.        Henderly, Britni A, PA-C 05/25/20 2022    Quintella Reichert, MD 05/26/20 0021

## 2020-05-25 NOTE — ED Provider Notes (Signed)
[facility] EMERGENCY DEPARTMENT Provider Note   CSN: [id number] Arrival date & time: 05/25/20  1124     History Chief Complaint  Patient presents with  . Shortness of Breath    [name] Fitzpatrick is a 65 y.o. female.  65 y.o female with a PMH of acute on chronic respiratory failure with hypoxia, ARDS, HTN, seizure disorder presents to the ED with a chief complaint of hypotension, hypoxia.  Patient was evaluated PCPs office today for a post discharge from the hospital visit, she was found to be hypoxic along with hypotensive and sent to the ED for further evaluation.  Patient reports she has had shortness of breath which has not improved since her hospital admission.  She reports attempting to use double breathing treatments without much improvement.  She also endorses a thick white stringy sputum with her cough.  Patient also states that she coughs so hard that she noted something pop in her abdomen, there is a hematoma and bruising noted to the lower aspect of her abdomen.  She also has diffuse tenderness, states that her abdomen has been bloated since she was in the hospital and she attempted Gas-X without much improvement in her symptoms.  She is have a history of bronchial stasis with COPD, does wear 3 L of O2 nasal cannula at baseline, she was bumped up to 5 L by PCP and office due to oxygen saturations in the upper 80s.  Reports an episode last night, when she ambulated to the bathroom her oxygen saturation dropped to 50%.  She has not had any fever, does endorse chest pain with her cough,no recent Covid 19 immunizations. She does have a prior hx of DVT, currently not on any blood thinners.   The history is provided by the patient.  Shortness of Breath Severity:  Moderate Onset quality:  Gradual Timing:  Constant Progression:  Worsening Chronicity:  Recurrent Context: activity   Relieved by:  Nothing Worsened by:  Deep breathing and movement Associated symptoms:  abdominal pain and chest pain   Associated symptoms: no fever and no vomiting   Risk factors: hx of PE/DVT        Past Medical History:  Diagnosis Date  . Acute deep vein thrombosis (DVT) of left femoral vein ([place])   . Acute on chronic respiratory failure with hypoxia ([place])   . Anxiety   . ARDS (adult respiratory distress syndrome) ([place])   . Chronic LBP   . Chronic neck pain   . COPD (chronic obstructive pulmonary disease) ([place])   . Depression   . DM type 2 (diabetes mellitus, type 2) ([place])   . Fibromyalgia   . GERD (gastroesophageal reflux disease)   . H/O suicide attempt   . Hashimoto's thyroiditis   . Hypertension   . Hypothyroidism   . IBS (irritable bowel syndrome)    with constipation  . Interstitial pneumonia ([place])   . Migraine headache   . Osteoarthritis   . Seizure disorder ([place])   . Thyroiditis, 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   had total thyroidectomy 11/30/2013 at [facility]    Patient Active Problem List   Diagnosis Date Noted  . Acute on chronic respiratory failure with hypoxia ([place])   . Seizure disorder ([place])   . Acute deep vein thrombosis (DVT) of left femoral vein ([place])   . Interstitial pneumonia ([place])   . ARDS (adult respiratory distress syndrome) ([place])   . Chest pain 09/09/2015  . Cellulitis of leg, left 09/09/2015  . Glossitis 09/09/2015  .  Anxiety state 09/09/2015  . Headache, acute 09/08/2015  . Bilateral hand pain 06/07/2015  . Nausea without vomiting 06/06/2015  . 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  . Knee pain, left 04/03/2015  . Shoulder pain, right 04/03/2015  . Urinary incontinence 04/03/2015  . Multiple lipomas 11/08/2013  . Compression fracture of L1 lumbar vertebra ([place]) 11/08/2013  . Dyspnea 08/14/2012  . Obesity (BMI 30-39.9) 03/23/2012  . IGT (impaired glucose tolerance) 03/23/2012  . Vaginal dryness, menopausal 08/28/2011  . 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 Post traumatic stress disorder (PTSD) 07/02/2011    Class:  Chronic  . COPD (chronic obstructive pulmonary disease) ([place]) 10/21/2010  . Pulmonary nodule 10/21/2010  . Headache 10/20/2010  . MVP (mitral valve prolapse) 08/23/2010  . IBS 10/12/2009  . Post-operative hypothyroidism 05/17/2006  . ALLERGIC RHINITIS 05/17/2006  . HOT FLASHES 05/17/2006  . Osteoarthritis 05/17/2006    Past Surgical History:  Procedure Laterality Date  . ABDOMINAL HYSTERECTOMY    . b/l foot surgery --bunions    . BIOPSY  07/29/2015   Procedure: BIOPSY;  Surgeon: Rogene Houston, MD;  Location: [facility];  Service: Endoscopy;;  Duodenal,  gastric, and esophageal  biopsies  . C-Spine fusion  1991, 2004  . CHOLECYSTECTOMY    . COLONOSCOPY WITH PROPOFOL N/A 07/29/2015   Procedure: COLONOSCOPY WITH PROPOFOL;  Surgeon: Rogene Houston, MD;  Location: [facility];  Service: Endoscopy;  Laterality: N/A;  8:30  . ESOPHAGEAL DILATION N/A 07/29/2015   Procedure: ESOPHAGEAL DILATION;  Surgeon: Rogene Houston, MD;  Location: [facility];  Service: Endoscopy;  Laterality: N/A;  . ESOPHAGOGASTRODUODENOSCOPY (EGD) WITH PROPOFOL N/A 07/29/2015   Procedure: ESOPHAGOGASTRODUODENOSCOPY (EGD) WITH PROPOFOL;  Surgeon: Rogene Houston, MD;  Location: [facility];  Service: Endoscopy;  Laterality: N/A;  . l carpal tunnel release Right   . l spine fusion x 2    . reconstucted right ankle    . Right knee arthroscopic surgery    . total reconstruction of right ankle and heel     total of three   . VESICOVAGINAL FISTULA CLOSURE W/ TAH  1993   Fibroids no Ca     OB History   No obstetric history on file.     Family History  Problem Relation Age of Onset  . Cancer Father        pancreatic   . Pancreatic cancer Father   . Heart failure Mother   . GI problems Mother   . Heart disease Mother   . Diabetes Mother   . Thyroid disease Sister   . Cancer Maternal Grandfather   . Cancer Other        family history     Social History   Tobacco Use  . Smoking status: Former  Smoker    Packs/day: 0.30    Years: 40.00    Pack years: 12.00    Types: Cigarettes    Quit date: 07/24/2012    Years since quitting: 7.8  . Tobacco comment: quit 2 months ago   Substance Use Topics  . Alcohol use: No  . Drug use: Yes    Comment: 20 yrs ago, OD on prescription drugs    Home Medications Prior to Admission medications   Medication Sig Start Date End Date Taking? Authorizing Provider  aspirin EC 81 MG tablet Take 81 mg by mouth daily.    [provider]  diazepam (VALIUM) 5 MG tablet Take 1 tablet by mouth twice daily as  needed for anxiety. 03/23/15   [provider]  dicyclomine (BENTYL) 10 MG capsule TAKE 1 CAPSULE BY MOUTH TWICE DAILY. 09/03/14   Fayrene Helper, MD  doxycycline (VIBRA-TABS) 100 MG tablet Take 1 tablet (100 mg total) by mouth 2 (two) times daily. 11/18/15   Fayrene Helper, MD  ESTRACE VAGINAL 0.1 MG/GM vaginal cream INSERT 1 GRAM VAGINALLY ONCE DAILY AS DIRECTED 07/01/15   [provider]  fluconazole (DIFLUCAN) 100 MG tablet Take 1 tablet (100 mg total) by mouth daily. 09/08/15   Fayrene Helper, MD  fluticasone [facility]) 50 MCG/ACT nasal spray PLACE 2 SPRAYS INTO BOTH NOSTRILS DAILY 04/11/16   Fayrene Helper, MD  gabapentin (NEURONTIN) 300 MG capsule Take 2 capsules by mouth every 8 hours. 03/23/15   [provider]  HYDROmorphone (DILAUDID) 4 MG tablet Take 1 tablet by mouth every 4 hours as needed for breakthrough pain. 03/23/15   [provider]  ibuprofen (ADVIL,MOTRIN) 600 MG tablet One tablet 3 times daily for 1 week Patient not taking: Reported on 07/25/2015 11/11/14   Fayrene Helper, MD  levothyroxine (SYNTHROID, LEVOTHROID) 125 MCG tablet TAKE 2 TABLETS(224 MCG) BY MOUTH DAILY BEFORE BREAKFAST Patient taking differently: Take 250 mcg by mouth daily before breakfast.  06/23/15   Fayrene Helper, MD  lidocaine (LIDODERM) 5 % Place 1 patch onto the skin daily.  10/21/12   [provider]  magic mouthwash SOLN Take 5 mLs by mouth 4 (four) times daily as needed for mouth pain. Patient not taking: Reported on 07/25/2015 06/10/15   Fayrene Helper, MD  meclizine (ANTIVERT) 12.5 MG tablet Take 1 tablet (12.5 mg total) by mouth 3 (three) times daily as needed for dizziness. 04/21/14   Fayrene Helper, MD  methadone (DOLOPHINE) 10 MG tablet Take 1.5 tablets (15 mg total) by mouth 3 (three) times daily. Patient taking differently: Take 10-15 mg by mouth 3 (three) times daily. Takes 1-1.5 tablets depending on her pain level. 11/16/13   Ghimire, Henreitta Leber, MD  omeprazole (PRILOSEC) 40 MG capsule TAKE 1 CAPSULE(40 MG) BY MOUTH DAILY 01/20/16   Fayrene Helper, MD  polyethylene glycol [facility] / GLYCOLAX) packet Take 17 g by mouth daily.    [provider]  polyethylene glycol powder (GLYCOLAX/MIRALAX) powder MIX 1 CAPFUL(17 GM) IN WATER TWICE DAILY 01/20/16   Fayrene Helper, MD  polyethylene glycol-electrolytes (NULYTELY/GOLYTELY) 420 g solution Take 4,000 mLs by mouth once. 06/22/15   Setzer, Rona Ravens, NP  promethazine (PHENERGAN) 25 MG tablet Take 1 tablet (25 mg total) by mouth daily as needed for nausea or vomiting. 07/07/15   Fayrene Helper, MD  promethazine-dextromethorphan (PROMETHAZINE-DM) 6.25-15 MG/5ML syrup Take 5 mLs by mouth at bedtime as needed for cough. Patient not taking: Reported on 07/25/2015 06/10/15   Fayrene Helper, MD  sertraline (ZOLOFT) 100 MG tablet Take 100 mg by mouth at bedtime.    [provider]  sucralfate (CARAFATE) 1 GM/10ML suspension Take 10 mLs (1 g total) by mouth 4 (four) times daily -  with meals and at bedtime. 07/29/15   Rogene Houston, MD  traZODone (DESYREL) 100 MG tablet Take 100-200 mg by mouth at bedtime as needed for sleep.    [provider]    Allergies    Prednisone, Penicillins, Cephalexin, Latex, and Metoclopramide hcl  Review of Systems   Review of Systems  Constitutional: Negative for chills  and fever.  HENT: Negative for rhinorrhea and sinus  pressure.   Respiratory: Positive for shortness of breath.   Cardiovascular: Positive for chest pain.  Gastrointestinal: Positive for abdominal pain and nausea. Negative for blood in stool and vomiting.  Genitourinary: Negative for flank pain.  Musculoskeletal: Positive for back pain.  Skin: Negative for pallor and wound.  Neurological: Positive for dizziness and light-headedness.  All other systems reviewed and are negative.   Physical Exam Updated Vital Signs BP (!) 89/46   Pulse 70   Temp 98.1 F (36.7 C) (Oral)   Resp (!) 24   Ht 5\' 2"  (1.575 m)   Wt 67.6 kg   SpO2 96%   BMI 27.25 kg/m   Physical Exam Vitals and nursing note reviewed.  Constitutional:      Appearance: She is ill-appearing.  HENT:     Head: Normocephalic and atraumatic.  Eyes:     Pupils: Pupils are equal, round, and reactive to light.  Cardiovascular:     Rate and Rhythm: Normal rate.  Pulmonary:     Effort: No tachypnea.     Breath sounds: Decreased breath sounds and rhonchi present. No wheezing.  Chest:     Chest wall: No tenderness.  Abdominal:     General: Abdomen is flat. Bowel sounds are decreased.     Palpations: Abdomen is soft.     Tenderness: There is abdominal tenderness in the suprapubic area.       Comments: Bruising to the suprapubic area, TTP.   Musculoskeletal:     Right lower leg: No tenderness. No edema.     Left lower leg: No tenderness. No edema.  Skin:    General: Skin is warm and dry.     Nails: There is clubbing.  Neurological:     Mental Status: She is alert and oriented to person, place, and time.     ED Results / Procedures / Treatments   Labs (all labs ordered are listed, but only abnormal results are displayed) Labs Reviewed  COMPREHENSIVE METABOLIC PANEL - Abnormal; Notable for the following components:      Result Value   Glucose, Bld 103 (*)    Total Protein 5.4 (*)    Albumin 2.9 (*)    All other  components within normal limits  CBC WITH DIFFERENTIAL/PLATELET - Abnormal; Notable for the following components:   WBC 11.8 (*)    RBC 3.79 (*)    Hemoglobin 10.8 (*)    HCT 35.1 (*)    Neutro Abs 8.4 (*)    All other components within normal limits  I-STAT VENOUS BLOOD GAS, ED - Abnormal; Notable for the following components:   pH, Ven 7.483 (*)    pCO2, Ven 41.0 (*)    pO2, Ven 82.0 (*)    Bicarbonate 30.8 (*)    Acid-Base Excess 7.0 (*)    HCT 30.0 (*)    Hemoglobin 10.2 (*)    All other components within normal limits  RESP PANEL BY RT-PCR (FLU A&B, COVID) ARPGX2  CULTURE, BLOOD (ROUTINE X 2)  CULTURE, BLOOD (ROUTINE X 2)  LACTIC ACID, PLASMA  LACTIC ACID, PLASMA  PROTIME-INR  URINALYSIS, ROUTINE W REFLEX MICROSCOPIC    EKG EKG Interpretation  Date/Time:  Wednesday May 25 2020 12:37:17 EST Ventricular Rate:  69 PR Interval:    QRS Duration: 103 QT Interval:  407 QTC Calculation: 433 R Axis:   81 Text Interpretation: Sinus rhythm Probable right ventricular hypertrophy Probable lateral infarct, old Confirmed by Madalyn Rob [phone]) on 05/25/2020 2:22:08 PM  Radiology DG Chest 2 View  Result Date: 05/25/2020 CLINICAL DATA:  Shortness of breath EXAM: CHEST - 2 VIEW COMPARISON:  01/06/2019 FINDINGS: Chronic interstitial opacities. No definite new consolidation or edema. No pleural effusion. No pneumothorax. Cardiomediastinal contours are within normal limits. Residual contrast within the bowel. IVC filter is noted. Decreased osseous mineralization with age-indeterminate mid thoracic compression fractures. IMPRESSION: Interstitial prominence that may reflect chronic interstitial lung disease. No definite acute abnormality. Age-indeterminate mid thoracic compression fractures. Electronically Signed   By: Macy Mis M.D.   On: 05/25/2020 12:27    Procedures Procedures   Medications Ordered in ED Medications  sodium chloride 0.9 % bolus 1,000 mL (1,000 mLs  Intravenous New Bag/Given 05/25/20 1429)  ondansetron (ZOFRAN) injection 4 mg (4 mg Intravenous Given 05/25/20 1429)  fentaNYL (SUBLIMAZE) injection 50 mcg (50 mcg Intravenous Given 05/25/20 1429)    ED Course  I have reviewed the triage vital signs and the nursing notes.  Pertinent labs & imaging results that were available during my care of the patient were reviewed by me and considered in my medical decision making (see chart for details).  Clinical Course as of 05/25/20 1507  Wed May 25, 2020  1417 SARS Coronavirus 2 by RT PCR: NEGATIVE [JS]    Clinical Course User Index [JS] Janeece Fitting, PA-C   MDM Rules/Calculators/A&P   Chronically ill-appearing patient with history of CAD stasis presents to the ED with a chief complaint of shortness of breath.  Patient was evaluated by PCP this morning, found to be hypoxic and hypotensive while in office.  She reports since her discharge from the hospital approximately 5 days ago, she has began to worsen.  Stating that her shortness of breath has been exacerbated with movement and walking, she is currently on 3 L nasal cannula but reports increase in oxygen requirement to 5 L by primary care physician.  She does have a prior history of blood clots, currently not on any blood thinners at this time.  She also endorses coughing so hard that she felt a "pop on her abdomen, bruising noted to the area along with pain with palpation.  She is having regular bowel movements without any blood in her stool.  During evaluation patient is conically ill-appearing, lungs are diminished to auscultation, blood pressure is remarkable for hypotension with a blood pressure with a systolic in the 99J over 57S, provided with a liter bolus.  X-ray of her chest showed: Interstitial prominence that may reflect chronic interstitial lung  disease. No definite acute abnormality.    Age-indeterminate mid thoracic compression fractures.     Interpretation of her labs revealed a  CBC with a slight leukocytosis, her hemoglobin is stable.  CMP without any electrolyte normality.  LFTs are unremarkable, creatinine levels within normal limits.  Rapid COVID was obtained, negative for SARS at this time.  VBG showed elevation in bicarb and decrease in CO2, however patient is maintaining her O2 saturations at this time on 5 L via nasal cannula.  Patient's pressure needs to be soft with a systolic in the 17B, bolus currently being given.  She was given Zofran along with fentanyl for pain control.  She is alert and oriented x4 despite low blood pressure.  I have extensively review her records, see prior blood pressures recorded in office with a systolic in the low 939Q at her baseline.  She is currently pending a CT chest to further evaluate and rule out pulmonary embolism.  Along with a CT abdomen  in order to rule out any incarcerated hernia.  Of note, patient is seen by pain clinic, cording to her chart she is on MS Contin, fentanyl has been for a while.  This medications were tweaked recently.  She was previously placed on a morphine PCA pump.  She is followed by pulmonology, Dr. Raenette Rover, last seen in office on April 29, 2020, and her office visit oxygen saturation was noted at 94% on 3 L.  It looks like a CT chest high-resolution was ordered, I do not see any images or results from this exam.  Patient care signed out to [place] PA at shift change pending imaging and further management.   Portions of this note were generated with [employer]. Dictation errors may occur despite best attempts at proofreading.  Final Clinical Impression(s) / ED Diagnoses Final diagnoses:  SOB (shortness of breath)  Hypoxia    Rx / DC Orders ED Discharge Orders    None       Janeece Fitting, PA-C 05/25/20 1507    Lucrezia Starch, MD 05/27/20 1844

## 2020-05-25 NOTE — ED Triage Notes (Signed)
Patient to sent to ED via [place] from PCP for evaluation of continued shortness of breath, productive cough, and abdominal pain during a follow up visit after a recent hospital admission. Patient wears 3L O2 [place], PCP reports O2 sat 88% on 3L so increased to 5L, 98% on 5L. 22g in left wrist, 250 mL NS.Patient alert, oriented, and in no apparent distress at this time.

## 2020-05-25 NOTE — H&P (Signed)
History and Physical   [name] Fitzpatrick IDP:[id number] DOB: 1955/06/21 DOA: 05/25/2020  Referring MD/NP/PA: Dr. Roslynn Amble  PCP: Lilian Coma., MD   Outpatient Specialists: [facility] chest group pulmonary  Patient coming from: Home  Chief Complaint: Shortness of breath  HPI: [name] Fitzpatrick is a 65 y.o. female with medical history significant of pulmonary fibrosis, seizure disorder chronic respiratory failure, history of ARDS, chronic obstructive lung disease, fibromyalgia, GERD, hypothyroidism, essential hypertension, history of thyroiditis and diabetes who was just discharged on March 4 from [facility] where she was admitted with acute on chronic respiratory failure with pulmonary fibrosis and community-acquired pneumonia.  Patient completed treatment and was discharged home.  She was not on any antibiotics.  Patient returned to the ER today with worsening shortness of breath cough and wheezing.  Her baseline oxygen was 2 to 3 L.  Patient is now requiring up to 6 L.  Patient also complains some nausea.  She is also having cough productive of white sputum.  Some abdominal pain.  Patient has been on steroids but not antibiotics after recent discharge.  Can walk a few hours down CT angiogram did not show active pneumonia however suggestive of possible pulmonary fibrosis.  She is therefore being admitted to the hospital for further evaluation and treatment.  She was initially suspected to have had aspiration pneumonia due to her nausea and some vomiting..  ED Course: Temperature is 99 blood pressure 80/58 initially pulse 76 respiratory 24 oxygen sat 96% on 6 L.  Urinalysis essentially negative.  Troponin is negative.  pH of 7.483 venous blood.  Hemoglobin 10.2.  Chemistries largely within normal except glucose 103.  Lactic acid 1.4.  White count is 11.8.  Chest x-ray showed interstitial prominence probably chronic chronic interstitial lung disease.  Also multiple  midthoracic compression fractures probably old.  CT abdomen pelvis showed large volume colonic stool mainly in the right probably constipation.  The low-density splenic lesions probably benign.  CT angiogram of the chest showed no PE.  Evidence of pulmonary arterial hypertension.  Also interstitial lung disease as well as mediastinal and bilateral hilar adenopathy.  Also multiple thoracic compression fractures which is called.  Patient being admitted with worsening acute respiratory failure.  No evidence of pneumonia  Review of Systems: As per HPI otherwise 10 point review of systems negative.    Past Medical History:  Diagnosis Date  . Acute deep vein thrombosis (DVT) of left femoral vein ([place])   . Acute on chronic respiratory failure with hypoxia ([place])   . Anxiety   . ARDS (adult respiratory distress syndrome) ([place])   . Chronic LBP   . Chronic neck pain   . COPD (chronic obstructive pulmonary disease) ([place])   . Depression   . DM type 2 (diabetes mellitus, type 2) ([place])   . Fibromyalgia   . GERD (gastroesophageal reflux disease)   . H/O suicide attempt   . Hashimoto's thyroiditis   . Hypertension   . Hypothyroidism   . IBS (irritable bowel syndrome)    with constipation  . Interstitial pneumonia ([place])   . Migraine headache   . Osteoarthritis   . Seizure disorder ([place])   . Thyroiditis, 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   had total thyroidectomy 11/30/2013 at [facility]    Past Surgical History:  Procedure Laterality Date  . ABDOMINAL HYSTERECTOMY    . b/l foot surgery --bunions    . BIOPSY  07/29/2015   Procedure: BIOPSY;  Surgeon: Rogene Houston, MD;  Location: [facility];  Service: Endoscopy;;  Duodenal,  gastric, and esophageal  biopsies  . C-Spine fusion  1991, 2004  . CHOLECYSTECTOMY    . COLONOSCOPY WITH PROPOFOL N/A 07/29/2015   Procedure: COLONOSCOPY WITH PROPOFOL;  Surgeon: Rogene Houston, MD;  Location: [facility];  Service: Endoscopy;  Laterality: N/A;  8:30  . ESOPHAGEAL  DILATION N/A 07/29/2015   Procedure: ESOPHAGEAL DILATION;  Surgeon: Rogene Houston, MD;  Location: [facility];  Service: Endoscopy;  Laterality: N/A;  . ESOPHAGOGASTRODUODENOSCOPY (EGD) WITH PROPOFOL N/A 07/29/2015   Procedure: ESOPHAGOGASTRODUODENOSCOPY (EGD) WITH PROPOFOL;  Surgeon: Rogene Houston, MD;  Location[facility];  Service: Endoscopy;  Laterality: N/A;  . l carpal tunnel release Right   . l spine fusion x 2    . reconstucted right ankle    . Right knee arthroscopic surgery    . total reconstruction of right ankle and heel     total of three   . VESICOVAGINAL FISTULA CLOSURE W/ TAH  1993   Fibroids no Ca     reports that she quit smoking about 7 years ago. Her smoking use included cigarettes. She has a 12.00 pack-year smoking history. She does not have any smokeless tobacco history on file. She reports current drug use. She reports that she does not drink alcohol.  Allergies  Allergen Reactions  . Prednisone Shortness Of Breath and Nausea And Vomiting  . Penicillins Other (See Comments)    Stroke-like symptoms Has patient had a PCN reaction causing immediate rash, facial/tongue/throat swelling, SOB or lightheadedness with hypotension: YES Has patient had a PCN reaction causing severe rash involving mucus membranes or skin necrosis: No Has patient had a PCN reaction that required hospitalization: No Has patient had a PCN reaction occurring within the last 10 years: Yes If all of the above answers are "NO", then may proceed with Cephalosporin use.   . Cephalexin Hives, Swelling and Rash  . Latex Rash and Other (See Comments)    Causes skin to tear easily  . Metoclopramide Hcl Hives and Rash    Family History  Problem Relation Age of Onset  . Cancer Father        pancreatic   . Pancreatic cancer Father   . Heart failure Mother   . GI problems Mother   . Heart disease Mother   . Diabetes Mother   . Thyroid disease Sister   . Cancer Maternal Grandfather   .  Cancer Other        family history      Prior to Admission medications   Medication Sig Start Date End Date Taking? Authorizing Provider  aspirin EC 81 MG tablet Take 81 mg by mouth daily.    [provider]  diazepam (VALIUM) 5 MG tablet Take 1 tablet by mouth twice daily as needed for anxiety. 03/23/15   [provider]  dicyclomine (BENTYL) 10 MG capsule TAKE 1 CAPSULE BY MOUTH TWICE DAILY. 09/03/14   Fayrene Helper, MD  doxycycline (VIBRA-TABS) 100 MG tablet Take 1 tablet (100 mg total) by mouth 2 (two) times daily. 11/18/15   Fayrene Helper, MD  ESTRACE VAGINAL 0.1 MG/GM vaginal cream INSERT 1 GRAM VAGINALLY ONCE DAILY AS DIRECTED 07/01/15   [provider]  fluconazole (DIFLUCAN) 100 MG tablet Take 1 tablet (100 mg total) by mouth daily. 09/08/15   Fayrene Helper, MD  fluticasone (FLONASE) 50 MCG/ACT nasal spray PLACE 2 SPRAYS INTO BOTH NOSTRILS DAILY  04/11/16   Fayrene Helper, MD  gabapentin (NEURONTIN) 300 MG capsule Take 2 capsules by mouth every 8 hours. 03/23/15   [provider]  HYDROmorphone (DILAUDID) 4 MG tablet Take 1 tablet by mouth every 4 hours as needed for breakthrough pain. 03/23/15   [provider]  ibuprofen (ADVIL,MOTRIN) 600 MG tablet One tablet 3 times daily for 1 week Patient not taking: Reported on 07/25/2015 11/11/14   Fayrene Helper, MD  levothyroxine (SYNTHROID, LEVOTHROID) 125 MCG tablet TAKE 2 TABLETS(224 MCG) BY MOUTH DAILY BEFORE BREAKFAST Patient taking differently: Take 250 mcg by mouth daily before breakfast.  06/23/15   Fayrene Helper, MD  lidocaine (LIDODERM) 5 % Place 1 patch onto the skin daily.  10/21/12   [provider]  magic mouthwash SOLN Take 5 mLs by mouth 4 (four) times daily as needed for mouth pain. Patient not taking: Reported on 07/25/2015 06/10/15   Fayrene Helper, MD  meclizine (ANTIVERT) 12.5 MG tablet Take 1 tablet (12.5 mg total) by mouth 3 (three) times daily as  needed for dizziness. 04/21/14   Fayrene Helper, MD  methadone (DOLOPHINE) 10 MG tablet Take 1.5 tablets (15 mg total) by mouth 3 (three) times daily. Patient taking differently: Take 10-15 mg by mouth 3 (three) times daily. Takes 1-1.5 tablets depending on her pain level. 11/16/13   Ghimire, Henreitta Leber, MD  omeprazole (PRILOSEC) 40 MG capsule TAKE 1 CAPSULE(40 MG) BY MOUTH DAILY 01/20/16   Fayrene Helper, MD  polyethylene glycol [facility] / GLYCOLAX) packet Take 17 g by mouth daily.    [provider]  polyethylene glycol powder (GLYCOLAX/MIRALAX) powder MIX 1 CAPFUL(17 GM) IN WATER TWICE DAILY 01/20/16   Fayrene Helper, MD  polyethylene glycol-electrolytes (NULYTELY/GOLYTELY) 420 g solution Take 4,000 mLs by mouth once. 06/22/15   Setzer, Rona Ravens, NP  promethazine (PHENERGAN) 25 MG tablet Take 1 tablet (25 mg total) by mouth daily as needed for nausea or vomiting. 07/07/15   Fayrene Helper, MD  promethazine-dextromethorphan (PROMETHAZINE-DM) 6.25-15 MG/5ML syrup Take 5 mLs by mouth at bedtime as needed for cough. Patient not taking: Reported on 07/25/2015 06/10/15   Fayrene Helper, MD  sertraline (ZOLOFT) 100 MG tablet Take 100 mg by mouth at bedtime.    [provider]  sucralfate (CARAFATE) 1 GM/10ML suspension Take 10 mLs (1 g total) by mouth 4 (four) times daily -  with meals and at bedtime. 07/29/15   Rogene Houston, MD  traZODone (DESYREL) 100 MG tablet Take 100-200 mg by mouth at bedtime as needed for sleep.    [provider]    Physical Exam: Vitals:   05/25/20 2015 05/25/20 2045 05/25/20 2100 05/25/20 2145  BP: 102/70 98/71 108/63 102/64  Pulse: 73 72 76 73  Resp: 17 16 17 16   Temp:      TempSrc:      SpO2: 100% 100% 100% 95%  Weight:      Height:          Constitutional: NAD, calm, comfortable Vitals:   05/25/20 2015 05/25/20 2045 05/25/20 2100 05/25/20 2145  BP: 102/70 98/71 108/63 102/64  Pulse: 73 72 76 73  Resp: 17 16 17 16    Temp:      TempSrc:      SpO2: 100% 100% 100% 95%  Weight:      Height:       Eyes: PERRL, lids and conjunctivae normal ENMT: Mucous membranes are moist. Posterior pharynx clear  of any exudate or lesions.Normal dentition.  Neck: normal, supple, no masses, no thyromegaly Respiratory: clear to auscultation bilaterally, no wheezing, no crackles. Normal respiratory effort. No accessory muscle use.  Cardiovascular: Regular rate and rhythm, no murmurs / rubs / gallops. No extremity edema. 2+ pedal pulses. No carotid bruits.  Abdomen: no tenderness, no masses palpated. No hepatosplenomegaly. Bowel sounds positive.  Musculoskeletal: no clubbing / cyanosis. No joint deformity upper and lower extremities. Good ROM, no contractures. Normal muscle tone.  Skin: no rashes, lesions, ulcers. No induration Neurologic: CN 2-12 grossly intact. Sensation intact, DTR normal. Strength 5/5 in all 4.  Psychiatric: Normal judgment and insight. Alert and oriented x 3. Normal mood.     Labs on Admission: I have personally reviewed following labs and imaging studies  CBC: Recent Labs  Lab 05/25/20 1129 05/25/20 1315  WBC 11.8*  --   NEUTROABS 8.4*  --   HGB 10.8* 10.2*  HCT 35.1* 30.0*  MCV 92.6  --   PLT 219  --    Basic Metabolic Panel: Recent Labs  Lab 05/25/20 1129 05/25/20 1315  NA 137 135  K 3.9 4.1  CL 100  --   CO2 28  --   GLUCOSE 103*  --   BUN 17  --   CREATININE 0.83  --   CALCIUM 9.4  --    GFR: Estimated Creatinine Clearance: 61.7 mL/min (by C-G formula based on SCr of 0.83 mg/dL). Liver Function Tests: Recent Labs  Lab 05/25/20 1129  AST 17  ALT 16  ALKPHOS 41  BILITOT 0.4  PROT 5.4*  ALBUMIN 2.9*   No results for input(s): LIPASE, AMYLASE in the last 168 hours. No results for input(s): AMMONIA in the last 168 hours. Coagulation Profile: Recent Labs  Lab 05/25/20 1222  INR 0.9   Cardiac Enzymes: No results for input(s): CKTOTAL, CKMB, CKMBINDEX, TROPONINI in  the last 168 hours. BNP (last 3 results) No results for input(s): PROBNP in the last 8760 hours. HbA1C: No results for input(s): HGBA1C in the last 72 hours. CBG: No results for input(s): GLUCAP in the last 168 hours. Lipid Profile: No results for input(s): CHOL, HDL, LDLCALC, TRIG, CHOLHDL, LDLDIRECT in the last 72 hours. Thyroid Function Tests: No results for input(s): TSH, T4TOTAL, FREET4, T3FREE, THYROIDAB in the last 72 hours. Anemia Panel: No results for input(s): VITAMINB12, FOLATE, FERRITIN, TIBC, IRON, RETICCTPCT in the last 72 hours. Urine analysis:    Component Value Date/Time   COLORURINE STRAW (A) 05/25/2020 2007   APPEARANCEUR CLEAR 05/25/2020 2007   LABSPEC 1.031 (H) 05/25/2020 2007   PHURINE 7.0 05/25/2020 2007   GLUCOSEU NEGATIVE 05/25/2020 2007   HGBUR NEGATIVE 05/25/2020 2007   HGBUR negative 05/17/2006 0000   BILIRUBINUR NEGATIVE 05/25/2020 2007   BILIRUBINUR small 03/30/2015 [place] 05/25/2020 2007   PROTEINUR NEGATIVE 05/25/2020 2007   UROBILINOGEN 0.2 03/30/2015 1546   UROBILINOGEN 0.2 11/08/2013 0237   NITRITE NEGATIVE 05/25/2020 2007   LEUKOCYTESUR TRACE (A) 05/25/2020 2007   Sepsis Labs: @LABRCNTIP (procalcitonin:4,lacticidven:4) ) Recent Results (from the past 240 hour(s))  Resp Panel by RT-PCR (Flu A&B, Covid) Nasopharyngeal Swab     Status: None   Collection Time: 05/25/20  1:05 PM   Specimen: Nasopharyngeal Swab; Nasopharyngeal(NP) swabs in vial transport medium  Result Value Ref Range Status   SARS Coronavirus 2 by RT PCR NEGATIVE NEGATIVE Final    Comment: (NOTE) SARS-CoV-2 target nucleic acids are NOT DETECTED.  The SARS-CoV-2 RNA is generally  detectable in upper respiratory specimens during the acute phase of infection. The lowest concentration of SARS-CoV-2 viral copies this assay can detect is 138 copies/mL. A negative result does not preclude SARS-Cov-2 infection and should not be used as the sole basis for  treatment or other patient management decisions. A negative result may occur with  improper specimen collection/handling, submission of specimen other than nasopharyngeal swab, presence of viral mutation(s) within the areas targeted by this assay, and inadequate number of viral copies(<138 copies/mL). A negative result must be combined with clinical observations, patient history, and epidemiological information. The expected result is Negative.  Fact Sheet for Patients:  EntrepreneurPulse.com.au  Fact Sheet for Healthcare Providers:  IncredibleEmployment.be  This test is no t yet approved or cleared by the [place] FDA and  has been authorized for detection and/or diagnosis of SARS-CoV-2 by FDA under an Emergency Use Authorization (EUA). This EUA will remain  in effect (meaning this test can be used) for the duration of the COVID-19 declaration under Section 564(b)(1) of the Act, 21 U.S.C.section 360bbb-3(b)(1), unless the authorization is terminated  or revoked sooner.       Influenza A by PCR NEGATIVE NEGATIVE Final   Influenza B by PCR NEGATIVE NEGATIVE Final    Comment: (NOTE) The Xpert Xpress SARS-CoV-2/FLU/RSV plus assay is intended as an aid in the diagnosis of influenza from Nasopharyngeal swab specimens and should not be used as a sole basis for treatment. Nasal washings and aspirates are unacceptable for Xpert Xpress SARS-CoV-2/FLU/RSV testing.  Fact Sheet for Patients: EntrepreneurPulse.com.au  Fact Sheet for Healthcare Providers: IncredibleEmployment.be  This test is not yet approved or cleared by the [place] FDA and has been authorized for detection and/or diagnosis of SARS-CoV-2 by FDA under an Emergency Use Authorization (EUA). This EUA will remain in effect (meaning this test can be used) for the duration of the COVID-19 declaration under Section 564(b)(1) of the Act, 21  U.S.C. section 360bbb-3(b)(1), unless the authorization is terminated or revoked.  Performed at [facility], [place] [address] [place], [place] [address]      Radiological Exams on Admission: DG Chest 2 View  Result Date: 05/25/2020 CLINICAL DATA:  Shortness of breath EXAM: CHEST - 2 VIEW COMPARISON:  01/06/2019 FINDINGS: Chronic interstitial opacities. No definite new consolidation or edema. No pleural effusion. No pneumothorax. Cardiomediastinal contours are within normal limits. Residual contrast within the bowel. IVC filter is noted. Decreased osseous mineralization with age-indeterminate mid thoracic compression fractures. IMPRESSION: Interstitial prominence that may reflect chronic interstitial lung disease. No definite acute abnormality. Age-indeterminate mid thoracic compression fractures. Electronically Signed   By: Macy Mis M.D.   On: 05/25/2020 12:27   CT Angio Chest PE W and/or Wo Contrast  Result Date: 05/25/2020 CLINICAL DATA:  Chest pain or SOB, pleurisy or effusion suspected PE suspected, high prob EXAM: CT ANGIOGRAPHY CHEST WITH CONTRAST TECHNIQUE: Multidetector CT imaging of the chest was performed using the standard protocol during bolus administration of intravenous contrast. Multiplanar CT image reconstructions and MIPs were obtained to evaluate the vascular anatomy. CONTRAST:  159mL OMNIPAQUE IOHEXOL 350 MG/ML SOLN COMPARISON:  Radiograph earlier today. Chest CT 09/14/2011 reviewed. FINDINGS: Cardiovascular: There are no filling defects within the pulmonary arteries to suggest pulmonary embolus. Dilated main pulmonary artery at 4.2 cm. Mild cardiomegaly. Coronary artery calcifications. Mild aortic tortuosity without dissection or aneurysm. Mild aortic atherosclerosis. Mediastinum/Nodes: Prominent mediastinal nodes including a 10 mm prevascular node, series 6, image 36. There is bilateral hilar adenopathy. Largest  lymph node on the right measures 16 mm, largest node on  the left measures 10 mm. Thyroid gland not well-defined. Tiny hiatal hernia. Lungs/Pleura: Interstitial lung disease with subpleural reticulation, areas of interspersed ground-glass and septal thickening. Mild bronchiectasis in the right middle lobe. No frank honeycombing. No recent CT to assess for progression or change. There is no pleural fluid. Imaging obtained in expiration which limits assessment of the trachea and bronchi. No pneumothorax. Upper Abdomen: Assessed on concurrent abdominal CT, reported separately. Musculoskeletal: Exaggerated thoracic kyphosis. Compression fractures at T3, T5, and T7 which were assessed on thoracic spine CT last month, no interval change. L1 compression fracture with vertebral augmentation. No acute osseous abnormalities are seen. Review of the MIP images confirms the above findings. IMPRESSION: 1. No pulmonary embolus. 2. Dilated main pulmonary artery consistent with pulmonary arterial hypertension. 3. Interstitial lung disease with subpleural reticulation, areas of interspersed ground-glass and septal thickening. No frank honeycombing. Consider follow-up high-resolution chest CT on a nonemergent basis for further interstitial lung disease characterization. 4. Mediastinal and bilateral hilar adenopathy, likely reactive. 5. Coronary artery calcifications. 6. Multiple thoracic compression fractures, unchanged from thoracic spine CT last month at [facility]. Aortic Atherosclerosis (ICD10-I70.0). Electronically Signed   By: Keith Rake M.D.   On: 05/25/2020 15:39   CT ABDOMEN PELVIS W CONTRAST  Result Date: 05/25/2020 CLINICAL DATA:  Abdominal distension.  Abdominal pain. EXAM: CT ABDOMEN AND PELVIS WITH CONTRAST TECHNIQUE: Multidetector CT imaging of the abdomen and pelvis was performed using the standard protocol following bolus administration of intravenous contrast. Delayed images are performed and associated with concurrent chest CT. CONTRAST:  151mL OMNIPAQUE  IOHEXOL 350 MG/ML SOLN COMPARISON:  None. FINDINGS: Lower chest: Assessed on concurrent chest CT, reported separately. Hepatobiliary: No focal liver abnormality is seen. Status post cholecystectomy. No biliary dilatation. Pancreas: Diffuse fatty atrophy. No ductal dilatation or inflammation. Spleen: Normal in size. Scattered calcified granuloma. There are 2 low-density lesions, largest measuring 2.2 cm in the anterior mid aspect. Adrenals/Urinary Tract: No adrenal nodule. No hydronephrosis or perinephric edema. Homogeneous renal enhancement. Absent renal excretion on delayed phase imaging. No renal calculi. Urinary bladder is partially distended without wall thickening. Stomach/Bowel: Unremarkable stomach. No small bowel obstruction or inflammation. High-density material/contrast is seen throughout the colon. There is a large volume of colonic stool, primarily in the right colon. Cecum is located in the deep pelvis. Appendix is not confidently visualized, no evidence of appendicitis. Transverse, descending, and sigmoid colon are tortuous. There is no colonic wall thickening or pericolonic edema. No significant diverticular disease. Vascular/Lymphatic: IVC filter in place. Patent portal vein. Normal caliber abdominal aorta. No bulky abdominopelvic adenopathy. Reproductive: Status post hysterectomy. No adnexal masses. Other: No ascites or free air. No intra-abdominal fluid collection. No body wall hernia. Musculoskeletal: L1 compression fracture with vertebral augmentation. Posterior fusion L4-S1. There are no acute or suspicious osseous abnormalities. IMPRESSION: 1. Large volume of colonic stool, primarily in the right colon, can be seen with constipation. No bowel obstruction or inflammation. 2. Absent renal excretion on delayed phase imaging, suggest renal dysfunction. 3. Two low-density splenic lesions, largest measuring 2.2 cm. In the absence of known malignancy, this is likely benign. Aortic Atherosclerosis  (ICD10-I70.0). Electronically Signed   By: Keith Rake M.D.   On: 05/25/2020 15:32      Assessment/Plan Principal Problem:   Acute on chronic respiratory failure with hypoxia (HCC) Active Problems:   COPD (chronic obstructive pulmonary disease) (HCC)   Anxiety state   Seizure disorder ([place])  Interstitial pneumonia ([place])     #1 acute on chronic respiratory failure with hypoxia: Suspect worsening interstitial lung disease.  At this point we will aggressively continuewith steroids.  Antibiotics and supportive care.  Patient appears to have somewhat not responded fully to them to the home regimen.  No significant fever or leukocytosis.  I will therefore not initiate antibiotics.  Titrate oxygen down.  #2 COPD: May be a component of why she is hypoxic in the morning.  Continue breathing treatments  #3 seizure disorder: Confirm on resume home regimen.  #4 history of DVT: Not on anticoagulation at the moment.  DVT prophylaxis will be ordered.  #5 anxiety disorder: Confirm and resume home regimen  #6 GERD: Continue PPIs  #7 interstitial lung disease: As per #1.     DVT prophylaxis: Lovenox Code Status: Full code Family Communication: No family at bedside Disposition Plan: Home Consults called: None Admission status: Inpatient  Severity of Illness: The appropriate patient status for this patient is INPATIENT. Inpatient status is judged to be reasonable and necessary in order to provide the required intensity of service to ensure the patient's safety. The patient's presenting symptoms, physical exam findings, and initial radiographic and laboratory data in the context of their chronic comorbidities is felt to place them at high risk for further clinical deterioration. Furthermore, it is not anticipated that the patient will be medically stable for discharge from the hospital within 2 midnights of admission. The following factors support the patient status of inpatient.   " The  patient's presenting symptoms include shortness of breath. " The worrisome physical exam findings include coarse breath sounds with wheezing. " The initial radiographic and laboratory data are worrisome because of evidence of interstitial lung disease. " The chronic co-morbidities include interstitial lung disease, COPD.   * I certify that at the point of admission it is my clinical judgment that the patient will require inpatient hospital care spanning beyond 2 midnights from the point of admission due to high intensity of service, high risk for further deterioration and high frequency of surveillance required.Barbette Merino MD [facility] Pager [phone]  If 7PM-7AM, please contact night-coverage [email or web address] Password [facility]  05/25/2020, 10:03 PM

## 2020-05-26 ENCOUNTER — Encounter (HOSPITAL_COMMUNITY): Payer: Self-pay | Admitting: Internal Medicine

## 2020-05-26 DIAGNOSIS — J9621 Acute and chronic respiratory failure with hypoxia: Secondary | ICD-10-CM | POA: Diagnosis not present

## 2020-05-26 DIAGNOSIS — F411 Generalized anxiety disorder: Secondary | ICD-10-CM | POA: Diagnosis not present

## 2020-05-26 DIAGNOSIS — G40909 Epilepsy, unspecified, not intractable, without status epilepticus: Secondary | ICD-10-CM | POA: Diagnosis not present

## 2020-05-26 DIAGNOSIS — J849 Interstitial pulmonary disease, unspecified: Secondary | ICD-10-CM | POA: Diagnosis not present

## 2020-05-26 LAB — CBC
HCT: 34.9 % — ABNORMAL LOW (ref 36.0–46.0)
Hemoglobin: 11.2 g/dL — ABNORMAL LOW (ref 12.0–15.0)
MCH: 28.8 pg (ref 26.0–34.0)
MCHC: 32.1 g/dL (ref 30.0–36.0)
MCV: 89.7 fL (ref 80.0–100.0)
Platelets: 183 10*3/uL (ref 150–400)
RBC: 3.89 MIL/uL (ref 3.87–5.11)
RDW: 14.4 % (ref 11.5–15.5)
WBC: 10.2 10*3/uL (ref 4.0–10.5)
nRBC: 0 % (ref 0.0–0.2)

## 2020-05-26 LAB — COMPREHENSIVE METABOLIC PANEL
ALT: 16 U/L (ref 0–44)
AST: 17 U/L (ref 15–41)
Albumin: 3 g/dL — ABNORMAL LOW (ref 3.5–5.0)
Alkaline Phosphatase: 47 U/L (ref 38–126)
Anion gap: 7 (ref 5–15)
BUN: 14 mg/dL (ref 8–23)
CO2: 29 mmol/L (ref 22–32)
Calcium: 8.8 mg/dL — ABNORMAL LOW (ref 8.9–10.3)
Chloride: 101 mmol/L (ref 98–111)
Creatinine, Ser: 0.71 mg/dL (ref 0.44–1.00)
GFR, Estimated: >60 mL/min (ref >60)
Glucose, Bld: 189 mg/dL — ABNORMAL HIGH (ref 70–99)
Potassium: 3.6 mmol/L (ref 3.5–5.1)
Sodium: 137 mmol/L (ref 135–145)
Total Bilirubin: 0.6 mg/dL (ref 0.3–1.2)
Total Protein: 5.8 g/dL — ABNORMAL LOW (ref 6.5–8.1)

## 2020-05-26 MED ORDER — PANTOPRAZOLE SODIUM 40 MG IV SOLR
40.0000 mg | Freq: Two times a day (BID) | INTRAVENOUS | Status: DC
  Administered 2020-05-26 – 2020-05-31 (×11): 40 mg via INTRAVENOUS
  Filled 2020-05-26 (×11): qty 40

## 2020-05-26 MED ORDER — SERTRALINE HCL 100 MG PO TABS
100.0000 mg | ORAL_TABLET | Freq: Every day | ORAL | Status: DC
  Administered 2020-05-26 – 2020-05-31 (×6): 100 mg via ORAL
  Filled 2020-05-26 (×6): qty 1

## 2020-05-26 MED ORDER — VANCOMYCIN HCL 750 MG/150ML IV SOLN
750.0000 mg | Freq: Two times a day (BID) | INTRAVENOUS | Status: AC
  Administered 2020-05-27 – 2020-05-30 (×8): 750 mg via INTRAVENOUS
  Filled 2020-05-26 (×8): qty 150

## 2020-05-26 MED ORDER — SIMVASTATIN 20 MG PO TABS
20.0000 mg | ORAL_TABLET | Freq: Every day | ORAL | Status: DC
  Administered 2020-05-26 – 2020-05-30 (×5): 20 mg via ORAL
  Filled 2020-05-26 (×5): qty 1

## 2020-05-26 MED ORDER — VANCOMYCIN HCL 1250 MG/250ML IV SOLN
1250.0000 mg | Freq: Once | INTRAVENOUS | Status: AC
  Administered 2020-05-26: 1250 mg via INTRAVENOUS
  Filled 2020-05-26: qty 250

## 2020-05-26 MED ORDER — FLUTICASONE PROPIONATE 50 MCG/ACT NA SUSP
2.0000 | Freq: Every day | NASAL | Status: DC
  Administered 2020-05-27 – 2020-05-31 (×5): 2 via NASAL
  Filled 2020-05-26 (×2): qty 16

## 2020-05-26 MED ORDER — TIZANIDINE HCL 4 MG PO TABS
8.0000 mg | ORAL_TABLET | Freq: Three times a day (TID) | ORAL | Status: DC
Start: 2020-05-26 — End: 2020-06-01
  Administered 2020-05-26 – 2020-05-31 (×16): 8 mg via ORAL
  Filled 2020-05-26 (×17): qty 2

## 2020-05-26 MED ORDER — TRAZODONE HCL 100 MG PO TABS
100.0000 mg | ORAL_TABLET | Freq: Every evening | ORAL | Status: DC | PRN
  Administered 2020-05-27 (×2): 100 mg via ORAL
  Administered 2020-05-28 – 2020-05-29 (×2): 200 mg via ORAL
  Filled 2020-05-26: qty 2
  Filled 2020-05-26: qty 1
  Filled 2020-05-26 (×3): qty 2
  Filled 2020-05-26: qty 1

## 2020-05-26 MED ORDER — POLYETHYLENE GLYCOL 3350 17 G PO PACK
17.0000 g | PACK | Freq: Two times a day (BID) | ORAL | Status: AC
  Administered 2020-05-26 – 2020-05-28 (×6): 17 g via ORAL
  Filled 2020-05-26 (×6): qty 1

## 2020-05-26 MED ORDER — SODIUM CHLORIDE 0.9 % IV SOLN
1.0000 g | Freq: Three times a day (TID) | INTRAVENOUS | Status: AC
  Administered 2020-05-26 – 2020-05-30 (×14): 1 g via INTRAVENOUS
  Filled 2020-05-26 (×14): qty 1

## 2020-05-26 MED ORDER — GABAPENTIN 100 MG PO CAPS
100.0000 mg | ORAL_CAPSULE | Freq: Three times a day (TID) | ORAL | Status: DC
  Administered 2020-05-26 – 2020-05-31 (×16): 100 mg via ORAL
  Filled 2020-05-26 (×16): qty 1

## 2020-05-26 MED ORDER — HYDROMORPHONE HCL 2 MG PO TABS
4.0000 mg | ORAL_TABLET | ORAL | Status: DC | PRN
  Administered 2020-05-27 – 2020-05-31 (×10): 4 mg via ORAL
  Filled 2020-05-26 (×10): qty 2

## 2020-05-26 MED ORDER — ASPIRIN EC 81 MG PO TBEC
81.0000 mg | DELAYED_RELEASE_TABLET | Freq: Every day | ORAL | Status: DC
  Administered 2020-05-26 – 2020-05-31 (×6): 81 mg via ORAL
  Filled 2020-05-26 (×6): qty 1

## 2020-05-26 MED ORDER — LEVOTHYROXINE SODIUM 75 MCG PO TABS
175.0000 ug | ORAL_TABLET | Freq: Every day | ORAL | Status: DC
  Administered 2020-05-27 – 2020-05-31 (×5): 175 ug via ORAL
  Filled 2020-05-26 (×5): qty 1

## 2020-05-26 MED ORDER — ALPRAZOLAM 0.5 MG PO TABS
0.5000 mg | ORAL_TABLET | Freq: Two times a day (BID) | ORAL | Status: DC | PRN
  Administered 2020-05-26 – 2020-05-31 (×6): 0.5 mg via ORAL
  Filled 2020-05-26 (×8): qty 1

## 2020-05-26 NOTE — Progress Notes (Signed)
Pharmacy Antibiotic Note  [name] Fitzpatrick is a 65 y.o. female admitted on 05/25/2020 with pneumonia.  Pharmacy has been consulted for vancomycin and meropenem dosing. Pt is afebrile and WBC is WNL. Scr is WNL. Recent admission for treatment of CAP and IPF flair.   Plan: Vancomycin 1250mg  IV x 1 then 750mg  IV Q12H Meropenem 1gm IV Q8H F/u renal fxn, C&S, clinical status and peak/trough at SS  Height: 5\' 2"  (157.5 cm) Weight: 67.6 kg (149 lb) IBW/kg (Calculated) : 50.1  Temp (24hrs), Avg:98.9 F (37.2 C), Min:98.9 F (37.2 C), Max:98.9 F (37.2 C)  Recent Labs  Lab 05/25/20 1129 05/25/20 1321 05/25/20 2211 05/26/20 0234  WBC 11.8*  --  8.7 10.2  CREATININE 0.83  --  0.78 0.71  LATICACIDVEN 1.4 0.9  --   --     Estimated Creatinine Clearance: 64 mL/min (by C-G formula based on SCr of 0.71 mg/dL).    Allergies  Allergen Reactions  . Levofloxacin Shortness Of Breath and Itching    WHELPS WHELPS WHELPS WHELPS   . Penicillins Other (See Comments)    Stroke-like symptoms Has patient had a PCN reaction causing immediate rash, facial/tongue/throat swelling, SOB or lightheadedness with hypotension: YES Has patient had a PCN reaction causing severe rash involving mucus membranes or skin necrosis: No Has patient had a PCN reaction that required hospitalization: No Has patient had a PCN reaction occurring within the last 10 years: Yes If all of the above answers are "NO", then may proceed with Cephalosporin use.   . Prednisone Shortness Of Breath and Nausea And Vomiting  . Cephalexin Hives, Swelling and Rash  . Latex Rash and Other (See Comments)    Causes skin to tear easily  . Metoclopramide Hcl Hives and Rash    Antimicrobials this admission: Vanc 3/10>> Mero 3/10>>  Dose adjustments this admission: N/A  Microbiology results: Pending  Thank you for allowing pharmacy to be a part of this patient's care.  Angi Goodell, Rande Lawman 05/26/2020 12:26 PM

## 2020-05-26 NOTE — ED Notes (Signed)
Pt c/o R sided headache. Requesting pain medication.

## 2020-05-26 NOTE — Consult Note (Signed)
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 Just d/c'd from hospital 3/6 after being treated for working dx of CAP and IPF flare. Re-admitted 4 d after dc for progressive inc WOB and O2 need.   History of Present Illness:  65 year old female with a complicated pulmonary history as outlined below.  Chronic respiratory failure in the context of what has been labeled interstitial pulmonary fibrosis with UIP pattern further complicated by pulmonary fibrotic changes from post ARDS as well as bronchiectasis.  Followed typically by [facility] chest.  Just discharged May 20, 2020 after being hospitalized for acute on chronic respiratory failure for what was labeled as community-acquired pneumonia and ILD flare.  Completed treatments including antibiotics at the hospital and was discharged home on decadron. Even at time of discharge she did not feel as though she had improved significantly. Still had sig WOB and even on day post discharge had gotten up to go to BR and noted pulse ox down to 50s w/ brief activity on 5 liters.   Since that time has developed progressive exertional dyspnea, and increased oxygen need up to 7 L.  Reporting productive cough of white sputum, low-grade temperature 99 blood pressure 80/58 CT angiogram was negative for pulmonary emboli but did show findings consistent with interstitial lung disease as well as pulmonary hypertension.  Was admitted to the hospitalist service for acute on chronic hypoxic respiratory failure pulmonary asked to evaluate to assist  with management Past Medical History:  Followed by [facility] chest for: IPF/ILD (HRCT 12/20 c/w UIP pattern, c/b some post ARDS changes from prolonged illness from June to Sept 2020.  w/ bronchiectasis, Chronic respiratory failure w/ (severe restrictive disease on PFTs and reduced DLCO spiro 12/20). Negative serology. On chronic oxygen 3 -4 liters  Chronic stable lung nodule.  Prior epistaxis  H/o ARDS and prolonged VDRF Prior Trach (decannulated at LTAC)-->2020 Prior MDR UTI RHc and LHC 10/07/18 Normal coronary arteries, Normal cardiac output, Mild Pulmonary HTN  GERD Seizure disorder Fibromyalgia  Chronic pain  Significant Hospital Events:  3/10 admitted  Consults:  pulm   Procedures:     Significant Diagnostic Tests:  CT chest 1. No pulmonary embolus. 2. Dilated main pulmonary artery consistent with pulmonary arterial hypertension.3. Interstitial lung disease with subpleural reticulation, areas of interspersed ground-glass and septal thickening. No frankhoneycombing. 4. Mediastinal and bilateral hilar adenopathy, likely reactive.5. Coronary artery calcifications.6. Multiple thoracic compression fractures, unchanged from thoracic spine CT last month at [facility].  MBS at [facility] 3/1  Pharyngeal phase c/b  -reduced laryngeal elevation -reduced anterior hyoid movement/excursion -reduced laryngeal vestibule closure Above mentioned characteristics resulting in transient penetration above the folds and ejected independently with thin and nectar/mildly thick liquids. Of note, patient with cricopharyngeal hypertrophy/bar that was visualized with all consistencies given. Patient reported something feeling stuck and however no residue visualized following swallows of various consistencies. Retrograde flow through the UES following regular solid was demonstrated   Micro Data:  RVP neg   Antimicrobials:  vanc 3/10 Meropenem 3/10 Interim History / Subjective:  Sig WOB  Objective   Blood pressure 105/78, pulse 82, temperature 98.1 F (36.7 C), temperature source Oral, resp. rate 19, height 5\' 2"  (1.575 m), weight 67.6 kg, SpO2  97 %.        Intake/Output  Summary (Last 24 hours) at 05/26/2020 1013 Last data filed at 05/26/2020 0831 Gross per 24 hour  Intake 1000 ml  Output 1500 ml  Net -500 ml   Filed Weights   05/25/20 1128  Weight: 67.6 kg    Examination: General: frail 65 year old wf currently anxious and having fairly sig WOB HENT: marked upper airway exp wheeze and rhonchi  Lungs: diffuse wheeze and rhonchi 7 lpm  Cardiovascular: tachy rrr Abdomen: soft Extremities: dependent edema  Neuro: awake and oriented  GU: due to void   Resolved Hospital Problem list     Assessment & Plan:  Pulm dxs:  Acute on chronic hypoxic respiratory failure  ILD (UIP) Post ARDS fibrosis  Kyphosis w/ severe restrictive lung disease Dysphagia (verified on MBS 2022) Bronchiectasis  Esophageal reflux Aspiration PNA LPR 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   Plan/rec Resume broad spec abx (note allergies) -->records suggest she can tolerate carbapenems so given recent hospitalization will  Start vanc and meropenem Pulse steroids (note reports "allergic" to pred-->not convinced this is true allergy) Pulse ox and titrate oxygen IS ane flutter Can try BDs PPI BID Would follow strict reflux precautions ->this is fairly important for her When her symptoms are better she needs Esophagram to look for a) stricture and b) assess the degree  of esophageal reflux.  Would also consider CT neck to look for evidence of subglottic stenosis  I do not think she would tolerate bronchoscopy given her degree of frailness and O2 requirements but in ideal world this would be a consideration to get BAL  Hypotension is a barrier to diuresis but eventually would benefit from lasix  There has been talk about palliative during last hospitalization. I think that this would be appropriate. She is not ready for Comfort care but I think limitations such as DNR and DNI would still be very appropriate. Would consider getting them involved to explore this further with the patient and her husband.   Best practice (evaluated daily)  Per Primary   Labs   CBC: Recent Labs  Lab 05/25/20 1129 05/25/20 1315 05/25/20 2211 05/26/20 0234  WBC 11.8*  --  8.7 10.2  NEUTROABS 8.4*  --   --   --   HGB 10.8* 10.2* 11.7* 11.2*  HCT 35.1* 30.0* 37.5 34.9*  MCV 92.6  --  91.5 89.7  PLT 219  --  178 409    Basic Metabolic Panel: Recent Labs  Lab 05/25/20 1129 05/25/20 1315 05/25/20 2211 05/26/20 0234  NA 137 135  --  137  K 3.9 4.1  --  3.6  CL 100  --   --  101  CO2 28  --   --  29  GLUCOSE 103*  --   --  189*  BUN 17  --   --  14  CREATININE 0.83  --  0.78 0.71  CALCIUM 9.4  --   --  8.8*   GFR: Estimated Creatinine Clearance: 64 mL/min (by C-G formula based on SCr of 0.71 mg/dL). Recent Labs  Lab 05/25/20 1129 05/25/20 1321 05/25/20 2211 05/26/20 0234  WBC 11.8*  --  8.7 10.2  LATICACIDVEN 1.4 0.9  --   --     Liver Function Tests: Recent Labs  Lab 05/25/20 1129 05/26/20 0234  AST 17 17  ALT 16 16  ALKPHOS 41 47  BILITOT 0.4 0.6  PROT 5.4* 5.8*  ALBUMIN 2.9* 3.0*   No results for input(s): LIPASE, AMYLASE in the last 168 hours. No results for input(s): AMMONIA in the last 168 hours.  ABG    Component Value Date/Time   PHART 7.410 12/25/2018 1230   PCO2ART 54.7 (H) 12/25/2018 1230   PO2ART 110 (H) 12/25/2018 1230   HCO3  30.8 (H) 05/25/2020 1315   TCO2 32 05/25/2020 1315   ACIDBASEDEF 0.1 01/30/2010 2033   O2SAT 97.0 05/25/2020 1315     Coagulation Profile: Recent Labs  Lab 05/25/20 1222  INR 0.9    Cardiac Enzymes: No results for input(s): CKTOTAL, CKMB, CKMBINDEX, TROPONINI in the last 168 hours.  HbA1C: Hgb A1c MFr Bld  Date/Time Value Ref Range Status  10/25/2014 04:25 PM 5.6 <5.7 % Final    Comment:                                                                           According to the ADA Clinical Practice Recommendations for 2011, when HbA1c is used as a screening test:     >=6.5%   Diagnostic of Diabetes Mellitus            (if abnormal result is confirmed)   5.7-6.4%   Increased risk of developing Diabetes Mellitus   References:Diagnosis and Classification of Diabetes Mellitus,Diabetes Care,2011,34(Suppl 1):S62-S69 and Standards of Medical Care in         Diabetes - 2011,Diabetes YBOF,7510,25 (Suppl 1):S11-S61.     11/04/2013 05:03 PM 5.5 <5.7 % Final    Comment:                                                                           According to the ADA Clinical Practice Recommendations for 2011, when HbA1c is used as a screening test:     >=6.5%   Diagnostic of Diabetes Mellitus            (if abnormal result is confirmed)   5.7-6.4%   Increased risk of developing Diabetes Mellitus   References:Diagnosis and Classification of Diabetes Mellitus,Diabetes ENID,7824,23(NTIRW 1):S62-S69 and Standards of Medical Care in         Diabetes - 2011,Diabetes Care,2011,34 (Suppl 1):S11-S61.      CBG: No results for input(s): GLUCAP in the last 168 hours.  Review of Systems:   Review of Systems  Constitutional: Positive for diaphoresis, fever and malaise/fatigue.  HENT: Positive for congestion and sore throat.   Eyes: Negative.  Respiratory: Positive for cough, sputum production, shortness of breath, wheezing and stridor.   Cardiovascular: Positive for leg swelling and  PND.  Gastrointestinal: Positive for abdominal pain, heartburn, nausea and vomiting.  Genitourinary: Positive for urgency.  Musculoskeletal: Negative.   Skin: Negative.   Neurological: Positive for dizziness.  Endo/Heme/Allergies: Negative.   Psychiatric/Behavioral: The patient is nervous/anxious.      Past Medical History:  She,  has a past medical history of Acute deep vein thrombosis (DVT) of left femoral vein (HCC), Acute on chronic respiratory failure with hypoxia (HCC), Anxiety, ARDS (adult respiratory distress syndrome) (HCC), Chronic LBP, Chronic neck pain, COPD (chronic obstructive pulmonary disease) ([place]), Depression, DM type 2 (diabetes mellitus, type 2) ([place]), Fibromyalgia, GERD (gastroesophageal reflux disease), H/O suicide attempt, Hashimoto's thyroiditis, Hypertension, Hypothyroidism, IBS (irritable bowel syndrome), Interstitial pneumonia ([place]), Migraine headache, Osteoarthritis, Seizure disorder ([place]), and Thyroiditis, 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   Surgical History:   Past Surgical History:  Procedure Laterality Date  . ABDOMINAL HYSTERECTOMY    . b/l foot surgery --bunions    . BIOPSY  07/29/2015   Procedure: BIOPSY;  Surgeon: Rogene Houston, MD;  Location: [facility];  Service: Endoscopy;;  Duodenal,  gastric, and esophageal  biopsies  . C-Spine fusion  1991, 2004  . CHOLECYSTECTOMY    . COLONOSCOPY WITH PROPOFOL N/A 07/29/2015   Procedure: COLONOSCOPY WITH PROPOFOL;  Surgeon: Rogene Houston, MD;  Location: [facility];  Service: Endoscopy;  Laterality: N/A;  8:30  . ESOPHAGEAL DILATION N/A 07/29/2015   Procedure: ESOPHAGEAL DILATION;  Surgeon: Rogene Houston, MD;  Location: [facility];  Service: Endoscopy;  Laterality: N/A;  . ESOPHAGOGASTRODUODENOSCOPY (EGD) WITH PROPOFOL N/A 07/29/2015   Procedure: ESOPHAGOGASTRODUODENOSCOPY (EGD) WITH PROPOFOL;  Surgeon: Rogene Houston, MD;  Location: [facility];  Service: Endoscopy;  Laterality: N/A;  . l carpal tunnel  release Right   . l spine fusion x 2    . reconstucted right ankle    . Right knee arthroscopic surgery    . total reconstruction of right ankle and heel     total of three   . VESICOVAGINAL FISTULA CLOSURE W/ TAH  1993   Fibroids no Ca     Social History:   reports that she quit smoking about 7 years ago. Her smoking use included cigarettes. She has a 12.00 pack-year smoking history. She does not have any smokeless tobacco history on file. She reports current drug use. She reports that she does not drink alcohol.   Family History:  Her family history includes Cancer in her father, maternal grandfather, and another family member; Diabetes in her mother; GI problems in her mother; Heart disease in her mother; Heart failure in her mother; Pancreatic cancer in her father; Thyroid disease in her sister.   Allergies Allergies  Allergen Reactions  . Levofloxacin Shortness Of Breath and Itching    WHELPS WHELPS WHELPS WHELPS   . Penicillins Other (See Comments)    Stroke-like symptoms Has patient had a PCN reaction causing immediate rash, facial/tongue/throat swelling, SOB or lightheadedness with hypotension: YES Has patient had a PCN reaction causing severe rash involving mucus membranes or skin necrosis: No Has patient had a PCN reaction that required hospitalization: No Has patient had a PCN reaction occurring within the last 10 years: Yes If all of the above answers are "NO", then may proceed with Cephalosporin use.   . Prednisone Shortness Of Breath and Nausea And Vomiting  . Cephalexin Hives, Swelling and Rash  .  Latex Rash and Other (See Comments)    Causes skin to tear easily  . Metoclopramide Hcl Hives and Rash     Home Medications  Prior to Admission medications   Medication Sig Start Date End Date Taking? Authorizing Provider  alendronate (FOSAMAX) 70 MG tablet Take 70 mg by mouth once a week. 04/18/20  Yes [provider]  ALPRAZolam Duanne Moron) 0.5 MG tablet Take  0.5 mg by mouth 2 (two) times daily as needed. 05/20/20  Yes [provider]  aspirin EC 81 MG tablet Take 81 mg by mouth daily.   Yes [provider]  benzonatate (TESSALON) 200 MG capsule Take 1 capsule by mouth 3 (three) times daily. For 7 days 05/20/20  Yes [provider]  clonazePAM (KLONOPIN) 1 MG tablet Take 1 mg by mouth 2 (two) times daily as needed. 05/20/20  Yes [provider]  dicyclomine (BENTYL) 10 MG capsule TAKE 1 CAPSULE BY MOUTH TWICE DAILY. 09/03/14  Yes Fayrene Helper, MD  fluticasone [facility]) 50 MCG/ACT nasal spray PLACE 2 SPRAYS INTO BOTH NOSTRILS DAILY 04/11/16  Yes Fayrene Helper, MD  gabapentin (NEURONTIN) 100 MG capsule Take 100 mg by mouth 3 (three) times daily. 05/10/20  Yes [provider]  HYDROmorphone (DILAUDID) 4 MG tablet Take 4 mg by mouth every 4 (four) hours as needed for moderate pain. 03/23/15  Yes [provider]  levothyroxine (SYNTHROID) 175 MCG tablet Take 175 mcg by mouth daily. 03/31/20  Yes [provider]  pantoprazole (PROTONIX) 40 MG tablet Take 40 mg by mouth 2 (two) times daily. 05/20/20  Yes [provider]  promethazine (PHENERGAN) 25 MG tablet Take 1 tablet (25 mg total) by mouth daily as needed for nausea or vomiting. 07/07/15  Yes Fayrene Helper, MD  sertraline (ZOLOFT) 100 MG tablet Take 100 mg by mouth daily.   Yes [provider]  simvastatin (ZOCOR) 20 MG tablet Take 20 mg by mouth at bedtime. 03/28/20  Yes [provider]  tiZANidine (ZANAFLEX) 4 MG tablet Take 8 mg by mouth 3 (three) times daily. 05/09/20  Yes [provider]  traZODone (DESYREL) 100 MG tablet Take 100-200 mg by mouth at bedtime as needed for sleep.   Yes [provider]  methadone (DOLOPHINE) 10 MG tablet Take 1.5 tablets (15 mg total) by mouth 3 (three) times daily. Patient taking differently: Take 10-15 mg by mouth 3 (three) times daily. Takes 1-1.5 tablets  depending on her pain level. 11/16/13   Jonetta Osgood, MD     Critical care time: NA   Erick Colace ACNP-BC [place] Pager # [phone] OR # [phone] if no answer

## 2020-05-26 NOTE — Progress Notes (Addendum)
PROGRESS NOTE                                                                             PROGRESS NOTE                                                                                                                                                                                                             Patient Demographics:    [name] Fitzpatrick, is a 65 y.o. female, DOB - 23-Mar-1955, EXB:[id number]  Outpatient Primary MD for the patient is Malka So., MD    LOS - 1  Admit date - 05/25/2020    Chief Complaint  Patient presents with   Shortness of Breath       Brief Narrative    65 y.o. female with medical history significant of pulmonary fibrosis, seizure disorder chronic respiratory failure, history of ARDS, chronic obstructive lung disease, fibromyalgia, GERD, hypothyroidism, essential hypertension, history of thyroiditis and diabetes who was just discharged on March 4 from [facility] where she was admitted with acute on chronic respiratory failure with pulmonary fibrosis and community-acquired pneumonia.  Patient completed treatment and was discharged home.  She was not on any antibiotics.  Patient comes back to ED with worsening shortness of breath and wheezing, she was noted with increased oxygen requirement, she was admitted for further management of acute on chronic hypoxic respiratory failure.   Subjective:    [name] Fitzpatrick today denies any fever or chills, reports worsening dyspnea, cough, and generalized weakness.      Assessment  & Plan :    Principal Problem:   Acute on chronic respiratory failure with hypoxia (HCC) Active Problems:   COPD (chronic obstructive pulmonary disease) (HCC)   Anxiety state   Seizure disorder (HCC)   Interstitial pneumonia (HCC)  Acute on chronic respiratory failure with hypoxi. -This appears to be multifocal hypoxic respiratory failure in the setting of  recurrent aspiration pneumonia, superimposed on underlying ILD and post ARDS fibrosis. -Pulmonary input greatly appreciated, continue with broad-spectrum  antibiotics. -Continue with pulse steroids. -Interval spirometry and flutter valve. -Continue with Protonix twice daily. -Follow strict reflux precautions.  COPD:  -May be a component of why she is hypoxic in the morning.  Continue breathing treatments, she is on pulse steroids  Chronic pain syndrome -Continue home medications  history of DVT: Not on anticoagulation at the moment.  DVT prophylaxis will be ordered.  Anxiety disorder: Confirm and resume home regimen  GERD: Continue PPIs     SpO2: 99 % O2 Flow Rate (L/min): 6 L/min  Recent Labs  Lab 05/25/20 1129 05/25/20 1222 05/25/20 1305 05/25/20 1321 05/25/20 2211 05/26/20 0234  WBC 11.8*  --   --   --  8.7 10.2  PLT 219  --   --   --  178 183  AST 17  --   --   --   --  17  ALT 16  --   --   --   --  16  ALKPHOS 41  --   --   --   --  47  BILITOT 0.4  --   --   --   --  0.6  ALBUMIN 2.9*  --   --   --   --  3.0*  INR  --  0.9  --   --   --   --   LATICACIDVEN 1.4  --   --  0.9  --   --   SARSCOV2NAA  --   --  NEGATIVE  --   --   --        ABG     Component Value Date/Time   PHART 7.410 12/25/2018 1230   PCO2ART 54.7 (H) 12/25/2018 1230   PO2ART 110 (H) 12/25/2018 1230   HCO3 30.8 (H) 05/25/2020 1315   TCO2 32 05/25/2020 1315   ACIDBASEDEF 0.1 01/30/2010 2033   O2SAT 97.0 05/25/2020 1315      Condition - Extremely Guarded  Family Communication  :  None at bedside  Code Status :  Full  Consults  :  PCCM  Procedures  :  none  PUD Prophylaxis : PPI  Disposition Plan  :    Status is: Inpatient  Remains inpatient appropriate because:IV treatments appropriate due to intensity of illness or inability to take PO   Dispo: The patient is from: Home              Anticipated d/c is to: Home              Patient currently is not medically  stable to d/c.   Difficult to place patient No      DVT Prophylaxis  :  Lovenox   Lab Results  Component Value Date   PLT 183 05/26/2020    Diet :  Diet Order            Diet Heart Room service appropriate? Yes; Fluid consistency: Thin  Diet effective now                  Inpatient Medications  Scheduled Meds:  albuterol  2.5 mg Nebulization Q6H   enoxaparin (LOVENOX) injection  40 mg Subcutaneous Q24H   methylPREDNISolone (SOLU-MEDROL) injection  40 mg Intravenous Q6H   pantoprazole (PROTONIX) IV  40 mg Intravenous Q12H   polyethylene glycol  17 g Oral BID   Continuous Infusions:  meropenem (MERREM) IV     vancomycin     [START ON 05/27/2020] vancomycin  PRN Meds:.acetaminophen **OR** acetaminophen, morphine injection, ondansetron (ZOFRAN) IV, ondansetron **OR** ondansetron (ZOFRAN) IV, promethazine  Antibiotics  :    Anti-infectives (From admission, onward)   Start     Dose/Rate Route Frequency Ordered Stop   05/27/20 0300  vancomycin (VANCOREADY) IVPB 750 mg/150 mL        750 mg 150 mL/hr over 60 Minutes Intravenous Every 12 hours 05/26/20 1220     05/26/20 1300  meropenem (MERREM) 1 g in sodium chloride 0.9 % 100 mL IVPB        1 g 200 mL/hr over 30 Minutes Intravenous Every 8 hours 05/26/20 1218     05/26/20 1300  vancomycin (VANCOREADY) IVPB 1250 mg/250 mL        1,250 mg 166.7 mL/hr over 90 Minutes Intravenous  Once 05/26/20 1220         Adeyemi Hamad M.D on 05/26/2020 at 1:26 PM  To page go to [email or web address]   [facility] -  Office  [phone]    Objective:   Vitals:   05/26/20 1130 05/26/20 1137 05/26/20 1253 05/26/20 1258  BP: (!) 100/46   112/61  Pulse: 86   81  Resp: 14   20  Temp:  98.9 F (37.2 C)  98.6 F (37 C)  TempSrc:  Oral    SpO2: 99%   99%  Weight:   71.4 kg   Height:   5\' 2"  (1.575 m)     Wt Readings from Last 3 Encounters:  05/26/20 71.4 kg  07/29/15 83.9 kg  07/25/15 83.9 kg      Intake/Output Summary (Last 24 hours) at 05/26/2020 1326 Last data filed at 05/26/2020 0831 Gross per 24 hour  Intake 1000 ml  Output 1500 ml  Net -500 ml     Physical Exam  Awake Alert, No new F.N deficits, Normal affect Symmetrical Chest wall movement, diffuse wheezing and rhonchi, on 7 L nasal cannula Tachycardic but regular,No Gallops,Rubs or new Murmurs, No Parasternal Heave +ve B.Sounds, Abd Soft, No tenderness, , No rebound - guarding or rigidity. No Cyanosis, Clubbing ,trace edema, No new Rash or bruise      Data Review:    CBC Recent Labs  Lab 05/25/20 1129 05/25/20 1315 05/25/20 2211 05/26/20 0234  WBC 11.8*  --  8.7 10.2  HGB 10.8* 10.2* 11.7* 11.2*  HCT 35.1* 30.0* 37.5 34.9*  PLT 219  --  178 183  MCV 92.6  --  91.5 89.7  MCH 28.5  --  28.5 28.8  MCHC 30.8  --  31.2 32.1  RDW 14.8  --  14.6 14.4  LYMPHSABS 2.1  --   --   --   MONOABS 0.8  --   --   --   EOSABS 0.5  --   --   --   BASOSABS 0.0  --   --   --     Recent Labs  Lab 05/25/20 1129 05/25/20 1222 05/25/20 1315 05/25/20 1321 05/25/20 2211 05/26/20 0234  NA 137  --  135  --   --  137  K 3.9  --  4.1  --   --  3.6  CL 100  --   --   --   --  101  CO2 28  --   --   --   --  29  GLUCOSE 103*  --   --   --   --  189*  BUN 17  --   --   --   --  14  CREATININE 0.83  --   --   --  0.78 0.71  CALCIUM 9.4  --   --   --   --  8.8*  AST 17  --   --   --   --  17  ALT 16  --   --   --   --  16  ALKPHOS 41  --   --   --   --  47  BILITOT 0.4  --   --   --   --  0.6  ALBUMIN 2.9*  --   --   --   --  3.0*  LATICACIDVEN 1.4  --   --  0.9  --   --   INR  --  0.9  --   --   --   --     ------------------------------------------------------------------------------------------------------------------ No results for input(s): CHOL, HDL, LDLCALC, TRIG, CHOLHDL, LDLDIRECT in the last 72 hours.  Lab Results  Component Value Date   HGBA1C 5.6 10/25/2014    ------------------------------------------------------------------------------------------------------------------ No results for input(s): TSH, T4TOTAL, T3FREE, THYROIDAB in the last 72 hours.  Invalid input(s): FREET3  Cardiac Enzymes No results for input(s): CKMB, TROPONINI, MYOGLOBIN in the last 168 hours.  Invalid input(s): CK ------------------------------------------------------------------------------------------------------------------    Component Value Date/Time   BNP 46.5 12/27/2018 0505    Micro Results Recent Results (from the past 240 hour(s))  Resp Panel by RT-PCR (Flu A&B, Covid) Nasopharyngeal Swab     Status: None   Collection Time: 05/25/20  1:05 PM   Specimen: Nasopharyngeal Swab; Nasopharyngeal(NP) swabs in vial transport medium  Result Value Ref Range Status   SARS Coronavirus 2 by RT PCR NEGATIVE NEGATIVE Final    Comment: (NOTE) SARS-CoV-2 target nucleic acids are NOT DETECTED.  The SARS-CoV-2 RNA is generally detectable in upper respiratory specimens during the acute phase of infection. The lowest concentration of SARS-CoV-2 viral copies this assay can detect is 138 copies/mL. A negative result does not preclude SARS-Cov-2 infection and should not be used as the sole basis for treatment or other patient management decisions. A negative result may occur with  improper specimen collection/handling, submission of specimen other than nasopharyngeal swab, presence of viral mutation(s) within the areas targeted by this assay, and inadequate number of viral copies(<138 copies/mL). A negative result must be combined with clinical observations, patient history, and epidemiological information. The expected result is Negative.  Fact Sheet for Patients:  BloggerCourse.com  Fact Sheet for Healthcare Providers:  SeriousBroker.it  This test is no t yet approved or cleared by the [place] FDA and  has  been authorized for detection and/or diagnosis of SARS-CoV-2 by FDA under an Emergency Use Authorization (EUA). This EUA will remain  in effect (meaning this test can be used) for the duration of the COVID-19 declaration under Section 564(b)(1) of the Act, 21 U.S.C.section 360bbb-3(b)(1), unless the authorization is terminated  or revoked sooner.       Influenza A by PCR NEGATIVE NEGATIVE Final   Influenza B by PCR NEGATIVE NEGATIVE Final    Comment: (NOTE) The Xpert Xpress SARS-CoV-2/FLU/RSV plus assay is intended as an aid in the diagnosis of influenza from Nasopharyngeal swab specimens and should not be used as a sole basis for treatment. Nasal washings and aspirates are unacceptable for Xpert Xpress SARS-CoV-2/FLU/RSV testing.  Fact Sheet for Patients: BloggerCourse.com  Fact Sheet for Healthcare Providers: SeriousBroker.it  This test is not yet approved or cleared by the [place] FDA and has  been authorized for detection and/or diagnosis of SARS-CoV-2 by FDA under an Emergency Use Authorization (EUA). This EUA will remain in effect (meaning this test can be used) for the duration of the COVID-19 declaration under Section 564(b)(1) of the Act, 21 U.S.C. section 360bbb-3(b)(1), unless the authorization is terminated or revoked.  Performed at [facility], [address] [place], [place] [address]     Radiology Reports DG Chest 2 View  Result Date: 05/25/2020 CLINICAL DATA:  Shortness of breath EXAM: CHEST - 2 VIEW COMPARISON:  01/06/2019 FINDINGS: Chronic interstitial opacities. No definite new consolidation or edema. No pleural effusion. No pneumothorax. Cardiomediastinal contours are within normal limits. Residual contrast within the bowel. IVC filter is noted. Decreased osseous mineralization with age-indeterminate mid thoracic compression fractures. IMPRESSION: Interstitial prominence that may reflect chronic  interstitial lung disease. No definite acute abnormality. Age-indeterminate mid thoracic compression fractures. Electronically Signed   By: Guadlupe Spanish M.D.   On: 05/25/2020 12:27   CT Angio Chest PE W and/or Wo Contrast  Result Date: 05/25/2020 CLINICAL DATA:  Chest pain or SOB, pleurisy or effusion suspected PE suspected, high prob EXAM: CT ANGIOGRAPHY CHEST WITH CONTRAST TECHNIQUE: Multidetector CT imaging of the chest was performed using the standard protocol during bolus administration of intravenous contrast. Multiplanar CT image reconstructions and MIPs were obtained to evaluate the vascular anatomy. CONTRAST:  OMNIPAQUE IOHEXOL 350 MG/ML SOLN COMPARISON:  Radiograph earlier today. Chest CT 09/14/2011 reviewed. FINDINGS: Cardiovascular: There are no filling defects within the pulmonary arteries to suggest pulmonary embolus. Dilated main pulmonary artery at 4.2 cm. Mild cardiomegaly. Coronary artery calcifications. Mild aortic tortuosity without dissection or aneurysm. Mild aortic atherosclerosis. Mediastinum/Nodes: Prominent mediastinal nodes including a 10 mm prevascular node, series 6, image 36. There is bilateral hilar adenopathy. Largest lymph node on the right measures 16 mm, largest node on the left measures 10 mm. Thyroid gland not well-defined. Tiny hiatal hernia. Lungs/Pleura: Interstitial lung disease with subpleural reticulation, areas of interspersed ground-glass and septal thickening. Mild bronchiectasis in the right middle lobe. No frank honeycombing. No recent CT to assess for progression or change. There is no pleural fluid. Imaging obtained in expiration which limits assessment of the trachea and bronchi. No pneumothorax. Upper Abdomen: Assessed on concurrent abdominal CT, reported separately. Musculoskeletal: Exaggerated thoracic kyphosis. Compression fractures at T3, T5, and T7 which were assessed on thoracic spine CT last month, no interval change. L1 compression fracture with  vertebral augmentation. No acute osseous abnormalities are seen. Review of the MIP images confirms the above findings. IMPRESSION: 1. No pulmonary embolus. 2. Dilated main pulmonary artery consistent with pulmonary arterial hypertension. 3. Interstitial lung disease with subpleural reticulation, areas of interspersed ground-glass and septal thickening. No frank honeycombing. Consider follow-up high-resolution chest CT on a nonemergent basis for further interstitial lung disease characterization. 4. Mediastinal and bilateral hilar adenopathy, likely reactive. 5. Coronary artery calcifications. 6. Multiple thoracic compression fractures, unchanged from thoracic spine CT last month at [facility]. Aortic Atherosclerosis (ICD10-I70.0). Electronically Signed   By: Narda Rutherford M.D.   On: 05/25/2020 15:39   CT ABDOMEN PELVIS W CONTRAST  Result Date: 05/25/2020 CLINICAL DATA:  Abdominal distension.  Abdominal pain. EXAM: CT ABDOMEN AND PELVIS WITH CONTRAST TECHNIQUE: Multidetector CT imaging of the abdomen and pelvis was performed using the standard protocol following bolus administration of intravenous contrast. Delayed images are performed and associated with concurrent chest CT. CONTRAST:  OMNIPAQUE IOHEXOL 350 MG/ML SOLN COMPARISON:  None. FINDINGS: Lower chest: Assessed  on concurrent chest CT, reported separately. Hepatobiliary: No focal liver abnormality is seen. Status post cholecystectomy. No biliary dilatation. Pancreas: Diffuse fatty atrophy. No ductal dilatation or inflammation. Spleen: Normal in size. Scattered calcified granuloma. There are 2 low-density lesions, largest measuring 2.2 cm in the anterior mid aspect. Adrenals/Urinary Tract: No adrenal nodule. No hydronephrosis or perinephric edema. Homogeneous renal enhancement. Absent renal excretion on delayed phase imaging. No renal calculi. Urinary bladder is partially distended without wall thickening. Stomach/Bowel: Unremarkable stomach. No  small bowel obstruction or inflammation. High-density material/contrast is seen throughout the colon. There is a large volume of colonic stool, primarily in the right colon. Cecum is located in the deep pelvis. Appendix is not confidently visualized, no evidence of appendicitis. Transverse, descending, and sigmoid colon are tortuous. There is no colonic wall thickening or pericolonic edema. No significant diverticular disease. Vascular/Lymphatic: IVC filter in place. Patent portal vein. Normal caliber abdominal aorta. No bulky abdominopelvic adenopathy. Reproductive: Status post hysterectomy. No adnexal masses. Other: No ascites or free air. No intra-abdominal fluid collection. No body wall hernia. Musculoskeletal: L1 compression fracture with vertebral augmentation. Posterior fusion L4-S1. There are no acute or suspicious osseous abnormalities. IMPRESSION: 1. Large volume of colonic stool, primarily in the right colon, can be seen with constipation. No bowel obstruction or inflammation. 2. Absent renal excretion on delayed phase imaging, suggest renal dysfunction. 3. Two low-density splenic lesions, largest measuring 2.2 cm. In the absence of known malignancy, this is likely benign. Aortic Atherosclerosis (ICD10-I70.0). Electronically Signed   By: Narda Rutherford M.D.   On: 05/25/2020 15:32

## 2020-05-27 ENCOUNTER — Inpatient Hospital Stay (HOSPITAL_COMMUNITY): Payer: Medicare Other

## 2020-05-27 DIAGNOSIS — F411 Generalized anxiety disorder: Secondary | ICD-10-CM | POA: Diagnosis not present

## 2020-05-27 DIAGNOSIS — J449 Chronic obstructive pulmonary disease, unspecified: Secondary | ICD-10-CM | POA: Diagnosis not present

## 2020-05-27 DIAGNOSIS — J9621 Acute and chronic respiratory failure with hypoxia: Secondary | ICD-10-CM | POA: Diagnosis not present

## 2020-05-27 LAB — BASIC METABOLIC PANEL
Anion gap: 7 (ref 5–15)
BUN: 11 mg/dL (ref 8–23)
CO2: 26 mmol/L (ref 22–32)
Calcium: 8.8 mg/dL — ABNORMAL LOW (ref 8.9–10.3)
Chloride: 104 mmol/L (ref 98–111)
Creatinine, Ser: 0.56 mg/dL (ref 0.44–1.00)
GFR, Estimated: >60 mL/min (ref >60)
Glucose, Bld: 148 mg/dL — ABNORMAL HIGH (ref 70–99)
Potassium: 4.2 mmol/L (ref 3.5–5.1)
Sodium: 137 mmol/L (ref 135–145)

## 2020-05-27 LAB — CBC
HCT: 30.8 % — ABNORMAL LOW (ref 36.0–46.0)
Hemoglobin: 10 g/dL — ABNORMAL LOW (ref 12.0–15.0)
MCH: 28.8 pg (ref 26.0–34.0)
MCHC: 32.5 g/dL (ref 30.0–36.0)
MCV: 88.8 fL (ref 80.0–100.0)
Platelets: 170 10*3/uL (ref 150–400)
RBC: 3.47 MIL/uL — ABNORMAL LOW (ref 3.87–5.11)
RDW: 14.6 % (ref 11.5–15.5)
WBC: 10 10*3/uL (ref 4.0–10.5)
nRBC: 0 % (ref 0.0–0.2)

## 2020-05-27 MED ORDER — GUAIFENESIN-DM 100-10 MG/5ML PO SYRP
5.0000 mL | ORAL_SOLUTION | ORAL | Status: DC | PRN
  Administered 2020-05-27 – 2020-05-31 (×4): 5 mL via ORAL
  Filled 2020-05-27 (×4): qty 5

## 2020-05-27 MED ORDER — GUAIFENESIN ER 600 MG PO TB12
1200.0000 mg | ORAL_TABLET | Freq: Two times a day (BID) | ORAL | Status: DC
  Administered 2020-05-27 – 2020-05-31 (×9): 1200 mg via ORAL
  Filled 2020-05-27 (×9): qty 2

## 2020-05-27 NOTE — Progress Notes (Signed)
NAME:  [name] Fitzpatrick, MRN:  [id number], DOB:  1956/01/05, LOS: 2 ADMISSION DATE:  05/25/2020, CONSULTATION DATE:  3/10 REFERRING MD:  E;gergawy, CHIEF COMPLAINT:  Dyspnea   Brief History:  65 year old female w/ chronic resp failure d/t ILD (felt UIP pattern) c/b BTX and post ARDS pulm fibrotic changes. Just d/c'd from hospital 3/6 after being treated for working dx of CAP and IPF flare. Re-admitted 4 d after dc for progressive inc WOB and O2 need.    Past Medical History:  Followed by [facility] chest for: IPF/ILD (HRCT 12/20 c/w UIP pattern, c/b some post ARDS changes from prolonged illness from June to Sept 2020. w/ bronchiectasis, Chronic respiratory failure w/ (severe restrictive disease on PFTs and reduced DLCO spiro 12/20). Negative serology. On chronic oxygen 3 -4 liters  Chronic stable lung nodule.  Prior epistaxis  H/o ARDS and prolonged VDRF Prior Trach (decannulated at LT[facility])-->2020 Prior MDR UTI RHc and LHC 10/07/18 Normal coronary arteries, Normal cardiac output, Mild Pulmonary HTN  GERD Seizure disorder Fibromyalgia  Chronic pain  Significant Hospital Events:  3/10 admitted  Consults:  pulm   Procedures:     Significant Diagnostic Tests:  CT chest 1. No pulmonary embolus. 2. Dilated main pulmonary artery consistent with pulmonary arterial hypertension.3. Interstitial lung disease with subpleural reticulation, areas of interspersed ground-glass and septal thickening. No frankhoneycombing. 4. Mediastinal and bilateral hilar adenopathy, likely reactive.5. Coronary artery calcifications.6. Multiple thoracic compression fractures, unchanged from thoracic spine CT last month at [facility].  MBS at [facility] 3/1  Pharyngeal phase c/b  -reduced laryngeal elevation -reduced anterior hyoid movement/excursion -reduced laryngeal vestibule closure Above mentioned characteristics resulting in transient penetration above the folds and ejected independently with thin and  nectar/mildly thick liquids. Of note, patient with cricopharyngeal hypertrophy/bar that was visualized with all consistencies given. Patient reported something feeling stuck and however no residue visualized following swallows of various consistencies. Retrograde flow through the UES following regular solid was demonstrated   Micro Data:  RVP neg   Antimicrobials:  vanc 3/10 Meropenem 3/10 Interim History / Subjective:  Feels marginally better   Objective   Blood pressure 101/70, pulse (Abnormal) 56, temperature 98.1 F (36.7 C), temperature source Oral, resp. rate 19, height 5\' 2"  (1.575 m), weight 71.4 kg, SpO2 97 %.       No intake or output data in the 24 hours ending 05/27/20 1119 Filed Weights   05/25/20 1128 05/26/20 1253  Weight: 67.6 kg 71.4 kg    Examination: General sitting up in chair. Not in distress HENT still has sig turbulent Upper airway noises. 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  -she looks marginally better on hospital day 2, still has fairly sig Upper airway cough but overall upper airway noises a little better. I think given how significant her lung disease is getting control of her LPR and ruling out subglottic stenosis is a important piece of this puzzle    Plan/rec Day 2 vanc meropenem (has sig allergy profile) Cont pulse steroids w/ plan to start taper over the weekend (she says she is allergic to pred so we can do medrol taper) Cont IS and flutter BDs scheduled PPI to continue BID Would focus on reflux precautions Has MBS scheduled. Will also order esophagram. Might be easier to just get them both at same time as I do think esophageal reflux/dysfxn is a significant contributing factor to why she came back Eventually needs CT neck to r/o subglottis stenosis from her prior trach  Would consider diuretic when sable   Best practice (evaluated daily)  Per Primary    Critical care time: NA   Erick Colace ACNP-BC [place] Pager # [phone] OR # [phone] if no answer

## 2020-05-27 NOTE — Progress Notes (Signed)
Modified Barium Swallow Progress Note  Patient Details  Name: [name] Fitzpatrick MRN: [id number] Date of Birth: [birth year]  Today's Date: 05/27/2020  Modified Barium Swallow completed.  Full report located under Chart Review in the Imaging Section.  Brief recommendations include the following:  Clinical Impression  Pt's swallow function appears similar to that which was noted at [facility] on 3/1 with pharyngeal dysphagia characterized by reduced anterior laryngeal movement, reduced hyolaryngeal elevation, and reduced laryngeal closure secondary to incomplete epiglottic inversion. However, laryngeal invasion has worsed from PAS 2 to PAS 3 and it is anticipated that consistent aspiration would have been an eventuality if compensatory coughing was not intermittently prompted by the SLP. Coughing was also occasionally noted between swallows and it is possible that some instances of penetration did result in aspiration. No functional benefit was noted with a chin tuck posture or with a chin tuck posture which was paired with left head turn. However, a chin tuck posture with right head turn eliminated penetration. No instances of penetration or aspiration were noted with nectar thick liquids. 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 It is recommended that the pt's current diet be continued with strict observance of swallowing precautions. Pt has been on thickened liquids in the past and has indicated that she would rather use postural modifications than have nectar thick liquids. SLP will follow to assess diet tolerance and to determine need to diet modification pending compliance with swallowing precautions.   Swallow Evaluation Recommendations       SLP Diet Recommendations: Regular solids;Thin liquid   Liquid Administration via: Cup;Straw   Medication Administration: Whole meds with puree   Supervision: Patient able to self feed   Compensations: Slow rate;Small sips/bites;Chin tuck  (chin tuck with right head turn)   Postural Changes: Seated upright at 90 degrees;Remain semi-upright after after feeds/meals (Comment)          Evangelina Delancey I. Hardin Negus, [place], [place] Office number [phone] Pager [phone]  Horton Marshall 05/27/2020,3:43 PM

## 2020-05-27 NOTE — Progress Notes (Signed)
PROGRESS NOTE                                                                             PROGRESS NOTE                                                                                                                                                                                                             Patient Demographics:    [name] Fitzpatrick, is a 65 y.o. female, DOB - 1955-06-20, WGN:[id number]  Outpatient Primary MD for the patient is Malka So., MD    LOS - 2  Admit date - 05/25/2020    Chief Complaint  Patient presents with  . Shortness of Breath       Brief Narrative    65 y.o. female with medical history significant of pulmonary fibrosis, seizure disorder chronic respiratory failure, history of ARDS, chronic obstructive lung disease, fibromyalgia, GERD, hypothyroidism, essential hypertension, history of thyroiditis and diabetes who was just discharged on March 4 from [facility] health [facility] where she was admitted with acute on chronic respiratory failure with pulmonary fibrosis and community-acquired pneumonia.  Patient completed treatment and was discharged home.  She was not on any antibiotics.  Patient comes back to ED with worsening shortness of breath and wheezing, she was noted with increased oxygen requirement, she was admitted for further management of acute on chronic hypoxic respiratory failure.   Subjective:    Carrington Calendine today still complains of cough, shortness of breath, asking about her home pain regimen to be resumed, which I have informed her that it is already resumed.    Assessment  & Plan :    Principal Problem:   Acute on chronic respiratory failure with hypoxia (HCC) Active Problems:   COPD (chronic obstructive pulmonary disease) (HCC)   Anxiety state   Seizure disorder (HCC)   Interstitial pneumonia (HCC)  Acute on chronic respiratory failure with hypoxi. -This appears to be  multifocal hypoxic respiratory failure in the setting of recurrent aspiration pneumonia, superimposed on underlying  ILD and post ARDS fibrosis. -Pulmonary input greatly appreciated, continue with broad-spectrum antibiotics.  She remains on vancomycin and meropenem given her significant allergy profile -Continue with pulse steroids , will start tapering over the weekend -Interval spirometry and flutter valve. -Continue with Protonix twice daily. -Follow strict reflux precautions. -SLP consulted, plan for MBS and Esophageogram  COPD:  -May be a component of why she is hypoxic in the morning.  Continue breathing treatments, she is on pulse steroids  Chronic pain syndrome -Continue home medications  history of DVT: Not on anticoagulation at the moment.    She is currently on DVT prophylaxis dose  Anxiety disorder:  Patient reports she is not taking Klonopin, continue with Xanax.  GERD: Continue PPIs     SpO2: 98 % O2 Flow Rate (L/min): 5 L/min  Recent Labs  Lab 05/25/20 1129 05/25/20 1222 05/25/20 1305 05/25/20 1321 05/25/20 2211 05/26/20 0234 05/27/20 0240  WBC 11.8*  --   --   --  8.7 10.2 10.0  PLT 219  --   --   --  178 183 170  AST 17  --   --   --   --  17  --   ALT 16  --   --   --   --  16  --   ALKPHOS 41  --   --   --   --  47  --   BILITOT 0.4  --   --   --   --  0.6  --   ALBUMIN 2.9*  --   --   --   --  3.0*  --   INR  --  0.9  --   --   --   --   --   LATICACIDVEN 1.4  --   --  0.9  --   --   --   SARSCOV2NAA  --   --  NEGATIVE  --   --   --   --        ABG     Component Value Date/Time   PHART 7.410 12/25/2018 1230   PCO2ART 54.7 (H) 12/25/2018 1230   PO2ART 110 (H) 12/25/2018 1230   HCO3 30.8 (H) 05/25/2020 1315   TCO2 32 05/25/2020 1315   ACIDBASEDEF 0.1 01/30/2010 2033   O2SAT 97.0 05/25/2020 1315      Condition - Extremely Guarded  Family Communication  :  None at bedside  Code Status :  Full  Consults  :  PCCM  Procedures  :   none  PUD Prophylaxis : PPI  Disposition Plan  :    Status is: Inpatient  Remains inpatient appropriate because:IV treatments appropriate due to intensity of illness or inability to take PO   Dispo: The patient is from: Home              Anticipated d/c is to: Home              Patient currently is not medically stable to d/c.   Difficult to place patient No      DVT Prophylaxis  :  Lovenox   Lab Results  Component Value Date   PLT 170 05/27/2020    Diet :  Diet Order            Diet Heart Room service appropriate? Yes; Fluid consistency: Thin  Diet effective now  Inpatient Medications  Scheduled Meds: . albuterol  2.5 mg Nebulization Q6H  . aspirin EC  81 mg Oral Daily  . enoxaparin (LOVENOX) injection  40 mg Subcutaneous Q24H  . fluticasone  2 spray Each Nare Daily  . gabapentin  100 mg Oral TID  . guaiFENesin  1,200 mg Oral BID  . levothyroxine  175 mcg Oral Daily  . methylPREDNISolone (SOLU-MEDROL) injection  40 mg Intravenous Q6H  . pantoprazole (PROTONIX) IV  40 mg Intravenous Q12H  . polyethylene glycol  17 g Oral BID  . sertraline  100 mg Oral Daily  . simvastatin  20 mg Oral QHS  . tiZANidine  8 mg Oral TID   Continuous Infusions: . meropenem (MERREM) IV 1 g (05/27/20 1317)  . vancomycin 750 mg (05/27/20 0642)   PRN Meds:.acetaminophen **OR** acetaminophen, ALPRAZolam, guaiFENesin-dextromethorphan, HYDROmorphone, ondansetron (ZOFRAN) IV, ondansetron **OR** ondansetron (ZOFRAN) IV, promethazine, traZODone  Antibiotics  :    Anti-infectives (From admission, onward)   Start     Dose/Rate Route Frequency Ordered Stop   05/27/20 0600  vancomycin (VANCOREADY) IVPB 750 mg/150 mL        750 mg 150 mL/hr over 60 Minutes Intravenous Every 12 hours 05/26/20 1220     05/26/20 1300  meropenem (MERREM) 1 g in sodium chloride 0.9 % 100 mL IVPB        1 g 200 mL/hr over 30 Minutes Intravenous Every 8 hours 05/26/20 1218     05/26/20 1300   vancomycin (VANCOREADY) IVPB 1250 mg/250 mL        1,250 mg 166.7 mL/hr over 90 Minutes Intravenous  Once 05/26/20 1220 05/26/20 1743       Uzoma Vivona M.D on 05/27/2020 at 2:04 PM  To page go to [email or web address]   [facility] -  Office  [phone]    Objective:   Vitals:   05/26/20 2102 05/27/20 0547 05/27/20 0806 05/27/20 1149  BP: (!) 92/54 101/70  (!) 99/50  Pulse: 100 (!) 56  62  Resp: 20 19  20   Temp: 98.5 F (36.9 C) 98.1 F (36.7 C)  99.8 F (37.7 C)  TempSrc:  Oral  Oral  SpO2: 99% 100% 97% 98%  Weight:      Height:        Wt Readings from Last 3 Encounters:  05/26/20 71.4 kg  07/29/15 83.9 kg  07/25/15 83.9 kg    No intake or output data in the 24 hours ending 05/27/20 1404   Physical Exam  Awake Alert, Oriented X 3, frail, no new F.N deficits, Normal affect Diminished air entry at the bases, coarse respiratory sounds with rhonchi. RRR,No Gallops,Rubs or new Murmurs, No Parasternal Heave +ve B.Sounds, Abd Soft, No tenderness, No rebound - guarding or rigidity. No Cyanosis, Clubbing or edema, No new Rash or bruise       Data Review:    CBC Recent Labs  Lab 05/25/20 1129 05/25/20 1315 05/25/20 2211 05/26/20 0234 05/27/20 0240  WBC 11.8*  --  8.7 10.2 10.0  HGB 10.8* 10.2* 11.7* 11.2* 10.0*  HCT 35.1* 30.0* 37.5 34.9* 30.8*  PLT 219  --  178 183 170  MCV 92.6  --  91.5 89.7 88.8  MCH 28.5  --  28.5 28.8 28.8  MCHC 30.8  --  31.2 32.1 32.5  RDW 14.8  --  14.6 14.4 14.6  LYMPHSABS 2.1  --   --   --   --   MONOABS 0.8  --   --   --   --  EOSABS 0.5  --   --   --   --   BASOSABS 0.0  --   --   --   --     Recent Labs  Lab 05/25/20 1129 05/25/20 1222 05/25/20 1315 05/25/20 1321 05/25/20 2211 05/26/20 0234 05/27/20 0240  NA 137  --  135  --   --  137 137  K 3.9  --  4.1  --   --  3.6 4.2  CL 100  --   --   --   --  101 104  CO2 28  --   --   --   --  29 26  GLUCOSE 103*  --   --   --   --  189* 148*  BUN 17  --   --    --   --  14 11  CREATININE 0.83  --   --   --  0.78 0.71 0.56  CALCIUM 9.4  --   --   --   --  8.8* 8.8*  AST 17  --   --   --   --  17  --   ALT 16  --   --   --   --  16  --   ALKPHOS 41  --   --   --   --  47  --   BILITOT 0.4  --   --   --   --  0.6  --   ALBUMIN 2.9*  --   --   --   --  3.0*  --   LATICACIDVEN 1.4  --   --  0.9  --   --   --   INR  --  0.9  --   --   --   --   --     ------------------------------------------------------------------------------------------------------------------ No results for input(s): CHOL, HDL, LDLCALC, TRIG, CHOLHDL, LDLDIRECT in the last 72 hours.  Lab Results  Component Value Date   HGBA1C 5.6 10/25/2014   ------------------------------------------------------------------------------------------------------------------ No results for input(s): TSH, T4TOTAL, T3FREE, THYROIDAB in the last 72 hours.  Invalid input(s): FREET3  Cardiac Enzymes No results for input(s): CKMB, TROPONINI, MYOGLOBIN in the last 168 hours.  Invalid input(s): CK ------------------------------------------------------------------------------------------------------------------    Component Value Date/Time   BNP 46.5 12/27/2018 0505    Micro Results Recent Results (from the past 240 hour(s))  Culture, blood (Routine x 2)     Status: None (Preliminary result)   Collection Time: 05/25/20 11:36 AM   Specimen: BLOOD  Result Value Ref Range Status   Specimen Description BLOOD RIGHT ANTECUBITAL  Final   Special Requests   Final    BOTTLES DRAWN AEROBIC ONLY Blood Culture results may not be optimal due to an inadequate volume of blood received in culture bottles   Culture   Final    NO GROWTH 1 DAY Performed at [facility], [address], [place], [place] [address]    Report Status PENDING  Incomplete  Resp Panel by RT-PCR (Flu A&B, Covid) Nasopharyngeal Swab     Status: None   Collection Time: 05/25/20  1:05 PM   Specimen: Nasopharyngeal Swab;  Nasopharyngeal(NP) swabs in vial transport medium  Result Value Ref Range Status   SARS Coronavirus 2 by RT PCR NEGATIVE NEGATIVE Final    Comment: (NOTE) SARS-CoV-2 target nucleic acids are NOT DETECTED.  The SARS-CoV-2 RNA is generally detectable in upper respiratory specimens during the acute phase of infection. The  lowest concentration of SARS-CoV-2 viral copies this assay can detect is 138 copies/mL. A negative result does not preclude SARS-Cov-2 infection and should not be used as the sole basis for treatment or other patient management decisions. A negative result may occur with  improper specimen collection/handling, submission of specimen other than nasopharyngeal swab, presence of viral mutation(s) within the areas targeted by this assay, and inadequate number of viral copies(<138 copies/mL). A negative result must be combined with clinical observations, patient history, and epidemiological information. The expected result is Negative.  Fact Sheet for Patients:  BloggerCourse.com  Fact Sheet for Healthcare Providers:  SeriousBroker.it  This test is no t yet approved or cleared by the [place] FDA and  has been authorized for detection and/or diagnosis of SARS-CoV-2 by FDA under an Emergency Use Authorization (EUA). This EUA will remain  in effect (meaning this test can be used) for the duration of the COVID-19 declaration under Section 564(b)(1) of the Act, 21 U.S.C.section 360bbb-3(b)(1), unless the authorization is terminated  or revoked sooner.       Influenza A by PCR NEGATIVE NEGATIVE Final   Influenza B by PCR NEGATIVE NEGATIVE Final    Comment: (NOTE) The Xpert Xpress SARS-CoV-2/FLU/RSV plus assay is intended as an aid in the diagnosis of influenza from Nasopharyngeal swab specimens and should not be used as a sole basis for treatment. Nasal washings and aspirates are unacceptable for Xpert Xpress  SARS-CoV-2/FLU/RSV testing.  Fact Sheet for Patients: BloggerCourse.com  Fact Sheet for Healthcare Providers: SeriousBroker.it  This test is not yet approved or cleared by the [place] FDA and has been authorized for detection and/or diagnosis of SARS-CoV-2 by FDA under an Emergency Use Authorization (EUA). This EUA will remain in effect (meaning this test can be used) for the duration of the COVID-19 declaration under Section 564(b)(1) of the Act, 21 U.S.C. section 360bbb-3(b)(1), unless the authorization is terminated or revoked.  Performed at [facility], [address], [place], [place] [address]   Culture, blood (Routine x 2)     Status: None (Preliminary result)   Collection Time: 05/25/20  1:06 PM   Specimen: BLOOD RIGHT HAND  Result Value Ref Range Status   Specimen Description BLOOD RIGHT HAND  Final   Special Requests   Final    BOTTLES DRAWN AEROBIC AND ANAEROBIC Blood Culture adequate volume   Culture   Final    NO GROWTH 1 DAY Performed at [facility], [address], [place], [place] [address]    Report Status PENDING  Incomplete    Radiology Reports DG Chest 2 View  Result Date: 05/25/2020 CLINICAL DATA:  Shortness of breath EXAM: CHEST - 2 VIEW COMPARISON:  01/06/2019 FINDINGS: Chronic interstitial opacities. No definite new consolidation or edema. No pleural effusion. No pneumothorax. Cardiomediastinal contours are within normal limits. Residual contrast within the bowel. IVC filter is noted. Decreased osseous mineralization with age-indeterminate mid thoracic compression fractures. IMPRESSION: Interstitial prominence that may reflect chronic interstitial lung disease. No definite acute abnormality. Age-indeterminate mid thoracic compression fractures. Electronically Signed   By: Guadlupe Spanish M.D.   On: 05/25/2020 12:27   CT Angio Chest PE W and/or Wo Contrast  Result Date:  05/25/2020 CLINICAL DATA:  Chest pain or SOB, pleurisy or effusion suspected PE suspected, high prob EXAM: CT ANGIOGRAPHY CHEST WITH CONTRAST TECHNIQUE: Multidetector CT imaging of the chest was performed using the standard protocol during bolus administration of intravenous contrast. Multiplanar CT image reconstructions and MIPs  were obtained to evaluate the vascular anatomy. CONTRAST:  OMNIPAQUE IOHEXOL 350 MG/ML SOLN COMPARISON:  Radiograph earlier today. Chest CT 09/14/2011 reviewed. FINDINGS: Cardiovascular: There are no filling defects within the pulmonary arteries to suggest pulmonary embolus. Dilated main pulmonary artery at 4.2 cm. Mild cardiomegaly. Coronary artery calcifications. Mild aortic tortuosity without dissection or aneurysm. Mild aortic atherosclerosis. Mediastinum/Nodes: Prominent mediastinal nodes including a 10 mm prevascular node, series 6, image 36. There is bilateral hilar adenopathy. Largest lymph node on the right measures 16 mm, largest node on the left measures 10 mm. Thyroid gland not well-defined. Tiny hiatal hernia. Lungs/Pleura: Interstitial lung disease with subpleural reticulation, areas of interspersed ground-glass and septal thickening. Mild bronchiectasis in the right middle lobe. No frank honeycombing. No recent CT to assess for progression or change. There is no pleural fluid. Imaging obtained in expiration which limits assessment of the trachea and bronchi. No pneumothorax. Upper Abdomen: Assessed on concurrent abdominal CT, reported separately. Musculoskeletal: Exaggerated thoracic kyphosis. Compression fractures at T3, T5, and T7 which were assessed on thoracic spine CT last month, no interval change. L1 compression fracture with vertebral augmentation. No acute osseous abnormalities are seen. Review of the MIP images confirms the above findings. IMPRESSION: 1. No pulmonary embolus. 2. Dilated main pulmonary artery consistent with pulmonary arterial hypertension. 3.  Interstitial lung disease with subpleural reticulation, areas of interspersed ground-glass and septal thickening. No frank honeycombing. Consider follow-up high-resolution chest CT on a nonemergent basis for further interstitial lung disease characterization. 4. Mediastinal and bilateral hilar adenopathy, likely reactive. 5. Coronary artery calcifications. 6. Multiple thoracic compression fractures, unchanged from thoracic spine CT last month at [facility]. Aortic Atherosclerosis (ICD10-I70.0). Electronically Signed   By: Narda Rutherford M.D.   On: 05/25/2020 15:39   CT ABDOMEN PELVIS W CONTRAST  Result Date: 05/25/2020 CLINICAL DATA:  Abdominal distension.  Abdominal pain. EXAM: CT ABDOMEN AND PELVIS WITH CONTRAST TECHNIQUE: Multidetector CT imaging of the abdomen and pelvis was performed using the standard protocol following bolus administration of intravenous contrast. Delayed images are performed and associated with concurrent chest CT. CONTRAST:  OMNIPAQUE IOHEXOL 350 MG/ML SOLN COMPARISON:  None. FINDINGS: Lower chest: Assessed on concurrent chest CT, reported separately. Hepatobiliary: No focal liver abnormality is seen. Status post cholecystectomy. No biliary dilatation. Pancreas: Diffuse fatty atrophy. No ductal dilatation or inflammation. Spleen: Normal in size. Scattered calcified granuloma. There are 2 low-density lesions, largest measuring 2.2 cm in the anterior mid aspect. Adrenals/Urinary Tract: No adrenal nodule. No hydronephrosis or perinephric edema. Homogeneous renal enhancement. Absent renal excretion on delayed phase imaging. No renal calculi. Urinary bladder is partially distended without wall thickening. Stomach/Bowel: Unremarkable stomach. No small bowel obstruction or inflammation. High-density material/contrast is seen throughout the colon. There is a large volume of colonic stool, primarily in the right colon. Cecum is located in the deep pelvis. Appendix is not confidently  visualized, no evidence of appendicitis. Transverse, descending, and sigmoid colon are tortuous. There is no colonic wall thickening or pericolonic edema. No significant diverticular disease. Vascular/Lymphatic: IVC filter in place. Patent portal vein. Normal caliber abdominal aorta. No bulky abdominopelvic adenopathy. Reproductive: Status post hysterectomy. No adnexal masses. Other: No ascites or free air. No intra-abdominal fluid collection. No body wall hernia. Musculoskeletal: L1 compression fracture with vertebral augmentation. Posterior fusion L4-S1. There are no acute or suspicious osseous abnormalities. IMPRESSION: 1. Large volume of colonic stool, primarily in the right colon, can be seen with constipation. No bowel obstruction or inflammation. 2. Absent renal excretion  on delayed phase imaging, suggest renal dysfunction. 3. Two low-density splenic lesions, largest measuring 2.2 cm. In the absence of known malignancy, this is likely benign. Aortic Atherosclerosis (ICD10-I70.0). Electronically Signed   By: Narda Rutherford M.D.   On: 05/25/2020 15:32

## 2020-05-27 NOTE — Plan of Care (Signed)
  Problem: Education: Goal: Knowledge of General Education information will improve Description: Including pain rating scale, medication(s)/side effects and non-pharmacologic comfort measures Outcome: Progressing   Problem: Clinical Measurements: Goal: Cardiovascular complication will be avoided Outcome: Progressing   Problem: Nutrition: Goal: Adequate nutrition will be maintained Outcome: Progressing   Problem: Pain Managment: Goal: General experience of comfort will improve Outcome: Progressing

## 2020-05-27 NOTE — Progress Notes (Signed)
Patient refused bipap tonight

## 2020-05-27 NOTE — Evaluation (Signed)
Clinical/Bedside Swallow Evaluation Patient Details  Name: [name] Fitzpatrick MRN: [id number] Date of Birth: [birth year]  Today's Date: 05/27/2020 Time: SLP Start Time (ACUTE ONLY): 0902 SLP Stop Time (ACUTE ONLY): 0930 SLP Time Calculation (min) (ACUTE ONLY): 28 min  Past Medical History:  Past Medical History:  Diagnosis Date  . Acute deep vein thrombosis (DVT) of left femoral vein (HCC)   . Acute on chronic respiratory failure with hypoxia (HCC)   . Anxiety   . ARDS (adult respiratory distress syndrome) (HCC)   . Chronic LBP   . Chronic neck pain   . COPD (chronic obstructive pulmonary disease) (HCC)   . Depression   . DM type 2 (diabetes mellitus, type 2) (HCC)   . Dyspnea 05/25/2020  . Fibromyalgia   . GERD (gastroesophageal reflux disease)   . H/O pulmonary fibrosis   . H/O suicide attempt   . Hashimoto's thyroiditis   . Hypertension   . Hypothyroidism   . IBS (irritable bowel syndrome)    with constipation  . Interstitial pneumonia (HCC)   . Migraine headache   . Osteoarthritis   . Seizure disorder (HCC)   . 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 ABDOMINAL HYSTERECTOMY    . b/l foot surgery --bunions    . BIOPSY  07/29/2015   Procedure: BIOPSY;  Surgeon: Malissa Hippo, MD;  Location: [facility];  Service: Endoscopy;;  Duodenal,  gastric, and esophageal  biopsies  . C-Spine fusion  1991, 2004  . CHOLECYSTECTOMY    . COLONOSCOPY WITH PROPOFOL N/A 07/29/2015   Procedure: COLONOSCOPY WITH PROPOFOL;  Surgeon: Malissa Hippo, MD;  Location: [facility];  Service: Endoscopy;  Laterality: N/A;  8:30  . ESOPHAGEAL DILATION N/A 07/29/2015   Procedure: ESOPHAGEAL DILATION;  Surgeon: Malissa Hippo, MD;  Location: [facility];  Service: Endoscopy;  Laterality: N/A;  . ESOPHAGOGASTRODUODENOSCOPY (EGD) WITH PROPOFOL N/A 07/29/2015   Procedure:  ESOPHAGOGASTRODUODENOSCOPY (EGD) WITH PROPOFOL;  Surgeon: Malissa Hippo, MD;  Location: [facility];  Service: Endoscopy;  Laterality: N/A;  . l carpal tunnel release Right   . l spine fusion x 2    . reconstucted right ankle    . Right knee arthroscopic surgery    . total reconstruction of right ankle and heel     total of three   . VESICOVAGINAL FISTULA CLOSURE W/ TAH  1993   Fibroids no Ca   HPI:  [name] Fitzpatrick is a 65 y.o. female with medical history significant of pulmonary fibrosis, seizure disorder chronic respiratory failure, history of ARDS, chronic obstructive lung disease, fibromyalgia, GERD, hypothyroidism, essential hypertension, history of thyroiditis and diabetes who was just discharged on March 4 from [facility] where she was admitted with acute on chronic respiratory failure with pulmonary fibrosis and community-acquired pneumonia. [name] Fitzpatrick presented to the ED with SOB. CXR 3/9: Interstitial prominence that may reflect chronic interstitial lung disease. No definite acute abnormality. MBS 3/1 at [facility] was negative for aspiration, but revealed transient penetration. A regular texture diet with thin liquids was recommended at that time and SLP services were discontinued.   Assessment / Plan / Recommendation Clinical Impression  [name] Fitzpatrick was seen for bedside swallow evaluation. [name] Fitzpatrick reported that she had a trach in the past and believes that she aspirates. [name] Fitzpatrick's RN reported that the [name] Fitzpatrick has been demonstrating coughing with all p.o.  intake. Oral mechanism exam was WNL. [name] Fitzpatrick's oral phase was [facility], but delayed coughing was intermittently noted with thin liquids and with regular texture solids. [name] Fitzpatrick demonstrated eructation during the evaluation, suggesting possible esophageal involvement. [name] Fitzpatrick does have a baseline cough, and SLP suspects that GERD, and dyspnea may be contributing factors in her current presentation. However, [name] Fitzpatrick reported that she believes that her swallow  function is worse than when she was seen for the last modified barium swallow study. A repeat modified barium swallow study is therefore recommended to re-assess swallow function. SLP Visit Diagnosis: Dysphagia, unspecified (R13.10)    Aspiration Risk  Mild aspiration risk    Diet Recommendation  (continue current diet until MBS completed)   Liquid Administration via: Cup;Straw Medication Administration: Whole meds with liquid Compensations: Slow rate;Small sips/bites Postural Changes: Seated upright at 90 degrees;Remain upright for at least 30 minutes after po intake    Other  Recommendations Oral Care Recommendations: Oral care BID   Follow up Recommendations  (TBD)      Frequency and Duration min 1 x/week  1 week       Prognosis Prognosis for Safe Diet Advancement: Good      Swallow Study   General Date of Onset: 05/26/20 HPI: [name] Fitzpatrick is a 65 y.o. female with medical history significant of pulmonary fibrosis, seizure disorder chronic respiratory failure, history of ARDS, chronic obstructive lung disease, fibromyalgia, GERD, hypothyroidism, essential hypertension, history of thyroiditis and diabetes who was just discharged on March 4 from [facility] was admitted with acute on chronic respiratory failure with pulmonary fibrosis and community-acquired pneumonia. [name] Fitzpatrick presented to the ED with SOB. CXR 3/9: Interstitial prominence that may reflect chronic interstitial lung disease. No definite acute abnormality. MBS 3/1 at [facility] was negative for aspiration, but revealed transient penetration. A regular texture diet with thin liquids was recommended at that time and SLP services were discontinued. Type of Study: Bedside Swallow Evaluation Previous Swallow Assessment: See HPI Diet Prior to this Study: Regular;Thin liquids Temperature Spikes Noted: No Respiratory Status: Nasal cannula History of Recent Intubation: No Behavior/Cognition:  Alert;Cooperative;Pleasant mood Oral Cavity Assessment: Within Functional Limits Oral Care Completed by SLP: No Oral Cavity - Dentition: Dentures, top;Dentures, bottom Vision: Functional for self-feeding Self-Feeding Abilities: Able to feed self Patient Positioning: Upright in bed;Postural control adequate for testing Baseline Vocal Quality: Normal Volitional Cough: Strong Volitional Swallow: Able to elicit    Oral/Motor/Sensory Function Overall Oral Motor/Sensory Function: Within functional limits   Ice Chips Ice chips: Within functional limits Presentation: Spoon   Thin Liquid Thin Liquid: Impaired Presentation: Straw;Cup;Self Fed Pharyngeal  Phase Impairments: Cough - Delayed    Nectar Thick Nectar Thick Liquid: Not tested   Honey Thick Honey Thick Liquid: Not tested   Puree Puree: Within functional limits Presentation: Spoon   Solid     Solid: Impaired Presentation: Self Fed Pharyngeal Phase Impairments: Cough - Delayed     Cashel Bellina I. Vear Clock, MS, CCC-SLP Acute Rehabilitation Services Office number [phone] Pager [phone]  Scheryl Marten 05/27/2020,9:40 AM

## 2020-05-27 NOTE — Progress Notes (Signed)
  Speech Language Pathology Treatment: Dysphagia  Patient Details Name: [name] Fitzpatrick MRN: [id number] DOB: July 15, 1955 Today's Date: 05/27/2020 Time: 5035-4656 SLP Time Calculation (min) (ACUTE ONLY): 17 min  Assessment / Plan / Recommendation Clinical Impression  Pt was seen for dysphagia treatment with her husband present for the majority of the session. Pt and her husband were educated regarding the results of the modified barium swallow study, diet recommendations, and swallowing precautions including the need for use of the chin tuck with right head turn. Video recording of the study was used to facilitate education and both parties verbalized understanding regarding all areas of education. Pt was able to demonstrate use of the chin tuck with right head turn with thin liquids via straw and no s/sx of aspiration were noted. Swallowing precautions sign was posted in room and the importance of these in reducing aspiration risk as well as in avoiding the need for thickened liquids was reinforced. Pt verbalized understanding as well as agreement and reiterated, "I don't want that thickened stuff again." SLP will continue to follow pt.    HPI HPI: Pt is a 65 y.o. female with medical history significant of pulmonary fibrosis, seizure disorder chronic respiratory failure, history of ARDS, chronic obstructive lung disease, fibromyalgia, GERD, hypothyroidism, essential hypertension, history of thyroiditis and diabetes who was just discharged on March 4 from [facility] where she was admitted with acute on chronic respiratory failure with pulmonary fibrosis and community-acquired pneumonia. Pt presented to the ED with SOB. CXR 3/9: Interstitial prominence that may reflect chronic interstitial lung disease. No definite acute abnormality. MBS 3/1 at [facility] was negative for aspiration, but revealed transient penetration secondary reduced laryngeal elevation, reduced  anterior hyoid movement/excursion, and reduced laryngeal vestibule closure. A regular texture diet with thin liquids was recommended at that time and SLP services were discontinued. Esophagram 3/11: No abnormality identified on limited exam. No stricture or mass. No  gastroesophageal reflux demonstrated.      SLP Plan  Continue with current plan of care       Recommendations  Diet recommendations: Regular;Thin liquid Liquids provided via: Cup;Straw Medication Administration: Whole meds with puree Supervision: Patient able to self feed;Intermittent supervision to cue for compensatory strategies Compensations: Slow rate;Small sips/bites;Chin tuck (chin tuck with right head turn) Postural Changes and/or Swallow Maneuvers: Seated upright 90 degrees                Oral Care Recommendations: Oral care BID Follow up Recommendations: Outpatient SLP SLP Visit Diagnosis: Dysphagia, pharyngeal phase (R13.13) Plan: Continue with current plan of care       [name] Fitzpatrick, Alicia, [place] Office number [phone] Pager [phone]               Horton Marshall 05/27/2020, 5:45 PM

## 2020-05-27 NOTE — Plan of Care (Signed)

## 2020-05-28 DIAGNOSIS — J9621 Acute and chronic respiratory failure with hypoxia: Secondary | ICD-10-CM | POA: Diagnosis not present

## 2020-05-28 DIAGNOSIS — J8410 Pulmonary fibrosis, unspecified: Secondary | ICD-10-CM | POA: Diagnosis not present

## 2020-05-28 DIAGNOSIS — J449 Chronic obstructive pulmonary disease, unspecified: Secondary | ICD-10-CM | POA: Diagnosis not present

## 2020-05-28 MED ORDER — PREDNISONE 10 MG PO TABS: 10.0000 mg | ORAL_TABLET | Freq: Every day | ORAL | Status: DC

## 2020-05-28 MED ORDER — PREDNISONE 20 MG PO TABS
50.0000 mg | ORAL_TABLET | Freq: Every day | ORAL | Status: DC
  Administered 2020-05-29 – 2020-05-31 (×3): 50 mg via ORAL
  Filled 2020-05-28 (×3): qty 2

## 2020-05-28 MED ORDER — FUROSEMIDE 10 MG/ML IJ SOLN
20.0000 mg | Freq: Once | INTRAMUSCULAR | Status: AC
  Administered 2020-05-28: 20 mg via INTRAVENOUS
  Filled 2020-05-28: qty 2

## 2020-05-28 NOTE — Plan of Care (Signed)
  Problem: Education: Goal: Knowledge of General Education information will improve Description: Including pain rating scale, medication(s)/side effects and non-pharmacologic comfort measures Outcome: Adequate for Discharge   Problem: Health Behavior/Discharge Planning: Goal: Ability to manage health-related needs will improve Outcome: Progressing   Problem: Clinical Measurements: Goal: Ability to maintain clinical measurements within normal limits will improve Outcome: Progressing Goal: Will remain free from infection Outcome: Progressing Goal: Diagnostic test results will improve Outcome: Progressing Goal: Respiratory complications will improve Outcome: Progressing Goal: Cardiovascular complication will be avoided Outcome: Progressing   Problem: Activity: Goal: Risk for activity intolerance will decrease Outcome: Progressing   Problem: Nutrition: Goal: Adequate nutrition will be maintained Outcome: Progressing   Problem: Coping: Goal: Level of anxiety will decrease Outcome: Progressing   Problem: Elimination: Goal: Will not experience complications related to bowel motility Outcome: Not Progressing Goal: Will not experience complications related to urinary retention Outcome: Not Applicable   Problem: Elimination: Goal: Will not experience complications related to urinary retention Outcome: Not Applicable   Problem: Pain Managment: Goal: General experience of comfort will improve Outcome: Progressing   Problem: Safety: Goal: Ability to remain free from injury will improve Outcome: Progressing   Problem: Skin Integrity: Goal: Risk for impaired skin integrity will decrease Outcome: Progressing

## 2020-05-28 NOTE — Progress Notes (Signed)
PCCM Progress Note  Social visit with patient today and she states she feels slightly improved compared to day prior. She did report significant coughing episode when she attempted to bath herself this AM, this resolved with rest. She reports inadequate pain control since admission. She is chronically on 4mg  of PO Dilaudid q4hrs as needed for chronic back pain and this regiment has not been resumed during this admission.   Given [name] Fitzpatrick's chronic pulmonary issues and significant chronic pain we discussed the benefits of adding palliative care to her care team. She stated that she was recently connected with Palliative care for these same issues but she has not yet had the opportunity to meet with them yet. She would be very interested in meeting with Palliative care during this admission. I will order the consult to Palliative care today  PCCM will see patent again 3/14, please call if we are needed before.   [name] Cancel, NP-C [facility] Personal contact information can be found on Amion  If no response please page: Adult pulmonary and critical care medicine pager on Amion unitl 7pm After 7pm please call [phone] 05/28/2020, 11:10 AM

## 2020-05-28 NOTE — Progress Notes (Signed)
PROGRESS NOTE                                                                             PROGRESS NOTE                                                                                                                                                                                                             Patient Demographics:    [name] Fitzpatrick, is a 65 y.o. female, DOB - Feb 20, 1956, STM:[id number]  Outpatient Primary MD for the patient is Lilian Coma., MD    LOS - 3  Admit date - 05/25/2020    Chief Complaint  Patient presents with  . Shortness of Breath       Brief Narrative    65 y.o. female with medical history significant of pulmonary fibrosis, seizure disorder chronic respiratory failure, history of ARDS, chronic obstructive lung disease, fibromyalgia, GERD, hypothyroidism, essential hypertension, history of thyroiditis and diabetes who was just discharged on March 4 from [facility] where she was admitted with acute on chronic respiratory failure with pulmonary fibrosis and community-acquired pneumonia.  Patient completed treatment and was discharged home.  She was not on any antibiotics.  Patient comes back to ED with worsening shortness of breath and wheezing, she was noted with increased oxygen requirement, she was admitted for further management of acute on chronic hypoxic respiratory failure.   Subjective:    [name] Fitzpatrick today reports dyspnea has improved, she was able to ambulate to the bathroom with some assistance and oxygen, reports cough still present but is improving as well, and able to bring up more phlegm .    Assessment  & Plan :    Principal Problem:   Acute on chronic respiratory failure with hypoxia (HCC) Active Problems:   COPD (chronic obstructive pulmonary disease) (HCC)   Anxiety state   Seizure disorder (HCC)   Interstitial pneumonia (HCC)  Acute on chronic respiratory  failure with hypoxi. -This appears to be multifocal hypoxic respiratory failure in the  pericolonic edema. No significant diverticular disease. Vascular/Lymphatic: IVC filter in place. Patent portal vein. Normal caliber abdominal aorta. No bulky abdominopelvic adenopathy. Reproductive: Status post hysterectomy. No adnexal masses. Other: No ascites or free air. No intra-abdominal fluid collection. No body wall hernia. Musculoskeletal: L1 compression fracture with vertebral augmentation. Posterior fusion L4-S1. There are no acute or suspicious osseous abnormalities. IMPRESSION: 1. Large volume of colonic stool, primarily in the right colon, can be seen with constipation. No bowel obstruction or inflammation. 2. Absent renal excretion on delayed phase imaging, suggest renal dysfunction. 3. Two low-density splenic lesions, largest measuring 2.2 cm. In the absence of known malignancy, this is likely benign. Aortic Atherosclerosis (ICD10-I70.0). Electronically Signed   By: Keith Rake M.D.   On: 05/25/2020 15:32   DG Swallowing Func-Speech Pathology  Result Date: 05/27/2020 Objective Swallowing Evaluation: Type of Study: MBS-Modified Barium Swallow Study  Patient Details Name: [name] Fitzpatrick MRN: [id number] Date of Birth: 1956-01-26 Today's Date: 05/27/2020 Time: SLP Start Time (ACUTE ONLY): 1400 -SLP Stop Time (ACUTE ONLY): 1420 SLP Time Calculation (min) (ACUTE ONLY): 20 min Past Medical History: Past Medical History: Diagnosis  Date . Acute deep vein thrombosis (DVT) of left femoral vein ([place])  . Acute on chronic respiratory failure with hypoxia ([place])  . Anxiety  . ARDS (adult respiratory distress syndrome) ([place])  . Chronic LBP  . Chronic neck pain  . COPD (chronic obstructive pulmonary disease) ([place])  . Depression  . DM type 2 (diabetes mellitus, type 2) ([place])  . Dyspnea 05/25/2020 . Fibromyalgia  . GERD (gastroesophageal reflux disease)  . H/O pulmonary fibrosis  . H/O suicide attempt  . Hashimoto's thyroiditis  . Hypertension  . Hypothyroidism  . IBS (irritable bowel syndrome)   with constipation . Interstitial pneumonia ([place])  . Migraine headache  . Osteoarthritis  . Seizure disorder ([place])  . 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 ABDOMINAL HYSTERECTOMY   . b/l foot surgery --bunions   . BIOPSY  07/29/2015  Procedure: BIOPSY;  Surgeon: Rogene Houston, MD;  [facility];  Service: Endoscopy;;  Duodenal,  gastric, and esophageal  biopsies . C-Spine fusion  1991, 2004 . CHOLECYSTECTOMY   . COLONOSCOPY WITH PROPOFOL N/A 07/29/2015  Procedure: COLONOSCOPY WITH PROPOFOL;  Surgeon: Rogene Houston, MD;  [facility];  Service: Endoscopy;  Laterality: N/A;  8:30 . ESOPHAGEAL DILATION N/A 07/29/2015  Procedure: ESOPHAGEAL DILATION;  Surgeon: Rogene Houston, MD;  [facility];  Service: Endoscopy;  Laterality: N/A; . ESOPHAGOGASTRODUODENOSCOPY (EGD) WITH PROPOFOL N/A 07/29/2015  Procedure: ESOPHAGOGASTRODUODENOSCOPY (EGD) WITH PROPOFOL;  Surgeon: Rogene Houston, MD;  [facility];  Service: Endoscopy;  Laterality: N/A; . l carpal tunnel release Right  . l spine fusion x 2   . reconstucted right ankle   . Right knee arthroscopic surgery   . total reconstruction of right ankle and heel    total of three  . VESICOVAGINAL FISTULA CLOSURE W/ TAH  1993  Fibroids no Ca HPI: Pt is a 65 y.o. female  with medical history significant of pulmonary fibrosis, seizure disorder chronic respiratory failure, history of ARDS, chronic obstructive lung disease, fibromyalgia, GERD, hypothyroidism, essential hypertension, history of thyroiditis and diabetes who was just discharged on March 4 from [facility] where she was admitted with acute on chronic respiratory failure with pulmonary fibrosis and community-acquired pneumonia. Pt presented to the ED  pericolonic edema. No significant diverticular disease. Vascular/Lymphatic: IVC filter in place. Patent portal vein. Normal caliber abdominal aorta. No bulky abdominopelvic adenopathy. Reproductive: Status post hysterectomy. No adnexal masses. Other: No ascites or free air. No intra-abdominal fluid collection. No body wall hernia. Musculoskeletal: L1 compression fracture with vertebral augmentation. Posterior fusion L4-S1. There are no acute or suspicious osseous abnormalities. IMPRESSION: 1. Large volume of colonic stool, primarily in the right colon, can be seen with constipation. No bowel obstruction or inflammation. 2. Absent renal excretion on delayed phase imaging, suggest renal dysfunction. 3. Two low-density splenic lesions, largest measuring 2.2 cm. In the absence of known malignancy, this is likely benign. Aortic Atherosclerosis (ICD10-I70.0). Electronically Signed   By: Keith Rake M.D.   On: 05/25/2020 15:32   DG Swallowing Func-Speech Pathology  Result Date: 05/27/2020 Objective Swallowing Evaluation: Type of Study: MBS-Modified Barium Swallow Study  Patient Details Name: [name] Fitzpatrick MRN: [id number] Date of Birth: 1956-01-26 Today's Date: 05/27/2020 Time: SLP Start Time (ACUTE ONLY): 1400 -SLP Stop Time (ACUTE ONLY): 1420 SLP Time Calculation (min) (ACUTE ONLY): 20 min Past Medical History: Past Medical History: Diagnosis  Date . Acute deep vein thrombosis (DVT) of left femoral vein ([place])  . Acute on chronic respiratory failure with hypoxia ([place])  . Anxiety  . ARDS (adult respiratory distress syndrome) ([place])  . Chronic LBP  . Chronic neck pain  . COPD (chronic obstructive pulmonary disease) ([place])  . Depression  . DM type 2 (diabetes mellitus, type 2) ([place])  . Dyspnea 05/25/2020 . Fibromyalgia  . GERD (gastroesophageal reflux disease)  . H/O pulmonary fibrosis  . H/O suicide attempt  . Hashimoto's thyroiditis  . Hypertension  . Hypothyroidism  . IBS (irritable bowel syndrome)   with constipation . Interstitial pneumonia ([place])  . Migraine headache  . Osteoarthritis  . Seizure disorder ([place])  . 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 ABDOMINAL HYSTERECTOMY   . b/l foot surgery --bunions   . BIOPSY  07/29/2015  Procedure: BIOPSY;  Surgeon: Rogene Houston, MD;  [facility];  Service: Endoscopy;;  Duodenal,  gastric, and esophageal  biopsies . C-Spine fusion  1991, 2004 . CHOLECYSTECTOMY   . COLONOSCOPY WITH PROPOFOL N/A 07/29/2015  Procedure: COLONOSCOPY WITH PROPOFOL;  Surgeon: Rogene Houston, MD;  [facility];  Service: Endoscopy;  Laterality: N/A;  8:30 . ESOPHAGEAL DILATION N/A 07/29/2015  Procedure: ESOPHAGEAL DILATION;  Surgeon: Rogene Houston, MD;  [facility];  Service: Endoscopy;  Laterality: N/A; . ESOPHAGOGASTRODUODENOSCOPY (EGD) WITH PROPOFOL N/A 07/29/2015  Procedure: ESOPHAGOGASTRODUODENOSCOPY (EGD) WITH PROPOFOL;  Surgeon: Rogene Houston, MD;  [facility];  Service: Endoscopy;  Laterality: N/A; . l carpal tunnel release Right  . l spine fusion x 2   . reconstucted right ankle   . Right knee arthroscopic surgery   . total reconstruction of right ankle and heel    total of three  . VESICOVAGINAL FISTULA CLOSURE W/ TAH  1993  Fibroids no Ca HPI: Pt is a 65 y.o. female  with medical history significant of pulmonary fibrosis, seizure disorder chronic respiratory failure, history of ARDS, chronic obstructive lung disease, fibromyalgia, GERD, hypothyroidism, essential hypertension, history of thyroiditis and diabetes who was just discharged on March 4 from [facility] where she was admitted with acute on chronic respiratory failure with pulmonary fibrosis and community-acquired pneumonia. Pt presented to the ED  PROGRESS NOTE                                                                             PROGRESS NOTE                                                                                                                                                                                                             Patient Demographics:    [name] Fitzpatrick, is a 65 y.o. female, DOB - Feb 20, 1956, STM:[id number]  Outpatient Primary MD for the patient is Lilian Coma., MD    LOS - 3  Admit date - 05/25/2020    Chief Complaint  Patient presents with  . Shortness of Breath       Brief Narrative    65 y.o. female with medical history significant of pulmonary fibrosis, seizure disorder chronic respiratory failure, history of ARDS, chronic obstructive lung disease, fibromyalgia, GERD, hypothyroidism, essential hypertension, history of thyroiditis and diabetes who was just discharged on March 4 from [facility] where she was admitted with acute on chronic respiratory failure with pulmonary fibrosis and community-acquired pneumonia.  Patient completed treatment and was discharged home.  She was not on any antibiotics.  Patient comes back to ED with worsening shortness of breath and wheezing, she was noted with increased oxygen requirement, she was admitted for further management of acute on chronic hypoxic respiratory failure.   Subjective:    [name] Fitzpatrick today reports dyspnea has improved, she was able to ambulate to the bathroom with some assistance and oxygen, reports cough still present but is improving as well, and able to bring up more phlegm .    Assessment  & Plan :    Principal Problem:   Acute on chronic respiratory failure with hypoxia (HCC) Active Problems:   COPD (chronic obstructive pulmonary disease) (HCC)   Anxiety state   Seizure disorder (HCC)   Interstitial pneumonia (HCC)  Acute on chronic respiratory  failure with hypoxi. -This appears to be multifocal hypoxic respiratory failure in the  pericolonic edema. No significant diverticular disease. Vascular/Lymphatic: IVC filter in place. Patent portal vein. Normal caliber abdominal aorta. No bulky abdominopelvic adenopathy. Reproductive: Status post hysterectomy. No adnexal masses. Other: No ascites or free air. No intra-abdominal fluid collection. No body wall hernia. Musculoskeletal: L1 compression fracture with vertebral augmentation. Posterior fusion L4-S1. There are no acute or suspicious osseous abnormalities. IMPRESSION: 1. Large volume of colonic stool, primarily in the right colon, can be seen with constipation. No bowel obstruction or inflammation. 2. Absent renal excretion on delayed phase imaging, suggest renal dysfunction. 3. Two low-density splenic lesions, largest measuring 2.2 cm. In the absence of known malignancy, this is likely benign. Aortic Atherosclerosis (ICD10-I70.0). Electronically Signed   By: Keith Rake M.D.   On: 05/25/2020 15:32   DG Swallowing Func-Speech Pathology  Result Date: 05/27/2020 Objective Swallowing Evaluation: Type of Study: MBS-Modified Barium Swallow Study  Patient Details Name: [name] Fitzpatrick MRN: [id number] Date of Birth: 1956-01-26 Today's Date: 05/27/2020 Time: SLP Start Time (ACUTE ONLY): 1400 -SLP Stop Time (ACUTE ONLY): 1420 SLP Time Calculation (min) (ACUTE ONLY): 20 min Past Medical History: Past Medical History: Diagnosis  Date . Acute deep vein thrombosis (DVT) of left femoral vein ([place])  . Acute on chronic respiratory failure with hypoxia ([place])  . Anxiety  . ARDS (adult respiratory distress syndrome) ([place])  . Chronic LBP  . Chronic neck pain  . COPD (chronic obstructive pulmonary disease) ([place])  . Depression  . DM type 2 (diabetes mellitus, type 2) ([place])  . Dyspnea 05/25/2020 . Fibromyalgia  . GERD (gastroesophageal reflux disease)  . H/O pulmonary fibrosis  . H/O suicide attempt  . Hashimoto's thyroiditis  . Hypertension  . Hypothyroidism  . IBS (irritable bowel syndrome)   with constipation . Interstitial pneumonia ([place])  . Migraine headache  . Osteoarthritis  . Seizure disorder ([place])  . 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 ABDOMINAL HYSTERECTOMY   . b/l foot surgery --bunions   . BIOPSY  07/29/2015  Procedure: BIOPSY;  Surgeon: Rogene Houston, MD;  [facility];  Service: Endoscopy;;  Duodenal,  gastric, and esophageal  biopsies . C-Spine fusion  1991, 2004 . CHOLECYSTECTOMY   . COLONOSCOPY WITH PROPOFOL N/A 07/29/2015  Procedure: COLONOSCOPY WITH PROPOFOL;  Surgeon: Rogene Houston, MD;  [facility];  Service: Endoscopy;  Laterality: N/A;  8:30 . ESOPHAGEAL DILATION N/A 07/29/2015  Procedure: ESOPHAGEAL DILATION;  Surgeon: Rogene Houston, MD;  [facility];  Service: Endoscopy;  Laterality: N/A; . ESOPHAGOGASTRODUODENOSCOPY (EGD) WITH PROPOFOL N/A 07/29/2015  Procedure: ESOPHAGOGASTRODUODENOSCOPY (EGD) WITH PROPOFOL;  Surgeon: Rogene Houston, MD;  [facility];  Service: Endoscopy;  Laterality: N/A; . l carpal tunnel release Right  . l spine fusion x 2   . reconstucted right ankle   . Right knee arthroscopic surgery   . total reconstruction of right ankle and heel    total of three  . VESICOVAGINAL FISTULA CLOSURE W/ TAH  1993  Fibroids no Ca HPI: Pt is a 65 y.o. female  with medical history significant of pulmonary fibrosis, seizure disorder chronic respiratory failure, history of ARDS, chronic obstructive lung disease, fibromyalgia, GERD, hypothyroidism, essential hypertension, history of thyroiditis and diabetes who was just discharged on March 4 from [facility] where she was admitted with acute on chronic respiratory failure with pulmonary fibrosis and community-acquired pneumonia. Pt presented to the ED  PROGRESS NOTE                                                                             PROGRESS NOTE                                                                                                                                                                                                             Patient Demographics:    [name] Fitzpatrick, is a 65 y.o. female, DOB - Feb 20, 1956, STM:[id number]  Outpatient Primary MD for the patient is Lilian Coma., MD    LOS - 3  Admit date - 05/25/2020    Chief Complaint  Patient presents with  . Shortness of Breath       Brief Narrative    65 y.o. female with medical history significant of pulmonary fibrosis, seizure disorder chronic respiratory failure, history of ARDS, chronic obstructive lung disease, fibromyalgia, GERD, hypothyroidism, essential hypertension, history of thyroiditis and diabetes who was just discharged on March 4 from [facility] where she was admitted with acute on chronic respiratory failure with pulmonary fibrosis and community-acquired pneumonia.  Patient completed treatment and was discharged home.  She was not on any antibiotics.  Patient comes back to ED with worsening shortness of breath and wheezing, she was noted with increased oxygen requirement, she was admitted for further management of acute on chronic hypoxic respiratory failure.   Subjective:    [name] Fitzpatrick today reports dyspnea has improved, she was able to ambulate to the bathroom with some assistance and oxygen, reports cough still present but is improving as well, and able to bring up more phlegm .    Assessment  & Plan :    Principal Problem:   Acute on chronic respiratory failure with hypoxia (HCC) Active Problems:   COPD (chronic obstructive pulmonary disease) (HCC)   Anxiety state   Seizure disorder (HCC)   Interstitial pneumonia (HCC)  Acute on chronic respiratory  failure with hypoxi. -This appears to be multifocal hypoxic respiratory failure in the  PROGRESS NOTE                                                                             PROGRESS NOTE                                                                                                                                                                                                             Patient Demographics:    [name] Fitzpatrick, is a 65 y.o. female, DOB - Feb 20, 1956, STM:[id number]  Outpatient Primary MD for the patient is Lilian Coma., MD    LOS - 3  Admit date - 05/25/2020    Chief Complaint  Patient presents with  . Shortness of Breath       Brief Narrative    65 y.o. female with medical history significant of pulmonary fibrosis, seizure disorder chronic respiratory failure, history of ARDS, chronic obstructive lung disease, fibromyalgia, GERD, hypothyroidism, essential hypertension, history of thyroiditis and diabetes who was just discharged on March 4 from [facility] where she was admitted with acute on chronic respiratory failure with pulmonary fibrosis and community-acquired pneumonia.  Patient completed treatment and was discharged home.  She was not on any antibiotics.  Patient comes back to ED with worsening shortness of breath and wheezing, she was noted with increased oxygen requirement, she was admitted for further management of acute on chronic hypoxic respiratory failure.   Subjective:    [name] Fitzpatrick today reports dyspnea has improved, she was able to ambulate to the bathroom with some assistance and oxygen, reports cough still present but is improving as well, and able to bring up more phlegm .    Assessment  & Plan :    Principal Problem:   Acute on chronic respiratory failure with hypoxia (HCC) Active Problems:   COPD (chronic obstructive pulmonary disease) (HCC)   Anxiety state   Seizure disorder (HCC)   Interstitial pneumonia (HCC)  Acute on chronic respiratory  failure with hypoxi. -This appears to be multifocal hypoxic respiratory failure in the  pericolonic edema. No significant diverticular disease. Vascular/Lymphatic: IVC filter in place. Patent portal vein. Normal caliber abdominal aorta. No bulky abdominopelvic adenopathy. Reproductive: Status post hysterectomy. No adnexal masses. Other: No ascites or free air. No intra-abdominal fluid collection. No body wall hernia. Musculoskeletal: L1 compression fracture with vertebral augmentation. Posterior fusion L4-S1. There are no acute or suspicious osseous abnormalities. IMPRESSION: 1. Large volume of colonic stool, primarily in the right colon, can be seen with constipation. No bowel obstruction or inflammation. 2. Absent renal excretion on delayed phase imaging, suggest renal dysfunction. 3. Two low-density splenic lesions, largest measuring 2.2 cm. In the absence of known malignancy, this is likely benign. Aortic Atherosclerosis (ICD10-I70.0). Electronically Signed   By: Keith Rake M.D.   On: 05/25/2020 15:32   DG Swallowing Func-Speech Pathology  Result Date: 05/27/2020 Objective Swallowing Evaluation: Type of Study: MBS-Modified Barium Swallow Study  Patient Details Name: [name] Fitzpatrick MRN: [id number] Date of Birth: 1956-01-26 Today's Date: 05/27/2020 Time: SLP Start Time (ACUTE ONLY): 1400 -SLP Stop Time (ACUTE ONLY): 1420 SLP Time Calculation (min) (ACUTE ONLY): 20 min Past Medical History: Past Medical History: Diagnosis  Date . Acute deep vein thrombosis (DVT) of left femoral vein ([place])  . Acute on chronic respiratory failure with hypoxia ([place])  . Anxiety  . ARDS (adult respiratory distress syndrome) ([place])  . Chronic LBP  . Chronic neck pain  . COPD (chronic obstructive pulmonary disease) ([place])  . Depression  . DM type 2 (diabetes mellitus, type 2) ([place])  . Dyspnea 05/25/2020 . Fibromyalgia  . GERD (gastroesophageal reflux disease)  . H/O pulmonary fibrosis  . H/O suicide attempt  . Hashimoto's thyroiditis  . Hypertension  . Hypothyroidism  . IBS (irritable bowel syndrome)   with constipation . Interstitial pneumonia ([place])  . Migraine headache  . Osteoarthritis  . Seizure disorder ([place])  . 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 ABDOMINAL HYSTERECTOMY   . b/l foot surgery --bunions   . BIOPSY  07/29/2015  Procedure: BIOPSY;  Surgeon: Rogene Houston, MD;  [facility];  Service: Endoscopy;;  Duodenal,  gastric, and esophageal  biopsies . C-Spine fusion  1991, 2004 . CHOLECYSTECTOMY   . COLONOSCOPY WITH PROPOFOL N/A 07/29/2015  Procedure: COLONOSCOPY WITH PROPOFOL;  Surgeon: Rogene Houston, MD;  [facility];  Service: Endoscopy;  Laterality: N/A;  8:30 . ESOPHAGEAL DILATION N/A 07/29/2015  Procedure: ESOPHAGEAL DILATION;  Surgeon: Rogene Houston, MD;  [facility];  Service: Endoscopy;  Laterality: N/A; . ESOPHAGOGASTRODUODENOSCOPY (EGD) WITH PROPOFOL N/A 07/29/2015  Procedure: ESOPHAGOGASTRODUODENOSCOPY (EGD) WITH PROPOFOL;  Surgeon: Rogene Houston, MD;  [facility];  Service: Endoscopy;  Laterality: N/A; . l carpal tunnel release Right  . l spine fusion x 2   . reconstucted right ankle   . Right knee arthroscopic surgery   . total reconstruction of right ankle and heel    total of three  . VESICOVAGINAL FISTULA CLOSURE W/ TAH  1993  Fibroids no Ca HPI: Pt is a 65 y.o. female  with medical history significant of pulmonary fibrosis, seizure disorder chronic respiratory failure, history of ARDS, chronic obstructive lung disease, fibromyalgia, GERD, hypothyroidism, essential hypertension, history of thyroiditis and diabetes who was just discharged on March 4 from [facility] where she was admitted with acute on chronic respiratory failure with pulmonary fibrosis and community-acquired pneumonia. Pt presented to the ED  PROGRESS NOTE                                                                             PROGRESS NOTE                                                                                                                                                                                                             Patient Demographics:    [name] Fitzpatrick, is a 65 y.o. female, DOB - Feb 20, 1956, STM:[id number]  Outpatient Primary MD for the patient is Lilian Coma., MD    LOS - 3  Admit date - 05/25/2020    Chief Complaint  Patient presents with  . Shortness of Breath       Brief Narrative    65 y.o. female with medical history significant of pulmonary fibrosis, seizure disorder chronic respiratory failure, history of ARDS, chronic obstructive lung disease, fibromyalgia, GERD, hypothyroidism, essential hypertension, history of thyroiditis and diabetes who was just discharged on March 4 from [facility] where she was admitted with acute on chronic respiratory failure with pulmonary fibrosis and community-acquired pneumonia.  Patient completed treatment and was discharged home.  She was not on any antibiotics.  Patient comes back to ED with worsening shortness of breath and wheezing, she was noted with increased oxygen requirement, she was admitted for further management of acute on chronic hypoxic respiratory failure.   Subjective:    [name] Fitzpatrick today reports dyspnea has improved, she was able to ambulate to the bathroom with some assistance and oxygen, reports cough still present but is improving as well, and able to bring up more phlegm .    Assessment  & Plan :    Principal Problem:   Acute on chronic respiratory failure with hypoxia (HCC) Active Problems:   COPD (chronic obstructive pulmonary disease) (HCC)   Anxiety state   Seizure disorder (HCC)   Interstitial pneumonia (HCC)  Acute on chronic respiratory  failure with hypoxi. -This appears to be multifocal hypoxic respiratory failure in the

## 2020-05-29 DIAGNOSIS — K5900 Constipation, unspecified: Secondary | ICD-10-CM | POA: Diagnosis not present

## 2020-05-29 DIAGNOSIS — J8410 Pulmonary fibrosis, unspecified: Secondary | ICD-10-CM | POA: Diagnosis not present

## 2020-05-29 DIAGNOSIS — J449 Chronic obstructive pulmonary disease, unspecified: Secondary | ICD-10-CM | POA: Diagnosis not present

## 2020-05-29 DIAGNOSIS — F411 Generalized anxiety disorder: Secondary | ICD-10-CM | POA: Diagnosis not present

## 2020-05-29 DIAGNOSIS — J9621 Acute and chronic respiratory failure with hypoxia: Secondary | ICD-10-CM | POA: Diagnosis not present

## 2020-05-29 LAB — CBC
HCT: 32.4 % — ABNORMAL LOW (ref 36.0–46.0)
Hemoglobin: 10.5 g/dL — ABNORMAL LOW (ref 12.0–15.0)
MCH: 28.8 pg (ref 26.0–34.0)
MCHC: 32.4 g/dL (ref 30.0–36.0)
MCV: 89 fL (ref 80.0–100.0)
Platelets: 188 10*3/uL (ref 150–400)
RBC: 3.64 MIL/uL — ABNORMAL LOW (ref 3.87–5.11)
RDW: 14.8 % (ref 11.5–15.5)
WBC: 8.9 10*3/uL (ref 4.0–10.5)
nRBC: 0 % (ref 0.0–0.2)

## 2020-05-29 LAB — BASIC METABOLIC PANEL
Anion gap: 3 — ABNORMAL LOW (ref 5–15)
BUN: 11 mg/dL (ref 8–23)
CO2: 30 mmol/L (ref 22–32)
Calcium: 8.5 mg/dL — ABNORMAL LOW (ref 8.9–10.3)
Chloride: 104 mmol/L (ref 98–111)
Creatinine, Ser: 0.54 mg/dL (ref 0.44–1.00)
GFR, Estimated: >60 mL/min (ref >60)
Glucose, Bld: 97 mg/dL (ref 70–99)
Potassium: 4.2 mmol/L (ref 3.5–5.1)
Sodium: 137 mmol/L (ref 135–145)

## 2020-05-29 LAB — VANCOMYCIN, TROUGH: Vancomycin Tr: 14 ug/mL — ABNORMAL LOW (ref 15–20)

## 2020-05-29 LAB — VANCOMYCIN, PEAK: Vancomycin Pk: 29 ug/mL — ABNORMAL LOW (ref 30–40)

## 2020-05-29 MED ORDER — POLYETHYLENE GLYCOL 3350 17 G PO PACK
17.0000 g | PACK | Freq: Every day | ORAL | Status: DC
  Administered 2020-05-30 – 2020-05-31 (×2): 17 g via ORAL
  Filled 2020-05-29 (×2): qty 1

## 2020-05-29 MED ORDER — LIDOCAINE 5 % EX PTCH
1.0000 | MEDICATED_PATCH | CUTANEOUS | Status: DC
  Administered 2020-05-29 – 2020-05-30 (×2): 1 via TRANSDERMAL
  Filled 2020-05-29 (×2): qty 1

## 2020-05-29 MED ORDER — MORPHINE SULFATE ER 15 MG PO TBCR
30.0000 mg | EXTENDED_RELEASE_TABLET | Freq: Two times a day (BID) | ORAL | Status: DC
  Administered 2020-05-29 – 2020-05-31 (×4): 30 mg via ORAL
  Filled 2020-05-29 (×4): qty 2

## 2020-05-29 NOTE — Progress Notes (Signed)
Pharmacy Antibiotic Note  [name] Fitzpatrick is a 65 y.o. female admitted on 05/25/2020 with pneumonia.  Pharmacy has been consulted for vancomycin and meropenem dosing. Pt is afebrile and WBC is WNL. Scr is WNL. Recent admission for treatment of CAP and IPF flair.   Vancomycin peak and trough ordered for today 3/13 to monitor levels for AUC dosing. Goal AUC 400-600. Levels demonstrate therapeutic AUC of 514.  Plan: Continue vancomycin 750mg  q12h  Height: 5\' 2"  (157.5 cm) Weight: 71.4 kg (157 lb 6.5 oz) IBW/kg (Calculated) : 50.1  Temp (24hrs), Avg:98.3 F (36.8 C), Min:98.1 F (36.7 C), Max:98.4 F (36.9 C)  Recent Labs  Lab 05/25/20 1129 05/25/20 1321 05/25/20 2211 05/26/20 0234 05/27/20 0240 05/29/20 0449 05/29/20 0752 05/29/20 1706  WBC 11.8*  --  8.7 10.2 10.0 8.9  --   --   CREATININE 0.83  --  0.78 0.71 0.56 0.54  --   --   LATICACIDVEN 1.4 0.9  --   --   --   --   --   --   VANCOTROUGH  --   --   --   --   --   --   --  14*  VANCOPEAK  --   --   --   --   --   --  29*  --     Estimated Creatinine Clearance: 65.7 mL/min (by C-G formula based on SCr of 0.54 mg/dL).    Allergies  Allergen Reactions  . Levofloxacin Shortness Of Breath and Itching    WHELPS WHELPS WHELPS WHELPS   . Penicillins Other (See Comments)    Stroke-like symptoms Has patient had a PCN reaction causing immediate rash, facial/tongue/throat swelling, SOB or lightheadedness with hypotension: YES Has patient had a PCN reaction causing severe rash involving mucus membranes or skin necrosis: No Has patient had a PCN reaction that required hospitalization: No Has patient had a PCN reaction occurring within the last 10 years: Yes If all of the above answers are "NO", then may proceed with Cephalosporin use.   . Prednisone Shortness Of Breath and Nausea And Vomiting  . Cephalexin Hives, Swelling and Rash  . Latex Rash and Other (See Comments)    Causes skin to tear easily  . Metoclopramide Hcl  Hives and Rash    Antimicrobials this admission: Vanc 3/10>> Mero 3/10>>  Dose adjustments this admission: N/A  Microbiology results: 3/9 Bcx no growth 3 days 3/9 resp panel neg  Thank you for allowing pharmacy to be a part of this patient's care.  Arrie Senate, PharmD, BCPS, [facility] Clinical Pharmacist [phone] Please check AMION for all [place] numbers 05/29/2020

## 2020-05-29 NOTE — Progress Notes (Signed)
Sputum culture obtained, spontaneous cough.

## 2020-05-29 NOTE — Progress Notes (Signed)
Pharmacy Antibiotic Note  [name] Fitzpatrick is a 65 y.o. female admitted on 05/25/2020 with pneumonia.  Pharmacy has been consulted for vancomycin and meropenem dosing. Pt is afebrile and WBC is WNL. Scr is WNL. Recent admission for treatment of CAP and IPF flair.   Vancomycin peak and trough ordered for today 3/13 to monitor levels for AUC dosing. Goal AUC 400-600.   -vancomycin dose of 750mg  IV 3/13 @0540  -vancomycin peak 3/13 @0752  = 29 -vancomycin trough ordered for 3/13 @1730   Plan: Vancomycin 1250mg  IV x 1 then 750mg  IV Q12H, ordered for 0600 and 1800 Meropenem 1gm IV Q8H F/u trough level and calculated AUC  F/u renal fxn, C&S, clinical status and peak/trough at SS  Height: 5\' 2"  (157.5 cm) Weight: 71.4 kg (157 lb 6.5 oz) IBW/kg (Calculated) : 50.1  Temp (24hrs), Avg:98.1 F (36.7 C), Min:97.8 F (36.6 C), Max:98.4 F (36.9 C)  Recent Labs  Lab 05/25/20 1129 05/25/20 1321 05/25/20 2211 05/26/20 0234 05/27/20 0240 05/29/20 0449 05/29/20 0752  WBC 11.8*  --  8.7 10.2 10.0 8.9  --   CREATININE 0.83  --  0.78 0.71 0.56 0.54  --   LATICACIDVEN 1.4 0.9  --   --   --   --   --   VANCOPEAK  --   --   --   --   --   --  29*    Estimated Creatinine Clearance: 65.7 mL/min (by C-G formula based on SCr of 0.54 mg/dL).    Allergies  Allergen Reactions  . Levofloxacin Shortness Of Breath and Itching    WHELPS WHELPS WHELPS WHELPS   . Penicillins Other (See Comments)    Stroke-like symptoms Has patient had a PCN reaction causing immediate rash, facial/tongue/throat swelling, SOB or lightheadedness with hypotension: YES Has patient had a PCN reaction causing severe rash involving mucus membranes or skin necrosis: No Has patient had a PCN reaction that required hospitalization: No Has patient had a PCN reaction occurring within the last 10 years: Yes If all of the above answers are "NO", then may proceed with Cephalosporin use.   . Prednisone Shortness Of Breath and  Nausea And Vomiting  . Cephalexin Hives, Swelling and Rash  . Latex Rash and Other (See Comments)    Causes skin to tear easily  . Metoclopramide Hcl Hives and Rash    Antimicrobials this admission: Vanc 3/10>> Mero 3/10>>  Dose adjustments this admission: N/A  Microbiology results: 3/9 Bcx no growth 3 days 3/9 resp panel neg  Thank you for allowing pharmacy to be a part of this patient's care.   Wilson Singer, PharmD PGY1 Pharmacy Resident 05/29/2020 9:15 AM
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
## 2020-05-29 NOTE — Progress Notes (Signed)
PROGRESS NOTE                                                                             PROGRESS NOTE                                                                                                                                                                                                             Patient Demographics:    [name] Fitzpatrick, is a 65 y.o. female, DOB - [birth year], GEX:[id number]  Outpatient Primary MD for the patient is Malka So., MD    LOS - 4  Admit date - 05/25/2020    Chief Complaint  Patient presents with  . Shortness of Breath       Brief Narrative    65 y.o. female with medical history significant of pulmonary fibrosis, seizure disorder chronic respiratory failure, history of ARDS, chronic obstructive lung disease, fibromyalgia, GERD, hypothyroidism, essential hypertension, history of thyroiditis and diabetes who was just discharged on March 4 from [facility] health [facility] where she was admitted with acute on chronic respiratory failure with pulmonary fibrosis and community-acquired pneumonia.  Patient completed treatment and was discharged home.  She was not on any antibiotics.  Patient comes back to ED with worsening shortness of breath and wheezing, she was noted with increased oxygen requirement, she was admitted for further management of acute on chronic hypoxic respiratory failure.   Subjective:    [name] Fitzpatrick today ports dyspnea has improved, reports she is compliant with SLP recommendation with diet, cough with eating has significantly subsided, reports dyspnea has improved as well, still reports phlegm with using flutter valve .   Assessment  & Plan :    Principal Problem:   Acute on chronic respiratory failure with hypoxia (HCC) Active Problems:   COPD (chronic obstructive pulmonary disease) (HCC)   Anxiety state   Seizure disorder (HCC)   Interstitial pneumonia (HCC)  Acute on  chronic respiratory failure with hypoxi. -This appears to be multifocal hypoxic respiratory failure in the setting of  recurrent aspiration pneumonia, superimposed on underlying ILD and post ARDS fibrosis. -Pulmonary input greatly appreciated, continue with broad-spectrum antibiotics.  She remains on vancomycin and meropenem given her significant allergy profile -Continue with pulse steroids , steroid management per PCCM.  Gradually being tapered.  Patient p.o. regimen. -Chronic microaspiration felt to be contributing to her symptoms, so SLP consulted, she had MBS and is radiogram done 3/11, no significant finding on esophagogram, patient was educated by SLP about technique to minimize aspiration risk, patient reports he has been compliant with it, and she has not been coughing while drinking or eating currently. -She has been very compliant with her incentive spirometry and flutter valve. -Continue with Protonix twice daily. -Follow strict reflux precautions.  COPD:  -  Continue breathing treatments, she is on pulse steroids  Chronic pain syndrome -Continue home medications  history of DVT: Not on anticoagulation at the moment.    She is currently on DVT prophylaxis dose  Anxiety disorder:  Patient reports she is not taking Klonopin, continue with Xanax.  GERD: Continue PPIs     SpO2: 98 % O2 Flow Rate (L/min): 4 L/min FiO2 (%): 32 %  Recent Labs  Lab 05/25/20 1129 05/25/20 1222 05/25/20 1305 05/25/20 1321 05/25/20 2211 05/26/20 0234 05/27/20 0240 05/29/20 0449  WBC 11.8*  --   --   --  8.7 10.2 10.0 8.9  PLT 219  --   --   --  178 183 170 188  AST 17  --   --   --   --  17  --   --   ALT 16  --   --   --   --  16  --   --   ALKPHOS 41  --   --   --   --  47  --   --   BILITOT 0.4  --   --   --   --  0.6  --   --   ALBUMIN 2.9*  --   --   --   --  3.0*  --   --   INR  --  0.9  --   --   --   --   --   --   LATICACIDVEN 1.4  --   --  0.9  --   --   --   --    SARSCOV2NAA  --   --  NEGATIVE  --   --   --   --   --     FiO2 (%):  [32 %-40 %] 32 %  ABG     Component Value Date/Time   PHART 7.410 12/25/2018 1230   PCO2ART 54.7 (H) 12/25/2018 1230   PO2ART 110 (H) 12/25/2018 1230   HCO3 30.8 (H) 05/25/2020 1315   TCO2 32 05/25/2020 1315   ACIDBASEDEF 0.1 01/30/2010 2033   O2SAT 97.0 05/25/2020 1315      Condition - Extremely Guarded  Family Communication  :  None at bedside  Code Status :  Full  Consults  :  PCCM  Procedures  :  none  PUD Prophylaxis : PPI  Disposition Plan  :    Status is: Inpatient  Remains inpatient appropriate because:IV treatments appropriate due to intensity of illness or inability to take PO   Dispo: The patient is from: Home              Anticipated d/c is to: Home  Patient currently is not medically stable to d/c.   Difficult to place patient No      DVT Prophylaxis  :  Lovenox   Lab Results  Component Value Date   PLT 188 05/29/2020    Diet :  Diet Order            Diet Heart Room service appropriate? Yes; Fluid consistency: Thin  Diet effective now                  Inpatient Medications  Scheduled Meds: . albuterol  2.5 mg Nebulization Q6H  . aspirin EC  81 mg Oral Daily  . enoxaparin (LOVENOX) injection  40 mg Subcutaneous Q24H  . fluticasone  2 spray Each Nare Daily  . gabapentin  100 mg Oral TID  . guaiFENesin  1,200 mg Oral BID  . levothyroxine  175 mcg Oral Daily  . pantoprazole (PROTONIX) IV  40 mg Intravenous Q12H  . predniSONE  50 mg Oral Q breakfast  . sertraline  100 mg Oral Daily  . simvastatin  20 mg Oral QHS  . tiZANidine  8 mg Oral TID   Continuous Infusions: . meropenem (MERREM) IV 1 g (05/29/20 0447)  . vancomycin 750 mg (05/29/20 0540)   PRN Meds:.acetaminophen **OR** acetaminophen, ALPRAZolam, guaiFENesin-dextromethorphan, HYDROmorphone, ondansetron (ZOFRAN) IV, ondansetron **OR** ondansetron (ZOFRAN) IV, promethazine,  traZODone  Antibiotics  :    Anti-infectives (From admission, onward)   Start     Dose/Rate Route Frequency Ordered Stop   05/27/20 0600  vancomycin (VANCOREADY) IVPB 750 mg/150 mL        750 mg 150 mL/hr over 60 Minutes Intravenous Every 12 hours 05/26/20 1220     05/26/20 1300  meropenem (MERREM) 1 g in sodium chloride 0.9 % 100 mL IVPB        1 g 200 mL/hr over 30 Minutes Intravenous Every 8 hours 05/26/20 1218     05/26/20 1300  vancomycin (VANCOREADY) IVPB 1250 mg/250 mL        1,250 mg 166.7 mL/hr over 90 Minutes Intravenous  Once 05/26/20 1220 05/26/20 1743       Ervin Rothbauer M.D on 05/29/2020 at 1:51 PM  To page go to [email or web address]   [facility] -  Office  [phone]    Objective:   Vitals:   05/29/20 0408 05/29/20 0518 05/29/20 0907 05/29/20 1255  BP:  (!) 98/56  111/68  Pulse: 75 65  71  Resp: 15 20  20   Temp:  98.4 F (36.9 C)    TempSrc:  Oral    SpO2: 99% 97% 100% 98%  Weight:      Height:        Wt Readings from Last 3 Encounters:  05/26/20 71.4 kg  07/29/15 83.9 kg  07/25/15 83.9 kg     Intake/Output Summary (Last 24 hours) at 05/29/2020 1351 Last data filed at 05/29/2020 0500 Gross per 24 hour  Intake --  Output 750 ml  Net -750 ml     Physical Exam  Awake Alert, Oriented X 3, No new F.N deficits, Normal affect Symmetrical Chest wall movement, diminished air entry at the bases, with scattered rales. RRR,No Gallops,Rubs or new Murmurs, No Parasternal Heave +ve B.Sounds, Abd Soft, No tenderness, No rebound - guarding or rigidity. No Cyanosis, Clubbing or edema, No new Rash or bruise       Data Review:    CBC Recent Labs  Lab 05/25/20 1129 05/25/20 1315 05/25/20 2211 05/26/20 0234 05/27/20 0240  05/29/20 0449  WBC 11.8*  --  8.7 10.2 10.0 8.9  HGB 10.8* 10.2* 11.7* 11.2* 10.0* 10.5*  HCT 35.1* 30.0* 37.5 34.9* 30.8* 32.4*  PLT 219  --  178 183 170 188  MCV 92.6  --  91.5 89.7 88.8 89.0  MCH 28.5  --  28.5 28.8  28.8 28.8  MCHC 30.8  --  31.2 32.1 32.5 32.4  RDW 14.8  --  14.6 14.4 14.6 14.8  LYMPHSABS 2.1  --   --   --   --   --   MONOABS 0.8  --   --   --   --   --   EOSABS 0.5  --   --   --   --   --   BASOSABS 0.0  --   --   --   --   --     Recent Labs  Lab 05/25/20 1129 05/25/20 1222 05/25/20 1315 05/25/20 1321 05/25/20 2211 05/26/20 0234 05/27/20 0240 05/29/20 0449  NA 137  --  135  --   --  137 137 137  K 3.9  --  4.1  --   --  3.6 4.2 4.2  CL 100  --   --   --   --  101 104 104  CO2 28  --   --   --   --  29 26 30   GLUCOSE 103*  --   --   --   --  189* 148* 97  BUN 17  --   --   --   --  14 11 11   CREATININE 0.83  --   --   --  0.78 0.71 0.56 0.54  CALCIUM 9.4  --   --   --   --  8.8* 8.8* 8.5*  AST 17  --   --   --   --  17  --   --   ALT 16  --   --   --   --  16  --   --   ALKPHOS 41  --   --   --   --  47  --   --   BILITOT 0.4  --   --   --   --  0.6  --   --   ALBUMIN 2.9*  --   --   --   --  3.0*  --   --   LATICACIDVEN 1.4  --   --  0.9  --   --   --   --   INR  --  0.9  --   --   --   --   --   --     ------------------------------------------------------------------------------------------------------------------ No results for input(s): CHOL, HDL, LDLCALC, TRIG, CHOLHDL, LDLDIRECT in the last 72 hours.  Lab Results  Component Value Date   HGBA1C 5.6 10/25/2014   ------------------------------------------------------------------------------------------------------------------ No results for input(s): TSH, T4TOTAL, T3FREE, THYROIDAB in the last 72 hours.  Invalid input(s): FREET3  Cardiac Enzymes No results for input(s): CKMB, TROPONINI, MYOGLOBIN in the last 168 hours.  Invalid input(s): CK ------------------------------------------------------------------------------------------------------------------    Component Value Date/Time   BNP 46.5 12/27/2018 0505    Micro Results Recent Results (from the past 240 hour(s))  Culture, blood (Routine x 2)      Status: None (Preliminary result)   Collection Time: 05/25/20 11:36 AM   Specimen: BLOOD  Result Value Ref Range Status   Specimen Description BLOOD  RIGHT ANTECUBITAL  Final   Special Requests   Final    BOTTLES DRAWN AEROBIC ONLY Blood Culture results may not be optimal due to an inadequate volume of blood received in culture bottles   Culture   Final    NO GROWTH 3 DAYS Performed at [facility], [address] [place], [place] [address]    Report Status PENDING  Incomplete  Resp Panel by RT-PCR (Flu A&B, Covid) Nasopharyngeal Swab     Status: None   Collection Time: 05/25/20  1:05 PM   Specimen: Nasopharyngeal Swab; Nasopharyngeal(NP) swabs in vial transport medium  Result Value Ref Range Status   SARS Coronavirus 2 by RT PCR NEGATIVE NEGATIVE Final    Comment: (NOTE) SARS-CoV-2 target nucleic acids are NOT DETECTED.  The SARS-CoV-2 RNA is generally detectable in upper respiratory specimens during the acute phase of infection. The lowest concentration of SARS-CoV-2 viral copies this assay can detect is 138 copies/mL. A negative result does not preclude SARS-Cov-2 infection and should not be used as the sole basis for treatment or other patient management decisions. A negative result may occur with  improper specimen collection/handling, submission of specimen other than nasopharyngeal swab, presence of viral mutation(s) within the areas targeted by this assay, and inadequate number of viral copies(<138 copies/mL). A negative result must be combined with clinical observations, patient history, and epidemiological information. The expected result is Negative.  Fact Sheet for Patients:  BloggerCourse.com  Fact Sheet for Healthcare Providers:  SeriousBroker.it  This test is no t yet approved or cleared by the [place] FDA and  has been authorized for detection and/or diagnosis of SARS-CoV-2 by FDA under an  Emergency Use Authorization (EUA). This EUA will remain  in effect (meaning this test can be used) for the duration of the COVID-19 declaration under Section 564(b)(1) of the Act, 21 U.S.C.section 360bbb-3(b)(1), unless the authorization is terminated  or revoked sooner.       Influenza A by PCR NEGATIVE NEGATIVE Final   Influenza B by PCR NEGATIVE NEGATIVE Final    Comment: (NOTE) The Xpert Xpress SARS-CoV-2/FLU/RSV plus assay is intended as an aid in the diagnosis of influenza from Nasopharyngeal swab specimens and should not be used as a sole basis for treatment. Nasal washings and aspirates are unacceptable for Xpert Xpress SARS-CoV-2/FLU/RSV testing.  Fact Sheet for Patients: BloggerCourse.com  Fact Sheet for Healthcare Providers: SeriousBroker.it  This test is not yet approved or cleared by the [place] FDA and has been authorized for detection and/or diagnosis of SARS-CoV-2 by FDA under an Emergency Use Authorization (EUA). This EUA will remain in effect (meaning this test can be used) for the duration of the COVID-19 declaration under Section 564(b)(1) of the Act, 21 U.S.C. section 360bbb-3(b)(1), unless the authorization is terminated or revoked.  Performed at [facility], [address], [place], [place] [address]   Culture, blood (Routine x 2)     Status: None (Preliminary result)   Collection Time: 05/25/20  1:06 PM   Specimen: BLOOD RIGHT HAND  Result Value Ref Range Status   Specimen Description BLOOD RIGHT HAND  Final   Special Requests   Final    BOTTLES DRAWN AEROBIC AND ANAEROBIC Blood Culture adequate volume   Culture   Final    NO GROWTH 3 DAYS Performed at [facility], [address], [place], [place] [address]    Report Status PENDING  Incomplete    Radiology Reports DG Chest 2  View  Result Date: 05/25/2020 CLINICAL DATA:  Shortness of breath EXAM: CHEST - 2 VIEW COMPARISON:   01/06/2019 FINDINGS: Chronic interstitial opacities. No definite new consolidation or edema. No pleural effusion. No pneumothorax. Cardiomediastinal contours are within normal limits. Residual contrast within the bowel. IVC filter is noted. Decreased osseous mineralization with age-indeterminate mid thoracic compression fractures. IMPRESSION: Interstitial prominence that may reflect chronic interstitial lung disease. No definite acute abnormality. Age-indeterminate mid thoracic compression fractures. Electronically Signed   By: Guadlupe Spanish M.D.   On: 05/25/2020 12:27   CT Angio Chest PE W and/or Wo Contrast  Result Date: 05/25/2020 CLINICAL DATA:  Chest pain or SOB, pleurisy or effusion suspected PE suspected, high prob EXAM: CT ANGIOGRAPHY CHEST WITH CONTRAST TECHNIQUE: Multidetector CT imaging of the chest was performed using the standard protocol during bolus administration of intravenous contrast. Multiplanar CT image reconstructions and MIPs were obtained to evaluate the vascular anatomy. CONTRAST:  OMNIPAQUE IOHEXOL 350 MG/ML SOLN COMPARISON:  Radiograph earlier today. Chest CT 09/14/2011 reviewed. FINDINGS: Cardiovascular: There are no filling defects within the pulmonary arteries to suggest pulmonary embolus. Dilated main pulmonary artery at 4.2 cm. Mild cardiomegaly. Coronary artery calcifications. Mild aortic tortuosity without dissection or aneurysm. Mild aortic atherosclerosis. Mediastinum/Nodes: Prominent mediastinal nodes including a 10 mm prevascular node, series 6, image 36. There is bilateral hilar adenopathy. Largest lymph node on the right measures 16 mm, largest node on the left measures 10 mm. Thyroid gland not well-defined. Tiny hiatal hernia. Lungs/Pleura: Interstitial lung disease with subpleural reticulation, areas of interspersed ground-glass and septal thickening. Mild bronchiectasis in the right middle lobe. No frank honeycombing. No recent CT to assess for progression or  change. There is no pleural fluid. Imaging obtained in expiration which limits assessment of the trachea and bronchi. No pneumothorax. Upper Abdomen: Assessed on concurrent abdominal CT, reported separately. Musculoskeletal: Exaggerated thoracic kyphosis. Compression fractures at T3, T5, and T7 which were assessed on thoracic spine CT last month, no interval change. L1 compression fracture with vertebral augmentation. No acute osseous abnormalities are seen. Review of the MIP images confirms the above findings. IMPRESSION: 1. No pulmonary embolus. 2. Dilated main pulmonary artery consistent with pulmonary arterial hypertension. 3. Interstitial lung disease with subpleural reticulation, areas of interspersed ground-glass and septal thickening. No frank honeycombing. Consider follow-up high-resolution chest CT on a nonemergent basis for further interstitial lung disease characterization. 4. Mediastinal and bilateral hilar adenopathy, likely reactive. 5. Coronary artery calcifications. 6. Multiple thoracic compression fractures, unchanged from thoracic spine CT last month at [facility]. Aortic Atherosclerosis (ICD10-I70.0). Electronically Signed   By: Narda Rutherford M.D.   On: 05/25/2020 15:39   CT ABDOMEN PELVIS W CONTRAST  Result Date: 05/25/2020 CLINICAL DATA:  Abdominal distension.  Abdominal pain. EXAM: CT ABDOMEN AND PELVIS WITH CONTRAST TECHNIQUE: Multidetector CT imaging of the abdomen and pelvis was performed using the standard protocol following bolus administration of intravenous contrast. Delayed images are performed and associated with concurrent chest CT. CONTRAST:  OMNIPAQUE IOHEXOL 350 MG/ML SOLN COMPARISON:  None. FINDINGS: Lower chest: Assessed on concurrent chest CT, reported separately. Hepatobiliary: No focal liver abnormality is seen. Status post cholecystectomy. No biliary dilatation. Pancreas: Diffuse fatty atrophy. No ductal dilatation or inflammation. Spleen: Normal in size.  Scattered calcified granuloma. There are 2 low-density lesions, largest measuring 2.2 cm in the anterior mid aspect. Adrenals/Urinary Tract: No adrenal nodule. No hydronephrosis or perinephric edema. Homogeneous renal enhancement. Absent renal excretion on delayed phase imaging. No renal calculi. Urinary bladder  is partially distended without wall thickening. Stomach/Bowel: Unremarkable stomach. No small bowel obstruction or inflammation. High-density material/contrast is seen throughout the colon. There is a large volume of colonic stool, primarily in the right colon. Cecum is located in the deep pelvis. Appendix is not confidently visualized, no evidence of appendicitis. Transverse, descending, and sigmoid colon are tortuous. There is no colonic wall thickening or pericolonic edema. No significant diverticular disease. Vascular/Lymphatic: IVC filter in place. Patent portal vein. Normal caliber abdominal aorta. No bulky abdominopelvic adenopathy. Reproductive: Status post hysterectomy. No adnexal masses. Other: No ascites or free air. No intra-abdominal fluid collection. No body wall hernia. Musculoskeletal: L1 compression fracture with vertebral augmentation. Posterior fusion L4-S1. There are no acute or suspicious osseous abnormalities. IMPRESSION: 1. Large volume of colonic stool, primarily in the right colon, can be seen with constipation. No bowel obstruction or inflammation. 2. Absent renal excretion on delayed phase imaging, suggest renal dysfunction. 3. Two low-density splenic lesions, largest measuring 2.2 cm. In the absence of known malignancy, this is likely benign. Aortic Atherosclerosis (ICD10-I70.0). Electronically Signed   By: Narda Rutherford M.D.   On: 05/25/2020 15:32   DG Swallowing Func-Speech Pathology  Result Date: 05/27/2020 Objective Swallowing Evaluation: Type of Study: MBS-Modified Barium Swallow Study  Patient Details Name: [name] Fitzpatrick MRN: [id number] Date of Birth: 1956-02-05  Today's Date: 05/27/2020 Time: SLP Start Time (ACUTE ONLY): 1400 -SLP Stop Time (ACUTE ONLY): 1420 SLP Time Calculation (min) (ACUTE ONLY): 20 min Past Medical History: Past Medical History: Diagnosis Date . Acute deep vein thrombosis (DVT) of left femoral vein (HCC)  . Acute on chronic respiratory failure with hypoxia (HCC)  . Anxiety  . ARDS (adult respiratory distress syndrome) (HCC)  . Chronic LBP  . Chronic neck pain  . COPD (chronic obstructive pulmonary disease) (HCC)  . Depression  . DM type 2 (diabetes mellitus, type 2) (HCC)  . Dyspnea 05/25/2020 . Fibromyalgia  . GERD (gastroesophageal reflux disease)  . H/O pulmonary fibrosis  . H/O suicide attempt  . Hashimoto's thyroiditis  . Hypertension  . Hypothyroidism  . IBS (irritable bowel syndrome)   with constipation . Interstitial pneumonia (HCC)  . Migraine headache  . Osteoarthritis  . Seizure disorder (HCC)  . 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 ABDOMINAL HYSTERECTOMY   . b/l foot surgery --bunions   . BIOPSY  07/29/2015  Procedure: BIOPSY;  Surgeon: Malissa Hippo, MD;  [facility]: [facility];  Service: Endoscopy;;  Duodenal,  gastric, and esophageal  biopsies . C-Spine fusion  1991, 2004 . CHOLECYSTECTOMY   . COLONOSCOPY WITH PROPOFOL N/A 07/29/2015  Procedure: COLONOSCOPY WITH PROPOFOL;  Surgeon: Malissa Hippo, MD;  Location: [facility];  Service: Endoscopy;  Laterality: N/A;  8:30 . ESOPHAGEAL DILATION N/A 07/29/2015  Procedure: ESOPHAGEAL DILATION;  Surgeon: Malissa Hippo, MD;  Location: [facility];  Service: Endoscopy;  Laterality: N/A; . ESOPHAGOGASTRODUODENOSCOPY (EGD) WITH PROPOFOL N/A 07/29/2015  Procedure: ESOPHAGOGASTRODUODENOSCOPY (EGD) WITH PROPOFOL;  Surgeon: Malissa Hippo, MD;  Location: [facility];  Service: Endoscopy;  Laterality: N/A; . l carpal tunnel release Right  . l spine fusion x 2   . reconstucted right  ankle   . Right knee arthroscopic surgery   . total reconstruction of right ankle and heel    total of three  . VESICOVAGINAL FISTULA CLOSURE W/ TAH  1993  Fibroids no Ca HPI: Pt is a 65  y.o. female with medical history significant of pulmonary fibrosis, seizure disorder chronic respiratory failure, history of ARDS, chronic obstructive lung disease, fibromyalgia, GERD, hypothyroidism, essential hypertension, history of thyroiditis and diabetes who was just discharged on March 4 from [facility] health [facility] where she was admitted with acute on chronic respiratory failure with pulmonary fibrosis and community-acquired pneumonia. Pt presented to the ED with SOB. CXR 3/9: Interstitial prominence that may reflect chronic interstitial lung disease. No definite acute abnormality. MBS 3/1 at [facility] was negative for aspiration, but revealed transient penetration secondary reduced laryngeal elevation, reduced anterior hyoid movement/excursion, and reduced laryngeal vestibule closure. A regular texture diet with thin liquids was recommended at that time and SLP services were discontinued. Esophagram 3/11: No abnormality identified on limited exam. No stricture or mass. No  gastroesophageal reflux demonstrated.  No data recorded Assessment / Plan / Recommendation CHL IP CLINICAL IMPRESSIONS 05/27/2020 Clinical Impression Pt's swallow function appears similar to that which was noted at [facility] on 3/1 with pharyngeal dysphagia characterized by reduced anterior laryngeal movement, reduced hyolaryngeal elevation, and reduced laryngeal closure secondary to incomplete epiglottic inversion. However, laryngeal invasion has worsed from PAS 2 to PAS 3 and it is anticipated that consistent aspiration would have been an eventuality if compensatory coughing was not intermittently prompted by the SLP. Coughing was also occasionally noted between swallows and it is possible that some instances of penetration did  result in aspiration. No functional benefit was noted with a chin tuck posture or with a chin tuck posture which was paired with left head turn. However, a chin tuck posture with right head turn eliminated penetration. No instances of penetration or aspiration were noted with nectar thick liquids. 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 It is recommended that the pt's current diet be continued with strict observance of swallowing precautions. Pt has been on thickened liquids in the past and has indicated that she would rather use postural modifications than have nectar thick liquids. SLP will follow to assess diet tolerance and to determine need to diet modification pending compliance with swallowing precautions. SLP Visit Diagnosis Dysphagia, pharyngeal phase (R13.13) Attention and concentration deficit following -- Frontal lobe and executive function deficit following -- Impact on safety and function Mild aspiration risk   CHL IP TREATMENT RECOMMENDATION 05/27/2020 Treatment Recommendations Therapy as outlined in treatment plan below   Prognosis 05/27/2020 Prognosis for Safe Diet Advancement Good Barriers to Reach Goals -- Barriers/Prognosis Comment -- [facility] IP DIET RECOMMENDATION 05/27/2020 SLP Diet Recommendations Regular solids;Thin liquid Liquid Administration via Cup;Straw Medication Administration Whole meds with puree Compensations Slow rate;Small sips/bites;Chin tuck Postural Changes Seated upright at 90 degrees;Remain semi-upright after after feeds/meals (Comment)   No flowsheet data found.  [facility] IP FOLLOW UP RECOMMENDATIONS 05/27/2020 Follow up Recommendations Outpatient SLP   [facility] IP FREQUENCY AND DURATION 05/27/2020 Speech Therapy Frequency (ACUTE ONLY) min 2x/week Treatment Duration 2 weeks      [facility] IP ORAL PHASE 05/27/2020 Oral Phase WFL Oral - Pudding Teaspoon -- Oral - Pudding Cup -- Oral - Honey Teaspoon -- Oral - Honey Cup -- Oral - Nectar Teaspoon -- Oral - Nectar Cup -- Oral - Nectar Straw  -- Oral - Thin Teaspoon -- Oral - Thin Cup -- Oral - Thin Straw -- Oral - Puree -- Oral - Mech Soft -- Oral - Regular -- Oral - Multi-Consistency -- Oral - Pill -- Oral Phase - Comment --  [facility] IP PHARYNGEAL PHASE 05/27/2020 Pharyngeal Phase Impaired Pharyngeal- Pudding Teaspoon --  Pharyngeal -- Pharyngeal- Pudding Cup -- Pharyngeal -- Pharyngeal- Honey Teaspoon -- Pharyngeal -- Pharyngeal- Honey Cup -- Pharyngeal -- Pharyngeal- Nectar Teaspoon -- Pharyngeal -- Pharyngeal- Nectar Cup Reduced anterior laryngeal mobility;Reduced epiglottic inversion;Reduced laryngeal elevation;Reduced airway/laryngeal closure Pharyngeal -- Pharyngeal- Nectar Straw Reduced anterior laryngeal mobility;Reduced epiglottic inversion;Reduced laryngeal elevation;Reduced airway/laryngeal closure Pharyngeal -- Pharyngeal- Thin Teaspoon -- Pharyngeal -- Pharyngeal- Thin Cup Reduced anterior laryngeal mobility;Reduced epiglottic inversion;Reduced laryngeal elevation;Reduced airway/laryngeal closure;Penetration/Aspiration during swallow;Penetration/Apiration after swallow Pharyngeal Material enters airway, remains ABOVE vocal cords and not ejected out;Material enters airway, passes BELOW cords and not ejected out despite cough attempt by patient Pharyngeal- Thin Straw Reduced anterior laryngeal mobility;Reduced epiglottic inversion;Reduced laryngeal elevation;Reduced airway/laryngeal closure Pharyngeal -- Pharyngeal- Puree Reduced anterior laryngeal mobility;Reduced epiglottic inversion;Reduced laryngeal elevation;Reduced airway/laryngeal closure Pharyngeal -- Pharyngeal- Mechanical Soft -- Pharyngeal -- Pharyngeal- Regular Reduced anterior laryngeal mobility;Reduced epiglottic inversion;Reduced laryngeal elevation;Reduced airway/laryngeal closure Pharyngeal -- Pharyngeal- Multi-consistency -- Pharyngeal -- Pharyngeal- Pill Reduced anterior laryngeal mobility;Reduced epiglottic inversion;Reduced laryngeal elevation;Reduced airway/laryngeal closure  Pharyngeal -- Pharyngeal Comment --  [facility] IP CERVICAL ESOPHAGEAL PHASE 05/27/2020 Cervical Esophageal Phase Impaired Pudding Teaspoon -- Pudding Cup -- Honey Teaspoon -- Honey Cup -- Nectar Teaspoon -- Nectar Cup -- Nectar Straw -- Thin Teaspoon -- Thin Cup Prominent cricopharyngeal segment Thin Straw Prominent cricopharyngeal segment Puree Prominent cricopharyngeal segment Mechanical Soft -- Regular Prominent cricopharyngeal segment Multi-consistency -- Pill Prominent cricopharyngeal segment Cervical Esophageal Comment -- Shanika I. Vear Clock, MS, CCC-SLP Acute Rehabilitation Services Office number [phone] Pager [phone] Scheryl Marten 05/27/2020, 3:50 PM              DG ESOPHAGUS W SINGLE CM (SOL OR THIN BA)  Result Date: 05/27/2020 CLINICAL DATA:  Difficulty swallowing foods EXAM: ESOPHOGRAM/BARIUM SWALLOW TECHNIQUE: Single contrast examination was performed using thin barium or water soluble. FLUOROSCOPY TIME:  Fluoroscopy Time:  54 seconds Radiation Exposure Index (if provided by the fluoroscopic device): 10.4 mGy Number of Acquired Spot Images: 1 COMPARISON:  None. FINDINGS: Limited exam due to patient immobility. Exam performed and semi supine positioning (approximately 30 degrees off horizontal). Normal swallowing function in the high cervical esophagus. No stricture or mass in the thoracic esophagus or distal esophagus. There is some to and fro motion of the barium bolus which may be in part due to reclined positioning. No gastroesophageal reflux reflux demonstrated. IMPRESSION: No abnormality identified on limited exam. No stricture or mass. No gastroesophageal reflux demonstrated. Electronically Signed   By: Genevive Bi M.D.   On: 05/27/2020 14:17

## 2020-05-30 DIAGNOSIS — R1310 Dysphagia, unspecified: Secondary | ICD-10-CM | POA: Diagnosis not present

## 2020-05-30 DIAGNOSIS — Z515 Encounter for palliative care: Secondary | ICD-10-CM | POA: Diagnosis not present

## 2020-05-30 DIAGNOSIS — G8929 Other chronic pain: Secondary | ICD-10-CM

## 2020-05-30 DIAGNOSIS — J8410 Pulmonary fibrosis, unspecified: Secondary | ICD-10-CM | POA: Diagnosis not present

## 2020-05-30 DIAGNOSIS — J9621 Acute and chronic respiratory failure with hypoxia: Secondary | ICD-10-CM | POA: Diagnosis not present

## 2020-05-30 DIAGNOSIS — J449 Chronic obstructive pulmonary disease, unspecified: Secondary | ICD-10-CM | POA: Diagnosis not present

## 2020-05-30 LAB — CULTURE, BLOOD (ROUTINE X 2)
Culture: NO GROWTH
Culture: NO GROWTH
Special Requests: ADEQUATE

## 2020-05-30 MED ORDER — POLYETHYLENE GLYCOL 3350 17 G PO PACK
34.0000 g | PACK | Freq: Once | ORAL | Status: AC
  Administered 2020-05-30: 34 g via ORAL
  Filled 2020-05-30: qty 2

## 2020-05-30 MED ORDER — UMECLIDINIUM-VILANTEROL 62.5-25 MCG/INH IN AEPB
1.0000 | INHALATION_SPRAY | Freq: Every day | RESPIRATORY_TRACT | Status: DC
  Administered 2020-05-31: 1 via RESPIRATORY_TRACT
  Filled 2020-05-30: qty 14

## 2020-05-30 MED ORDER — BUDESONIDE 0.5 MG/2ML IN SUSP
0.5000 mg | Freq: Two times a day (BID) | RESPIRATORY_TRACT | Status: DC
  Administered 2020-05-30 – 2020-05-31 (×2): 0.5 mg via RESPIRATORY_TRACT
  Filled 2020-05-30 (×3): qty 2

## 2020-05-30 MED ORDER — ALBUTEROL SULFATE (2.5 MG/3ML) 0.083% IN NEBU
2.5000 mg | INHALATION_SOLUTION | Freq: Three times a day (TID) | RESPIRATORY_TRACT | Status: DC
  Administered 2020-05-30 – 2020-05-31 (×3): 2.5 mg via RESPIRATORY_TRACT
  Filled 2020-05-30 (×4): qty 3

## 2020-05-30 MED ORDER — ACETAMINOPHEN 160 MG/5ML PO SOLN
1000.0000 mg | Freq: Three times a day (TID) | ORAL | Status: DC
  Administered 2020-05-30 – 2020-05-31 (×4): 1000 mg via ORAL
  Filled 2020-05-30 (×4): qty 40.6

## 2020-05-30 MED ORDER — SODIUM CHLORIDE 0.9 % IV SOLN
12.5000 mg | INTRAVENOUS | Status: DC | PRN
  Administered 2020-05-30 – 2020-05-31 (×4): 12.5 mg via INTRAVENOUS
  Filled 2020-05-30 (×5): qty 0.5

## 2020-05-30 MED ORDER — BISACODYL 5 MG PO TBEC
10.0000 mg | DELAYED_RELEASE_TABLET | Freq: Once | ORAL | Status: AC
  Administered 2020-05-30: 10 mg via ORAL
  Filled 2020-05-30: qty 2

## 2020-05-30 NOTE — Care Management Important Message (Signed)
Important Message  Patient Details  Name: [name] Fitzpatrick MRN: [id number] Date of Birth: 07/08/1955   Medicare Important Message Given:  Yes     Nyia Tsao [address] 05/30/2020, 4:13 PM

## 2020-05-30 NOTE — Progress Notes (Signed)
[facility] ([facility] Community Based Palliative Care       This patient has been referred to our palliative care services in the community.  [facility] will continue to follow for any discharge planning needs and to coordinate admission onto palliative care.    Thank you for the opportunity to participate in this patient's care.     Chrislyn King, BSN, RN [facility] Hospital Liaison   [phone] (24h on call) 

## 2020-05-30 NOTE — Progress Notes (Addendum)
PROGRESS NOTE                                                                             PROGRESS NOTE                                                                                                                                                                                                             Patient Demographics:    [name] Fitzpatrick, is a 65 y.o. female, DOB - May 03, 1955, QMV:[id number]  Outpatient Primary MD for the patient is Malka So., MD    LOS - 5  Admit date - 05/25/2020    Chief Complaint  Patient presents with  . Shortness of Breath       Brief Narrative    65 y.o. female with medical history significant of pulmonary fibrosis, seizure disorder chronic respiratory failure, history of ARDS, chronic obstructive lung disease, fibromyalgia, GERD, hypothyroidism, essential hypertension, history of thyroiditis and diabetes who was just discharged on March 4 from [facility] where she was admitted with acute on chronic respiratory failure with pulmonary fibrosis and community-acquired pneumonia.  Patient completed treatment and was discharged home.  She was not on any antibiotics.  Patient comes back to ED with worsening shortness of breath and wheezing, she was noted with increased oxygen requirement, she was admitted for further management of acute on chronic hypoxic respiratory failure.   Subjective:    Lorenda Ishihara today ports worsening cough overnight, reports is more productive.   Assessment  & Plan :    Principal Problem:   Acute on chronic respiratory failure with hypoxia (HCC) Active Problems:   COPD (chronic obstructive pulmonary disease) (HCC)   Anxiety state   Seizure disorder (HCC)   Interstitial pneumonia (HCC)  Acute on chronic respiratory failure with hypoxi. -This appears to be multifocal hypoxic respiratory failure in the setting of recurrent aspiration pneumonia,  superimposed on underlying ILD and post ARDS fibrosis. -Pulmonary input greatly appreciated, continue with broad-spectrum antibiotics.  She remains on vancomycin and  meropenem given her significant allergy profile -Continue with pulse steroids , steroid management per PCCM.  Gradually being tapered.  Patient p.o. regimen. -Chronic microaspiration felt to be contributing to her symptoms, so SLP consulted, she had MBS and is radiogram done 3/11, no significant finding on esophagogram, patient was educated by SLP about technique to minimize aspiration risk, patient reports he has been compliant with it, and she has not been coughing while drinking or eating currently. -She has been very compliant with her incentive spirometry and flutter valve. -Continue with Protonix twice daily. -Follow strict reflux precautions. -Oxygen requirement has improved, she will need prolonged steroid taper by 10 mg every 3 days.  COPD:  -  Continue breathing treatments, she is on pulse steroids  Chronic pain syndrome -Continue home medications medicine input greatly appreciated, she is started on long dose MS morphine, trying to minimize as needed Dilaudid, patient reports she is following with [facility], will forward palliative medicine records to them today can adjust medication if needed. -will Provide Narcan prescription on discharge as discussed with palliative medicine.  history of DVT: Not on anticoagulation at the moment.    She is currently on DVT prophylaxis dose  Anxiety disorder:  Patient reports she is not taking Klonopin, continue with Xanax.  GERD: Continue PPIs     SpO2: 99 % O2 Flow Rate (L/min): 3 L/min FiO2 (%): 40 %  Recent Labs  Lab 05/25/20 1129 05/25/20 1222 05/25/20 1305 05/25/20 1321 05/25/20 2211 05/26/20 0234 05/27/20 0240 05/29/20 0449  WBC 11.8*  --   --   --  8.7 10.2 10.0 8.9  PLT 219  --   --   --  178 183 170 188  AST 17  --   --   --   --  17  --    --   ALT 16  --   --   --   --  16  --   --   ALKPHOS 41  --   --   --   --  47  --   --   BILITOT 0.4  --   --   --   --  0.6  --   --   ALBUMIN 2.9*  --   --   --   --  3.0*  --   --   INR  --  0.9  --   --   --   --   --   --   LATICACIDVEN 1.4  --   --  0.9  --   --   --   --   SARSCOV2NAA  --   --  NEGATIVE  --   --   --   --   --     FiO2 (%):  [32 %-40 %] 40 %  ABG     Component Value Date/Time   PHART 7.410 12/25/2018 1230   PCO2ART 54.7 (H) 12/25/2018 1230   PO2ART 110 (H) 12/25/2018 1230   HCO3 30.8 (H) 05/25/2020 1315   TCO2 32 05/25/2020 1315   ACIDBASEDEF 0.1 01/30/2010 2033   O2SAT 97.0 05/25/2020 1315      Condition - Extremely Guarded  Family Communication  :  None at bedside  Code Status :  DNR  Consults  :  PCCM, palliative  Procedures  :  none  PUD Prophylaxis : PPI  Disposition Plan  :    Status is: Inpatient  Remains inpatient appropriate because:IV treatments appropriate  due to intensity of illness or inability to take PO   Dispo: The patient is from: Home              Anticipated [facility] Netzel/c is to: Home              Patient currently is not medically stable to [facility] Grewe/c.   Difficult to place patient No      DVT Prophylaxis  :  Lovenox   Lab Results  Component Value Date   PLT 188 05/29/2020    Diet :  Diet Order            Diet Heart Room service appropriate? Yes; Fluid consistency: Thin  Diet effective now                  Inpatient Medications  Scheduled Meds: . albuterol  2.5 mg Nebulization Q6H  . aspirin EC  81 mg Oral Daily  . enoxaparin (LOVENOX) injection  40 mg Subcutaneous Q24H  . fluticasone  2 spray Each Nare Daily  . gabapentin  100 mg Oral TID  . guaiFENesin  1,200 mg Oral BID  . levothyroxine  175 mcg Oral Daily  . lidocaine  1 patch Transdermal Q24H  . morphine  30 mg Oral Q12H  . pantoprazole (PROTONIX) IV  40 mg Intravenous Q12H  . polyethylene glycol  17 g Oral Daily  . predniSONE  50 mg Oral Q  breakfast  . sertraline  100 mg Oral Daily  . simvastatin  20 mg Oral QHS  . tiZANidine  8 mg Oral TID   Continuous Infusions: . meropenem (MERREM) IV 1 g (05/30/20 0507)  . promethazine (PHENERGAN) injection    . vancomycin 750 mg (05/30/20 0616)   PRN Meds:.acetaminophen **OR** acetaminophen, ALPRAZolam, guaiFENesin-dextromethorphan, HYDROmorphone, ondansetron (ZOFRAN) IV, ondansetron **OR** ondansetron (ZOFRAN) IV, promethazine (PHENERGAN) injection, traZODone  Antibiotics  :    Anti-infectives (From admission, onward)   Start     Dose/Rate Route Frequency Ordered Stop   05/27/20 0600  vancomycin (VANCOREADY) IVPB 750 mg/150 mL        750 mg 150 mL/hr over 60 Minutes Intravenous Every 12 hours 05/26/20 1220 05/30/20 2359   05/26/20 1300  meropenem (MERREM) 1 g in sodium chloride 0.9 % 100 mL IVPB        1 g 200 mL/hr over 30 Minutes Intravenous Every 8 hours 05/26/20 1218 05/30/20 2359   05/26/20 1300  vancomycin (VANCOREADY) IVPB 1250 mg/250 mL        1,250 mg 166.7 mL/hr over 90 Minutes Intravenous  Once 05/26/20 1220 05/26/20 1743       Dawood Elgergawy M.Tineka Uriegas on 05/30/2020 at 2:20 PM  To page go to [email or web address]   [facility] -  Office  [phone]    Objective:   Vitals:   05/29/20 2015 05/30/20 0204 05/30/20 0505 05/30/20 0744  BP:   104/60   Pulse: 91 68 62   Resp: 20 14 20 18   Temp:   98.2 F (36.8 C)   TempSrc:   Axillary   SpO2: 100% 100% 99%   Weight:      Height:        Wt Readings from Last 3 Encounters:  05/26/20 71.4 kg  07/29/15 83.9 kg  07/25/15 83.9 kg    No intake or output data in the 24 hours ending 05/30/20 1420   Physical Exam  Awake Alert, Oriented X 3, No new F.N deficits, Normal affect Symmetrical Chest wall movement, Good air movement  bilaterally, scattered rales RRR,No Gallops,Rubs or new Murmurs, No Parasternal Heave +ve B.Sounds, Abd Soft, No tenderness, No rebound - guarding or rigidity. No Cyanosis, Clubbing or  edema, No new Rash or bruise       Data Review:    CBC Recent Labs  Lab 05/25/20 1129 05/25/20 1315 05/25/20 2211 05/26/20 0234 05/27/20 0240 05/29/20 0449  WBC 11.8*  --  8.7 10.2 10.0 8.9  HGB 10.8* 10.2* 11.7* 11.2* 10.0* 10.5*  HCT 35.1* 30.0* 37.5 34.9* 30.8* 32.4*  PLT 219  --  178 183 170 188  MCV 92.6  --  91.5 89.7 88.8 89.0  MCH 28.5  --  28.5 28.8 28.8 28.8  MCHC 30.8  --  31.2 32.1 32.5 32.4  RDW 14.8  --  14.6 14.4 14.6 14.8  LYMPHSABS 2.1  --   --   --   --   --   MONOABS 0.8  --   --   --   --   --   EOSABS 0.5  --   --   --   --   --   BASOSABS 0.0  --   --   --   --   --     Recent Labs  Lab 05/25/20 1129 05/25/20 1222 05/25/20 1315 05/25/20 1321 05/25/20 2211 05/26/20 0234 05/27/20 0240 05/29/20 0449  NA 137  --  135  --   --  137 137 137  K 3.9  --  4.1  --   --  3.6 4.2 4.2  CL 100  --   --   --   --  101 104 104  CO2 28  --   --   --   --  29 26 30   GLUCOSE 103*  --   --   --   --  189* 148* 97  BUN 17  --   --   --   --  14 11 11   CREATININE 0.83  --   --   --  0.78 0.71 0.56 0.54  CALCIUM 9.4  --   --   --   --  8.8* 8.8* 8.5*  AST 17  --   --   --   --  17  --   --   ALT 16  --   --   --   --  16  --   --   ALKPHOS 41  --   --   --   --  47  --   --   BILITOT 0.4  --   --   --   --  0.6  --   --   ALBUMIN 2.9*  --   --   --   --  3.0*  --   --   LATICACIDVEN 1.4  --   --  0.9  --   --   --   --   INR  --  0.9  --   --   --   --   --   --     ------------------------------------------------------------------------------------------------------------------ No results for input(s): CHOL, HDL, LDLCALC, TRIG, CHOLHDL, LDLDIRECT in the last 72 hours.  Lab Results  Component Value Date   HGBA1C 5.6 10/25/2014   ------------------------------------------------------------------------------------------------------------------ No results for input(s): TSH, T4TOTAL, T3FREE, THYROIDAB in the last 72 hours.  Invalid input(s):  FREET3  Cardiac Enzymes No results for input(s): CKMB, TROPONINI, MYOGLOBIN in the last 168 hours.  Invalid input(s): CK ------------------------------------------------------------------------------------------------------------------  Component Value Date/Time   BNP 46.5 12/27/2018 0505    Micro Results Recent Results (from the past 240 hour(s))  Culture, blood (Routine x 2)     Status: None   Collection Time: 05/25/20 11:36 AM   Specimen: BLOOD  Result Value Ref Range Status   Specimen Description BLOOD RIGHT ANTECUBITAL  Final   Special Requests   Final    BOTTLES DRAWN AEROBIC ONLY Blood Culture results may not be optimal due to an inadequate volume of blood received in culture bottles   Culture   Final    NO GROWTH 5 DAYS Performed at [facility], [address] [place], [place] [address]    Report Status 05/30/2020 FINAL  Final  Resp Panel by RT-PCR (Flu A&B, Covid) Nasopharyngeal Swab     Status: None   Collection Time: 05/25/20  1:05 PM   Specimen: Nasopharyngeal Swab; Nasopharyngeal(NP) swabs in vial transport medium  Result Value Ref Range Status   SARS Coronavirus 2 by RT PCR NEGATIVE NEGATIVE Final    Comment: (NOTE) SARS-CoV-2 target nucleic acids are NOT DETECTED.  The SARS-CoV-2 RNA is generally detectable in upper respiratory specimens during the acute phase of infection. The lowest concentration of SARS-CoV-2 viral copies this assay can detect is 138 copies/mL. A negative result does not preclude SARS-Cov-2 infection and should not be used as the sole basis for treatment or other patient management decisions. A negative result may occur with  improper specimen collection/handling, submission of specimen other than nasopharyngeal swab, presence of viral mutation(s) within the areas targeted by this assay, and inadequate number of viral copies(<138 copies/mL). A negative result must be combined with clinical observations, patient history, and  epidemiological information. The expected result is Negative.  Fact Sheet for Patients:  BloggerCourse.com  Fact Sheet for Healthcare Providers:  SeriousBroker.it  This test is no t yet approved or cleared by the [place] FDA and  has been authorized for detection and/or diagnosis of SARS-CoV-2 by FDA under an Emergency Use Authorization (EUA). This EUA will remain  in effect (meaning this test can be used) for the duration of the COVID-19 declaration under Section 564(b)(1) of the Act, 21 U.S.C.section 360bbb-3(b)(1), unless the authorization is terminated  or revoked sooner.       Influenza A by PCR NEGATIVE NEGATIVE Final   Influenza B by PCR NEGATIVE NEGATIVE Final    Comment: (NOTE) The Xpert Xpress SARS-CoV-2/FLU/RSV plus assay is intended as an aid in the diagnosis of influenza from Nasopharyngeal swab specimens and should not be used as a sole basis for treatment. Nasal washings and aspirates are unacceptable for Xpert Xpress SARS-CoV-2/FLU/RSV testing.  Fact Sheet for Patients: BloggerCourse.com  Fact Sheet for Healthcare Providers: SeriousBroker.it  This test is not yet approved or cleared by the [place] FDA and has been authorized for detection and/or diagnosis of SARS-CoV-2 by FDA under an Emergency Use Authorization (EUA). This EUA will remain in effect (meaning this test can be used) for the duration of the COVID-19 declaration under Section 564(b)(1) of the Act, 21 U.S.C. section 360bbb-3(b)(1), unless the authorization is terminated or revoked.  Performed at [facility], [address], [place], [place] [address]   Culture, blood (Routine x 2)     Status: None   Collection Time: 05/25/20  1:06 PM   Specimen: BLOOD RIGHT HAND  Result Value Ref Range Status   Specimen Description BLOOD RIGHT HAND  Final   Special Requests   Final  BOTTLES DRAWN AEROBIC AND ANAEROBIC Blood Culture adequate volume   Culture   Final    NO GROWTH 5 DAYS Performed at [facility], [address] [place], [place] [address]    Report Status 05/30/2020 FINAL  Final    Radiology Reports DG Chest 2 View  Result Date: 05/25/2020 CLINICAL DATA:  Shortness of breath EXAM: CHEST - 2 VIEW COMPARISON:  01/06/2019 FINDINGS: Chronic interstitial opacities. No definite new consolidation or edema. No pleural effusion. No pneumothorax. Cardiomediastinal contours are within normal limits. Residual contrast within the bowel. IVC filter is noted. Decreased osseous mineralization with age-indeterminate mid thoracic compression fractures. IMPRESSION: Interstitial prominence that may reflect chronic interstitial lung disease. No definite acute abnormality. Age-indeterminate mid thoracic compression fractures. Electronically Signed   By: Guadlupe Spanish M.Aayushi Solorzano.   On: 05/25/2020 12:27   CT Angio Chest PE W and/or Wo Contrast  Result Date: 05/25/2020 CLINICAL DATA:  Chest pain or SOB, pleurisy or effusion suspected PE suspected, high prob EXAM: CT ANGIOGRAPHY CHEST WITH CONTRAST TECHNIQUE: Multidetector CT imaging of the chest was performed using the standard protocol during bolus administration of intravenous contrast. Multiplanar CT image reconstructions and MIPs were obtained to evaluate the vascular anatomy. CONTRAST:  OMNIPAQUE IOHEXOL 350 MG/ML SOLN COMPARISON:  Radiograph earlier today. Chest CT 09/14/2011 reviewed. FINDINGS: Cardiovascular: There are no filling defects within the pulmonary arteries to suggest pulmonary embolus. Dilated main pulmonary artery at 4.2 cm. Mild cardiomegaly. Coronary artery calcifications. Mild aortic tortuosity without dissection or aneurysm. Mild aortic atherosclerosis. Mediastinum/Nodes: Prominent mediastinal nodes including a 10 mm prevascular node, series 6, image 36. There is bilateral hilar adenopathy. Largest lymph node on  the right measures 16 mm, largest node on the left measures 10 mm. Thyroid gland not well-defined. Tiny hiatal hernia. Lungs/Pleura: Interstitial lung disease with subpleural reticulation, areas of interspersed ground-glass and septal thickening. Mild bronchiectasis in the right middle lobe. No frank honeycombing. No recent CT to assess for progression or change. There is no pleural fluid. Imaging obtained in expiration which limits assessment of the trachea and bronchi. No pneumothorax. Upper Abdomen: Assessed on concurrent abdominal CT, reported separately. Musculoskeletal: Exaggerated thoracic kyphosis. Compression fractures at T3, T5, and T7 which were assessed on thoracic spine CT last month, no interval change. L1 compression fracture with vertebral augmentation. No acute osseous abnormalities are seen. Review of the MIP images confirms the above findings. IMPRESSION: 1. No pulmonary embolus. 2. Dilated main pulmonary artery consistent with pulmonary arterial hypertension. 3. Interstitial lung disease with subpleural reticulation, areas of interspersed ground-glass and septal thickening. No frank honeycombing. Consider follow-up high-resolution chest CT on a nonemergent basis for further interstitial lung disease characterization. 4. Mediastinal and bilateral hilar adenopathy, likely reactive. 5. Coronary artery calcifications. 6. Multiple thoracic compression fractures, unchanged from thoracic spine CT last month at [facility]. Aortic Atherosclerosis (ICD10-I70.0). Electronically Signed   By: Narda Rutherford M.Jordani Nunn.   On: 05/25/2020 15:39   CT ABDOMEN PELVIS W CONTRAST  Result Date: 05/25/2020 CLINICAL DATA:  Abdominal distension.  Abdominal pain. EXAM: CT ABDOMEN AND PELVIS WITH CONTRAST TECHNIQUE: Multidetector CT imaging of the abdomen and pelvis was performed using the standard protocol following bolus administration of intravenous contrast. Delayed images are performed and associated with concurrent  chest CT. CONTRAST:  OMNIPAQUE IOHEXOL 350 MG/ML SOLN COMPARISON:  None. FINDINGS: Lower chest: Assessed on concurrent chest CT, reported separately. Hepatobiliary: No focal liver abnormality is seen. Status post cholecystectomy. No biliary dilatation. Pancreas: Diffuse fatty atrophy.  No ductal dilatation or inflammation. Spleen: Normal in size. Scattered calcified granuloma. There are 2 low-density lesions, largest measuring 2.2 cm in the anterior mid aspect. Adrenals/Urinary Tract: No adrenal nodule. No hydronephrosis or perinephric edema. Homogeneous renal enhancement. Absent renal excretion on delayed phase imaging. No renal calculi. Urinary bladder is partially distended without wall thickening. Stomach/Bowel: Unremarkable stomach. No small bowel obstruction or inflammation. High-density material/contrast is seen throughout the colon. There is a large volume of colonic stool, primarily in the right colon. Cecum is located in the deep pelvis. Appendix is not confidently visualized, no evidence of appendicitis. Transverse, descending, and sigmoid colon are tortuous. There is no colonic wall thickening or pericolonic edema. No significant diverticular disease. Vascular/Lymphatic: IVC filter in place. Patent portal vein. Normal caliber abdominal aorta. No bulky abdominopelvic adenopathy. Reproductive: Status post hysterectomy. No adnexal masses. Other: No ascites or free air. No intra-abdominal fluid collection. No body wall hernia. Musculoskeletal: L1 compression fracture with vertebral augmentation. Posterior fusion L4-S1. There are no acute or suspicious osseous abnormalities. IMPRESSION: 1. Large volume of colonic stool, primarily in the right colon, can be seen with constipation. No bowel obstruction or inflammation. 2. Absent renal excretion on delayed phase imaging, suggest renal dysfunction. 3. Two low-density splenic lesions, largest measuring 2.2 cm. In the absence of known malignancy, this is  likely benign. Aortic Atherosclerosis (ICD10-I70.0). Electronically Signed   By: Narda Rutherford M.Emanuele Mcwhirter.   On: 05/25/2020 15:32   DG Swallowing Func-Speech Pathology  Result Date: 05/27/2020 Objective Swallowing Evaluation: Type of Study: MBS-Modified Barium Swallow Study  Patient Details Name: LYNESHA KRAHN MRN: [id number] Date of Birth: 1955/08/13 Today's Date: 05/27/2020 Time: SLP Start Time (ACUTE ONLY): 1400 -SLP Stop Time (ACUTE ONLY): 1420 SLP Time Calculation (min) (ACUTE ONLY): 20 min Past Medical History: Past Medical History: Diagnosis Date . Acute deep vein thrombosis (DVT) of left femoral vein (HCC)  . Acute on chronic respiratory failure with hypoxia (HCC)  . Anxiety  . ARDS (adult respiratory distress syndrome) (HCC)  . Chronic LBP  . Chronic neck pain  . COPD (chronic obstructive pulmonary disease) (HCC)  . Depression  . DM type 2 (diabetes mellitus, type 2) (HCC)  . Dyspnea 05/25/2020 . Fibromyalgia  . GERD (gastroesophageal reflux disease)  . H/O pulmonary fibrosis  . H/O suicide attempt  . Hashimoto's thyroiditis  . Hypertension  . Hypothyroidism  . IBS (irritable bowel syndrome)   with constipation . Interstitial pneumonia (HCC)  . Migraine headache  . Osteoarthritis  . Seizure disorder (HCC)  . 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 ABDOMINAL HYSTERECTOMY   . b/l foot surgery --bunions   . BIOPSY  07/29/2015  Procedure: BIOPSY;  Surgeon: Malissa Hippo, MD;  Location: [facility];  Service: Endoscopy;;  Duodenal,  gastric, and esophageal  biopsies . C-Spine fusion  1991, 2004 . CHOLECYSTECTOMY   . COLONOSCOPY WITH PROPOFOL N/A 07/29/2015  Procedure: COLONOSCOPY WITH PROPOFOL;  Surgeon: Malissa Hippo, MD;  Location: [facility];  Service: Endoscopy;  Laterality: N/A;  8:30 . ESOPHAGEAL DILATION N/A 07/29/2015  Procedure: ESOPHAGEAL DILATION;  Surgeon: Malissa Hippo, MD;   Location: [facility];  Service: Endoscopy;  Laterality: N/A; . ESOPHAGOGASTRODUODENOSCOPY (EGD) WITH PROPOFOL N/A 07/29/2015  Procedure: ESOPHAGOGASTRODUODENOSCOPY (EGD) WITH PROPOFOL;  Surgeon: Malissa Hippo, MD;  Location: [facility];  Service: Endoscopy;  Laterality: N/A; . l carpal tunnel release Right  . l  spine fusion x 2   . reconstucted right ankle   . Right knee arthroscopic surgery   . total reconstruction of right ankle and heel    total of three  . VESICOVAGINAL FISTULA CLOSURE W/ TAH  1993  Fibroids no Ca HPI: Pt is a 65 y.o. female with medical history significant of pulmonary fibrosis, seizure disorder chronic respiratory failure, history of ARDS, chronic obstructive lung disease, fibromyalgia, GERD, hypothyroidism, essential hypertension, history of thyroiditis and diabetes who was just discharged on March 4 from [facility] where she was admitted with acute on chronic respiratory failure with pulmonary fibrosis and community-acquired pneumonia. Pt presented to the ED with SOB. CXR 3/9: Interstitial prominence that may reflect chronic interstitial lung disease. No definite acute abnormality. MBS 3/1 at [facility] was negative for aspiration, but revealed transient penetration secondary reduced laryngeal elevation, reduced anterior hyoid movement/excursion, and reduced laryngeal vestibule closure. A regular texture diet with thin liquids was recommended at that time and SLP services were discontinued. Esophagram 3/11: No abnormality identified on limited exam. No stricture or mass. No  gastroesophageal reflux demonstrated.  No data recorded Assessment / Plan / Recommendation CHL IP CLINICAL IMPRESSIONS 05/27/2020 Clinical Impression Pt's swallow function appears similar to that which was noted at [facility] on 3/1 with pharyngeal dysphagia characterized by reduced anterior laryngeal movement, reduced hyolaryngeal elevation, and reduced laryngeal closure  secondary to incomplete epiglottic inversion. However, laryngeal invasion has worsed from PAS 2 to PAS 3 and it is anticipated that consistent aspiration would have been an eventuality if compensatory coughing was not intermittently prompted by the SLP. Coughing was also occasionally noted between swallows and it is possible that some instances of penetration did result in aspiration. No functional benefit was noted with a chin tuck posture or with a chin tuck posture which was paired with left head turn. However, a chin tuck posture with right head turn eliminated penetration. No instances of penetration or aspiration were noted with nectar thick liquids. 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 It is recommended that the pt's current diet be continued with strict observance of swallowing precautions. Pt has been on thickened liquids in the past and has indicated that she would rather use postural modifications than have nectar thick liquids. SLP will follow to assess diet tolerance and to determine need to diet modification pending compliance with swallowing precautions. SLP Visit Diagnosis Dysphagia, pharyngeal phase (R13.13) Attention and concentration deficit following -- Frontal lobe and executive function deficit following -- Impact on safety and function Mild aspiration risk   CHL IP TREATMENT RECOMMENDATION 05/27/2020 Treatment Recommendations Therapy as outlined in treatment plan below   Prognosis 05/27/2020 Prognosis for Safe Diet Advancement Good Barriers to Reach Goals -- Barriers/Prognosis Comment -- CHL IP DIET RECOMMENDATION 05/27/2020 SLP Diet Recommendations Regular solids;Thin liquid Liquid Administration via Cup;Straw Medication Administration Whole meds with puree Compensations Slow rate;Small sips/bites;Chin tuck Postural Changes Seated upright at 90 degrees;Remain semi-upright after after feeds/meals (Comment)   No flowsheet data found.  CHL IP FOLLOW UP RECOMMENDATIONS 05/27/2020  Follow up Recommendations Outpatient SLP   CHL IP FREQUENCY AND DURATION 05/27/2020 Speech Therapy Frequency (ACUTE ONLY) min 2x/week Treatment Duration 2 weeks      CHL IP ORAL PHASE 05/27/2020 Oral Phase WFL Oral - Pudding Teaspoon -- Oral - Pudding Cup -- Oral - Honey Teaspoon -- Oral - Honey Cup -- Oral - Nectar Teaspoon -- Oral - Nectar Cup -- Oral - Nectar Straw -- Oral -  Thin Teaspoon -- Oral - Thin Cup -- Oral - Thin Straw -- Oral - Puree -- Oral - Mech Soft -- Oral - Regular -- Oral - Multi-Consistency -- Oral - Pill -- Oral Phase - Comment --  CHL IP PHARYNGEAL PHASE 05/27/2020 Pharyngeal Phase Impaired Pharyngeal- Pudding Teaspoon -- Pharyngeal -- Pharyngeal- Pudding Cup -- Pharyngeal -- Pharyngeal- Honey Teaspoon -- Pharyngeal -- Pharyngeal- Honey Cup -- Pharyngeal -- Pharyngeal- Nectar Teaspoon -- Pharyngeal -- Pharyngeal- Nectar Cup Reduced anterior laryngeal mobility;Reduced epiglottic inversion;Reduced laryngeal elevation;Reduced airway/laryngeal closure Pharyngeal -- Pharyngeal- Nectar Straw Reduced anterior laryngeal mobility;Reduced epiglottic inversion;Reduced laryngeal elevation;Reduced airway/laryngeal closure Pharyngeal -- Pharyngeal- Thin Teaspoon -- Pharyngeal -- Pharyngeal- Thin Cup Reduced anterior laryngeal mobility;Reduced epiglottic inversion;Reduced laryngeal elevation;Reduced airway/laryngeal closure;Penetration/Aspiration during swallow;Penetration/Apiration after swallow Pharyngeal Material enters airway, remains ABOVE vocal cords and not ejected out;Material enters airway, passes BELOW cords and not ejected out despite cough attempt by patient Pharyngeal- Thin Straw Reduced anterior laryngeal mobility;Reduced epiglottic inversion;Reduced laryngeal elevation;Reduced airway/laryngeal closure Pharyngeal -- Pharyngeal- Puree Reduced anterior laryngeal mobility;Reduced epiglottic inversion;Reduced laryngeal elevation;Reduced airway/laryngeal closure Pharyngeal -- Pharyngeal- Mechanical  Soft -- Pharyngeal -- Pharyngeal- Regular Reduced anterior laryngeal mobility;Reduced epiglottic inversion;Reduced laryngeal elevation;Reduced airway/laryngeal closure Pharyngeal -- Pharyngeal- Multi-consistency -- Pharyngeal -- Pharyngeal- Pill Reduced anterior laryngeal mobility;Reduced epiglottic inversion;Reduced laryngeal elevation;Reduced airway/laryngeal closure Pharyngeal -- Pharyngeal Comment --  CHL IP CERVICAL ESOPHAGEAL PHASE 05/27/2020 Cervical Esophageal Phase Impaired Pudding Teaspoon -- Pudding Cup -- Honey Teaspoon -- Honey Cup -- Nectar Teaspoon -- Nectar Cup -- Nectar Straw -- Thin Teaspoon -- Thin Cup Prominent cricopharyngeal segment Thin Straw Prominent cricopharyngeal segment Puree Prominent cricopharyngeal segment Mechanical Soft -- Regular Prominent cricopharyngeal segment Multi-consistency -- Pill Prominent cricopharyngeal segment Cervical Esophageal Comment -- Shanika I. Vear Clock, MS, CCC-SLP Acute Rehabilitation Services Office number [phone] Pager [phone] Scheryl Marten 05/27/2020, 3:50 PM              DG ESOPHAGUS W SINGLE CM (SOL OR THIN BA)  Result Date: 05/27/2020 CLINICAL DATA:  Difficulty swallowing foods EXAM: ESOPHOGRAM/BARIUM SWALLOW TECHNIQUE: Single contrast examination was performed using thin barium or water soluble. FLUOROSCOPY TIME:  Fluoroscopy Time:  54 seconds Radiation Exposure Index (if provided by the fluoroscopic device): 10.4 mGy Number of Acquired Spot Images: 1 COMPARISON:  None. FINDINGS: Limited exam due to patient immobility. Exam performed and semi supine positioning (approximately 30 degrees off horizontal). Normal swallowing function in the high cervical esophagus. No stricture or mass in the thoracic esophagus or distal esophagus. There is some to and fro motion of the barium bolus which may be in part due to reclined positioning. No gastroesophageal reflux reflux demonstrated. IMPRESSION: No abnormality identified on limited exam. No  stricture or mass. No gastroesophageal reflux demonstrated. Electronically Signed   By: Genevive Bi M.Monserrate Blaschke.   On: 05/27/2020 14:17

## 2020-05-30 NOTE — TOC Progression Note (Signed)
Transition of Care [facility]) - Progression Note    Patient Details  Name: [name] Fitzpatrick MRN: [id number] Date of Birth: 05-04-1955  Transition of Care [facility]) CM/SW [place], RN Phone Number: [phone]  05/30/2020, 11:11 AM  Clinical Narrative:    [facility] consulted for outpatient palliative consult. Patient has previously been referred to [facility] for outpatient palliative but was not seen. CM has contacted Chrislyn with [facility] . Palliative will follow as outpatient referral.        Expected Discharge Plan and Services                                                 Social Determinants of Health (SDOH) Interventions    Readmission Risk Interventions No flowsheet data found.

## 2020-05-30 NOTE — Progress Notes (Addendum)
NAME:  [name] Fitzpatrick, MRN:  [id number], DOB:  1955-04-04, LOS: 5 ADMISSION DATE:  05/25/2020, CONSULTATION DATE:  3/10 REFERRING MD:  E;gergawy, CHIEF COMPLAINT:  Dyspnea   Brief History:  65 year old female w/ chronic resp failure d/t ILD (felt UIP pattern) c/b BTX and post ARDS pulm fibrotic changes. Just d/c'd from hospital 3/6 after being treated for working dx of CAP and IPF flare. Re-admitted 4 d after dc for progressive inc WOB and O2 need.    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 w/ bronchiectasis, Chronic respiratory failure w/ (severe restrictive disease on PFTs and reduced DLCO spiro 12/20). Negative serology. On chronic oxygen 3 -4 liters  Chronic stable lung nodule.  Prior epistaxis  H/o ARDS and prolonged VDRF Prior Trach (decannulated at LTAC)-->2020 Prior MDR UTI RHc and LHC 10/07/18 Normal coronary arteries, Normal cardiac output, Mild Pulmonary HTN  GERD Seizure disorder Fibromyalgia  Chronic pain  Significant Hospital Events:  3/10 admitted  Consults:  pulm   Procedures:     Significant Diagnostic Tests:  CT chest 1. No pulmonary embolus. 2. Dilated main pulmonary artery consistent with pulmonary arterial hypertension.3. Interstitial lung disease with subpleural reticulation, areas of interspersed ground-glass and septal thickening. No frankhoneycombing. 4. Mediastinal and bilateral hilar adenopathy, likely reactive.5. Coronary artery calcifications.6. Multiple thoracic compression fractures, unchanged from thoracic spine CT last month at [facility].  MBS at [facility] 3/1  Pharyngeal phase c/b  -reduced laryngeal elevation -reduced anterior hyoid movement/excursion -reduced laryngeal vestibule closure Above mentioned characteristics resulting in transient penetration above the folds and ejected independently with thin and  nectar/mildly thick liquids. Of note, patient with cricopharyngeal hypertrophy/bar that was visualized with all consistencies given. Patient reported something feeling stuck and however no residue visualized following swallows of various consistencies. Retrograde flow through the UES following regular solid was demonstrated   Micro Data:  RVP neg   Antimicrobials:  vanc 3/10 Meropenem 3/10 Interim History / Subjective:  Breathing slowly improving. Some chest discomfort yesterday, which was workup up by primary team. Feels better today.   Objective   Blood pressure 104/60, pulse 62, temperature 98.2 F (36.8 C), temperature source Axillary, resp. rate 18, height '5\' 2"'  (1.575 m), weight 71.4 kg, SpO2 99 %.    FiO2 (%):  [32 %-40 %] 40 %  No intake or output data in the 24 hours ending 05/30/20 1329 Filed Weights   05/25/20 1128 05/26/20 1253  Weight: 67.6 kg 71.4 kg    Examination:  General:  Chronically ill appearing female resting comfortably in bedside chair.  Neuro:  Alert, oriented, non-focal HEENT:  [place]/AT, No JVD noted, PERRL Cardiovascular:  RRR, no MRG Lungs:  Rales L >R. No distress. 3L [place].  Abdomen:  Soft, non-distended, non-tender Musculoskeletal:  No acute deformity or ROM limitation.  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 Post ARDS fibrosis superimposed on possible ILD, but this is  not described by her primary pulmonologist at [facility].  Possibly a  component aspiration secondary to LPR and/or dysphagia. Swallowing evaluations have conflicted somewhat.  - Off antibiotics  - Prednisone taper by 2m every 3 days until off.  - Incentive spirometry and flutter valve.  - PPI to continue BID - Reflux precautions - outpatient follow up arranged.   COPD - Anoro BID and pulmicort nebs. PRN duoneb.  - Order placed for new nebulizer at patient request - Finances have been an issue in obtaining COPD treatment. Presumably after two hospitalizations this year her deductible should have been met, so Anoro and pulmicort should be attainable.  We can follow this up in the pulmonary clinic on 3/28.  - The patient is inquiring about BiPAP. She has used this in the hospital and wishes to have it at home, but does not show chronic hypercarbia by serum CO2 levels and will not qualify. Perhaps she would benefit from sleep study, which can be arranged as an outpatient.   Best practice (evaluated daily)  Per Primary    Critical care time: NA    PGeorgann Housekeeper AGACNP-BC [facility]  See Amion for personal pager PCCM on call pager ([phone] 7pm. Please call Elink 7p-7a. [phone] 05/30/2020 2:00 PM
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
## 2020-05-30 NOTE — Plan of Care (Signed)

## 2020-05-30 NOTE — Plan of Care (Signed)

## 2020-05-30 NOTE — Evaluation (Signed)
Physical Therapy Evaluation Patient Details Name: [name] Fitzpatrick MRN: [id number] DOB: January 24, 1956 Today's Date: 05/30/2020   History of Present Illness  65 yo female with onset of acute respiratory failure was brought to hosp, had CAP layered on pulm fibrosis.  She returned on 3/9 with SOB and wheezing, pulm HTN, increased home O2 to 3L.  Productive cough, O2 up to 6L and now being managed for fibrosis.  PMHx:  ILD, chronic back pain, COPD, DVT LLE, chronic resp failure, ARDS, depression, thoracic compression fractures, HTN, DM, suicide attempt, seizure disorder, thyroiditis, IBS, hypothyroidism  Clinical Impression  Pt was seen for evaluation of mobility and noted that she is able to walk with close monitoring of sats.  Had to increase 3L O2 to 4L to walk on the hallway as she dropped to 86% just in standing at side of bed.  With close monitoring and observation of less conversation she could complete the walk without further drops.  Recovered to 96% quickly upon sitting.  Follow for recovery of distances and increase LE strength as tolerated.      Follow Up Recommendations Home health PT;Supervision for mobility/OOB    Equipment Recommendations  None recommended by PT    Recommendations for Other Services       Precautions / Restrictions Precautions Precautions: Fall Precaution Comments: monitor O2 sats Restrictions Weight Bearing Restrictions: No      Mobility  Bed Mobility               General bed mobility comments: up in chair when PT arrived    Transfers Overall transfer level: Needs assistance Equipment used: Rolling walker (2 wheeled) Transfers: Sit to/from Stand Sit to Stand: Min guard            Ambulation/Gait Ambulation/Gait assistance: Min guard Gait Distance (Feet): 60 Feet Assistive device: IV Pole Gait Pattern/deviations: Step-through pattern;Decreased stride length Gait velocity: controlled Gait velocity interpretation: <1.31 ft/sec,  indicative of household ambulator General Gait Details: increased O2 to 4L to manage sats, which stayed above 90% after this  Science writer    Modified Rankin (Stroke Patients Only)       Balance Overall balance assessment: Needs assistance Sitting-balance support: Feet supported Sitting balance-Leahy Scale: Good     Standing balance support: Single extremity supported Standing balance-Leahy Scale: Fair                               Pertinent Vitals/Pain Pain Assessment: Faces Faces Pain Scale: Hurts even more Pain Location: back Pain Descriptors / Indicators: Grimacing;Guarding Pain Intervention(s): Limited activity within patient's tolerance;Premedicated before session;Repositioned    Home Living Family/patient expects to be discharged to:: Private residence Living Arrangements: Spouse/significant other Available Help at Discharge: Family;Available PRN/intermittently Type of Home: House Home Access: Stairs to enter Entrance Stairs-Rails: None Entrance Stairs-Number of Steps: 1 Home Layout: One level Home Equipment: Clinical cytogeneticist - 2 wheels;Cane - single point      Prior Function Level of Independence: Independent with assistive device(s)         Comments: on 2L O2 at home     Hand Dominance        Extremity/Trunk Assessment   Upper Extremity Assessment Upper Extremity Assessment: Overall WFL for tasks assessed    Lower Extremity Assessment Lower Extremity Assessment: Generalized weakness    Cervical / Trunk Assessment Cervical / Trunk Assessment: Kyphotic  Communication   Communication: No difficulties  Cognition Arousal/Alertness: Awake/alert Behavior During Therapy: WFL for tasks assessed/performed Overall Cognitive Status: Within Functional Limits for tasks assessed                                        General Comments General comments (skin integrity, edema, etc.): ptis up  in chair when PT arrives, walking with O2 increased to stand and walk    Exercises     Assessment/Plan    PT Assessment Patient needs continued PT services  PT Problem List Decreased strength;Decreased activity tolerance;Decreased balance;Cardiopulmonary status limiting activity       PT Treatment Interventions DME instruction;Gait training;Stair training;Functional mobility training;Therapeutic activities;Therapeutic exercise;Balance training;Neuromuscular re-education;Patient/family education    PT Goals (Current goals can be found in the Care Plan section)  Acute Rehab PT Goals Patient Stated Goal: to go home and relieve back PT Goal Formulation: With patient Time For Goal Achievement: 06/13/20 Potential to Achieve Goals: Good    Frequency Min 3X/week   Barriers to discharge Inaccessible home environment;Decreased caregiver support home alone when husband at work and has a stair to enter house    Co-evaluation               AM-PAC PT "6 Clicks" Mobility  Outcome Measure Help needed turning from your back to your side while in a flat bed without using bedrails?: A Little Help needed moving from lying on your back to sitting on the side of a flat bed without using bedrails?: A Little Help needed moving to and from a bed to a chair (including a wheelchair)?: A Little Help needed standing up from a chair using your arms (e.g., wheelchair or bedside chair)?: A Little Help needed to walk in hospital room?: A Little Help needed climbing 3-5 steps with a railing? : A Lot 6 Click Score: 17    End of Session Equipment Utilized During Treatment: Gait belt;Oxygen Activity Tolerance: Patient limited by fatigue;Treatment limited secondary to medical complications (Comment) Patient left: in chair;with call bell/phone within reach;with chair alarm set Nurse Communication: Mobility status PT Visit Diagnosis: Unsteadiness on feet (R26.81);Muscle weakness (generalized) (M62.81)     Time: 9892-1194 PT Time Calculation (min) (ACUTE ONLY): 28 min   Charges:   PT Evaluation $PT Eval Moderate Complexity: 1 Mod PT Treatments $Gait Training: 8-22 mins       Ramond Dial 05/30/2020, 10:22 PM Mee Hives, PT [facility] Acute Rehab Dept. Number: [place] and [place]

## 2020-05-31 DIAGNOSIS — J849 Interstitial pulmonary disease, unspecified: Secondary | ICD-10-CM | POA: Diagnosis not present

## 2020-05-31 DIAGNOSIS — J8410 Pulmonary fibrosis, unspecified: Secondary | ICD-10-CM | POA: Diagnosis not present

## 2020-05-31 DIAGNOSIS — J449 Chronic obstructive pulmonary disease, unspecified: Secondary | ICD-10-CM | POA: Diagnosis not present

## 2020-05-31 DIAGNOSIS — Z515 Encounter for palliative care: Secondary | ICD-10-CM | POA: Diagnosis not present

## 2020-05-31 DIAGNOSIS — J9621 Acute and chronic respiratory failure with hypoxia: Secondary | ICD-10-CM | POA: Diagnosis not present

## 2020-05-31 MED ORDER — PANTOPRAZOLE SODIUM 40 MG PO TBEC: 40.0000 mg | DELAYED_RELEASE_TABLET | Freq: Two times a day (BID) | ORAL | Status: DC

## 2020-05-31 MED ORDER — NALOXONE HCL 4 MG/0.1ML NA LIQD: 1.0000 | Freq: Once | NASAL | 0 refills | Status: AC

## 2020-05-31 MED ORDER — IPRATROPIUM-ALBUTEROL 0.5-2.5 (3) MG/3ML IN SOLN: 3.0000 mL | Freq: Four times a day (QID) | RESPIRATORY_TRACT | 1 refills | Status: DC | PRN

## 2020-05-31 MED ORDER — PREDNISONE 50 MG PO TABS: ORAL_TABLET | ORAL | 0 refills | Status: DC

## 2020-05-31 NOTE — Discharge Summary (Signed)
[name] Fitzpatrick, is a 65 y.o. female  DOB 1955/10/19  MRN [id number].  Admission date:  05/25/2020  Admitting Physician  Elwyn Reach, MD  Discharge Date:  05/31/2020   Primary MD  Lilian Coma., MD  Recommendations for primary care physician for things to follow:  -Please check CBC, CMP during next visit. -Patient to follow with pulmonary service as an outpatient. -Continue with SLP follow-up as an outpatient to minimize risk of aspiration  Admission Diagnosis  Pulmonary fibrosis (HCC) [J84.10] SOB (shortness of breath) [R06.02] Hypoxia [R09.02] Acute on chronic respiratory failure (HCC) [J96.20] Constipation, unspecified constipation type [K59.00]   Discharge Diagnosis  Pulmonary fibrosis (HCC) [J84.10] SOB (shortness of breath) [R06.02] Hypoxia [R09.02] Acute on chronic respiratory failure (HCC) [J96.20] Constipation, unspecified constipation type [K59.00]    Principal Problem:   Acute on chronic respiratory failure with hypoxia ([place]) Active Problems:   COPD (chronic obstructive pulmonary disease) (HCC)   Anxiety state   Seizure disorder ([place])   Interstitial pneumonia ([place])      Past Medical History:  Diagnosis Date  . Acute deep vein thrombosis (DVT) of left femoral vein ([place])   . Acute on chronic respiratory failure with hypoxia ([place])   . Anxiety   . ARDS (adult respiratory distress syndrome) ([place])   . Chronic LBP   . Chronic neck pain   . COPD (chronic obstructive pulmonary disease) ([place])   . Depression   . DM type 2 (diabetes mellitus, type 2) ([place])   . Dyspnea 05/25/2020  . Fibromyalgia   . GERD (gastroesophageal reflux disease)   . H/O pulmonary fibrosis   . H/O suicide attempt   . Hashimoto's thyroiditis   . Hypertension   . Hypothyroidism   . IBS (irritable bowel syndrome)    with constipation  . Interstitial pneumonia ([place])   . Migraine headache   .  Osteoarthritis   . Seizure disorder ([place])   . Thyroiditis, 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   had total thyroidectomy 11/30/2013 at [facility]    Past Surgical History:  Procedure Laterality Date  . ABDOMINAL HYSTERECTOMY    . b/l foot surgery --bunions    . BIOPSY  07/29/2015   Procedure: BIOPSY;  Surgeon: Rogene Houston, MD;  [facility];  Service: Endoscopy;;  Duodenal,  gastric, and esophageal  biopsies  . C-Spine fusion  1991, 2004  . CHOLECYSTECTOMY    . COLONOSCOPY WITH PROPOFOL N/A 07/29/2015   Procedure: COLONOSCOPY WITH PROPOFOL;  Surgeon: Rogene Houston, MD;  [facility];  Service: Endoscopy;  Laterality: N/A;  8:30  . ESOPHAGEAL DILATION N/A 07/29/2015   Procedure: ESOPHAGEAL DILATION;  Surgeon: Rogene Houston, MD;  [facility];  Service: Endoscopy;  Laterality: N/A;  . ESOPHAGOGASTRODUODENOSCOPY (EGD) WITH PROPOFOL N/A 07/29/2015   Procedure: ESOPHAGOGASTRODUODENOSCOPY (EGD) WITH PROPOFOL;  Surgeon: Rogene Houston, MD;  [facility];  Service: Endoscopy;  Laterality: N/A;  . l carpal tunnel release Right   . l spine fusion x 2    .  reconstucted right ankle    . Right knee arthroscopic surgery    . total reconstruction of right ankle and heel     total of three   . VESICOVAGINAL FISTULA CLOSURE W/ TAH  1993   Fibroids no Ca       History of present illness and  Hospital Course:     Kindly see H&P for history of present illness and admission details, please review complete Labs, Consult reports and Test reports for all details in brief  HPI  from the history and physical done on the day of admission 05/25/2020  HPI: [name] Fitzpatrick is a 65 y.o. female with medical history significant of pulmonary fibrosis, seizure disorder chronic respiratory failure, history of ARDS, chronic obstructive lung disease, fibromyalgia, GERD, hypothyroidism, essential hypertension, history of thyroiditis and diabetes who was just discharged on March 4 from  [facility] where she was admitted with acute on chronic respiratory failure with pulmonary fibrosis and community-acquired pneumonia.  Patient completed treatment and was discharged home.  She was not on any antibiotics.  Patient returned to the ER today with worsening shortness of breath cough and wheezing.  Her baseline oxygen was 2 to 3 L.  Patient is now requiring up to 6 L.  Patient also complains some nausea.  She is also having cough productive of white sputum.  Some abdominal pain.  Patient has been on steroids but not antibiotics after recent discharge.  Can walk a few hours down CT angiogram did not show active pneumonia however suggestive of possible pulmonary fibrosis.  She is therefore being admitted to the hospital for further evaluation and treatment.  She was initially suspected to have had aspiration pneumonia due to her nausea and some vomiting..  ED Course: Temperature is 99 blood pressure 80/58 initially pulse 76 respiratory 24 oxygen sat 96% on 6 L.  Urinalysis essentially negative.  Troponin is negative.  pH of 7.483 venous blood.  Hemoglobin 10.2.  Chemistries largely within normal except glucose 103.  Lactic acid 1.4.  White count is 11.8.  Chest x-ray showed interstitial prominence probably chronic chronic interstitial lung disease.  Also multiple midthoracic compression fractures probably old.  CT abdomen pelvis showed large volume colonic stool mainly in the right probably constipation.  The low-density splenic lesions probably benign.  CT angiogram of the chest showed no PE.  Evidence of pulmonary arterial hypertension.  Also interstitial lung disease as well as mediastinal and bilateral hilar adenopathy.  Also multiple thoracic compression fractures which is called.  Patient being admitted with worsening acute respiratory failure.  No evidence of pneumonia   Hospital Course    Acute on chronic respiratory failure with hypoxi. -This appears to be  multifocal hypoxic respiratory failure in the setting of recurrent aspiration pneumonia, superimposed on underlying ILD and post ARDS fibrosis, with COPD and bronchiectasis. -Pulmonary input greatly appreciated, it was felt recurrent microaspiration contributing mainly to her symptoms, giving her significant allergy profile, she was treated for total of 5 days of IV vancomycin and meropenem . -She was treated with IV steroids, she will be discharged on prednisone taper by 10 mg every 3 days until off per pulmonary recommendation . -She will be encouraged to take her incentive spirometry, flutter valve and keep using at home . -Chronic microaspiration felt to be contributing to her symptoms, so SLP consulted, she had MBS and esophagiogram done 3/11, no significant finding on esophagogram, patient was educated by SLP about technique to minimize aspiration  risk, patient reports he has been compliant with it, and she has not been coughing while drinking or eating currently. -Continue with Protonix twice daily. -Follow strict reflux precautions. -Oxygen requirement has improved, she is back at her baseline 2 to 4 L nasal cannula  COPD: - Continue breathing treatments, she is on pulse steroids  Chronic pain syndrome -she is following with [facility] pain management. -will Provide Narcan prescription on discharge as discussed with palliative medicine. -Continue with home medications on discharge, she is to follow with her pain management clinic to see if further adjustment is needed.  history of DVT:Not on anticoagulation at the moment.   She is currently on DVT prophylaxis dose  Anxiety disorder: Patient reports she is not taking Klonopin, continue with Xanax.  Resume home medications  GERD:Continue PPIs  Patient was seen by palliative medicine regarding her chronic pain syndrome, and advanced lung disease.  Discharge Condition: stable   Follow UP   Follow-up Information     Parrett, Fonnie Mu, NP Follow up on 06/13/2020.   Specialty: Pulmonary Disease Why: 10:00 AM Contact information: [address] [place] [address] [phone]                 Discharge Instructions  and  Discharge Medications    Discharge Instructions    Discharge instructions   Complete by: As directed    Follow with Primary MD Lilian Coma., MD in 7 days   Get CBC, CMP,  checked  by Primary MD next visit.    Activity: As tolerated with Full fall precautions use walker/cane & assistance as needed   Disposition Home    Diet: Heart Healthy  , with feeding assistance and aspiration precautions.  On your next visit with your primary care physician please Get Medicines reviewed and adjusted.   Please request your Prim.MD to go over all Hospital Tests and Procedure/Radiological results at the follow up, please get all Hospital records sent to your Prim MD by signing hospital release before you go home.   If you experience worsening of your admission symptoms, develop shortness of breath, life threatening emergency, suicidal or homicidal thoughts you must seek medical attention immediately by calling 911 or calling your MD immediately  if symptoms less severe.  You Must read complete instructions/literature along with all the possible adverse reactions/side effects for all the Medicines you take and that have been prescribed to you. Take any new Medicines after you have completely understood and accpet all the possible adverse reactions/side effects.   Do not drive, operating heavy machinery, perform activities at heights, swimming or participation in water activities or provide baby sitting services if your were admitted for syncope or siezures until you have seen by Primary MD or a Neurologist and advised to do so again.  Do not drive when taking Pain medications.    Do not take more than prescribed Pain, Sleep and Anxiety Medications  Special  Instructions: If you have smoked or chewed Tobacco  in the last 2 yrs please stop smoking, stop any regular Alcohol  and or any Recreational drug use.  Wear Seat belts while driving.   Please note  You were cared for by a hospitalist during your hospital stay. If you have any questions about your discharge medications or the care you received while you were in the hospital after you are discharged, you can call the unit and asked to speak with the hospitalist on call if the hospitalist that took  care of you is not available. Once you are discharged, your primary care physician will handle any further medical issues. Please note that NO REFILLS for any discharge medications will be authorized once you are discharged, as it is imperative that you return to your primary care physician (or establish a relationship with a primary care physician if you do not have one) for your aftercare needs so that they can reassess your need for medications and monitor your lab values.   Increase activity slowly   Complete by: As directed      Allergies as of 05/31/2020      Reactions   Levofloxacin Shortness Of Breath, Itching   WHELPS WHELPS WHELPS WHELPS   Penicillins Other (See Comments)   Stroke-like symptoms Has patient had a PCN reaction causing immediate rash, facial/tongue/throat swelling, SOB or lightheadedness with hypotension: YES Has patient had a PCN reaction causing severe rash involving mucus membranes or skin necrosis: No Has patient had a PCN reaction that required hospitalization: No Has patient had a PCN reaction occurring within the last 10 years: Yes If all of the above answers are "NO", then may proceed with Cephalosporin use.   Prednisone Shortness Of Breath, Nausea And Vomiting   Cephalexin Hives, Swelling, Rash   Latex Rash, Other (See Comments)   Causes skin to tear easily   Metoclopramide Hcl Hives, Rash      Medication List    STOP taking these medications   methadone 10  MG tablet Commonly known as: DOLOPHINE     TAKE these medications   alendronate 70 MG tablet Commonly known as: FOSAMAX Take 70 mg by mouth once a week.   ALPRAZolam 0.5 MG tablet Commonly known as: XANAX Take 0.5 mg by mouth 2 (two) times daily as needed.   aspirin EC 81 MG tablet Take 81 mg by mouth daily.   benzonatate 200 MG capsule Commonly known as: TESSALON Take 1 capsule by mouth 3 (three) times daily. For 7 days   clonazePAM 1 MG tablet Commonly known as: KLONOPIN Take 1 mg by mouth 2 (two) times daily as needed.   dicyclomine 10 MG capsule Commonly known as: BENTYL TAKE 1 CAPSULE BY MOUTH TWICE DAILY.   fluticasone 50 MCG/ACT nasal spray Commonly known as: FLONASE PLACE 2 SPRAYS INTO BOTH NOSTRILS DAILY   gabapentin 100 MG capsule Commonly known as: NEURONTIN Take 100 mg by mouth 3 (three) times daily.   HYDROmorphone 4 MG tablet Commonly known as: DILAUDID Take 4 mg by mouth every 4 (four) hours as needed for moderate pain.   ipratropium-albuterol 0.5-2.5 (3) MG/3ML Soln Commonly known as: DUONEB Take 3 mLs by nebulization every 6 (six) hours as needed.   levothyroxine 175 MCG tablet Commonly known as: SYNTHROID Take 175 mcg by mouth daily.   naloxone 4 MG/0.1ML Liqd nasal spray kit Commonly known as: Narcan Place 1 spray into the nose once for 1 dose.   pantoprazole 40 MG tablet Commonly known as: PROTONIX Take 40 mg by mouth 2 (two) times daily.   predniSONE 50 MG tablet Commonly known as: DELTASONE Start prednisone 50 mg oral daily for 3 days, then taper by 10 mg every 3 days till tapered off.   promethazine 25 MG tablet Commonly known as: PHENERGAN Take 1 tablet (25 mg total) by mouth daily as needed for nausea or vomiting.   sertraline 100 MG tablet Commonly known as: ZOLOFT Take 100 mg by mouth daily.   simvastatin 20 MG tablet Commonly known as:  ZOCOR Take 20 mg by mouth at bedtime.   tiZANidine 4 MG tablet Commonly known as:  ZANAFLEX Take 8 mg by mouth 3 (three) times daily.   traZODone 100 MG tablet Commonly known as: DESYREL Take 100-200 mg by mouth at bedtime as needed for sleep.            Durable Medical Equipment  (From admission, onward)         Start     Ordered   05/31/20 1405  For home use only DME Nebulizer machine  Once       Question Answer Comment  Patient needs a nebulizer to treat with the following condition Pulmonary fibrosis ([place])   Length of Need 6 Months      05/31/20 1405   05/30/20 1429  For home use only DME Nebulizer machine  Once       Question Answer Comment  Patient needs a nebulizer to treat with the following condition COPD (chronic obstructive pulmonary disease) ([place])   Length of Need Lifetime      05/30/20 1446            Diet and Activity recommendation: See Discharge Instructions above   Consults obtained - PCCM [place]   Major procedures and Radiology Reports - PLEASE review detailed and final reports for all details, in brief -      DG Chest 2 View  Result Date: 05/25/2020 CLINICAL DATA:  Shortness of breath EXAM: CHEST - 2 VIEW COMPARISON:  01/06/2019 FINDINGS: Chronic interstitial opacities. No definite new consolidation or edema. No pleural effusion. No pneumothorax. Cardiomediastinal contours are within normal limits. Residual contrast within the bowel. IVC filter is noted. Decreased osseous mineralization with age-indeterminate mid thoracic compression fractures. IMPRESSION: Interstitial prominence that may reflect chronic interstitial lung disease. No definite acute abnormality. Age-indeterminate mid thoracic compression fractures. Electronically Signed   By: Macy Mis M.D.   On: 05/25/2020 12:27   CT Angio Chest PE W and/or Wo Contrast  Result Date: 05/25/2020 CLINICAL DATA:  Chest pain or SOB, pleurisy or effusion suspected PE suspected, high prob EXAM: CT ANGIOGRAPHY CHEST WITH CONTRAST TECHNIQUE: Multidetector CT imaging of the  chest was performed using the standard protocol during bolus administration of intravenous contrast. Multiplanar CT image reconstructions and MIPs were obtained to evaluate the vascular anatomy. CONTRAST:  154m OMNIPAQUE IOHEXOL 350 MG/ML SOLN COMPARISON:  Radiograph earlier today. Chest CT 09/14/2011 reviewed. FINDINGS: Cardiovascular: There are no filling defects within the pulmonary arteries to suggest pulmonary embolus. Dilated main pulmonary artery at 4.2 cm. Mild cardiomegaly. Coronary artery calcifications. Mild aortic tortuosity without dissection or aneurysm. Mild aortic atherosclerosis. Mediastinum/Nodes: Prominent mediastinal nodes including a 10 mm prevascular node, series 6, image 36. There is bilateral hilar adenopathy. Largest lymph node on the right measures 16 mm, largest node on the left measures 10 mm. Thyroid gland not well-defined. Tiny hiatal hernia. Lungs/Pleura: Interstitial lung disease with subpleural reticulation, areas of interspersed ground-glass and septal thickening. Mild bronchiectasis in the right middle lobe. No frank honeycombing. No recent CT to assess for progression or change. There is no pleural fluid. Imaging obtained in expiration which limits assessment of the trachea and bronchi. No pneumothorax. Upper Abdomen: Assessed on concurrent abdominal CT, reported separately. Musculoskeletal: Exaggerated thoracic kyphosis. Compression fractures at T3, T5, and T7 which were assessed on thoracic spine CT last month, no interval change. L1 compression fracture with vertebral augmentation. No acute osseous abnormalities are seen. Review of the MIP images confirms the above findings.  IMPRESSION: 1. No pulmonary embolus. 2. Dilated main pulmonary artery consistent with pulmonary arterial hypertension. 3. Interstitial lung disease with subpleural reticulation, areas of interspersed ground-glass and septal thickening. No frank honeycombing. Consider follow-up high-resolution chest CT on a  nonemergent basis for further interstitial lung disease characterization. 4. Mediastinal and bilateral hilar adenopathy, likely reactive. 5. Coronary artery calcifications. 6. Multiple thoracic compression fractures, unchanged from thoracic spine CT last month at [facility] Aortic Atherosclerosis (ICD10-I70.0). Electronically Signed   By: Keith Rake M.D.   On: 05/25/2020 15:39   CT ABDOMEN PELVIS W CONTRAST  Result Date: 05/25/2020 CLINICAL DATA:  Abdominal distension.  Abdominal pain. EXAM: CT ABDOMEN AND PELVIS WITH CONTRAST TECHNIQUE: Multidetector CT imaging of the abdomen and pelvis was performed using the standard protocol following bolus administration of intravenous contrast. Delayed images are performed and associated with concurrent chest CT. CONTRAST:  165m OMNIPAQUE IOHEXOL 350 MG/ML SOLN COMPARISON:  None. FINDINGS: Lower chest: Assessed on concurrent chest CT, reported separately. Hepatobiliary: No focal liver abnormality is seen. Status post cholecystectomy. No biliary dilatation. Pancreas: Diffuse fatty atrophy. No ductal dilatation or inflammation. Spleen: Normal in size. Scattered calcified granuloma. There are 2 low-density lesions, largest measuring 2.2 cm in the anterior mid aspect. Adrenals/Urinary Tract: No adrenal nodule. No hydronephrosis or perinephric edema. Homogeneous renal enhancement. Absent renal excretion on delayed phase imaging. No renal calculi. Urinary bladder is partially distended without wall thickening. Stomach/Bowel: Unremarkable stomach. No small bowel obstruction or inflammation. High-density material/contrast is seen throughout the colon. There is a large volume of colonic stool, primarily in the right colon. Cecum is located in the deep pelvis. Appendix is not confidently visualized, no evidence of appendicitis. Transverse, descending, and sigmoid colon are tortuous. There is no colonic wall thickening or pericolonic edema. No significant diverticular  disease. Vascular/Lymphatic: IVC filter in place. Patent portal vein. Normal caliber abdominal aorta. No bulky abdominopelvic adenopathy. Reproductive: Status post hysterectomy. No adnexal masses. Other: No ascites or free air. No intra-abdominal fluid collection. No body wall hernia. Musculoskeletal: L1 compression fracture with vertebral augmentation. Posterior fusion L4-S1. There are no acute or suspicious osseous abnormalities. IMPRESSION: 1. Large volume of colonic stool, primarily in the right colon, can be seen with constipation. No bowel obstruction or inflammation. 2. Absent renal excretion on delayed phase imaging, suggest renal dysfunction. 3. Two low-density splenic lesions, largest measuring 2.2 cm. In the absence of known malignancy, this is likely benign. Aortic Atherosclerosis (ICD10-I70.0). Electronically Signed   By: MKeith RakeM.D.   On: 05/25/2020 15:32   DG Swallowing Func-Speech Pathology  Result Date: 05/27/2020 Objective Swallowing Evaluation: Type of Study: MBS-Modified Barium Swallow Study  Patient Details Name: SALAYASIA BREEDINGMRN: [id number] of Birth: 103/29/1957Today's Date: 05/27/2020 Time: SLP Start Time (ACUTE ONLY): 1400 -SLP Stop Time (ACUTE ONLY): 1420 SLP Time Calculation (min) (ACUTE ONLY): 20 min Past Medical History: Past Medical History: Diagnosis Date . Acute deep vein thrombosis (DVT) of left femoral vein ([place]  . Acute on chronic respiratory failure with hypoxia ([place]  . Anxiety  . ARDS (adult respiratory distress syndrome) ([place]  . Chronic LBP  . Chronic neck pain  . COPD (chronic obstructive pulmonary disease) ([place]  . Depression  . DM type 2 (diabetes mellitus, type 2) ([place]  . Dyspnea 05/25/2020 . Fibromyalgia  . GERD (gastroesophageal reflux disease)  . H/O pulmonary fibrosis  . H/O suicide attempt  . Hashimoto's thyroiditis  . Hypertension  . Hypothyroidism  . IBS (irritable bowel syndrome)  with constipation . Interstitial pneumonia ([place])  . Migraine  headache  . Osteoarthritis  . Seizure disorder ([place])  . 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 ABDOMINAL HYSTERECTOMY   . b/l foot surgery --bunions   . BIOPSY  07/29/2015  Procedure: BIOPSY;  Surgeon: Rogene Houston, MD;  [facility];  Service: Endoscopy;;  Duodenal,  gastric, and esophageal  biopsies . C-Spine fusion  1991, 2004 . CHOLECYSTECTOMY   . COLONOSCOPY WITH PROPOFOL N/A 07/29/2015  Procedure: COLONOSCOPY WITH PROPOFOL;  Surgeon: Rogene Houston, MD;  [facility];  Service: Endoscopy;  Laterality: N/A;  8:30 . ESOPHAGEAL DILATION N/A 07/29/2015  Procedure: ESOPHAGEAL DILATION;  Surgeon: Rogene Houston, MD;  [facility];  Service: Endoscopy;  Laterality: N/A; . ESOPHAGOGASTRODUODENOSCOPY (EGD) WITH PROPOFOL N/A 07/29/2015  Procedure: ESOPHAGOGASTRODUODENOSCOPY (EGD) WITH PROPOFOL;  Surgeon: Rogene Houston, MD;  [facility];  Service: Endoscopy;  Laterality: N/A; . l carpal tunnel release Right  . l spine fusion x 2   . reconstucted right ankle   . Right knee arthroscopic surgery   . total reconstruction of right ankle and heel    total of three  . VESICOVAGINAL FISTULA CLOSURE W/ TAH  1993  Fibroids no Ca HPI: Pt is a 65 y.o. female with medical history significant of pulmonary fibrosis, seizure disorder chronic respiratory failure, history of ARDS, chronic obstructive lung disease, fibromyalgia, GERD, hypothyroidism, essential hypertension, history of thyroiditis and diabetes who was just discharged on March 4 from [facility] where she was admitted with acute on chronic respiratory failure with pulmonary fibrosis and community-acquired pneumonia. Pt presented to the ED with SOB. CXR 3/9: Interstitial prominence that may reflect chronic interstitial lung disease. No definite acute abnormality. MBS 3/1 at [facility] was negative for aspiration, but revealed transient penetration secondary reduced laryngeal elevation, reduced anterior hyoid movement/excursion, and reduced laryngeal vestibule closure. A regular texture diet with thin liquids was recommended at that time and SLP services were discontinued. Esophagram 3/11: No abnormality identified on limited exam. No stricture or mass. No  gastroesophageal reflux demonstrated.  No data recorded Assessment / Plan / Recommendation CHL IP CLINICAL IMPRESSIONS 05/27/2020 Clinical Impression Pt's swallow function appears similar to that which was noted at [facility] on 3/1 with pharyngeal dysphagia characterized by reduced anterior laryngeal movement, reduced hyolaryngeal elevation, and reduced laryngeal closure secondary to incomplete epiglottic inversion. However, laryngeal invasion has worsed from PAS 2 to PAS 3 and it is anticipated that consistent aspiration would have been an eventuality if compensatory coughing was not intermittently prompted by the SLP. Coughing was also occasionally noted between swallows and it is possible that some instances of penetration did result in aspiration. No functional benefit was noted with a chin tuck posture or with a chin tuck posture which was paired with left head turn. However, a chin tuck posture with right head turn eliminated penetration. No instances of penetration or aspiration were noted with nectar thick liquids. 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 It is recommended that the pt's current diet be continued with strict observance of swallowing precautions. Pt has been on thickened liquids in the past and has indicated that she would rather use postural modifications than have nectar thick liquids. SLP will follow to assess diet tolerance and to determine need to diet modification pending compliance with swallowing precautions. SLP Visit Diagnosis  Dysphagia, pharyngeal phase (R13.13) Attention and concentration  deficit following -- Frontal lobe and executive function deficit following -- Impact on safety and function Mild aspiration risk   CHL IP TREATMENT RECOMMENDATION 05/27/2020 Treatment Recommendations Therapy as outlined in treatment plan below   Prognosis 05/27/2020 Prognosis for Safe Diet Advancement Good Barriers to Reach Goals -- Barriers/Prognosis Comment -- CHL IP DIET RECOMMENDATION 05/27/2020 SLP Diet Recommendations Regular solids;Thin liquid Liquid Administration via Cup;Straw Medication Administration Whole meds with puree Compensations Slow rate;Small sips/bites;Chin tuck Postural Changes Seated upright at 90 degrees;Remain semi-upright after after feeds/meals (Comment)   No flowsheet data found.  CHL IP FOLLOW UP RECOMMENDATIONS 05/27/2020 Follow up Recommendations Outpatient SLP   CHL IP FREQUENCY AND DURATION 05/27/2020 Speech Therapy Frequency (ACUTE ONLY) min 2x/week Treatment Duration 2 weeks      CHL IP ORAL PHASE 05/27/2020 Oral Phase WFL Oral - Pudding Teaspoon -- Oral - Pudding Cup -- Oral - Honey Teaspoon -- Oral - Honey Cup -- Oral - Nectar Teaspoon -- Oral - Nectar Cup -- Oral - Nectar Straw -- Oral - Thin Teaspoon -- Oral - Thin Cup -- Oral - Thin Straw -- Oral - Puree -- Oral - Mech Soft -- Oral - Regular -- Oral - Multi-Consistency -- Oral - Pill -- Oral Phase - Comment --  CHL IP PHARYNGEAL PHASE 05/27/2020 Pharyngeal Phase Impaired Pharyngeal- Pudding Teaspoon -- Pharyngeal -- Pharyngeal- Pudding Cup -- Pharyngeal -- Pharyngeal- Honey Teaspoon -- Pharyngeal -- Pharyngeal- Honey Cup -- Pharyngeal -- Pharyngeal- Nectar Teaspoon -- Pharyngeal -- Pharyngeal- Nectar Cup Reduced anterior laryngeal mobility;Reduced epiglottic inversion;Reduced laryngeal elevation;Reduced airway/laryngeal closure Pharyngeal -- Pharyngeal- Nectar Straw Reduced anterior laryngeal mobility;Reduced epiglottic inversion;Reduced laryngeal elevation;Reduced airway/laryngeal closure Pharyngeal -- Pharyngeal- Thin Teaspoon --  Pharyngeal -- Pharyngeal- Thin Cup Reduced anterior laryngeal mobility;Reduced epiglottic inversion;Reduced laryngeal elevation;Reduced airway/laryngeal closure;Penetration/Aspiration during swallow;Penetration/Apiration after swallow Pharyngeal Material enters airway, remains ABOVE vocal cords and not ejected out;Material enters airway, passes BELOW cords and not ejected out despite cough attempt by patient Pharyngeal- Thin Straw Reduced anterior laryngeal mobility;Reduced epiglottic inversion;Reduced laryngeal elevation;Reduced airway/laryngeal closure Pharyngeal -- Pharyngeal- Puree Reduced anterior laryngeal mobility;Reduced epiglottic inversion;Reduced laryngeal elevation;Reduced airway/laryngeal closure Pharyngeal -- Pharyngeal- Mechanical Soft -- Pharyngeal -- Pharyngeal- Regular Reduced anterior laryngeal mobility;Reduced epiglottic inversion;Reduced laryngeal elevation;Reduced airway/laryngeal closure Pharyngeal -- Pharyngeal- Multi-consistency -- Pharyngeal -- Pharyngeal- Pill Reduced anterior laryngeal mobility;Reduced epiglottic inversion;Reduced laryngeal elevation;Reduced airway/laryngeal closure Pharyngeal -- Pharyngeal Comment --  CHL IP CERVICAL ESOPHAGEAL PHASE 05/27/2020 Cervical Esophageal Phase Impaired Pudding Teaspoon -- Pudding Cup -- Honey Teaspoon -- Honey Cup -- Nectar Teaspoon -- Nectar Cup -- Nectar Straw -- Thin Teaspoon -- Thin Cup Prominent cricopharyngeal segment Thin Straw Prominent cricopharyngeal segment Puree Prominent cricopharyngeal segment Mechanical Soft -- Regular Prominent cricopharyngeal segment Multi-consistency -- Pill Prominent cricopharyngeal segment Cervical Esophageal Comment -- [name] Fitzpatrick, [place], [place] Office number [phone] Pager [phone] [place] 05/27/2020, 3:50 PM              DG ESOPHAGUS W SINGLE CM (SOL OR THIN BA)  Result Date: 05/27/2020 CLINICAL DATA:  Difficulty swallowing foods EXAM:  ESOPHOGRAM/BARIUM SWALLOW TECHNIQUE: Single contrast examination was performed using thin barium or water soluble. FLUOROSCOPY TIME:  Fluoroscopy Time:  54 seconds Radiation Exposure Index (if provided by the fluoroscopic device): 10.4 mGy Number of Acquired Spot Images: 1 COMPARISON:  None. FINDINGS: Limited exam due to patient immobility. Exam performed and semi supine positioning (approximately 30 degrees off horizontal). Normal swallowing function in the high  cervical esophagus. No stricture or mass in the thoracic esophagus or distal esophagus. There is some to and fro motion of the barium bolus which may be in part due to reclined positioning. No gastroesophageal reflux reflux demonstrated. IMPRESSION: No abnormality identified on limited exam. No stricture or mass. No gastroesophageal reflux demonstrated. Electronically Signed   By: Suzy Bouchard M.D.   On: 05/27/2020 14:17    Micro Results    Recent Results (from the past 240 hour(s))  Culture, blood (Routine x 2)     Status: None   Collection Time: 05/25/20 11:36 AM   Specimen: BLOOD  Result Value Ref Range Status   Specimen Description BLOOD RIGHT ANTECUBITAL  Final   Special Requests   Final    BOTTLES DRAWN AEROBIC ONLY Blood Culture results may not be optimal due to an inadequate volume of blood received in culture bottles   Culture   Final    NO GROWTH 5 DAYS Performed at [facility], [place] [address], [place], [place] [address]    Report Status 05/30/2020 FINAL  Final  Resp Panel by RT-PCR (Flu A&B, Covid) Nasopharyngeal Swab     Status: None   Collection Time: 05/25/20  1:05 PM   Specimen: Nasopharyngeal Swab; Nasopharyngeal(NP) swabs in vial transport medium  Result Value Ref Range Status   SARS Coronavirus 2 by RT PCR NEGATIVE NEGATIVE Final    Comment: (NOTE) SARS-CoV-2 target nucleic acids are NOT DETECTED.  The SARS-CoV-2 RNA is generally detectable in upper respiratory specimens during the acute phase of  infection. The lowest concentration of SARS-CoV-2 viral copies this assay can detect is 138 copies/mL. A negative result does not preclude SARS-Cov-2 infection and should not be used as the sole basis for treatment or other patient management decisions. A negative result may occur with  improper specimen collection/handling, submission of specimen other than nasopharyngeal swab, presence of viral mutation(s) within the areas targeted by this assay, and inadequate number of viral copies(<138 copies/mL). A negative result must be combined with clinical observations, patient history, and epidemiological information. The expected result is Negative.  Fact Sheet for Patients:  EntrepreneurPulse.com.au  Fact Sheet for Healthcare Providers:  IncredibleEmployment.be  This test is no t yet approved or cleared by the [place] FDA and  has been authorized for detection and/or diagnosis of SARS-CoV-2 by FDA under an Emergency Use Authorization (EUA). This EUA will remain  in effect (meaning this test can be used) for the duration of the COVID-19 declaration under Section 564(b)(1) of the Act, 21 U.S.C.section 360bbb-3(b)(1), unless the authorization is terminated  or revoked sooner.       Influenza A by PCR NEGATIVE NEGATIVE Final   Influenza B by PCR NEGATIVE NEGATIVE Final    Comment: (NOTE) The Xpert Xpress SARS-CoV-2/FLU/RSV plus assay is intended as an aid in the diagnosis of influenza from Nasopharyngeal swab specimens and should not be used as a sole basis for treatment. Nasal washings and aspirates are unacceptable for Xpert Xpress SARS-CoV-2/FLU/RSV testing.  Fact Sheet for Patients: EntrepreneurPulse.com.au  Fact Sheet for Healthcare Providers: IncredibleEmployment.be  This test is not yet approved or cleared by the [place] FDA and has been authorized for detection and/or diagnosis of SARS-CoV-2  by FDA under an Emergency Use Authorization (EUA). This EUA will remain in effect (meaning this test can be used) for the duration of the COVID-19 declaration under Section 564(b)(1) of the Act, 21 U.S.C. section 360bbb-3(b)(1), unless the authorization is terminated or revoked.  Performed at [facility], [place] [address], [place], [place] [address]   Culture, blood (Routine x 2)     Status: None   Collection Time: 05/25/20  1:06 PM   Specimen: BLOOD RIGHT HAND  Result Value Ref Range Status   Specimen Description BLOOD RIGHT HAND  Final   Special Requests   Final    BOTTLES DRAWN AEROBIC AND ANAEROBIC Blood Culture adequate volume   Culture   Final    NO GROWTH 5 DAYS Performed at [facility], [place] [address], [place], [place] [address]    Report Status 05/30/2020 FINAL  Final       Today   Subjective:   Azrielle Curnutt this morning has complaints of nausea, and dyspnea, discussed with staff, she appears to be at her baseline, she was able to ambulate to from the bathroom, with some dyspnea which is around her baseline , I have discussed with the patient that she will be discharged today, she tells me she does not feel ready, I have explained for her she she will always have dyspnea at baseline, [place] she is stable for discharge, she tells me she will appeal her discharge, I have informed her we will proceed with the discharge process.   Objective:   Blood pressure (!) 141/91, pulse 68, temperature 97.7 F (36.5 C), temperature source Axillary, resp. rate 18, height '5\' 2"'  (1.575 m), weight 71.4 kg, SpO2 94 %.   Intake/Output Summary (Last 24 hours) at 05/31/2020 1410 Last data filed at 05/31/2020 0344 Gross per 24 hour  Intake 50 ml  Output --  Net 50 ml    Exam Awake Alert, Oriented x 3, anxious Symmetrical Chest wall movement, Good air movement bilaterally, scattered  Rales and wheezing RRR,No Gallops,Rubs or new Murmurs, No Parasternal Heave +ve  B.Sounds, Abd Soft, Non tender, No organomegaly appriciated, No rebound -guarding or rigidity. No Cyanosis, Clubbing or edema, No new Rash or bruise  Data Review   CBC w Diff:  Lab Results  Component Value Date   WBC 8.9 05/29/2020   HGB 10.5 (L) 05/29/2020   HCT 32.4 (L) 05/29/2020   PLT 188 05/29/2020   LYMPHOPCT 17 05/25/2020   MONOPCT 7 05/25/2020   EOSPCT 4 05/25/2020   BASOPCT 0 05/25/2020    CMP:  Lab Results  Component Value Date   NA 137 05/29/2020   K 4.2 05/29/2020   CL 104 05/29/2020   CO2 30 05/29/2020   BUN 11 05/29/2020   CREATININE 0.54 05/29/2020   CREATININE 0.88 06/20/2015   PROT 5.8 (L) 05/26/2020   ALBUMIN 3.0 (L) 05/26/2020   BILITOT 0.6 05/26/2020   ALKPHOS 47 05/26/2020   AST 17 05/26/2020   ALT 16 05/26/2020  .   Total Time in preparing paper work, data evaluation and todays exam - 37 minutes  Phillips Climes M.D on 05/31/2020 at 2:10 PM  [facility]   Office  [phone]

## 2020-05-31 NOTE — Progress Notes (Signed)
PHARMACIST - PHYSICIAN COMMUNICATION ° °DR:   Elgergawy ° °CONCERNING: IV to Oral Route Change Policy ° °RECOMMENDATION: °This patient is receiving Protonix by the intravenous route.  Based on criteria approved by the Pharmacy and Therapeutics Committee, the intravenous medication(s) is/are being converted to the equivalent oral dose form(s). ° ° °DESCRIPTION: °These criteria include: °The patient is eating (either orally or via tube) and/or has been taking other orally administered medications for a least 24 hours °The patient has no evidence of active gastrointestinal bleeding or impaired GI absorption (gastrectomy, short bowel, patient on TNA or NPO). ° °If you have questions about this conversion, please contact the Pharmacy Department  °[]  ( [phone] )  [place] °[]  ( [phone] )  [facility] °[x]  ( [phone] )  [place] °[]  ( [phone] )  [facility] °[]  ( [phone] )  [facility]  ° ° ° °

## 2020-05-31 NOTE — Progress Notes (Signed)
RT note. Pt. Found off BIPAP at this time, taken off by NT due to HA/nausea. Pt placed back on 2.5 [place], VSS at this time,. RT will continue to monitor.

## 2020-05-31 NOTE — Progress Notes (Signed)
OT Cancellation Note  Patient Details Name: [name] Fitzpatrick MRN: [id number] DOB: August 17, 1955   Cancelled Treatment:    Reason Eval/Treat Not Completed: OT screened, no needs identified, will sign off (Pt refused OT eval at this time stating "I get up and do my own bath everyday. It takes me a while, but i do it. I don't think that I need therapy though." Pt aware of energy conservation techniques. Pt left with needs in reach and bath supplies.) OT signing off at this time due to patient declining OT very politely.  Jefferey Pica, OTR/L Acute Rehabilitation Services Pager: [phone] Office: [phone]   Jefferey Pica 05/31/2020, 11:32 AM

## 2020-05-31 NOTE — Discharge Instructions (Signed)
Follow with Primary MD Lilian Coma., MD in 7 days   Get CBC, CMP,  checked  by Primary MD next visit.    Activity: As tolerated with Full fall precautions use walker/cane & assistance as needed   Disposition Home    Diet: Heart Healthy  , with feeding assistance and aspiration precautions.  On your next visit with your primary care physician please Get Medicines reviewed and adjusted.   Please request your Prim.MD to go over all Hospital Tests and Procedure/Radiological results at the follow up, please get all Hospital records sent to your Prim MD by signing hospital release before you go home.   If you experience worsening of your admission symptoms, develop shortness of breath, life threatening emergency, suicidal or homicidal thoughts you must seek medical attention immediately by calling 911 or calling your MD immediately  if symptoms less severe.  You Must read complete instructions/literature along with all the possible adverse reactions/side effects for all the Medicines you take and that have been prescribed to you. Take any new Medicines after you have completely understood and accpet all the possible adverse reactions/side effects.   Do not drive, operating heavy machinery, perform activities at heights, swimming or participation in water activities or provide baby sitting services if your were admitted for syncope or siezures until you have seen by Primary MD or a Neurologist and advised to do so again.  Do not drive when taking Pain medications.    Do not take more than prescribed Pain, Sleep and Anxiety Medications  Special Instructions: If you have smoked or chewed Tobacco  in the last 2 yrs please stop smoking, stop any regular Alcohol  and or any Recreational drug use.  Wear Seat belts while driving.   Please note  You were cared for by a hospitalist during your hospital stay. If you have any questions about your discharge medications or the care you received  while you were in the hospital after you are discharged, you can call the unit and asked to speak with the hospitalist on call if the hospitalist that took care of you is not available. Once you are discharged, your primary care physician will handle any further medical issues. Please note that NO REFILLS for any discharge medications will be authorized once you are discharged, as it is imperative that you return to your primary care physician (or establish a relationship with a primary care physician if you do not have one) for your aftercare needs so that they can reassess your need for medications and monitor your lab values.

## 2020-05-31 NOTE — Progress Notes (Signed)
Speech Language Pathology Treatment: Dysphagia  Patient Details Name: [name] Fitzpatrick MRN: [id number] DOB: 11-Oct-1955 Today's Date: 05/31/2020 Time: 7829-5621 SLP Time Calculation (min) (ACUTE ONLY): 10 min  Assessment / Plan / Recommendation Clinical Impression  Pt eating lunch upon arrival. Presents with increased WOB - encouraged to take breaks during meals in order to catch her breath. She was able to verbalize and carry-out recommendations for chin tuck/right head turn to facilitate airway protection.  She stated that this posture has helped, although she describes occasional incidents of likely aspiration.  Encouraged her to continue to use this strategy when drinking liquids.  [name] agreed.  She is preparing for D/C home this afternoon.  No further SLP needs are identified - our service will sign off.   HPI HPI: Pt is a 65 y.o. female with medical history significant of pulmonary fibrosis, seizure disorder chronic respiratory failure, history of ARDS, chronic obstructive lung disease, fibromyalgia, GERD, hypothyroidism, essential hypertension, history of thyroiditis and diabetes who was just discharged on March 4 from [facility] where she was admitted with acute on chronic respiratory failure with pulmonary fibrosis and community-acquired pneumonia. Pt presented to the ED with SOB. CXR 3/9: Interstitial prominence that may reflect chronic interstitial lung disease. No definite acute abnormality. MBS 3/1 at [facility] was negative for aspiration, but revealed transient penetration secondary reduced laryngeal elevation, reduced anterior hyoid movement/excursion, and reduced laryngeal vestibule closure. A regular texture diet with thin liquids was recommended at that time and SLP services were discontinued. Esophagram 3/11: No abnormality identified on limited exam. No stricture or mass. No  gastroesophageal reflux demonstrated.      SLP Plan   Discharge SLP treatment due to (comment)       Recommendations  Diet recommendations: Regular;Thin liquid Liquids provided via: Cup;Straw Medication Administration: Whole meds with puree Supervision: Patient able to self feed Compensations: Slow rate;Small sips/bites;Chin tuck (and head turn to right) Postural Changes and/or Swallow Maneuvers: Seated upright 90 degrees                Oral Care Recommendations: Oral care BID Follow up Recommendations: None SLP Visit Diagnosis: Dysphagia, pharyngeal phase (R13.13) Plan: Discharge SLP treatment due to (comment)       GO              Brittay Mogle L. Samson Frederic, MA CCC/SLP Acute Rehabilitation Services Office number [phone] Pager [phone]   Blenda Mounts Laurice 05/31/2020, 2:13 PM

## 2020-05-31 NOTE — Progress Notes (Signed)
RT note. Pt. Placed on BIPAP at this time 10/5 R10 40%. VSS, RT will continue to monitor.

## 2020-05-31 NOTE — Progress Notes (Signed)
PT Cancellation Note  Patient Details Name: [name] Fitzpatrick MRN: [id number] DOB: 1955/04/14   Cancelled Treatment:    Reason Eval/Treat Not Completed: Fatigue/lethargy limiting ability to participate. Patient also states her husband is coming to have discussion about palliative care. Will re-attempt at later time/day.     Latash Nouri 05/31/2020, 3:08 PM

## 2020-05-31 NOTE — TOC Transition Note (Signed)
Transition of Care [facility]) - CM/SW Discharge Note   Patient Details  Name: RAMIYA DELAHUNTY MRN: [id number] Date of Birth: [birth year]  [facility] CM/SW Contact:  Angelita Ingles, RN Phone Number: [phone]  05/31/2020, 3:50 PM   Clinical Narrative:    Patient discharging home. Home health services to resume with [facility]. Patient was active prior to admission. Patient requesting portable tank for discharge. [facility] has been made aware Shirlean Mylar). Palliative outpatient consult has been initiated. No further needs are noted at this time TOC will sign off.    Final next level of care: Home w Home Health Services Barriers to Discharge: No Barriers Identified   Patient Goals and CMS Choice   CMS Medicare.gov Compare Post Acute Care list provided to:: Patient Choice offered to / list presented to : Patient  Discharge Placement                       Discharge Plan and Services                DME Arranged: N/A (portable O2 tank to be delivered) DME Agency: NA       HH Arranged: NA [place] Agency: [place] ([facility]) (Active with [place]) Date [place]: 05/31/20 Time [place]: [place] Representative spoke with at [place]: [place] Determinants of Health ([place]) Interventions     Readmission Risk Interventions No flowsheet data found.

## 2020-05-31 NOTE — Progress Notes (Signed)
[name] Fitzpatrick, is a 65 y.o. female, DOB - June 16, 1955, PPJ:[id number]  65 y.o.femalewith medical history significant ofpulmonary fibrosis, seizure disorder chronic respiratory failure, history of ARDS, chronic obstructive lung disease chronically on 2-4 L nasal cannula oxygen at home, fibromyalgia, chronic pain syndrome on narcotics, GERD, hypothyroidism, essential hypertension, history of thyroiditis and diabetes who was just discharged on March 4 from [facility] where she was admitted with acute on chronic respiratory failure with pulmonary fibrosis and community-acquired pneumonia.   After a few days of discharge she developed shortness of breath and was readmitted to [facility] this time diagnosed with acute on chronic hypoxic respiratory failure due to aspiration pneumonia on top of her baseline ILD and COPD with 2-4 L baseline nasal cannula oxygen.  She has been treated here with antibiotics, steroids, she has been seen by pulmonary who have deemed that she is close to her baseline and medically optimized.  Her current attending physician has decided that she should be discharged today, patient wishes a second opinion for which I have seen her today.  Upon arrival to the room patient was resting comfortably in bed in half-asleep, she was in no distress, she was on 1/2 L of nasal cannula oxygen with a pulse ox in mid 90s, she was in no respiratory distress or any other discomfort whatsoever.  However she states she does not feel like she can go home today and wants to appeal the discharge process which I told her that is her right to do.  However I told her medically it seems to me that she is appropriate for discharge, complaining always getting very anxious and was nauseated earlier this morning for, she agrees that her nausea has now resolved well.  However she feels  anxious going home.  This time I am opinion medically optimized to be discharged home, she unfortunately has a poor baseline for due to her underlying ILD/COPD requiring between 2 and 4 L of nasal cannula oxygen chronically, chronic pain syndrome on high-dose narcotics along with anxiety disorder.     Vitals:   05/31/20 0033 05/31/20 0351 05/31/20 0505 05/31/20 0711  BP:   (!) 141/91   Pulse:   68   Resp:   20 18  Temp:   97.7 F (36.5 C)   TempSrc:   Axillary   SpO2: 96% 94% 94%   Weight:      Height:            Data Review   Micro Results Recent Results (from the past 240 hour(s))  Culture, blood (Routine x 2)     Status: None   Collection Time: 05/25/20 11:36 AM   Specimen: BLOOD  Result Value Ref Range Status   Specimen Description BLOOD RIGHT ANTECUBITAL  Final   Special Requests   Final    BOTTLES DRAWN AEROBIC ONLY Blood Culture results may not be optimal due to an inadequate volume of blood received in culture bottles   Culture   Final    NO GROWTH 5 DAYS Performed at [facility], [place] [address] [place], [place] [address]    Report Status 05/30/2020 FINAL  Final  Resp Panel by RT-PCR (Flu A&B, Covid) Nasopharyngeal Swab     Status: None   Collection Time: 05/25/20  1:05 PM   Specimen: Nasopharyngeal Swab; Nasopharyngeal(NP) swabs in vial transport medium  Result Value Ref Range Status   SARS Coronavirus 2 by RT PCR NEGATIVE NEGATIVE Final  Comment: (NOTE) SARS-CoV-2 target nucleic acids are NOT DETECTED.  The SARS-CoV-2 RNA is generally detectable in upper respiratory specimens during the acute phase of infection. The lowest concentration of SARS-CoV-2 viral copies this assay can detect is 138 copies/mL. A negative result does not preclude SARS-Cov-2 infection and should not be used as the sole basis for treatment or other patient management decisions. A negative result may occur with  improper specimen collection/handling, submission of specimen  other than nasopharyngeal swab, presence of viral mutation(s) within the areas targeted by this assay, and inadequate number of viral copies(<138 copies/mL). A negative result must be combined with clinical observations, patient history, and epidemiological information. The expected result is Negative.  Fact Sheet for Patients:  EntrepreneurPulse.com.au  Fact Sheet for Healthcare Providers:  IncredibleEmployment.be  This test is no t yet approved or cleared by the [place] FDA and  has been authorized for detection and/or diagnosis of SARS-CoV-2 by FDA under an Emergency Use Authorization (EUA). This EUA will remain  in effect (meaning this test can be used) for the duration of the COVID-19 declaration under Section 564(b)(1) of the Act, 21 U.S.C.section 360bbb-3(b)(1), unless the authorization is terminated  or revoked sooner.       Influenza A by PCR NEGATIVE NEGATIVE Final   Influenza B by PCR NEGATIVE NEGATIVE Final    Comment: (NOTE) The Xpert Xpress SARS-CoV-2/FLU/RSV plus assay is intended as an aid in the diagnosis of influenza from Nasopharyngeal swab specimens and should not be used as a sole basis for treatment. Nasal washings and aspirates are unacceptable for Xpert Xpress SARS-CoV-2/FLU/RSV testing.  Fact Sheet for Patients: EntrepreneurPulse.com.au  Fact Sheet for Healthcare Providers: IncredibleEmployment.be  This test is not yet approved or cleared by the [place] FDA and has been authorized for detection and/or diagnosis of SARS-CoV-2 by FDA under an Emergency Use Authorization (EUA). This EUA will remain in effect (meaning this test can be used) for the duration of the COVID-19 declaration under Section 564(b)(1) of the Act, 21 U.S.C. section 360bbb-3(b)(1), unless the authorization is terminated or revoked.  Performed at [facility], [place] [address] [place],  [place] [address]   Culture, blood (Routine x 2)     Status: None   Collection Time: 05/25/20  1:06 PM   Specimen: BLOOD RIGHT HAND  Result Value Ref Range Status   Specimen Description BLOOD RIGHT HAND  Final   Special Requests   Final    BOTTLES DRAWN AEROBIC AND ANAEROBIC Blood Culture adequate volume   Culture   Final    NO GROWTH 5 DAYS Performed at [facility], [place] [address], [place], [place] [address]    Report Status 05/30/2020 FINAL  Final    Radiology Reports DG Chest 2 View  Result Date: 05/25/2020 CLINICAL DATA:  Shortness of breath EXAM: CHEST - 2 VIEW COMPARISON:  01/06/2019 FINDINGS: Chronic interstitial opacities. No definite new consolidation or edema. No pleural effusion. No pneumothorax. Cardiomediastinal contours are within normal limits. Residual contrast within the bowel. IVC filter is noted. Decreased osseous mineralization with age-indeterminate mid thoracic compression fractures. IMPRESSION: Interstitial prominence that may reflect chronic interstitial lung disease. No definite acute abnormality. Age-indeterminate mid thoracic compression fractures. Electronically Signed   By: Macy Mis M.D.   On: 05/25/2020 12:27   CT Angio Chest PE W and/or Wo Contrast  Result Date: 05/25/2020 CLINICAL DATA:  Chest pain or SOB, pleurisy or effusion suspected PE suspected, high prob EXAM: CT ANGIOGRAPHY CHEST WITH  CONTRAST TECHNIQUE: Multidetector CT imaging of the chest was performed using the standard protocol during bolus administration of intravenous contrast. Multiplanar CT image reconstructions and MIPs were obtained to evaluate the vascular anatomy. CONTRAST:  140mL OMNIPAQUE IOHEXOL 350 MG/ML SOLN COMPARISON:  Radiograph earlier today. Chest CT 09/14/2011 reviewed. FINDINGS: Cardiovascular: There are no filling defects within the pulmonary arteries to suggest pulmonary embolus. Dilated main pulmonary artery at 4.2 cm. Mild cardiomegaly. Coronary artery calcifications. Mild  aortic tortuosity without dissection or aneurysm. Mild aortic atherosclerosis. Mediastinum/Nodes: Prominent mediastinal nodes including a 10 mm prevascular node, series 6, image 36. There is bilateral hilar adenopathy. Largest lymph node on the right measures 16 mm, largest node on the left measures 10 mm. Thyroid gland not well-defined. Tiny hiatal hernia. Lungs/Pleura: Interstitial lung disease with subpleural reticulation, areas of interspersed ground-glass and septal thickening. Mild bronchiectasis in the right middle lobe. No frank honeycombing. No recent CT to assess for progression or change. There is no pleural fluid. Imaging obtained in expiration which limits assessment of the trachea and bronchi. No pneumothorax. Upper Abdomen: Assessed on concurrent abdominal CT, reported separately. Musculoskeletal: Exaggerated thoracic kyphosis. Compression fractures at T3, T5, and T7 which were assessed on thoracic spine CT last month, no interval change. L1 compression fracture with vertebral augmentation. No acute osseous abnormalities are seen. Review of the MIP images confirms the above findings. IMPRESSION: 1. No pulmonary embolus. 2. Dilated main pulmonary artery consistent with pulmonary arterial hypertension. 3. Interstitial lung disease with subpleural reticulation, areas of interspersed ground-glass and septal thickening. No frank honeycombing. Consider follow-up high-resolution chest CT on a nonemergent basis for further interstitial lung disease characterization. 4. Mediastinal and bilateral hilar adenopathy, likely reactive. 5. Coronary artery calcifications. 6. Multiple thoracic compression fractures, unchanged from thoracic spine CT last month at [facility]. Aortic Atherosclerosis (ICD10-I70.0). Electronically Signed   By: Keith Rake M.D.   On: 05/25/2020 15:39   CT ABDOMEN PELVIS W CONTRAST  Result Date: 05/25/2020 CLINICAL DATA:  Abdominal distension.  Abdominal pain. EXAM: CT ABDOMEN AND  PELVIS WITH CONTRAST TECHNIQUE: Multidetector CT imaging of the abdomen and pelvis was performed using the standard protocol following bolus administration of intravenous contrast. Delayed images are performed and associated with concurrent chest CT. CONTRAST:  12mL OMNIPAQUE IOHEXOL 350 MG/ML SOLN COMPARISON:  None. FINDINGS: Lower chest: Assessed on concurrent chest CT, reported separately. Hepatobiliary: No focal liver abnormality is seen. Status post cholecystectomy. No biliary dilatation. Pancreas: Diffuse fatty atrophy. No ductal dilatation or inflammation. Spleen: Normal in size. Scattered calcified granuloma. There are 2 low-density lesions, largest measuring 2.2 cm in the anterior mid aspect. Adrenals/Urinary Tract: No adrenal nodule. No hydronephrosis or perinephric edema. Homogeneous renal enhancement. Absent renal excretion on delayed phase imaging. No renal calculi. Urinary bladder is partially distended without wall thickening. Stomach/Bowel: Unremarkable stomach. No small bowel obstruction or inflammation. High-density material/contrast is seen throughout the colon. There is a large volume of colonic stool, primarily in the right colon. Cecum is located in the deep pelvis. Appendix is not confidently visualized, no evidence of appendicitis. Transverse, descending, and sigmoid colon are tortuous. There is no colonic wall thickening or pericolonic edema. No significant diverticular disease. Vascular/Lymphatic: IVC filter in place. Patent portal vein. Normal caliber abdominal aorta. No bulky abdominopelvic adenopathy. Reproductive: Status post hysterectomy. No adnexal masses. Other: No ascites or free air. No intra-abdominal fluid collection. No body wall hernia. Musculoskeletal: L1 compression fracture with vertebral augmentation. Posterior fusion L4-S1. There are no acute or suspicious osseous abnormalities.  IMPRESSION: 1. Large volume of colonic stool, primarily in the right colon, can be seen with  constipation. No bowel obstruction or inflammation. 2. Absent renal excretion on delayed phase imaging, suggest renal dysfunction. 3. Two low-density splenic lesions, largest measuring 2.2 cm. In the absence of known malignancy, this is likely benign. Aortic Atherosclerosis (ICD10-I70.0). Electronically Signed   By: Keith Rake M.D.   On: 05/25/2020 15:32   DG Swallowing Func-Speech Pathology  Result Date: 05/27/2020 Objective Swallowing Evaluation: Type of Study: MBS-Modified Barium Swallow Study  Patient Details Name: [name] Fitzpatrick MRN: [id number] Date of Birth: 06/24/1955 Today's Date: 05/27/2020 Time: SLP Start Time (ACUTE ONLY): 1400 -SLP Stop Time (ACUTE ONLY): 1420 SLP Time Calculation (min) (ACUTE ONLY): 20 min Past Medical History: Past Medical History: Diagnosis Date . Acute deep vein thrombosis (DVT) of left femoral vein ([place])  . Acute on chronic respiratory failure with hypoxia ([place])  . Anxiety  . ARDS (adult respiratory distress syndrome) ([place])  . Chronic LBP  . Chronic neck pain  . COPD (chronic obstructive pulmonary disease) ([place])  . Depression  . DM type 2 (diabetes mellitus, type 2) ([place])  . Dyspnea 05/25/2020 . Fibromyalgia  . GERD (gastroesophageal reflux disease)  . H/O pulmonary fibrosis  . H/O suicide attempt  . Hashimoto's thyroiditis  . Hypertension  . Hypothyroidism  . IBS (irritable bowel syndrome)   with constipation . Interstitial pneumonia ([place])  . Migraine headache  . Osteoarthritis  . Seizure disorder ([place])  . 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 ABDOMINAL HYSTERECTOMY   . b/l foot surgery --bunions   . BIOPSY  07/29/2015  Procedure: BIOPSY;  Surgeon: Rogene Houston, MD;  Location: [facility];  Service: Endoscopy;;  Duodenal,  gastric, and esophageal  biopsies . C-Spine fusion  1991, 2004 . CHOLECYSTECTOMY   . COLONOSCOPY WITH PROPOFOL N/A 07/29/2015   Procedure: COLONOSCOPY WITH PROPOFOL;  Surgeon: Rogene Houston, MD;  Location: [facility];  Service: Endoscopy;  Laterality: N/A;  8:30 . ESOPHAGEAL DILATION N/A 07/29/2015  Procedure: ESOPHAGEAL DILATION;  Surgeon: Rogene Houston, MD;  Location: [facility];  Service: Endoscopy;  Laterality: N/A; . ESOPHAGOGASTRODUODENOSCOPY (EGD) WITH PROPOFOL N/A 07/29/2015  Procedure: ESOPHAGOGASTRODUODENOSCOPY (EGD) WITH PROPOFOL;  Surgeon: Rogene Houston, MD;  Location: [facility];  Service: Endoscopy;  Laterality: N/A; . l carpal tunnel release Right  . l spine fusion x 2   . reconstucted right ankle   . Right knee arthroscopic surgery   . total reconstruction of right ankle and heel    total of three  . VESICOVAGINAL FISTULA CLOSURE W/ TAH  1993  Fibroids no Ca HPI: Pt is a 65 y.o. female with medical history significant of pulmonary fibrosis, seizure disorder chronic respiratory failure, history of ARDS, chronic obstructive lung disease, fibromyalgia, GERD, hypothyroidism, essential hypertension, history of thyroiditis and diabetes who was just discharged on March 4 from [facility] where she was admitted with acute on chronic respiratory failure with pulmonary fibrosis and community-acquired pneumonia. Pt presented to the ED with SOB. CXR 3/9: Interstitial prominence that may reflect chronic interstitial lung disease. No definite acute abnormality. MBS 3/1 at [facility] was negative for aspiration, but revealed transient penetration secondary reduced laryngeal elevation, reduced anterior hyoid movement/excursion, and reduced laryngeal vestibule closure. A regular texture diet with thin liquids was recommended at that time and SLP services were discontinued. Esophagram  3/11: No abnormality identified on limited exam. No stricture or mass. No  gastroesophageal reflux demonstrated.  No data recorded Assessment / Plan / Recommendation CHL IP CLINICAL IMPRESSIONS 05/27/2020  Clinical Impression Pt's swallow function appears similar to that which was noted at [facility] on 3/1 with pharyngeal dysphagia characterized by reduced anterior laryngeal movement, reduced hyolaryngeal elevation, and reduced laryngeal closure secondary to incomplete epiglottic inversion. However, laryngeal invasion has worsed from PAS 2 to PAS 3 and it is anticipated that consistent aspiration would have been an eventuality if compensatory coughing was not intermittently prompted by the SLP. Coughing was also occasionally noted between swallows and it is possible that some instances of penetration did result in aspiration. No functional benefit was noted with a chin tuck posture or with a chin tuck posture which was paired with left head turn. However, a chin tuck posture with right head turn eliminated penetration. No instances of penetration or aspiration were noted with nectar thick liquids. 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 It is recommended that the pt's current diet be continued with strict observance of swallowing precautions. Pt has been on thickened liquids in the past and has indicated that she would rather use postural modifications than have nectar thick liquids. SLP will follow to assess diet tolerance and to determine need to diet modification pending compliance with swallowing precautions. SLP Visit Diagnosis Dysphagia, pharyngeal phase (R13.13) Attention and concentration deficit following -- Frontal lobe and executive function deficit following -- Impact on safety and function Mild aspiration risk   CHL IP TREATMENT RECOMMENDATION 05/27/2020 Treatment Recommendations Therapy as outlined in treatment plan below   Prognosis 05/27/2020 Prognosis for Safe Diet Advancement Good Barriers to Reach Goals -- Barriers/Prognosis Comment -- CHL IP DIET RECOMMENDATION 05/27/2020 SLP Diet Recommendations Regular solids;Thin liquid Liquid Administration via Cup;Straw Medication Administration Whole  meds with puree Compensations Slow rate;Small sips/bites;Chin tuck Postural Changes Seated upright at 90 degrees;Remain semi-upright after after feeds/meals (Comment)   No flowsheet data found.  CHL IP FOLLOW UP RECOMMENDATIONS 05/27/2020 Follow up Recommendations Outpatient SLP   CHL IP FREQUENCY AND DURATION 05/27/2020 Speech Therapy Frequency (ACUTE ONLY) min 2x/week Treatment Duration 2 weeks      CHL IP ORAL PHASE 05/27/2020 Oral Phase WFL Oral - Pudding Teaspoon -- Oral - Pudding Cup -- Oral - Honey Teaspoon -- Oral - Honey Cup -- Oral - Nectar Teaspoon -- Oral - Nectar Cup -- Oral - Nectar Straw -- Oral - Thin Teaspoon -- Oral - Thin Cup -- Oral - Thin Straw -- Oral - Puree -- Oral - Mech Soft -- Oral - Regular -- Oral - Multi-Consistency -- Oral - Pill -- Oral Phase - Comment --  CHL IP PHARYNGEAL PHASE 05/27/2020 Pharyngeal Phase Impaired Pharyngeal- Pudding Teaspoon -- Pharyngeal -- Pharyngeal- Pudding Cup -- Pharyngeal -- Pharyngeal- Honey Teaspoon -- Pharyngeal -- Pharyngeal- Honey Cup -- Pharyngeal -- Pharyngeal- Nectar Teaspoon -- Pharyngeal -- Pharyngeal- Nectar Cup Reduced anterior laryngeal mobility;Reduced epiglottic inversion;Reduced laryngeal elevation;Reduced airway/laryngeal closure Pharyngeal -- Pharyngeal- Nectar Straw Reduced anterior laryngeal mobility;Reduced epiglottic inversion;Reduced laryngeal elevation;Reduced airway/laryngeal closure Pharyngeal -- Pharyngeal- Thin Teaspoon -- Pharyngeal -- Pharyngeal- Thin Cup Reduced anterior laryngeal mobility;Reduced epiglottic inversion;Reduced laryngeal elevation;Reduced airway/laryngeal closure;Penetration/Aspiration during swallow;Penetration/Apiration after swallow Pharyngeal Material enters airway, remains ABOVE vocal cords and not ejected out;Material enters airway, passes BELOW cords and not ejected out despite cough attempt by patient Pharyngeal- Thin Straw Reduced anterior laryngeal mobility;Reduced epiglottic inversion;Reduced laryngeal  elevation;Reduced airway/laryngeal closure Pharyngeal -- Pharyngeal- Puree Reduced anterior  laryngeal mobility;Reduced epiglottic inversion;Reduced laryngeal elevation;Reduced airway/laryngeal closure Pharyngeal -- Pharyngeal- Mechanical Soft -- Pharyngeal -- Pharyngeal- Regular Reduced anterior laryngeal mobility;Reduced epiglottic inversion;Reduced laryngeal elevation;Reduced airway/laryngeal closure Pharyngeal -- Pharyngeal- Multi-consistency -- Pharyngeal -- Pharyngeal- Pill Reduced anterior laryngeal mobility;Reduced epiglottic inversion;Reduced laryngeal elevation;Reduced airway/laryngeal closure Pharyngeal -- Pharyngeal Comment --  CHL IP CERVICAL ESOPHAGEAL PHASE 05/27/2020 Cervical Esophageal Phase Impaired Pudding Teaspoon -- Pudding Cup -- Honey Teaspoon -- Honey Cup -- Nectar Teaspoon -- Nectar Cup -- Nectar Straw -- Thin Teaspoon -- Thin Cup Prominent cricopharyngeal segment Thin Straw Prominent cricopharyngeal segment Puree Prominent cricopharyngeal segment Mechanical Soft -- Regular Prominent cricopharyngeal segment Multi-consistency -- Pill Prominent cricopharyngeal segment Cervical Esophageal Comment -- Shanika I. Hardin Negus, [place], [place] Office number [phone] Pager [phone] Horton Marshall 05/27/2020, 3:50 PM              DG ESOPHAGUS W SINGLE CM (SOL OR THIN BA)  Result Date: 05/27/2020 CLINICAL DATA:  Difficulty swallowing foods EXAM: ESOPHOGRAM/BARIUM SWALLOW TECHNIQUE: Single contrast examination was performed using thin barium or water soluble. FLUOROSCOPY TIME:  Fluoroscopy Time:  54 seconds Radiation Exposure Index (if provided by the fluoroscopic device): 10.4 mGy Number of Acquired Spot Images: 1 COMPARISON:  None. FINDINGS: Limited exam due to patient immobility. Exam performed and semi supine positioning (approximately 30 degrees off horizontal). Normal swallowing function in the high cervical esophagus. No stricture or mass in the thoracic  esophagus or distal esophagus. There is some to and fro motion of the barium bolus which may be in part due to reclined positioning. No gastroesophageal reflux reflux demonstrated. IMPRESSION: No abnormality identified on limited exam. No stricture or mass. No gastroesophageal reflux demonstrated. Electronically Signed   By: Suzy Bouchard M.D.   On: 05/27/2020 14:17    CBC Recent Labs  Lab 05/25/20 1129 05/25/20 1315 05/25/20 2211 05/26/20 0234 05/27/20 0240 05/29/20 0449  WBC 11.8*  --  8.7 10.2 10.0 8.9  HGB 10.8* 10.2* 11.7* 11.2* 10.0* 10.5*  HCT 35.1* 30.0* 37.5 34.9* 30.8* 32.4*  PLT 219  --  178 183 170 188  MCV 92.6  --  91.5 89.7 88.8 89.0  MCH 28.5  --  28.5 28.8 28.8 28.8  MCHC 30.8  --  31.2 32.1 32.5 32.4  RDW 14.8  --  14.6 14.4 14.6 14.8  LYMPHSABS 2.1  --   --   --   --   --   MONOABS 0.8  --   --   --   --   --   EOSABS 0.5  --   --   --   --   --   BASOSABS 0.0  --   --   --   --   --     Chemistries  Recent Labs  Lab 05/25/20 1129 05/25/20 1315 05/25/20 2211 05/26/20 0234 05/27/20 0240 05/29/20 0449  NA 137 135  --  137 137 137  K 3.9 4.1  --  3.6 4.2 4.2  CL 100  --   --  101 104 104  CO2 28  --   --  29 26 30   GLUCOSE 103*  --   --  189* 148* 97  BUN 17  --   --  14 11 11   CREATININE 0.83  --  0.78 0.71 0.56 0.54  CALCIUM 9.4  --   --  8.8* 8.8* 8.5*  AST 17  --   --  17  --   --  ALT 16  --   --  16  --   --   ALKPHOS 41  --   --  47  --   --   BILITOT 0.4  --   --  0.6  --   --    ------------------------------------------------------------------------------------------------------------------ estimated creatinine clearance is 65.7 mL/min (by C-G formula based on SCr of 0.54 mg/dL). ------------------------------------------------------------------------------------------------------------------ No results for input(s): HGBA1C in the last 72  hours. ------------------------------------------------------------------------------------------------------------------ No results for input(s): CHOL, HDL, LDLCALC, TRIG, CHOLHDL, LDLDIRECT in the last 72 hours. ------------------------------------------------------------------------------------------------------------------ No results for input(s): TSH, T4TOTAL, T3FREE, THYROIDAB in the last 72 hours.  Invalid input(s): FREET3 ------------------------------------------------------------------------------------------------------------------ No results for input(s): VITAMINB12, FOLATE, FERRITIN, TIBC, IRON, RETICCTPCT in the last 72 hours.  Coagulation profile Recent Labs  Lab 05/25/20 1222  INR 0.9    No results for input(s): DDIMER in the last 72 hours.  Cardiac Enzymes No results for input(s): CKMB, TROPONINI, MYOGLOBIN in the last 168 hours.  Invalid input(s): CK ------------------------------------------------------------------------------------------------------------------ Invalid input(s): POCBNP   Signature  Lala Lund M.D on 05/31/2020 at 1:44 PM   -  To page go to [email or web address]

## 2020-05-31 NOTE — Plan of Care (Signed)
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
## 2020-06-01 ENCOUNTER — Telehealth: Payer: Self-pay | Admitting: Adult Health

## 2020-06-01 NOTE — Telephone Encounter (Signed)
Left message for patient to call back.   Order for nebulizer was placed by hospital staff on 05/30/20.

## 2020-06-03 NOTE — Telephone Encounter (Signed)
Called patient, she did not answer. Left message for her to call back.   She was scheduled for a HFU with TP in April but this appt has been cancelled.

## 2020-06-13 ENCOUNTER — Inpatient Hospital Stay: Payer: Medicare Other | Admitting: Adult Health

## 2020-06-16 ENCOUNTER — Telehealth: Payer: Self-pay

## 2020-06-16 NOTE — Telephone Encounter (Signed)
Spoke with patient and spouse [name] Fitzpatrick and scheduled an in-person Palliative Consult for 07/13/20 @ 12:30PM  COVID screening was negative. Support animal I the home. Patient lives with husband.  Consent obtained; updated Outlook/Netsmart/Team List and Epic.  Family is aware they may be receiving a call from NP the day before or day of to confirm appointment.

## 2020-06-27 IMAGING — DX DG HAND COMPLETE 3+V*L*
1 series · 3 of 3 positions shown · non-contrast
Comparison: None.

CLINICAL DATA: Decreased range of motion of finger of left hand.

EXAM:
LEFT HAND - COMPLETE 3+ VIEW

[Series 1: hand · 0.14mm/px · 3 of 3 slices shown]
[im 1/3]
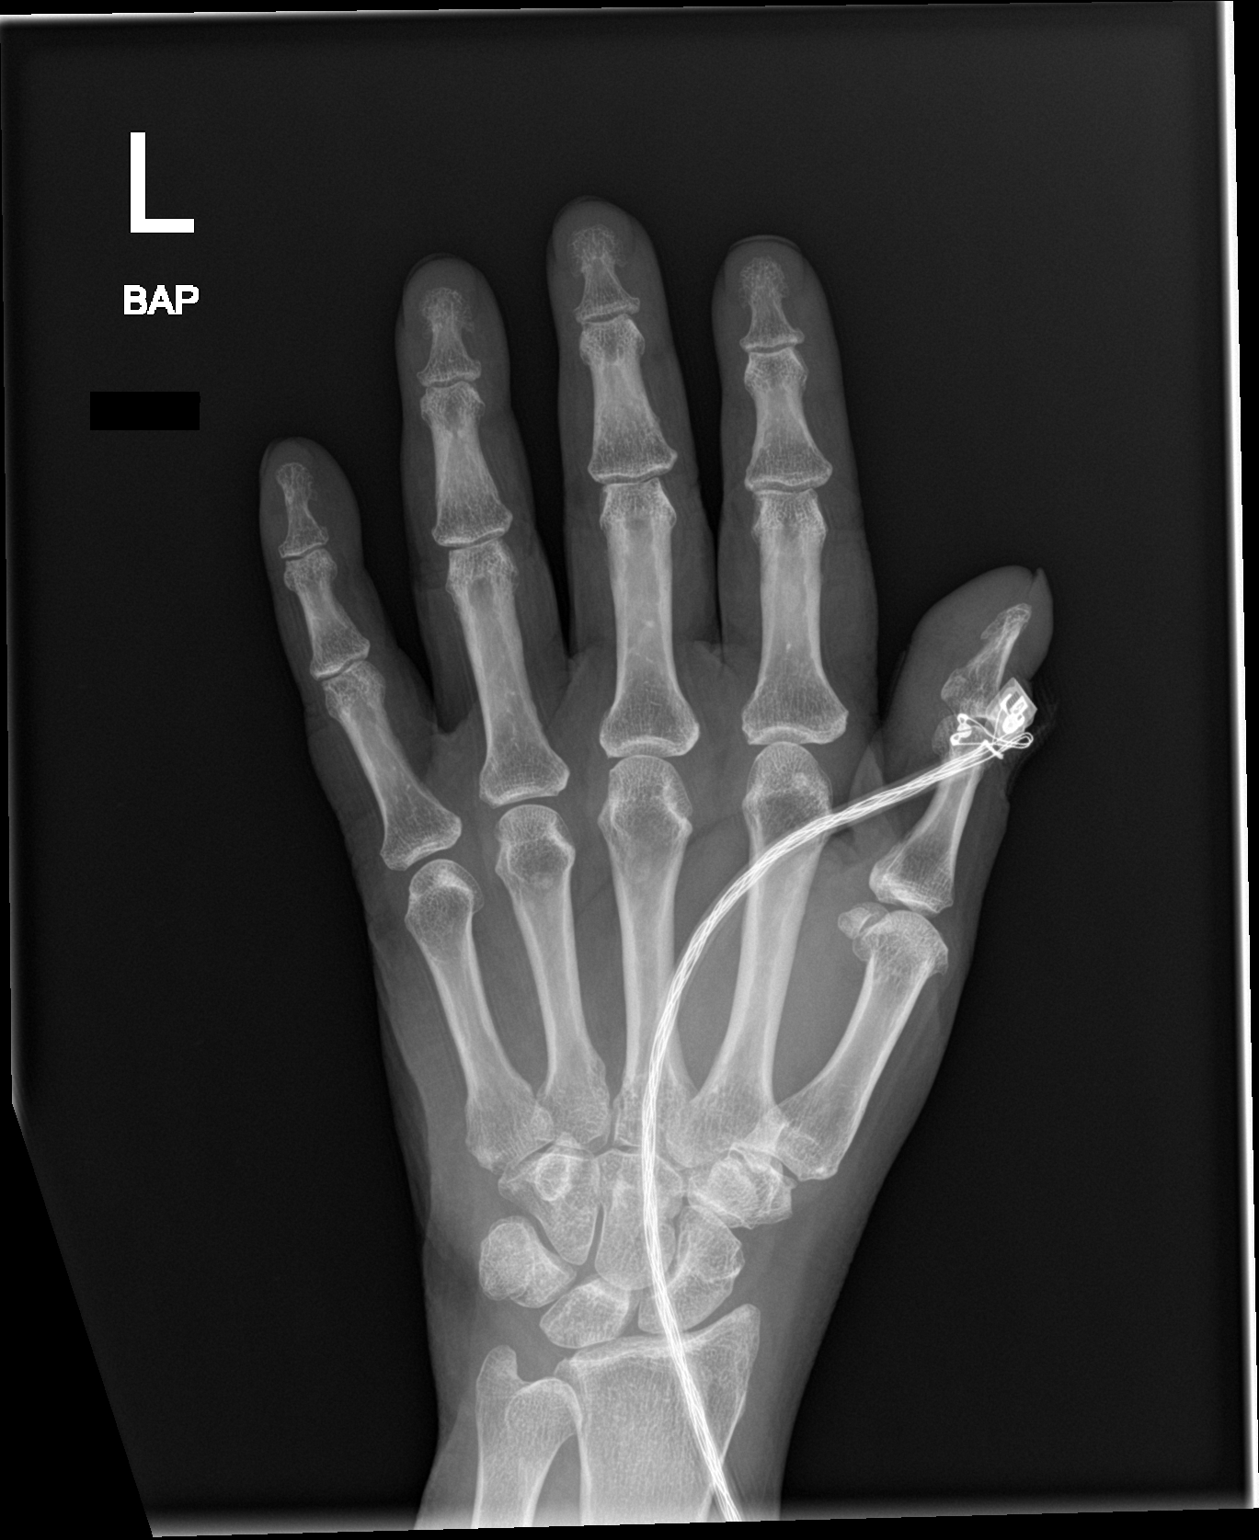
[im 2/3]
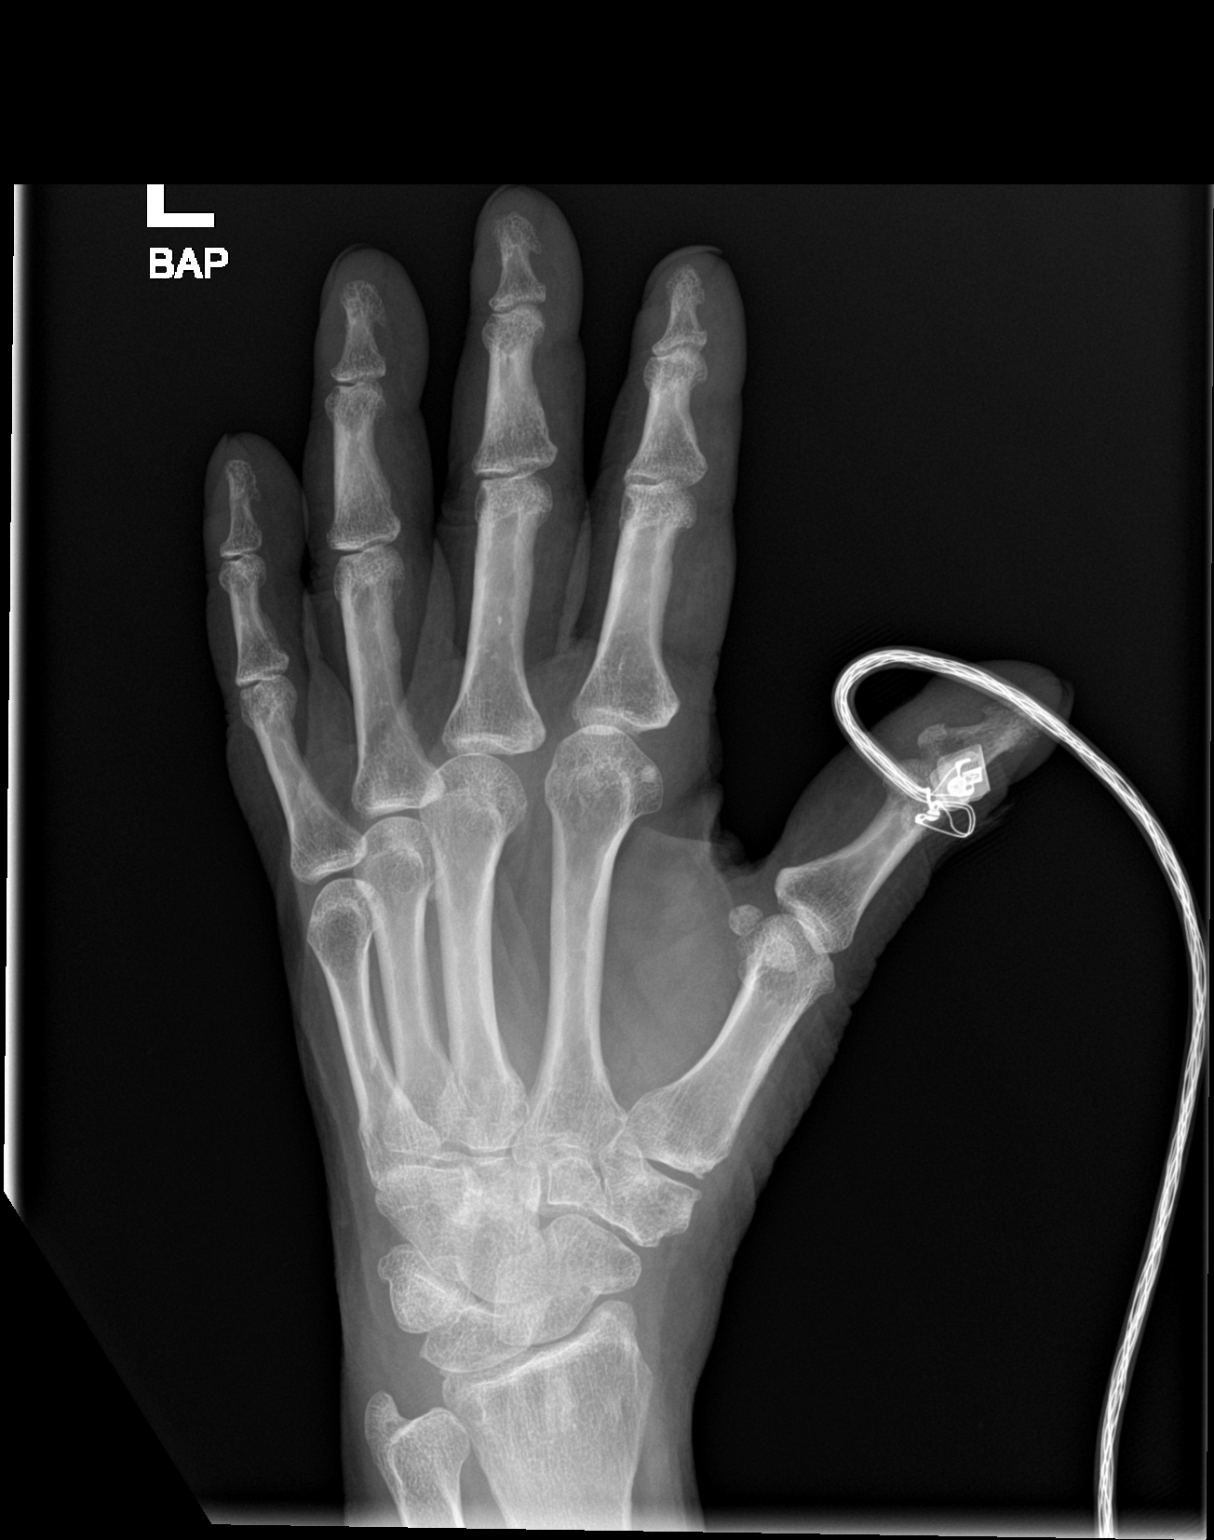
[im 3/3]
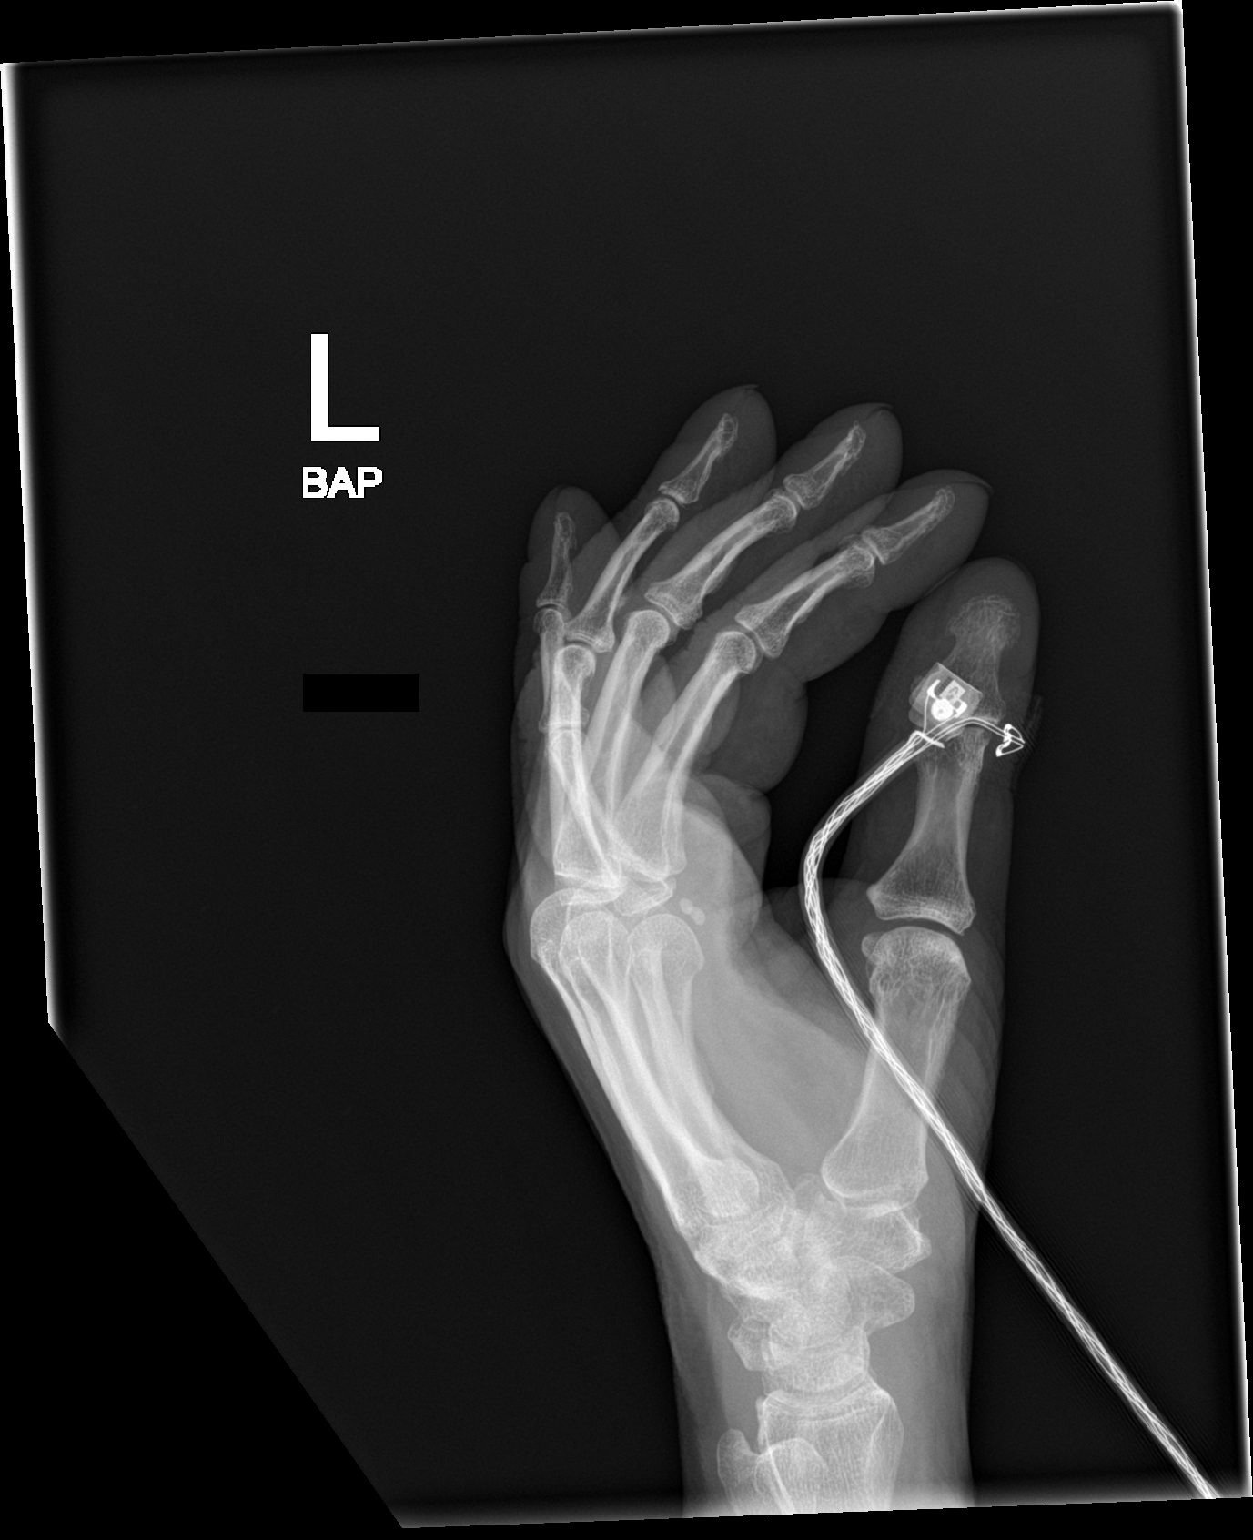

[3 of 3 positions shown; findings below may reference images not displayed]

FINDINGS: There is no evidence of fracture or dislocation. There is no
evidence of arthropathy or other focal bone abnormality. Soft
tissues are unremarkable.
IMPRESSION: Negative.

## 2020-07-13 ENCOUNTER — Other Ambulatory Visit: Payer: Medicare Other | Admitting: Nurse Practitioner

## 2020-07-13 ENCOUNTER — Other Ambulatory Visit: Payer: Self-pay

## 2020-07-13 DIAGNOSIS — Z515 Encounter for palliative care: Secondary | ICD-10-CM

## 2020-07-13 DIAGNOSIS — R52 Pain, unspecified: Secondary | ICD-10-CM

## 2020-07-13 NOTE — Progress Notes (Signed)
[facility] Palliative Care Consult Note Telephone: [phone]  Fax: [phone]    Date of encounter: 07/13/20 PATIENT NAME: [name] Fitzpatrick [id number]   [phone] (home)  DOB: [birth year] MRN: [id number]   PRIMARY CARE PROVIDER:    Lilian Coma MD [place], [place], [place] [address] [phone]  REFERRING PROVIDER:   [place], [place], [place] [address] [phone]  RESPONSIBLE PARTY:    Contact Information    Name Relation Home Work Honaker Spouse [phone]  [phone]     I met face to face with patient in home. Palliative Care was asked to follow this patient by consultation request of  Jobe, Alexis Goodell., MD to address advance care planning and complex medical decision making. This is the initial visit.                                   ASSESSMENT AND PLAN / RECOMMENDATIONS:   Advance Care Planning/Goals of Care: Goals include to maximize quality of life and symptom management. Our advance care planning conversation included a discussion about:     The value and importance of advance care planning   Experiences with loved ones who have been seriously ill or have died   Exploration of personal, cultural or spiritual beliefs that might influence medical decisions   Exploration of goals of care in the event of a sudden injury or illness   Review and updating or creation of an  advance directive document .  CODE STATUS: DNR Goal of care: Patient's goal of care is function. Patient desire to regain her strength and improve her breathing so she can enjoy her grand children. Directives: Signed DNR and MOST forms present in home, copy on [facility] EMR. Details of MOST form include comfort measures, antibiotics if indicated, IV fluids if indicated, no feeding tube. Provided general support and counseling. Palliative care will continue to provided support to  patient, family and medical team.  I spent 25 minutes providing this consultation. More than 50% of the time in this consultation was spent in counseling and care coordination.  ------------------------------------------------------------------------- Symptom Management/Plan: Generalized pain: generalized pain aggravated by fall a week ago, pain worse on right upper back, site ecchymotic and tender to touch. Patient with chronic pain syndrome followed by pain management. Plan: Patient advised to go to Hospital to have her chest x-rayed to rule out fracture as the pain is affecting her breathing. Patient advised to continue current pain medication regimen. Patient currently takes MS Contin 8m twice a day, Hydromorphone 473mevery 4 hours as needed, Tizanidine 92m392mhree times daily and Gabapentin 100m52mree times a day. Continue follow up with pain management clinic as scheduled.  Chronic respiratory failure with hypoxia: condition related to Pulmonary fibrosis is ongoing and worsening since her fall a week ago. Patient report difficulty taking deep breath due to pain.  Plan: patient advised to go to hospital for imaging to rule out fracture as cause of acute pain and dyspnea. Questions and concerns were addressed. Patient was encouraged to call with questions and/or concerns. My business card was provided. Visit update discussed with patient's husband, advised to take patient to ED for evaluation of her pain.  Follow up Palliative Care Visit: Palliative care will continue to follow for complex medical decision making, advance care planning,  and clarification of goals. Return in 2 weeks or prn.  PPS: 50% weak  HOSPICE ELIGIBILITY/DIAGNOSIS: TBD  CHIEF COMPLAIN: generalized pain  History obtained from review of Epic EMR and discussion with patient.   HISTORY OF PRESENT ILLNESS:  ZARIELLE CEA is a 65 y.o. year old female  with complain of generalized pain in the context of chronic pain  syndrome, aggravated by recent fall a week ago. Pain is severe and worse in her right upper back, rates pain as 7/10, not relieved with current pain medication regimen and is affecting her physical function and breathing. Patient's other medical condition include Idiopathic pulmonary fibrosis oxygen dependent.  I reviewed available labs, medications, imaging, studies and related documents from the EMR.  Records reviewed and summarized above.   ROS General: denies fever or chills EYES: denies acute vision changes ENMT: denies dysphagia Cardiovascular: denies chest pain, endorsed DOE Pulmonary: denies cough, denies increased SOB Abdomen: endorses poor appetite, denies constipation, endorses continence of bowel GU: denies dysuria, endorses continence of urine MSK:  Endorsed weakness, report falls  Skin: denies rashes or wounds Neurological: endorsed pain, denies insomnia Psych: Endorses occasional sad mood Heme/lymph/immuno: denies bruises, abnormal bleeding  Physical Exam: Vital signs: BP 120/68, P 78, RR 20, 88% on 5L General: frail appearing, cooperative EYES: anicteric sclera, no discharge  ENMT: intact hearing, oral mucous membranes moist CV: S1S2 normal, no LE edema Pulmonary:  increased work of breathing with slight activity, no cough, on supplemental oxygen Abdomen: no ascites GU: deferred MSK: mild sarcopenia, moves all extremities, ambulatory Skin: warm and dry, no rashes or wounds on visible skin, ecchymotic area noted to right upper back, finger tips cyanotic Neuro:  generalized weakness,  no cognitive impairment Psych: non-anxious affect, A and O x 3 Hem/lymph/immuno: no widespread bruising  CURRENT PROBLEM LIST:  Patient Active Problem List   Diagnosis Date Noted  . Acute on chronic respiratory failure ([place]) 05/25/2020  . Acute on chronic respiratory failure with hypoxia ([place])   . Seizure disorder ([place])   . Acute deep vein thrombosis (DVT) of left femoral vein ([place])    . Interstitial pneumonia ([place])   . ARDS (adult respiratory distress syndrome) ([place])   . Chest pain 09/09/2015  . Cellulitis of leg, left 09/09/2015  . Glossitis 09/09/2015  . Anxiety state 09/09/2015  . Headache, acute 09/08/2015  . Bilateral hand pain 06/07/2015  . Nausea without vomiting 06/06/2015  . 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  . Knee pain, left 04/03/2015  . Shoulder pain, right 04/03/2015  . Urinary incontinence 04/03/2015  . Multiple lipomas 11/08/2013  . Compression fracture of L1 lumbar vertebra ([place]) 11/08/2013  . Dyspnea 08/14/2012  . Obesity (BMI 30-39.9) 03/23/2012  . IGT (impaired glucose tolerance) 03/23/2012  . Vaginal dryness, menopausal 08/28/2011  . 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 Post traumatic stress disorder (PTSD) 07/02/2011    Class: Chronic  . COPD (chronic obstructive pulmonary disease) ([place]) 10/21/2010  . Pulmonary nodule 10/21/2010  . Headache 10/20/2010  . MVP (mitral valve prolapse) 08/23/2010  . IBS 10/12/2009  . Post-operative hypothyroidism 05/17/2006  . ALLERGIC RHINITIS 05/17/2006  . HOT FLASHES 05/17/2006  . Osteoarthritis 05/17/2006   PAST MEDICAL HISTORY:  Active Ambulatory Problems    Diagnosis Date Noted  . Post-operative hypothyroidism 05/17/2006  . ALLERGIC RHINITIS 05/17/2006  . IBS 10/12/2009  . HOT FLASHES 05/17/2006  . Osteoarthritis 05/17/2006  . MVP (mitral valve prolapse) 08/23/2010  . Headache 10/20/2010  . COPD (chronic obstructive pulmonary disease) ([place])  10/21/2010  . Pulmonary nodule 10/21/2010  . Post traumatic stress disorder (PTSD) 07/02/2011  . 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 Vaginal dryness, menopausal 08/28/2011  . Obesity (BMI 30-39.9) 03/23/2012  . IGT (impaired glucose tolerance) 03/23/2012  . Dyspnea 08/14/2012  . Multiple lipomas 11/08/2013  . Compression fracture of L1 lumbar vertebra ([place]) 11/08/2013  . Knee pain, left 04/03/2015  . Shoulder pain,  right 04/03/2015  . Urinary incontinence 04/03/2015  . 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  . Nausea without vomiting 06/06/2015  . Bilateral hand pain 06/07/2015  . Headache, acute 09/08/2015  . Chest pain 09/09/2015  . Cellulitis of leg, left 09/09/2015  . Glossitis 09/09/2015  . Anxiety state 09/09/2015  . Acute on chronic respiratory failure with hypoxia ([place])   . Seizure disorder ([place])   . Acute deep vein thrombosis (DVT) of left femoral vein ([place])   . Interstitial pneumonia ([place])   . ARDS (adult respiratory distress syndrome) ([place])   . Acute on chronic respiratory failure ([place]) 05/25/2020   Resolved Ambulatory Problems    Diagnosis Date Noted  . IDDM 02/05/2010  . HYPERLIPIDEMIA 02/05/2010  . ANXIETY 05/17/2006  . Adjustment reaction with anxiety and depression 05/17/2006  . COPD 02/05/2010  . NECK PAIN, CHRONIC 05/17/2006  . BACK PAIN, LUMBAR 06/21/2006  . Acute bronchitis 03/02/2010  . Nausea without vomiting 03/02/2010  . Worsening headaches 08/23/2010  . Bruit 08/23/2010  . Abdominal mass, left upper quadrant 08/23/2010  . Pulmonary nodule 09/17/2010  . Chest pain 10/20/2010  . Abdominal pain 12/07/2010  . Suicidal ideation 04/23/2011  . Major depressive disorder, recurrent episode, severe ([place]) 07/02/2011  . Otitis externa 08/28/2011  . Bronchitis 12/10/2011  . Nicotine addiction 12/23/2011  . Edema 03/20/2012  . Nausea 03/20/2012  . Unspecified chronic bronchitis ([place]) 08/14/2012  . Abdominal mass 08/14/2012  . Vertigo 05/27/2013  . Chest pain 05/27/2013  . Urinary frequency 11/04/2013  . Back pain, acute 11/08/2013  . Fall at home 11/08/2013  . Respiratory distress 11/10/2013  . Neck pain 11/11/2014  . Low back pain 11/11/2014  . Lymphadenitis, acute 11/11/2014  . Depression 11/13/2014  . Incontinence of urine 11/13/2014  . Constipation 11/13/2014  . Medicare annual wellness visit, subsequent 06/06/2015   Past Medical History:  Diagnosis  Date  . Anxiety   . Chronic LBP   . Chronic neck pain   . DM type 2 (diabetes mellitus, type 2) ([place])   . Fibromyalgia   . GERD (gastroesophageal reflux disease)   . H/O pulmonary fibrosis   . H/O suicide attempt   . Hashimoto's thyroiditis   . Hypertension   . Hypothyroidism   . IBS (irritable bowel syndrome)   . Migraine headache   . Thyroiditis, 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   SOCIAL HX:  Social History   Tobacco Use  . Smoking status: Former Smoker    Packs/day: 0.30    Years: 40.00    Pack years: 12.00    Types: Cigarettes    Quit date: 07/24/2012    Years since quitting: 7.9  . Smokeless tobacco: Never Used  Substance Use Topics  . Alcohol use: No   FAMILY HX:  Family History  Problem Relation Age of Onset  . Cancer Father        pancreatic   . Pancreatic cancer Father   . Heart failure Mother   . GI problems Mother   . Heart disease Mother   . Diabetes Mother   . Thyroid disease Sister   .  Cancer Maternal Grandfather   . Cancer Other        family history       ALLERGIES:  Allergies  Allergen Reactions  . Levofloxacin Shortness Of Breath and Itching    WHELPS WHELPS WHELPS WHELPS   . Penicillins Other (See Comments)    Stroke-like symptoms Has patient had a PCN reaction causing immediate rash, facial/tongue/throat swelling, SOB or lightheadedness with hypotension: YES Has patient had a PCN reaction causing severe rash involving mucus membranes or skin necrosis: No Has patient had a PCN reaction that required hospitalization: No Has patient had a PCN reaction occurring within the last 10 years: Yes If all of the above answers are "NO", then may proceed with Cephalosporin use.   . Prednisone Shortness Of Breath and Nausea And Vomiting  . Cephalexin Hives, Swelling and Rash  . Latex Rash and Other (See Comments)    Causes skin to tear easily  . Metoclopramide Hcl Hives and Rash     PERTINENT MEDICATIONS:  Outpatient Encounter Medications as of 07/13/2020   Medication Sig  . alendronate (FOSAMAX) 70 MG tablet Take 70 mg by mouth once a week.  . ALPRAZolam (XANAX) 0.5 MG tablet Take 0.5 mg by mouth 2 (two) times daily as needed.  Marland Kitchen aspirin EC 81 MG tablet Take 81 mg by mouth daily.  . benzonatate (TESSALON) 200 MG capsule Take 1 capsule by mouth 3 (three) times daily. For 7 days  . clonazePAM (KLONOPIN) 1 MG tablet Take 1 mg by mouth 2 (two) times daily as needed.  . dicyclomine (BENTYL) 10 MG capsule TAKE 1 CAPSULE BY MOUTH TWICE DAILY.  . fluticasone (FLONASE) 50 MCG/ACT nasal spray PLACE 2 SPRAYS INTO BOTH NOSTRILS DAILY  . gabapentin (NEURONTIN) 100 MG capsule Take 100 mg by mouth 3 (three) times daily.  Marland Kitchen HYDROmorphone (DILAUDID) 4 MG tablet Take 4 mg by mouth every 4 (four) hours as needed for moderate pain.  Marland Kitchen ipratropium-albuterol (DUONEB) 0.5-2.5 (3) MG/3ML SOLN Take 3 mLs by nebulization every 6 (six) hours as needed.  Marland Kitchen levothyroxine (SYNTHROID) 175 MCG tablet Take 175 mcg by mouth daily.  . pantoprazole (PROTONIX) 40 MG tablet Take 40 mg by mouth 2 (two) times daily.  . predniSONE (DELTASONE) 50 MG tablet Start prednisone 50 mg oral daily for 3 days, then taper by 10 mg every 3 days till tapered off.  . promethazine (PHENERGAN) 25 MG tablet Take 1 tablet (25 mg total) by mouth daily as needed for nausea or vomiting.  . sertraline (ZOLOFT) 100 MG tablet Take 100 mg by mouth daily.  . simvastatin (ZOCOR) 20 MG tablet Take 20 mg by mouth at bedtime.  Marland Kitchen tiZANidine (ZANAFLEX) 4 MG tablet Take 8 mg by mouth 3 (three) times daily.  . traZODone (DESYREL) 100 MG tablet Take 100-200 mg by mouth at bedtime as needed for sleep.   No facility-administered encounter medications on file as of 07/13/2020.    Thank you for the opportunity to participate in the care of [name] Fitzpatrick.  The palliative care team will continue to follow. Please call our office at [phone] if we can be of additional assistance.   Jari Favre, DNP,  AGPCNP-BC  COVID-19 PATIENT SCREENING TOOL Asked and negative response unless otherwise noted:   Have you had symptoms of covid, tested positive or been in contact with someone with symptoms/positive test in the past 5-10 days?

## 2020-07-15 ENCOUNTER — Inpatient Hospital Stay (HOSPITAL_COMMUNITY)
Admission: EM | Admit: 2020-07-15 | Discharge: 2020-07-19 | DRG: 193 | Disposition: A | Discharge status: Hospice | Payer: Medicare Other | Attending: Family Medicine | Admitting: Family Medicine

## 2020-07-15 ENCOUNTER — Encounter (HOSPITAL_COMMUNITY): Payer: Self-pay

## 2020-07-15 ENCOUNTER — Emergency Department (HOSPITAL_COMMUNITY): Payer: Medicare Other

## 2020-07-15 ENCOUNTER — Other Ambulatory Visit: Payer: Self-pay

## 2020-07-15 DIAGNOSIS — Z7982 Long term (current) use of aspirin: Secondary | ICD-10-CM

## 2020-07-15 DIAGNOSIS — M797 Fibromyalgia: Secondary | ICD-10-CM | POA: Diagnosis present

## 2020-07-15 DIAGNOSIS — Z7952 Long term (current) use of systemic steroids: Secondary | ICD-10-CM

## 2020-07-15 DIAGNOSIS — Z888 Allergy status to other drugs, medicaments and biological substances status: Secondary | ICD-10-CM

## 2020-07-15 DIAGNOSIS — Z66 Do not resuscitate: Secondary | ICD-10-CM | POA: Diagnosis present

## 2020-07-15 DIAGNOSIS — R000 Tachycardia, unspecified: Secondary | ICD-10-CM | POA: Diagnosis present

## 2020-07-15 DIAGNOSIS — J189 Pneumonia, unspecified organism: Secondary | ICD-10-CM | POA: Diagnosis not present

## 2020-07-15 DIAGNOSIS — Z20822 Contact with and (suspected) exposure to covid-19: Secondary | ICD-10-CM | POA: Diagnosis present

## 2020-07-15 DIAGNOSIS — Z880 Allergy status to penicillin: Secondary | ICD-10-CM

## 2020-07-15 DIAGNOSIS — W19XXXA Unspecified fall, initial encounter: Secondary | ICD-10-CM

## 2020-07-15 DIAGNOSIS — E785 Hyperlipidemia, unspecified: Secondary | ICD-10-CM | POA: Diagnosis present

## 2020-07-15 DIAGNOSIS — J449 Chronic obstructive pulmonary disease, unspecified: Secondary | ICD-10-CM | POA: Diagnosis present

## 2020-07-15 DIAGNOSIS — Z8349 Family history of other endocrine, nutritional and metabolic diseases: Secondary | ICD-10-CM

## 2020-07-15 DIAGNOSIS — Z833 Family history of diabetes mellitus: Secondary | ICD-10-CM

## 2020-07-15 DIAGNOSIS — Z79890 Hormone replacement therapy (postmenopausal): Secondary | ICD-10-CM

## 2020-07-15 DIAGNOSIS — Y92009 Unspecified place in unspecified non-institutional (private) residence as the place of occurrence of the external cause: Secondary | ICD-10-CM

## 2020-07-15 DIAGNOSIS — J8410 Pulmonary fibrosis, unspecified: Secondary | ICD-10-CM | POA: Diagnosis present

## 2020-07-15 DIAGNOSIS — Z91040 Latex allergy status: Secondary | ICD-10-CM

## 2020-07-15 DIAGNOSIS — K219 Gastro-esophageal reflux disease without esophagitis: Secondary | ICD-10-CM | POA: Diagnosis present

## 2020-07-15 DIAGNOSIS — Z87891 Personal history of nicotine dependence: Secondary | ICD-10-CM

## 2020-07-15 DIAGNOSIS — J440 Chronic obstructive pulmonary disease with acute lower respiratory infection: Secondary | ICD-10-CM | POA: Diagnosis present

## 2020-07-15 DIAGNOSIS — F419 Anxiety disorder, unspecified: Secondary | ICD-10-CM | POA: Diagnosis present

## 2020-07-15 DIAGNOSIS — J9621 Acute and chronic respiratory failure with hypoxia: Secondary | ICD-10-CM | POA: Diagnosis present

## 2020-07-15 DIAGNOSIS — E119 Type 2 diabetes mellitus without complications: Secondary | ICD-10-CM | POA: Diagnosis present

## 2020-07-15 DIAGNOSIS — F32A Depression, unspecified: Secondary | ICD-10-CM | POA: Diagnosis present

## 2020-07-15 DIAGNOSIS — J9620 Acute and chronic respiratory failure, unspecified whether with hypoxia or hypercapnia: Secondary | ICD-10-CM

## 2020-07-15 DIAGNOSIS — R54 Age-related physical debility: Secondary | ICD-10-CM | POA: Diagnosis present

## 2020-07-15 DIAGNOSIS — Z8249 Family history of ischemic heart disease and other diseases of the circulatory system: Secondary | ICD-10-CM

## 2020-07-15 DIAGNOSIS — I10 Essential (primary) hypertension: Secondary | ICD-10-CM | POA: Diagnosis present

## 2020-07-15 DIAGNOSIS — Z981 Arthrodesis status: Secondary | ICD-10-CM

## 2020-07-15 DIAGNOSIS — G40909 Epilepsy, unspecified, not intractable, without status epilepticus: Secondary | ICD-10-CM

## 2020-07-15 DIAGNOSIS — Z86718 Personal history of other venous thrombosis and embolism: Secondary | ICD-10-CM

## 2020-07-15 DIAGNOSIS — Z79899 Other long term (current) drug therapy: Secondary | ICD-10-CM

## 2020-07-15 DIAGNOSIS — G894 Chronic pain syndrome: Secondary | ICD-10-CM | POA: Diagnosis present

## 2020-07-15 LAB — BLOOD GAS, ARTERIAL
Acid-Base Excess: 1.6 mmol/L (ref 0.0–2.0)
Bicarbonate: 25.3 mmol/L (ref 20.0–28.0)
FIO2: 100
O2 Saturation: 96.6 %
Patient temperature: 37.4
pCO2 arterial: 54.1 mmHg — ABNORMAL HIGH (ref 32.0–48.0)
pH, Arterial: 7.321 — ABNORMAL LOW (ref 7.350–7.450)
pO2, Arterial: 106 mmHg (ref 83.0–108.0)

## 2020-07-15 LAB — COMPREHENSIVE METABOLIC PANEL
ALT: 10 U/L (ref 0–44)
AST: 15 U/L (ref 15–41)
Albumin: 3.6 g/dL (ref 3.5–5.0)
Alkaline Phosphatase: 73 U/L (ref 38–126)
Anion gap: 10 (ref 5–15)
BUN: 12 mg/dL (ref 8–23)
CO2: 26 mmol/L (ref 22–32)
Calcium: 9.1 mg/dL (ref 8.9–10.3)
Chloride: 98 mmol/L (ref 98–111)
Creatinine, Ser: 0.65 mg/dL (ref 0.44–1.00)
GFR, Estimated: >60 mL/min (ref >60)
Glucose, Bld: 123 mg/dL — ABNORMAL HIGH (ref 70–99)
Potassium: 3.7 mmol/L (ref 3.5–5.1)
Sodium: 134 mmol/L — ABNORMAL LOW (ref 135–145)
Total Bilirubin: 0.6 mg/dL (ref 0.3–1.2)
Total Protein: 7.8 g/dL (ref 6.5–8.1)

## 2020-07-15 LAB — LACTIC ACID, PLASMA: Lactic Acid, Venous: 0.7 mmol/L (ref 0.5–1.9)

## 2020-07-15 LAB — PROTIME-INR
INR: 1 (ref 0.8–1.2)
Prothrombin Time: 13.6 seconds (ref 11.4–15.2)

## 2020-07-15 LAB — CBC WITH DIFFERENTIAL/PLATELET
Abs Immature Granulocytes: 0.05 10*3/uL (ref 0.00–0.07)
Basophils Absolute: 0.2 10*3/uL — ABNORMAL HIGH (ref 0.0–0.1)
Basophils Relative: 1 %
Eosinophils Absolute: 0.5 10*3/uL (ref 0.0–0.5)
Eosinophils Relative: 3 %
HCT: 37.9 % (ref 36.0–46.0)
Hemoglobin: 11.5 g/dL — ABNORMAL LOW (ref 12.0–15.0)
Immature Granulocytes: 0 %
Lymphocytes Relative: 7 %
Lymphs Abs: 1.2 10*3/uL (ref 0.7–4.0)
MCH: 27.7 pg (ref 26.0–34.0)
MCHC: 30.3 g/dL (ref 30.0–36.0)
MCV: 91.3 fL (ref 80.0–100.0)
Monocytes Absolute: 0.7 10*3/uL (ref 0.1–1.0)
Monocytes Relative: 4 %
Neutro Abs: 14.6 10*3/uL — ABNORMAL HIGH (ref 1.7–7.7)
Neutrophils Relative %: 85 %
Platelets: 441 10*3/uL — ABNORMAL HIGH (ref 150–400)
RBC: 4.15 MIL/uL (ref 3.87–5.11)
RDW: 14.5 % (ref 11.5–15.5)
WBC: 17.1 10*3/uL — ABNORMAL HIGH (ref 4.0–10.5)
nRBC: 0 % (ref 0.0–0.2)

## 2020-07-15 LAB — TROPONIN I (HIGH SENSITIVITY): Troponin I (High Sensitivity): 7 ng/L (ref <18)

## 2020-07-15 LAB — BRAIN NATRIURETIC PEPTIDE: B Natriuretic Peptide: 337 pg/mL — ABNORMAL HIGH (ref 0.0–100.0)

## 2020-07-15 LAB — APTT: aPTT: 31 seconds (ref 24–36)

## 2020-07-15 MED ORDER — SODIUM CHLORIDE 0.9 % IV BOLUS
1000.0000 mL | Freq: Once | INTRAVENOUS | Status: AC
  Administered 2020-07-15: 1000 mL via INTRAVENOUS

## 2020-07-15 MED ORDER — VANCOMYCIN HCL IN DEXTROSE 1-5 GM/200ML-% IV SOLN
1000.0000 mg | Freq: Once | INTRAVENOUS | Status: AC
  Administered 2020-07-15: 1000 mg via INTRAVENOUS
  Filled 2020-07-15: qty 200

## 2020-07-15 MED ORDER — ALBUTEROL SULFATE (2.5 MG/3ML) 0.083% IN NEBU
2.5000 mg | INHALATION_SOLUTION | Freq: Once | RESPIRATORY_TRACT | Status: AC
  Administered 2020-07-15: 2.5 mg via RESPIRATORY_TRACT
  Filled 2020-07-15: qty 3

## 2020-07-15 MED ORDER — METHYLPREDNISOLONE SODIUM SUCC 125 MG IJ SOLR
125.0000 mg | Freq: Once | INTRAMUSCULAR | Status: AC
  Administered 2020-07-15: 125 mg via INTRAVENOUS
  Filled 2020-07-15: qty 2

## 2020-07-15 MED ORDER — IPRATROPIUM-ALBUTEROL 0.5-2.5 (3) MG/3ML IN SOLN
3.0000 mL | Freq: Once | RESPIRATORY_TRACT | Status: AC
  Administered 2020-07-15: 3 mL via RESPIRATORY_TRACT
  Filled 2020-07-15: qty 3

## 2020-07-15 MED ORDER — SODIUM CHLORIDE 0.9 % IV SOLN
1.0000 g | Freq: Once | INTRAVENOUS | Status: AC
  Administered 2020-07-15: 1 g via INTRAVENOUS
  Filled 2020-07-15: qty 1

## 2020-07-15 MED ORDER — IOHEXOL 350 MG/ML SOLN
100.0000 mL | Freq: Once | INTRAVENOUS | Status: AC | PRN
  Administered 2020-07-15: 100 mL via INTRAVENOUS

## 2020-07-15 MED ORDER — MORPHINE SULFATE (PF) 2 MG/ML IV SOLN
2.0000 mg | Freq: Once | INTRAVENOUS | Status: AC
  Administered 2020-07-15: 2 mg via INTRAVENOUS
  Filled 2020-07-15: qty 1

## 2020-07-15 MED ORDER — CYCLOBENZAPRINE HCL 10 MG PO TABS
10.0000 mg | ORAL_TABLET | Freq: Once | ORAL | Status: AC
  Administered 2020-07-16: 10 mg via ORAL
  Filled 2020-07-15: qty 1

## 2020-07-15 MED ORDER — SODIUM CHLORIDE 0.9 % IV SOLN
1.0000 g | Freq: Three times a day (TID) | INTRAVENOUS | Status: DC
  Administered 2020-07-16 – 2020-07-17 (×4): 1 g via INTRAVENOUS
  Filled 2020-07-15 (×4): qty 1

## 2020-07-15 MED ORDER — ONDANSETRON HCL 4 MG/2ML IJ SOLN
4.0000 mg | Freq: Once | INTRAMUSCULAR | Status: AC
  Administered 2020-07-15: 4 mg via INTRAVENOUS
  Filled 2020-07-15: qty 2

## 2020-07-15 MED ORDER — MORPHINE SULFATE (PF) 2 MG/ML IV SOLN
2.0000 mg | Freq: Once | INTRAVENOUS | Status: AC
  Administered 2020-07-16: 2 mg via INTRAVENOUS
  Filled 2020-07-15: qty 1

## 2020-07-15 NOTE — ED Notes (Signed)
Pt unable to sign MSE at this time. EDP at bedside during Triage.

## 2020-07-15 NOTE — ED Triage Notes (Signed)
Pt arrived via POV c/o new onset Chest Pain. Pt on 6L Nasal Cannula at home, and reports falling recently and injuring back right side. Pt SOB during Triage reporting heaviness and tightness in chest.

## 2020-07-15 NOTE — ED Provider Notes (Signed)
[facility] EMERGENCY DEPARTMENT Provider Note   CSN: [id number] Arrival date & time: 07/15/20  2018     History Chief Complaint  Patient presents with  . Chest Pain    [name] Fitzpatrick is a 65 y.o. female.  Patient complains of right-sided chest pain and shortness of breath she has a history of COPD and severe pulmonary fibrosis.  The history is provided by the patient and medical records. No language interpreter was used.  Chest Pain Pain location:  R chest Pain quality: aching   Pain radiates to:  Does not radiate Pain severity:  Moderate Onset quality:  Sudden Progression:  Worsening Chronicity:  New Context: not breathing   Associated symptoms: no abdominal pain, no back pain, no cough, no fatigue and no headache        Past Medical History:  Diagnosis Date  . Acute deep vein thrombosis (DVT) of left femoral vein ([place])   . Acute on chronic respiratory failure with hypoxia ([place])   . Anxiety   . ARDS (adult respiratory distress syndrome) ([place])   . Chronic LBP   . Chronic neck pain   . COPD (chronic obstructive pulmonary disease) ([place])   . Depression   . DM type 2 (diabetes mellitus, type 2) ([place])   . Dyspnea 05/25/2020  . Fibromyalgia   . GERD (gastroesophageal reflux disease)   . H/O pulmonary fibrosis   . H/O suicide attempt   . Hashimoto's thyroiditis   . Hypertension   . Hypothyroidism   . IBS (irritable bowel syndrome)    with constipation  . Interstitial pneumonia ([place])   . Migraine headache   . Osteoarthritis   . Seizure disorder ([place])   . Thyroiditis, 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   had total thyroidectomy 11/30/2013 at [facility]    Patient Active Problem List   Diagnosis Date Noted  . Acute on chronic respiratory failure ([place]) 05/25/2020  . Acute on chronic respiratory failure with hypoxia ([place])   . Seizure disorder ([place])   . Acute deep vein thrombosis (DVT) of left femoral vein ([place])   . Interstitial pneumonia ([place])   . ARDS (adult respiratory  distress syndrome) ([place])   . Chest pain 09/09/2015  . Cellulitis of leg, left 09/09/2015  . Glossitis 09/09/2015  . Anxiety state 09/09/2015  . Headache, acute 09/08/2015  . Bilateral hand pain 06/07/2015  . Nausea without vomiting 06/06/2015  . 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  . Knee pain, left 04/03/2015  . Shoulder pain, right 04/03/2015  . Urinary incontinence 04/03/2015  . Multiple lipomas 11/08/2013  . Compression fracture of L1 lumbar vertebra ([place]) 11/08/2013  . Dyspnea 08/14/2012  . Obesity (BMI 30-39.9) 03/23/2012  . IGT (impaired glucose tolerance) 03/23/2012  . Vaginal dryness, menopausal 08/28/2011  . 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 Post traumatic stress disorder (PTSD) 07/02/2011    Class: Chronic  . COPD (chronic obstructive pulmonary disease) ([place]) 10/21/2010  . Pulmonary nodule 10/21/2010  . Headache 10/20/2010  . MVP (mitral valve prolapse) 08/23/2010  . IBS 10/12/2009  . Post-operative hypothyroidism 05/17/2006  . ALLERGIC RHINITIS 05/17/2006  . HOT FLASHES 05/17/2006  . Osteoarthritis 05/17/2006    Past Surgical History:  Procedure Laterality Date  . ABDOMINAL HYSTERECTOMY    . b/l foot surgery --bunions    . BIOPSY  07/29/2015   Procedure: BIOPSY;  Surgeon: Rogene Houston, MD;  Location: [facility] ENDO SUITE;  Service: Endoscopy;;  Duodenal,  gastric, and esophageal  biopsies  . C-Spine fusion  1991, 2004  .  CHOLECYSTECTOMY    . COLONOSCOPY WITH PROPOFOL N/A 07/29/2015   Procedure: COLONOSCOPY WITH PROPOFOL;  Surgeon: Rogene Houston, MD;  Location: [facility] ENDO SUITE;  Service: Endoscopy;  Laterality: N/A;  8:30  . ESOPHAGEAL DILATION N/A 07/29/2015   Procedure: ESOPHAGEAL DILATION;  Surgeon: Rogene Houston, MD;  Location: [facility] ENDO SUITE;  Service: Endoscopy;  Laterality: N/A;  . ESOPHAGOGASTRODUODENOSCOPY (EGD) WITH PROPOFOL N/A 07/29/2015   Procedure: ESOPHAGOGASTRODUODENOSCOPY (EGD) WITH PROPOFOL;  Surgeon: Rogene Houston, MD;   Location: [facility] ENDO SU[facility];  Service: Endoscopy;  Laterality: N/A;  . l carpal tunnel release Right   . l spine fusion x 2    . reconstucted right ankle    . Right knee arthroscopic surgery    . total reconstruction of right ankle and heel     total of three   . VESICOVAGINAL FISTULA CLOSURE W/ TAH  1993   Fibroids no Ca     OB History   No obstetric history on file.     Family History  Problem Relation Age of Onset  . Cancer Father        pancreatic   . Pancreatic cancer Father   . Heart failure Mother   . GI problems Mother   . Heart disease Mother   . Diabetes Mother   . Thyroid disease Sister   . Cancer Maternal Grandfather   . Cancer Other        family history     Social History   Tobacco Use  . Smoking status: Former Smoker    Packs/day: 0.30    Years: 40.00    Pack years: 12.00    Types: Cigarettes    Quit date: 07/24/2012    Years since quitting: 7.9  . Smokeless tobacco: Never Used  Vaping Use  . Vaping Use: Never used  Substance Use Topics  . Alcohol use: No  . Drug use: Yes    Comment: 20 yrs ago, OD on prescription drugs    Home Medications Prior to Admission medications   Medication Sig Start Date End Date Taking? Authorizing Provider  acetaminophen (TYLENOL) 500 MG tablet Take 1,000 mg by mouth every 6 (six) hours as needed.   Yes [provider]  alendronate (FOSAMAX) 70 MG tablet Take 70 mg by mouth once a week. 04/18/20  Yes [provider]  ALPRAZolam Duanne Moron) 0.5 MG tablet Take 0.5 mg by mouth 2 (two) times daily as needed. 05/20/20  Yes [provider]  aspirin EC 81 MG tablet Take 81 mg by mouth daily.   Yes [provider]  dicyclomine (BENTYL) 10 MG capsule TAKE 1 CAPSULE BY MOUTH TWICE DAILY. 09/03/14  Yes Fayrene Helper, MD  fluticasone [facility]) 50 MCG/ACT nasal spray PLACE 2 SPRAYS INTO BOTH NOSTRILS DAILY 04/11/16  Yes Fayrene Helper, MD  gabapentin (NEURONTIN) 100 MG capsule Take 100 mg by  mouth 3 (three) times daily. 05/10/20  Yes [provider]  HYDROmorphone (DILAUDID) 4 MG tablet Take 4 mg by mouth every 4 (four) hours as needed for moderate pain. 03/23/15  Yes [provider]  ipratropium-albuterol (DUONEB) 0.5-2.5 (3) MG/3ML SOLN Take 3 mLs by nebulization every 6 (six) hours as needed. 05/31/20  Yes Elgergawy, Silver Huguenin, MD  levothyroxine (SYNTHROID) 175 MCG tablet Take 175 mcg by mouth daily. 03/31/20  Yes [provider]  MS CONTIN 15 MG 12 hr tablet Take 15 mg by mouth 2 (two) times daily. 06/27/20  Yes [provider]  pantoprazole (PROTONIX) 40 MG tablet Take 40 mg by mouth 2 (two) times daily. 05/20/20  Yes [provider]  promethazine (PHENERGAN) 25 MG tablet Take 1 tablet (25 mg total) by mouth daily as needed for nausea or vomiting. 07/07/15  Yes Fayrene Helper, MD  sertraline (ZOLOFT) 100 MG tablet Take 100 mg by mouth in the morning and at bedtime.   Yes [provider]  simvastatin (ZOCOR) 20 MG tablet Take 20 mg by mouth at bedtime. 03/28/20  Yes [provider]  tiZANidine (ZANAFLEX) 4 MG tablet Take 8 mg by mouth 3 (three) times daily. 05/09/20  Yes [provider]  traZODone (DESYREL) 100 MG tablet Take 100-200 mg by mouth at bedtime as needed for sleep.   Yes [provider]  benzonatate (TESSALON) 200 MG capsule Take 1 capsule by mouth 3 (three) times daily. For 7 days Patient not taking: Reported on 07/15/2020 05/20/20   [provider]  clonazePAM (KLONOPIN) 1 MG tablet Take 1 mg by mouth 2 (two) times daily as needed. Patient not taking: Reported on 07/15/2020 05/20/20   [provider]  naloxone [facility]) nasal spray 4 mg/0.1 mL  06/01/20   [provider]  predniSONE (DELTASONE) 50 MG tablet Start prednisone 50 mg oral daily for 3 days, then taper by 10 mg every 3 days till tapered off. Patient not taking: Reported on 07/15/2020 05/31/20   Elgergawy, Silver Huguenin, MD     Allergies    Levofloxacin, Penicillins, Prednisone, Cephalexin, Latex, and Metoclopramide hcl  Review of Systems   Review of Systems  Constitutional: Negative for appetite change and fatigue.  HENT: Negative for congestion, ear discharge and sinus pressure.   Eyes: Negative for discharge.  Respiratory: Negative for cough.   Cardiovascular: Positive for chest pain.  Gastrointestinal: Negative for abdominal pain and diarrhea.  Genitourinary: Negative for frequency and hematuria.  Musculoskeletal: Negative for back pain.  Skin: Negative for rash.  Neurological: Negative for seizures and headaches.  Psychiatric/Behavioral: Negative for hallucinations.    Physical Exam Updated Vital Signs BP 119/60   Pulse (!) 114   Temp 100.2 F (37.9 C) (Rectal)   Resp (!) 25   Ht 5\' 2"  (1.575 m)   Wt 71.5 kg   SpO2 91%   BMI 28.83 kg/m   Physical Exam Vitals and nursing note reviewed.  Constitutional:      Appearance: She is well-developed.     Comments: Patient seems to be in severe distress  HENT:     Head: Normocephalic.     Mouth/Throat:     Mouth: Mucous membranes are moist.  Eyes:     General: No scleral icterus.    Conjunctiva/sclera: Conjunctivae normal.  Neck:     Thyroid: No thyromegaly.  Cardiovascular:     Rate and Rhythm: Normal rate and regular rhythm.     Heart sounds: No murmur heard. No friction rub. No gallop.   Pulmonary:     Breath sounds: No stridor. No wheezing or rales.  Chest:     Chest wall: No tenderness.  Abdominal:     General: There is no distension.     Tenderness: There is no abdominal tenderness. There is no rebound.  Musculoskeletal:        General: Normal range of motion.     Cervical back: Neck supple.  Lymphadenopathy:     Cervical: No cervical adenopathy.  Skin:    Findings: No erythema or rash.  Neurological:  Mental Status: She is alert and oriented to person, place, and time.     Motor: No abnormal muscle tone.      Coordination: Coordination normal.  Psychiatric:        Behavior: Behavior normal.     ED Results / Procedures / Treatments   Labs (all labs ordered are listed, but only abnormal results are displayed) Labs Reviewed  CBC WITH DIFFERENTIAL/PLATELET - Abnormal; Notable for the following components:      Result Value   WBC 17.1 (*)    Hemoglobin 11.5 (*)    Platelets 441 (*)    Neutro Abs 14.6 (*)    Basophils Absolute 0.2 (*)    All other components within normal limits  COMPREHENSIVE METABOLIC PANEL - Abnormal; Notable for the following components:   Sodium 134 (*)    Glucose, Bld 123 (*)    All other components within normal limits  BLOOD GAS, ARTERIAL - Abnormal; Notable for the following components:   pH, Arterial 7.321 (*)    pCO2 arterial 54.1 (*)    All other components within normal limits  BRAIN NATRIURETIC PEPTIDE - Abnormal; Notable for the following components:   B Natriuretic Peptide 337.0 (*)    All other components within normal limits  CULTURE, BLOOD (SINGLE)  URINE CULTURE  SARS CORONAVIRUS 2 (TAT 6-24 HRS)  LACTIC ACID, PLASMA  PROTIME-INR  APTT  LACTIC ACID, PLASMA  URINALYSIS, ROUTINE W REFLEX MICROSCOPIC  TROPONIN I (HIGH SENSITIVITY)  TROPONIN I (HIGH SENSITIVITY)    EKG None  Radiology DG Chest Port 1 View  Result Date: 07/15/2020 CLINICAL DATA:  Shortness of breath. EXAM: PORTABLE CHEST 1 VIEW COMPARISON:  CT chest 05/25/2020, chest x-ray 05/25/2020 FINDINGS: The heart size and mediastinal contours are unchanged. Enlarged pulmonary artery. Coarsened interstitial markings with patchy airspace opacities. No pleural effusion. No pneumothorax. No acute osseous abnormality. IMPRESSION: 1. Chronic interstitial disease with no superimposed diffuse patchy airspace opacities. Findings could represent infection/inflammation. COVID-19 infection not excluded. Followup PA and lateral chest X-ray is recommended in 3-4 weeks following therapy to ensure resolution  and exclude underlying malignancy. 2. Enlarged pulmonary artery consistent with pulmonary hypertension. Electronically Signed   By: Iven Finn M.D.   On: 07/15/2020 21:46    Procedures Procedures   Medications Ordered in ED Medications  meropenem (MERREM) 1 g in sodium chloride 0.9 % 100 mL IVPB (has no administration in time range)  cyclobenzaprine (FLEXERIL) tablet 10 mg (has no administration in time range)  morphine 2 MG/ML injection 2 mg (has no administration in time range)  morphine 2 MG/ML injection 2 mg (2 mg Intravenous Given 07/15/20 2108)  albuterol (PROVENTIL) (2.5 MG/3ML) 0.083% nebulizer solution 2.5 mg (2.5 mg Nebulization Given 07/15/20 2038)  ipratropium-albuterol (DUONEB) 0.5-2.5 (3) MG/3ML nebulizer solution 3 mL (3 mLs Nebulization Given 07/15/20 2038)  methylPREDNISolone sodium succinate (SOLU-MEDROL) 125 mg/2 mL injection 125 mg (125 mg Intravenous Given 07/15/20 2109)  ondansetron (ZOFRAN) injection 4 mg (4 mg Intravenous Given 07/15/20 2109)  sodium chloride 0.9 % bolus 1,000 mL (0 mLs Intravenous Stopped 07/15/20 2307)  vancomycin (VANCOCIN) IVPB 1000 mg/200 mL premix (0 mg Intravenous Stopped 07/15/20 2307)  meropenem (MERREM) 1 g in sodium chloride 0.9 % 100 mL IVPB (0 g Intravenous Stopped 07/15/20 2307)  sodium chloride 0.9 % bolus 1,000 mL (1,000 mLs Intravenous New Bag/Given 07/15/20 2250)  morphine 2 MG/ML injection 2 mg (2 mg Intravenous Given 07/15/20 2256)  iohexol (OMNIPAQUE) 350 MG/ML injection 100 mL (100 mLs  Intravenous Contrast Given 07/15/20 2341)    ED Course  I have reviewed the triage vital signs and the nursing notes.  Pertinent labs & imaging results that were available during my care of the patient were reviewed by me and considered in my medical decision making (see chart for details).    CRITICAL CARE Performed by: Milton Ferguson Total critical care time: 45 minutes Critical care time was exclusive of separately billable procedures and  treating other patients. Critical care was necessary to treat or prevent imminent or life-threatening deterioration. Critical care was time spent personally by me on the following activities: development of treatment plan with patient and/or surrogate as well as nursing, discussions with consultants, evaluation of patient's response to treatment, examination of patient, obtaining history from patient or surrogate, ordering and performing treatments and interventions, ordering and review of laboratory studies, ordering and review of radiographic studies, pulse oximetry and re-evaluation of patient's condition. Patient is a DNR DNI.  I discussed that once again today with her and she felt the same way MDM Rules/Calculators/A&P                          Patient with chest discomfort radiating through to her back.  Patient also has pulmonary fibrosis and shortness of breath. Ct angio pending Final Clinical Impression(s) / ED Diagnoses Final diagnoses:  None    Rx / DC Orders ED Discharge Orders    None       Milton Ferguson, MD 07/16/20 1524

## 2020-07-15 NOTE — ED Notes (Signed)
Pt BP reading with systolics 67f 63F, EDP made aware and to bedside. With retake, BP systolics reading in 354T. Verbal order for another 1L bolus normal saline STAT ordered and administered, pt c/o pain after morphine per MAR, orders to be placed for pain medicine. Manual BP reading 92/58.

## 2020-07-15 NOTE — Progress Notes (Signed)
Pharmacy Antibiotic Note  [name] Fitzpatrick is a 65 y.o. female admitted on 07/15/2020 with sepis.  Pharmacy has been consulted for Meropenem dosing for sepsis.   Plan: Meropenem  1g IV every 8 hours.  Monitor clinical status, renal function and culture results daily.    Height: 5\' 2"  (157.5 cm) Weight: 71.5 kg (157 lb 10.1 oz) IBW/kg (Calculated) : 50.1  Temp (24hrs), Avg:99.4 F (37.4 C), Min:99.4 F (37.4 C), Max:99.4 F (37.4 C)  Recent Labs  Lab 07/15/20 2033  WBC 17.1*  CREATININE 0.65    Estimated Creatinine Clearance: 65.8 mL/min (by C-G formula based on SCr of 0.65 mg/dL).    Allergies  Allergen Reactions  . Levofloxacin Shortness Of Breath and Itching    WHELPS WHELPS WHELPS WHELPS   . Penicillins Other (See Comments)    Stroke-like symptoms Has patient had a PCN reaction causing immediate rash, facial/tongue/throat swelling, SOB or lightheadedness with hypotension: YES Has patient had a PCN reaction causing severe rash involving mucus membranes or skin necrosis: No Has patient had a PCN reaction that required hospitalization: No Has patient had a PCN reaction occurring within the last 10 years: Yes If all of the above answers are "NO", then may proceed with Cephalosporin use.   . Prednisone Shortness Of Breath and Nausea And Vomiting  . Cephalexin Hives, Swelling and Rash  . Latex Rash and Other (See Comments)    Causes skin to tear easily  . Metoclopramide Hcl Hives and Rash    Antimicrobials this admission: Vancomycin 4/29 x1 Meropenem 4/29 >>  Dose adjustments this admission: n/a  Microbiology results:   Thank you for allowing pharmacy to be a part of this patient's care.  Nicole Cella, RPh Clinical Pharmacist 07/15/2020 9:32 PM

## 2020-07-15 NOTE — ED Notes (Signed)
Patient given neb , placed on 100 NRB mask. Saturation 100 percent oxygen drops quickly when removed from oxygen. Patient still complaining of chest pain,

## 2020-07-15 NOTE — ED Notes (Signed)
Patient transported to CT 

## 2020-07-16 DIAGNOSIS — Z79890 Hormone replacement therapy (postmenopausal): Secondary | ICD-10-CM | POA: Diagnosis not present

## 2020-07-16 DIAGNOSIS — R000 Tachycardia, unspecified: Secondary | ICD-10-CM | POA: Diagnosis not present

## 2020-07-16 DIAGNOSIS — E785 Hyperlipidemia, unspecified: Secondary | ICD-10-CM | POA: Diagnosis present

## 2020-07-16 DIAGNOSIS — Z87891 Personal history of nicotine dependence: Secondary | ICD-10-CM | POA: Diagnosis not present

## 2020-07-16 DIAGNOSIS — Z7952 Long term (current) use of systemic steroids: Secondary | ICD-10-CM | POA: Diagnosis not present

## 2020-07-16 DIAGNOSIS — M797 Fibromyalgia: Secondary | ICD-10-CM | POA: Diagnosis present

## 2020-07-16 DIAGNOSIS — G894 Chronic pain syndrome: Secondary | ICD-10-CM | POA: Diagnosis present

## 2020-07-16 DIAGNOSIS — Z20822 Contact with and (suspected) exposure to covid-19: Secondary | ICD-10-CM | POA: Diagnosis present

## 2020-07-16 DIAGNOSIS — I10 Essential (primary) hypertension: Secondary | ICD-10-CM | POA: Diagnosis present

## 2020-07-16 DIAGNOSIS — Z86718 Personal history of other venous thrombosis and embolism: Secondary | ICD-10-CM | POA: Diagnosis not present

## 2020-07-16 DIAGNOSIS — Z833 Family history of diabetes mellitus: Secondary | ICD-10-CM | POA: Diagnosis not present

## 2020-07-16 DIAGNOSIS — Z888 Allergy status to other drugs, medicaments and biological substances status: Secondary | ICD-10-CM | POA: Diagnosis not present

## 2020-07-16 DIAGNOSIS — Z981 Arthrodesis status: Secondary | ICD-10-CM | POA: Diagnosis not present

## 2020-07-16 DIAGNOSIS — Z66 Do not resuscitate: Secondary | ICD-10-CM | POA: Diagnosis present

## 2020-07-16 DIAGNOSIS — Z7189 Other specified counseling: Secondary | ICD-10-CM | POA: Diagnosis not present

## 2020-07-16 DIAGNOSIS — G40909 Epilepsy, unspecified, not intractable, without status epilepticus: Secondary | ICD-10-CM | POA: Diagnosis present

## 2020-07-16 DIAGNOSIS — J189 Pneumonia, unspecified organism: Secondary | ICD-10-CM | POA: Diagnosis present

## 2020-07-16 DIAGNOSIS — W19XXXA Unspecified fall, initial encounter: Secondary | ICD-10-CM | POA: Diagnosis present

## 2020-07-16 DIAGNOSIS — J9621 Acute and chronic respiratory failure with hypoxia: Secondary | ICD-10-CM | POA: Diagnosis present

## 2020-07-16 DIAGNOSIS — Z515 Encounter for palliative care: Secondary | ICD-10-CM | POA: Diagnosis not present

## 2020-07-16 DIAGNOSIS — Z880 Allergy status to penicillin: Secondary | ICD-10-CM | POA: Diagnosis not present

## 2020-07-16 DIAGNOSIS — E119 Type 2 diabetes mellitus without complications: Secondary | ICD-10-CM | POA: Diagnosis present

## 2020-07-16 DIAGNOSIS — K219 Gastro-esophageal reflux disease without esophagitis: Secondary | ICD-10-CM | POA: Diagnosis present

## 2020-07-16 DIAGNOSIS — Z8249 Family history of ischemic heart disease and other diseases of the circulatory system: Secondary | ICD-10-CM | POA: Diagnosis not present

## 2020-07-16 DIAGNOSIS — J440 Chronic obstructive pulmonary disease with acute lower respiratory infection: Secondary | ICD-10-CM | POA: Diagnosis present

## 2020-07-16 DIAGNOSIS — Z79899 Other long term (current) drug therapy: Secondary | ICD-10-CM | POA: Diagnosis not present

## 2020-07-16 DIAGNOSIS — Y92009 Unspecified place in unspecified non-institutional (private) residence as the place of occurrence of the external cause: Secondary | ICD-10-CM | POA: Diagnosis not present

## 2020-07-16 DIAGNOSIS — J8410 Pulmonary fibrosis, unspecified: Secondary | ICD-10-CM | POA: Diagnosis not present

## 2020-07-16 DIAGNOSIS — Z7982 Long term (current) use of aspirin: Secondary | ICD-10-CM | POA: Diagnosis not present

## 2020-07-16 DIAGNOSIS — J449 Chronic obstructive pulmonary disease, unspecified: Secondary | ICD-10-CM | POA: Diagnosis not present

## 2020-07-16 DIAGNOSIS — Z8349 Family history of other endocrine, nutritional and metabolic diseases: Secondary | ICD-10-CM | POA: Diagnosis not present

## 2020-07-16 LAB — URINALYSIS, ROUTINE W REFLEX MICROSCOPIC
Bilirubin Urine: NEGATIVE
Glucose, UA: NEGATIVE mg/dL
Hgb urine dipstick: NEGATIVE
Ketones, ur: NEGATIVE mg/dL
Leukocytes,Ua: NEGATIVE
Nitrite: NEGATIVE
Specific Gravity, Urine: 1.01 (ref 1.005–1.030)
pH: 5.5 (ref 5.0–8.0)

## 2020-07-16 LAB — BLOOD GAS, VENOUS
Acid-Base Excess: 2 mmol/L (ref 0.0–2.0)
Bicarbonate: 24.9 mmol/L (ref 20.0–28.0)
FIO2: 21
O2 Saturation: 73.9 %
Patient temperature: 37
pCO2, Ven: 57.3 mmHg (ref 44.0–60.0)
pH, Ven: 7.305 (ref 7.250–7.430)
pO2, Ven: 46.7 mmHg — ABNORMAL HIGH (ref 32.0–45.0)

## 2020-07-16 LAB — TROPONIN I (HIGH SENSITIVITY): Troponin I (High Sensitivity): 10 ng/L (ref <18)

## 2020-07-16 LAB — LACTIC ACID, PLASMA: Lactic Acid, Venous: 0.9 mmol/L (ref 0.5–1.9)

## 2020-07-16 LAB — RESP PANEL BY RT-PCR (FLU A&B, COVID) ARPGX2
Influenza A by PCR: NEGATIVE
Influenza B by PCR: NEGATIVE
SARS Coronavirus 2 by RT PCR: NEGATIVE

## 2020-07-16 LAB — D-DIMER, QUANTITATIVE: D-Dimer, Quant: 0.8 ug/mL-FEU — ABNORMAL HIGH (ref 0.00–0.50)

## 2020-07-16 LAB — URINALYSIS, MICROSCOPIC (REFLEX): Bacteria, UA: NONE SEEN

## 2020-07-16 LAB — SARS CORONAVIRUS 2 (TAT 6-24 HRS): SARS Coronavirus 2: NEGATIVE

## 2020-07-16 MED ORDER — LEVOTHYROXINE SODIUM 75 MCG PO TABS
175.0000 ug | ORAL_TABLET | Freq: Every day | ORAL | Status: DC
  Administered 2020-07-16 – 2020-07-19 (×4): 175 ug via ORAL
  Filled 2020-07-16 (×4): qty 1

## 2020-07-16 MED ORDER — SIMVASTATIN 20 MG PO TABS
20.0000 mg | ORAL_TABLET | Freq: Every day | ORAL | Status: DC
  Administered 2020-07-16 – 2020-07-18 (×3): 20 mg via ORAL
  Filled 2020-07-16 (×3): qty 1

## 2020-07-16 MED ORDER — ACETAMINOPHEN 650 MG RE SUPP: 650.0000 mg | Freq: Four times a day (QID) | RECTAL | Status: DC | PRN

## 2020-07-16 MED ORDER — SODIUM CHLORIDE 0.9 % IV SOLN
12.5000 mg | Freq: Four times a day (QID) | INTRAVENOUS | Status: DC | PRN
  Administered 2020-07-16 – 2020-07-17 (×3): 12.5 mg via INTRAVENOUS
  Filled 2020-07-16 (×2): qty 0.5

## 2020-07-16 MED ORDER — HYDROMORPHONE HCL 1 MG/ML IJ SOLN
1.0000 mg | INTRAMUSCULAR | Status: DC | PRN
  Administered 2020-07-16 – 2020-07-17 (×3): 1 mg via INTRAVENOUS
  Filled 2020-07-16 (×3): qty 1

## 2020-07-16 MED ORDER — METHYLPREDNISOLONE SODIUM SUCC 125 MG IJ SOLR
60.0000 mg | Freq: Two times a day (BID) | INTRAMUSCULAR | Status: DC
  Administered 2020-07-16 – 2020-07-19 (×7): 60 mg via INTRAVENOUS
  Filled 2020-07-16 (×7): qty 2

## 2020-07-16 MED ORDER — MORPHINE SULFATE (PF) 2 MG/ML IV SOLN
2.0000 mg | INTRAVENOUS | Status: DC | PRN
Start: 2020-07-16 — End: 2020-07-17
  Administered 2020-07-16 (×2): 2 mg via INTRAVENOUS
  Filled 2020-07-16 (×2): qty 1

## 2020-07-16 MED ORDER — ALBUTEROL SULFATE (2.5 MG/3ML) 0.083% IN NEBU
2.5000 mg | INHALATION_SOLUTION | Freq: Four times a day (QID) | RESPIRATORY_TRACT | Status: DC | PRN
  Administered 2020-07-17: 2.5 mg via RESPIRATORY_TRACT
  Filled 2020-07-16: qty 3

## 2020-07-16 MED ORDER — HEPARIN SODIUM (PORCINE) 5000 UNIT/ML IJ SOLN
5000.0000 [IU] | Freq: Three times a day (TID) | INTRAMUSCULAR | Status: DC
  Administered 2020-07-16 – 2020-07-19 (×10): 5000 [IU] via SUBCUTANEOUS
  Filled 2020-07-16 (×10): qty 1

## 2020-07-16 MED ORDER — ACETAMINOPHEN 325 MG PO TABS: 650.0000 mg | ORAL_TABLET | Freq: Four times a day (QID) | ORAL | Status: DC | PRN

## 2020-07-16 MED ORDER — PROMETHAZINE HCL 25 MG/ML IJ SOLN
INTRAMUSCULAR | Status: AC
  Filled 2020-07-16: qty 1

## 2020-07-16 MED ORDER — ASPIRIN EC 81 MG PO TBEC
81.0000 mg | DELAYED_RELEASE_TABLET | Freq: Every day | ORAL | Status: DC
  Administered 2020-07-16 – 2020-07-19 (×4): 81 mg via ORAL
  Filled 2020-07-16 (×4): qty 1

## 2020-07-16 MED ORDER — CHLORHEXIDINE GLUCONATE CLOTH 2 % EX PADS
6.0000 | MEDICATED_PAD | Freq: Every day | CUTANEOUS | Status: DC
  Administered 2020-07-16 – 2020-07-18 (×3): 6 via TOPICAL

## 2020-07-16 MED ORDER — BENZONATATE 100 MG PO CAPS
200.0000 mg | ORAL_CAPSULE | Freq: Three times a day (TID) | ORAL | Status: DC
  Administered 2020-07-16 – 2020-07-19 (×10): 200 mg via ORAL
  Filled 2020-07-16 (×10): qty 2

## 2020-07-16 MED ORDER — MORPHINE SULFATE ER 15 MG PO TBCR
15.0000 mg | EXTENDED_RELEASE_TABLET | Freq: Two times a day (BID) | ORAL | Status: DC
  Administered 2020-07-16 – 2020-07-19 (×7): 15 mg via ORAL
  Filled 2020-07-16 (×7): qty 1

## 2020-07-16 MED ORDER — PANTOPRAZOLE SODIUM 40 MG PO TBEC
40.0000 mg | DELAYED_RELEASE_TABLET | Freq: Two times a day (BID) | ORAL | Status: DC
  Administered 2020-07-16 – 2020-07-19 (×7): 40 mg via ORAL
  Filled 2020-07-16 (×7): qty 1

## 2020-07-16 MED ORDER — HYDROMORPHONE HCL 4 MG PO TABS
4.0000 mg | ORAL_TABLET | ORAL | Status: DC | PRN
  Administered 2020-07-16 – 2020-07-18 (×3): 4 mg via ORAL
  Filled 2020-07-16 (×3): qty 1

## 2020-07-16 MED ORDER — MORPHINE SULFATE (PF) 4 MG/ML IV SOLN
4.0000 mg | Freq: Once | INTRAVENOUS | Status: AC
  Administered 2020-07-16: 4 mg via INTRAVENOUS
  Filled 2020-07-16: qty 1

## 2020-07-16 MED ORDER — ALPRAZOLAM 0.5 MG PO TABS
0.5000 mg | ORAL_TABLET | Freq: Two times a day (BID) | ORAL | Status: DC | PRN
  Administered 2020-07-17 (×2): 0.5 mg via ORAL
  Filled 2020-07-16 (×2): qty 1

## 2020-07-16 MED ORDER — FLUTICASONE PROPIONATE 50 MCG/ACT NA SUSP
2.0000 | Freq: Every day | NASAL | Status: DC
  Administered 2020-07-16 – 2020-07-19 (×4): 2 via NASAL
  Filled 2020-07-16: qty 16

## 2020-07-16 MED ORDER — IPRATROPIUM-ALBUTEROL 0.5-2.5 (3) MG/3ML IN SOLN: 3.0000 mL | Freq: Four times a day (QID) | RESPIRATORY_TRACT | Status: DC | PRN

## 2020-07-16 MED ORDER — POLYETHYLENE GLYCOL 3350 17 G PO PACK: 17.0000 g | PACK | Freq: Every day | ORAL | Status: DC | PRN

## 2020-07-16 MED ORDER — IPRATROPIUM-ALBUTEROL 0.5-2.5 (3) MG/3ML IN SOLN
3.0000 mL | Freq: Four times a day (QID) | RESPIRATORY_TRACT | Status: DC
  Administered 2020-07-16 – 2020-07-19 (×12): 3 mL via RESPIRATORY_TRACT
  Filled 2020-07-16 (×11): qty 3

## 2020-07-16 MED ORDER — TRAZODONE HCL 50 MG PO TABS: 100.0000 mg | ORAL_TABLET | Freq: Every evening | ORAL | Status: DC | PRN

## 2020-07-16 MED ORDER — GABAPENTIN 100 MG PO CAPS
100.0000 mg | ORAL_CAPSULE | Freq: Three times a day (TID) | ORAL | Status: DC
  Administered 2020-07-16 – 2020-07-17 (×6): 100 mg via ORAL
  Filled 2020-07-16 (×5): qty 1

## 2020-07-16 MED ORDER — TIZANIDINE HCL 2 MG PO TABS
8.0000 mg | ORAL_TABLET | Freq: Three times a day (TID) | ORAL | Status: DC
  Administered 2020-07-16 – 2020-07-19 (×10): 8 mg via ORAL
  Filled 2020-07-16 (×8): qty 4

## 2020-07-16 MED ORDER — BUDESONIDE 0.25 MG/2ML IN SUSP
0.2500 mg | Freq: Two times a day (BID) | RESPIRATORY_TRACT | Status: DC
  Administered 2020-07-16 – 2020-07-19 (×7): 0.25 mg via RESPIRATORY_TRACT
  Filled 2020-07-16 (×8): qty 2

## 2020-07-16 MED ORDER — SERTRALINE HCL 50 MG PO TABS
100.0000 mg | ORAL_TABLET | Freq: Every day | ORAL | Status: DC
  Administered 2020-07-16 – 2020-07-19 (×4): 100 mg via ORAL
  Filled 2020-07-16 (×4): qty 2

## 2020-07-16 NOTE — Plan of Care (Signed)
  Problem: Acute Rehab PT Goals(only PT should resolve) Goal: Patient Will Transfer Sit To/From Stand Outcome: Progressing Flowsheets (Taken 07/16/2020 1214) Patient will transfer sit to/from stand: with modified independence Goal: Pt Will Transfer Bed To Chair/Chair To Bed Outcome: Progressing Flowsheets (Taken 07/16/2020 1214) Pt will Transfer Bed to Chair/Chair to Bed: with modified independence Goal: Pt Will Ambulate Outcome: Progressing Flowsheets (Taken 07/16/2020 1214) Pt will Ambulate:  50 feet  with supervision  with rolling walker   12:14 PM, 07/16/20 M. Sherlyn Lees, PT, DPT Physical Therapist- [place] Office Number: [phone]

## 2020-07-16 NOTE — H&P (Signed)
TRH H&P    Patient Demographics:    [name] Fitzpatrick, is a 65 y.o. female  MRN: [id number]  DOB - 07-01-1955  Admit Date - 07/15/2020  Referring MD/NP/PA: Betsey Holiday  Outpatient Primary MD for the patient is Lilian Coma., MD  Patient coming from: Home  Chief complaint- dyspnea and chest pain   HPI:    [name] Fitzpatrick  is a 65 y.o. female, with history of thyroiditis status post thyroidectomy, seizure disorder, hypertension, pulmonary fibrosis-end-stage, diabetes mellitus type 2, COPD, and more presents the ED with a chief complaint of dyspnea and chest pain.  Patient reports it was acute in onset on the day of presentation.  She reports that happened when she was just getting up off the couch.  She has had intermittent squeezing and pressure type chest pain that radiates into her left neck.  She reports that it is exertional, but not completely relieved at rest.  She reports a productive cough with thick sputum.  She has chest pain, rib pain, abdominal pain when she coughs.  She reports an intermittent fever and chills.  She has had a decreased appetite and reports that she has not eaten much in 2 weeks.  She has nausea which she takes Phenergan for at home.  She denies any dysuria, but admits to decreased urine output.  She did have a fall with a bruise on her right posterior ribs, and reports that this pain is worse because she has been breathing so hard.  Patient reports that she is too distressed to participate in the medical interview further.  Of note patient reports that she did have a blood clot in 2019 at [facility].  She does not seem to be on a blood thinner.  Patient does not smoke, does not drink alcohol, does not use illicit drugs.  She is vaccinated for COVID.  Patient is DNR and DNI.  In the ED Temp 99.2, heart rate 92-122, respiratory rate 16-28, blood pressure seems to be inaccurate readings around  64/21 more consistently around 106/67, satting at 100% on 15 L  Patient is on 6 L at baseline at home She was as low as 84% on 12 L at arrival Patient has a leukocytosis with white blood cell count of 17.1, hemoglobin 11.5, chemistry panel is mostly unremarkable EKG shows a heart rate of 96, sinus rhythm, QTC 498 Troponin is 7 and then 10 Patient was given albuterol, Flexeril, DuoNeb, Merrem, Solu-Medrol, morphine total 6 mg, 2 L bolus, 4 mg of Zofran, and vancomycin in the ED CTA of abdomen and pelvis shows no aneurysmal dilatation or dissection.  Diffuse chronic interstitial lung disease with worsening superimposed marked severity diffuse bilateral infiltrates Negative respiratory panel Lactic acid 0.9 BNP 05/26/1935 pH 7.32, PCO2 54-chronic retainer    Review of systems:    In addition to the HPI above,  Admits to fever and chills No Headache, No changes with Vision or hearing, No problems swallowing food or Liquids, Admits to chest pain, cough, shortness of breath No Abdominal pain, admits to nausea, but  no vomiting, bowel movements are regular, No Blood in stool or Urine, No dysuria, admits to decrease in urine output No new skin rashes or bruises, No new joints pains-aches,  No new weakness, tingling, numbness in any extremity, No recent weight gain or loss, No polyuria, polydypsia or polyphagia, No significant Mental Stressors.  All other systems reviewed and are negative.    Past History of the following :    Past Medical History:  Diagnosis Date  . Acute deep vein thrombosis (DVT) of left femoral vein ([place])   . Acute on chronic respiratory failure with hypoxia ([place])   . Anxiety   . ARDS (adult respiratory distress syndrome) ([place])   . Chronic LBP   . Chronic neck pain   . COPD (chronic obstructive pulmonary disease) ([place])   . Depression   . DM type 2 (diabetes mellitus, type 2) ([place])   . Dyspnea 05/25/2020  . Fibromyalgia   . GERD (gastroesophageal reflux  disease)   . H/O pulmonary fibrosis   . H/O suicide attempt   . Hashimoto's thyroiditis   . Hypertension   . Hypothyroidism   . IBS (irritable bowel syndrome)    with constipation  . Interstitial pneumonia ([place])   . Migraine headache   . Osteoarthritis   . Seizure disorder ([place])   . 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 ABDOMINAL HYSTERECTOMY    . b/l foot surgery --bunions    . BIOPSY  07/29/2015   Procedure: BIOPSY;  Surgeon: Rogene Houston, MD;  Location[facility];  Service: Endoscopy;;  Duodenal,  gastric, and esophageal  biopsies  . C-Spine fusion  1991, 2004  . CHOLECYSTECTOMY    . COLONOSCOPY WITH PROPOFOL N/A 07/29/2015   Procedure: COLONOSCOPY WITH PROPOFOL;  Surgeon: Rogene Houston, MD;  Location: [facility];  Service: Endoscopy;  Laterality: N/A;  8:30  . ESOPHAGEAL DILATION N/A 07/29/2015   Procedure: ESOPHAGEAL DILATION;  Surgeon: Rogene Houston, MD;  Location[facility];  Service: Endoscopy;  Laterality: N/A;  . ESOPHAGOGASTRODUODENOSCOPY (EGD) WITH PROPOFOL N/A 07/29/2015   Procedure: ESOPHAGOGASTRODUODENOSCOPY (EGD) WITH PROPOFOL;  Surgeon: Rogene Houston, MD;  [facility];  Service: Endoscopy;  Laterality: N/A;  . l carpal tunnel release Right   . l spine fusion x 2    . reconstucted right ankle    . Right knee arthroscopic surgery    . total reconstruction of right ankle and heel     total of three   . VESICOVAGINAL FISTULA CLOSURE W/ TAH  1993   Fibroids no Ca      Social History:      Social History   Tobacco Use  . Smoking status: Former Smoker    Packs/day: 0.30    Years: 40.00    Pack years: 12.00    Types: Cigarettes    Quit date: 07/24/2012    Years since quitting: 7.9  . Smokeless tobacco: Never Used  Substance Use Topics  . Alcohol use: No       Family History :     Family History  Problem Relation Age of  Onset  . Cancer Father        pancreatic   . Pancreatic cancer Father   . Heart failure Mother   . GI problems Mother   . Heart disease Mother   . Diabetes Mother   .  Thyroid disease Sister   . Cancer Maternal Grandfather   . Cancer Other        family history       Home Medications:   Prior to Admission medications   Medication Sig Start Date End Date Taking? Authorizing Provider  acetaminophen (TYLENOL) 500 MG tablet Take 1,000 mg by mouth every 6 (six) hours as needed.   Yes [provider]  alendronate (FOSAMAX) 70 MG tablet Take 70 mg by mouth once a week. 04/18/20  Yes [provider]  ALPRAZolam Duanne Moron) 0.5 MG tablet Take 0.5 mg by mouth 2 (two) times daily as needed. 05/20/20  Yes [provider]  aspirin EC 81 MG tablet Take 81 mg by mouth daily.   Yes [provider]  dicyclomine (BENTYL) 10 MG capsule TAKE 1 CAPSULE BY MOUTH TWICE DAILY. 09/03/14  Yes Fayrene Helper, MD  fluticasone [facility]) 50 MCG/ACT nasal spray PLACE 2 SPRAYS INTO BOTH NOSTRILS DAILY 04/11/16  Yes Fayrene Helper, MD  gabapentin (NEURONTIN) 100 MG capsule Take 100 mg by mouth 3 (three) times daily. 05/10/20  Yes [provider]  HYDROmorphone (DILAUDID) 4 MG tablet Take 4 mg by mouth every 4 (four) hours as needed for moderate pain. 03/23/15  Yes [provider]  ipratropium-albuterol (DUONEB) 0.5-2.5 (3) MG/3ML SOLN Take 3 mLs by nebulization every 6 (six) hours as needed. 05/31/20  Yes Elgergawy, Silver Huguenin, MD  levothyroxine (SYNTHROID) 175 MCG tablet Take 175 mcg by mouth daily. 03/31/20  Yes [provider]  MS CONTIN 15 MG 12 hr tablet Take 15 mg by mouth 2 (two) times daily. 06/27/20  Yes [provider]  pantoprazole (PROTONIX) 40 MG tablet Take 40 mg by mouth 2 (two) times daily. 05/20/20  Yes [provider]  promethazine (PHENERGAN) 25 MG tablet Take 1 tablet (25 mg total) by mouth daily as needed for nausea or  vomiting. 07/07/15  Yes Fayrene Helper, MD  sertraline (ZOLOFT) 100 MG tablet Take 100 mg by mouth in the morning and at bedtime.   Yes [provider]  simvastatin (ZOCOR) 20 MG tablet Take 20 mg by mouth at bedtime. 03/28/20  Yes [provider]  tiZANidine (ZANAFLEX) 4 MG tablet Take 8 mg by mouth 3 (three) times daily. 05/09/20  Yes [provider]  traZODone (DESYREL) 100 MG tablet Take 100-200 mg by mouth at bedtime as needed for sleep.   Yes [provider]  benzonatate (TESSALON) 200 MG capsule Take 1 capsule by mouth 3 (three) times daily. For 7 days Patient not taking: Reported on 07/15/2020 05/20/20   [provider]  clonazePAM (KLONOPIN) 1 MG tablet Take 1 mg by mouth 2 (two) times daily as needed. Patient not taking: Reported on 07/15/2020 05/20/20   [provider]  naloxone [facility]) nasal spray 4 mg/0.1 mL  06/01/20   [provider]  predniSONE (DELTASONE) 50 MG tablet Start prednisone 50 mg oral daily for 3 days, then taper by 10 mg every 3 days till tapered off. Patient not taking: Reported on 07/15/2020 05/31/20   Elgergawy, Silver Huguenin, MD     Allergies:     Allergies  Allergen Reactions  . Levofloxacin Shortness Of Breath and Itching    WHELPS WHELPS WHELPS WHELPS   . Penicillins Other (See Comments)    Stroke-like symptoms Has patient had a PCN reaction causing immediate rash, facial/tongue/throat swelling, SOB or lightheadedness with hypotension: YES Has patient had a PCN reaction causing severe  rash involving mucus membranes or skin necrosis: No Has patient had a PCN reaction that required hospitalization: No Has patient had a PCN reaction occurring within the last 10 years: Yes If all of the above answers are "NO", then may proceed with Cephalosporin use.   . Prednisone Shortness Of Breath and Nausea And Vomiting  . Cephalexin Hives, Swelling and Rash  . Latex Rash and Other (See Comments)    Causes  skin to tear easily  . Metoclopramide Hcl Hives and Rash     Physical Exam:   Vitals  Blood pressure (!) 111/57, pulse 87, temperature 99.2 F (37.3 C), temperature source Oral, resp. rate 19, height 5\' 2"  (1.575 m), weight 71.5 kg, SpO2 100 %.  1.  General: Patient is writhing around on the gurney in pain, supine in bed  2. Psychiatric: Mood is anxious, irritable, behavior is normal for situation, oriented x3  3. Neurologic: Moving all 4 extremities voluntarily, face symmetric, speech and language are normal, oriented x3, no acute deficit on limited exam  4. HEENMT:  Head is atraumatic, normocephalic, pupils reactive to light, neck is supple, trachea is midline, mucous membranes are mildly dry  5. Respiratory : Increased work of breathing, accessory muscle use, at times gasping for air, high flow nasal cannula 15 L, rhonchi more on the left than the right, no crackles, no wheezes  6. Cardiovascular : Heart rate is tachycardic, rhythm is regular, no murmurs rubs or gallops, peripheral edema present  7. Gastrointestinal:  Abdomen is soft, nondistended, nontender to palpation, no palpable masses, bowel sounds normal  8. Skin:  Skin is warm dry and intact without acute lesion on limited exam  9.Musculoskeletal:  No acute deformity, no calf tenderness, peripheral pulses palpated    Data Review:    CBC Recent Labs  Lab 07/15/20 2033  WBC 17.1*  HGB 11.5*  HCT 37.9  PLT 441*  MCV 91.3  MCH 27.7  MCHC 30.3  RDW 14.5  LYMPHSABS 1.2  MONOABS 0.7  EOSABS 0.5  BASOSABS 0.2*   ------------------------------------------------------------------------------------------------------------------  Results for orders placed or performed during the hospital encounter of 07/15/20 (from the past 48 hour(s))  CBC with Differential/Platelet     Status: Abnormal   Collection Time: 07/15/20  8:33 PM  Result Value Ref Range   WBC 17.1 (H) 4.0 - 10.5 K/uL   RBC 4.15 3.87 - 5.11  MIL/uL   Hemoglobin 11.5 (L) 12.0 - 15.0 g/dL   HCT 37.9 36.0 - 46.0 %   MCV 91.3 80.0 - 100.0 fL   MCH 27.7 26.0 - 34.0 pg   MCHC 30.3 30.0 - 36.0 g/dL   RDW 14.5 11.5 - 15.5 %   Platelets 441 (H) 150 - 400 K/uL   nRBC 0.0 0.0 - 0.2 %   Neutrophils Relative % 85 %   Neutro Abs 14.6 (H) 1.7 - 7.7 K/uL   Lymphocytes Relative 7 %   Lymphs Abs 1.2 0.7 - 4.0 K/uL   Monocytes Relative 4 %   Monocytes Absolute 0.7 0.1 - 1.0 K/uL   Eosinophils Relative 3 %   Eosinophils Absolute 0.5 0.0 - 0.5 K/uL   Basophils Relative 1 %   Basophils Absolute 0.2 (H) 0.0 - 0.1 K/uL   Immature Granulocytes 0 %   Abs Immature Granulocytes 0.05 0.00 - 0.07 K/uL    Comment: Performed at [facility], [address], [place], [place] [address]  Comprehensive metabolic panel     Status: Abnormal   Collection  Time: 07/15/20  8:33 PM  Result Value Ref Range   Sodium 134 (L) 135 - 145 mmol/L   Potassium 3.7 3.5 - 5.1 mmol/L   Chloride 98 98 - 111 mmol/L   CO2 26 22 - 32 mmol/L   Glucose, Bld 123 (H) 70 - 99 mg/dL    Comment: Glucose reference range applies only to samples taken after fasting for at least 8 hours.   BUN 12 8 - 23 mg/dL   Creatinine, Ser 0.65 0.44 - 1.00 mg/dL   Calcium 9.1 8.9 - 10.3 mg/dL   Total Protein 7.8 6.5 - 8.1 g/dL   Albumin 3.6 3.5 - 5.0 g/dL   AST 15 15 - 41 U/L   ALT 10 0 - 44 U/L   Alkaline Phosphatase 73 38 - 126 U/L   Total Bilirubin 0.6 0.3 - 1.2 mg/dL   GFR, Estimated >60 >60 mL/min    Comment: (NOTE) Calculated using the CKD-EPI Creatinine Equation (2021)    Anion gap 10 5 - 15    Comment: Performed at [facility], [address] [place], [place] [address]  Troponin I (High Sensitivity)     Status: None   Collection Time: 07/15/20  8:33 PM  Result Value Ref Range   Troponin I (High Sensitivity) 7 <18 ng/L    Comment: (NOTE) Elevated high sensitivity troponin I (hsTnI) values and significant  changes across serial measurements may suggest ACS but many other   chronic and acute conditions are known to elevate hsTnI results.  Refer to the "Links" section for chest pain algorithms and additional  guidance. Performed at [facility], [address] [place], [place] [address]   Blood gas, arterial     Status: Abnormal   Collection Time: 07/15/20  9:21 PM  Result Value Ref Range   FIO2 100.00    pH, Arterial 7.321 (L) 7.350 - 7.450   pCO2 arterial 54.1 (H) 32.0 - 48.0 mmHg   pO2, Arterial 106 83.0 - 108.0 mmHg   Bicarbonate 25.3 20.0 - 28.0 mmol/L   Acid-Base Excess 1.6 0.0 - 2.0 mmol/L   O2 Saturation 96.6 %   Patient temperature 37.4    Allens test (pass/fail) PASS PASS    Comment: Performed at [facility], [address], [place], [place] [address]  Blood culture (routine single)     Status: None (Preliminary result)   Collection Time: 07/15/20  9:44 PM   Specimen: BLOOD LEFT HAND  Result Value Ref Range   Specimen Description BLOOD LEFT HAND    Special Requests      Blood Culture results may not be optimal due to an excessive volume of blood received in culture bottles BOTTLES DRAWN AEROBIC AND ANAEROBIC Performed at [facility], [address] [place], [place] [address]    Culture PENDING    Report Status PENDING   Brain natriuretic peptide     Status: Abnormal   Collection Time: 07/15/20  9:44 PM  Result Value Ref Range   B Natriuretic Peptide 337.0 (H) 0.0 - 100.0 pg/mL    Comment: Performed at [facility], [address], [place], [place] [address]  Lactic acid, plasma     Status: None   Collection Time: 07/15/20  9:45 PM  Result Value Ref Range   Lactic Acid, Venous 0.7 0.5 - 1.9 mmol/L    Comment: Performed at [facility], [address] [place], [place] [address]  Protime-INR     Status: None   Collection Time: 07/15/20  9:45 PM  Result Value Ref Range   Prothrombin Time 13.6 11.4 - 15.2 seconds   INR 1.0 0.8 - 1.2    Comment: (NOTE) INR goal varies based on device and disease states. Performed at [facility], [address] [place] [address], [place], [place] [address]   APTT     Status: None   Collection Time: 07/15/20  9:45 PM  Result Value Ref Range   aPTT 31 24 - 36 seconds    Comment: Performed at [facility] [place] [facility], [address], [place], [place] [address]  Lactic acid, plasma     Status: None   Collection Time: 07/15/20 11:50 PM  Result Value Ref Range   Lactic Acid, Venous 0.9 0.5 - 1.9 mmol/L    Comment: Performed at [facility], [address] [place], [place] [address]  Troponin I (High Sensitivity)     Status: None   Collection Time: 07/15/20 11:50 PM  Result Value Ref Range   Troponin I (High Sensitivity) 10 <18 ng/L    Comment: (NOTE) Elevated high sensitivity troponin I (hsTnI) values and significant  changes across serial measurements may suggest ACS but many other  chronic and acute conditions are known to elevate hsTnI results.  Refer to the "Links" section for chest pain algorithms and additional  guidance. Performed at [facility], [address] [place], [place] [address]   Resp Panel by RT-PCR (Flu A&B, Covid) Nasopharyngeal Swab     Status: None   Collection Time: 07/16/20  1:48 AM   Specimen: Nasopharyngeal Swab; Nasopharyngeal(NP) swabs in vial transport medium  Result Value Ref Range   SARS Coronavirus 2 by RT PCR NEGATIVE NEGATIVE    Comment: (NOTE) SARS-CoV-2 target nucleic acids are NOT DETECTED.  The SARS-CoV-2 RNA is generally detectable in upper respiratory specimens during the acute phase of infection. The lowest concentration of SARS-CoV-2 viral copies this assay can detect is 138 copies/mL. A negative result does not preclude SARS-Cov-2 infection and should not be used as the sole basis for treatment or other patient management decisions. A negative result may occur with  improper specimen collection/handling, submission of specimen other than nasopharyngeal swab, presence of viral mutation(s) within the areas targeted by this assay, and inadequate number of  viral copies(<138 copies/mL). A negative result must be combined with clinical observations, patient history, and epidemiological information. The expected result is Negative.  Fact Sheet for Patients:  EntrepreneurPulse.com.au  Fact Sheet for Healthcare Providers:  IncredibleEmployment.be  This test is no t yet approved or cleared by the [place] FDA and  has been authorized for detection and/or diagnosis of SARS-CoV-2 by FDA under an Emergency Use Authorization (EUA). This EUA will remain  in effect (meaning this test can be used) for the duration of the COVID-19 declaration under Section 564(b)(1) of the Act, 21 U.S.C.section 360bbb-3(b)(1), unless the authorization is terminated  or revoked sooner.       Influenza A by PCR NEGATIVE NEGATIVE   Influenza B by PCR NEGATIVE NEGATIVE    Comment: (NOTE) The Xpert Xpress SARS-CoV-2/FLU/RSV plus assay is intended as an aid in the diagnosis of influenza from Nasopharyngeal swab specimens and should not be used as a sole basis for treatment. Nasal washings and aspirates are unacceptable for Xpert Xpress SARS-CoV-2/FLU/RSV testing.  Fact Sheet for Patients: EntrepreneurPulse.com.au  Fact Sheet for Healthcare Providers: IncredibleEmployment.be  This test is not yet approved or cleared by the [place] FDA and has been authorized for detection and/or diagnosis of SARS-CoV-2 by FDA  under an Emergency Use Authorization (EUA). This EUA will remain in effect (meaning this test can be used) for the duration of the COVID-19 declaration under Section 564(b)(1) of the Act, 21 U.S.C. section 360bbb-3(b)(1), unless the authorization is terminated or revoked.  Performed at [facility], [address], [place], [place] [address]     Chemistries  Recent Labs  Lab 07/15/20 2033  NA 134*  K 3.7  CL 98  CO2 26  GLUCOSE 123*  BUN 12  CREATININE 0.65   CALCIUM 9.1  AST 15  ALT 10  ALKPHOS 73  BILITOT 0.6   ------------------------------------------------------------------------------------------------------------------  ------------------------------------------------------------------------------------------------------------------ GFR: Estimated Creatinine Clearance: 65.8 mL/min (by C-G formula based on SCr of 0.65 mg/dL). Liver Function Tests: Recent Labs  Lab 07/15/20 2033  AST 15  ALT 10  ALKPHOS 73  BILITOT 0.6  PROT 7.8  ALBUMIN 3.6   No results for input(s): LIPASE, AMYLASE in the last 168 hours. No results for input(s): AMMONIA in the last 168 hours. Coagulation Profile: Recent Labs  Lab 07/15/20 2145  INR 1.0   Cardiac Enzymes: No results for input(s): CKTOTAL, CKMB, CKMBINDEX, TROPONINI in the last 168 hours. BNP (last 3 results) No results for input(s): PROBNP in the last 8760 hours. HbA1C: No results for input(s): HGBA1C in the last 72 hours. CBG: No results for input(s): GLUCAP in the last 168 hours. Lipid Profile: No results for input(s): CHOL, HDL, LDLCALC, TRIG, CHOLHDL, LDLDIRECT in the last 72 hours. Thyroid Function Tests: No results for input(s): TSH, T4TOTAL, FREET4, T3FREE, THYROIDAB in the last 72 hours. Anemia Panel: No results for input(s): VITAMINB12, FOLATE, FERRITIN, TIBC, IRON, RETICCTPCT in the last 72 hours.  --------------------------------------------------------------------------------------------------------------- Urine analysis:    Component Value Date/Time   COLORURINE STRAW (A) 05/25/2020 2007   APPEARANCEUR CLEAR 05/25/2020 2007   LABSPEC 1.031 (H) 05/25/2020 2007   PHURINE 7.0 05/25/2020 2007   GLUCOSEU NEGATIVE 05/25/2020 2007   HGBUR NEGATIVE 05/25/2020 2007   HGBUR negative 05/17/2006 0000   BILIRUBINUR NEGATIVE 05/25/2020 2007   BILIRUBINUR small 03/30/2015 1546   KETONESUR NEGATIVE 05/25/2020 2007   PROTEINUR NEGATIVE 05/25/2020 2007   UROBILINOGEN 0.2  03/30/2015 1546   UROBILINOGEN 0.2 11/08/2013 0237   NITRITE NEGATIVE 05/25/2020 2007   LEUKOCYTESUR TRACE (A) 05/25/2020 2007      Imaging Results:    DG Chest Port 1 View  Result Date: 07/15/2020 CLINICAL DATA:  Shortness of breath. EXAM: PORTABLE CHEST 1 VIEW COMPARISON:  CT chest 05/25/2020, chest x-ray 05/25/2020 FINDINGS: The heart size and mediastinal contours are unchanged. Enlarged pulmonary artery. Coarsened interstitial markings with patchy airspace opacities. No pleural effusion. No pneumothorax. No acute osseous abnormality. IMPRESSION: 1. Chronic interstitial disease with no superimposed diffuse patchy airspace opacities. Findings could represent infection/inflammation. COVID-19 infection not excluded. Followup PA and lateral chest X-ray is recommended in 3-4 weeks following therapy to ensure resolution and exclude underlying malignancy. 2. Enlarged pulmonary artery consistent with pulmonary hypertension. Electronically Signed   By: Iven Finn M.D.   On: 07/15/2020 21:46   CT Angio Chest/Abd/Pel for Dissection W and/or Wo Contrast  Result Date: 07/16/2020 CLINICAL DATA:  Chest pain radiating to the right-side. EXAM: CT ANGIOGRAPHY CHEST, ABDOMEN AND PELVIS TECHNIQUE: Non-contrast CT of the chest was initially obtained. Multidetector CT imaging through the chest, abdomen and pelvis was performed using the standard protocol during bolus administration of intravenous contrast. Multiplanar reconstructed images and MIPs were obtained and reviewed to evaluate the vascular anatomy. CONTRAST:  158mL OMNIPAQUE IOHEXOL  350 MG/ML SOLN COMPARISON:  May 25, 2020 FINDINGS: CTA CHEST FINDINGS Cardiovascular: There is mild to moderate severity calcification of the aortic arch, without evidence of aortic aneurysm or dissection. Satisfactory opacification of the pulmonary arteries to the segmental level. No evidence of pulmonary embolism. Normal heart size. No pericardial effusion.  Mediastinum/Nodes: A 2.4 cm x 1.5 cm AP window lymph node is seen. This is increased in size when compared to the prior study. 1.9 cm x 1.8 cm and 2.4 cm x 2.2 cm right hilar lymph nodes are also noted. These are also increased in size. Stable left hilar lymphadenopathy is present. Thyroid gland, trachea, and esophagus demonstrate no significant findings. Lungs/Pleura: Marked severity diffuse bilateral infiltrates are seen. This is increased in severity when compared to the prior study. Diffuse, chronic interstitial lung disease is also noted, bilaterally with persistent areas of bilateral bronchiectasis. There is no evidence of a pleural effusion or pneumothorax. Musculoskeletal: Multilevel degenerative changes seen throughout the thoracic spine. Review of the MIP images confirms the above findings. CTA ABDOMEN AND PELVIS FINDINGS VASCULAR Aorta: Normal caliber aorta without aneurysm, dissection, vasculitis or significant stenosis. Celiac: Patent without evidence of aneurysm, dissection, vasculitis or significant stenosis. SMA: Patent without evidence of aneurysm, dissection, vasculitis or significant stenosis. Renals: Both renal arteries are patent without evidence of aneurysm, dissection, vasculitis, fibromuscular dysplasia or significant stenosis. IMA: Patent without evidence of aneurysm, dissection, vasculitis or significant stenosis. Inflow: Patent without evidence of aneurysm, dissection, vasculitis or significant stenosis. Veins: An inferior vena cava filter is in place. Review of the MIP images confirms the above findings. NON-VASCULAR Hepatobiliary: No focal liver abnormality is seen. Status post cholecystectomy. No biliary dilatation. Pancreas: Unremarkable. No pancreatic ductal dilatation or surrounding inflammatory changes. Spleen: Subcentimeter calcified granulomas are seen within the spleen. Adrenals/Urinary Tract: Adrenal glands are unremarkable. Kidneys are normal, without renal calculi, focal  lesion, or hydronephrosis. Bladder is unremarkable. Stomach/Bowel: Stomach is within normal limits. The appendix is not clearly identified. No evidence of bowel wall thickening, distention, or inflammatory changes. Lymphatic: No abnormal abdominal or pelvic lymph nodes are identified. Reproductive: Status post hysterectomy. No adnexal masses. Other: No abdominal wall hernia or abnormality. No abdominopelvic ascites. Musculoskeletal: There is evidence of prior vertebroplasty at the level of L1. Bilateral pedicle screws are seen at the levels of L4, L5 and S1 with operative material also seen within the intervertebral disc spaces at these levels. Review of the MIP images confirms the above findings. IMPRESSION: 1. No evidence of aneurysmal dilatation or dissection of the thoracic or abdominal aorta. 2. Diffuse chronic interstitial lung disease with worsening, superimposed marked severity diffuse bilateral infiltrates. 3. Worsening mediastinal and right hilar lymphadenopathy. 4. Evidence of prior cholecystectomy and hysterectomy. 5. Postoperative changes within the lower lumbar spine. Electronically Signed   By: Aram Candelahaddeus  Houston M.D.   On: 07/16/2020 00:59       Assessment & Plan:    Active Problems:   Fall at home, initial encounter   Acute on chronic respiratory failure with hypoxia (HCC)   Community acquired pneumonia   Tachycardia   1. Acute on chronic respiratory failure with hypoxia 1. Patient requiring 6 L nasal cannula at home 2. O2 sats documented as low as 84% on 12 L nasal cannula here 3. Currently requiring 15 L 4. Patient received albuterol Solu-Medrol and morphine in the ED 5. Not likely a COPD exacerbation as the VBG is indicative of chronic retention with pH 7.32, PCO2 54 6. Most likely secondary to pneumonia  7. PE is also on the differential with a tachycardia of 122, BP 64/21, hypoxia -D-dimer pending 8. Morphine for dyspnea or pain 9. Continue to monitor 2. Chest  pain 1. Most likely from pneumonia 2. PE is also on the differential 3. Troponin negative, EKG does not show ischemic changes 4. D-dimer pending -likely need CTA chest 3. Community-acquired pneumonia 1. Patient with significant lung disease has bilateral infiltrates on x-ray 2. She did have a fall with bruised ribs which is likely causing hypoventilation at home contributing to pneumonia 3. Merrem started in ER-continue 4. Negative respiratory panel 5. Sputum culture pending 6. Continue monitor 4. Fall 1. PT eval and treat 2. Will likely require CTA chest which then evaluate ribs for osseous abnormality 3. Continue monitoring 5. Tachycardia 1. Patient has a history of tachycardia, but with the possible hypotension (with her writhing all over the bed it is difficult to know if the blood pressure is accurate) and hypoxia as well as chest pain and D-dimer 2. If D-dimer elevated will proceed to CTA chest   DVT Prophylaxis-   Heparin- SCDs  AM Labs Ordered, also please review Full Orders  Family Communication: No family at bedside  Code Status: DNR  Admission status: Inpatient :The appropriate admission status for this patient is INPATIENT. Inpatient status is judged to be reasonable and necessary in order to provide the required intensity of service to ensure the patient's safety. The patient's presenting symptoms, physical exam findings, and initial radiographic and laboratory data in the context of their chronic comorbidities is felt to place them at high risk for further clinical deterioration. Furthermore, it is not anticipated that the patient will be medically stable for discharge from the hospital within 2 midnights of admission. The following factors support the admission status of inpatient.     The patient's presenting symptoms include dyspnea and chest pain The worrisome physical exam findings include hypoxia The initial radiographic and laboratory data are worrisome because  of bilateral infiltrates lower lungs The chronic co-morbidities include pulmonary fibrosis COPD       * I certify that at the point of admission it is my clinical judgment that the patient will require inpatient hospital care spanning beyond 2 midnights from the point of admission due to high intensity of service, high risk for further deterioration and high frequency of surveillance required.*  Time spent in minutes : [place]

## 2020-07-16 NOTE — ED Provider Notes (Signed)
Patient signed out to me by Dr. Roderic Palau.  Patient seen with low-grade fever, shortness of breath.  Patient has COPD and severe pulmonary fibrosis.  She is normally maintained at home on 6 L of oxygen continuously.  She has shortness of breath and increased work of breathing with the increased oxygen demand.  She is currently being maintained on 12 L of oxygen.  X-ray showed bilateral infiltrates which was concerning for COVID.  COVID swab, however, is negative.  She does have a leukocytosis, tachycardia, tachypnea.  She was treated as possible sepsis initially with broad-spectrum antibiotics.    Patient had a recent fall, complaining of right-sided pain.  She did have CT of chest abdomen pelvis which did not show any acute abnormality.  Will require hospitalization for further management.   Orpah Greek, MD 07/16/20 [phone]

## 2020-07-16 NOTE — Progress Notes (Signed)
Patient admitted to the hospital earlier this morning by Dr. Clearence Ped  Patient seen and examined. She appears to be uncomfortable in pain. Reports pain in her chest, across her chest that is worse with deep breathing. She also feels as though her chest is tight and she is having difficulty breathing. She does not have a cough at this time. Lungs show fine crackles bilaterally, no LE edema  A/P:  Acute on chronic respiratory failure with hypoxia -chronically on 6L of oxygen at home -She was noted to be 84% oxygen saturations on 12L -on admission, oxygen had to be increased to 15L -Blood gas showed chronic CO2 retention, likely near baseline -CTA chest did not show any evidence of large PE, down to segmental vessels -suspect that this is being driven by her underlying pulmonary fibrosis -start on IV steroids, neb treatments, inhaled steroids -continue opiates for dyspnea -with advanced lung disease, will request palliative assistance for further addressing goals of care and symptom management of pain and dyspnea -continue to wean down oxygen as tolerated  Chest pain - Troponin negative -no obvious PE on CTA -possibly related to underlying lung disease -she also recently had a fall and may have some muscular pain on her ribs -treat supportively with pain meds  Possible community acquired pneumonia -started on merrem -will consider discontinue antibiotics in next 24 hours if no fever or cough  Fall -will need physical therapy evaluation  Chronic pain syndrome -on chronic MS contin and oral dilaudid for breakthrough pain -she is also on zanaflex and gabapentin  HLD -continue statin  Hypothyroidism -continue on synthroid  [employer]

## 2020-07-16 NOTE — Evaluation (Signed)
Physical Therapy Evaluation Patient Details Name: [name] Fitzpatrick MRN: [id number] DOB: [birth year] Today's Date: 07/16/2020   History of Present Illness  [name] Fitzpatrick  is a 65 y.o. female, with history of thyroiditis status post thyroidectomy, seizure disorder, hypertension, pulmonary fibrosis-end-stage, diabetes mellitus type 2, COPD, and more presents the ED with a chief complaint of dyspnea and chest pain.  Patient reports it was acute in onset on the day of presentation.  She reports that happened when she was just getting up off the couch.  She has had intermittent squeezing and pressure type chest pain that radiates into her left neck.  She reports that it is exertional, but not completely relieved at rest.  She reports a productive cough with thick sputum.  She has chest pain, rib pain, abdominal pain when she coughs.  She reports an intermittent fever and chills.  She has had a decreased appetite and reports that she has not eaten much in 2 weeks.  She has nausea which she takes Phenergan for at home.  She denies any dysuria, but admits to decreased urine output.  She did have a fall with a bruise on her right posterior ribs, and reports that this pain is worse because she has been breathing so hard.  Patient reports that she is too distressed to participate in the medical interview further.   Clinical Impression   Patient exhibits generalized weakness and pronounced SOB and limited functional activity tolerance at this time.  As a result of deficits presents with difficulty in walking, unsteadiness on her feet, balance deficits, and requiring frequent therapeutic rest breaks during transitional movements and mobility. Patient would benefit from skilled PT services whilst hospitalized to improve strength and functional activity tolerance to facilitate safe transition to home with family support and f/u by home health services to restore capabilities to PLOF.     Follow Up Recommendations  Home health PT    Equipment Recommendations  None recommended by PT (pt reports she has requisite DME at home)    Recommendations for Other Services OT consult     Precautions / Restrictions Restrictions Weight Bearing Restrictions: No      Mobility  Bed Mobility Overal bed mobility: Independent               Patient Response: Anxious  Transfers Overall transfer level: Needs assistance Equipment used: Rolling walker (2 wheeled) Transfers: Sit to/from [employer] Transfers Sit to Stand: Min guard Stand pivot transfers: Min guard       General transfer comment: unsteadiness due to exertion/fatigue/weakness  Ambulation/Gait Ambulation/Gait assistance: Min assist Gait Distance (Feet): 5 Feet Assistive device: Rolling walker (2 wheeled) Gait Pattern/deviations: Step-to pattern Gait velocity: decreased,   General Gait Details: unsteady  Stairs            Wheelchair Mobility    Modified Rankin (Stroke Patients Only)       Balance Overall balance assessment: Needs assistance Sitting-balance support: Single extremity supported Sitting balance-Leahy Scale: Good     Standing balance support: Bilateral upper extremity supported Standing balance-Leahy Scale: Fair                               Pertinent Vitals/Pain Pain Assessment: No/denies pain    Home Living Family/patient expects to be discharged to:: Private residence Living Arrangements: Spouse/significant other Available Help at Discharge: Family;Available PRN/intermittently Type of Home: House Home Access: Stairs to enter Entrance Stairs-Rails: None Entrance [employer]  of Steps: 1 Home Layout: One level Home Equipment: Clinical cytogeneticist - 2 wheels;Cane - single point;Walker - 4 wheels      Prior Function Level of Independence: Independent with assistive device(s)         Comments: on 2L O2 at home     Hand Dominance        Extremity/Trunk Assessment    Upper Extremity Assessment Upper Extremity Assessment: Defer to OT evaluation    Lower Extremity Assessment Lower Extremity Assessment: Generalized weakness       Communication   Communication: No difficulties  Cognition Arousal/Alertness: Awake/alert Behavior During Therapy: Anxious Overall Cognitive Status: Within Functional Limits for tasks assessed                                        General Comments      Exercises General Exercises - Lower Extremity Ankle Circles/Pumps: AROM;Both;20 reps Quad Sets: Strengthening;Both;20 reps Long Arc Quad: AROM;Both;20 reps Heel Slides: AROM;Both;20 reps   Assessment/Plan    PT Assessment Patient needs continued PT services  PT Problem List Decreased strength;Decreased activity tolerance;Decreased balance;Decreased mobility;Cardiopulmonary status limiting activity       PT Treatment Interventions DME instruction;Gait training;Stair training;Functional mobility training;Therapeutic activities;Patient/family education;Balance training;Therapeutic exercise;Wheelchair mobility training;Manual techniques    PT Goals (Current goals can be found in the Care Plan section)  Acute Rehab PT Goals Patient Stated Goal: "Get back home" PT Goal Formulation: With patient Time For Goal Achievement: 07/23/20 Potential to Achieve Goals: Good    Frequency Min 3X/week   Barriers to discharge   level of assistance needed at this time    Co-evaluation               AM-PAC PT "6 Clicks" Mobility  Outcome Measure Help needed turning from your back to your side while in a flat bed without using bedrails?: None Help needed moving from lying on your back to sitting on the side of a flat bed without using bedrails?: None Help needed moving to and from a bed to a chair (including a wheelchair)?: A Little Help needed standing up from a chair using your arms (e.g., wheelchair or bedside chair)?: A Little Help needed to walk in  hospital room?: A Lot Help needed climbing 3-5 steps with a railing? : A Lot 6 Click Score: 18    End of Session Equipment Utilized During Treatment: Gait belt Activity Tolerance: Patient limited by fatigue Patient left: in bed;with call bell/phone within reach Nurse Communication: Mobility status PT Visit Diagnosis: Unsteadiness on feet (R26.81);Other abnormalities of gait and mobility (R26.89);Muscle weakness (generalized) (M62.81)    Time: 1130-1200 PT Time Calculation (min) (ACUTE ONLY): 30 min   Charges:   PT Evaluation $PT Eval Moderate Complexity: 1 Mod PT Treatments $Therapeutic Exercise: 8-22 mins $Therapeutic Activity: 8-22 mins       12:12 PM, 07/16/20 M. Sherlyn Lees, PT, DPT Physical Therapist- [place] Office Number: [phone]

## 2020-07-17 DIAGNOSIS — W19XXXA Unspecified fall, initial encounter: Secondary | ICD-10-CM

## 2020-07-17 DIAGNOSIS — J8410 Pulmonary fibrosis, unspecified: Secondary | ICD-10-CM | POA: Diagnosis not present

## 2020-07-17 DIAGNOSIS — Y92009 Unspecified place in unspecified non-institutional (private) residence as the place of occurrence of the external cause: Secondary | ICD-10-CM

## 2020-07-17 DIAGNOSIS — J189 Pneumonia, unspecified organism: Secondary | ICD-10-CM | POA: Diagnosis not present

## 2020-07-17 DIAGNOSIS — J9621 Acute and chronic respiratory failure with hypoxia: Secondary | ICD-10-CM | POA: Diagnosis not present

## 2020-07-17 LAB — COMPREHENSIVE METABOLIC PANEL
ALT: 12 U/L (ref 0–44)
AST: 15 U/L (ref 15–41)
Albumin: 2.8 g/dL — ABNORMAL LOW (ref 3.5–5.0)
Alkaline Phosphatase: 57 U/L (ref 38–126)
Anion gap: 9 (ref 5–15)
BUN: 11 mg/dL (ref 8–23)
CO2: 26 mmol/L (ref 22–32)
Calcium: 8.4 mg/dL — ABNORMAL LOW (ref 8.9–10.3)
Chloride: 100 mmol/L (ref 98–111)
Creatinine, Ser: 0.44 mg/dL (ref 0.44–1.00)
GFR, Estimated: >60 mL/min (ref >60)
Glucose, Bld: 122 mg/dL — ABNORMAL HIGH (ref 70–99)
Potassium: 4.3 mmol/L (ref 3.5–5.1)
Sodium: 135 mmol/L (ref 135–145)
Total Bilirubin: 0.6 mg/dL (ref 0.3–1.2)
Total Protein: 6.3 g/dL — ABNORMAL LOW (ref 6.5–8.1)

## 2020-07-17 LAB — CBC
HCT: 30.3 % — ABNORMAL LOW (ref 36.0–46.0)
Hemoglobin: 9.3 g/dL — ABNORMAL LOW (ref 12.0–15.0)
MCH: 28.4 pg (ref 26.0–34.0)
MCHC: 30.7 g/dL (ref 30.0–36.0)
MCV: 92.7 fL (ref 80.0–100.0)
Platelets: 272 10*3/uL (ref 150–400)
RBC: 3.27 MIL/uL — ABNORMAL LOW (ref 3.87–5.11)
RDW: 14.1 % (ref 11.5–15.5)
WBC: 9.5 10*3/uL (ref 4.0–10.5)
nRBC: 0 % (ref 0.0–0.2)

## 2020-07-17 LAB — URINE CULTURE: Culture: <10000 — AB

## 2020-07-17 LAB — MRSA PCR SCREENING: MRSA by PCR: NEGATIVE

## 2020-07-17 LAB — MAGNESIUM: Magnesium: 2.1 mg/dL (ref 1.7–2.4)

## 2020-07-17 MED ORDER — ALPRAZOLAM 0.5 MG PO TABS
0.5000 mg | ORAL_TABLET | Freq: Three times a day (TID) | ORAL | Status: DC | PRN
  Administered 2020-07-18: 0.5 mg via ORAL
  Filled 2020-07-17: qty 1

## 2020-07-17 MED ORDER — HYDROMORPHONE HCL 1 MG/ML IJ SOLN
1.0000 mg | INTRAMUSCULAR | Status: DC | PRN
  Administered 2020-07-17 – 2020-07-19 (×11): 1 mg via INTRAVENOUS
  Filled 2020-07-17 (×11): qty 1

## 2020-07-17 NOTE — Progress Notes (Signed)
PROGRESS NOTE    [name] Fitzpatrick  ZOX:[id number] DOB: 1956-01-22 DOA: 07/15/2020 PCP: Lilian Coma., MD    Brief Narrative:  65 y/o female with end stage pulmonary fibrosis, chronic resp failure on 5-6L of oxygen, COPD, admitted with worsening shortness of breath and hypoxia, felt to be related to pulmonary fibrosis. She is on steroids and bronchodilators without significant improvement. Palliative care consulted for goals of care. I feel she would be appropriate for hospice   Assessment & Plan:   Active Problems:   Fall at home, initial encounter   Acute on chronic respiratory failure with hypoxia ([place])   Community acquired pneumonia   Tachycardia   Acute on chronic respiratory failure with hypoxia -chronically on 5-6L of oxygen at home -She was noted to be 84% oxygen saturations on 12L -on admission, oxygen had to be increased to 15L -Blood gas showed chronic CO2 retention, likely near baseline -CTA chest did not show any evidence of large PE, down to segmental vessels -suspect that this is being driven by her underlying advanced pulmonary fibrosis -she is currently on IV steroids, neb treatments, inhaled steroids -she is also on IV antibiotics for possible pneumonia -despite these treatments, patient appears to be really struggling with her breathing -palliative care consulted to help further address goals of care and symptom management -will use IV dilaudid prn for air hunger  Chest pain - Troponin negative -no obvious PE on CTA -possibly related to underlying lung disease -she also recently had a fall and may have some muscular pain on her ribs -treat supportively with pain meds  Possible community acquired pneumonia -currently on merrem -since patient has not had any fever, cough or other signs of infection, pneumonia appears to be less likely in this scenario -will discontinue further antibiotics and observe  Fall -will need physical therapy  evaluation  Chronic pain syndrome -on chronic MS contin and oral dilaudid for breakthrough pain -she is also on zanaflex and gabapentin  HLD -continue statin  Hypothyroidism -continue on synthroid  Goals of care -Patient is currently DNR -began discussion around goals of care with the patient -she reports that she has had one visit by a palliative care RN at home and is supposed to have a follow up in 2 weeks -I mentioned to the patient that considering her advanced lung disease, I think she would be an appropriate candidate for hospice -we discussed focusing her care towards symptom management and quality of life -patient agrees with this approach -I also discussed this approach with patient's husband and he feels this is reasonable -I think it would be helpful for patient and husband to meet with palliative care to further define goals of care and to help adjust symptom management   DVT prophylaxis: heparin injection 5,000 Units Start: 07/16/20 0900 SCDs Start: 07/16/20 0801  Code Status: DNR Family Communication: dicussed with husband over the phone 5/1 Disposition Plan: Status is: Inpatient  Remains inpatient appropriate because:Inpatient level of care appropriate due to severity of illness   Dispo: The patient is from: Home              Anticipated d/c is to: TBD, possibly residential hospice              Patient currently is not medically stable to d/c.   Difficult to place patient No    Consultants:     Procedures:     Antimicrobials:   merrem 4/30>    Subjective: Patient had a difficult time  overnight with her breathing. She continues to feel short of breath and describes feeling as though there is a vice around her chest  Objective: Vitals:   07/17/20 0900 07/17/20 0930 07/17/20 1000 07/17/20 1100  BP: (!) 99/46  (!) 106/53 (!) 112/47  Pulse: (!) 50 63 (!) 44 (!) 41  Resp: 17 (!) 22 18 16   Temp:      TempSrc:      SpO2: 100% 100% 100% 100%   Weight:      Height:        Intake/Output Summary (Last 24 hours) at 07/17/2020 1139 Last data filed at 07/17/2020 1105 Gross per 24 hour  Intake 990 ml  Output 800 ml  Net 190 ml   Filed Weights   07/15/20 2021 07/16/20 0801 07/17/20 0417  Weight: 71.5 kg 72.6 kg 72.9 kg    Examination:  General exam: laying in bed, appears uncomfortable Respiratory system: fine crackles bilaterally. Respiratory effort increased Cardiovascular system: S1 & S2 heard, RRR. No JVD, murmurs, rubs, gallops or clicks. No pedal edema. Gastrointestinal system: Abdomen is nondistended, soft and nontender. No organomegaly or masses felt. Normal bowel sounds heard. Central nervous system: Alert and oriented. No focal neurological deficits. Extremities: Symmetric 5 x 5 power. Skin: No rashes, lesions or ulcers Psychiatry: Judgement and insight appear normal. Mood & affect appropriate.     Data Reviewed: I have personally reviewed following labs and imaging studies  CBC: Recent Labs  Lab 07/15/20 2033 07/17/20 0402  WBC 17.1* 9.5  NEUTROABS 14.6*  --   HGB 11.5* 9.3*  HCT 37.9 30.3*  MCV 91.3 92.7  PLT 441* 956   Basic Metabolic Panel: Recent Labs  Lab 07/15/20 2033 07/17/20 0402  NA 134* 135  K 3.7 4.3  CL 98 100  CO2 26 26  GLUCOSE 123* 122*  BUN 12 11  CREATININE 0.65 0.44  CALCIUM 9.1 8.4*  MG  --  2.1   GFR: Estimated Creatinine Clearance: 66.4 mL/min (by C-G formula based on SCr of 0.44 mg/dL). Liver Function Tests: Recent Labs  Lab 07/15/20 2033 07/17/20 0402  AST 15 15  ALT 10 12  ALKPHOS 73 57  BILITOT 0.6 0.6  PROT 7.8 6.3*  ALBUMIN 3.6 2.8*   No results for input(s): LIPASE, AMYLASE in the last 168 hours. No results for input(s): AMMONIA in the last 168 hours. Coagulation Profile: Recent Labs  Lab 07/15/20 2145  INR 1.0   Cardiac Enzymes: No results for input(s): CKTOTAL, CKMB, CKMBINDEX, TROPONINI in the last 168 hours. BNP (last 3 results) No results  for input(s): PROBNP in the last 8760 hours. HbA1C: No results for input(s): HGBA1C in the last 72 hours. CBG: No results for input(s): GLUCAP in the last 168 hours. Lipid Profile: No results for input(s): CHOL, HDL, LDLCALC, TRIG, CHOLHDL, LDLDIRECT in the last 72 hours. Thyroid Function Tests: No results for input(s): TSH, T4TOTAL, FREET4, T3FREE, THYROIDAB in the last 72 hours. Anemia Panel: No results for input(s): VITAMINB12, FOLATE, FERRITIN, TIBC, IRON, RETICCTPCT in the last 72 hours. Sepsis Labs: Recent Labs  Lab 07/15/20 2145 07/15/20 2350  LATICACIDVEN 0.7 0.9    Recent Results (from the past 240 hour(s))  Urine culture     Status: Abnormal   Collection Time: 07/15/20  3:57 AM   Specimen: Urine, Clean Catch  Result Value Ref Range Status   Specimen Description   Final    URINE, CLEAN CATCH Performed at [facility], [address], [place], [place]  [address]    Special Requests   Final    NONE Performed at [facility], [address], [place], [place] [address]    Culture (A)  Final    <10,000 COLONIES/mL INSIGNIFICANT GROWTH Performed at [facility] [address], [place], [place] [address]    Report Status 07/17/2020 FINAL  Final  Blood culture (routine single)     Status: None (Preliminary result)   Collection Time: 07/15/20  9:44 PM   Specimen: BLOOD LEFT HAND  Result Value Ref Range Status   Specimen Description BLOOD LEFT HAND  Final   Special Requests   Final    Blood Culture results may not be optimal due to an excessive volume of blood received in culture bottles BOTTLES DRAWN AEROBIC AND ANAEROBIC   Culture   Final    NO GROWTH 2 DAYS Performed at [facility], [address] [place], [place] [address]    Report Status PENDING  Incomplete  SARS CORONAVIRUS 2 (TAT 6-24 HRS) Nasopharyngeal Nasopharyngeal Swab     Status: None   Collection Time: 07/15/20  9:50 PM   Specimen: Nasopharyngeal Swab  Result Value Ref Range Status   SARS  Coronavirus 2 NEGATIVE NEGATIVE Final    Comment: (NOTE) SARS-CoV-2 target nucleic acids are NOT DETECTED.  The SARS-CoV-2 RNA is generally detectable in upper and lower respiratory specimens during the acute phase of infection. Negative results do not preclude SARS-CoV-2 infection, do not rule out co-infections with other pathogens, and should not be used as the sole basis for treatment or other patient management decisions. Negative results must be combined with clinical observations, patient history, and epidemiological information. The expected result is Negative.  Fact Sheet for Patients: SugarRoll.be  Fact Sheet for Healthcare Providers: https://www.woods-mathews.com/  This test is not yet approved or cleared by the [place] FDA and  has been authorized for detection and/or diagnosis of SARS-CoV-2 by FDA under an Emergency Use Authorization (EUA). This EUA will remain  in effect (meaning this test can be used) for the duration of the COVID-19 declaration under Se ction 564(b)(1) of the Act, 21 U.S.C. section 360bbb-3(b)(1), unless the authorization is terminated or revoked sooner.  Performed at [facility], [place] [address], [place],  [address]   Resp Panel by RT-PCR (Flu A&B, Covid) Nasopharyngeal Swab     Status: None   Collection Time: 07/16/20  1:48 AM   Specimen: Nasopharyngeal Swab; Nasopharyngeal(NP) swabs in vial transport medium  Result Value Ref Range Status   SARS Coronavirus 2 by RT PCR NEGATIVE NEGATIVE Final    Comment: (NOTE) SARS-CoV-2 target nucleic acids are NOT DETECTED.  The SARS-CoV-2 RNA is generally detectable in upper respiratory specimens during the acute phase of infection. The lowest concentration of SARS-CoV-2 viral copies this assay can detect is 138 copies/mL. A negative result does not preclude SARS-Cov-2 infection and should not be used as the sole basis for treatment or other  patient management decisions. A negative result may occur with  improper specimen collection/handling, submission of specimen other than nasopharyngeal swab, presence of viral mutation(s) within the areas targeted by this assay, and inadequate number of viral copies(<138 copies/mL). A negative result must be combined with clinical observations, patient history, and epidemiological information. The expected result is Negative.  Fact Sheet for Patients:  EntrepreneurPulse.com.au  Fact Sheet for Healthcare Providers:  IncredibleEmployment.be  This test is no t yet approved or cleared by the [place] and  has been authorized  for detection and/or diagnosis of SARS-CoV-2 by FDA under an Emergency Use Authorization (EUA). This EUA will remain  in effect (meaning this test can be used) for the duration of the COVID-19 declaration under Section 564(b)(1) of the Act, 21 U.S.C.section 360bbb-3(b)(1), unless the authorization is terminated  or revoked sooner.       Influenza A by PCR NEGATIVE NEGATIVE Final   Influenza B by PCR NEGATIVE NEGATIVE Final    Comment: (NOTE) The Xpert Xpress SARS-CoV-2/FLU/RSV plus assay is intended as an aid in the diagnosis of influenza from Nasopharyngeal swab specimens and should not be used as a sole basis for treatment. Nasal washings and aspirates are unacceptable for Xpert Xpress SARS-CoV-2/FLU/RSV testing.  Fact Sheet for Patients: BloggerCourse.com  Fact Sheet for Healthcare Providers: SeriousBroker.it  This test is not yet approved or cleared by the [place] FDA and has been authorized for detection and/or diagnosis of SARS-CoV-2 by FDA under an Emergency Use Authorization (EUA). This EUA will remain in effect (meaning this test can be used) for the duration of the COVID-19 declaration under Section 564(b)(1) of the Act, 21 U.S.C. section  360bbb-3(b)(1), unless the authorization is terminated or revoked.  Performed at [facility], [address], [place], [place] [address]   MRSA PCR Screening     Status: None   Collection Time: 07/16/20  9:24 AM   Specimen: Nasal Mucosa; Nasopharyngeal  Result Value Ref Range Status   MRSA by PCR NEGATIVE NEGATIVE Final    Comment:        The GeneXpert MRSA Assay (FDA approved for NASAL specimens only), is one component of a comprehensive MRSA colonization surveillance program. It is not intended to diagnose MRSA infection nor to guide or monitor treatment for MRSA infections. Performed at [facility], [address] [place], [place] [address]          Radiology Studies: [facility] Chest [facility] 1 View  Result Date: 07/15/2020 CLINICAL DATA:  Shortness of breath. EXAM: PORTABLE CHEST 1 VIEW COMPARISON:  CT chest 05/25/2020, chest x-ray 05/25/2020 FINDINGS: The heart size and mediastinal contours are unchanged. Enlarged pulmonary artery. Coarsened interstitial markings with patchy airspace opacities. No pleural effusion. No pneumothorax. No acute osseous abnormality. IMPRESSION: 1. Chronic interstitial disease with no superimposed diffuse patchy airspace opacities. Findings could represent infection/inflammation. COVID-19 infection not excluded. Followup PA and lateral chest X-ray is recommended in 3-4 weeks following therapy to ensure resolution and exclude underlying malignancy. 2. Enlarged pulmonary artery consistent with pulmonary hypertension. Electronically Signed   By: Tish Frederickson M.D.   On: 07/15/2020 21:46   CT Angio Chest/Abd/Pel for Dissection W and/or Wo Contrast  Result Date: 07/16/2020 CLINICAL DATA:  Chest pain radiating to the right-side. EXAM: CT ANGIOGRAPHY CHEST, ABDOMEN AND PELVIS TECHNIQUE: Non-contrast CT of the chest was initially obtained. Multidetector CT imaging through the chest, abdomen and pelvis was performed using the standard protocol during bolus  administration of intravenous contrast. Multiplanar reconstructed images and MIPs were obtained and reviewed to evaluate the vascular anatomy. CONTRAST:  OMNIPAQUE IOHEXOL 350 MG/ML SOLN COMPARISON:  May 25, 2020 FINDINGS: CTA CHEST FINDINGS Cardiovascular: There is mild to moderate severity calcification of the aortic arch, without evidence of aortic aneurysm or dissection. Satisfactory opacification of the pulmonary arteries to the segmental level. No evidence of pulmonary embolism. Normal heart size. No pericardial effusion. Mediastinum/Nodes: A 2.4 cm x 1.5 cm AP window lymph node is seen. This is increased in size when compared to the prior study. 1.9  cm x 1.8 cm and 2.4 cm x 2.2 cm right hilar lymph nodes are also noted. These are also increased in size. Stable left hilar lymphadenopathy is present. Thyroid gland, trachea, and esophagus demonstrate no significant findings. Lungs/Pleura: Marked severity diffuse bilateral infiltrates are seen. This is increased in severity when compared to the prior study. Diffuse, chronic interstitial lung disease is also noted, bilaterally with persistent areas of bilateral bronchiectasis. There is no evidence of a pleural effusion or pneumothorax. Musculoskeletal: Multilevel degenerative changes seen throughout the thoracic spine. Review of the MIP images confirms the above findings. CTA ABDOMEN AND PELVIS FINDINGS VASCULAR Aorta: Normal caliber aorta without aneurysm, dissection, vasculitis or significant stenosis. Celiac: Patent without evidence of aneurysm, dissection, vasculitis or significant stenosis. SMA: Patent without evidence of aneurysm, dissection, vasculitis or significant stenosis. Renals: Both renal arteries are patent without evidence of aneurysm, dissection, vasculitis, fibromuscular dysplasia or significant stenosis. IMA: Patent without evidence of aneurysm, dissection, vasculitis or significant stenosis. Inflow: Patent without evidence of aneurysm,  dissection, vasculitis or significant stenosis. Veins: An inferior vena cava filter is in place. Review of the MIP images confirms the above findings. NON-VASCULAR Hepatobiliary: No focal liver abnormality is seen. Status post cholecystectomy. No biliary dilatation. Pancreas: Unremarkable. No pancreatic ductal dilatation or surrounding inflammatory changes. Spleen: Subcentimeter calcified granulomas are seen within the spleen. Adrenals/Urinary Tract: Adrenal glands are unremarkable. Kidneys are normal, without renal calculi, focal lesion, or hydronephrosis. Bladder is unremarkable. Stomach/Bowel: Stomach is within normal limits. The appendix is not clearly identified. No evidence of bowel wall thickening, distention, or inflammatory changes. Lymphatic: No abnormal abdominal or pelvic lymph nodes are identified. Reproductive: Status post hysterectomy. No adnexal masses. Other: No abdominal wall hernia or abnormality. No abdominopelvic ascites. Musculoskeletal: There is evidence of prior vertebroplasty at the level of L1. Bilateral pedicle screws are seen at the levels of L4, L5 and S1 with operative material also seen within the intervertebral disc spaces at these levels. Review of the MIP images confirms the above findings. IMPRESSION: 1. No evidence of aneurysmal dilatation or dissection of the thoracic or abdominal aorta. 2. Diffuse chronic interstitial lung disease with worsening, superimposed marked severity diffuse bilateral infiltrates. 3. Worsening mediastinal and right hilar lymphadenopathy. 4. Evidence of prior cholecystectomy and hysterectomy. 5. Postoperative changes within the lower lumbar spine. Electronically Signed   By: Virgina Norfolk M.D.   On: 07/16/2020 00:59        Scheduled Meds: . aspirin EC  81 mg Oral Daily  . benzonatate  200 mg Oral TID  . budesonide (PULMICORT) nebulizer solution  0.25 mg Nebulization BID  . Chlorhexidine Gluconate Cloth  6 each Topical Daily  . fluticasone   2 spray Each Nare Daily  . gabapentin  100 mg Oral TID  . heparin  5,000 Units Subcutaneous Q8H  . ipratropium-albuterol  3 mL Nebulization Q6H  . levothyroxine  175 mcg Oral Daily  . methylPREDNISolone (SOLU-MEDROL) injection  60 mg Intravenous Q12H  . morphine  15 mg Oral Q12H  . pantoprazole  40 mg Oral BID  . sertraline  100 mg Oral Daily  . simvastatin  20 mg Oral QHS  . tiZANidine  8 mg Oral TID   Continuous Infusions: . meropenem (MERREM) IV Stopped (07/17/20 0622)  . promethazine (PHENERGAN) injection (IM or IVPB) Stopped (07/17/20 0955)     LOS: 1 day    Time spent: 74mins    Kathie Dike, MD [facility]   If 7PM-7AM, please contact night-coverage [email or web address]  07/17/2020, 11:39 AM

## 2020-07-17 NOTE — Progress Notes (Signed)
RN asked for me to give patient a PRN.   Patient had just received her regular treatment after 1am.  Walked into room and patient was air hungry and complaining of pain in her chest.  Gave patient PRN Albuterol treatment and RN gave patient all that she could have.  I suggested RN to order patient a fan to blow in her face to see if that would help patient's anxiety.  Patient is currently on 11L [place] with a sat in upper 90s, but patient is still anxious when she awakens. Patient was making statements that she "wanted the Lord to come get her", and that she did not want to be in any pain.   Will continue to monitor patient.

## 2020-07-17 NOTE — Progress Notes (Signed)
Patient called out stating she was having trouble breathing.  At the time her DBP was in the 30s, MAP was in the 50s, oxygen saturation was 97% and RR 22.  Patient restless and appears uncomfortable.  I had previously given her IV dilaudid for it.  She stated she also wanted a treatment.  Respiratory notified.  Ativan PO given.  Patient made multiple comments about how she "just wanted to die because she was tried of suffering".  Advised patient to speak with her family and attending about her wishes.

## 2020-07-18 ENCOUNTER — Encounter (HOSPITAL_COMMUNITY): Payer: Self-pay | Admitting: Family Medicine

## 2020-07-18 DIAGNOSIS — J9621 Acute and chronic respiratory failure with hypoxia: Secondary | ICD-10-CM

## 2020-07-18 DIAGNOSIS — Z7189 Other specified counseling: Secondary | ICD-10-CM | POA: Diagnosis not present

## 2020-07-18 DIAGNOSIS — J8410 Pulmonary fibrosis, unspecified: Secondary | ICD-10-CM | POA: Diagnosis not present

## 2020-07-18 DIAGNOSIS — Z515 Encounter for palliative care: Secondary | ICD-10-CM | POA: Diagnosis not present

## 2020-07-18 MED ORDER — ALPRAZOLAM 0.5 MG PO TABS
0.5000 mg | ORAL_TABLET | Freq: Three times a day (TID) | ORAL | Status: DC
  Administered 2020-07-18 – 2020-07-19 (×4): 0.5 mg via ORAL
  Filled 2020-07-18 (×4): qty 1

## 2020-07-18 MED ORDER — ONDANSETRON HCL 4 MG PO TABS: 4.0000 mg | ORAL_TABLET | Freq: Three times a day (TID) | ORAL | Status: DC | PRN

## 2020-07-18 MED ORDER — METHOCARBAMOL 500 MG PO TABS
500.0000 mg | ORAL_TABLET | Freq: Three times a day (TID) | ORAL | Status: DC
  Administered 2020-07-18: 500 mg via ORAL
  Filled 2020-07-18: qty 1

## 2020-07-18 MED ORDER — OXYCODONE HCL ER 10 MG PO T12A
10.0000 mg | EXTENDED_RELEASE_TABLET | Freq: Two times a day (BID) | ORAL | Status: DC
  Administered 2020-07-18: 10 mg via ORAL
  Filled 2020-07-18: qty 1

## 2020-07-18 MED ORDER — SODIUM CHLORIDE 0.9 % IV SOLN
12.5000 mg | Freq: Four times a day (QID) | INTRAVENOUS | Status: DC | PRN
  Administered 2020-07-18: 12.5 mg via INTRAVENOUS
  Filled 2020-07-18: qty 0.5

## 2020-07-18 MED ORDER — PROMETHAZINE HCL 25 MG/ML IJ SOLN
INTRAMUSCULAR | Status: AC
  Filled 2020-07-18: qty 1

## 2020-07-18 MED ORDER — GABAPENTIN 300 MG PO CAPS
300.0000 mg | ORAL_CAPSULE | Freq: Three times a day (TID) | ORAL | Status: DC
  Administered 2020-07-18 – 2020-07-19 (×4): 300 mg via ORAL
  Filled 2020-07-18 (×3): qty 1

## 2020-07-18 MED ORDER — SODIUM CHLORIDE 0.9 % IV SOLN
12.5000 mg | Freq: Four times a day (QID) | INTRAVENOUS | Status: DC | PRN
  Administered 2020-07-19 (×2): 12.5 mg via INTRAVENOUS
  Filled 2020-07-18 (×2): qty 0.5

## 2020-07-18 NOTE — Progress Notes (Signed)
PROGRESS NOTE    [name] Fitzpatrick  KJZ:[id number] DOB: June 14, 1955 DOA: 07/15/2020 PCP: Lilian Coma., MD    Brief Narrative:  65 y/o female with end stage pulmonary fibrosis, chronic resp failure on 5-6L of oxygen, COPD, admitted with worsening shortness of breath and hypoxia, felt to be related to pulmonary fibrosis. She is on steroids and bronchodilators without significant improvement. Palliative care consulted for goals of care. I feel she would be appropriate for hospice  Subjective: The patient was seen and examined this morning, nursing staff present at bedside She is awake alert, still on 11 L of oxygen, satting 91% this morning Complained of generalized aches and pain -stating current pain medication not helping    Assessment & Plan:   Active Problems:   COPD (chronic obstructive pulmonary disease) ([place])   Fall at home, initial encounter   Acute on chronic respiratory failure with hypoxia ([facility])   Seizure disorder ([place])   Community acquired pneumonia   Tachycardia   Pulmonary fibrosis ([place])   Acute on chronic respiratory failure with hypoxia -chronically on 5-6L of oxygen at home -She was noted to be 84% oxygen saturations on 12L, on admission required 15 L This morning patient remains on 11 L of oxygen, satting 91%.....   -Blood gas showed chronic CO2 retention, likely near baseline -CTA chest did not show any evidence of large PE, down to segmental vessels -suspect that this is being driven by her underlying advanced pulmonary fibrosis -she is currently on IV steroids, neb treatments, inhaled steroids -she is also on IV antibiotics for possible pneumonia -despite these treatments, patient appears to be really struggling with her breathing -palliative care consulted: Has met with the patient and family, agreed for continued palliative care follow-up, DNR/DNI status was established, goal of care to possibly go home with oxygen, may pursue hospice as an  outpatient -will use IV dilaudid prn for air hunger, muscle relaxants scheduled pain medication Continuing MS Contin 12 mg every 12 hours, Zanaflex added  Chest pain -Denies any chest pain this morning, troponin negative -no obvious PE on CTA -possibly related to underlying lung disease -she also recently had a fall and may have some muscular pain on her ribs -treat supportively with pain meds  Possible community acquired pneumonia -Was on IV antibiotics -Patient remained afebrile normotensive improved cough, previous water thought it was less likely pneumonia or sepsis subsequently antibiotics were discontinued -Will not initiate any further antibiotics  Fall/severe debility -will need physical therapy evaluation -Assist with all ADLs  Chronic pain syndrome -on chronic MS contin and oral dilaudid for breakthrough pain -she is also on zanaflex and gabapentin  HLD -We will discontinue statins  Hypothyroidism -continue on synthroid  Goals of care -Active care consulted -Of care has been discussed in detail we will proceed with palliative care, likely home with hospice follow-up as an outpatient -Will arrange with higher oxygen demand social worker currently assisting to arrange it  -Confirmed DNR/DNI status  -I mentioned to the patient that considering her advanced lung disease, I think she would be an appropriate candidate for hospice -Patient's family is on board with goals of care ...     DVT prophylaxis: heparin injection 5,000 Units Start: 07/16/20 0900 SCDs Start: 07/16/20 0801  Code Status: DNR Family Communication: dicussed with husband over the phone 5/1 Disposition Plan: Status is: Inpatient  Remains inpatient appropriate because:Inpatient level of care appropriate due to severity of illness   Dispo: The patient is from: Home  Anticipated d/c is to: Likely home with home palliative follow-up hospice at home     Dissipating discharge in  a.m., arranging high flow oxygen              Patient currently is not medically stable to d/c.   Difficult to place patient No    Consultants:     Procedures:     Antimicrobials:   merrem 4/30> 07/17/2020    Objective: Vitals:   07/18/20 0900 07/18/20 0957 07/18/20 1000 07/18/20 1110  BP: (!) 112/47  (!) 103/52   Pulse: 67 87 84 64  Resp: (!) _0 Temp:    98.7 F (37.1 C)  TempSrc:    Oral  SpO2: 94% 91% (!) 87% 96%  Weight:      Height:        Intake/Output Summary (Last 24 hours) at 07/18/2020 1335 Last data filed at 07/18/2020 1011 Gross per 24 hour  Intake 465.22 ml  Output 2025 ml  Net -1559.78 ml   Filed Weights   07/16/20 0801 07/17/20 0417 07/18/20 0600  Weight: 72.6 kg 72.9 kg 73.3 kg      Physical Exam:   General:   Awake alert, in respiratory distress, shortness of breath or limit use of oxygen  HEENT:  Normocephalic, PERRL, otherwise with in Normal limits   Neuro:  CNII-XII intact. , normal motor and sensation, reflexes intact   Lungs:    Mildly labored breathing, with increased effort, positive breath sounds  Cardio:    S1/S2, RRR, No murmure, No Rubs or Gallops   Abdomen:   Soft, non-tender, bowel sounds active all four quadrants,  no guarding or peritoneal signs.  Muscular skeletal:   Generalized weaknesses -comfortable in bed Limited exam - in bed, able to move all 4 extremities,  2+ pulses,  symmetric,   Skin:  Dry, warm to touch, negative for any Rashes,  Wounds: Please see nursing documentation          Data Reviewed: I have personally reviewed following labs and imaging studies  CBC: Recent Labs  Lab 07/15/20 2033 07/17/20 0402  WBC 17.1* 9.5  NEUTROABS 14.6*  --   HGB 11.5* 9.3*  HCT 37.9 30.3*  MCV 91.3 92.7  PLT 441* 470   Basic Metabolic Panel: Recent Labs  Lab 07/15/20 2033 07/17/20 0402  NA 134* 135  K 3.7 4.3  CL 98 100  CO2 26 26  GLUCOSE 123* 122*  BUN 12 11  CREATININE 0.65 0.44  CALCIUM 9.1  8.4*  MG  --  2.1   GFR: Estimated Creatinine Clearance: 66.6 mL/min (by C-G formula based on SCr of 0.44 mg/dL). Liver Function Tests: Recent Labs  Lab 07/15/20 2033 07/17/20 0402  AST 15 15  ALT 10 12  ALKPHOS 73 57  BILITOT 0.6 0.6  PROT 7.8 6.3*  ALBUMIN 3.6 2.8*   No results for input(s): LIPASE, AMYLASE in the last 168 hours. No results for input(s): AMMONIA in the last 168 hours. Coagulation Profile: Recent Labs  Lab 07/15/20 2145  INR 1.0   Cardiac Enzymes: No results for input(s): CKTOTAL, CKMB, CKMBINDEX, TROPONINI in the last 168 hours. BNP (last 3 results) No results for input(s): PROBNP in the last 8760 hours. HbA1C: No results for input(s): HGBA1C in the last 72 hours. CBG: No results for input(s): GLUCAP in the last 168 hours. Lipid Profile: No results for input(s): CHOL, HDL, LDLCALC, TRIG, CHOLHDL, LDLDIRECT in the last 72 hours. Thyroid  Function Tests: No results for input(s): TSH, T4TOTAL, FREET4, T3FREE, THYROIDAB in the last 72 hours. Anemia Panel: No results for input(s): VITAMINB12, FOLATE, FERRITIN, TIBC, IRON, RETICCTPCT in the last 72 hours. Sepsis Labs: Recent Labs  Lab 07/15/20 2145 07/15/20 2350  LATICACIDVEN 0.7 0.9    Recent Results (from the past 240 hour(s))  Urine culture     Status: Abnormal   Collection Time: 07/15/20  3:57 AM   Specimen: Urine, Clean Catch  Result Value Ref Range Status   Specimen Description   Final    URINE, CLEAN CATCH Performed at [facility], [address] [place], [place] [address]    Special Requests   Final    NONE Performed at [facility], [address] [place], [place] [address]    Culture (A)  Final    <10,000 COLONIES/mL INSIGNIFICANT GROWTH Performed at [facility] [address] [place], [place] [address]    Report Status 07/17/2020 FINAL  Final  Blood culture (routine single)     Status: None (Preliminary result)   Collection Time: 07/15/20  9:44 PM   Specimen: BLOOD  LEFT HAND  Result Value Ref Range Status   Specimen Description BLOOD LEFT HAND  Final   Special Requests   Final    Blood Culture results may not be optimal due to an excessive volume of blood received in culture bottles BOTTLES DRAWN AEROBIC AND ANAEROBIC   Culture   Final    NO GROWTH 3 DAYS Performed at [facility], [address] [place], [place] [address]    Report Status PENDING  Incomplete  SARS CORONAVIRUS 2 (TAT 6-24 HRS) Nasopharyngeal Nasopharyngeal Swab     Status: None   Collection Time: 07/15/20  9:50 PM   Specimen: Nasopharyngeal Swab  Result Value Ref Range Status   SARS Coronavirus 2 NEGATIVE NEGATIVE Final    Comment: (NOTE) SARS-CoV-2 target nucleic acids are NOT DETECTED.  The SARS-CoV-2 RNA is generally detectable in upper and lower respiratory specimens during the acute phase of infection. Negative results do not preclude SARS-CoV-2 infection, do not rule out co-infections with other pathogens, and should not be used as the sole basis for treatment or other patient management decisions. Negative results must be combined with clinical observations, patient history, and epidemiological information. The expected result is Negative.  Fact Sheet for Patients: SugarRoll.be  Fact Sheet for Healthcare Providers: https://www.woods-mathews.com/  This test is not yet approved or cleared by the [place] FDA and  has been authorized for detection and/or diagnosis of SARS-CoV-2 by FDA under an Emergency Use Authorization (EUA). This EUA will remain  in effect (meaning this test can be used) for the duration of the COVID-19 declaration under Se ction 564(b)(1) of the Act, 21 U.S.C. section 360bbb-3(b)(1), unless the authorization is terminated or revoked sooner.  Performed at [facility], [place] [address], [place], [place] [address]   Resp Panel by RT-PCR (Flu A&B, Covid) Nasopharyngeal Swab     Status: None    Collection Time: 07/16/20  1:48 AM   Specimen: Nasopharyngeal Swab; Nasopharyngeal(NP) swabs in vial transport medium  Result Value Ref Range Status   SARS Coronavirus 2 by RT PCR NEGATIVE NEGATIVE Final    Comment: (NOTE) SARS-CoV-2 target nucleic acids are NOT DETECTED.  The SARS-CoV-2 RNA is generally detectable in upper respiratory specimens during the acute phase of infection. The lowest concentration of SARS-CoV-2 viral copies this assay can detect is 138 copies/mL. A negative result does  not preclude SARS-Cov-2 infection and should not be used as the sole basis for treatment or other patient management decisions. A negative result may occur with  improper specimen collection/handling, submission of specimen other than nasopharyngeal swab, presence of viral mutation(s) within the areas targeted by this assay, and inadequate number of viral copies(<138 copies/mL). A negative result must be combined with clinical observations, patient history, and epidemiological information. The expected result is Negative.  Fact Sheet for Patients:  EntrepreneurPulse.com.au  Fact Sheet for Healthcare Providers:  IncredibleEmployment.be  This test is no t yet approved or cleared by the [place] FDA and  has been authorized for detection and/or diagnosis of SARS-CoV-2 by FDA under an Emergency Use Authorization (EUA). This EUA will remain  in effect (meaning this test can be used) for the duration of the COVID-19 declaration under Section 564(b)(1) of the Act, 21 U.S.C.section 360bbb-3(b)(1), unless the authorization is terminated  or revoked sooner.       Influenza A by PCR NEGATIVE NEGATIVE Final   Influenza B by PCR NEGATIVE NEGATIVE Final    Comment: (NOTE) The Xpert Xpress SARS-CoV-2/FLU/RSV plus assay is intended as an aid in the diagnosis of influenza from Nasopharyngeal swab specimens and should not be used as a sole basis for treatment.  Nasal washings and aspirates are unacceptable for Xpert Xpress SARS-CoV-2/FLU/RSV testing.  Fact Sheet for Patients: EntrepreneurPulse.com.au  Fact Sheet for Healthcare Providers: IncredibleEmployment.be  This test is not yet approved or cleared by the [place] FDA and has been authorized for detection and/or diagnosis of SARS-CoV-2 by FDA under an Emergency Use Authorization (EUA). This EUA will remain in effect (meaning this test can be used) for the duration of the COVID-19 declaration under Section 564(b)(1) of the Act, 21 U.S.C. section 360bbb-3(b)(1), unless the authorization is terminated or revoked.  Performed at [facility], [address] [place], [place] [address]   MRSA PCR Screening     Status: None   Collection Time: 07/16/20  9:24 AM   Specimen: Nasal Mucosa; Nasopharyngeal  Result Value Ref Range Status   MRSA by PCR NEGATIVE NEGATIVE Final    Comment:        The GeneXpert MRSA Assay (FDA approved for NASAL specimens only), is one component of a comprehensive MRSA colonization surveillance program. It is not intended to diagnose MRSA infection nor to guide or monitor treatment for MRSA infections. Performed at [facility], [address], [place], [place] [address]          Radiology Studies: No results found.      Scheduled Meds: . ALPRAZolam  0.5 mg Oral TID  . aspirin EC  81 mg Oral Daily  . benzonatate  200 mg Oral TID  . budesonide (PULMICORT) nebulizer solution  0.25 mg Nebulization BID  . Chlorhexidine Gluconate Cloth  6 each Topical Daily  . fluticasone  2 spray Each Nare Daily  . gabapentin  300 mg Oral TID  . heparin  5,000 Units Subcutaneous Q8H  . ipratropium-albuterol  3 mL Nebulization Q6H  . levothyroxine  175 mcg Oral Daily  . methocarbamol  500 mg Oral TID  . methylPREDNISolone (SOLU-MEDROL) injection  60 mg Intravenous Q12H  . morphine  15 mg Oral Q12H  . oxyCODONE  10 mg Oral  Q12H  . pantoprazole  40 mg Oral BID  . sertraline  100 mg Oral Daily  . simvastatin  20 mg Oral QHS  . tiZANidine  8 mg Oral TID   Continuous Infusions: .  promethazine (PHENERGAN) injection (IM or IVPB)       LOS: 2 days    Time spent: 73mns    SDeatra James MD [facility]   If 7PM-7AM, please contact night-coverage [email or web address]  07/18/2020, 1:35 PM

## 2020-07-18 NOTE — Progress Notes (Signed)
[facility]) Hospital Liaison note:  This patient is currently enrolled in [facility] outpatient-based Palliative Care. Will continue to follow for disposition.  Please call with any outpatient palliative questions or concerns.  Thank you, Lorelee Market, LPN [facility] Liaison [phone]

## 2020-07-18 NOTE — Progress Notes (Signed)
Met with [name] Fitzpatrick today as she was eating her lunch. She smiled when Chaplain introduced self and welcomed her bedside. She engaged as well as she was able in life review around her relationship with her husband, upbringing, children, and the amount of loss she has experienced in her life. She shared openly about her spiritual life. She also shared how much she wants to go home and how tired she is. Chaplain provided space for her to reflect and provided affirmation of faith and prayer bedside. Chaplain established relationship and will continue to provide support in order to assess for further spiritual need.

## 2020-07-18 NOTE — Progress Notes (Signed)
Manufacturing engineer [facility])   [name] Fitzpatrick is our current palliative patient that is now requesting hospice services once she is discharged.   Spoke with spouse, he was not aware that hospice was being discussed but said he was surprised. He confirms interest in help with hospice.   Patient has increased oxygen needs and [facility] will order additional O2. Per spouse, he cannot accept DME today and it will need to be delivered tomorrow.  Please arrange for comfort meds at time of discharge as well as completed DNR.  Thank you, Venia Carbon RN, BSN, [facility] Liaison
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
## 2020-07-18 NOTE — TOC Initial Note (Addendum)
Transition of Care [facility]) - Initial/Assessment Note    Patient Details  Name: [name] Fitzpatrick MRN: [id number] Date of Birth: 16-May-1955  Transition of Care [facility]) CM/SW Contact:    Boneta Lucks, RN Phone Number: 07/18/2020, 11:11 AM  Clinical Narrative:     Patient admitted with Acute on chronic respiratory failure with hypoxia. Patient is active with [facility] home oxygen up to 5L , Patient is currently on 7L. Caryl Pina with [facility] updated and plan is to dischcarge home tomorrow.  Patient is also active with [facility]. Palliative consulted. Patient still plans to discharge home. TOC will update Sherry at [employer] at discharge.            Addendum Leatrice Jewels Can not update concentrator, [place] office provided oxygen. If patient is going home with hospice, [facility] will need to set up oxygen. TOC called Sherry with [facility]. She is working on home set up.   Expected Discharge Plan: Home w Hospice Care Barriers to Discharge: Continued Medical Work up   Patient Goals and CMS Choice Patient states their goals for this hospitalization and ongoing recovery are:: to go home. CMS Medicare.gov Compare Post Acute Care list provided to:: Patient    Expected Discharge Plan and Services Expected Discharge Plan: [place] arrangements for the past 2 months: Single Family Home    Prior Living Arrangements/Services Living arrangements for the past 2 months: Single Family Home Lives with:: Spouse Patient language and need for interpreter reviewed:: Yes        Need for Family Participation in Patient Care: Yes (Comment) Care giver support system in place?: Yes (comment) Current home services: DME Criminal Activity/Legal Involvement Pertinent to Current Situation/Hospitalization: No - Comment as needed  Activities of Daily Living Home Assistive Devices/Equipment: Cane (specify quad or straight),Walker (specify type) ADL Screening (condition at time of  admission) Patient's cognitive ability adequate to safely complete daily activities?: Yes Is the patient deaf or have difficulty hearing?: No Does the patient have difficulty seeing, even when wearing glasses/contacts?: No Does the patient have difficulty concentrating, remembering, or making decisions?: No Patient able to express need for assistance with ADLs?: No Does the patient have difficulty dressing or bathing?: No Independently performs ADLs?: Yes (appropriate for developmental age) Does the patient have difficulty walking or climbing stairs?: Yes Weakness of Legs: Both Weakness of Arms/Hands: Both  Permission Sought/Granted   Emotional Assessment     Affect (typically observed): Accepting Orientation: : Oriented to Self,Oriented to Place,Oriented to Situation Alcohol / Substance Use: Not Applicable Psych Involvement: No (comment)  Admission diagnosis:  Acute on chronic respiratory failure with hypoxia ([place]) [J96.21] Patient Active Problem List   Diagnosis Date Noted  . Pulmonary fibrosis ([place]) 07/17/2020  . Community acquired pneumonia 07/16/2020  . Tachycardia 07/16/2020  . Acute on chronic respiratory failure ([place]) 05/25/2020  . Acute on chronic respiratory failure with hypoxia ([place])   . Seizure disorder ([place])   . Acute deep vein thrombosis (DVT) of left femoral vein ([place])   . Interstitial pneumonia ([place])   . ARDS (adult respiratory distress syndrome) ([place])   . Chest pain 09/09/2015  . Cellulitis of leg, left 09/09/2015  . Glossitis 09/09/2015  . Anxiety state 09/09/2015  . Headache, acute 09/08/2015  . Bilateral hand pain 06/07/2015  . Nausea without vomiting 06/06/2015  . 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  . Knee pain, left 04/03/2015  . Shoulder pain, right 04/03/2015  . Urinary incontinence 04/03/2015  .  Multiple lipomas 11/08/2013  . Compression fracture of L1 lumbar vertebra ([place]) 11/08/2013  . Fall at home, initial encounter 11/08/2013  .  Dyspnea 08/14/2012  . Obesity (BMI 30-39.9) 03/23/2012  . IGT (impaired glucose tolerance) 03/23/2012  . Vaginal dryness, menopausal 08/28/2011  . 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 Post traumatic stress disorder (PTSD) 07/02/2011    Class: Chronic  . COPD (chronic obstructive pulmonary disease) ([place]) 10/21/2010  . Pulmonary nodule 10/21/2010  . Headache 10/20/2010  . MVP (mitral valve prolapse) 08/23/2010  . IBS 10/12/2009  . Post-operative hypothyroidism 05/17/2006  . ALLERGIC RHINITIS 05/17/2006  . HOT FLASHES 05/17/2006  . Osteoarthritis 05/17/2006   PCP:  Lilian Coma., MD Pharmacy:   [facility], [address] AT [place] [place] [address] Phone: [phone] Fax: [phone]   Readmission Risk Interventions Readmission Risk Prevention Plan 07/18/2020  Transportation Screening Complete  Home Care Screening Complete  Medication Review (RN CM) Complete  Some recent data might be hidden

## 2020-07-19 DIAGNOSIS — J8410 Pulmonary fibrosis, unspecified: Secondary | ICD-10-CM | POA: Diagnosis not present

## 2020-07-19 DIAGNOSIS — J449 Chronic obstructive pulmonary disease, unspecified: Secondary | ICD-10-CM

## 2020-07-19 DIAGNOSIS — J9621 Acute and chronic respiratory failure with hypoxia: Secondary | ICD-10-CM | POA: Diagnosis not present

## 2020-07-19 MED ORDER — METHOCARBAMOL 500 MG PO TABS: 500.0000 mg | ORAL_TABLET | Freq: Three times a day (TID) | ORAL | 0 refills | Status: AC

## 2020-07-19 MED ORDER — BENZONATATE 200 MG PO CAPS: 200.0000 mg | ORAL_CAPSULE | Freq: Three times a day (TID) | ORAL | 0 refills | Status: AC

## 2020-07-19 MED ORDER — ALPRAZOLAM 0.5 MG PO TABS: 0.5000 mg | ORAL_TABLET | Freq: Two times a day (BID) | ORAL | 0 refills | Status: AC | PRN

## 2020-07-19 MED ORDER — BUDESONIDE 0.25 MG/2ML IN SUSP: 0.2500 mg | Freq: Two times a day (BID) | RESPIRATORY_TRACT | 12 refills | Status: DC

## 2020-07-19 MED ORDER — GABAPENTIN 300 MG PO CAPS: 300.0000 mg | ORAL_CAPSULE | Freq: Three times a day (TID) | ORAL | 0 refills | Status: DC

## 2020-07-19 MED ORDER — HYDROMORPHONE HCL 4 MG PO TABS: 4.0000 mg | ORAL_TABLET | ORAL | 0 refills | Status: AC | PRN

## 2020-07-19 MED ORDER — MORPHINE SULFATE ER 15 MG PO TBCR: 15.0000 mg | EXTENDED_RELEASE_TABLET | Freq: Two times a day (BID) | ORAL | 0 refills | Status: AC

## 2020-07-19 MED ORDER — PROMETHAZINE HCL 25 MG/ML IJ SOLN
INTRAMUSCULAR | Status: AC
  Filled 2020-07-19: qty 1

## 2020-07-19 MED ORDER — METHOCARBAMOL 500 MG PO TABS: 500.0000 mg | ORAL_TABLET | Freq: Three times a day (TID) | ORAL | Status: DC

## 2020-07-19 NOTE — Progress Notes (Addendum)
Hydrologist [facility] Hospital Liaison: RN note    Notified by Transition of Care Manger of patient/family request for [facility] services at home after discharge. Chart and patient information under review by [facility] physician. Hospice eligibility pending.   Writer spoke with  Spouse [name] to initiate education related to hospice philosophy, services and team approach to care. [name] verbalized understanding of information given.  Per discussion, plan is for discharge to home by personal care  today once oxygen in place.    Please send signed and completed DNR form home with patient/family. Patient will need prescriptions for discharge comfort medications.     DME needs have been discussed, patient currently has the following equipment in the home:Oxygen 2L concentrator.  Patient/family requests the following DME for delivery to the home: 10L concentrator.   [place] equipment manager has been notified and will contact DME provider to arrange delivery to the home. Home address has been verified and is correct in the chart. [name] is the family member to contact to arrange time of delivery.     [facility] Referral Center aware of the above. Please notify [facility] when patient is ready to leave the unit at discharge. (Call [phone] or [phone] after 5pm.) [facility] information and contact numbers given to [facility]      Please call with any hospice related questions.     Thank you for this referral.    Clementeen Hoof, BSN, [facility] (listed on [facility] under Hospice and [place] [facility] [phone]  [phone]

## 2020-07-19 NOTE — Discharge Summary (Signed)
Physician Discharge Summary [facility]    Patient: [name] Fitzpatrick                   Admit date: 07/15/2020   DOB: 1955/11/02             Discharge date:07/19/2020/11:40 AM ZOX:[id number]                          PCP: [name] Coma., MD  Disposition: HOME with hospice Recommendations for Outpatient Follow-up:   Follow up: Hospice at home within 1-2 days  Discharge Condition: Stable   Code Status:   Code Status: DNR  Diet recommendation: Regular healthy diet   Discharge Diagnoses:    Active Problems:   COPD (chronic obstructive pulmonary disease) ([place])   Fall at home, initial encounter   Acute on chronic respiratory failure with hypoxia (HCC)   Seizure disorder ([place])   Community acquired pneumonia   Tachycardia   Pulmonary fibrosis ([place])   History of Present Illness/ Hospital Course [name] Fitzpatrick Summary:   65 y/o female with end stage pulmonary fibrosis, chronic resp failure on 5-6L of oxygen, COPD, admitted with worsening shortness of breath and hypoxia, felt to be related to pulmonary fibrosis. She is on steroids and bronchodilators without significant improvement. Palliative care consulted for goals of care. I feel she would be appropriate for hospice  Subjective: The patient was seen and examined this morning, nursing staff present at bedside She is awake alert, still on 11 L of oxygen, satting 91% this morning Complained of generalized aches and pain -stating current pain medication not helping    Assessment & Plan:   Active Problems:   COPD (chronic obstructive pulmonary disease) ([place])   Fall at home, initial encounter   Acute on chronic respiratory failure with hypoxia (HCC)   Seizure disorder ([place])   Community acquired pneumonia   Tachycardia   Pulmonary fibrosis (HCC)   Acute on chronic respiratory failure with hypoxia -chronically on 5-6L of oxygen at home -She was noted to be 84% oxygen saturations on 12L, on admission required 15  L -This morning satting 96% on 8 L of oxygen   -Blood gas showed chronic CO2 retention, likely near baseline -CTA chest did not show any evidence of large PE, down to segmental vessels -suspect that this is being driven by her underlying advanced pulmonary fibrosis -she is currently on IV steroids, neb treatments, inhaled steroids -she is also on IV antibiotics for possible pneumonia -despite these treatments, patient appears to be really struggling with her breathing -palliative care consulted: Has met with the patient and family, agreed for continued palliative care follow-up, DNR/DNI status was established, goal of care to possibly go home with oxygen, may pursue hospice as an outpatient -will use IV dilaudid prn for air hunger, muscle relaxants scheduled pain medication Continuing MS Contin 12 mg every 12 hours, Zanaflex added  Chest pain -Still complaining overall rib cage pain and flank area, denies any anterior chest pain, troponin negative -no obvious PE on CTA -possibly related to underlying lung disease -she also recently had a fall and may have some muscular pain on her ribs -treat supportively with pain meds  Possible community acquired pneumonia -Was on IV antibiotics -Patient remained afebrile normotensive improved cough, previous water thought it was less likely pneumonia or sepsis subsequently antibiotics were discontinued -Will not initiate any further antibiotics  Fall/severe debility -will need physical therapy evaluation -Assist with all ADLs  Chronic pain syndrome -on chronic MS contin and oral dilaudid for breakthrough pain -she is also on zanaflex switched to Robaxin and gabapentin  HLD -We will discontinue statins  Hypothyroidism -continue on synthroid  Goals of care Palliative care was following closely, family meeting -Of care has been discussed in detail we will proceed with palliative care, will go home with hospice -Will arrange with  higher oxygen demand social worker currently assisting to arrange it  -Confirmed DNR/DNI status  -Patient's family is on board with goals of care ...      Code Status: DNR Family Communication: dicussed with husband  Disposition Plan:  Dispo: The patient is from: Home  Anticipated d/c is to: Home with hospice, supplemental oxygen   Discharge Instructions:   Discharge Instructions    Activity as tolerated - No restrictions   Complete by: As directed    Diet - low sodium heart healthy   Complete by: As directed    Discharge instructions   Complete by: As directed    Follow with hospice care   Increase activity slowly   Complete by: As directed        Medication List    STOP taking these medications   clonazePAM 1 MG tablet Commonly known as: KLONOPIN   predniSONE 50 MG tablet Commonly known as: DELTASONE   simvastatin 20 MG tablet Commonly known as: ZOCOR   tiZANidine 4 MG tablet Commonly known as: ZANAFLEX     TAKE these medications   acetaminophen 500 MG tablet Commonly known as: TYLENOL Take 1,000 mg by mouth every 6 (six) hours as needed.   alendronate 70 MG tablet Commonly known as: FOSAMAX Take 70 mg by mouth once a week.   ALPRAZolam 0.5 MG tablet Commonly known as: XANAX Take 1 tablet (0.5 mg total) by mouth 2 (two) times daily as needed for up to 10 days.   aspirin EC 81 MG tablet Take 81 mg by mouth daily.   benzonatate 200 MG capsule Commonly known as: TESSALON Take 1 capsule (200 mg total) by mouth 3 (three) times daily for 5 days. What changed: additional instructions   budesonide 0.25 MG/2ML nebulizer solution Commonly known as: PULMICORT Take 2 mLs (0.25 mg total) by nebulization 2 (two) times daily.   dicyclomine 10 MG capsule Commonly known as: BENTYL TAKE 1 CAPSULE BY MOUTH TWICE DAILY.   fluticasone 50 MCG/ACT nasal spray Commonly known as: FLONASE PLACE 2 SPRAYS INTO BOTH NOSTRILS DAILY   gabapentin 300  MG capsule Commonly known as: NEURONTIN Take 1 capsule (300 mg total) by mouth 3 (three) times daily. What changed:   medication strength  how much to take   HYDROmorphone 4 MG tablet Commonly known as: DILAUDID Take 1 tablet (4 mg total) by mouth every 4 (four) hours as needed for up to 3 days for moderate pain.   ipratropium-albuterol 0.5-2.5 (3) MG/3ML Soln Commonly known as: DUONEB Take 3 mLs by nebulization every 6 (six) hours as needed.   levothyroxine 175 MCG tablet Commonly known as: SYNTHROID Take 175 mcg by mouth daily.   methocarbamol 500 MG tablet Commonly known as: ROBAXIN Take 1 tablet (500 mg total) by mouth 3 (three) times daily.   morphine 15 MG 12 hr tablet Commonly known as: MS CONTIN Take 1 tablet (15 mg total) by mouth every 12 (twelve) hours for 5 days. What changed: when to take this   naloxone 4 MG/0.1ML Liqd nasal spray kit Commonly known as: NARCAN   pantoprazole 40  MG tablet Commonly known as: PROTONIX Take 40 mg by mouth 2 (two) times daily.   promethazine 25 MG tablet Commonly known as: PHENERGAN Take 1 tablet (25 mg total) by mouth daily as needed for nausea or vomiting.   sertraline 100 MG tablet Commonly known as: ZOLOFT Take 100 mg by mouth in the morning and at bedtime.   traZODone 100 MG tablet Commonly known as: DESYREL Take 100-200 mg by mouth at bedtime as needed for sleep.       Follow-up Information    [facility].   Contact information: [address] [place] [facility] Follow up.   Why:  Home Oxygen Contact information: [place] [address] [phone]              Allergies  Allergen Reactions  . Levofloxacin Shortness Of Breath and Itching    WHELPS WHELPS WHELPS WHELPS   . Penicillins Other (See Comments)    Stroke-like symptoms Has patient had a PCN reaction causing immediate rash,  facial/tongue/throat swelling, SOB or lightheadedness with hypotension: YES Has patient had a PCN reaction causing severe rash involving mucus membranes or skin necrosis: No Has patient had a PCN reaction that required hospitalization: No Has patient had a PCN reaction occurring within the last 10 years: Yes If all of the above answers are "NO", then may proceed with Cephalosporin use.   . Prednisone Shortness Of Breath and Nausea And Vomiting  . Cephalexin Hives, Swelling and Rash  . Latex Rash and Other (See Comments)    Causes skin to tear easily  . Metoclopramide Hcl Hives and Rash     Procedures /Studies:   DG Chest Port 1 View  Result Date: 07/15/2020 CLINICAL DATA:  Shortness of breath. EXAM: PORTABLE CHEST 1 VIEW COMPARISON:  CT chest 05/25/2020, chest x-ray 05/25/2020 FINDINGS: The heart size and mediastinal contours are unchanged. Enlarged pulmonary artery. Coarsened interstitial markings with patchy airspace opacities. No pleural effusion. No pneumothorax. No acute osseous abnormality. IMPRESSION: 1. Chronic interstitial disease with no superimposed diffuse patchy airspace opacities. Findings could represent infection/inflammation. COVID-19 infection not excluded. Followup PA and lateral chest X-ray is recommended in 3-4 weeks following therapy to ensure resolution and exclude underlying malignancy. 2. Enlarged pulmonary artery consistent with pulmonary hypertension. Electronically Signed   By: Iven Finn M.D.   On: 07/15/2020 21:46   CT Angio Chest/Abd/Pel for Dissection W and/or Wo Contrast  Result Date: 07/16/2020 CLINICAL DATA:  Chest pain radiating to the right-side. EXAM: CT ANGIOGRAPHY CHEST, ABDOMEN AND PELVIS TECHNIQUE: Non-contrast CT of the chest was initially obtained. Multidetector CT imaging through the chest, abdomen and pelvis was performed using the standard protocol during bolus administration of intravenous contrast. Multiplanar reconstructed images and MIPs  were obtained and reviewed to evaluate the vascular anatomy. CONTRAST:  179m OMNIPAQUE IOHEXOL 350 MG/ML SOLN COMPARISON:  May 25, 2020 FINDINGS: CTA CHEST FINDINGS Cardiovascular: There is mild to moderate severity calcification of the aortic arch, without evidence of aortic aneurysm or dissection. Satisfactory opacification of the pulmonary arteries to the segmental level. No evidence of pulmonary embolism. Normal heart size. No pericardial effusion. Mediastinum/Nodes: A 2.4 cm x 1.5 cm AP window lymph node is seen. This is increased in size when compared to the prior study. 1.9 cm x 1.8 cm and 2.4 cm x 2.2 cm right hilar lymph nodes are also noted. These are also increased in  size. Stable left hilar lymphadenopathy is present. Thyroid gland, trachea, and esophagus demonstrate no significant findings. Lungs/Pleura: Marked severity diffuse bilateral infiltrates are seen. This is increased in severity when compared to the prior study. Diffuse, chronic interstitial lung disease is also noted, bilaterally with persistent areas of bilateral bronchiectasis. There is no evidence of a pleural effusion or pneumothorax. Musculoskeletal: Multilevel degenerative changes seen throughout the thoracic spine. Review of the MIP images confirms the above findings. CTA ABDOMEN AND PELVIS FINDINGS VASCULAR Aorta: Normal caliber aorta without aneurysm, dissection, vasculitis or significant stenosis. Celiac: Patent without evidence of aneurysm, dissection, vasculitis or significant stenosis. SMA: Patent without evidence of aneurysm, dissection, vasculitis or significant stenosis. Renals: Both renal arteries are patent without evidence of aneurysm, dissection, vasculitis, fibromuscular dysplasia or significant stenosis. IMA: Patent without evidence of aneurysm, dissection, vasculitis or significant stenosis. Inflow: Patent without evidence of aneurysm, dissection, vasculitis or significant stenosis. Veins: An inferior vena cava filter  is in place. Review of the MIP images confirms the above findings. NON-VASCULAR Hepatobiliary: No focal liver abnormality is seen. Status post cholecystectomy. No biliary dilatation. Pancreas: Unremarkable. No pancreatic ductal dilatation or surrounding inflammatory changes. Spleen: Subcentimeter calcified granulomas are seen within the spleen. Adrenals/Urinary Tract: Adrenal glands are unremarkable. Kidneys are normal, without renal calculi, focal lesion, or hydronephrosis. Bladder is unremarkable. Stomach/Bowel: Stomach is within normal limits. The appendix is not clearly identified. No evidence of bowel wall thickening, distention, or inflammatory changes. Lymphatic: No abnormal abdominal or pelvic lymph nodes are identified. Reproductive: Status post hysterectomy. No adnexal masses. Other: No abdominal wall hernia or abnormality. No abdominopelvic ascites. Musculoskeletal: There is evidence of prior vertebroplasty at the level of L1. Bilateral pedicle screws are seen at the levels of L4, L5 and S1 with operative material also seen within the intervertebral disc spaces at these levels. Review of the MIP images confirms the above findings. IMPRESSION: 1. No evidence of aneurysmal dilatation or dissection of the thoracic or abdominal aorta. 2. Diffuse chronic interstitial lung disease with worsening, superimposed marked severity diffuse bilateral infiltrates. 3. Worsening mediastinal and right hilar lymphadenopathy. 4. Evidence of prior cholecystectomy and hysterectomy. 5. Postoperative changes within the lower lumbar spine. Electronically Signed   By: Virgina Norfolk M.D.   On: 07/16/2020 00:59     Subjective:   Patient was seen and examined 07/19/2020, 11:40 AM Patient stable today. No acute distress.  No issues overnight Stable for discharge.  Discharge Exam:    Vitals:   07/19/20 0800 07/19/20 0853 07/19/20 0857 07/19/20 1124  BP: (!) 129/59     Pulse: (!) 57   63  Resp: 12   17  Temp:    98.9  F (37.2 C)  TempSrc:    Oral  SpO2: 98% 95% 100% 96%  Weight:      Height:        General: Laying in bed in mild to moderate distress, still complaining of pain, shortness of breath on high flow oxygen Cardiovascular: S1 & S2 heard, RRR, S1/S2 +. No murmurs, rubs, gallops or clicks. No JVD or pedal edema. Respiratory: Poor air entry at lower lobes  Diffuse rhonchi, negative for wheezing, improved breathing  Abdominal:  Non-distended, non-tender & soft. No organomegaly or masses appreciated. Normal bowel sounds heard. CNS: Alert and oriented. No focal deficits. Extremities: Severe generalized weaknesses no edema, no cyanosis      The results of significant diagnostics from this hospitalization (including imaging, microbiology, ancillary and laboratory) are listed below for reference.  Microbiology:   Recent Results (from the past 240 hour(s))  Urine culture     Status: Abnormal   Collection Time: 07/15/20  3:57 AM   Specimen: Urine, Clean Catch  Result Value Ref Range Status   Specimen Description   Final    URINE, CLEAN CATCH Performed at [facility], [address] [place], [place] [address]    Special Requests   Final    NONE Performed at [facility], [address] [place], [place] [address]    Culture (A)  Final    <10,000 COLONIES/mL INSIGNIFICANT GROWTH Performed at [facility] [address], [place], [place] [address]    Report Status 07/17/2020 FINAL  Final  Blood culture (routine single)     Status: None (Preliminary result)   Collection Time: 07/15/20  9:44 PM   Specimen: BLOOD LEFT HAND  Result Value Ref Range Status   Specimen Description BLOOD LEFT HAND  Final   Special Requests   Final    Blood Culture results may not be optimal due to an excessive volume of blood received in culture bottles BOTTLES DRAWN AEROBIC AND ANAEROBIC   Culture   Final    NO GROWTH 4 DAYS Performed at [facility], [address], [place], [place] [address]     Report Status PENDING  Incomplete  SARS CORONAVIRUS 2 (TAT 6-24 HRS) Nasopharyngeal Nasopharyngeal Swab     Status: None   Collection Time: 07/15/20  9:50 PM   Specimen: Nasopharyngeal Swab  Result Value Ref Range Status   SARS Coronavirus 2 NEGATIVE NEGATIVE Final    Comment: (NOTE) SARS-CoV-2 target nucleic acids are NOT DETECTED.  The SARS-CoV-2 RNA is generally detectable in upper and lower respiratory specimens during the acute phase of infection. Negative results do not preclude SARS-CoV-2 infection, do not rule out co-infections with other pathogens, and should not be used as the sole basis for treatment or other patient management decisions. Negative results must be combined with clinical observations, patient history, and epidemiological information. The expected result is Negative.  Fact Sheet for Patients: SugarRoll.be  Fact Sheet for Healthcare Providers: https://www.woods-mathews.com/  This test is not yet approved or cleared by the [place] FDA and  has been authorized for detection and/or diagnosis of SARS-CoV-2 by FDA under an Emergency Use Authorization (EUA). This EUA will remain  in effect (meaning this test can be used) for the duration of the COVID-19 declaration under Se ction 564(b)(1) of the Act, 21 U.S.C. section 360bbb-3(b)(1), unless the authorization is terminated or revoked sooner.  Performed at [facility], [place] [address] [place], [place] [address]   Resp Panel by RT-PCR (Flu A&B, Covid) Nasopharyngeal Swab     Status: None   Collection Time: 07/16/20  1:48 AM   Specimen: Nasopharyngeal Swab; Nasopharyngeal(NP) swabs in vial transport medium  Result Value Ref Range Status   SARS Coronavirus 2 by RT PCR NEGATIVE NEGATIVE Final    Comment: (NOTE) SARS-CoV-2 target nucleic acids are NOT DETECTED.  The SARS-CoV-2 RNA is generally detectable in upper respiratory specimens during the acute  phase of infection. The lowest concentration of SARS-CoV-2 viral copies this assay can detect is 138 copies/mL. A negative result does not preclude SARS-Cov-2 infection and should not be used as the sole basis for treatment or other patient management decisions. A negative result may occur with  improper specimen collection/handling, submission of specimen other than nasopharyngeal swab, presence of viral mutation(s) within the areas targeted by this  assay, and inadequate number of viral copies(<138 copies/mL). A negative result must be combined with clinical observations, patient history, and epidemiological information. The expected result is Negative.  Fact Sheet for Patients:  EntrepreneurPulse.com.au  Fact Sheet for Healthcare Providers:  IncredibleEmployment.be  This test is no t yet approved or cleared by the [place] FDA and  has been authorized for detection and/or diagnosis of SARS-CoV-2 by FDA under an Emergency Use Authorization (EUA). This EUA will remain  in effect (meaning this test can be used) for the duration of the COVID-19 declaration under Section 564(b)(1) of the Act, 21 U.S.C.section 360bbb-3(b)(1), unless the authorization is terminated  or revoked sooner.       Influenza A by PCR NEGATIVE NEGATIVE Final   Influenza B by PCR NEGATIVE NEGATIVE Final    Comment: (NOTE) The Xpert Xpress SARS-CoV-2/FLU/RSV plus assay is intended as an aid in the diagnosis of influenza from Nasopharyngeal swab specimens and should not be used as a sole basis for treatment. Nasal washings and aspirates are unacceptable for Xpert Xpress SARS-CoV-2/FLU/RSV testing.  Fact Sheet for Patients: EntrepreneurPulse.com.au  Fact Sheet for Healthcare Providers: IncredibleEmployment.be  This test is not yet approved or cleared by the [place] FDA and has been authorized for detection and/or diagnosis of  SARS-CoV-2 by FDA under an Emergency Use Authorization (EUA). This EUA will remain in effect (meaning this test can be used) for the duration of the COVID-19 declaration under Section 564(b)(1) of the Act, 21 U.S.C. section 360bbb-3(b)(1), unless the authorization is terminated or revoked.  Performed at [facility], [address], [place], [place] [address]   MRSA PCR Screening     Status: None   Collection Time: 07/16/20  9:24 AM   Specimen: Nasal Mucosa; Nasopharyngeal  Result Value Ref Range Status   MRSA by PCR NEGATIVE NEGATIVE Final    Comment:        The GeneXpert MRSA Assay (FDA approved for NASAL specimens only), is one component of a comprehensive MRSA colonization surveillance program. It is not intended to diagnose MRSA infection nor to guide or monitor treatment for MRSA infections. Performed at [facility], [address] [place], [place] [address]      Labs:   CBC: Recent Labs  Lab 07/15/20 2033 07/17/20 0402  WBC 17.1* 9.5  NEUTROABS 14.6*  --   HGB 11.5* 9.3*  HCT 37.9 30.3*  MCV 91.3 92.7  PLT 441* 016   Basic Metabolic Panel: Recent Labs  Lab 07/15/20 2033 07/17/20 0402  NA 134* 135  K 3.7 4.3  CL 98 100  CO2 26 26  GLUCOSE 123* 122*  BUN 12 11  CREATININE 0.65 0.44  CALCIUM 9.1 8.4*  MG  --  2.1   Liver Function Tests: Recent Labs  Lab 07/15/20 2033 07/17/20 0402  AST 15 15  ALT 10 12  ALKPHOS 73 57  BILITOT 0.6 0.6  PROT 7.8 6.3*  ALBUMIN 3.6 2.8*   BNP (last 3 results) Recent Labs    07/15/20 2144  BNP 337.0*    Urinalysis    Component Value Date/Time   COLORURINE STRAW (A) 07/15/2020 [place] 07/15/2020 0357   LABSPEC 1.010 07/15/2020 [place] 5.5 07/15/2020 [place] 07/15/2020 0357   HGBUR NEGATIVE 07/15/2020 0357   HGBUR negative 05/17/2006 0000   BILIRUBINUR NEGATIVE 07/15/2020 0357   BILIRUBINUR small 03/30/2015 [place] 07/15/2020 0357    PROTEINUR TRACE (A) 07/15/2020  0357   UROBILINOGEN 0.2 03/30/2015 1546   UROBILINOGEN 0.2 11/08/2013 0237   NITRITE NEGATIVE 07/15/2020 0357   LEUKOCYTESUR NEGATIVE 07/15/2020 0357         Time coordinating discharge: Over 45 minutes  SIGNED: Deatra James, MD, FACP, [facility]. [facility],  Please use [email or web address] to Page If 7PM-7AM, please contact night-coverage [email or web address], Password [facility] 07/19/2020, 11:40 AM

## 2020-07-19 NOTE — TOC Transition Note (Signed)
Transition of Care [facility]) - CM/SW Discharge Note   Patient Details  Name: [name] Fitzpatrick MRN: [id number] Date of Birth: October 13, 1955  [facility]) CM/SW Contact:  Boneta Lucks, RN Phone Number: 07/19/2020, 12:04 PM   Clinical Narrative:   [facility] confirmed that oxygen has been set up in the home. Husband wants to transport patient home.  MD/RN updated to give patient scripts for comfort and pain. Patient is now with [place] care at home.   Final next level of care: Home w Hospice Care Barriers to Discharge: Barriers Resolved   Patient Goals and CMS Choice Patient states their goals for this hospitalization and ongoing recovery are:: to go home. CMS Medicare.gov Compare Post Acute Care list provided to:: Patient    Discharge Placement         Patient to be transferred to facility by: Husband to transport home Name of family member notified: Husband Patient and family notified of of transfer: 07/19/20  Discharge Plan and Services   Readmission Risk Interventions Readmission Risk Prevention Plan 07/18/2020  Transportation Screening Complete  Home Care Screening Complete  Medication Review (RN CM) Complete  Some recent data might be hidden

## 2020-07-20 LAB — CULTURE, BLOOD (SINGLE): Culture: NO GROWTH

## 2022-02-05 ENCOUNTER — Encounter (INDEPENDENT_AMBULATORY_CARE_PROVIDER_SITE_OTHER): Payer: Self-pay | Admitting: Gastroenterology

## 2022-05-09 ENCOUNTER — Encounter (HOSPITAL_COMMUNITY): Payer: Self-pay | Admitting: *Deleted

## 2022-05-09 ENCOUNTER — Inpatient Hospital Stay (HOSPITAL_COMMUNITY)
Admission: EM | Admit: 2022-05-09 | Discharge: 2022-05-24 | DRG: 196 | Disposition: A | Discharge status: Home Health | Payer: Medicare HMO | Attending: Family Medicine | Admitting: Family Medicine

## 2022-05-09 ENCOUNTER — Emergency Department (HOSPITAL_COMMUNITY): Payer: Medicare HMO

## 2022-05-09 ENCOUNTER — Other Ambulatory Visit: Payer: Self-pay

## 2022-05-09 DIAGNOSIS — I4510 Unspecified right bundle-branch block: Secondary | ICD-10-CM | POA: Diagnosis present

## 2022-05-09 DIAGNOSIS — Z880 Allergy status to penicillin: Secondary | ICD-10-CM

## 2022-05-09 DIAGNOSIS — D72829 Elevated white blood cell count, unspecified: Secondary | ICD-10-CM | POA: Diagnosis present

## 2022-05-09 DIAGNOSIS — J441 Chronic obstructive pulmonary disease with (acute) exacerbation: Secondary | ICD-10-CM | POA: Diagnosis present

## 2022-05-09 DIAGNOSIS — Z9151 Personal history of suicidal behavior: Secondary | ICD-10-CM

## 2022-05-09 DIAGNOSIS — Z79899 Other long term (current) drug therapy: Secondary | ICD-10-CM

## 2022-05-09 DIAGNOSIS — Z87891 Personal history of nicotine dependence: Secondary | ICD-10-CM

## 2022-05-09 DIAGNOSIS — Z888 Allergy status to other drugs, medicaments and biological substances status: Secondary | ICD-10-CM

## 2022-05-09 DIAGNOSIS — J9622 Acute and chronic respiratory failure with hypercapnia: Secondary | ICD-10-CM | POA: Diagnosis present

## 2022-05-09 DIAGNOSIS — I10 Essential (primary) hypertension: Secondary | ICD-10-CM | POA: Diagnosis present

## 2022-05-09 DIAGNOSIS — E871 Hypo-osmolality and hyponatremia: Secondary | ICD-10-CM | POA: Diagnosis present

## 2022-05-09 DIAGNOSIS — G40909 Epilepsy, unspecified, not intractable, without status epilepticus: Secondary | ICD-10-CM | POA: Diagnosis present

## 2022-05-09 DIAGNOSIS — F410 Panic disorder [episodic paroxysmal anxiety] without agoraphobia: Secondary | ICD-10-CM | POA: Diagnosis present

## 2022-05-09 DIAGNOSIS — D638 Anemia in other chronic diseases classified elsewhere: Secondary | ICD-10-CM | POA: Diagnosis present

## 2022-05-09 DIAGNOSIS — Z79890 Hormone replacement therapy (postmenopausal): Secondary | ICD-10-CM

## 2022-05-09 DIAGNOSIS — E782 Mixed hyperlipidemia: Secondary | ICD-10-CM | POA: Diagnosis present

## 2022-05-09 DIAGNOSIS — F411 Generalized anxiety disorder: Secondary | ICD-10-CM | POA: Diagnosis present

## 2022-05-09 DIAGNOSIS — Z881 Allergy status to other antibiotic agents status: Secondary | ICD-10-CM

## 2022-05-09 DIAGNOSIS — F1120 Opioid dependence, uncomplicated: Secondary | ICD-10-CM | POA: Diagnosis present

## 2022-05-09 DIAGNOSIS — J9621 Acute and chronic respiratory failure with hypoxia: Principal | ICD-10-CM | POA: Diagnosis present

## 2022-05-09 DIAGNOSIS — Z8349 Family history of other endocrine, nutritional and metabolic diseases: Secondary | ICD-10-CM

## 2022-05-09 DIAGNOSIS — Z8249 Family history of ischemic heart disease and other diseases of the circulatory system: Secondary | ICD-10-CM

## 2022-05-09 DIAGNOSIS — I4891 Unspecified atrial fibrillation: Secondary | ICD-10-CM | POA: Diagnosis present

## 2022-05-09 DIAGNOSIS — J84112 Idiopathic pulmonary fibrosis: Secondary | ICD-10-CM | POA: Diagnosis not present

## 2022-05-09 DIAGNOSIS — T380X5A Adverse effect of glucocorticoids and synthetic analogues, initial encounter: Secondary | ICD-10-CM | POA: Diagnosis not present

## 2022-05-09 DIAGNOSIS — M545 Low back pain, unspecified: Secondary | ICD-10-CM | POA: Diagnosis present

## 2022-05-09 DIAGNOSIS — M1990 Unspecified osteoarthritis, unspecified site: Secondary | ICD-10-CM | POA: Diagnosis present

## 2022-05-09 DIAGNOSIS — Z91040 Latex allergy status: Secondary | ICD-10-CM

## 2022-05-09 DIAGNOSIS — K219 Gastro-esophageal reflux disease without esophagitis: Secondary | ICD-10-CM | POA: Diagnosis present

## 2022-05-09 DIAGNOSIS — Z9981 Dependence on supplemental oxygen: Secondary | ICD-10-CM

## 2022-05-09 DIAGNOSIS — Z90710 Acquired absence of both cervix and uterus: Secondary | ICD-10-CM

## 2022-05-09 DIAGNOSIS — E1165 Type 2 diabetes mellitus with hyperglycemia: Secondary | ICD-10-CM | POA: Diagnosis not present

## 2022-05-09 DIAGNOSIS — Z9049 Acquired absence of other specified parts of digestive tract: Secondary | ICD-10-CM

## 2022-05-09 DIAGNOSIS — R519 Headache, unspecified: Secondary | ICD-10-CM | POA: Diagnosis present

## 2022-05-09 DIAGNOSIS — Z7982 Long term (current) use of aspirin: Secondary | ICD-10-CM

## 2022-05-09 DIAGNOSIS — J309 Allergic rhinitis, unspecified: Secondary | ICD-10-CM | POA: Diagnosis present

## 2022-05-09 DIAGNOSIS — Z86718 Personal history of other venous thrombosis and embolism: Secondary | ICD-10-CM

## 2022-05-09 DIAGNOSIS — Z1152 Encounter for screening for COVID-19: Secondary | ICD-10-CM

## 2022-05-09 DIAGNOSIS — K5900 Constipation, unspecified: Secondary | ICD-10-CM | POA: Diagnosis not present

## 2022-05-09 DIAGNOSIS — Z7951 Long term (current) use of inhaled steroids: Secondary | ICD-10-CM

## 2022-05-09 DIAGNOSIS — F32A Depression, unspecified: Secondary | ICD-10-CM | POA: Diagnosis present

## 2022-05-09 DIAGNOSIS — R042 Hemoptysis: Secondary | ICD-10-CM | POA: Diagnosis not present

## 2022-05-09 DIAGNOSIS — Z981 Arthrodesis status: Secondary | ICD-10-CM

## 2022-05-09 DIAGNOSIS — E119 Type 2 diabetes mellitus without complications: Secondary | ICD-10-CM

## 2022-05-09 DIAGNOSIS — G8929 Other chronic pain: Secondary | ICD-10-CM | POA: Diagnosis present

## 2022-05-09 DIAGNOSIS — R040 Epistaxis: Secondary | ICD-10-CM | POA: Diagnosis present

## 2022-05-09 DIAGNOSIS — J019 Acute sinusitis, unspecified: Secondary | ICD-10-CM | POA: Diagnosis present

## 2022-05-09 DIAGNOSIS — E039 Hypothyroidism, unspecified: Secondary | ICD-10-CM | POA: Diagnosis present

## 2022-05-09 DIAGNOSIS — J8410 Pulmonary fibrosis, unspecified: Secondary | ICD-10-CM

## 2022-05-09 DIAGNOSIS — K589 Irritable bowel syndrome without diarrhea: Secondary | ICD-10-CM | POA: Diagnosis present

## 2022-05-09 DIAGNOSIS — Q2112 Patent foramen ovale: Secondary | ICD-10-CM

## 2022-05-09 DIAGNOSIS — R000 Tachycardia, unspecified: Secondary | ICD-10-CM | POA: Diagnosis not present

## 2022-05-09 DIAGNOSIS — Z7983 Long term (current) use of bisphosphonates: Secondary | ICD-10-CM

## 2022-05-09 DIAGNOSIS — M797 Fibromyalgia: Secondary | ICD-10-CM | POA: Diagnosis present

## 2022-05-09 DIAGNOSIS — E890 Postprocedural hypothyroidism: Secondary | ICD-10-CM | POA: Diagnosis present

## 2022-05-09 LAB — CBC WITH DIFFERENTIAL/PLATELET
Abs Immature Granulocytes: 0.03 10*3/uL (ref 0.00–0.07)
Basophils Absolute: 0.1 10*3/uL (ref 0.0–0.1)
Basophils Relative: 0 %
Eosinophils Absolute: 0 10*3/uL (ref 0.0–0.5)
Eosinophils Relative: 0 %
HCT: 32.6 % — ABNORMAL LOW (ref 36.0–46.0)
Hemoglobin: 10.2 g/dL — ABNORMAL LOW (ref 12.0–15.0)
Immature Granulocytes: 0 %
Lymphocytes Relative: 14 %
Lymphs Abs: 1.7 10*3/uL (ref 0.7–4.0)
MCH: 27.9 pg (ref 26.0–34.0)
MCHC: 31.3 g/dL (ref 30.0–36.0)
MCV: 89.3 fL (ref 80.0–100.0)
Monocytes Absolute: 0.5 10*3/uL (ref 0.1–1.0)
Monocytes Relative: 4 %
Neutro Abs: 9.5 10*3/uL — ABNORMAL HIGH (ref 1.7–7.7)
Neutrophils Relative %: 82 %
Platelets: 222 10*3/uL (ref 150–400)
RBC: 3.65 MIL/uL — ABNORMAL LOW (ref 3.87–5.11)
RDW: 14.5 % (ref 11.5–15.5)
WBC: 11.8 10*3/uL — ABNORMAL HIGH (ref 4.0–10.5)
nRBC: 0 % (ref 0.0–0.2)

## 2022-05-09 LAB — MAGNESIUM: Magnesium: 1.8 mg/dL (ref 1.7–2.4)

## 2022-05-09 LAB — BRAIN NATRIURETIC PEPTIDE: B Natriuretic Peptide: 274 pg/mL — ABNORMAL HIGH (ref 0.0–100.0)

## 2022-05-09 LAB — HEPATIC FUNCTION PANEL
ALT: 13 U/L (ref 0–44)
AST: 18 U/L (ref 15–41)
Albumin: 3.6 g/dL (ref 3.5–5.0)
Alkaline Phosphatase: 57 U/L (ref 38–126)
Bilirubin, Direct: 0.1 mg/dL (ref 0.0–0.2)
Indirect Bilirubin: 0.3 mg/dL (ref 0.3–0.9)
Total Bilirubin: 0.4 mg/dL (ref 0.3–1.2)
Total Protein: 6.7 g/dL (ref 6.5–8.1)

## 2022-05-09 LAB — BASIC METABOLIC PANEL
Anion gap: 10 (ref 5–15)
BUN: 20 mg/dL (ref 8–23)
CO2: 26 mmol/L (ref 22–32)
Calcium: 9.1 mg/dL (ref 8.9–10.3)
Chloride: 98 mmol/L (ref 98–111)
Creatinine, Ser: 0.74 mg/dL (ref 0.44–1.00)
GFR, Estimated: >60 mL/min (ref >60)
Glucose, Bld: 110 mg/dL — ABNORMAL HIGH (ref 70–99)
Potassium: 4.1 mmol/L (ref 3.5–5.1)
Sodium: 134 mmol/L — ABNORMAL LOW (ref 135–145)

## 2022-05-09 LAB — PROTIME-INR
INR: 1 (ref 0.8–1.2)
Prothrombin Time: 13.5 seconds (ref 11.4–15.2)

## 2022-05-09 LAB — RESP PANEL BY RT-PCR (RSV, FLU A&B, COVID)  RVPGX2
Influenza A by PCR: NEGATIVE
Influenza B by PCR: NEGATIVE
Resp Syncytial Virus by PCR: NEGATIVE
SARS Coronavirus 2 by RT PCR: NEGATIVE

## 2022-05-09 LAB — TSH: TSH: 0.445 u[IU]/mL (ref 0.350–4.500)

## 2022-05-09 LAB — LIPASE, BLOOD: Lipase: 25 U/L (ref 11–51)

## 2022-05-09 LAB — TROPONIN I (HIGH SENSITIVITY): Troponin I (High Sensitivity): 11 ng/L (ref <18)

## 2022-05-09 MED ORDER — LORAZEPAM 2 MG/ML IJ SOLN
0.5000 mg | Freq: Once | INTRAMUSCULAR | Status: AC
  Administered 2022-05-09: 0.5 mg via INTRAVENOUS
  Filled 2022-05-09: qty 1

## 2022-05-09 MED ORDER — METHYLPREDNISOLONE SODIUM SUCC 125 MG IJ SOLR
80.0000 mg | Freq: Once | INTRAMUSCULAR | Status: AC
  Administered 2022-05-09: 80 mg via INTRAVENOUS
  Filled 2022-05-09: qty 2

## 2022-05-09 MED ORDER — FENTANYL CITRATE PF 50 MCG/ML IJ SOSY
50.0000 ug | PREFILLED_SYRINGE | Freq: Once | INTRAMUSCULAR | Status: AC
  Administered 2022-05-09: 50 ug via INTRAVENOUS
  Filled 2022-05-09: qty 1

## 2022-05-09 MED ORDER — ASPIRIN 81 MG PO CHEW
324.0000 mg | CHEWABLE_TABLET | Freq: Once | ORAL | Status: AC
  Administered 2022-05-09: 324 mg via ORAL
  Filled 2022-05-09: qty 4

## 2022-05-09 MED ORDER — IPRATROPIUM-ALBUTEROL 0.5-2.5 (3) MG/3ML IN SOLN
3.0000 mL | Freq: Once | RESPIRATORY_TRACT | Status: AC
  Administered 2022-05-09: 3 mL via RESPIRATORY_TRACT
  Filled 2022-05-09: qty 3

## 2022-05-09 MED ORDER — NITROGLYCERIN 2 % TD OINT
1.0000 [in_us] | TOPICAL_OINTMENT | Freq: Once | TRANSDERMAL | Status: AC
  Administered 2022-05-09: 1 [in_us] via TOPICAL
  Filled 2022-05-09: qty 1

## 2022-05-09 MED ORDER — IOHEXOL 350 MG/ML SOLN
100.0000 mL | Freq: Once | INTRAVENOUS | Status: AC | PRN
  Administered 2022-05-09: 100 mL via INTRAVENOUS

## 2022-05-09 MED ORDER — KETOROLAC TROMETHAMINE 15 MG/ML IJ SOLN
15.0000 mg | Freq: Once | INTRAMUSCULAR | Status: AC
  Administered 2022-05-09: 15 mg via INTRAVENOUS
  Filled 2022-05-09: qty 1

## 2022-05-09 NOTE — ED Triage Notes (Signed)
Pt with c/o generalized CP today and now radiates up left neck.  Pt states she has had nosebleeds earlier today, no bleeding at  present. Pt denies taking blood thinners.  Pt state she had a HA and emesis on Friday. Pt on home O2 at 4 L/M.  Pt states right sided HA since waking up this morning, not able to state exact time.

## 2022-05-09 NOTE — ED Notes (Signed)
Unable to get blood work with IV

## 2022-05-09 NOTE — ED Provider Notes (Signed)
[facility] Provider Note   CSN: [id number] Arrival date & time: 05/09/22  2106     History {Add pertinent medical, surgical, social history, OB history to HPI:1} Chief Complaint  Patient presents with   Chest Pain    [name] Fitzpatrick is a 67 y.o. female.   Chest Pain Patient presents for chest pain.  Medical history includes***.  Onset of chest pain was today.  She has had recent headache, including today.     Home Medications Prior to Admission medications   Medication Sig Start Date End Date Taking? Authorizing Provider  acetaminophen (TYLENOL) 500 MG tablet Take 1,000 mg by mouth every 6 (six) hours as needed.    [provider]  alendronate (FOSAMAX) 70 MG tablet Take 70 mg by mouth once a week. 04/18/20   [provider]  aspirin EC 81 MG tablet Take 81 mg by mouth daily.    [provider]  budesonide (PULMICORT) 0.25 MG/2ML nebulizer solution Take 2 mLs (0.25 mg total) by nebulization 2 (two) times daily. 07/19/20   Shahmehdi, Valeria Batman, MD  dicyclomine (BENTYL) 10 MG capsule TAKE 1 CAPSULE BY MOUTH TWICE DAILY. 09/03/14   Fayrene Helper, MD  fluticasone [facility]) 50 MCG/ACT nasal spray PLACE 2 SPRAYS INTO BOTH NOSTRILS DAILY 04/11/16   Fayrene Helper, MD  gabapentin (NEURONTIN) 300 MG capsule Take 1 capsule (300 mg total) by mouth 3 (three) times daily. 07/19/20 08/18/20  Shahmehdi, Valeria Batman, MD  ipratropium-albuterol (DUONEB) 0.5-2.5 (3) MG/3ML SOLN Take 3 mLs by nebulization every 6 (six) hours as needed. 05/31/20   Elgergawy, Silver Huguenin, MD  levothyroxine (SYNTHROID) 175 MCG tablet Take 175 mcg by mouth daily. 03/31/20   [provider]  naloxone [facility]) nasal spray 4 mg/0.1 mL  06/01/20   [provider]  pantoprazole (PROTONIX) 40 MG tablet Take 40 mg by mouth 2 (two) times daily. 05/20/20   [provider]  promethazine (PHENERGAN) 25 MG tablet Take 1 tablet (25 mg total)  by mouth daily as needed for nausea or vomiting. 07/07/15   Fayrene Helper, MD  sertraline (ZOLOFT) 100 MG tablet Take 100 mg by mouth in the morning and at bedtime.    [provider]  traZODone (DESYREL) 100 MG tablet Take 100-200 mg by mouth at bedtime as needed for sleep.    [provider]      Allergies    Levofloxacin, Penicillins, Prednisone, Cephalexin, Latex, and Metoclopramide hcl    Review of Systems   Review of Systems  Cardiovascular:  Positive for chest pain.    Physical Exam Updated Vital Signs BP (!) 139/110 (BP Location: Right Arm)   Pulse 91   Temp 98 F (36.7 C) (Oral)   Resp (!) 24   Ht 5' 2"$  (1.575 m)   Wt 58.1 kg   SpO2 (!) 88%   BMI 23.41 kg/m  Physical Exam  ED Results / Procedures / Treatments   Labs (all labs ordered are listed, but only abnormal results are displayed) Labs Reviewed - No data to display  EKG EKG Interpretation  Date/Time:  Wednesday May 09 2022 21:21:41 EST Ventricular Rate:  92 PR Interval:  142 QRS Duration: 94 QT Interval:  358 QTC Calculation: 442 R Axis:   97 Text Interpretation: Normal sinus rhythm Incomplete right bundle branch block Possible Right ventricular hypertrophy Abnormal ECG Confirmed by Godfrey Pick ([id number]) on 05/09/2022 9:29:00 PM  Radiology No results found.  Procedures Procedures  {Document cardiac monitor, telemetry assessment procedure when appropriate:1}  Medications Ordered in ED Medications - No data to display  ED Course/ Medical Decision Making/ A&P   {   Click here for ABCD2, HEART and other calculatorsREFRESH Note before signing :1}                          Medical Decision Making  ***  {Document critical care time when appropriate:1} {Document review of labs and clinical decision tools ie heart score, Chads2Vasc2 etc:1}  {Document your independent review of radiology images, and any outside records:1} {Document your discussion with family members,  caretakers, and with consultants:1} {Document social determinants of health affecting pt's care:1} {Document your decision making why or why not admission, treatments were needed:1} Final Clinical Impression(s) / ED Diagnoses Final diagnoses:  None    Rx / DC Orders ED Discharge Orders     None

## 2022-05-09 NOTE — ED Notes (Signed)
RT in room.

## 2022-05-09 NOTE — ED Notes (Signed)
Patient transported to CT 

## 2022-05-09 NOTE — ED Notes (Signed)
Patient refusing to breathe through nose to get oxygen. Pt saturation 70%. Encouraged to breathe in through nose. Pt states "easier said than done"

## 2022-05-10 ENCOUNTER — Encounter (HOSPITAL_COMMUNITY): Payer: Self-pay | Admitting: Internal Medicine

## 2022-05-10 DIAGNOSIS — I4891 Unspecified atrial fibrillation: Secondary | ICD-10-CM | POA: Diagnosis present

## 2022-05-10 DIAGNOSIS — D72829 Elevated white blood cell count, unspecified: Secondary | ICD-10-CM

## 2022-05-10 DIAGNOSIS — J849 Interstitial pulmonary disease, unspecified: Secondary | ICD-10-CM | POA: Diagnosis not present

## 2022-05-10 DIAGNOSIS — J9621 Acute and chronic respiratory failure with hypoxia: Secondary | ICD-10-CM | POA: Diagnosis present

## 2022-05-10 DIAGNOSIS — Z515 Encounter for palliative care: Secondary | ICD-10-CM | POA: Diagnosis not present

## 2022-05-10 DIAGNOSIS — E039 Hypothyroidism, unspecified: Secondary | ICD-10-CM

## 2022-05-10 DIAGNOSIS — E119 Type 2 diabetes mellitus without complications: Secondary | ICD-10-CM

## 2022-05-10 DIAGNOSIS — R079 Chest pain, unspecified: Secondary | ICD-10-CM | POA: Diagnosis not present

## 2022-05-10 DIAGNOSIS — E890 Postprocedural hypothyroidism: Secondary | ICD-10-CM | POA: Diagnosis present

## 2022-05-10 DIAGNOSIS — E1165 Type 2 diabetes mellitus with hyperglycemia: Secondary | ICD-10-CM | POA: Diagnosis not present

## 2022-05-10 DIAGNOSIS — J84112 Idiopathic pulmonary fibrosis: Secondary | ICD-10-CM | POA: Diagnosis present

## 2022-05-10 DIAGNOSIS — F1120 Opioid dependence, uncomplicated: Secondary | ICD-10-CM | POA: Diagnosis present

## 2022-05-10 DIAGNOSIS — J301 Allergic rhinitis due to pollen: Secondary | ICD-10-CM

## 2022-05-10 DIAGNOSIS — E871 Hypo-osmolality and hyponatremia: Secondary | ICD-10-CM | POA: Diagnosis present

## 2022-05-10 DIAGNOSIS — I10 Essential (primary) hypertension: Secondary | ICD-10-CM | POA: Diagnosis present

## 2022-05-10 DIAGNOSIS — E782 Mixed hyperlipidemia: Secondary | ICD-10-CM | POA: Diagnosis present

## 2022-05-10 DIAGNOSIS — F410 Panic disorder [episodic paroxysmal anxiety] without agoraphobia: Secondary | ICD-10-CM | POA: Diagnosis present

## 2022-05-10 DIAGNOSIS — F32A Depression, unspecified: Secondary | ICD-10-CM | POA: Diagnosis present

## 2022-05-10 DIAGNOSIS — F411 Generalized anxiety disorder: Secondary | ICD-10-CM

## 2022-05-10 DIAGNOSIS — Z9981 Dependence on supplemental oxygen: Secondary | ICD-10-CM | POA: Diagnosis not present

## 2022-05-10 DIAGNOSIS — Z1152 Encounter for screening for COVID-19: Secondary | ICD-10-CM | POA: Diagnosis not present

## 2022-05-10 DIAGNOSIS — Q2112 Patent foramen ovale: Secondary | ICD-10-CM | POA: Diagnosis not present

## 2022-05-10 DIAGNOSIS — J019 Acute sinusitis, unspecified: Secondary | ICD-10-CM | POA: Diagnosis present

## 2022-05-10 DIAGNOSIS — R519 Headache, unspecified: Secondary | ICD-10-CM | POA: Diagnosis present

## 2022-05-10 DIAGNOSIS — K5900 Constipation, unspecified: Secondary | ICD-10-CM | POA: Diagnosis not present

## 2022-05-10 DIAGNOSIS — J441 Chronic obstructive pulmonary disease with (acute) exacerbation: Secondary | ICD-10-CM | POA: Diagnosis present

## 2022-05-10 DIAGNOSIS — R042 Hemoptysis: Secondary | ICD-10-CM | POA: Diagnosis not present

## 2022-05-10 DIAGNOSIS — D638 Anemia in other chronic diseases classified elsewhere: Secondary | ICD-10-CM | POA: Diagnosis present

## 2022-05-10 DIAGNOSIS — G40909 Epilepsy, unspecified, not intractable, without status epilepticus: Secondary | ICD-10-CM | POA: Diagnosis present

## 2022-05-10 DIAGNOSIS — M1990 Unspecified osteoarthritis, unspecified site: Secondary | ICD-10-CM | POA: Diagnosis present

## 2022-05-10 DIAGNOSIS — J9622 Acute and chronic respiratory failure with hypercapnia: Secondary | ICD-10-CM | POA: Diagnosis present

## 2022-05-10 DIAGNOSIS — R0602 Shortness of breath: Secondary | ICD-10-CM | POA: Diagnosis not present

## 2022-05-10 DIAGNOSIS — M797 Fibromyalgia: Secondary | ICD-10-CM | POA: Diagnosis present

## 2022-05-10 DIAGNOSIS — Z7189 Other specified counseling: Secondary | ICD-10-CM | POA: Diagnosis not present

## 2022-05-10 LAB — VITAMIN B12: Vitamin B-12: 490 pg/mL (ref 180–914)

## 2022-05-10 LAB — RESPIRATORY PANEL BY PCR

## 2022-05-10 LAB — URINALYSIS, COMPLETE (UACMP) WITH MICROSCOPIC
Bacteria, UA: NONE SEEN
Bilirubin Urine: NEGATIVE
Glucose, UA: NEGATIVE mg/dL
Hgb urine dipstick: NEGATIVE
Ketones, ur: 5 mg/dL — AB
Leukocytes,Ua: NEGATIVE
Nitrite: NEGATIVE
Protein, ur: 30 mg/dL — AB
Specific Gravity, Urine: >1.046 — ABNORMAL HIGH (ref 1.005–1.030)
pH: 5 (ref 5.0–8.0)

## 2022-05-10 LAB — CBC WITH DIFFERENTIAL/PLATELET
Abs Immature Granulocytes: 0.02 10*3/uL (ref 0.00–0.07)
Basophils Absolute: 0 10*3/uL (ref 0.0–0.1)
Basophils Relative: 0 %
Eosinophils Absolute: 0 10*3/uL (ref 0.0–0.5)
Eosinophils Relative: 0 %
HCT: 31.6 % — ABNORMAL LOW (ref 36.0–46.0)
Hemoglobin: 10.1 g/dL — ABNORMAL LOW (ref 12.0–15.0)
Immature Granulocytes: 0 %
Lymphocytes Relative: 7 %
Lymphs Abs: 0.5 10*3/uL — ABNORMAL LOW (ref 0.7–4.0)
MCH: 28.7 pg (ref 26.0–34.0)
MCHC: 32 g/dL (ref 30.0–36.0)
MCV: 89.8 fL (ref 80.0–100.0)
Monocytes Absolute: 0 10*3/uL — ABNORMAL LOW (ref 0.1–1.0)
Monocytes Relative: 0 %
Neutro Abs: 6.1 10*3/uL (ref 1.7–7.7)
Neutrophils Relative %: 93 %
Platelets: 186 10*3/uL (ref 150–400)
RBC: 3.52 MIL/uL — ABNORMAL LOW (ref 3.87–5.11)
RDW: 14.5 % (ref 11.5–15.5)
WBC: 6.7 10*3/uL (ref 4.0–10.5)
nRBC: 0 % (ref 0.0–0.2)

## 2022-05-10 LAB — BLOOD GAS, VENOUS
Acid-Base Excess: 4.1 mmol/L — ABNORMAL HIGH (ref 0.0–2.0)
Bicarbonate: 28.5 mmol/L — ABNORMAL HIGH (ref 20.0–28.0)
O2 Saturation: 98.6 %
Patient temperature: 36.7
pCO2, Ven: 40 mmHg — ABNORMAL LOW (ref 44–60)
pH, Ven: 7.45 — ABNORMAL HIGH (ref 7.25–7.43)
pO2, Ven: 107 mmHg — ABNORMAL HIGH (ref 32–45)

## 2022-05-10 LAB — GLUCOSE, CAPILLARY
Glucose-Capillary: 182 mg/dL — ABNORMAL HIGH (ref 70–99)
Glucose-Capillary: 100 mg/dL — ABNORMAL HIGH (ref 70–99)
Glucose-Capillary: 127 mg/dL — ABNORMAL HIGH (ref 70–99)

## 2022-05-10 LAB — COMPREHENSIVE METABOLIC PANEL
ALT: 13 U/L (ref 0–44)
AST: 18 U/L (ref 15–41)
Albumin: 3.5 g/dL (ref 3.5–5.0)
Alkaline Phosphatase: 58 U/L (ref 38–126)
Anion gap: 9 (ref 5–15)
BUN: 21 mg/dL (ref 8–23)
CO2: 24 mmol/L (ref 22–32)
Calcium: 8.9 mg/dL (ref 8.9–10.3)
Chloride: 99 mmol/L (ref 98–111)
Creatinine, Ser: 0.66 mg/dL (ref 0.44–1.00)
GFR, Estimated: >60 mL/min (ref >60)
Glucose, Bld: 157 mg/dL — ABNORMAL HIGH (ref 70–99)
Potassium: 4.2 mmol/L (ref 3.5–5.1)
Sodium: 132 mmol/L — ABNORMAL LOW (ref 135–145)
Total Bilirubin: 0.5 mg/dL (ref 0.3–1.2)
Total Protein: 6.8 g/dL (ref 6.5–8.1)

## 2022-05-10 LAB — MRSA NEXT GEN BY PCR, NASAL: MRSA by PCR Next Gen: NOT DETECTED

## 2022-05-10 LAB — PROCALCITONIN: Procalcitonin: <0.1 ng/mL

## 2022-05-10 LAB — FERRITIN: Ferritin: 16 ng/mL (ref 11–307)

## 2022-05-10 LAB — IRON AND TIBC
Iron: 22 ug/dL — ABNORMAL LOW (ref 28–170)
Saturation Ratios: 5 % — ABNORMAL LOW (ref 10.4–31.8)
TIBC: 408 ug/dL (ref 250–450)
UIBC: 386 ug/dL

## 2022-05-10 LAB — HEMOGLOBIN A1C
Hgb A1c MFr Bld: 5.3 % (ref 4.8–5.6)
Mean Plasma Glucose: 105.41 mg/dL

## 2022-05-10 LAB — MAGNESIUM: Magnesium: 1.8 mg/dL (ref 1.7–2.4)

## 2022-05-10 LAB — PHOSPHORUS: Phosphorus: 3.9 mg/dL (ref 2.5–4.6)

## 2022-05-10 LAB — TROPONIN I (HIGH SENSITIVITY)
Troponin I (High Sensitivity): 13 ng/L (ref <18)
Troponin I (High Sensitivity): 6 ng/L (ref <18)

## 2022-05-10 LAB — FOLATE: Folate: 26.4 ng/mL (ref >5.9)

## 2022-05-10 MED ORDER — SERTRALINE HCL 50 MG PO TABS
100.0000 mg | ORAL_TABLET | Freq: Every day | ORAL | Status: DC
  Administered 2022-05-10: 100 mg via ORAL
  Filled 2022-05-10: qty 2

## 2022-05-10 MED ORDER — ALBUTEROL SULFATE (2.5 MG/3ML) 0.083% IN NEBU: 2.5000 mg | INHALATION_SOLUTION | RESPIRATORY_TRACT | Status: DC | PRN

## 2022-05-10 MED ORDER — LEVOTHYROXINE SODIUM 75 MCG PO TABS
175.0000 ug | ORAL_TABLET | Freq: Every day | ORAL | Status: DC
  Administered 2022-05-11 – 2022-05-24 (×14): 175 ug via ORAL
  Filled 2022-05-10: qty 4
  Filled 2022-05-10 (×14): qty 1

## 2022-05-10 MED ORDER — ALBUTEROL SULFATE (2.5 MG/3ML) 0.083% IN NEBU
2.5000 mg | INHALATION_SOLUTION | RESPIRATORY_TRACT | Status: DC | PRN
  Administered 2022-05-12 – 2022-05-13 (×3): 2.5 mg via RESPIRATORY_TRACT
  Filled 2022-05-10 (×3): qty 3

## 2022-05-10 MED ORDER — CHLORHEXIDINE GLUCONATE CLOTH 2 % EX PADS
6.0000 | MEDICATED_PAD | Freq: Every day | CUTANEOUS | Status: DC
  Administered 2022-05-10 – 2022-05-23 (×14): 6 via TOPICAL

## 2022-05-10 MED ORDER — LORAZEPAM 2 MG/ML IJ SOLN
0.5000 mg | Freq: Four times a day (QID) | INTRAMUSCULAR | Status: DC | PRN
  Administered 2022-05-10 (×4): 0.5 mg via INTRAVENOUS
  Filled 2022-05-10 (×4): qty 1

## 2022-05-10 MED ORDER — TRAZODONE HCL 50 MG PO TABS
100.0000 mg | ORAL_TABLET | Freq: Every evening | ORAL | Status: DC | PRN
  Administered 2022-05-10 – 2022-05-15 (×4): 100 mg via ORAL
  Filled 2022-05-10 (×6): qty 2

## 2022-05-10 MED ORDER — PANTOPRAZOLE SODIUM 40 MG PO TBEC
40.0000 mg | DELAYED_RELEASE_TABLET | Freq: Two times a day (BID) | ORAL | Status: DC
  Administered 2022-05-10 – 2022-05-18 (×17): 40 mg via ORAL
  Filled 2022-05-10 (×17): qty 1

## 2022-05-10 MED ORDER — METHYLPREDNISOLONE SODIUM SUCC 125 MG IJ SOLR
80.0000 mg | Freq: Two times a day (BID) | INTRAMUSCULAR | Status: DC
  Administered 2022-05-10 – 2022-05-21 (×23): 80 mg via INTRAVENOUS
  Filled 2022-05-10 (×23): qty 2

## 2022-05-10 MED ORDER — MORPHINE SULFATE ER 15 MG PO TBCR
15.0000 mg | EXTENDED_RELEASE_TABLET | Freq: Two times a day (BID) | ORAL | Status: DC
  Administered 2022-05-10 – 2022-05-24 (×29): 15 mg via ORAL
  Filled 2022-05-10 (×29): qty 1

## 2022-05-10 MED ORDER — LACTATED RINGERS IV BOLUS
500.0000 mL | Freq: Once | INTRAVENOUS | Status: AC
  Administered 2022-05-10: 500 mL via INTRAVENOUS

## 2022-05-10 MED ORDER — ALPRAZOLAM 0.5 MG PO TABS: 0.5000 mg | ORAL_TABLET | Freq: Two times a day (BID) | ORAL | Status: DC

## 2022-05-10 MED ORDER — ARFORMOTEROL TARTRATE 15 MCG/2ML IN NEBU
15.0000 ug | INHALATION_SOLUTION | Freq: Two times a day (BID) | RESPIRATORY_TRACT | Status: DC
  Administered 2022-05-10 – 2022-05-24 (×29): 15 ug via RESPIRATORY_TRACT
  Filled 2022-05-10 (×30): qty 2

## 2022-05-10 MED ORDER — ROSUVASTATIN CALCIUM 20 MG PO TABS
20.0000 mg | ORAL_TABLET | Freq: Every day | ORAL | Status: DC
  Administered 2022-05-10 – 2022-05-24 (×15): 20 mg via ORAL
  Filled 2022-05-10 (×15): qty 1

## 2022-05-10 MED ORDER — FLUTICASONE PROPIONATE 50 MCG/ACT NA SUSP
2.0000 | Freq: Every day | NASAL | Status: DC
  Administered 2022-05-10 – 2022-05-24 (×14): 2 via NASAL
  Filled 2022-05-10: qty 16

## 2022-05-10 MED ORDER — MORPHINE SULFATE (PF) 2 MG/ML IV SOLN
1.0000 mg | Freq: Once | INTRAVENOUS | Status: AC
  Administered 2022-05-10: 1 mg via INTRAVENOUS
  Filled 2022-05-10: qty 1

## 2022-05-10 MED ORDER — ACETAMINOPHEN 325 MG PO TABS
650.0000 mg | ORAL_TABLET | Freq: Four times a day (QID) | ORAL | Status: DC | PRN
  Administered 2022-05-15 – 2022-05-19 (×4): 650 mg via ORAL
  Filled 2022-05-10 (×4): qty 2

## 2022-05-10 MED ORDER — ASPIRIN 81 MG PO TBEC
81.0000 mg | DELAYED_RELEASE_TABLET | Freq: Every day | ORAL | Status: DC
  Administered 2022-05-10 – 2022-05-24 (×15): 81 mg via ORAL
  Filled 2022-05-10 (×15): qty 1

## 2022-05-10 MED ORDER — IPRATROPIUM-ALBUTEROL 0.5-2.5 (3) MG/3ML IN SOLN
3.0000 mL | Freq: Four times a day (QID) | RESPIRATORY_TRACT | Status: DC
  Administered 2022-05-10 (×2): 3 mL via RESPIRATORY_TRACT
  Filled 2022-05-10 (×3): qty 3

## 2022-05-10 MED ORDER — ACETAMINOPHEN 650 MG RE SUPP: 650.0000 mg | Freq: Four times a day (QID) | RECTAL | Status: DC | PRN

## 2022-05-10 MED ORDER — REVEFENACIN 175 MCG/3ML IN SOLN
175.0000 ug | Freq: Every day | RESPIRATORY_TRACT | Status: DC
  Administered 2022-05-10 – 2022-05-24 (×15): 175 ug via RESPIRATORY_TRACT
  Filled 2022-05-10 (×14): qty 3

## 2022-05-10 MED ORDER — ALPRAZOLAM 0.5 MG PO TABS
0.5000 mg | ORAL_TABLET | Freq: Three times a day (TID) | ORAL | Status: DC
  Administered 2022-05-10 – 2022-05-24 (×42): 0.5 mg via ORAL
  Filled 2022-05-10 (×43): qty 1

## 2022-05-10 MED ORDER — SERTRALINE HCL 50 MG PO TABS
100.0000 mg | ORAL_TABLET | Freq: Every day | ORAL | Status: DC
  Administered 2022-05-11 – 2022-05-23 (×13): 100 mg via ORAL
  Filled 2022-05-10 (×13): qty 2

## 2022-05-10 MED ORDER — MELATONIN 3 MG PO TABS
3.0000 mg | ORAL_TABLET | Freq: Every evening | ORAL | Status: DC | PRN
  Administered 2022-05-19 – 2022-05-22 (×3): 3 mg via ORAL
  Filled 2022-05-10 (×3): qty 1

## 2022-05-10 MED ORDER — GUAIFENESIN ER 600 MG PO TB12
1200.0000 mg | ORAL_TABLET | Freq: Two times a day (BID) | ORAL | Status: DC
  Administered 2022-05-10 – 2022-05-24 (×29): 1200 mg via ORAL
  Filled 2022-05-10 (×29): qty 2

## 2022-05-10 MED ORDER — INSULIN ASPART 100 UNIT/ML IJ SOLN
0.0000 [IU] | Freq: Three times a day (TID) | INTRAMUSCULAR | Status: DC
  Administered 2022-05-10 – 2022-05-19 (×12): 1 [IU] via SUBCUTANEOUS
  Administered 2022-05-20: 2 [IU] via SUBCUTANEOUS
  Administered 2022-05-20 – 2022-05-23 (×4): 1 [IU] via SUBCUTANEOUS

## 2022-05-10 MED ORDER — LACTATED RINGERS IV BOLUS
1000.0000 mL | Freq: Once | INTRAVENOUS | Status: AC
  Administered 2022-05-10: 1000 mL via INTRAVENOUS

## 2022-05-10 MED ORDER — HYDROMORPHONE HCL 4 MG PO TABS
4.0000 mg | ORAL_TABLET | Freq: Four times a day (QID) | ORAL | Status: DC | PRN
  Administered 2022-05-10 – 2022-05-23 (×25): 4 mg via ORAL
  Filled 2022-05-10 (×28): qty 1

## 2022-05-10 MED ORDER — LORAZEPAM 2 MG/ML IJ SOLN
0.5000 mg | INTRAMUSCULAR | Status: DC | PRN
  Administered 2022-05-12 – 2022-05-21 (×15): 0.5 mg via INTRAVENOUS
  Filled 2022-05-10 (×16): qty 1

## 2022-05-10 MED ORDER — BUDESONIDE 0.5 MG/2ML IN SUSP
0.5000 mg | Freq: Two times a day (BID) | RESPIRATORY_TRACT | Status: DC
  Administered 2022-05-10 – 2022-05-24 (×29): 0.5 mg via RESPIRATORY_TRACT
  Filled 2022-05-10 (×30): qty 2

## 2022-05-10 NOTE — Progress Notes (Signed)
  Transition of Care [facility]) Screening Note   Patient Details  Name: [name] Fitzpatrick Date of Birth: 1955-07-30   Transition of Care [facility]) CM/SW Contact:    Ihor Gully, LCSW Phone Number: 05/10/2022, 11:31 AM    Transition of Care Department [facility]) has reviewed patient and no TOC needs have been identified at this time. We will continue to monitor patient advancement through interdisciplinary progression rounds. If new patient transition needs arise, please place a TOC consult.

## 2022-05-10 NOTE — H&P (Signed)
History and Physical      [name] Fitzpatrick [id number] DOB: 08-Apr-1955 DOA: 05/09/2022  PCP: Joya Gaskins, FNP  Patient coming from: home   I have personally briefly reviewed patient's old medical records in [place]  Chief Complaint: sob  HPI: [name] Fitzpatrick is a 67 y.o. female with medical history significant for chronic hypoxic respiratory failure on 6 L continuous nasal cannula, pulmonary fibrosis, type 2 diabetes mellitus, acquired hypothyroidism, anemia of chronic disease associated baseline hemoglobin 9-11, who is admitted to [facility] on 05/09/2022 with acute on chronic hypoxic respiratory failure after presenting from home to [facility] ED complaining of shortness of breath.   The patient reports 2 days of progressive shortness of breath associated with worsening nonproductive cough.  Denies any associated orthopnea, PND, or worsening peripheral edema.  No associate with any subjective fever, chills, rigors, or generalized myalgias.  No associated any palpitations, diaphoresis, dizziness, presyncope, or syncope.  No recent trauma or travel.  Denies any recent new onset calf tenderness or new lower extremity erythema.  Medical history notable for chronic hypoxic respiratory failure on baseline 6 L continuous nasal cannula in the setting of pulmonary fibrosis.  Reviewed that she is a former smoker, having completely quit smoking approximately 10 years ago after nearly 4-year history of smoking.  Per chart review, no prior echocardiogram on file.     ED Course:  Vital signs in the ED were notable for the following: afebrile; heart rate 0000000; systolic blood pressures in the 90s to 130s; respiratory rate 19-24, initial oxygen saturation 88% on her baseline 6 L nasal cannula, subsequently increasing into the range of 96 to 100% on BiPAP.  Labs were notable for the following: CMP notable for the following: Sodium 134, troponin 26, BUN 20, creatinine 0.74  compared to most recent prior serum creatinine to point to 0.60 in May 2023, BUN to creatinine ratio greater than 20, liver enzymes within normal limits.  BNP 274 compared to most recent prior value of 337 in April 2022.  High-sensitivity troponin I initially 11, 3 value trending up slightly to 13.  CBC notable for white blood cell count 11,800, hemoglobin 10.2 compared to most recent prior value of 9.3 in May 2022.  TSH 0.445.  COVID-19, RSV, and influenza PCR were all negative.  Viral respiratory panel currently pending.  Per my interpretation, EKG in ED demonstrated the following: Sinus rhythm with incomplete right bundle branch block, and no evidence of T wave or ST changes, including no evidence of ST elevation.  Imaging and additional notable ED work-up: CTA chest, per formal radiology read, and in comparison to CT chest from April 2022 shows no evidence of acute pulmonary embolism nor any evidence of aortic aneurysm or dissection, while showing chronic fibrotic changes in the bilateral lungs, similar appearance to most recent prior CT chest, and demonstrates no evidence of acute cardiopulmonary process, including no evidence of infiltrate, edema, effusion, or pneumothorax.  Additionally, CTA of the abdomen/pelvis, per formal radiology read, shows no evidence of acute intra-abdominal or acute intrapelvic process.  While in the ED, the following were administered: Solu-Medrol 80 mg IV x 1, Ativan 0.5 mg IV x 1, 2 nebulizer treatment x 1, fentanyl 50 mcg IV x 2, Toradol 15 mg IV x 1.  Subsequently, the patient was admitted for further evaluation management acute on chronic hypoxic respiratory failure in the setting of suspected acute pulmonary fibrosis exacerbation.     Review of Systems: As per  HPI otherwise 10 point review of systems negative.   Past Medical History:  Diagnosis Date   Acute deep vein thrombosis (DVT) of left femoral vein (HCC)    Acute on chronic respiratory failure with  hypoxia (HCC)    Anxiety    ARDS (adult respiratory distress syndrome) (HCC)    Chronic LBP    Chronic neck pain    COPD (chronic obstructive pulmonary disease) (HCC)    Depression    DM type 2 (diabetes mellitus, type 2) (HCC)    Dyspnea 05/25/2020   Fibromyalgia    GERD (gastroesophageal reflux disease)    H/O pulmonary fibrosis    H/O suicide attempt    Hashimoto's thyroiditis    Hypertension    Hypothyroidism    IBS (irritable bowel syndrome)    with constipation   Interstitial pneumonia (HCC)    Migraine headache    Osteoarthritis    Seizure disorder ([place])    Thyroiditis, 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   had total thyroidectomy 11/30/2013 at [facility]    Past Surgical History:  Procedure Laterality Date   ABDOMINAL HYSTERECTOMY     b/l foot surgery --bunions     BIOPSY  07/29/2015   Procedure: BIOPSY;  Surgeon: Rogene Houston, MD;  Location: [facility];  Service: Endoscopy;;  Duodenal,  gastric, and esophageal  biopsies   C-Spine fusion  1991, 2004   CHOLECYSTECTOMY     COLONOSCOPY WITH PROPOFOL N/A 07/29/2015   Procedure: COLONOSCOPY WITH PROPOFOL;  Surgeon: Rogene Houston, MD;  Location: [facility];  Service: Endoscopy;  Laterality: N/A;  8:30   ESOPHAGEAL DILATION N/A 07/29/2015   Procedure: ESOPHAGEAL DILATION;  Surgeon: Rogene Houston, MD;  Location: [facility];  Service: Endoscopy;  Laterality: N/A;   ESOPHAGOGASTRODUODENOSCOPY (EGD) WITH PROPOFOL N/A 07/29/2015   Procedure: ESOPHAGOGASTRODUODENOSCOPY (EGD) WITH PROPOFOL;  Surgeon: Rogene Houston, MD;  Location: [facility];  Service: Endoscopy;  Laterality: N/A;   l carpal tunnel release Right    l spine fusion x 2     reconstucted right ankle     Right knee arthroscopic surgery     total reconstruction of right ankle and heel     total of three    VESICOVAGINAL FISTULA CLOSURE W/ TAH  1993   Fibroids no Ca    Social History:  reports that she quit smoking about 9 years ago. Her smoking use included  cigarettes. She has a 12.00 pack-year smoking history. She has never used smokeless tobacco. She reports current drug use. She reports that she does not drink alcohol.   Allergies  Allergen Reactions   Levofloxacin Shortness Of Breath and Itching    WHELPS WHELPS WHELPS WHELPS    Penicillins Other (See Comments)    Stroke-like symptoms Has patient had a PCN reaction causing immediate rash, facial/tongue/throat swelling, SOB or lightheadedness with hypotension: YES Has patient had a PCN reaction causing severe rash involving mucus membranes or skin necrosis: No Has patient had a PCN reaction that required hospitalization: No Has patient had a PCN reaction occurring within the last 10 years: Yes If all of the above answers are "NO", then may proceed with Cephalosporin use.    Prednisone Shortness Of Breath and Nausea And Vomiting   Cephalexin Hives, Swelling and Rash   Latex Rash and Other (See Comments)    Causes skin to tear easily   Metoclopramide Hcl Hives and Rash    Family History  Problem Relation Age of Onset  Cancer Father        pancreatic    Pancreatic cancer Father    Heart failure Mother    GI problems Mother    Heart disease Mother    Diabetes Mother    Thyroid disease Sister    Cancer Maternal Grandfather    Cancer Other        family history     Family history reviewed and not pertinent    Prior to Admission medications   Medication Sig Start Date End Date Taking? Authorizing Provider  acetaminophen (TYLENOL) 500 MG tablet Take 1,000 mg by mouth every 6 (six) hours as needed.    [provider]  alendronate (FOSAMAX) 70 MG tablet Take 70 mg by mouth once a week. 04/18/20   [provider]  aspirin EC 81 MG tablet Take 81 mg by mouth daily.    [provider]  budesonide (PULMICORT) 0.25 MG/2ML nebulizer solution Take 2 mLs (0.25 mg total) by nebulization 2 (two) times daily. 07/19/20   Shahmehdi, Valeria Batman, MD  dicyclomine  (BENTYL) 10 MG capsule TAKE 1 CAPSULE BY MOUTH TWICE DAILY. 09/03/14   Fayrene Helper, MD  fluticasone [facility]) 50 MCG/ACT nasal spray PLACE 2 SPRAYS INTO BOTH NOSTRILS DAILY 04/11/16   Fayrene Helper, MD  gabapentin (NEURONTIN) 300 MG capsule Take 1 capsule (300 mg total) by mouth 3 (three) times daily. 07/19/20 08/18/20  Shahmehdi, Valeria Batman, MD  ipratropium-albuterol (DUONEB) 0.5-2.5 (3) MG/3ML SOLN Take 3 mLs by nebulization every 6 (six) hours as needed. 05/31/20   Elgergawy, Silver Huguenin, MD  levothyroxine (SYNTHROID) 175 MCG tablet Take 175 mcg by mouth daily. 03/31/20   [provider]  naloxone [facility]) nasal spray 4 mg/0.1 mL  06/01/20   [provider]  pantoprazole (PROTONIX) 40 MG tablet Take 40 mg by mouth 2 (two) times daily. 05/20/20   [provider]  promethazine (PHENERGAN) 25 MG tablet Take 1 tablet (25 mg total) by mouth daily as needed for nausea or vomiting. 07/07/15   Fayrene Helper, MD  sertraline (ZOLOFT) 100 MG tablet Take 100 mg by mouth in the morning and at bedtime.    [provider]  traZODone (DESYREL) 100 MG tablet Take 100-200 mg by mouth at bedtime as needed for sleep.    [provider]     Objective    Physical Exam: Vitals:   05/09/22 2347 05/09/22 2356 05/09/22 2357 05/10/22 0000  BP:   (!) 84/53 (!) 88/61  Pulse: 91  94 85  Resp:   19   Temp:      TempSrc:      SpO2: 94% 96% 96% 100%  Weight:      Height:        General: appears to be stated age; alert, oriented; on BiPAP; mildly increased work of breathing noted Skin: warm, dry, no rash Head:  AT/[place] Mouth:  Oral mucosa membranes appear moist, normal dentition Neck: supple; trachea midline Heart:  RRR; did not appreciate any M/R/G Lungs: CTAB, did not appreciate any wheezes, rales, or rhonchi Abdomen: + BS; soft, ND, NT Vascular: 2+ pedal pulses b/l; 2+ radial pulses b/l Extremities: no peripheral edema, no muscle wasting Neuro: strength and  sensation intact in upper and lower extremities b/l     Labs on Admission: I have personally reviewed following labs and imaging studies  CBC: Recent Labs  Lab 05/09/22 2201  WBC 11.8*  NEUTROABS 9.5*  HGB 10.2*  HCT 32.6*  MCV 89.3  PLT [id number]   Basic Metabolic Panel: Recent Labs  Lab 05/09/22 2201  NA 134*  K 4.1  CL 98  CO2 26  GLUCOSE 110*  BUN 20  CREATININE 0.74  CALCIUM 9.1  MG 1.8   GFR: Estimated Creatinine Clearance: 54.7 mL/min (by C-G formula based on SCr of 0.74 mg/dL). Liver Function Tests: Recent Labs  Lab 05/09/22 2201  AST 18  ALT 13  ALKPHOS 57  BILITOT 0.4  PROT 6.7  ALBUMIN 3.6   Recent Labs  Lab 05/09/22 2201  LIPASE 25   No results for input(s): "AMMONIA" in the last 168 hours. Coagulation Profile: Recent Labs  Lab 05/09/22 2201  INR 1.0   Cardiac Enzymes: No results for input(s): "CKTOTAL", "CKMB", "CKMBINDEX", "TROPONINI" in the last 168 hours. BNP (last 3 results) No results for input(s): "PROBNP" in the last 8760 hours. HbA1C: No results for input(s): "HGBA1C" in the last 72 hours. CBG: No results for input(s): "GLUCAP" in the last 168 hours. Lipid Profile: No results for input(s): "CHOL", "HDL", "LDLCALC", "TRIG", "CHOLHDL", "LDLDIRECT" in the last 72 hours. Thyroid Function Tests: Recent Labs    05/09/22 2201  TSH 0.445   Anemia Panel: No results for input(s): "VITAMINB12", "FOLATE", "FERRITIN", "TIBC", "IRON", "RETICCTPCT" in the last 72 hours. Urine analysis:    Component Value Date/Time   COLORURINE STRAW (A) 07/15/2020 [place] 07/15/2020 0357   LABSPEC 1.010 07/15/2020 0357   PHURINE 5.5 07/15/2020 0357   GLUCOSEU NEGATIVE 07/15/2020 0357   HGBUR NEGATIVE 07/15/2020 0357   HGBUR negative 05/17/2006 0000   BILIRUBINUR NEGATIVE 07/15/2020 0357   BILIRUBINUR small 03/30/2015 1546   KETONESUR NEGATIVE 07/15/2020 0357   PROTEINUR TRACE (A) 07/15/2020 0357   UROBILINOGEN 0.2 03/30/2015  1546   UROBILINOGEN 0.2 11/08/2013 0237   NITRITE NEGATIVE 07/15/2020 0357   LEUKOCYTESUR NEGATIVE 07/15/2020 0357    Radiological Exams on Admission: CT Angio Chest/Abd/Pel for Dissection W and/or Wo Contrast  Result Date: 05/09/2022 CLINICAL DATA:  Chest pain radiating into the neck, initial encounter EXAM: CT ANGIOGRAPHY CHEST, ABDOMEN AND PELVIS TECHNIQUE: Non-contrast CT of the chest was initially obtained. Multidetector CT imaging through the chest, abdomen and pelvis was performed using the standard protocol during bolus administration of intravenous contrast. Multiplanar reconstructed images and MIPs were obtained and reviewed to evaluate the vascular anatomy. RADIATION DOSE REDUCTION: This exam was performed according to the departmental dose-optimization program which includes automated exposure control, adjustment of the mA and/or kV according to patient size and/or use of iterative reconstruction technique. CONTRAST:  19m OMNIPAQUE IOHEXOL 350 MG/ML SOLN COMPARISON:  07/15/2020 FINDINGS: CTA CHEST FINDINGS Cardiovascular: Initial precontrast images demonstrate atherosclerotic calcification of the thoracic aorta. No aneurysmal dilatation is seen. No hyperdense crescent to suggest acute aortic injury is noted. Postcontrast images were then obtained and reveal no evidence of aortic dissection. Heart is not significantly enlarged. The pulmonary artery shows a normal branching pattern bilaterally. No filling defect to suggest pulmonary embolism is noted. Coronary calcifications are noted. Mediastinum/Nodes: Thoracic inlet is within normal limits. Prominent AP window lymph node has resolved in the interval from the prior exam. Persistent right hilar lymph nodes are seen likely reactive in nature. The esophagus as visualized is within normal limits. Lungs/Pleura: Lungs demonstrate diffuse fibrotic changes and honeycombing particularly in the bases. The previously seen diffuse superimposed infiltrates  have resolved. There is a 4 mm nodule in the right upper lobe on image number 26 of series 9. No  other sizable nodules are seen. No effusions are noted. Musculoskeletal: Degenerative changes of the thoracic spine are noted. No acute rib abnormality is seen. Chronic compression deformities of T5 and T7 are noted. Review of the MIP images confirms the above findings. CTA ABDOMEN AND PELVIS FINDINGS VASCULAR Aorta: Atherosclerotic calcifications are noted without aneurysmal dilatation or dissection. Celiac: Patent without evidence of aneurysm, dissection, vasculitis or significant stenosis. SMA: Patent without evidence of aneurysm, dissection, vasculitis or significant stenosis. Renals: Both renal arteries are patent without evidence of aneurysm, dissection, vasculitis, fibromuscular dysplasia or significant stenosis. IMA: Patent without evidence of aneurysm, dissection, vasculitis or significant stenosis. Inflow: Iliacs demonstrate mild atherosclerotic calcification although no focal stenosis is seen. Veins: Visualized venous structures appear within normal limits. IVC filter is noted in satisfactory position. Review of the MIP images confirms the above findings. NON-VASCULAR Hepatobiliary: Gallbladder has been surgically removed. Liver shows no focal abnormality. Pancreas: Unremarkable. No pancreatic ductal dilatation or surrounding inflammatory changes. Spleen: Normal in size without focal abnormality. Adrenals/Urinary Tract: Adrenal glands are within normal limits. Kidneys demonstrate a normal enhancement pattern bilaterally. No renal calculi or obstructive changes are seen. The bladder is partially distended. Stomach/Bowel: Fecal material is noted scattered throughout the colon. No obstructive or inflammatory changes are seen. The appendix is not well visualized and may have been surgically. No inflammatory changes are seen. The small bowel and stomach are unremarkable. Lymphatic: No lymphadenopathy is noted.  Reproductive: Status post hysterectomy. No adnexal masses. Other: No abdominal wall hernia or abnormality. No abdominopelvic ascites. Musculoskeletal: Postsurgical changes are noted in the lower lumbar spine. Prior vertebral augmentation at L1 is seen. Chronic L3 compression fracture is noted. Review of the MIP images confirms the above findings. IMPRESSION: CTA of the chest: No evidence of aortic injury. No pulmonary embolism is seen. Persistent right hilar lymph nodes likely reactive in nature. Chronic fibrotic changes are noted in the lungs bilaterally similar to prior exam. 4 mm right solid pulmonary nodule within the upper lobe. Per Fleischner Society Guidelines, a non-contrast Chest CT at 12 months is optional. If performed and the nodule is stable at 12 months, no further follow-up is recommended. These guidelines do not apply to immunocompromised patients and patients with cancer. Follow up in patients with significant comorbidities as clinically warranted. For lung cancer screening, adhere to Lung-RADS guidelines. Reference: Radiology. 2017; 284(1):228-43. CTA of the abdomen and pelvis: No aortic abnormality is noted. No acute abnormality is seen. Electronically Signed   By: Inez Catalina M.D.   On: 05/09/2022 23:40   CT Head Wo Contrast  Result Date: 05/09/2022 CLINICAL DATA:  New onset headache EXAM: CT HEAD WITHOUT CONTRAST TECHNIQUE: Contiguous axial images were obtained from the base of the skull through the vertex without intravenous contrast. RADIATION DOSE REDUCTION: This exam was performed according to the departmental dose-optimization program which includes automated exposure control, adjustment of the mA and/or kV according to patient size and/or use of iterative reconstruction technique. COMPARISON:  None Available. FINDINGS: Brain: There is no mass, hemorrhage or extra-axial collection. The size and configuration of the ventricles and extra-axial CSF spaces are normal. The brain parenchyma  is normal, without acute or chronic infarction. Vascular: No abnormal hyperdensity of the major intracranial arteries or dural venous sinuses. No intracranial atherosclerosis. Skull: The visualized skull base, calvarium and extracranial soft tissues are normal. Sinuses/Orbits: No fluid levels or advanced mucosal thickening of the visualized paranasal sinuses. No mastoid or middle ear effusion. The orbits are normal. IMPRESSION: Normal head CT.  Electronically Signed   By: Ulyses Jarred M.D.   On: 05/09/2022 23:22   DG Chest Portable 1 View  Result Date: 05/09/2022 CLINICAL DATA:  Chest pain and shortness of breath EXAM: PORTABLE CHEST 1 VIEW COMPARISON:  07/15/2020 FINDINGS: Cardiac shadow is stable. Diffuse fibrotic changes are identified throughout both lungs. No focal infiltrate or sizable effusion is seen. No bony abnormality is noted. IMPRESSION: Changes consistent with pulmonary fibrosis without acute abnormality. Electronically Signed   By: Inez Catalina M.D.   On: 05/09/2022 22:03      Assessment/Plan   Principal Problem:   Acute on chronic hypoxic respiratory failure (HCC) Active Problems:   Acquired hypothyroidism   Allergic rhinitis   GAD (generalized anxiety disorder)   Acute exacerbation of idiopathic pulmonary fibrosis (HCC)   Leukocytosis   DM2 (diabetes mellitus, type 2) (HCC)   Anemia of chronic disease        #) Acute on chronic hypoxic respiratory failure due to acute exacerbation of pulmonary fibrosis: In the setting of a documented history of chronic hypoxic respiratory failure due to pulmonary fibrosis with baseline supplemental O2 requirement noted to be 6 L continuous nasal cannula, presenting O2 sat note to be in the high 80s on her baseline 6 L nasal cannula, with subsequent initiation of BiPAP yielding improvement in oxygen saturations into the high 90s to 100%, as further detailed above. Appears to be on basis of acute exacerbation of her underlying pulmonary  fibrosis.  With CTA chest showing no evidence of acute cardiopulmonary process, including no evidence of acute pulmonary embolism or any evidence of aortic dissection/aneurysm, nor any evidence of infiltrate, edema, effusion, or pneumothorax.  Rather, CTA chest demonstrates evidence of chronic, unchanged fibrotic changes in the bilateral lungs, as further qualified above.  In terms of other considered etiologies, ACS appears less likely at this time in the context of negative troponin and presenting EKG showing no e/o acute ischemic process. No clinical or radiographic evidence to suggest acutely decompensated heart failure at this time, also noting that presenting BNP has declined in the interval since most recent prior value, as further quantified above.  Additionally, COVID-19/Influenza/RSV PCR are negative.  Differential also includes the possibility of exacerbation of her pulmonary fibrosis on the basis of other acute viral respiratory infection, which prompted checking of our respiratory panel, the result of which is currently pending.  Of note, bacterial pneumonia unlikely in the setting of CTA showed no evidence of infiltrate/acute airspace opacity.  Consequently, we will refrain from checking procalcitonin level at this time.    Plan: Solu-Medrol 80 mg IV twice daily.  Scheduled duo nebulizer treatments.  As needed albuterol nebulizer.  Monitor continuous pulse ox. monitor on telemetry. CMP/CBC in the AM. Check serum Mg and Phos levels.  For now, will continue BiPAP. check blood gas.  Repeat troponin in the morning.  Follow results of viral respiratory panel.                #) Leukocytosis: Presenting CBC reflects mildly elevated white cell count of 11,800.  Suspect inflammatory contribution in the context of presenting acute exacerbation of underlying pulmonary fibrosis.  No overt evidence of underlying infectious process at this time, including CTA chest which showed no evidence of  acute process, including no evidence of infiltrate, We will COVID, influenza, RSV PCR were all negative.  Will follow for results of viral respiratory panel, as above.  Also check urinalysis.  This degree of leukocytosis does not meet quantitative  threshold to be considered a positive SIRS criteria, and in the absence of objective fever, SIRS criteria are not met for sepsis at this time.  It is noted the patient's ensuing white blood cell count may trend up further as a consequence of interval plan for additional systemic corticosteroids as component of management of acute exacerbation of pulmonary fibrosis.  Overall, patient appears hemodynamically stable, and will refrain from initiation of antibiotics at this time.   Plan: Repeat CBC with diff in the morning.  Monitor strict I's and O's, daily weights.  Follow-up results of respiratory viral panel.  Check urinalysis.              #) Type 2 Diabetes Mellitus: documented history of such.  Appears to be managed via lifestyle modifications, in the absence of any insulin or hypoglycemic agents as an outpatient.  Presenting blood sugar noted to be 110, while most recent hemoglobin A1c, per chart review, appears to have been 5.6% in August 2016.  Increased risk for ensuing relative hyperglycemia as consequence of plan for additional cystoscopy corticosteroids as component of management of presenting acute exacerbation of underlying pulmonary fibrosis, as above.   Plan: accuchecks QAC and HS with low dose SSI.  Add on hemoglobin A1c level.              #) Generalized anxiety disorder: documented h/o such. On Zoloft as outpatient.  Presenting EKG demonstrates no evidence of QTc prolongation.  Patient verbalizes and agree of anxiety on existing BiPAP.  Will add prn IV Ativan to attend to this.  Of note, TSH was found to be within normal limits.  Plan: Continue home Zoloft.  Prn IV Ativan, as above.               #)  acquired hypothyroidism: documented h/o such, on Synthroid as outpatient.  TSH when checked in the ED today was found to be within normal limits, as quantified above  Plan: cont home Synthroid.                 #) Allergic Rhinitis: documented h/o such, on scheduled intranasal Flonase as outpatient.    Plan: cont home Flonase.               #) Anemia of chronic disease: Documented history of such, a/w with baseline hgb range 9-11, with presenting hgb consistent with this range, in the absence of any overt evidence of active bleed.     Plan: Repeat CBC in the morning.        DVT prophylaxis: SCD's   Code Status: DNR (consistent with code status documentation at time of most recent prior hospitalizations, and confirmed by patient). Family Communication: none Disposition Plan: Per Rounding Team Consults called: none;  Admission status: inpatient     I SPENT GREATER THAN 75  MINUTES IN CLINICAL CARE TIME/MEDICAL DECISION-MAKING IN COMPLETING THIS ADMISSION.      [place] DO [facility]  From [place]   05/10/2022, 12:42 AM

## 2022-05-10 NOTE — Progress Notes (Signed)
PROGRESS NOTE  [name] Fitzpatrick [id number] DOB: 07-Dec-1955 DOA: 05/09/2022 PCP: Joya Gaskins, FNP  Brief History:  67 year old female with a history of chronic respiratory failure on 6 L, pulmonary fibrosis, diabetes mellitus type 2, anemia of chronic disease, anxiety, hypertension presenting with worsening cough and shortness of breath over the last 2 to 3 days.  The patient states that she has been having some headaches which began on 05/04/2022 followed by some emesis.  She states that her emesis has improved, unfortunately, her shortness of breath and coughing have worsened.  She has had some fevers at home up to 101.2 F.  She denies any hemoptysis, abdominal pain, diarrhea, hematochezia, melena.  She denies any focal extremity weakness or visual loss. She denies any worsening lower extremity edema. In the ED, the patient was afebrile with soft blood pressures in the 90s.  WBC 11.8, hemoglobin 10.2, platelets 222,000. BMP showed sodium 134, potassium 4.1, bicarbonate 26, BUN 20, creatinine 0.74.  LFTs unremarkable.  BNP 274.  CTA chest was negative for PE, negative dissection.  There is diffuse fibrotic changes with honeycombing.  There is a right upper lobe 4 mm nodule.  CTA abdomen pelvis was negative for any acute findings.  EKG shows sinus rhythm with incomplete right bundle branch block.  Troponin 11>>   Assessment/Plan: Acute on chronic respiratory failure with hypoxia -Secondary to exacerbation of pulmonary fibrosis -Suspect underlying viral infection -Chronically on 6 L nasal cannula -On 15 L high flow -05/09/2022 CTA chest negative for PE, consolidation -Pulmonary consult -Continue IV Solu-Medrol -Continue duoneb -Add Pulmicort and Brovana -COVID-19 PCR negative, RSV negative, influenza negative -check PCT  Anxiety disorder -Continue home dose alprazolam, Zoloft  Opioid dependence was -PDMP reviewed--patient receives morphine ER 15 mg, 90  monthly -Patient receives Dilaudid 4 mg, #120 monthly -Patient received Xanax #90, 0.5 mg  Hypothyroidism -Continue Synthroid  Diabetes mellitus type 2 -Appears to be managed by lifestyle modification -NovoLog sliding scale -Check A1c  GOC -initially DNR -verbalized to me in am 05/10/22 she wanted full scope of care -consult palliative to continue discussion       Family Communication:   Family at bedside  Consultants:  palliative, pulm  Code Status:  FULL / DNR  DVT Prophylaxis:  [place] Lovenox   Procedures: As Listed in Progress Note Above  Antibiotics: None    Total time spent 50 minutes.  Greater than 50% spent face to face counseling and coordinating care.     Subjective: Patient complains of shortness of breath and dry cough.  She denies any nausea, vomiting or diarrhea or abdominal pain, fevers, chills presently.  Objective: Vitals:   05/10/22 0545 05/10/22 0630 05/10/22 0645 05/10/22 0715  BP: 102/65 (!) 81/70 97/61 105/84  Pulse: 84 87  86  Resp:    (!) 28  Temp:      TempSrc:      SpO2: 96% 90%  92%  Weight:      Height:       No intake or output data in the 24 hours ending 05/10/22 0808 Weight change:  Exam:  General:  Pt is alert, follows commands appropriately, not in acute distress HEENT: No icterus, No thrush, No neck mass, /AT Cardiovascular: RRR, S1/S2, no rubs, no gallops Respiratory: Scattered bilateral rales.  Bibasilar wheeze.  Good air movement. Abdomen: Soft/+BS, non tender, non distended, no guarding Extremities: No edema, No lymphangitis, No petechiae, No rashes, no synovitis  Data Reviewed: I have personally reviewed following labs and imaging studies Basic Metabolic Panel: Recent Labs  Lab 05/09/22 2201 05/10/22 0550  NA 134* 132*  K 4.1 4.2  CL 98 99  CO2 26 24  GLUCOSE 110* 157*  BUN 20 21  CREATININE 0.74 0.66  CALCIUM 9.1 8.9  MG 1.8 1.8  PHOS  --  3.9   Liver Function Tests: Recent Labs  Lab  05/09/22 2201 05/10/22 0550  AST 18 18  ALT 13 13  ALKPHOS 57 58  BILITOT 0.4 0.5  PROT 6.7 6.8  ALBUMIN 3.6 3.5   Recent Labs  Lab 05/09/22 2201  LIPASE 25   No results for input(s): "AMMONIA" in the last 168 hours. Coagulation Profile: Recent Labs  Lab 05/09/22 2201  INR 1.0   CBC: Recent Labs  Lab 05/09/22 2201 05/10/22 0550  WBC 11.8* 6.7  NEUTROABS 9.5* 6.1  HGB 10.2* 10.1*  HCT 32.6* 31.6*  MCV 89.3 89.8  PLT 222 186   Cardiac Enzymes: No results for input(s): "CKTOTAL", "CKMB", "CKMBINDEX", "TROPONINI" in the last 168 hours. BNP: Invalid input(s): "POCBNP" CBG: No results for input(s): "GLUCAP" in the last 168 hours. HbA1C: Recent Labs    05/09/22 2201  HGBA1C 5.3   Urine analysis:    Component Value Date/Time   COLORURINE STRAW (A) 07/15/2020 [place] 07/15/2020 0357   LABSPEC 1.010 07/15/2020 0357   PHURINE 5.5 07/15/2020 [place] 07/15/2020 0357   HGBUR NEGATIVE 07/15/2020 0357   HGBUR negative 05/17/2006 0000   BILIRUBINUR NEGATIVE 07/15/2020 0357   BILIRUBINUR small 03/30/2015 1546   KETONESUR NEGATIVE 07/15/2020 0357   PROTEINUR TRACE (A) 07/15/2020 0357   UROBILINOGEN 0.2 03/30/2015 1546   UROBILINOGEN 0.2 11/08/2013 0237   NITRITE NEGATIVE 07/15/2020 0357   LEUKOCYTESUR NEGATIVE 07/15/2020 0357   Sepsis Labs: @LABRCNTIP$ (procalcitonin:4,lacticidven:4) ) Recent Results (from the past 240 hour(s))  Resp panel by RT-PCR (RSV, Flu A&B, Covid) Anterior Nasal Swab     Status: None   Collection Time: 05/09/22  9:55 PM   Specimen: Anterior Nasal Swab  Result Value Ref Range Status   SARS Coronavirus 2 by RT PCR NEGATIVE NEGATIVE Final    Comment: (NOTE) SARS-CoV-2 target nucleic acids are NOT DETECTED.  The SARS-CoV-2 RNA is generally detectable in upper respiratory specimens during the acute phase of infection. The lowest concentration of SARS-CoV-2 viral copies this assay can detect is 138  copies/mL. A negative result does not preclude SARS-Cov-2 infection and should not be used as the sole basis for treatment or other patient management decisions. A negative result may occur with  improper specimen collection/handling, submission of specimen other than nasopharyngeal swab, presence of viral mutation(s) within the areas targeted by this assay, and inadequate number of viral copies(<138 copies/mL). A negative result must be combined with clinical observations, patient history, and epidemiological information. The expected result is Negative.  Fact Sheet for Patients:  EntrepreneurPulse.com.au  Fact Sheet for Healthcare Providers:  IncredibleEmployment.be  This test is no t yet approved or cleared by the [place] FDA and  has been authorized for detection and/or diagnosis of SARS-CoV-2 by FDA under an Emergency Use Authorization (EUA). This EUA will remain  in effect (meaning this test can be used) for the duration of the COVID-19 declaration under Section 564(b)(1) of the Act, 21 U.S.C.section 360bbb-3(b)(1), unless the authorization is terminated  or revoked sooner.       Influenza A by PCR NEGATIVE NEGATIVE Final  Influenza B by PCR NEGATIVE NEGATIVE Final    Comment: (NOTE) The Xpert Xpress SARS-CoV-2/FLU/RSV plus assay is intended as an aid in the diagnosis of influenza from Nasopharyngeal swab specimens and should not be used as a sole basis for treatment. Nasal washings and aspirates are unacceptable for Xpert Xpress SARS-CoV-2/FLU/RSV testing.  Fact Sheet for Patients: EntrepreneurPulse.com.au  Fact Sheet for Healthcare Providers: IncredibleEmployment.be  This test is not yet approved or cleared by the [place] FDA and has been authorized for detection and/or diagnosis of SARS-CoV-2 by FDA under an Emergency Use Authorization (EUA). This EUA will remain in effect (meaning  this test can be used) for the duration of the COVID-19 declaration under Section 564(b)(1) of the Act, 21 U.S.C. section 360bbb-3(b)(1), unless the authorization is terminated or revoked.     Resp Syncytial Virus by PCR NEGATIVE NEGATIVE Final    Comment: (NOTE) Fact Sheet for Patients: EntrepreneurPulse.com.au  Fact Sheet for Healthcare Providers: IncredibleEmployment.be  This test is not yet approved or cleared by the [place] FDA and has been authorized for detection and/or diagnosis of SARS-CoV-2 by FDA under an Emergency Use Authorization (EUA). This EUA will remain in effect (meaning this test can be used) for the duration of the COVID-19 declaration under Section 564(b)(1) of the Act, 21 U.S.C. section 360bbb-3(b)(1), unless the authorization is terminated or revoked.  Performed at [facility], [address], [place], [place] [address]      Scheduled Meds:  ALPRAZolam  0.5 mg Oral TID   aspirin EC  81 mg Oral Daily   fluticasone  2 spray Each Nare Daily   insulin aspart  0-6 Units Subcutaneous TID WC   ipratropium-albuterol  3 mL Nebulization Q6H   levothyroxine  175 mcg Oral Q0600   methylPREDNISolone (SOLU-MEDROL) injection  80 mg Intravenous Q12H   morphine  15 mg Oral Q12H   pantoprazole  40 mg Oral BID   sertraline  100 mg Oral Daily   Continuous Infusions:  lactated ringers      Procedures/Studies: CT Angio Chest/Abd/Pel for Dissection W and/or Wo Contrast  Result Date: 05/09/2022 CLINICAL DATA:  Chest pain radiating into the neck, initial encounter EXAM: CT ANGIOGRAPHY CHEST, ABDOMEN AND PELVIS TECHNIQUE: Non-contrast CT of the chest was initially obtained. Multidetector CT imaging through the chest, abdomen and pelvis was performed using the standard protocol during bolus administration of intravenous contrast. Multiplanar reconstructed images and MIPs were obtained and reviewed to evaluate the vascular anatomy.  RADIATION DOSE REDUCTION: This exam was performed according to the departmental dose-optimization program which includes automated exposure control, adjustment of the mA and/or kV according to patient size and/or use of iterative reconstruction technique. CONTRAST:  192m OMNIPAQUE IOHEXOL 350 MG/ML SOLN COMPARISON:  07/15/2020 FINDINGS: CTA CHEST FINDINGS Cardiovascular: Initial precontrast images demonstrate atherosclerotic calcification of the thoracic aorta. No aneurysmal dilatation is seen. No hyperdense crescent to suggest acute aortic injury is noted. Postcontrast images were then obtained and reveal no evidence of aortic dissection. Heart is not significantly enlarged. The pulmonary artery shows a normal branching pattern bilaterally. No filling defect to suggest pulmonary embolism is noted. Coronary calcifications are noted. Mediastinum/Nodes: Thoracic inlet is within normal limits. Prominent AP window lymph node has resolved in the interval from the prior exam. Persistent right hilar lymph nodes are seen likely reactive in nature. The esophagus as visualized is within normal limits. Lungs/Pleura: Lungs demonstrate diffuse fibrotic changes and honeycombing particularly in the bases. The previously seen diffuse superimposed infiltrates have resolved.  There is a 4 mm nodule in the right upper lobe on image number 26 of series 9. No other sizable nodules are seen. No effusions are noted. Musculoskeletal: Degenerative changes of the thoracic spine are noted. No acute rib abnormality is seen. Chronic compression deformities of T5 and T7 are noted. Review of the MIP images confirms the above findings. CTA ABDOMEN AND PELVIS FINDINGS VASCULAR Aorta: Atherosclerotic calcifications are noted without aneurysmal dilatation or dissection. Celiac: Patent without evidence of aneurysm, dissection, vasculitis or significant stenosis. SMA: Patent without evidence of aneurysm, dissection, vasculitis or significant stenosis.  Renals: Both renal arteries are patent without evidence of aneurysm, dissection, vasculitis, fibromuscular dysplasia or significant stenosis. IMA: Patent without evidence of aneurysm, dissection, vasculitis or significant stenosis. Inflow: Iliacs demonstrate mild atherosclerotic calcification although no focal stenosis is seen. Veins: Visualized venous structures appear within normal limits. IVC filter is noted in satisfactory position. Review of the MIP images confirms the above findings. NON-VASCULAR Hepatobiliary: Gallbladder has been surgically removed. Liver shows no focal abnormality. Pancreas: Unremarkable. No pancreatic ductal dilatation or surrounding inflammatory changes. Spleen: Normal in size without focal abnormality. Adrenals/Urinary Tract: Adrenal glands are within normal limits. Kidneys demonstrate a normal enhancement pattern bilaterally. No renal calculi or obstructive changes are seen. The bladder is partially distended. Stomach/Bowel: Fecal material is noted scattered throughout the colon. No obstructive or inflammatory changes are seen. The appendix is not well visualized and may have been surgically. No inflammatory changes are seen. The small bowel and stomach are unremarkable. Lymphatic: No lymphadenopathy is noted. Reproductive: Status post hysterectomy. No adnexal masses. Other: No abdominal wall hernia or abnormality. No abdominopelvic ascites. Musculoskeletal: Postsurgical changes are noted in the lower lumbar spine. Prior vertebral augmentation at L1 is seen. Chronic L3 compression fracture is noted. Review of the MIP images confirms the above findings. IMPRESSION: CTA of the chest: No evidence of aortic injury. No pulmonary embolism is seen. Persistent right hilar lymph nodes likely reactive in nature. Chronic fibrotic changes are noted in the lungs bilaterally similar to prior exam. 4 mm right solid pulmonary nodule within the upper lobe. Per Fleischner Society Guidelines, a  non-contrast Chest CT at 12 months is optional. If performed and the nodule is stable at 12 months, no further follow-up is recommended. These guidelines do not apply to immunocompromised patients and patients with cancer. Follow up in patients with significant comorbidities as clinically warranted. For lung cancer screening, adhere to Lung-RADS guidelines. Reference: Radiology. 2017; 284(1):228-43. CTA of the abdomen and pelvis: No aortic abnormality is noted. No acute abnormality is seen. Electronically Signed   By: Inez Catalina M.D.   On: 05/09/2022 23:40   CT Head Wo Contrast  Result Date: 05/09/2022 CLINICAL DATA:  New onset headache EXAM: CT HEAD WITHOUT CONTRAST TECHNIQUE: Contiguous axial images were obtained from the base of the skull through the vertex without intravenous contrast. RADIATION DOSE REDUCTION: This exam was performed according to the departmental dose-optimization program which includes automated exposure control, adjustment of the mA and/or kV according to patient size and/or use of iterative reconstruction technique. COMPARISON:  None Available. FINDINGS: Brain: There is no mass, hemorrhage or extra-axial collection. The size and configuration of the ventricles and extra-axial CSF spaces are normal. The brain parenchyma is normal, without acute or chronic infarction. Vascular: No abnormal hyperdensity of the major intracranial arteries or dural venous sinuses. No intracranial atherosclerosis. Skull: The visualized skull base, calvarium and extracranial soft tissues are normal. Sinuses/Orbits: No fluid levels or advanced mucosal thickening  of the visualized paranasal sinuses. No mastoid or middle ear effusion. The orbits are normal. IMPRESSION: Normal head CT. Electronically Signed   By: Ulyses Jarred M.D.   On: 05/09/2022 23:22   DG Chest Portable 1 View  Result Date: 05/09/2022 CLINICAL DATA:  Chest pain and shortness of breath EXAM: PORTABLE CHEST 1 VIEW COMPARISON:  07/15/2020  FINDINGS: Cardiac shadow is stable. Diffuse fibrotic changes are identified throughout both lungs. No focal infiltrate or sizable effusion is seen. No bony abnormality is noted. IMPRESSION: Changes consistent with pulmonary fibrosis without acute abnormality. Electronically Signed   By: Inez Catalina M.D.   On: 05/09/2022 22:03    Orson Eva, DO  [facility]  If 7PM-7AM, please contact night-coverage [email or web address] Password [email or web address] 05/10/2022, 8:08 AM   LOS: 0 days

## 2022-05-10 NOTE — ED Provider Notes (Signed)
12:31 AM Assumed care from Dr. Doren Custard, please see their note for full history, physical and decision making until this point. In brief this is a 67 y.o. year old female who presented to the ED tonight with Chest Pain     67 yo F here with worsening dyspnea and new oxygen requirement. On bipap. Pending reeval.   Doing better on bipap. Still somewhat anxious. Difficult to tell if this is an exacerbation related to her fibrosis or something different. Not PE, PNA or effuison/edema on CT scan. Already had some breathing treatments and steroids which seem to have helped so will plan for hosptialist admission.   CRITICAL CARE Performed by: Merrily Pew Total critical care time: 35 minutes Critical care time was exclusive of separately billable procedures and treating other patients. Critical care was necessary to treat or prevent imminent or life-threatening deterioration. Critical care was time spent personally by me on the following activities: development of treatment plan with patient and/or surrogate as well as nursing, discussions with consultants, evaluation of patient's response to treatment, examination of patient, obtaining history from patient or surrogate, ordering and performing treatments and interventions, ordering and review of laboratory studies, ordering and review of radiographic studies, pulse oximetry and re-evaluation of patient's condition.   Labs, studies and imaging reviewed by myself and considered in medical decision making if ordered. Imaging interpreted by radiology.  Labs Reviewed  BASIC METABOLIC PANEL - Abnormal; Notable for the following components:      Result Value   Sodium 134 (*)    Glucose, Bld 110 (*)    All other components within normal limits  BRAIN NATRIURETIC PEPTIDE - Abnormal; Notable for the following components:   B Natriuretic Peptide 274.0 (*)    All other components within normal limits  CBC WITH DIFFERENTIAL/PLATELET - Abnormal; Notable for the  following components:   WBC 11.8 (*)    RBC 3.65 (*)    Hemoglobin 10.2 (*)    HCT 32.6 (*)    Neutro Abs 9.5 (*)    All other components within normal limits  RESP PANEL BY RT-PCR (RSV, FLU A&B, COVID)  [id number]  RESPIRATORY PANEL BY PCR  HEPATIC FUNCTION PANEL  MAGNESIUM  LIPASE, BLOOD  PROTIME-INR  TSH  TROPONIN I (HIGH SENSITIVITY)  TROPONIN I (HIGH SENSITIVITY)    CT Angio Chest/Abd/Pel for Dissection W and/or Wo Contrast  Final Result    CT Head Wo Contrast  Final Result    DG Chest Portable 1 View  Final Result      No follow-ups on file.    [name] Fitzpatrick, [name] Cornea, MD 05/10/22 [phone]

## 2022-05-10 NOTE — Hospital Course (Signed)
67 year old female with a history of chronic respiratory failure on 6 L, pulmonary fibrosis, diabetes mellitus type 2, anemia of chronic disease, anxiety, hypertension presenting with worsening cough and shortness of breath over the last 2 to 3 days.  The patient states that she has been having some headaches which began on 05/04/2022 followed by some emesis.  She states that her emesis has improved, unfortunately, her shortness of breath and coughing have worsened.  She has had some fevers at home up to 101.2 F.  She denies any hemoptysis, abdominal pain, diarrhea, hematochezia, melena.  She denies any focal extremity weakness or visual loss. She denies any worsening lower extremity edema. In the ED, the patient was afebrile with soft blood pressures in the 90s.  WBC 11.8, hemoglobin 10.2, platelets 222,000. BMP showed sodium 134, potassium 4.1, bicarbonate 26, BUN 20, creatinine 0.74.  LFTs unremarkable.  BNP 274.  CTA chest was negative for PE, negative dissection.  There is diffuse fibrotic changes with honeycombing.  There is a right upper lobe 4 mm nodule.  CTA abdomen pelvis was negative for any acute findings.  EKG shows sinus rhythm with incomplete right bundle branch block.  Troponin 11>>

## 2022-05-10 NOTE — ED Notes (Signed)
ED TO INPATIENT HANDOFF REPORT  ED Nurse Name and Phone #: Anderson Malta [phone]  S Name/Age/Gender [name] Fitzpatrick 67 y.o. female Room/Bed: APA05/APA05  Code Status   Code Status: Full Code  Home/SNF/Other Home Patient oriented to: self, place, time, and situation Is this baseline? Yes   Triage Complete: Triage complete  Chief Complaint Acute on chronic hypoxic respiratory failure ([place]) [J96.21]  Triage Note Pt with c/o generalized CP today and now radiates up left neck.  Pt states she has had nosebleeds earlier today, no bleeding at  present. Pt denies taking blood thinners.  Pt state she had a HA and emesis on Friday. Pt on home O2 at 4 L/M.  Pt states right sided HA since waking up this morning, not able to state exact time.    Allergies Allergies  Allergen Reactions   Levofloxacin Shortness Of Breath and Itching    WHELPS WHELPS WHELPS WHELPS    Penicillins Other (See Comments)    Stroke-like symptoms Has patient had a PCN reaction causing immediate rash, facial/tongue/throat swelling, SOB or lightheadedness with hypotension: YES Has patient had a PCN reaction causing severe rash involving mucus membranes or skin necrosis: No Has patient had a PCN reaction that required hospitalization: No Has patient had a PCN reaction occurring within the last 10 years: Yes If all of the above answers are "NO", then may proceed with Cephalosporin use.    Prednisone Shortness Of Breath and Nausea And Vomiting   Cephalexin Hives, Swelling and Rash   Latex Rash and Other (See Comments)    Causes skin to tear easily   Metoclopramide Hcl Hives and Rash    Level of Care/Admitting Diagnosis ED Disposition     ED Disposition  Admit   Condition  --   [place]: [facility] [id number]  Level of Care: Stepdown [14]  Covid Evaluation: Confirmed COVID Negative  Diagnosis: Acute on chronic hypoxic respiratory failure [facility]) [id number]  Admitting Physician:  Rhetta Mura [id number]  Attending Physician: Rhetta Mura [id number]  Certification:: I certify this patient will need inpatient services for at least 2 midnights  Estimated Length of Stay: 2          B Medical/Surgery History Past Medical History:  Diagnosis Date   Acute deep vein thrombosis (DVT) of left femoral vein (HCC)    Acute on chronic respiratory failure with hypoxia (HCC)    Anxiety    ARDS (adult respiratory distress syndrome) (HCC)    Chronic LBP    Chronic neck pain    COPD (chronic obstructive pulmonary disease) ([place])    Depression    DM type 2 (diabetes mellitus, type 2) ([place])    Dyspnea 05/25/2020   Fibromyalgia    GERD (gastroesophageal reflux disease)    H/O pulmonary fibrosis    H/O suicide attempt    Hashimoto's thyroiditis    Hypertension    Hypothyroidism    IBS (irritable bowel syndrome)    with constipation   Interstitial pneumonia (HCC)    Migraine headache    Osteoarthritis    Seizure disorder ([place])    Thyroiditis, 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   had total thyroidectomy 11/30/2013 at [facility]   Past Surgical History:  Procedure Laterality Date   ABDOMINAL HYSTERECTOMY     b/l foot surgery --[place]     BIOPSY  07/29/2015   Procedure: BIOPSY;  Surgeon: Rogene Houston, MD;  Location: [facility] ENDO SUITE;  Service: Endoscopy;;  Duodenal,  gastric, and esophageal  biopsies  C-Spine fusion  1991, 2004   CHOLECYSTECTOMY     COLONOSCOPY WITH PROPOFOL N/A 07/29/2015   Procedure: COLONOSCOPY WITH PROPOFOL;  Surgeon: Rogene Houston, MD;  Location: [facility] ENDO SU[facility];  Service: Endoscopy;  Laterality: N/A;  8:30   ESOPHAGEAL DILATION N/A 07/29/2015   Procedure: ESOPHAGEAL DILATION;  Surgeon: Rogene Houston, MD;  Location: [facility] END[facility] SUITE;  Service: Endoscopy;  Laterality: N/A;   ESOPHAGOGASTRODUODENOSCOPY (EGD) WITH PROPOFOL N/A 07/29/2015   Procedure: ESOPHAGOGASTRODUODENOSCOPY (EGD) WITH PROPOFOL;  Surgeon: Rogene Houston, MD;  Location: [facility] SU[facility];   Service: Endoscopy;  Laterality: N/A;   l carpal tunnel release Right    l spine fusion x 2     reconstucted right ankle     Right knee arthroscopic surgery     total reconstruction of right ankle and heel     total of three    VESICOVAGINAL FISTULA CLOSURE W/ TAH  1993   Fibroids no Ca     A IV Location/Drains/Wounds Patient Lines/Drains/Airways Status     Active Line/Drains/Airways     Name Placement date Placement time Site Days   Peripheral IV 05/09/22 20 G Left Antecubital 05/09/22  2141  Antecubital  1   External Urinary Catheter 05/10/22  0513  --  less than 1            Intake/Output Last 24 hours No intake or output data in the 24 hours ending 05/10/22 1021  Labs/Imaging Results for orders placed or performed during the hospital encounter of 05/09/22 (from the past 48 hour(s))  Resp panel by RT-PCR (RSV, Flu A&B, Covid) Anterior Nasal Swab     Status: None   Collection Time: 05/09/22  9:55 PM   Specimen: Anterior Nasal Swab  Result Value Ref Range   SARS Coronavirus 2 by RT PCR NEGATIVE NEGATIVE    Comment: (NOTE) SARS-CoV-2 target nucleic acids are NOT DETECTED.  The SARS-CoV-2 RNA is generally detectable in upper respiratory specimens during the acute phase of infection. The lowest concentration of SARS-CoV-2 viral copies this assay can detect is 138 copies/mL. A negative result does not preclude SARS-Cov-2 infection and should not be used as the sole basis for treatment or other patient management decisions. A negative result may occur with  improper specimen collection/handling, submission of specimen other than nasopharyngeal swab, presence of viral mutation(s) within the areas targeted by this assay, and inadequate number of viral copies(<138 copies/mL). A negative result must be combined with clinical observations, patient history, and epidemiological information. The expected result is Negative.  Fact Sheet for Patients:   EntrepreneurPulse.com.au  Fact Sheet for Healthcare Providers:  IncredibleEmployment.be  This test is no t yet approved or cleared by the [place] FDA and  has been authorized for detection and/or diagnosis of SARS-CoV-2 by FDA under an Emergency Use Authorization (EUA). This EUA will remain  in effect (meaning this test can be used) for the duration of the COVID-19 declaration under Section 564(b)(1) of the Act, 21 U.S.C.section 360bbb-3(b)(1), unless the authorization is terminated  or revoked sooner.       Influenza A by PCR NEGATIVE NEGATIVE   Influenza B by PCR NEGATIVE NEGATIVE    Comment: (NOTE) The Xpert Xpress SARS-CoV-2/FLU/RSV plus assay is intended as an aid in the diagnosis of influenza from Nasopharyngeal swab specimens and should not be used as a sole basis for treatment. Nasal washings and aspirates are unacceptable for Xpert Xpress SARS-CoV-2/FLU/RSV testing.  Fact Sheet for Patients: EntrepreneurPulse.com.au  Fact  Sheet for Healthcare Providers: IncredibleEmployment.be  This test is not yet approved or cleared by the [place] and has been authorized for detection and/or diagnosis of SARS-CoV-2 by FDA under an Emergency Use Authorization (EUA). This EUA will remain in effect (meaning this test can be used) for the duration of the COVID-19 declaration under Section 564(b)(1) of the Act, 21 U.S.C. section 360bbb-3(b)(1), unless the authorization is terminated or revoked.     Resp Syncytial Virus by PCR NEGATIVE NEGATIVE    Comment: (NOTE) Fact Sheet for Patients: EntrepreneurPulse.com.au  Fact Sheet for Healthcare Providers: IncredibleEmployment.be  This test is not yet approved or cleared by the [place] FDA and has been authorized for detection and/or diagnosis of SARS-CoV-2 by FDA under an Emergency Use Authorization (EUA).  This EUA will remain in effect (meaning this test can be used) for the duration of the COVID-19 declaration under Section 564(b)(1) of the Act, 21 U.S.C. section 360bbb-3(b)(1), unless the authorization is terminated or revoked.  Performed at [facility], [address] [place], [place] [id number]   Basic metabolic panel     Status: Abnormal   Collection Time: 05/09/22 10:01 PM  Result Value Ref Range   Sodium 134 (L) 135 - 145 mmol/L   Potassium 4.1 3.5 - 5.1 mmol/L   Chloride 98 98 - 111 mmol/L   CO2 26 22 - 32 mmol/L   Glucose, Bld 110 (H) 70 - 99 mg/dL    Comment: Glucose reference range applies only to samples taken after fasting for at least 8 hours.   BUN 20 8 - 23 mg/dL   Creatinine, Ser 0.74 0.44 - 1.00 mg/dL   Calcium 9.1 8.9 - 10.3 mg/dL   GFR, Estimated >60 >60 mL/min    Comment: (NOTE) Calculated using the CKD-EPI Creatinine Equation (2021)    Anion gap 10 5 - 15    Comment: Performed at [facility], [address] [place], [place] [address]  Troponin I (High Sensitivity)     Status: None   Collection Time: 05/09/22 10:01 PM  Result Value Ref Range   Troponin I (High Sensitivity) 11 <18 ng/L    Comment: (NOTE) Elevated high sensitivity troponin I (hsTnI) values and significant  changes across serial measurements may suggest ACS but many other  chronic and acute conditions are known to elevate hsTnI results.  Refer to the "Links" section for chest pain algorithms and additional  guidance. Performed at [facility], [address] [place], [place] [address]   Hepatic function panel     Status: None   Collection Time: 05/09/22 10:01 PM  Result Value Ref Range   Total Protein 6.7 6.5 - 8.1 g/dL   Albumin 3.6 3.5 - 5.0 g/dL   AST 18 15 - 41 U/L   ALT 13 0 - 44 U/L   Alkaline Phosphatase 57 38 - 126 U/L   Total Bilirubin 0.4 0.3 - 1.2 mg/dL   Bilirubin, Direct 0.1 0.0 - 0.2 mg/dL   Indirect Bilirubin 0.3 0.3 - 0.9 mg/dL    Comment: Performed at [facility], [address], [place], [place] [address]  Magnesium     Status: None   Collection Time: 05/09/22 10:01 PM  Result Value Ref Range   Magnesium 1.8 1.7 - 2.4 mg/dL    Comment: Performed at [facility], [address] [place],  [address]  Lipase, blood     Status: None   Collection Time: 05/09/22 10:01 PM  Result Value Ref Range  Lipase 25 11 - 51 U/L    Comment: Performed at [facility], [address], [place], [place] [address]  Brain natriuretic peptide (order if patient c/o SOB ONLY)     Status: Abnormal   Collection Time: 05/09/22 10:01 PM  Result Value Ref Range   B Natriuretic Peptide 274.0 (H) 0.0 - 100.0 pg/mL    Comment: Performed at [facility], [address] [place], [place] [address]  CBC with Differential/Platelet     Status: Abnormal   Collection Time: 05/09/22 10:01 PM  Result Value Ref Range   WBC 11.8 (H) 4.0 - 10.5 K/uL   RBC 3.65 (L) 3.87 - 5.11 MIL/uL   Hemoglobin 10.2 (L) 12.0 - 15.0 g/dL   HCT 32.6 (L) 36.0 - 46.0 %   MCV 89.3 80.0 - 100.0 fL   MCH 27.9 26.0 - 34.0 pg   MCHC 31.3 30.0 - 36.0 g/dL   RDW 14.5 11.5 - 15.5 %   Platelets 222 150 - 400 K/uL   nRBC 0.0 0.0 - 0.2 %   Neutrophils Relative % 82 %   Neutro Abs 9.5 (H) 1.7 - 7.7 K/uL   Lymphocytes Relative 14 %   Lymphs Abs 1.7 0.7 - 4.0 K/uL   Monocytes Relative 4 %   Monocytes Absolute 0.5 0.1 - 1.0 K/uL   Eosinophils Relative 0 %   Eosinophils Absolute 0.0 0.0 - 0.5 K/uL   Basophils Relative 0 %   Basophils Absolute 0.1 0.0 - 0.1 K/uL   Immature Granulocytes 0 %   Abs Immature Granulocytes 0.03 0.00 - 0.07 K/uL    Comment: Performed at [facility], [address] [place], [place] [address]  Protime-INR     Status: None   Collection Time: 05/09/22 10:01 PM  Result Value Ref Range   Prothrombin Time 13.5 11.4 - 15.2 seconds   INR 1.0 0.8 - 1.2    Comment: (NOTE) INR goal varies based on device and disease states. Performed at [facility], [address] [place], [place]  [address]   TSH     Status: None   Collection Time: 05/09/22 10:01 PM  Result Value Ref Range   TSH 0.445 0.350 - 4.500 uIU/mL    Comment: Performed by a 3rd Generation assay with a functional sensitivity of <=0.01 uIU/mL. Performed at [facility], [address] [place], [place] [address]   Hemoglobin A1c     Status: None   Collection Time: 05/09/22 10:01 PM  Result Value Ref Range   Hgb A1c MFr Bld 5.3 4.8 - 5.6 %    Comment: (NOTE) Pre diabetes:          5.7%-6.4%  Diabetes:              >6.4%  Glycemic control for   <7.0% adults with diabetes    Mean Plasma Glucose 105.41 mg/dL    Comment: Performed at [facility] [address], [place], [place] [address]  Troponin I (High Sensitivity)     Status: None   Collection Time: 05/10/22 12:04 AM  Result Value Ref Range   Troponin I (High Sensitivity) 13 <18 ng/L    Comment: (NOTE) Elevated high sensitivity troponin I (hsTnI) values and significant  changes across serial measurements may suggest ACS but many other  chronic and acute conditions are known to elevate hsTnI results.  Refer to the "Links" section for chest pain algorithms and additional  guidance. Performed at [facility], [address], [place],  [place] [address]   CBC with Differential/Platelet     Status: Abnormal   Collection Time: 05/10/22  5:50 AM  Result Value Ref Range   WBC 6.7 4.0 - 10.5 K/uL   RBC 3.52 (L) 3.87 - 5.11 MIL/uL   Hemoglobin 10.1 (L) 12.0 - 15.0 g/dL   HCT 31.6 (L) 36.0 - 46.0 %   MCV 89.8 80.0 - 100.0 fL   MCH 28.7 26.0 - 34.0 pg   MCHC 32.0 30.0 - 36.0 g/dL   RDW 14.5 11.5 - 15.5 %   Platelets 186 150 - 400 K/uL   nRBC 0.0 0.0 - 0.2 %   Neutrophils Relative % 93 %   Neutro Abs 6.1 1.7 - 7.7 K/uL   Lymphocytes Relative 7 %   Lymphs Abs 0.5 (L) 0.7 - 4.0 K/uL   Monocytes Relative 0 %   Monocytes Absolute 0.0 (L) 0.1 - 1.0 K/uL   Eosinophils Relative 0 %   Eosinophils Absolute 0.0 0.0 - 0.5 K/uL   Basophils Relative 0 %    Basophils Absolute 0.0 0.0 - 0.1 K/uL   Immature Granulocytes 0 %   Abs Immature Granulocytes 0.02 0.00 - 0.07 K/uL    Comment: Performed at [facility], [address], [place], [place] [address]  Comprehensive metabolic panel     Status: Abnormal   Collection Time: 05/10/22  5:50 AM  Result Value Ref Range   Sodium 132 (L) 135 - 145 mmol/L   Potassium 4.2 3.5 - 5.1 mmol/L   Chloride 99 98 - 111 mmol/L   CO2 24 22 - 32 mmol/L   Glucose, Bld 157 (H) 70 - 99 mg/dL    Comment: Glucose reference range applies only to samples taken after fasting for at least 8 hours.   BUN 21 8 - 23 mg/dL   Creatinine, Ser 0.66 0.44 - 1.00 mg/dL   Calcium 8.9 8.9 - 10.3 mg/dL   Total Protein 6.8 6.5 - 8.1 g/dL   Albumin 3.5 3.5 - 5.0 g/dL   AST 18 15 - 41 U/L   ALT 13 0 - 44 U/L   Alkaline Phosphatase 58 38 - 126 U/L   Total Bilirubin 0.5 0.3 - 1.2 mg/dL   GFR, Estimated >60 >60 mL/min    Comment: (NOTE) Calculated using the CKD-EPI Creatinine Equation (2021)    Anion gap 9 5 - 15    Comment: Performed at [facility], [address] [place], [place] [address]  Magnesium     Status: None   Collection Time: 05/10/22  5:50 AM  Result Value Ref Range   Magnesium 1.8 1.7 - 2.4 mg/dL    Comment: Performed at [facility], [address], [place], [place] [address]  Phosphorus     Status: None   Collection Time: 05/10/22  5:50 AM  Result Value Ref Range   Phosphorus 3.9 2.5 - 4.6 mg/dL    Comment: Performed at [facility], [address], [place], [place] [address]  Blood gas, venous     Status: Abnormal   Collection Time: 05/10/22  5:50 AM  Result Value Ref Range   pH, Ven 7.45 (H) 7.25 - 7.43   pCO2, Ven 40 (L) 44 - 60 mmHg   pO2, Ven 107 (H) 32 - 45 mmHg   Bicarbonate 28.5 (H) 20.0 - 28.0 mmol/L   Acid-Base Excess 4.1 (H) 0.0 - 2.0 mmol/L   O2 Saturation 98.6 %   Patient temperature 36.7    Collection site LEFT ANTECUBITAL  Comment: Performed at [facility], [address]  [place], [place] [address]  Troponin I (High Sensitivity)     Status: None   Collection Time: 05/10/22  5:50 AM  Result Value Ref Range   Troponin I (High Sensitivity) 6 <18 ng/L    Comment: (NOTE) Elevated high sensitivity troponin I (hsTnI) values and significant  changes across serial measurements may suggest ACS but many other  chronic and acute conditions are known to elevate hsTnI results.  Refer to the "Links" section for chest pain algorithms and additional  guidance. Performed at [facility], [address] [place], [place] [address]   Culture, blood (Routine X 2) w Reflex to ID Panel     Status: None (Preliminary result)   Collection Time: 05/10/22  8:39 AM   Specimen: Right Antecubital; Blood  Result Value Ref Range   Specimen Description RIGHT ANTECUBITAL    Special Requests      BOTTLES DRAWN AEROBIC AND ANAEROBIC Blood Culture adequate volume Performed at [facility], [address], [place], [place] [address]    Culture PENDING    Report Status PENDING   Culture, blood (Routine X 2) w Reflex to ID Panel     Status: None (Preliminary result)   Collection Time: 05/10/22  8:39 AM   Specimen: BLOOD RIGHT ARM  Result Value Ref Range   Specimen Description BLOOD RIGHT ARM    Special Requests      BOTTLES DRAWN AEROBIC AND ANAEROBIC Blood Culture results may not be optimal due to an excessive volume of blood received in culture bottles Performed at [facility], [address] [place], [place] [address]    Culture PENDING    Report Status PENDING   Procalcitonin     Status: None   Collection Time: 05/10/22  8:39 AM  Result Value Ref Range   Procalcitonin <0.10 ng/mL    Comment:        Interpretation: PCT (Procalcitonin) <= 0.5 ng/mL: Systemic infection (sepsis) is not likely. Local bacterial infection is possible. (NOTE)       Sepsis PCT Algorithm           Lower Respiratory Tract                                      Infection PCT Algorithm     ----------------------------     ----------------------------         PCT < 0.25 ng/mL                PCT < 0.10 ng/mL          Strongly encourage             Strongly discourage   discontinuation of antibiotics    initiation of antibiotics    ----------------------------     -----------------------------       PCT 0.25 - 0.50 ng/mL            PCT 0.10 - 0.25 ng/mL               OR       >80% decrease in PCT            Discourage initiation of  antibiotics      Encourage discontinuation           of antibiotics    ----------------------------     -----------------------------         PCT >= 0.50 ng/mL              PCT 0.26 - 0.50 ng/mL               AND        <80% decrease in PCT             Encourage initiation of                                             antibiotics       Encourage continuation           of antibiotics    ----------------------------     -----------------------------        PCT >= 0.50 ng/mL                  PCT > 0.50 ng/mL               AND         increase in PCT                  Strongly encourage                                      initiation of antibiotics    Strongly encourage escalation           of antibiotics                                     -----------------------------                                           PCT <= 0.25 ng/mL                                                 OR                                        > 80% decrease in PCT                                      Discontinue / Do not initiate                                             antibiotics  Performed at [facility], [address], [place], [place] [address]   Iron and TIBC     Status: Abnormal   Collection Time: 05/10/22  8:39 AM  Result Value Ref Range   Iron 22 (L) 28 - 170 ug/dL   TIBC 408 250 - 450 ug/dL   Saturation Ratios 5 (L) 10.4 - 31.8 %   UIBC 386 ug/dL    Comment: Performed at [facility], [address]  [place], [place] [address]  Ferritin     Status: None   Collection Time: 05/10/22  8:39 AM  Result Value Ref Range   Ferritin 16 11 - 307 ng/mL    Comment: Performed at [facility], [address] [place], [place] [address]  Vitamin B12     Status: None   Collection Time: 05/10/22  8:39 AM  Result Value Ref Range   Vitamin B-12 490 180 - 914 pg/mL    Comment: (NOTE) This assay is not validated for testing neonatal or myeloproliferative syndrome specimens for Vitamin B12 levels. Performed at [facility], [address], [place], [place] [address]    CT Angio Chest/Abd/Pel for Dissection W and/or Wo Contrast  Result Date: 05/09/2022 CLINICAL DATA:  Chest pain radiating into the neck, initial encounter EXAM: CT ANGIOGRAPHY CHEST, ABDOMEN AND PELVIS TECHNIQUE: Non-contrast CT of the chest was initially obtained. Multidetector CT imaging through the chest, abdomen and pelvis was performed using the standard protocol during bolus administration of intravenous contrast. Multiplanar reconstructed images and MIPs were obtained and reviewed to evaluate the vascular anatomy. RADIATION DOSE REDUCTION: This exam was performed according to the departmental dose-optimization program which includes automated exposure control, adjustment of the mA and/or kV according to patient size and/or use of iterative reconstruction technique. CONTRAST:  149m OMNIPAQUE IOHEXOL 350 MG/ML SOLN COMPARISON:  07/15/2020 FINDINGS: CTA CHEST FINDINGS Cardiovascular: Initial precontrast images demonstrate atherosclerotic calcification of the thoracic aorta. No aneurysmal dilatation is seen. No hyperdense crescent to suggest acute aortic injury is noted. Postcontrast images were then obtained and reveal no evidence of aortic dissection. Heart is not significantly enlarged. The pulmonary artery shows a normal branching pattern bilaterally. No filling defect to suggest pulmonary embolism is noted. Coronary calcifications are noted.  Mediastinum/Nodes: Thoracic inlet is within normal limits. Prominent AP window lymph node has resolved in the interval from the prior exam. Persistent right hilar lymph nodes are seen likely reactive in nature. The esophagus as visualized is within normal limits. Lungs/Pleura: Lungs demonstrate diffuse fibrotic changes and honeycombing particularly in the bases. The previously seen diffuse superimposed infiltrates have resolved. There is a 4 mm nodule in the right upper lobe on image number 26 of series 9. No other sizable nodules are seen. No effusions are noted. Musculoskeletal: Degenerative changes of the thoracic spine are noted. No acute rib abnormality is seen. Chronic compression deformities of T5 and T7 are noted. Review of the MIP images confirms the above findings. CTA ABDOMEN AND PELVIS FINDINGS VASCULAR Aorta: Atherosclerotic calcifications are noted without aneurysmal dilatation or dissection. Celiac: Patent without evidence of aneurysm, dissection, vasculitis or significant stenosis. SMA: Patent without evidence of aneurysm, dissection, vasculitis or significant stenosis. Renals: Both renal arteries are patent without evidence of aneurysm, dissection, vasculitis, fibromuscular dysplasia or significant stenosis. IMA: Patent without evidence of aneurysm, dissection, vasculitis or significant stenosis. Inflow: Iliacs demonstrate mild atherosclerotic calcification although no focal stenosis is seen. Veins: Visualized venous structures appear within normal limits. IVC filter is noted in satisfactory position. Review of the MIP images confirms the above findings. NON-VASCULAR Hepatobiliary: Gallbladder has been surgically removed. Liver shows no focal abnormality. Pancreas: Unremarkable. No pancreatic ductal dilatation or surrounding inflammatory changes.  Spleen: Normal in size without focal abnormality. Adrenals/Urinary Tract: Adrenal glands are within normal limits. Kidneys demonstrate a normal enhancement  pattern bilaterally. No renal calculi or obstructive changes are seen. The bladder is partially distended. Stomach/Bowel: Fecal material is noted scattered throughout the colon. No obstructive or inflammatory changes are seen. The appendix is not well visualized and may have been surgically. No inflammatory changes are seen. The small bowel and stomach are unremarkable. Lymphatic: No lymphadenopathy is noted. Reproductive: Status post hysterectomy. No adnexal masses. Other: No abdominal wall hernia or abnormality. No abdominopelvic ascites. Musculoskeletal: Postsurgical changes are noted in the lower lumbar spine. Prior vertebral augmentation at L1 is seen. Chronic L3 compression fracture is noted. Review of the MIP images confirms the above findings. IMPRESSION: CTA of the chest: No evidence of aortic injury. No pulmonary embolism is seen. Persistent right hilar lymph nodes likely reactive in nature. Chronic fibrotic changes are noted in the lungs bilaterally similar to prior exam. 4 mm right solid pulmonary nodule within the upper lobe. Per Fleischner Society Guidelines, a non-contrast Chest CT at 12 months is optional. If performed and the nodule is stable at 12 months, no further follow-up is recommended. These guidelines do not apply to immunocompromised patients and patients with cancer. Follow up in patients with significant comorbidities as clinically warranted. For lung cancer screening, adhere to Lung-RADS guidelines. Reference: Radiology. 2017; 284(1):228-43. CTA of the abdomen and pelvis: No aortic abnormality is noted. No acute abnormality is seen. Electronically Signed   By: Inez Catalina M.D.   On: 05/09/2022 23:40   CT Head Wo Contrast  Result Date: 05/09/2022 CLINICAL DATA:  New onset headache EXAM: CT HEAD WITHOUT CONTRAST TECHNIQUE: Contiguous axial images were obtained from the base of the skull through the vertex without intravenous contrast. RADIATION DOSE REDUCTION: This exam was performed  according to the departmental dose-optimization program which includes automated exposure control, adjustment of the mA and/or kV according to patient size and/or use of iterative reconstruction technique. COMPARISON:  None Available. FINDINGS: Brain: There is no mass, hemorrhage or extra-axial collection. The size and configuration of the ventricles and extra-axial CSF spaces are normal. The brain parenchyma is normal, without acute or chronic infarction. Vascular: No abnormal hyperdensity of the major intracranial arteries or dural venous sinuses. No intracranial atherosclerosis. Skull: The visualized skull base, calvarium and extracranial soft tissues are normal. Sinuses/Orbits: No fluid levels or advanced mucosal thickening of the visualized paranasal sinuses. No mastoid or middle ear effusion. The orbits are normal. IMPRESSION: Normal head CT. Electronically Signed   By: Ulyses Jarred M.D.   On: 05/09/2022 23:22   DG Chest Portable 1 View  Result Date: 05/09/2022 CLINICAL DATA:  Chest pain and shortness of breath EXAM: PORTABLE CHEST 1 VIEW COMPARISON:  07/15/2020 FINDINGS: Cardiac shadow is stable. Diffuse fibrotic changes are identified throughout both lungs. No focal infiltrate or sizable effusion is seen. No bony abnormality is noted. IMPRESSION: Changes consistent with pulmonary fibrosis without acute abnormality. Electronically Signed   By: Inez Catalina M.D.   On: 05/09/2022 22:03    Pending Labs Unresulted Labs (From admission, onward)     Start     Ordered   05/11/22 [id number]  Basic metabolic panel  Tomorrow morning,   R        05/10/22 0817   05/11/22 0500  Magnesium  Tomorrow morning,   R        05/10/22 0817   05/10/22 0818  Folate  Once,   R  05/10/22 0817   05/10/22 0120  Urinalysis, Complete w Microscopic -Urine, Clean Catch  Once,   R       Question:  Specimen Source  Answer:  Urine, Clean Catch   05/10/22 0120   05/10/22 0005  Respiratory (~20 pathogens) panel by PCR   (Respiratory panel by PCR (~20 pathogens, ~24 hr TAT)  w precautions)  Once,   R        05/10/22 0004            Vitals/Pain Today's Vitals   05/10/22 0630 05/10/22 0645 05/10/22 0715 05/10/22 0840  BP: (!) 81/70 97/61 105/84   Pulse: 87  86 100  Resp:   (!) 28 (!) 25  Temp:      TempSrc:      SpO2: 90%  92% 100%  Weight:      Height:      PainSc:        Isolation Precautions Droplet precaution  Medications Medications  acetaminophen (TYLENOL) tablet 650 mg (has no administration in time range)    Or  acetaminophen (TYLENOL) suppository 650 mg (has no administration in time range)  melatonin tablet 3 mg (has no administration in time range)  ipratropium-albuterol (DUONEB) 0.5-2.5 (3) MG/3ML nebulizer solution 3 mL (3 mLs Nebulization Given 05/10/22 0738)  albuterol (PROVENTIL) (2.5 MG/3ML) 0.083% nebulizer solution 2.5 mg (has no administration in time range)  methylPREDNISolone sodium succinate (SOLU-MEDROL) 125 mg/2 mL injection 80 mg (80 mg Intravenous Given 05/10/22 0738)  LORazepam (ATIVAN) injection 0.5 mg (0.5 mg Intravenous Given 05/10/22 0739)  insulin aspart (novoLOG) injection 0-6 Units ( Subcutaneous Not Given 05/10/22 0909)  aspirin EC tablet 81 mg (81 mg Oral Given 05/10/22 0924)  fluticasone (FLONASE) 50 MCG/ACT nasal spray 2 spray (2 sprays Each Nare Given 05/10/22 0923)  levothyroxine (SYNTHROID) tablet 175 mcg (175 mcg Oral Not Given 05/10/22 0535)  pantoprazole (PROTONIX) EC tablet 40 mg (40 mg Oral Given 05/10/22 0737)  sertraline (ZOLOFT) tablet 100 mg (100 mg Oral Given 05/10/22 0924)  morphine (MS CONTIN) 12 hr tablet 15 mg (15 mg Oral Given 05/10/22 0924)  ALPRAZolam (XANAX) tablet 0.5 mg (0.5 mg Oral Given 05/10/22 0924)  HYDROmorphone (DILAUDID) tablet 4 mg (has no administration in time range)  arformoterol (BROVANA) nebulizer solution 15 mcg (15 mcg Nebulization Given 05/10/22 0830)  budesonide (PULMICORT) nebulizer solution 0.5 mg (0.5 mg Nebulization  Given 05/10/22 0835)  aspirin chewable tablet 324 mg (324 mg Oral Given 05/09/22 2149)  nitroGLYCERIN (NITROGLYN) 2 % ointment 1 inch (1 inch Topical Given 05/09/22 2149)  fentaNYL (SUBLIMAZE) injection 50 mcg (50 mcg Intravenous Given 05/09/22 2149)  LORazepam (ATIVAN) injection 0.5 mg (0.5 mg Intravenous Given 05/09/22 2149)  ketorolac (TORADOL) 15 MG/ML injection 15 mg (15 mg Intravenous Given 05/09/22 2254)  iohexol (OMNIPAQUE) 350 MG/ML injection 100 mL (100 mLs Intravenous Contrast Given 05/09/22 2257)  fentaNYL (SUBLIMAZE) injection 50 mcg (50 mcg Intravenous Given 05/09/22 2318)  ipratropium-albuterol (DUONEB) 0.5-2.5 (3) MG/3ML nebulizer solution 3 mL (3 mLs Nebulization Given 05/09/22 2356)  methylPREDNISolone sodium succinate (SOLU-MEDROL) 125 mg/2 mL injection 80 mg (80 mg Intravenous Given 05/09/22 2350)  morphine (PF) 2 MG/ML injection 1 mg (1 mg Intravenous Given 05/10/22 0745)  lactated ringers bolus 1,000 mL (1,000 mLs Intravenous New Bag/Given 05/10/22 0925)    Mobility walks with device     Focused Assessments Cardiac Assessment Handoff:  Cardiac Rhythm: Normal sinus rhythm Lab Results  Component Value Date   CKTOTAL 61 10/21/2010  CKMB 2.5 10/21/2010   TROPONINI <0.30 11/09/2013   Lab Results  Component Value Date   DDIMER 0.80 (H) 07/16/2020   Does the Patient currently have chest pain? No   , Pulmonary Assessment Handoff:  Lung sounds: Bilateral Breath Sounds: Diminished O2 Device: High Flow Nasal Cannula O2 Flow Rate (L/min): 15 L/min    R Recommendations: See Admitting Provider Note  Report given to:   Additional Notes:

## 2022-05-10 NOTE — Progress Notes (Signed)
Call to place patient back on BIPAP. Upon arrival to room patient was 96% on 15 lpm nasal cannula. Patient is refusing to go back on machine at this time. RN in room and heard conversation. Patient is stating she is going to call her husband and have him take her to [facility].

## 2022-05-10 NOTE — Consult Note (Signed)
NAME:  [name] Fitzpatrick, MRN:  [id number], DOB:  [birth year], LOS: 0 ADMISSION DATE:  05/09/2022, CONSULTATION DATE:  05/10/2022 REFERRING MD:  Dr. Carles Collet, [facility] CHIEF COMPLAINT:  dypsnea   History of Present Illness:  67 yo female former smoker with hx of ARDS and resulting pulmonary fibrosis, and COPD presented to [facility] ER with worsening dyspnea and cough.  Six days prior to admission she had fever 102F, sinus pressure and headache, and nose bleed.  SpO2 was 88% on her baseline oxygen of 6 liters.  She was started on Bipap.  PCCM consulted to assist with respiratory management.  Pertinent  Medical History  DVT, ARDS, COPD, Depression, DM type 2, Fibromyalgia, GERD, Hashimoto's thyroiditis, HTN, IBS, Migraine headache, Seizure, Chronic pain, Atrial fibrillation  Significant Hospital Events: Including procedures, antibiotic start and stop dates in addition to other pertinent events   2/21 Admit  Tests:  PFT 02/26/19 >> FEV1 1.15 (51%), FEV1% 81, DLCO 27% PFT 03/01/21 >> FEV1 1.28 (58%), FEV1% 81 Echo 07/20/21 >> EF 60 to 65%, mild LVH LHC 08/03/21 >> normal coronary angiogram CT angio chest 05/09/22 >> diffuse fibrotic changes and honeycombing more at bases, 4 mm nodule RUL  Interim History / Subjective:  She was hospitalized in 2020 for pneumonia with ARDS.  She required intubation and tracheostomy.  She developed pulmonary fibrosis after this.  She normally uses 8 liters oxygen at home.  Her activity level is severely limited due to her breathing.  She was told by her previous medical team in [place] that she should consider hospice care.  Objective   Blood pressure (!) 96/59, pulse 85, temperature 98.1 F (36.7 C), temperature source Oral, resp. rate 18, height 5' 2"$  (1.575 m), weight 58.1 kg, SpO2 100 %.    FiO2 (%):  [45 %-60 %] 45 %  No intake or output data in the 24 hours ending 05/10/22 1257 Filed Weights   05/09/22 2125  Weight: 58.1 kg    Examination:  General -  fidgety Eyes - pupils reactive ENT - no sinus tenderness, no stridor Cardiac - irregular, tachycardic Chest - bilateral crackles more at bases Abdomen - soft, non tender, + bowel sounds Extremities - no cyanosis, clubbing, or edema Skin - no rashes Neuro - dyskinetic movements Psych - anxious  Resolved Hospital Problem list     Assessment & Plan:   Acute on chronic hypoxic/hypercapnic respiratory failure likely from acute viral respiratory infection in setting of severe pulmonary fibrosis and COPD exacerbation. - adjust oxygen to keep SpO2 88 to 95% - Bipap prn - change to yupelri, brovana, pulmicort - prn albuterol - continue solumedrol - add mucinex and flutter valve - f/u CXR intermittently - f/u respiratory viral panel  Atrial fibrillation with PFO. - previously followed by cardiology at [facility]  Dyskinetic movements. Anxiety. Chronic pain. Hypothyroidism. DM type 2 poorly controlled with steroid induced hyperglycemia. Epistaxis. - per primary team  Goals of care. - had detailed discussion about severity of her lung disease  - advised her to discuss with her family regarding her wishes - at this time she would be okay to proceed with intubation if needed to see if this helps get her better - she is less certain about whether she would want long term vent support or whether she would want CPR in the event of cardiac arrest - palliative care has been consulted  Best Practice (right click and "Reselect all SmartList Selections" daily)   Diet/type: Regular consistency (see orders) DVT prophylaxis:  SCD GI prophylaxis: PPI Lines: N/A Foley:  N/A Code Status:  full code Last date of multidisciplinary goals of care discussion [x]$   Labs   CBC: Recent Labs  Lab 05/09/22 2201 05/10/22 0550  WBC 11.8* 6.7  NEUTROABS 9.5* 6.1  HGB 10.2* 10.1*  HCT 32.6* 31.6*  MCV 89.3 89.8  PLT 222 [id number]    Basic Metabolic Panel: Recent Labs  Lab 05/09/22 2201  05/10/22 0550  NA 134* 132*  K 4.1 4.2  CL 98 99  CO2 26 24  GLUCOSE 110* 157*  BUN 20 21  CREATININE 0.74 0.66  CALCIUM 9.1 8.9  MG 1.8 1.8  PHOS  --  3.9   GFR: Estimated Creatinine Clearance: 54.7 mL/min (by C-G formula based on SCr of 0.66 mg/dL). Recent Labs  Lab 05/09/22 2201 05/10/22 0550 05/10/22 0839  PROCALCITON  --   --  <0.10  WBC 11.8* 6.7  --     Liver Function Tests: Recent Labs  Lab 05/09/22 2201 05/10/22 0550  AST 18 18  ALT 13 13  ALKPHOS 57 58  BILITOT 0.4 0.5  PROT 6.7 6.8  ALBUMIN 3.6 3.5   Recent Labs  Lab 05/09/22 2201  LIPASE 25   No results for input(s): "AMMONIA" in the last 168 hours.  ABG    Component Value Date/Time   PHART 7.321 (L) 07/15/2020 2121   PCO2ART 54.1 (H) 07/15/2020 2121   PO2ART 106 07/15/2020 2121   HCO3 28.5 (H) 05/10/2022 0550   TCO2 32 05/25/2020 1315   ACIDBASEDEF 0.1 01/30/2010 2033   O2SAT 98.6 05/10/2022 0550     Coagulation Profile: Recent Labs  Lab 05/09/22 2201  INR 1.0    Cardiac Enzymes: No results for input(s): "CKTOTAL", "CKMB", "CKMBINDEX", "TROPONINI" in the last 168 hours.  HbA1C: Hgb A1c MFr Bld  Date/Time Value Ref Range Status  05/09/2022 10:01 PM 5.3 4.8 - 5.6 % Final    Comment:    (NOTE) Pre diabetes:          5.7%-6.4%  Diabetes:              >6.4%  Glycemic control for   <7.0% adults with diabetes   10/25/2014 04:25 PM 5.6 <5.7 % Final    Comment:                                                                           According to the ADA Clinical Practice Recommendations for 2011, when HbA1c is used as a screening test:     >=6.5%   Diagnostic of Diabetes Mellitus            (if abnormal result is confirmed)   5.7-6.4%   Increased risk of developing Diabetes Mellitus   References:Diagnosis and Classification of Diabetes Mellitus,Diabetes [id number] 1):S62-S69 and Standards of Medical Care in         Diabetes - 2011,Diabetes [id number] (Suppl  1):S11-S61.       CBG: Recent Labs  Lab 05/10/22 1112  GLUCAP 182*    Review of Systems:   Reviewed and negative  Past Medical History:  She,  has a past medical history of Acute deep vein thrombosis (DVT) of left femoral vein ([place]), Acute on  chronic respiratory failure with hypoxia (HCC), Anxiety, ARDS (adult respiratory distress syndrome) (HCC), Chronic LBP, Chronic neck pain, COPD (chronic obstructive pulmonary disease) ([place]), Depression, DM type 2 (diabetes mellitus, type 2) ([place]), Dyspnea (05/25/2020), Fibromyalgia, GERD (gastroesophageal reflux disease), H/O pulmonary fibrosis, H/O suicide attempt, Hashimoto's thyroiditis, Hypertension, Hypothyroidism, IBS (irritable bowel syndrome), 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   Surgical History:   Past Surgical History:  Procedure Laterality Date   ABDOMINAL HYSTERECTOMY     b/l foot surgery --bunions     BIOPSY  07/29/2015   Procedure: BIOPSY;  Surgeon: Rogene Houston, MD;  [facility];  Service: Endoscopy;;  Duodenal,  gastric, and esophageal  biopsies   C-Spine fusion  1991, 2004   CHOLECYSTECTOMY     COLONOSCOPY WITH PROPOFOL N/A 07/29/2015   Procedure: COLONOSCOPY WITH PROPOFOL;  Surgeon: Rogene Houston, MD;  Location: [facility];  Service: Endoscopy;  Laterality: N/A;  8:30   ESOPHAGEAL DILATION N/A 07/29/2015   Procedure: ESOPHAGEAL DILATION;  Surgeon: Rogene Houston, MD;  [facility]: [facility];  Service: Endoscopy;  Laterality: N/A;   ESOPHAGOGASTRODUODENOSCOPY (EGD) WITH PROPOFOL N/A 07/29/2015   Procedure: ESOPHAGOGASTRODUODENOSCOPY (EGD) WITH PROPOFOL;  Surgeon: Rogene Houston, MD;  Location: [facility];  Service: Endoscopy;  Laterality: N/A;   l carpal tunnel release Right    l spine fusion x 2     reconstucted right ankle     Right knee arthroscopic surgery     total reconstruction of right ankle and heel      total of three    VESICOVAGINAL FISTULA CLOSURE W/ TAH  1993   Fibroids no Ca     Social History:   reports that she quit smoking about 9 years ago. Her smoking use included cigarettes. She has a 12.00 pack-year smoking history. She has never used smokeless tobacco. She reports current drug use. She reports that she does not drink alcohol.   Family History:  Her family history includes Cancer in her father, maternal grandfather, and another family member; Diabetes in her mother; GI problems in her mother; Heart disease in her mother; Heart failure in her mother; Pancreatic cancer in her father; Thyroid disease in her sister.   Allergies Allergies  Allergen Reactions   Levofloxacin Shortness Of Breath and Itching    WHELPS WHELPS WHELPS WHELPS    Penicillins Other (See Comments)    Stroke-like symptoms Has patient had a PCN reaction causing immediate rash, facial/tongue/throat swelling, SOB or lightheadedness with hypotension: YES Has patient had a PCN reaction causing severe rash involving mucus membranes or skin necrosis: No Has patient had a PCN reaction that required hospitalization: No Has patient had a PCN reaction occurring within the last 10 years: Yes If all of the above answers are "NO", then may proceed with Cephalosporin use.    Prednisone Shortness Of Breath and Nausea And Vomiting   Cephalexin Hives, Swelling and Rash   Latex Rash and Other (See Comments)    Causes skin to tear easily   Metoclopramide Hcl Hives and Rash     Home Medications  Prior to Admission medications   Medication Sig Start Date End Date Taking? Authorizing Provider  acetaminophen (TYLENOL) 500 MG tablet Take 1,000 mg by mouth every 6 (six) hours as needed for mild pain.   Yes [provider]  ALPRAZolam Duanne Moron) 0.5 MG tablet Take 0.5 mg by mouth 3 (three) times daily as needed for  anxiety. 05/09/22  Yes [provider]  aspirin EC 81 MG tablet Take 81 mg by mouth daily.    Yes [provider]  HYDROmorphone (DILAUDID) 4 MG tablet Take 4 mg by mouth every 6 (six) hours as needed for moderate pain.   Yes [provider]  levothyroxine (SYNTHROID) 175 MCG tablet Take 175 mcg by mouth daily. 03/31/20  Yes [provider]  losartan (COZAAR) 25 MG tablet Take 25 mg by mouth daily. 05/16/21  Yes [provider]  montelukast (SINGULAIR) 10 MG tablet Take 10 mg by mouth at bedtime.   Yes [provider]  morphine (MS CONTIN) 15 MG 12 hr tablet Take 15 mg by mouth every 8 (eight) hours.   Yes [provider]  rosuvastatin (CRESTOR) 20 MG tablet Take 20 mg by mouth daily. 07/12/21  Yes [provider]  sertraline (ZOLOFT) 100 MG tablet Take 100 mg by mouth in the morning and at bedtime.   Yes [provider]  traZODone (DESYREL) 100 MG tablet Take 100-200 mg by mouth at bedtime as needed for sleep.   Yes [provider]  budesonide (PULMICORT) 0.25 MG/2ML nebulizer solution Take 2 mLs (0.25 mg total) by nebulization 2 (two) times daily. Patient not taking: Reported on 05/10/2022 07/19/20   Deatra James, MD  dicyclomine (BENTYL) 10 MG capsule TAKE 1 CAPSULE BY MOUTH TWICE DAILY. Patient not taking: Reported on 05/10/2022 09/03/14   Fayrene Helper, MD  fluticasone [facility]) 50 MCG/ACT nasal spray PLACE 2 SPRAYS INTO BOTH NOSTRILS DAILY Patient not taking: Reported on 05/10/2022 04/11/16   Fayrene Helper, MD  gabapentin (NEURONTIN) 300 MG capsule Take 1 capsule (300 mg total) by mouth 3 (three) times daily. 07/19/20 08/18/20  Shahmehdi, Valeria Batman, MD  ipratropium-albuterol (DUONEB) 0.5-2.5 (3) MG/3ML SOLN Take 3 mLs by nebulization every 6 (six) hours as needed. Patient not taking: Reported on 05/10/2022 05/31/20   Elgergawy, Silver Huguenin, MD  naloxone [facility]) nasal spray 4 mg/0.1 mL  06/01/20   [provider]  pantoprazole (PROTONIX) 40 MG tablet Take 40 mg by mouth 2 (two) times daily. Patient  not taking: Reported on 05/10/2022 05/20/20   [provider]  promethazine (PHENERGAN) 25 MG tablet Take 1 tablet (25 mg total) by mouth daily as needed for nausea or vomiting. Patient not taking: Reported on 05/10/2022 07/07/15   Fayrene Helper, MD     Signature:  Chesley Mires, MD [place] Pager - [phone] 05/10/2022, 2:01 PM

## 2022-05-10 NOTE — ED Notes (Signed)
Pt keeps removing bipap. Hospitalist asked to come and talk to pt

## 2022-05-10 NOTE — Progress Notes (Addendum)
Palliative: Thank you for this consult. Unfortunately due to high volume of consults there will be a delay in a Palliative Provider seeing this patient.  Palliative Medicine will return to service on 05/14/2022 and will see patient at that time. Conference with attending, bedside nursing staff, transition of care team.  No charge Quinn Axe, NP Palliative Medicine Please call Palliative Medicine team phone with any questions [phone]. For individual providers please see AMION.

## 2022-05-10 NOTE — Progress Notes (Signed)
Took patient off BIPAP per MD and placed on HFNC at 9 lpm. Patient tolerating well at this time and sats are 96%. Patient wears 8 lpm at home normally. Lab in room drawing blood work.

## 2022-05-10 NOTE — ED Notes (Signed)
RN to room. Pt 71% on HFNC. Patient bumped up to 15l on the HFNC and encouraged to breathe through her nose. Pt saturations eventually back up Refuses to be back on bipap.

## 2022-05-10 NOTE — ED Notes (Signed)
Pt keeps taking off bipap. Sats will drop to 58% on room air- pt wears 8l at home.Marland Kitchen able to get patient to put it back on. Educated on risks of not being adequately oxygenated.

## 2022-05-11 DIAGNOSIS — J9621 Acute and chronic respiratory failure with hypoxia: Secondary | ICD-10-CM | POA: Diagnosis not present

## 2022-05-11 DIAGNOSIS — D638 Anemia in other chronic diseases classified elsewhere: Secondary | ICD-10-CM | POA: Diagnosis not present

## 2022-05-11 DIAGNOSIS — J84112 Idiopathic pulmonary fibrosis: Secondary | ICD-10-CM | POA: Diagnosis not present

## 2022-05-11 LAB — GLUCOSE, CAPILLARY
Glucose-Capillary: 108 mg/dL — ABNORMAL HIGH (ref 70–99)
Glucose-Capillary: 137 mg/dL — ABNORMAL HIGH (ref 70–99)
Glucose-Capillary: 144 mg/dL — ABNORMAL HIGH (ref 70–99)
Glucose-Capillary: 127 mg/dL — ABNORMAL HIGH (ref 70–99)

## 2022-05-11 LAB — BASIC METABOLIC PANEL
Anion gap: 5 (ref 5–15)
BUN: 14 mg/dL (ref 8–23)
CO2: 28 mmol/L (ref 22–32)
Calcium: 8.9 mg/dL (ref 8.9–10.3)
Chloride: 103 mmol/L (ref 98–111)
Creatinine, Ser: 0.53 mg/dL (ref 0.44–1.00)
GFR, Estimated: >60 mL/min (ref >60)
Glucose, Bld: 157 mg/dL — ABNORMAL HIGH (ref 70–99)
Potassium: 4.1 mmol/L (ref 3.5–5.1)
Sodium: 136 mmol/L (ref 135–145)

## 2022-05-11 LAB — MAGNESIUM: Magnesium: 1.8 mg/dL (ref 1.7–2.4)

## 2022-05-11 MED ORDER — SALINE SPRAY 0.65 % NA SOLN
2.0000 | Freq: Two times a day (BID) | NASAL | Status: DC
  Administered 2022-05-11 – 2022-05-24 (×27): 2 via NASAL
  Filled 2022-05-11 (×3): qty 44

## 2022-05-11 MED ORDER — ONDANSETRON HCL 4 MG/2ML IJ SOLN
4.0000 mg | Freq: Four times a day (QID) | INTRAMUSCULAR | Status: DC | PRN
  Administered 2022-05-11 – 2022-05-24 (×23): 4 mg via INTRAVENOUS
  Filled 2022-05-11 (×25): qty 2

## 2022-05-11 MED ORDER — LACTATED RINGERS IV BOLUS
500.0000 mL | Freq: Once | INTRAVENOUS | Status: AC
  Administered 2022-05-11: 500 mL via INTRAVENOUS

## 2022-05-11 MED ORDER — LACTATED RINGERS IV BOLUS
1000.0000 mL | Freq: Once | INTRAVENOUS | Status: AC
  Administered 2022-05-11: 1000 mL via INTRAVENOUS

## 2022-05-11 NOTE — Progress Notes (Signed)
PROGRESS NOTE  [name] Fitzpatrick [id number] DOB: [birth year] DOA: 05/09/2022 PCP: Joya Gaskins, FNP  Brief History:  67 year old female with a history of chronic respiratory failure on 6 L, pulmonary fibrosis, diabetes mellitus type 2, anemia of chronic disease, anxiety, hypertension presenting with worsening cough and shortness of breath over the last 2 to 3 days.  The patient states that she has been having some headaches which began on 05/04/2022 followed by some emesis.  She states that her emesis has improved, unfortunately, her shortness of breath and coughing have worsened.  She has had some fevers at home up to 101.2 F.  She denies any hemoptysis, abdominal pain, diarrhea, hematochezia, melena.  She denies any focal extremity weakness or visual loss. She denies any worsening lower extremity edema. In the ED, the patient was afebrile with soft blood pressures in the 90s.  WBC 11.8, hemoglobin 10.2, platelets 222,000. BMP showed sodium 134, potassium 4.1, bicarbonate 26, BUN 20, creatinine 0.74.  LFTs unremarkable.  BNP 274.  CTA chest was negative for PE, negative dissection.  There is diffuse fibrotic changes with honeycombing.  There is a right upper lobe 4 mm nodule.  CTA abdomen pelvis was negative for any acute findings.  EKG shows sinus rhythm with incomplete right bundle branch block.  Troponin 11>>   Assessment/Plan:  Acute on chronic respiratory failure with hypoxia -Secondary to exacerbation of pulmonary fibrosis -Suspect underlying viral infection -Chronically on 6 L nasal cannula -On 15 L high flow>>wean for saturation 88-95% -05/09/2022 CTA chest negative for PE, consolidation -Pulmonary consult appreciated -Continue IV Solu-Medrol -added Yupelri -continue Pulmicort and Brovana -COVID-19 PCR negative, RSV negative, influenza negative -check PCT <0.10 -viral resp panel--neg  COPD exacerbation -continue IV solumedrol -continue Yupelri, brovana,  pulmicort   Anxiety disorder -Continue home dose alprazolam, Zoloft   Opioid dependence was -PDMP reviewed--patient receives morphine ER 15 mg, 90 monthly -Patient receives Dilaudid 4 mg, #120 monthly -Patient received Xanax #90, 0.5 mg   Hypothyroidism -Continue Synthroid   Diabetes mellitus type 2 -Appears to be managed by lifestyle modification -NovoLog sliding scale -Check A1c--5.3 -CBGs controlled  Mixed hyperlipidemia -continue statin   GOC -initially DNR -verbalized to me in am 05/10/22 she wanted full scope of care -consult palliative to continue discussion             Family Communication:   no Family at bedside   Consultants:  palliative, pulm   Code Status:  FULL   DVT Prophylaxis:  Benld Lovenox     Procedures: As Listed in Progress Note Above   Antibiotics: None     Subjective: Pt is breathing a little better.  Denies f/c, cp , n/v/d, abd pain  Objective: Vitals:   05/11/22 0800 05/11/22 0900 05/11/22 1230 05/11/22 1500  BP: (!) 103/40 (!) 100/46    Pulse:  66    Resp: (!) 21 (!) 27    Temp:    98.1 F (36.7 C)  TempSrc:    Oral  SpO2: 98% 90% 94%   Weight:      Height:        Intake/Output Summary (Last 24 hours) at 05/11/2022 1800 Last data filed at 05/11/2022 0900 Gross per 24 hour  Intake 890 ml  Output --  Net 890 ml   Weight change: 1.44 kg Exam:  General:  Pt is alert, follows commands appropriately, not in acute distress HEENT: No icterus, No thrush, No neck mass,  [place]/AT Cardiovascular: RRR, S1/S2, no rubs, no gallops Respiratory: bilateral rales.  No wheeze Abdomen: Soft/+BS, non tender, non distended, no guarding Extremities: No edema, No lymphangitis, No petechiae, No rashes, no synovitis   Data Reviewed: I have personally reviewed following labs and imaging studies Basic Metabolic Panel: Recent Labs  Lab 05/09/22 2201 05/10/22 0550 05/11/22 0502  NA 134* 132* 136  K 4.1 4.2 4.1  CL 98 99 103  CO2 '26 24  28  '$ GLUCOSE 110* 157* 157*  BUN '20 21 14  '$ CREATININE 0.74 0.66 0.53  CALCIUM 9.1 8.9 8.9  MG 1.8 1.8 1.8  PHOS  --  3.9  --    Liver Function Tests: Recent Labs  Lab 05/09/22 2201 05/10/22 0550  AST 18 18  ALT 13 13  ALKPHOS 57 58  BILITOT 0.4 0.5  PROT 6.7 6.8  ALBUMIN 3.6 3.5   Recent Labs  Lab 05/09/22 2201  LIPASE 25   No results for input(s): "AMMONIA" in the last 168 hours. Coagulation Profile: Recent Labs  Lab 05/09/22 2201  INR 1.0   CBC: Recent Labs  Lab 05/09/22 2201 05/10/22 0550  WBC 11.8* 6.7  NEUTROABS 9.5* 6.1  HGB 10.2* 10.1*  HCT 32.6* 31.6*  MCV 89.3 89.8  PLT 222 186   Cardiac Enzymes: No results for input(s): "CKTOTAL", "CKMB", "CKMBINDEX", "TROPONINI" in the last 168 hours. BNP: Invalid input(s): "POCBNP" CBG: Recent Labs  Lab 05/10/22 1630 05/10/22 2019 05/11/22 0855 05/11/22 1119 05/11/22 1549  GLUCAP 100* 127* 108* 137* 144*   HbA1C: Recent Labs    05/09/22 2201  HGBA1C 5.3   Urine analysis:    Component Value Date/Time   COLORURINE YELLOW 05/10/2022 1550   APPEARANCEUR HAZY (A) 05/10/2022 1550   LABSPEC >1.046 (H) 05/10/2022 1550   PHURINE 5.0 05/10/2022 1550   GLUCOSEU NEGATIVE 05/10/2022 1550   HGBUR NEGATIVE 05/10/2022 1550   HGBUR negative 05/17/2006 0000   BILIRUBINUR NEGATIVE 05/10/2022 1550   BILIRUBINUR small 03/30/2015 1546   KETONESUR 5 (A) 05/10/2022 1550   PROTEINUR 30 (A) 05/10/2022 1550   UROBILINOGEN 0.2 03/30/2015 1546   UROBILINOGEN 0.2 11/08/2013 0237   NITRITE NEGATIVE 05/10/2022 1550   LEUKOCYTESUR NEGATIVE 05/10/2022 1550   Sepsis Labs: '@LABRCNTIP'$ (procalcitonin:4,lacticidven:4) ) Recent Results (from the past 240 hour(s))  Resp panel by RT-PCR (RSV, Flu A&B, Covid) Anterior Nasal Swab     Status: None   Collection Time: 05/09/22  9:55 PM   Specimen: Anterior Nasal Swab  Result Value Ref Range Status   SARS Coronavirus 2 by RT PCR NEGATIVE NEGATIVE Final    Comment:  (NOTE) SARS-CoV-2 target nucleic acids are NOT DETECTED.  The SARS-CoV-2 RNA is generally detectable in upper respiratory specimens during the acute phase of infection. The lowest concentration of SARS-CoV-2 viral copies this assay can detect is 138 copies/mL. A negative result does not preclude SARS-Cov-2 infection and should not be used as the sole basis for treatment or other patient management decisions. A negative result may occur with  improper specimen collection/handling, submission of specimen other than nasopharyngeal swab, presence of viral mutation(s) within the areas targeted by this assay, and inadequate number of viral copies(<138 copies/mL). A negative result must be combined with clinical observations, patient history, and epidemiological information. The expected result is Negative.  Fact Sheet for Patients:  EntrepreneurPulse.com.au  Fact Sheet for Healthcare Providers:  IncredibleEmployment.be  This test is no t yet approved or cleared by the [place] and  has been authorized for  detection and/or diagnosis of SARS-CoV-2 by FDA under an Emergency Use Authorization (EUA). This EUA will remain  in effect (meaning this test can be used) for the duration of the COVID-19 declaration under Section 564(b)(1) of the Act, 21 U.S.C.section 360bbb-3(b)(1), unless the authorization is terminated  or revoked sooner.       Influenza A by PCR NEGATIVE NEGATIVE Final   Influenza B by PCR NEGATIVE NEGATIVE Final    Comment: (NOTE) The Xpert Xpress SARS-CoV-2/FLU/RSV plus assay is intended as an aid in the diagnosis of influenza from Nasopharyngeal swab specimens and should not be used as a sole basis for treatment. Nasal washings and aspirates are unacceptable for Xpert Xpress SARS-CoV-2/FLU/RSV testing.  Fact Sheet for Patients: EntrepreneurPulse.com.au  Fact Sheet for Healthcare  Providers: IncredibleEmployment.be  This test is not yet approved or cleared by the [place] FDA and has been authorized for detection and/or diagnosis of SARS-CoV-2 by FDA under an Emergency Use Authorization (EUA). This EUA will remain in effect (meaning this test can be used) for the duration of the COVID-19 declaration under Section 564(b)(1) of the Act, 21 U.S.C. section 360bbb-3(b)(1), unless the authorization is terminated or revoked.     Resp Syncytial Virus by PCR NEGATIVE NEGATIVE Final    Comment: (NOTE) Fact Sheet for Patients: EntrepreneurPulse.com.au  Fact Sheet for Healthcare Providers: IncredibleEmployment.be  This test is not yet approved or cleared by the [place] FDA and has been authorized for detection and/or diagnosis of SARS-CoV-2 by FDA under an Emergency Use Authorization (EUA). This EUA will remain in effect (meaning this test can be used) for the duration of the COVID-19 declaration under Section 564(b)(1) of the Act, 21 U.S.C. section 360bbb-3(b)(1), unless the authorization is terminated or revoked.  Performed at [facility], [address], [place], [place] [address]   MRSA Next Gen by PCR, Nasal     Status: None   Collection Time: 05/10/22  4:15 AM   Specimen: Nasal Mucosa; Nasal Swab  Result Value Ref Range Status   MRSA by PCR Next Gen NOT DETECTED NOT DETECTED Final    Comment: (NOTE) The GeneXpert MRSA Assay (FDA approved for NASAL specimens only), is one component of a comprehensive MRSA colonization surveillance program. It is not intended to diagnose MRSA infection nor to guide or monitor treatment for MRSA infections. Test performance is not FDA approved in patients less than 32 years old. Performed at [facility], [address], [place], [place] [address]   Culture, blood (Routine X 2) w Reflex to ID Panel     Status: None (Preliminary result)   Collection Time: 05/10/22   8:39 AM   Specimen: Right Antecubital; Blood  Result Value Ref Range Status   Specimen Description RIGHT ANTECUBITAL  Final   Special Requests   Final    BOTTLES DRAWN AEROBIC AND ANAEROBIC Blood Culture adequate volume   Culture   Final    NO GROWTH < 24 HOURS Performed at [facility], [address], [place], [place] [address]    Report Status PENDING  Incomplete  Culture, blood (Routine X 2) w Reflex to ID Panel     Status: None (Preliminary result)   Collection Time: 05/10/22  8:39 AM   Specimen: BLOOD RIGHT ARM  Result Value Ref Range Status   Specimen Description BLOOD RIGHT ARM  Final   Special Requests   Final    BOTTLES DRAWN AEROBIC AND ANAEROBIC Blood Culture results may not be optimal due to an excessive volume  of blood received in culture bottles   Culture   Final    NO GROWTH < 24 HOURS Performed at [facility], [address], [place], [place] [address]    Report Status PENDING  Incomplete  Respiratory (~20 pathogens) panel by PCR     Status: None   Collection Time: 05/10/22  9:30 AM   Specimen: Nasopharyngeal Swab; Respiratory  Result Value Ref Range Status   Adenovirus NOT DETECTED NOT DETECTED Final   Coronavirus 229E NOT DETECTED NOT DETECTED Final    Comment: (NOTE) The Coronavirus on the Respiratory Panel, DOES NOT test for the novel  Coronavirus (2019 nCoV)    Coronavirus HKU1 NOT DETECTED NOT DETECTED Final   Coronavirus NL63 NOT DETECTED NOT DETECTED Final   Coronavirus OC43 NOT DETECTED NOT DETECTED Final   Metapneumovirus NOT DETECTED NOT DETECTED Final   Rhinovirus / Enterovirus NOT DETECTED NOT DETECTED Final   Influenza A NOT DETECTED NOT DETECTED Final   Influenza B NOT DETECTED NOT DETECTED Final   Parainfluenza Virus 1 NOT DETECTED NOT DETECTED Final   Parainfluenza Virus 2 NOT DETECTED NOT DETECTED Final   Parainfluenza Virus 3 NOT DETECTED NOT DETECTED Final   Parainfluenza Virus 4 NOT DETECTED NOT DETECTED Final   Respiratory Syncytial  Virus NOT DETECTED NOT DETECTED Final   Bordetella pertussis NOT DETECTED NOT DETECTED Final   Bordetella Parapertussis NOT DETECTED NOT DETECTED Final   Chlamydophila pneumoniae NOT DETECTED NOT DETECTED Final   Mycoplasma pneumoniae NOT DETECTED NOT DETECTED Final    Comment: Performed at [facility], [place]. [address], [place], [place] [address]     Scheduled Meds:  ALPRAZolam  0.5 mg Oral TID   arformoterol  15 mcg Nebulization BID   aspirin EC  81 mg Oral Daily   budesonide (PULMICORT) nebulizer solution  0.5 mg Nebulization BID   Chlorhexidine Gluconate Cloth  6 each Topical Q0600   fluticasone  2 spray Each Nare Daily   guaiFENesin  1,200 mg Oral BID   insulin aspart  0-6 Units Subcutaneous TID WC   levothyroxine  175 mcg Oral Q0600   methylPREDNISolone (SOLU-MEDROL) injection  80 mg Intravenous Q12H   morphine  15 mg Oral Q12H   pantoprazole  40 mg Oral BID   revefenacin  175 mcg Nebulization Daily   rosuvastatin  20 mg Oral Daily   sertraline  100 mg Oral QHS   sodium chloride  2 spray Each Nare BID   Continuous Infusions:  Procedures/Studies: CT Angio Chest/Abd/Pel for Dissection W and/or Wo Contrast  Result Date: 05/09/2022 CLINICAL DATA:  Chest pain radiating into the neck, initial encounter EXAM: CT ANGIOGRAPHY CHEST, ABDOMEN AND PELVIS TECHNIQUE: Non-contrast CT of the chest was initially obtained. Multidetector CT imaging through the chest, abdomen and pelvis was performed using the standard protocol during bolus administration of intravenous contrast. Multiplanar reconstructed images and MIPs were obtained and reviewed to evaluate the vascular anatomy. RADIATION DOSE REDUCTION: This exam was performed according to the departmental dose-optimization program which includes automated exposure control, adjustment of the mA and/or kV according to patient size and/or use of iterative reconstruction technique. CONTRAST:  164m OMNIPAQUE IOHEXOL 350 MG/ML SOLN COMPARISON:   07/15/2020 FINDINGS: CTA CHEST FINDINGS Cardiovascular: Initial precontrast images demonstrate atherosclerotic calcification of the thoracic aorta. No aneurysmal dilatation is seen. No hyperdense crescent to suggest acute aortic injury is noted. Postcontrast images were then obtained and reveal no evidence of aortic dissection. Heart is not significantly enlarged. The pulmonary  artery shows a normal branching pattern bilaterally. No filling defect to suggest pulmonary embolism is noted. Coronary calcifications are noted. Mediastinum/Nodes: Thoracic inlet is within normal limits. Prominent AP window lymph node has resolved in the interval from the prior exam. Persistent right hilar lymph nodes are seen likely reactive in nature. The esophagus as visualized is within normal limits. Lungs/Pleura: Lungs demonstrate diffuse fibrotic changes and honeycombing particularly in the bases. The previously seen diffuse superimposed infiltrates have resolved. There is a 4 mm nodule in the right upper lobe on image number 26 of series 9. No other sizable nodules are seen. No effusions are noted. Musculoskeletal: Degenerative changes of the thoracic spine are noted. No acute rib abnormality is seen. Chronic compression deformities of T5 and T7 are noted. Review of the MIP images confirms the above findings. CTA ABDOMEN AND PELVIS FINDINGS VASCULAR Aorta: Atherosclerotic calcifications are noted without aneurysmal dilatation or dissection. Celiac: Patent without evidence of aneurysm, dissection, vasculitis or significant stenosis. SMA: Patent without evidence of aneurysm, dissection, vasculitis or significant stenosis. Renals: Both renal arteries are patent without evidence of aneurysm, dissection, vasculitis, fibromuscular dysplasia or significant stenosis. IMA: Patent without evidence of aneurysm, dissection, vasculitis or significant stenosis. Inflow: Iliacs demonstrate mild atherosclerotic calcification although no focal  stenosis is seen. Veins: Visualized venous structures appear within normal limits. IVC filter is noted in satisfactory position. Review of the MIP images confirms the above findings. NON-VASCULAR Hepatobiliary: Gallbladder has been surgically removed. Liver shows no focal abnormality. Pancreas: Unremarkable. No pancreatic ductal dilatation or surrounding inflammatory changes. Spleen: Normal in size without focal abnormality. Adrenals/Urinary Tract: Adrenal glands are within normal limits. Kidneys demonstrate a normal enhancement pattern bilaterally. No renal calculi or obstructive changes are seen. The bladder is partially distended. Stomach/Bowel: Fecal material is noted scattered throughout the colon. No obstructive or inflammatory changes are seen. The appendix is not well visualized and may have been surgically. No inflammatory changes are seen. The small bowel and stomach are unremarkable. Lymphatic: No lymphadenopathy is noted. Reproductive: Status post hysterectomy. No adnexal masses. Other: No abdominal wall hernia or abnormality. No abdominopelvic ascites. Musculoskeletal: Postsurgical changes are noted in the lower lumbar spine. Prior vertebral augmentation at L1 is seen. Chronic L3 compression fracture is noted. Review of the MIP images confirms the above findings. IMPRESSION: CTA of the chest: No evidence of aortic injury. No pulmonary embolism is seen. Persistent right hilar lymph nodes likely reactive in nature. Chronic fibrotic changes are noted in the lungs bilaterally similar to prior exam. 4 mm right solid pulmonary nodule within the upper lobe. Per Fleischner Society Guidelines, a non-contrast Chest CT at 12 months is optional. If performed and the nodule is stable at 12 months, no further follow-up is recommended. These guidelines do not apply to immunocompromised patients and patients with cancer. Follow up in patients with significant comorbidities as clinically warranted. For lung cancer  screening, adhere to Lung-RADS guidelines. Reference: Radiology. 2017; 284(1):228-43. CTA of the abdomen and pelvis: No aortic abnormality is noted. No acute abnormality is seen. Electronically Signed   By: Inez Catalina M.D.   On: 05/09/2022 23:40   CT Head Wo Contrast  Result Date: 05/09/2022 CLINICAL DATA:  New onset headache EXAM: CT HEAD WITHOUT CONTRAST TECHNIQUE: Contiguous axial images were obtained from the base of the skull through the vertex without intravenous contrast. RADIATION DOSE REDUCTION: This exam was performed according to the departmental dose-optimization program which includes automated exposure control, adjustment of the mA and/or kV according to patient  size and/or use of iterative reconstruction technique. COMPARISON:  None Available. FINDINGS: Brain: There is no mass, hemorrhage or extra-axial collection. The size and configuration of the ventricles and extra-axial CSF spaces are normal. The brain parenchyma is normal, without acute or chronic infarction. Vascular: No abnormal hyperdensity of the major intracranial arteries or dural venous sinuses. No intracranial atherosclerosis. Skull: The visualized skull base, calvarium and extracranial soft tissues are normal. Sinuses/Orbits: No fluid levels or advanced mucosal thickening of the visualized paranasal sinuses. No mastoid or middle ear effusion. The orbits are normal. IMPRESSION: Normal head CT. Electronically Signed   By: Ulyses Jarred M.D.   On: 05/09/2022 23:22   DG Chest Portable 1 View  Result Date: 05/09/2022 CLINICAL DATA:  Chest pain and shortness of breath EXAM: PORTABLE CHEST 1 VIEW COMPARISON:  07/15/2020 FINDINGS: Cardiac shadow is stable. Diffuse fibrotic changes are identified throughout both lungs. No focal infiltrate or sizable effusion is seen. No bony abnormality is noted. IMPRESSION: Changes consistent with pulmonary fibrosis without acute abnormality. Electronically Signed   By: Inez Catalina M.D.   On:  05/09/2022 22:03    Orson Eva, DO  [facility]  If 7PM-7AM, please contact night-coverage [email or web address] Password [email or web address] 05/11/2022, 6:00 PM   LOS: 1 day

## 2022-05-11 NOTE — Consult Note (Signed)
NAME:  [name] Fitzpatrick, MRN:  [id number], DOB:  1955-12-10, LOS: 1 ADMISSION DATE:  05/09/2022, CONSULTATION DATE:  05/10/2022 REFERRING MD:  Dr. Carles Collet, [facility] CHIEF COMPLAINT:  dypsnea   History of Present Illness:  67 yo female former smoker with hx of ARDS and resulting pulmonary fibrosis, and COPD presented to [facility] ER with worsening dyspnea and cough.  Six days prior to admission she had fever 102F, sinus pressure and headache, and nose bleed.  SpO2 was 88% on her baseline oxygen of 6 liters.  She was started on Bipap.  PCCM consulted to assist with respiratory management.  She was hospitalized in 2020 for pneumonia with ARDS.  She required intubation and tracheostomy.  She developed pulmonary fibrosis after this.  She normally uses 8 liters oxygen at home.  Her activity level is severely limited due to her breathing.  She was told by her previous medical team in [place] that she should consider hospice care.  Pertinent  Medical History  DVT, ARDS, COPD, Depression, DM type 2, Fibromyalgia, GERD, Hashimoto's thyroiditis, HTN, IBS, Migraine headache, Seizure, Chronic pain, Atrial fibrillation  Significant Hospital Events: Including procedures, antibiotic start and stop dates in addition to other pertinent events   2/21 Admit  Tests:  PFT 02/26/19 >> FEV1 1.15 (51%), FEV1% 81, DLCO 27% PFT 03/01/21 >> FEV1 1.28 (58%), FEV1% 81 Echo 07/20/21 >> EF 60 to 65%, mild LVH LHC 08/03/21 >> normal coronary angiogram CT angio chest 05/09/22 >> diffuse fibrotic changes and honeycombing more at bases, 4 mm nodule RUL  Interim History / Subjective:  Not as fidgety.  Gets winded and oxygen level drops with any activity (getting out of bed to use bedside commode).  Takes several minutes to recover.  Objective   Blood pressure (!) 100/46, pulse 66, temperature 97.7 F (36.5 C), temperature source Oral, resp. rate (!) 27, height '5\' 2"'$  (1.575 m), weight 59.5 kg, SpO2 94 %.        Intake/Output Summary  (Last 24 hours) at 05/11/2022 1413 Last data filed at 05/11/2022 0900 Gross per 24 hour  Intake 1130 ml  Output --  Net 1130 ml   Filed Weights   05/09/22 2125 05/11/22 0549  Weight: 58.1 kg 59.5 kg    Examination:  General - alert Eyes - pupils reactive ENT - no sinus tenderness, no stridor Cardiac - irregular Chest - bilateral crackles Abdomen - soft, non tender, + bowel sounds Extremities - no cyanosis, clubbing, or edema Skin - no rashes Neuro - normal strength, moves extremities, follows commands Psych - normal mood and behavior  Resolved Hospital Problem list     Assessment & Plan:   Acute on chronic hypoxic/hypercapnic respiratory failure likely from acute viral respiratory infection in setting of severe pulmonary fibrosis and COPD exacerbation. - goal SpO2 88 to 95% - prn Bipap - continue yupelri, brovana, pulmicort - prn albuterol - continue solumedrol 80 mg bid for now - mucinex, flutter valve - f/u CXR intermittently  Atrial fibrillation with PFO. - previously followed by cardiology at [facility]. Chronic pain. Hypothyroidism. DM type 2 poorly controlled with steroid induced hyperglycemia. Epistaxis. - per primary team  Goals of care. - had detailed discussion about severity of her lung disease  - advised her to discuss with her family regarding her wishes - at this time she would be okay to proceed with intubation if needed to see if this helps get her better - she is less certain about whether she would want  long term vent support or whether she would want CPR in the event of cardiac arrest - palliative care has been consulted  PCCM will follow up on Monday 05/13/22.  Call [facility] team if assistance needed over the weekend.  Best Practice (right click and "Reselect all SmartList Selections" daily)   Diet/type: Regular consistency (see orders) DVT prophylaxis: SCD GI prophylaxis: PPI Lines: N/A Foley:  N/A Code Status:  full code Last  date of multidisciplinary goals of care discussion '[x]'$   Labs       Latest Ref Rng & Units 05/11/2022    5:02 AM 05/10/2022    5:50 AM 05/09/2022   10:01 PM  CMP  Glucose 70 - 99 mg/dL 157  157  110   BUN 8 - 23 mg/dL '14  21  20   '$ Creatinine 0.44 - 1.00 mg/dL 0.53  0.66  0.74   Sodium 135 - 145 mmol/L 136  132  134   Potassium 3.5 - 5.1 mmol/L 4.1  4.2  4.1   Chloride 98 - 111 mmol/L 103  99  98   CO2 22 - 32 mmol/L '28  24  26   '$ Calcium 8.9 - 10.3 mg/dL 8.9  8.9  9.1   Total Protein 6.5 - 8.1 g/dL  6.8  6.7   Total Bilirubin 0.3 - 1.2 mg/dL  0.5  0.4   Alkaline Phos 38 - 126 U/L  58  57   AST 15 - 41 U/L  18  18   ALT 0 - 44 U/L  13  13        Latest Ref Rng & Units 05/10/2022    5:50 AM 05/09/2022   10:01 PM 07/17/2020    4:02 AM  CBC  WBC 4.0 - 10.5 K/uL 6.7  11.8  9.5   Hemoglobin 12.0 - 15.0 g/dL 10.1  10.2  9.3   Hematocrit 36.0 - 46.0 % 31.6  32.6  30.3   Platelets 150 - 400 K/uL 186  222  272     ABG    Component Value Date/Time   PHART 7.321 (L) 07/15/2020 2121   PCO2ART 54.1 (H) 07/15/2020 2121   PO2ART 106 07/15/2020 2121   HCO3 28.5 (H) 05/10/2022 0550   TCO2 32 05/25/2020 1315   ACIDBASEDEF 0.1 01/30/2010 2033   O2SAT 98.6 05/10/2022 0550    CBG (last 3)  Recent Labs    05/10/22 2019 05/11/22 0855 05/11/22 1119  GLUCAP 127* 108* 137*     Signature:  Chesley Mires, MD [facility] Pager - [phone] 05/11/2022, 2:13 PM

## 2022-05-12 DIAGNOSIS — F411 Generalized anxiety disorder: Secondary | ICD-10-CM | POA: Diagnosis not present

## 2022-05-12 DIAGNOSIS — J9621 Acute and chronic respiratory failure with hypoxia: Secondary | ICD-10-CM | POA: Diagnosis not present

## 2022-05-12 DIAGNOSIS — J84112 Idiopathic pulmonary fibrosis: Secondary | ICD-10-CM | POA: Diagnosis not present

## 2022-05-12 LAB — GLUCOSE, CAPILLARY
Glucose-Capillary: 125 mg/dL — ABNORMAL HIGH (ref 70–99)
Glucose-Capillary: 92 mg/dL (ref 70–99)
Glucose-Capillary: 121 mg/dL — ABNORMAL HIGH (ref 70–99)
Glucose-Capillary: 160 mg/dL — ABNORMAL HIGH (ref 70–99)
Glucose-Capillary: 167 mg/dL — ABNORMAL HIGH (ref 70–99)

## 2022-05-12 LAB — BASIC METABOLIC PANEL
Anion gap: 8 (ref 5–15)
BUN: 15 mg/dL (ref 8–23)
CO2: 28 mmol/L (ref 22–32)
Calcium: 8.8 mg/dL — ABNORMAL LOW (ref 8.9–10.3)
Chloride: 101 mmol/L (ref 98–111)
Creatinine, Ser: 0.62 mg/dL (ref 0.44–1.00)
GFR, Estimated: >60 mL/min (ref >60)
Glucose, Bld: 124 mg/dL — ABNORMAL HIGH (ref 70–99)
Potassium: 4.1 mmol/L (ref 3.5–5.1)
Sodium: 137 mmol/L (ref 135–145)

## 2022-05-12 LAB — MAGNESIUM: Magnesium: 1.8 mg/dL (ref 1.7–2.4)

## 2022-05-12 LAB — PHOSPHORUS: Phosphorus: 3.3 mg/dL (ref 2.5–4.6)

## 2022-05-12 LAB — CBC
HCT: 31.4 % — ABNORMAL LOW (ref 36.0–46.0)
Hemoglobin: 9.6 g/dL — ABNORMAL LOW (ref 12.0–15.0)
MCH: 28.5 pg (ref 26.0–34.0)
MCHC: 30.6 g/dL (ref 30.0–36.0)
MCV: 93.2 fL (ref 80.0–100.0)
Platelets: 196 10*3/uL (ref 150–400)
RBC: 3.37 MIL/uL — ABNORMAL LOW (ref 3.87–5.11)
RDW: 14.4 % (ref 11.5–15.5)
WBC: 10 10*3/uL (ref 4.0–10.5)
nRBC: 0 % (ref 0.0–0.2)

## 2022-05-12 MED ORDER — SENNOSIDES-DOCUSATE SODIUM 8.6-50 MG PO TABS
1.0000 | ORAL_TABLET | Freq: Every day | ORAL | Status: DC
  Administered 2022-05-12 – 2022-05-23 (×12): 1 via ORAL
  Filled 2022-05-12 (×12): qty 1

## 2022-05-12 MED ORDER — POLYETHYLENE GLYCOL 3350 17 G PO PACK
17.0000 g | PACK | Freq: Every day | ORAL | Status: DC | PRN
  Administered 2022-05-13 – 2022-05-22 (×6): 17 g via ORAL
  Filled 2022-05-12 (×7): qty 1

## 2022-05-12 MED ORDER — CLOTRIMAZOLE 10 MG MT TROC
10.0000 mg | Freq: Every day | OROMUCOSAL | Status: DC
  Administered 2022-05-12 – 2022-05-23 (×24): 10 mg via ORAL
  Filled 2022-05-12 (×66): qty 1

## 2022-05-12 MED ORDER — PHENOL 1.4 % MT LIQD
1.0000 | OROMUCOSAL | Status: DC | PRN
  Administered 2022-05-12 – 2022-05-22 (×3): 1 via OROMUCOSAL
  Filled 2022-05-12: qty 177

## 2022-05-12 NOTE — Progress Notes (Signed)
Patient c/o mouth and throat feeling sore making it difficult to eat. There are some white patchy spots at the back of the throat. Protocol orders for chloraseptic spray implemented. Dr tat made aware and also, ordered  clotrimazole troches.

## 2022-05-12 NOTE — Progress Notes (Signed)
PROGRESS NOTE  [name] Fitzpatrick [id number] DOB: 1955/08/31 DOA: 05/09/2022 PCP: Joya Gaskins, FNP  Brief History:  67 year old female with a history of chronic respiratory failure on 6 L, pulmonary fibrosis, diabetes mellitus type 2, anemia of chronic disease, anxiety, hypertension presenting with worsening cough and shortness of breath over the last 2 to 3 days.  The patient states that she has been having some headaches which began on 05/04/2022 followed by some emesis.  She states that her emesis has improved, unfortunately, her shortness of breath and coughing have worsened.  She has had some fevers at home up to 101.2 F.  She denies any hemoptysis, abdominal pain, diarrhea, hematochezia, melena.  She denies any focal extremity weakness or visual loss. She denies any worsening lower extremity edema. In the ED, the patient was afebrile with soft blood pressures in the 90s.  WBC 11.8, hemoglobin 10.2, platelets 222,000. BMP showed sodium 134, potassium 4.1, bicarbonate 26, BUN 20, creatinine 0.74.  LFTs unremarkable.  BNP 274.  CTA chest was negative for PE, negative dissection.  There is diffuse fibrotic changes with honeycombing.  There is a right upper lobe 4 mm nodule.  CTA abdomen pelvis was negative for any acute findings.  EKG shows sinus rhythm with incomplete right bundle branch block.  Troponin 11>>   Assessment/Plan: Acute on chronic respiratory failure with hypoxia -Secondary to exacerbation of pulmonary fibrosis -Suspect underlying viral infection -Chronically on 6 L nasal cannula at home -On 15 L high flow>>wean for saturation 88-95% -05/09/2022 CTA chest negative for PE, consolidation -Pulmonary consult appreciated -Continue IV Solu-Medrol -added Yupelri -continue Pulmicort and Brovana -COVID-19 PCR negative, RSV negative, influenza negative -check PCT <0.10 -viral resp panel--neg   COPD exacerbation -continue IV solumedrol -continue Yupelri,  brovana, pulmicort   Anxiety disorder -Continue home dose alprazolam, Zoloft -continue ativan prn   Opioid dependence was -PDMP reviewed--patient receives morphine ER 15 mg, 90 monthly -Patient receives Dilaudid 4 mg, #120 monthly -Patient received Xanax #90, 0.5 mg   Hypothyroidism -Continue Synthroid   Diabetes mellitus type 2 -Appears to be managed by lifestyle modification -NovoLog sliding scale -Check A1c--5.3 -CBGs controlled   Mixed hyperlipidemia -continue statin   GOC -initially DNR -verbalized to me in am 05/10/22 she wanted full scope of care -consult palliative to continue discussion             Family Communication:   no Family at bedside   Consultants:  palliative, pulm   Code Status:  FULL   DVT Prophylaxis:  [place] Lovenox     Procedures: As Listed in Progress Note Above   Antibiotics: None           Subjective: Patient states her breathing/sob is a little better.  Denies f/c, n/v/d, abd pain  Objective: Vitals:   05/12/22 1235 05/12/22 1255 05/12/22 1300 05/12/22 1642  BP: (!) 149/64  (!) 151/64   Pulse: 73 92 69 64  Resp: (!) 24 (!) '22 20 14  '$ Temp:    97.6 F (36.4 C)  TempSrc:    Oral  SpO2: 95% (!) 87% 97% 100%  Weight:      Height:        Intake/Output Summary (Last 24 hours) at 05/12/2022 1649 Last data filed at 05/12/2022 1413 Gross per 24 hour  Intake 360 ml  Output 3 ml  Net 357 ml   Weight change: 0.4 kg Exam:  General:  Pt is alert, follows  commands appropriately, not in acute distress HEENT: No icterus, No thrush, No neck mass, [place]/AT Cardiovascular: RRR, S1/S2, no rubs, no gallops Respiratory: bilateral crackles. No wheeze Abdomen: Soft/+BS, non tender, non distended, no guarding Extremities: trace LE edema, No lymphangitis, No petechiae, No rashes, no synovitis   Data Reviewed: I have personally reviewed following labs and imaging studies Basic Metabolic Panel: Recent Labs  Lab 05/09/22 2201  05/10/22 0550 05/11/22 0502 05/12/22 0219  NA 134* 132* 136 137  K 4.1 4.2 4.1 4.1  CL 98 99 103 101  CO2 '26 24 28 28  '$ GLUCOSE 110* 157* 157* 124*  BUN '20 21 14 15  '$ CREATININE 0.74 0.66 0.53 0.62  CALCIUM 9.1 8.9 8.9 8.8*  MG 1.8 1.8 1.8 1.8  PHOS  --  3.9  --  3.3   Liver Function Tests: Recent Labs  Lab 05/09/22 2201 05/10/22 0550  AST 18 18  ALT 13 13  ALKPHOS 57 58  BILITOT 0.4 0.5  PROT 6.7 6.8  ALBUMIN 3.6 3.5   Recent Labs  Lab 05/09/22 2201  LIPASE 25   No results for input(s): "AMMONIA" in the last 168 hours. Coagulation Profile: Recent Labs  Lab 05/09/22 2201  INR 1.0   CBC: Recent Labs  Lab 05/09/22 2201 05/10/22 0550 05/12/22 0422  WBC 11.8* 6.7 10.0  NEUTROABS 9.5* 6.1  --   HGB 10.2* 10.1* 9.6*  HCT 32.6* 31.6* 31.4*  MCV 89.3 89.8 93.2  PLT 222 186 196   Cardiac Enzymes: No results for input(s): "CKTOTAL", "CKMB", "CKMBINDEX", "TROPONINI" in the last 168 hours. BNP: Invalid input(s): "POCBNP" CBG: Recent Labs  Lab 05/11/22 2112 05/12/22 0102 05/12/22 0744 05/12/22 1134 05/12/22 1641  GLUCAP 127* 125* 92 121* 160*   HbA1C: Recent Labs    05/09/22 2201  HGBA1C 5.3   Urine analysis:    Component Value Date/Time   COLORURINE YELLOW 05/10/2022 1550   APPEARANCEUR HAZY (A) 05/10/2022 1550   LABSPEC >1.046 (H) 05/10/2022 1550   PHURINE 5.0 05/10/2022 1550   GLUCOSEU NEGATIVE 05/10/2022 1550   HGBUR NEGATIVE 05/10/2022 1550   HGBUR negative 05/17/2006 0000   BILIRUBINUR NEGATIVE 05/10/2022 1550   BILIRUBINUR small 03/30/2015 1546   KETONESUR 5 (A) 05/10/2022 1550   PROTEINUR 30 (A) 05/10/2022 1550   UROBILINOGEN 0.2 03/30/2015 1546   UROBILINOGEN 0.2 11/08/2013 0237   NITRITE NEGATIVE 05/10/2022 1550   LEUKOCYTESUR NEGATIVE 05/10/2022 1550   Sepsis Labs: '@LABRCNTIP'$ (procalcitonin:4,lacticidven:4) ) Recent Results (from the past 240 hour(s))  Resp panel by RT-PCR (RSV, Flu A&B, Covid) Anterior Nasal Swab     Status:  None   Collection Time: 05/09/22  9:55 PM   Specimen: Anterior Nasal Swab  Result Value Ref Range Status   SARS Coronavirus 2 by RT PCR NEGATIVE NEGATIVE Final    Comment: (NOTE) SARS-CoV-2 target nucleic acids are NOT DETECTED.  The SARS-CoV-2 RNA is generally detectable in upper respiratory specimens during the acute phase of infection. The lowest concentration of SARS-CoV-2 viral copies this assay can detect is 138 copies/mL. A negative result does not preclude SARS-Cov-2 infection and should not be used as the sole basis for treatment or other patient management decisions. A negative result may occur with  improper specimen collection/handling, submission of specimen other than nasopharyngeal swab, presence of viral mutation(s) within the areas targeted by this assay, and inadequate number of viral copies(<138 copies/mL). A negative result must be combined with clinical observations, patient history, and epidemiological information. The expected result is Negative.  Fact Sheet for Patients:  EntrepreneurPulse.com.au  Fact Sheet for Healthcare Providers:  IncredibleEmployment.be  This test is no t yet approved or cleared by the [place] FDA and  has been authorized for detection and/or diagnosis of SARS-CoV-2 by FDA under an Emergency Use Authorization (EUA). This EUA will remain  in effect (meaning this test can be used) for the duration of the COVID-19 declaration under Section 564(b)(1) of the Act, 21 U.S.C.section 360bbb-3(b)(1), unless the authorization is terminated  or revoked sooner.       Influenza A by PCR NEGATIVE NEGATIVE Final   Influenza B by PCR NEGATIVE NEGATIVE Final    Comment: (NOTE) The Xpert Xpress SARS-CoV-2/FLU/RSV plus assay is intended as an aid in the diagnosis of influenza from Nasopharyngeal swab specimens and should not be used as a sole basis for treatment. Nasal washings and aspirates are unacceptable  for Xpert Xpress SARS-CoV-2/FLU/RSV testing.  Fact Sheet for Patients: EntrepreneurPulse.com.au  Fact Sheet for Healthcare Providers: IncredibleEmployment.be  This test is not yet approved or cleared by the [place] FDA and has been authorized for detection and/or diagnosis of SARS-CoV-2 by FDA under an Emergency Use Authorization (EUA). This EUA will remain in effect (meaning this test can be used) for the duration of the COVID-19 declaration under Section 564(b)(1) of the Act, 21 U.S.C. section 360bbb-3(b)(1), unless the authorization is terminated or revoked.     Resp Syncytial Virus by PCR NEGATIVE NEGATIVE Final    Comment: (NOTE) Fact Sheet for Patients: EntrepreneurPulse.com.au  Fact Sheet for Healthcare Providers: IncredibleEmployment.be  This test is not yet approved or cleared by the [place] FDA and has been authorized for detection and/or diagnosis of SARS-CoV-2 by FDA under an Emergency Use Authorization (EUA). This EUA will remain in effect (meaning this test can be used) for the duration of the COVID-19 declaration under Section 564(b)(1) of the Act, 21 U.S.C. section 360bbb-3(b)(1), unless the authorization is terminated or revoked.  Performed at [facility], [address], [place], [place] [address]   MRSA Next Gen by PCR, Nasal     Status: None   Collection Time: 05/10/22  4:15 AM   Specimen: Nasal Mucosa; Nasal Swab  Result Value Ref Range Status   MRSA by PCR Next Gen NOT DETECTED NOT DETECTED Final    Comment: (NOTE) The GeneXpert MRSA Assay (FDA approved for NASAL specimens only), is one component of a comprehensive MRSA colonization surveillance program. It is not intended to diagnose MRSA infection nor to guide or monitor treatment for MRSA infections. Test performance is not FDA approved in patients less than 39 years old. Performed at [facility], [address], [place], [place] [address]   Culture, blood (Routine X 2) w Reflex to ID Panel     Status: None (Preliminary result)   Collection Time: 05/10/22  8:39 AM   Specimen: Right Antecubital; Blood  Result Value Ref Range Status   Specimen Description RIGHT ANTECUBITAL  Final   Special Requests   Final    BOTTLES DRAWN AEROBIC AND ANAEROBIC Blood Culture adequate volume   Culture   Final    NO GROWTH 2 DAYS Performed at [facility], [address] [place], [place] [address]    Report Status PENDING  Incomplete  Culture, blood (Routine X 2) w Reflex to ID Panel     Status: None (Preliminary result)   Collection Time: 05/10/22  8:39 AM   Specimen: BLOOD RIGHT ARM  Result Value Ref Range Status  Specimen Description BLOOD RIGHT ARM  Final   Special Requests   Final    BOTTLES DRAWN AEROBIC AND ANAEROBIC Blood Culture results may not be optimal due to an excessive volume of blood received in culture bottles   Culture   Final    NO GROWTH 2 DAYS Performed at [facility], [address], [place], [place] [address]    Report Status PENDING  Incomplete  Respiratory (~20 pathogens) panel by PCR     Status: None   Collection Time: 05/10/22  9:30 AM   Specimen: Nasopharyngeal Swab; Respiratory  Result Value Ref Range Status   Adenovirus NOT DETECTED NOT DETECTED Final   Coronavirus 229E NOT DETECTED NOT DETECTED Final    Comment: (NOTE) The Coronavirus on the Respiratory Panel, DOES NOT test for the novel  Coronavirus (2019 nCoV)    Coronavirus HKU1 NOT DETECTED NOT DETECTED Final   Coronavirus NL63 NOT DETECTED NOT DETECTED Final   Coronavirus OC43 NOT DETECTED NOT DETECTED Final   Metapneumovirus NOT DETECTED NOT DETECTED Final   Rhinovirus / Enterovirus NOT DETECTED NOT DETECTED Final   Influenza A NOT DETECTED NOT DETECTED Final   Influenza B NOT DETECTED NOT DETECTED Final   Parainfluenza Virus 1 NOT DETECTED NOT DETECTED Final   Parainfluenza Virus 2 NOT DETECTED NOT DETECTED Final    Parainfluenza Virus 3 NOT DETECTED NOT DETECTED Final   Parainfluenza Virus 4 NOT DETECTED NOT DETECTED Final   Respiratory Syncytial Virus NOT DETECTED NOT DETECTED Final   Bordetella pertussis NOT DETECTED NOT DETECTED Final   Bordetella Parapertussis NOT DETECTED NOT DETECTED Final   Chlamydophila pneumoniae NOT DETECTED NOT DETECTED Final   Mycoplasma pneumoniae NOT DETECTED NOT DETECTED Final    Comment: Performed at [facility], [place]. [address] [place], [place] [address]     Scheduled Meds:  ALPRAZolam  0.5 mg Oral TID   arformoterol  15 mcg Nebulization BID   aspirin EC  81 mg Oral Daily   budesonide (PULMICORT) nebulizer solution  0.5 mg Nebulization BID   Chlorhexidine Gluconate Cloth  6 each Topical Q0600   clotrimazole  10 mg Oral 5 X Daily   fluticasone  2 spray Each Nare Daily   guaiFENesin  1,200 mg Oral BID   insulin aspart  0-6 Units Subcutaneous TID WC   levothyroxine  175 mcg Oral Q0600   methylPREDNISolone (SOLU-MEDROL) injection  80 mg Intravenous Q12H   morphine  15 mg Oral Q12H   pantoprazole  40 mg Oral BID   revefenacin  175 mcg Nebulization Daily   rosuvastatin  20 mg Oral Daily   sertraline  100 mg Oral QHS   sodium chloride  2 spray Each Nare BID   Continuous Infusions:  Procedures/Studies: CT Angio Chest/Abd/Pel for Dissection W and/or Wo Contrast  Result Date: 05/09/2022 CLINICAL DATA:  Chest pain radiating into the neck, initial encounter EXAM: CT ANGIOGRAPHY CHEST, ABDOMEN AND PELVIS TECHNIQUE: Non-contrast CT of the chest was initially obtained. Multidetector CT imaging through the chest, abdomen and pelvis was performed using the standard protocol during bolus administration of intravenous contrast. Multiplanar reconstructed images and MIPs were obtained and reviewed to evaluate the vascular anatomy. RADIATION DOSE REDUCTION: This exam was performed according to the departmental dose-optimization program which includes automated exposure  control, adjustment of the mA and/or kV according to patient size and/or use of iterative reconstruction technique. CONTRAST:  139m OMNIPAQUE IOHEXOL 350 MG/ML SOLN COMPARISON:  07/15/2020 FINDINGS: CTA CHEST FINDINGS Cardiovascular: Initial  precontrast images demonstrate atherosclerotic calcification of the thoracic aorta. No aneurysmal dilatation is seen. No hyperdense crescent to suggest acute aortic injury is noted. Postcontrast images were then obtained and reveal no evidence of aortic dissection. Heart is not significantly enlarged. The pulmonary artery shows a normal branching pattern bilaterally. No filling defect to suggest pulmonary embolism is noted. Coronary calcifications are noted. Mediastinum/Nodes: Thoracic inlet is within normal limits. Prominent AP window lymph node has resolved in the interval from the prior exam. Persistent right hilar lymph nodes are seen likely reactive in nature. The esophagus as visualized is within normal limits. Lungs/Pleura: Lungs demonstrate diffuse fibrotic changes and honeycombing particularly in the bases. The previously seen diffuse superimposed infiltrates have resolved. There is a 4 mm nodule in the right upper lobe on image number 26 of series 9. No other sizable nodules are seen. No effusions are noted. Musculoskeletal: Degenerative changes of the thoracic spine are noted. No acute rib abnormality is seen. Chronic compression deformities of T5 and T7 are noted. Review of the MIP images confirms the above findings. CTA ABDOMEN AND PELVIS FINDINGS VASCULAR Aorta: Atherosclerotic calcifications are noted without aneurysmal dilatation or dissection. Celiac: Patent without evidence of aneurysm, dissection, vasculitis or significant stenosis. SMA: Patent without evidence of aneurysm, dissection, vasculitis or significant stenosis. Renals: Both renal arteries are patent without evidence of aneurysm, dissection, vasculitis, fibromuscular dysplasia or significant  stenosis. IMA: Patent without evidence of aneurysm, dissection, vasculitis or significant stenosis. Inflow: Iliacs demonstrate mild atherosclerotic calcification although no focal stenosis is seen. Veins: Visualized venous structures appear within normal limits. IVC filter is noted in satisfactory position. Review of the MIP images confirms the above findings. NON-VASCULAR Hepatobiliary: Gallbladder has been surgically removed. Liver shows no focal abnormality. Pancreas: Unremarkable. No pancreatic ductal dilatation or surrounding inflammatory changes. Spleen: Normal in size without focal abnormality. Adrenals/Urinary Tract: Adrenal glands are within normal limits. Kidneys demonstrate a normal enhancement pattern bilaterally. No renal calculi or obstructive changes are seen. The bladder is partially distended. Stomach/Bowel: Fecal material is noted scattered throughout the colon. No obstructive or inflammatory changes are seen. The appendix is not well visualized and may have been surgically. No inflammatory changes are seen. The small bowel and stomach are unremarkable. Lymphatic: No lymphadenopathy is noted. Reproductive: Status post hysterectomy. No adnexal masses. Other: No abdominal wall hernia or abnormality. No abdominopelvic ascites. Musculoskeletal: Postsurgical changes are noted in the lower lumbar spine. Prior vertebral augmentation at L1 is seen. Chronic L3 compression fracture is noted. Review of the MIP images confirms the above findings. IMPRESSION: CTA of the chest: No evidence of aortic injury. No pulmonary embolism is seen. Persistent right hilar lymph nodes likely reactive in nature. Chronic fibrotic changes are noted in the lungs bilaterally similar to prior exam. 4 mm right solid pulmonary nodule within the upper lobe. Per Fleischner Society Guidelines, a non-contrast Chest CT at 12 months is optional. If performed and the nodule is stable at 12 months, no further follow-up is recommended. These  guidelines do not apply to immunocompromised patients and patients with cancer. Follow up in patients with significant comorbidities as clinically warranted. For lung cancer screening, adhere to Lung-RADS guidelines. Reference: Radiology. 2017; 284(1):228-43. CTA of the abdomen and pelvis: No aortic abnormality is noted. No acute abnormality is seen. Electronically Signed   By: Inez Catalina M.D.   On: 05/09/2022 23:40   CT Head Wo Contrast  Result Date: 05/09/2022 CLINICAL DATA:  New onset headache EXAM: CT HEAD WITHOUT CONTRAST TECHNIQUE: Contiguous  axial images were obtained from the base of the skull through the vertex without intravenous contrast. RADIATION DOSE REDUCTION: This exam was performed according to the departmental dose-optimization program which includes automated exposure control, adjustment of the mA and/or kV according to patient size and/or use of iterative reconstruction technique. COMPARISON:  None Available. FINDINGS: Brain: There is no mass, hemorrhage or extra-axial collection. The size and configuration of the ventricles and extra-axial CSF spaces are normal. The brain parenchyma is normal, without acute or chronic infarction. Vascular: No abnormal hyperdensity of the major intracranial arteries or dural venous sinuses. No intracranial atherosclerosis. Skull: The visualized skull base, calvarium and extracranial soft tissues are normal. Sinuses/Orbits: No fluid levels or advanced mucosal thickening of the visualized paranasal sinuses. No mastoid or middle ear effusion. The orbits are normal. IMPRESSION: Normal head CT. Electronically Signed   By: Ulyses Jarred M.D.   On: 05/09/2022 23:22   DG Chest Portable 1 View  Result Date: 05/09/2022 CLINICAL DATA:  Chest pain and shortness of breath EXAM: PORTABLE CHEST 1 VIEW COMPARISON:  07/15/2020 FINDINGS: Cardiac shadow is stable. Diffuse fibrotic changes are identified throughout both lungs. No focal infiltrate or sizable effusion is  seen. No bony abnormality is noted. IMPRESSION: Changes consistent with pulmonary fibrosis without acute abnormality. Electronically Signed   By: Inez Catalina M.D.   On: 05/09/2022 22:03    Orson Eva, DO  [facility]  If 7PM-7AM, please contact night-coverage [email or web address] Password [facility] 05/12/2022, 4:49 PM   LOS: 2 days

## 2022-05-13 ENCOUNTER — Inpatient Hospital Stay (HOSPITAL_COMMUNITY): Payer: Medicare HMO

## 2022-05-13 DIAGNOSIS — R079 Chest pain, unspecified: Secondary | ICD-10-CM

## 2022-05-13 DIAGNOSIS — F411 Generalized anxiety disorder: Secondary | ICD-10-CM | POA: Diagnosis not present

## 2022-05-13 DIAGNOSIS — R0602 Shortness of breath: Secondary | ICD-10-CM

## 2022-05-13 DIAGNOSIS — J9621 Acute and chronic respiratory failure with hypoxia: Secondary | ICD-10-CM | POA: Diagnosis not present

## 2022-05-13 DIAGNOSIS — J84112 Idiopathic pulmonary fibrosis: Secondary | ICD-10-CM | POA: Diagnosis not present

## 2022-05-13 LAB — COMPREHENSIVE METABOLIC PANEL
ALT: 15 U/L (ref 0–44)
AST: 16 U/L (ref 15–41)
Albumin: 2.9 g/dL — ABNORMAL LOW (ref 3.5–5.0)
Alkaline Phosphatase: 46 U/L (ref 38–126)
Anion gap: 8 (ref 5–15)
BUN: 16 mg/dL (ref 8–23)
CO2: 29 mmol/L (ref 22–32)
Calcium: 8.6 mg/dL — ABNORMAL LOW (ref 8.9–10.3)
Chloride: 97 mmol/L — ABNORMAL LOW (ref 98–111)
Creatinine, Ser: 0.55 mg/dL (ref 0.44–1.00)
GFR, Estimated: >60 mL/min (ref >60)
Glucose, Bld: 160 mg/dL — ABNORMAL HIGH (ref 70–99)
Potassium: 3.9 mmol/L (ref 3.5–5.1)
Sodium: 134 mmol/L — ABNORMAL LOW (ref 135–145)
Total Bilirubin: 0.4 mg/dL (ref 0.3–1.2)
Total Protein: 5.7 g/dL — ABNORMAL LOW (ref 6.5–8.1)

## 2022-05-13 LAB — BLOOD GAS, ARTERIAL
Acid-Base Excess: 9.3 mmol/L — ABNORMAL HIGH (ref 0.0–2.0)
Bicarbonate: 36.1 mmol/L — ABNORMAL HIGH (ref 20.0–28.0)
FIO2: 100 %
O2 Saturation: 99.9 %
Patient temperature: 37
pCO2 arterial: 61 mmHg — ABNORMAL HIGH (ref 32–48)
pH, Arterial: 7.38 (ref 7.35–7.45)
pO2, Arterial: 145 mmHg — ABNORMAL HIGH (ref 83–108)

## 2022-05-13 LAB — CBC
HCT: 31.6 % — ABNORMAL LOW (ref 36.0–46.0)
Hemoglobin: 9.9 g/dL — ABNORMAL LOW (ref 12.0–15.0)
MCH: 28.2 pg (ref 26.0–34.0)
MCHC: 31.3 g/dL (ref 30.0–36.0)
MCV: 90 fL (ref 80.0–100.0)
Platelets: 207 10*3/uL (ref 150–400)
RBC: 3.51 MIL/uL — ABNORMAL LOW (ref 3.87–5.11)
RDW: 14.2 % (ref 11.5–15.5)
WBC: 10.5 10*3/uL (ref 4.0–10.5)
nRBC: 0 % (ref 0.0–0.2)

## 2022-05-13 LAB — BRAIN NATRIURETIC PEPTIDE: B Natriuretic Peptide: 338 pg/mL — ABNORMAL HIGH (ref 0.0–100.0)

## 2022-05-13 LAB — GLUCOSE, CAPILLARY
Glucose-Capillary: 126 mg/dL — ABNORMAL HIGH (ref 70–99)
Glucose-Capillary: 172 mg/dL — ABNORMAL HIGH (ref 70–99)
Glucose-Capillary: 130 mg/dL — ABNORMAL HIGH (ref 70–99)
Glucose-Capillary: 152 mg/dL — ABNORMAL HIGH (ref 70–99)

## 2022-05-13 LAB — MAGNESIUM: Magnesium: 1.7 mg/dL (ref 1.7–2.4)

## 2022-05-13 LAB — ECHOCARDIOGRAM COMPLETE
Area-P 1/2: 5.38 cm2
Calc EF: 64.5 %
Height: 62 in
S' Lateral: 2.1 cm
Single Plane A2C EF: 65.7 %
Single Plane A4C EF: 63.9 %
Weight: 2162.27 oz

## 2022-05-13 LAB — TROPONIN I (HIGH SENSITIVITY)
Troponin I (High Sensitivity): 2 ng/L (ref <18)
Troponin I (High Sensitivity): 3 ng/L (ref <18)

## 2022-05-13 LAB — PHOSPHORUS: Phosphorus: 3 mg/dL (ref 2.5–4.6)

## 2022-05-13 MED ORDER — HYDROCODONE BIT-HOMATROP MBR 5-1.5 MG/5ML PO SOLN
5.0000 mL | ORAL | Status: DC | PRN
  Administered 2022-05-13 – 2022-05-23 (×17): 5 mL via ORAL
  Filled 2022-05-13 (×17): qty 5

## 2022-05-13 MED ORDER — MORPHINE SULFATE (PF) 2 MG/ML IV SOLN
1.0000 mg | Freq: Once | INTRAVENOUS | Status: AC
  Administered 2022-05-13: 1 mg via INTRAVENOUS
  Filled 2022-05-13: qty 1

## 2022-05-13 MED ORDER — MORPHINE SULFATE (PF) 2 MG/ML IV SOLN
1.0000 mg | INTRAVENOUS | Status: AC
  Administered 2022-05-13: 1 mg via INTRAVENOUS

## 2022-05-13 MED ORDER — ORAL CARE MOUTH RINSE
15.0000 mL | OROMUCOSAL | Status: DC
  Administered 2022-05-13 – 2022-05-24 (×37): 15 mL via OROMUCOSAL

## 2022-05-13 MED ORDER — ORAL CARE MOUTH RINSE: 15.0000 mL | OROMUCOSAL | Status: DC | PRN

## 2022-05-13 MED ORDER — MORPHINE SULFATE (PF) 2 MG/ML IV SOLN
INTRAVENOUS | Status: AC
  Filled 2022-05-13: qty 1

## 2022-05-13 MED ORDER — NITROGLYCERIN 0.4 MG SL SUBL: 0.4000 mg | SUBLINGUAL_TABLET | SUBLINGUAL | Status: DC | PRN

## 2022-05-13 MED ORDER — NITROGLYCERIN 0.4 MG SL SUBL
SUBLINGUAL_TABLET | SUBLINGUAL | Status: AC
  Filled 2022-05-13: qty 1

## 2022-05-13 MED ORDER — NITROGLYCERIN 0.4 MG SL SUBL
0.4000 mg | SUBLINGUAL_TABLET | SUBLINGUAL | Status: DC | PRN
  Administered 2022-05-13: 0.4 mg via SUBLINGUAL

## 2022-05-13 NOTE — Progress Notes (Signed)
Called to patient room by dietary stating the patient needed help. On assessment patient was in respiratory distress with HFNC at 13L. Patient struggled to get out the words "its my chest, it just hurts so bad". NRB applied at 15L. Chest pain protocol implemented. Patient was given her PRN dose of Ativan as she was too worked up. Provider made aware. Respiratory responded after being called for assistance. Dr. Carles Collet came to the unit and verbally ordered morphine and ABG before placing many orders himself. Patient is now calm, in no apparent distress with HFNC at 13 and NRB on. Also, patient stated she has not had a bowel movement since Wednesday 05/09/2022. Patient was given senna last night, miralaax and prune juice this morning with no BM yet. MD is also aware of this. Will continue to monitor and endorse.

## 2022-05-13 NOTE — Progress Notes (Signed)
Patient had another respiratory distress episode. Dr.Tat on the unit and wrote new orders. Patient is doing much netter now. Patient consumed 100% of dinner. Patient continues to require both HFNC and NRB at the same time. No BM at this time. Currently stating, "I feel more relaxed". Will continue to monitor and endorse.

## 2022-05-13 NOTE — Progress Notes (Addendum)
PROGRESS NOTE  [name] Fitzpatrick [id number] DOB: Jan 02, 1956 DOA: 05/09/2022 PCP: Joya Gaskins, FNP  Brief History:  67 year old female with a history of chronic respiratory failure on 6 L, pulmonary fibrosis, diabetes mellitus type 2, anemia of chronic disease, anxiety, hypertension presenting with worsening cough and shortness of breath over the last 2 to 3 days.  The patient states that she has been having some headaches which began on 05/04/2022 followed by some emesis.  She states that her emesis has improved, unfortunately, her shortness of breath and coughing have worsened.  She has had some fevers at home up to 101.2 F.  She denies any hemoptysis, abdominal pain, diarrhea, hematochezia, melena.  She denies any focal extremity weakness or visual loss. She denies any worsening lower extremity edema. In the ED, the patient was afebrile with soft blood pressures in the 90s.  WBC 11.8, hemoglobin 10.2, platelets 222,000. BMP showed sodium 134, potassium 4.1, bicarbonate 26, BUN 20, creatinine 0.74.  LFTs unremarkable.  BNP 274.  CTA chest was negative for PE, negative dissection.  There is diffuse fibrotic changes with honeycombing.  There is a right upper lobe 4 mm nodule.  CTA abdomen pelvis was negative for any acute findings.  EKG shows sinus rhythm with incomplete right bundle branch block.  Troponin 11>>   Assessment/Plan: Acute on chronic respiratory failure with hypoxia -Secondary to exacerbation of pulmonary fibrosis -Suspect underlying viral infection -Chronically on 6 L nasal cannula at home -On 15 L high flow>>wean for saturation 88-95% -05/09/2022 CTA chest negative for PE, consolidation -Pulmonary consult appreciated -Continue IV Solu-Medrol -added Yupelri -continue Pulmicort and Brovana -COVID-19 PCR negative, RSV negative, influenza negative -check PCT <0.10 -viral resp panel--neg 2/25 AM--had episode respiratory distress with CP --repeat  CXR --troponin x2 --ABG --morphine IV 1 mg x 1 --CMP, CBC --add guaifensin   COPD exacerbation -continue IV solumedrol -continue Yupelri, brovana, pulmicort   Anxiety disorder -Continue home dose alprazolam, Zoloft -continue ativan prn   Opioid dependence was -PDMP reviewed--patient receives morphine ER 15 mg, 90 monthly -Patient receives Dilaudid 4 mg, #120 monthly -Patient received Xanax #90, 0.5 mg   Hypothyroidism -Continue Synthroid   Diabetes mellitus type 2 -Appears to be managed by lifestyle modification -NovoLog sliding scale -Check A1c--5.3 -CBGs controlled   Mixed hyperlipidemia -continue statin   GOC -initially DNR -verbalized to me in am 05/10/22 she wanted full scope of care -consult palliative to continue discussion             Family Communication:   no Family at bedside   Consultants:  palliative, pulm   Code Status:  FULL   DVT Prophylaxis:  [place] Lovenox     Procedures: As Listed in Progress Note Above   Antibiotics: None   The patient is critically ill with multiple organ systems failure and requires high complexity decision making for assessment and support, frequent evaluation and titration of therapies, application of advanced monitoring technologies and extensive interpretation of multiple databases.  Critical care time - 35 mins.    Subjective: Pt complains of constipation.  Complains sob, cp, n/v/d.  No hemoptysis  Objective: Vitals:   05/13/22 0500 05/13/22 0600 05/13/22 0744 05/13/22 1100  BP: (!) 147/49 138/66    Pulse: (!) 54 67 61   Resp: 17 18 (!) 22   Temp:   97.8 F (36.6 C) 97.6 F (36.4 C)  TempSrc:   Oral Axillary  SpO2: 94% 98% 100%  Weight: 61.3 kg     Height:        Intake/Output Summary (Last 24 hours) at 05/13/2022 1223 Last data filed at 05/13/2022 0930 Gross per 24 hour  Intake 360 ml  Output 901 ml  Net -541 ml   Weight change: 1.4 kg Exam:  General:  Pt is alert, follows commands  appropriately, not in acute distress HEENT: No icterus, No thrush, No neck mass, [place]/AT Cardiovascular: RRR, S1/S2, no rubs, no gallops Respiratory: bilateral crackle. Minimal basilar wheeze.  Good air movement bilateral Abdomen: Soft/+BS, non tender, non distended, no guarding Extremities: No edema, No lymphangitis, No petechiae, No rashes, no synovitis   Data Reviewed: I have personally reviewed following labs and imaging studies Basic Metabolic Panel: Recent Labs  Lab 05/09/22 2201 05/10/22 0550 05/11/22 0502 05/12/22 0219  NA 134* 132* 136 137  K 4.1 4.2 4.1 4.1  CL 98 99 103 101  CO2 '26 24 28 28  '$ GLUCOSE 110* 157* 157* 124*  BUN '20 21 14 15  '$ CREATININE 0.74 0.66 0.53 0.62  CALCIUM 9.1 8.9 8.9 8.8*  MG 1.8 1.8 1.8 1.8  PHOS  --  3.9  --  3.3   Liver Function Tests: Recent Labs  Lab 05/09/22 2201 05/10/22 0550  AST 18 18  ALT 13 13  ALKPHOS 57 58  BILITOT 0.4 0.5  PROT 6.7 6.8  ALBUMIN 3.6 3.5   Recent Labs  Lab 05/09/22 2201  LIPASE 25   No results for input(s): "AMMONIA" in the last 168 hours. Coagulation Profile: Recent Labs  Lab 05/09/22 2201  INR 1.0   CBC: Recent Labs  Lab 05/09/22 2201 05/10/22 0550 05/12/22 0422  WBC 11.8* 6.7 10.0  NEUTROABS 9.5* 6.1  --   HGB 10.2* 10.1* 9.6*  HCT 32.6* 31.6* 31.4*  MCV 89.3 89.8 93.2  PLT 222 186 196   Cardiac Enzymes: No results for input(s): "CKTOTAL", "CKMB", "CKMBINDEX", "TROPONINI" in the last 168 hours. BNP: Invalid input(s): "POCBNP" CBG: Recent Labs  Lab 05/12/22 1134 05/12/22 1641 05/12/22 2026 05/13/22 0746 05/13/22 1124  GLUCAP 121* 160* 167* 126* 172*   HbA1C: No results for input(s): "HGBA1C" in the last 72 hours. Urine analysis:    Component Value Date/Time   COLORURINE YELLOW 05/10/2022 1550   APPEARANCEUR HAZY (A) 05/10/2022 1550   LABSPEC >1.046 (H) 05/10/2022 1550   PHURINE 5.0 05/10/2022 1550   GLUCOSEU NEGATIVE 05/10/2022 1550   HGBUR NEGATIVE 05/10/2022 1550    HGBUR negative 05/17/2006 0000   BILIRUBINUR NEGATIVE 05/10/2022 1550   BILIRUBINUR small 03/30/2015 1546   KETONESUR 5 (A) 05/10/2022 1550   PROTEINUR 30 (A) 05/10/2022 1550   UROBILINOGEN 0.2 03/30/2015 1546   UROBILINOGEN 0.2 11/08/2013 0237   NITRITE NEGATIVE 05/10/2022 1550   LEUKOCYTESUR NEGATIVE 05/10/2022 1550   Sepsis Labs: '@LABRCNTIP'$ (procalcitonin:4,lacticidven:4) ) Recent Results (from the past 240 hour(s))  Resp panel by RT-PCR (RSV, Flu A&B, Covid) Anterior Nasal Swab     Status: None   Collection Time: 05/09/22  9:55 PM   Specimen: Anterior Nasal Swab  Result Value Ref Range Status   SARS Coronavirus 2 by RT PCR NEGATIVE NEGATIVE Final    Comment: (NOTE) SARS-CoV-2 target nucleic acids are NOT DETECTED.  The SARS-CoV-2 RNA is generally detectable in upper respiratory specimens during the acute phase of infection. The lowest concentration of SARS-CoV-2 viral copies this assay can detect is 138 copies/mL. A negative result does not preclude SARS-Cov-2 infection and should not be used as the sole  basis for treatment or other patient management decisions. A negative result may occur with  improper specimen collection/handling, submission of specimen other than nasopharyngeal swab, presence of viral mutation(s) within the areas targeted by this assay, and inadequate number of viral copies(<138 copies/mL). A negative result must be combined with clinical observations, patient history, and epidemiological information. The expected result is Negative.  Fact Sheet for Patients:  EntrepreneurPulse.com.au  Fact Sheet for Healthcare Providers:  IncredibleEmployment.be  This test is no t yet approved or cleared by the [place] FDA and  has been authorized for detection and/or diagnosis of SARS-CoV-2 by FDA under an Emergency Use Authorization (EUA). This EUA will remain  in effect (meaning this test can be used) for the duration of  the COVID-19 declaration under Section 564(b)(1) of the Act, 21 U.S.C.section 360bbb-3(b)(1), unless the authorization is terminated  or revoked sooner.       Influenza A by PCR NEGATIVE NEGATIVE Final   Influenza B by PCR NEGATIVE NEGATIVE Final    Comment: (NOTE) The Xpert Xpress SARS-CoV-2/FLU/RSV plus assay is intended as an aid in the diagnosis of influenza from Nasopharyngeal swab specimens and should not be used as a sole basis for treatment. Nasal washings and aspirates are unacceptable for Xpert Xpress SARS-CoV-2/FLU/RSV testing.  Fact Sheet for Patients: EntrepreneurPulse.com.au  Fact Sheet for Healthcare Providers: IncredibleEmployment.be  This test is not yet approved or cleared by the [place] FDA and has been authorized for detection and/or diagnosis of SARS-CoV-2 by FDA under an Emergency Use Authorization (EUA). This EUA will remain in effect (meaning this test can be used) for the duration of the COVID-19 declaration under Section 564(b)(1) of the Act, 21 U.S.C. section 360bbb-3(b)(1), unless the authorization is terminated or revoked.     Resp Syncytial Virus by PCR NEGATIVE NEGATIVE Final    Comment: (NOTE) Fact Sheet for Patients: EntrepreneurPulse.com.au  Fact Sheet for Healthcare Providers: IncredibleEmployment.be  This test is not yet approved or cleared by the [place] FDA and has been authorized for detection and/or diagnosis of SARS-CoV-2 by FDA under an Emergency Use Authorization (EUA). This EUA will remain in effect (meaning this test can be used) for the duration of the COVID-19 declaration under Section 564(b)(1) of the Act, 21 U.S.C. section 360bbb-3(b)(1), unless the authorization is terminated or revoked.  Performed at [facility], [address] [place], [place] [address]   MRSA Next Gen by PCR, Nasal     Status: None   Collection Time: 05/10/22  4:15  AM   Specimen: Nasal Mucosa; Nasal Swab  Result Value Ref Range Status   MRSA by PCR Next Gen NOT DETECTED NOT DETECTED Final    Comment: (NOTE) The GeneXpert MRSA Assay (FDA approved for NASAL specimens only), is one component of a comprehensive MRSA colonization surveillance program. It is not intended to diagnose MRSA infection nor to guide or monitor treatment for MRSA infections. Test performance is not FDA approved in patients less than 40 years old. Performed at [facility], [address] [place], [place] [address]   Culture, blood (Routine X 2) w Reflex to ID Panel     Status: None (Preliminary result)   Collection Time: 05/10/22  8:39 AM   Specimen: Right Antecubital; Blood  Result Value Ref Range Status   Specimen Description RIGHT ANTECUBITAL  Final   Special Requests   Final    BOTTLES DRAWN AEROBIC AND ANAEROBIC Blood Culture adequate volume   Culture   Final    NO  GROWTH 3 DAYS Performed at [facility], [address] [place], [place] [address]    Report Status PENDING  Incomplete  Culture, blood (Routine X 2) w Reflex to ID Panel     Status: None (Preliminary result)   Collection Time: 05/10/22  8:39 AM   Specimen: BLOOD RIGHT ARM  Result Value Ref Range Status   Specimen Description BLOOD RIGHT ARM  Final   Special Requests   Final    BOTTLES DRAWN AEROBIC AND ANAEROBIC Blood Culture results may not be optimal due to an excessive volume of blood received in culture bottles   Culture   Final    NO GROWTH 3 DAYS Performed at [facility], [address] [place], [place] [address]    Report Status PENDING  Incomplete  Respiratory (~20 pathogens) panel by PCR     Status: None   Collection Time: 05/10/22  9:30 AM   Specimen: Nasopharyngeal Swab; Respiratory  Result Value Ref Range Status   Adenovirus NOT DETECTED NOT DETECTED Final   Coronavirus 229E NOT DETECTED NOT DETECTED Final    Comment: (NOTE) The Coronavirus on the Respiratory Panel, DOES NOT test for  the novel  Coronavirus (2019 nCoV)    Coronavirus HKU1 NOT DETECTED NOT DETECTED Final   Coronavirus NL63 NOT DETECTED NOT DETECTED Final   Coronavirus OC43 NOT DETECTED NOT DETECTED Final   Metapneumovirus NOT DETECTED NOT DETECTED Final   Rhinovirus / Enterovirus NOT DETECTED NOT DETECTED Final   Influenza A NOT DETECTED NOT DETECTED Final   Influenza B NOT DETECTED NOT DETECTED Final   Parainfluenza Virus 1 NOT DETECTED NOT DETECTED Final   Parainfluenza Virus 2 NOT DETECTED NOT DETECTED Final   Parainfluenza Virus 3 NOT DETECTED NOT DETECTED Final   Parainfluenza Virus 4 NOT DETECTED NOT DETECTED Final   Respiratory Syncytial Virus NOT DETECTED NOT DETECTED Final   Bordetella pertussis NOT DETECTED NOT DETECTED Final   Bordetella Parapertussis NOT DETECTED NOT DETECTED Final   Chlamydophila pneumoniae NOT DETECTED NOT DETECTED Final   Mycoplasma pneumoniae NOT DETECTED NOT DETECTED Final    Comment: Performed at [facility], [place]. [address] [place], [place] [address]     Scheduled Meds:  morphine (PF)       ALPRAZolam  0.5 mg Oral TID   arformoterol  15 mcg Nebulization BID   aspirin EC  81 mg Oral Daily   budesonide (PULMICORT) nebulizer solution  0.5 mg Nebulization BID   Chlorhexidine Gluconate Cloth  6 each Topical Q0600   clotrimazole  10 mg Oral 5 X Daily   fluticasone  2 spray Each Nare Daily   guaiFENesin  1,200 mg Oral BID   insulin aspart  0-6 Units Subcutaneous TID WC   levothyroxine  175 mcg Oral Q0600   methylPREDNISolone (SOLU-MEDROL) injection  80 mg Intravenous Q12H   morphine  15 mg Oral Q12H   mouth rinse  15 mL Mouth Rinse 4 times per day   pantoprazole  40 mg Oral BID   revefenacin  175 mcg Nebulization Daily   rosuvastatin  20 mg Oral Daily   senna-docusate  1 tablet Oral QHS   sertraline  100 mg Oral QHS   sodium chloride  2 spray Each Nare BID   Continuous Infusions:  Procedures/Studies: CT Angio Chest/Abd/Pel for Dissection W and/or Wo  Contrast  Result Date: 05/09/2022 CLINICAL DATA:  Chest pain radiating into the neck, initial encounter EXAM: CT ANGIOGRAPHY CHEST, ABDOMEN AND PELVIS TECHNIQUE: Non-contrast CT  of the chest was initially obtained. Multidetector CT imaging through the chest, abdomen and pelvis was performed using the standard protocol during bolus administration of intravenous contrast. Multiplanar reconstructed images and MIPs were obtained and reviewed to evaluate the vascular anatomy. RADIATION DOSE REDUCTION: This exam was performed according to the departmental dose-optimization program which includes automated exposure control, adjustment of the mA and/or kV according to patient size and/or use of iterative reconstruction technique. CONTRAST:  131m OMNIPAQUE IOHEXOL 350 MG/ML SOLN COMPARISON:  07/15/2020 FINDINGS: CTA CHEST FINDINGS Cardiovascular: Initial precontrast images demonstrate atherosclerotic calcification of the thoracic aorta. No aneurysmal dilatation is seen. No hyperdense crescent to suggest acute aortic injury is noted. Postcontrast images were then obtained and reveal no evidence of aortic dissection. Heart is not significantly enlarged. The pulmonary artery shows a normal branching pattern bilaterally. No filling defect to suggest pulmonary embolism is noted. Coronary calcifications are noted. Mediastinum/Nodes: Thoracic inlet is within normal limits. Prominent AP window lymph node has resolved in the interval from the prior exam. Persistent right hilar lymph nodes are seen likely reactive in nature. The esophagus as visualized is within normal limits. Lungs/Pleura: Lungs demonstrate diffuse fibrotic changes and honeycombing particularly in the bases. The previously seen diffuse superimposed infiltrates have resolved. There is a 4 mm nodule in the right upper lobe on image number 26 of series 9. No other sizable nodules are seen. No effusions are noted. Musculoskeletal: Degenerative changes of the thoracic  spine are noted. No acute rib abnormality is seen. Chronic compression deformities of T5 and T7 are noted. Review of the MIP images confirms the above findings. CTA ABDOMEN AND PELVIS FINDINGS VASCULAR Aorta: Atherosclerotic calcifications are noted without aneurysmal dilatation or dissection. Celiac: Patent without evidence of aneurysm, dissection, vasculitis or significant stenosis. SMA: Patent without evidence of aneurysm, dissection, vasculitis or significant stenosis. Renals: Both renal arteries are patent without evidence of aneurysm, dissection, vasculitis, fibromuscular dysplasia or significant stenosis. IMA: Patent without evidence of aneurysm, dissection, vasculitis or significant stenosis. Inflow: Iliacs demonstrate mild atherosclerotic calcification although no focal stenosis is seen. Veins: Visualized venous structures appear within normal limits. IVC filter is noted in satisfactory position. Review of the MIP images confirms the above findings. NON-VASCULAR Hepatobiliary: Gallbladder has been surgically removed. Liver shows no focal abnormality. Pancreas: Unremarkable. No pancreatic ductal dilatation or surrounding inflammatory changes. Spleen: Normal in size without focal abnormality. Adrenals/Urinary Tract: Adrenal glands are within normal limits. Kidneys demonstrate a normal enhancement pattern bilaterally. No renal calculi or obstructive changes are seen. The bladder is partially distended. Stomach/Bowel: Fecal material is noted scattered throughout the colon. No obstructive or inflammatory changes are seen. The appendix is not well visualized and may have been surgically. No inflammatory changes are seen. The small bowel and stomach are unremarkable. Lymphatic: No lymphadenopathy is noted. Reproductive: Status post hysterectomy. No adnexal masses. Other: No abdominal wall hernia or abnormality. No abdominopelvic ascites. Musculoskeletal: Postsurgical changes are noted in the lower lumbar spine.  Prior vertebral augmentation at L1 is seen. Chronic L3 compression fracture is noted. Review of the MIP images confirms the above findings. IMPRESSION: CTA of the chest: No evidence of aortic injury. No pulmonary embolism is seen. Persistent right hilar lymph nodes likely reactive in nature. Chronic fibrotic changes are noted in the lungs bilaterally similar to prior exam. 4 mm right solid pulmonary nodule within the upper lobe. Per Fleischner Society Guidelines, a non-contrast Chest CT at 12 months is optional. If performed and the nodule is stable at 12 months,  no further follow-up is recommended. These guidelines do not apply to immunocompromised patients and patients with cancer. Follow up in patients with significant comorbidities as clinically warranted. For lung cancer screening, adhere to Lung-RADS guidelines. Reference: Radiology. 2017; 284(1):228-43. CTA of the abdomen and pelvis: No aortic abnormality is noted. No acute abnormality is seen. Electronically Signed   By: Inez Catalina M.D.   On: 05/09/2022 23:40   CT Head Wo Contrast  Result Date: 05/09/2022 CLINICAL DATA:  New onset headache EXAM: CT HEAD WITHOUT CONTRAST TECHNIQUE: Contiguous axial images were obtained from the base of the skull through the vertex without intravenous contrast. RADIATION DOSE REDUCTION: This exam was performed according to the departmental dose-optimization program which includes automated exposure control, adjustment of the mA and/or kV according to patient size and/or use of iterative reconstruction technique. COMPARISON:  None Available. FINDINGS: Brain: There is no mass, hemorrhage or extra-axial collection. The size and configuration of the ventricles and extra-axial CSF spaces are normal. The brain parenchyma is normal, without acute or chronic infarction. Vascular: No abnormal hyperdensity of the major intracranial arteries or dural venous sinuses. No intracranial atherosclerosis. Skull: The visualized skull base,  calvarium and extracranial soft tissues are normal. Sinuses/Orbits: No fluid levels or advanced mucosal thickening of the visualized paranasal sinuses. No mastoid or middle ear effusion. The orbits are normal. IMPRESSION: Normal head CT. Electronically Signed   By: Ulyses Jarred M.D.   On: 05/09/2022 23:22   DG Chest Portable 1 View  Result Date: 05/09/2022 CLINICAL DATA:  Chest pain and shortness of breath EXAM: PORTABLE CHEST 1 VIEW COMPARISON:  07/15/2020 FINDINGS: Cardiac shadow is stable. Diffuse fibrotic changes are identified throughout both lungs. No focal infiltrate or sizable effusion is seen. No bony abnormality is noted. IMPRESSION: Changes consistent with pulmonary fibrosis without acute abnormality. Electronically Signed   By: Inez Catalina M.D.   On: 05/09/2022 22:03    Orson Eva, DO  [facility]  If 7PM-7AM, please contact night-coverage [email or web address] Password [facility] 05/13/2022, 12:23 PM   LOS: 3 days

## 2022-05-13 NOTE — Progress Notes (Signed)
  Echocardiogram 2D Echocardiogram has been performed.  Bobbye Charleston 05/13/2022, 2:04 PM

## 2022-05-14 ENCOUNTER — Encounter (HOSPITAL_COMMUNITY): Payer: Self-pay | Admitting: Internal Medicine

## 2022-05-14 DIAGNOSIS — Z7189 Other specified counseling: Secondary | ICD-10-CM

## 2022-05-14 DIAGNOSIS — J84112 Idiopathic pulmonary fibrosis: Secondary | ICD-10-CM | POA: Diagnosis not present

## 2022-05-14 DIAGNOSIS — Z515 Encounter for palliative care: Secondary | ICD-10-CM | POA: Diagnosis not present

## 2022-05-14 DIAGNOSIS — J9621 Acute and chronic respiratory failure with hypoxia: Secondary | ICD-10-CM | POA: Diagnosis not present

## 2022-05-14 DIAGNOSIS — F411 Generalized anxiety disorder: Secondary | ICD-10-CM | POA: Diagnosis not present

## 2022-05-14 LAB — GLUCOSE, CAPILLARY
Glucose-Capillary: 128 mg/dL — ABNORMAL HIGH (ref 70–99)
Glucose-Capillary: 129 mg/dL — ABNORMAL HIGH (ref 70–99)
Glucose-Capillary: 156 mg/dL — ABNORMAL HIGH (ref 70–99)
Glucose-Capillary: 134 mg/dL — ABNORMAL HIGH (ref 70–99)

## 2022-05-14 MED ORDER — PROMETHAZINE HCL 25 MG/ML IJ SOLN
INTRAMUSCULAR | Status: AC
  Filled 2022-05-14: qty 1

## 2022-05-14 MED ORDER — SODIUM CHLORIDE 0.9 % IV SOLN
12.5000 mg | Freq: Once | INTRAVENOUS | Status: AC | PRN
  Administered 2022-05-14: 12.5 mg via INTRAVENOUS
  Filled 2022-05-14: qty 0.5

## 2022-05-14 NOTE — Progress Notes (Signed)
Per night shift RT patient refused to wear BIPAP. Patient on 12L HFNC at this time with sats of 100%.

## 2022-05-14 NOTE — Progress Notes (Signed)
Patient has a very bad  nausea and some vomiting too, gave her the PRN Zofran IV '4mg'$ , got worse over time, MD made aware, have her smell the Alcohol wipes, said she felt little better with that, still nauseas, got orders for phenargan, will continue to monitor.

## 2022-05-14 NOTE — Progress Notes (Signed)
PROGRESS NOTE  [name] Fitzpatrick [id number] DOB: 10/10/1955 DOA: 05/09/2022 PCP: Joya Gaskins, FNP  Brief History:  67 year old female with a history of chronic respiratory failure on 6 L, pulmonary fibrosis, diabetes mellitus type 2, anemia of chronic disease, anxiety, hypertension presenting with worsening cough and shortness of breath over the last 2 to 3 days.  The patient states that she has been having some headaches which began on 05/04/2022 followed by some emesis.  She states that her emesis has improved, unfortunately, her shortness of breath and coughing have worsened.  She has had some fevers at home up to 101.2 F.  She denies any hemoptysis, abdominal pain, diarrhea, hematochezia, melena.  She denies any focal extremity weakness or visual loss. She denies any worsening lower extremity edema. In the ED, the patient was afebrile with soft blood pressures in the 90s.  WBC 11.8, hemoglobin 10.2, platelets 222,000. BMP showed sodium 134, potassium 4.1, bicarbonate 26, BUN 20, creatinine 0.74.  LFTs unremarkable.  BNP 274.  CTA chest was negative for PE, negative dissection.  There is diffuse fibrotic changes with honeycombing.  There is a right upper lobe 4 mm nodule.  CTA abdomen pelvis was negative for any acute findings.  EKG shows sinus rhythm with incomplete right bundle branch block.  Troponin 11>>   Assessment/Plan: Acute on chronic respiratory failure with hypoxia -Secondary to exacerbation of pulmonary fibrosis -Suspect underlying viral infection -Chronically on 6 L nasal cannula at home -On 15 L high flow>>wean for saturation 88-95% -05/09/2022 CTA chest negative for PE, consolidation -Pulmonary consult appreciated -Continue IV Solu-Medrol -added Yupelri -continue Pulmicort and Brovana -COVID-19 PCR negative, RSV negative, influenza negative -check PCT <0.10 -viral resp panel--neg 2/25 AM--had episode respiratory distress with CP --repeat  CXR--unchanged chronic interstitial change--personally reviewed --troponins 2>>3 --ABG 7.38/61/145/36 --morphine IV 1 mg x 1 --add guaifensin, hycodan --anxiety/panic attacks driving resp distress episodes --2/25 Echo--EF 60-65%, no WMA, normal RVF, trivial MR   COPD exacerbation -continue IV solumedrol -continue Yupelri, brovana, pulmicort   Anxiety disorder -Continue home dose alprazolam, Zoloft -continue ativan prn   Opioid dependence was -PDMP reviewed--patient receives morphine ER 15 mg, 90 monthly -Patient receives Dilaudid 4 mg, #120 monthly -Patient received Xanax #90, 0.5 mg   Hypothyroidism -Continue Synthroid   Diabetes mellitus type 2 -Appears to be managed by lifestyle modification -NovoLog sliding scale -Check A1c--5.3 -CBGs controlled   Mixed hyperlipidemia -continue statin   GOC -initially DNR -verbalized to me in am 05/10/22 she wanted full scope of care -consult palliative to continue discussion             Family Communication:   no Family at bedside   Consultants:  palliative, pulm   Code Status:  FULL   DVT Prophylaxis:  [place] Lovenox     Procedures: As Listed in Progress Note Above   Antibiotics: None           Subjective: Overall breathing better today.  Denies cp, n/v/d, abd pain, f/c  Objective: Vitals:   05/14/22 0900 05/14/22 0930 05/14/22 1134 05/14/22 1654  BP: 122/63     Pulse: 61     Resp: 14     Temp:   97.6 F (36.4 C) 98.3 F (36.8 C)  TempSrc:   Oral Oral  SpO2: (!) 73% 100%    Weight:      Height:        Intake/Output Summary (Last 24 hours) at 05/14/2022  [place] filed at 05/14/2022 1444 Gross per 24 hour  Intake 360 ml  Output 1200 ml  Net -840 ml   Weight change: 1 kg Exam:  General:  Pt is alert, follows commands appropriately, not in acute distress HEENT: No icterus, No thrush, No neck mass, [place]/AT Cardiovascular: RRR, S1/S2, no rubs, no gallops Respiratory: bilateral crackles. No  wheeze Abdomen: Soft/+BS, non tender, non distended, no guarding Extremities: No edema, No lymphangitis, No petechiae, No rashes, no synovitis   Data Reviewed: I have personally reviewed following labs and imaging studies Basic Metabolic Panel: Recent Labs  Lab 05/09/22 2201 05/10/22 0550 05/11/22 0502 05/12/22 0219 05/13/22 1245  NA 134* 132* 136 137 134*  K 4.1 4.2 4.1 4.1 3.9  CL 98 99 103 101 97*  CO2 '26 24 28 28 29  '$ GLUCOSE 110* 157* 157* 124* 160*  BUN '20 21 14 15 16  '$ CREATININE 0.74 0.66 0.53 0.62 0.55  CALCIUM 9.1 8.9 8.9 8.8* 8.6*  MG 1.8 1.8 1.8 1.8 1.7  PHOS  --  3.9  --  3.3 3.0   Liver Function Tests: Recent Labs  Lab 05/09/22 2201 05/10/22 0550 05/13/22 1245  AST '18 18 16  '$ ALT '13 13 15  '$ ALKPHOS 57 58 46  BILITOT 0.4 0.5 0.4  PROT 6.7 6.8 5.7*  ALBUMIN 3.6 3.5 2.9*   Recent Labs  Lab 05/09/22 2201  LIPASE 25   No results for input(s): "AMMONIA" in the last 168 hours. Coagulation Profile: Recent Labs  Lab 05/09/22 2201  INR 1.0   CBC: Recent Labs  Lab 05/09/22 2201 05/10/22 0550 05/12/22 0422 05/13/22 1245  WBC 11.8* 6.7 10.0 10.5  NEUTROABS 9.5* 6.1  --   --   HGB 10.2* 10.1* 9.6* 9.9*  HCT 32.6* 31.6* 31.4* 31.6*  MCV 89.3 89.8 93.2 90.0  PLT 222 186 196 207   Cardiac Enzymes: No results for input(s): "CKTOTAL", "CKMB", "CKMBINDEX", "TROPONINI" in the last 168 hours. BNP: Invalid input(s): "POCBNP" CBG: Recent Labs  Lab 05/13/22 1627 05/13/22 2127 05/14/22 0746 05/14/22 1133 05/14/22 1652  GLUCAP 130* 152* 128* 129* 156*   HbA1C: No results for input(s): "HGBA1C" in the last 72 hours. Urine analysis:    Component Value Date/Time   COLORURINE YELLOW 05/10/2022 1550   APPEARANCEUR HAZY (A) 05/10/2022 1550   LABSPEC >1.046 (H) 05/10/2022 1550   PHURINE 5.0 05/10/2022 1550   GLUCOSEU NEGATIVE 05/10/2022 1550   HGBUR NEGATIVE 05/10/2022 1550   HGBUR negative 05/17/2006 0000   BILIRUBINUR NEGATIVE 05/10/2022 1550    BILIRUBINUR small 03/30/2015 1546   KETONESUR 5 (A) 05/10/2022 1550   PROTEINUR 30 (A) 05/10/2022 1550   UROBILINOGEN 0.2 03/30/2015 1546   UROBILINOGEN 0.2 11/08/2013 0237   NITRITE NEGATIVE 05/10/2022 1550   LEUKOCYTESUR NEGATIVE 05/10/2022 1550   Sepsis Labs: '@LABRCNTIP'$ (procalcitonin:4,lacticidven:4) ) Recent Results (from the past 240 hour(s))  Resp panel by RT-PCR (RSV, Flu A&B, Covid) Anterior Nasal Swab     Status: None   Collection Time: 05/09/22  9:55 PM   Specimen: Anterior Nasal Swab  Result Value Ref Range Status   SARS Coronavirus 2 by RT PCR NEGATIVE NEGATIVE Final    Comment: (NOTE) SARS-CoV-2 target nucleic acids are NOT DETECTED.  The SARS-CoV-2 RNA is generally detectable in upper respiratory specimens during the acute phase of infection. The lowest concentration of SARS-CoV-2 viral copies this assay can detect is 138 copies/mL. A negative result does not preclude SARS-Cov-2 infection and should not be used as  the sole basis for treatment or other patient management decisions. A negative result may occur with  improper specimen collection/handling, submission of specimen other than nasopharyngeal swab, presence of viral mutation(s) within the areas targeted by this assay, and inadequate number of viral copies(<138 copies/mL). A negative result must be combined with clinical observations, patient history, and epidemiological information. The expected result is Negative.  Fact Sheet for Patients:  EntrepreneurPulse.com.au  Fact Sheet for Healthcare Providers:  IncredibleEmployment.be  This test is no t yet approved or cleared by the [place] FDA and  has been authorized for detection and/or diagnosis of SARS-CoV-2 by FDA under an Emergency Use Authorization (EUA). This EUA will remain  in effect (meaning this test can be used) for the duration of the COVID-19 declaration under Section 564(b)(1) of the Act,  21 U.S.C.section 360bbb-3(b)(1), unless the authorization is terminated  or revoked sooner.       Influenza A by PCR NEGATIVE NEGATIVE Final   Influenza B by PCR NEGATIVE NEGATIVE Final    Comment: (NOTE) The Xpert Xpress SARS-CoV-2/FLU/RSV plus assay is intended as an aid in the diagnosis of influenza from Nasopharyngeal swab specimens and should not be used as a sole basis for treatment. Nasal washings and aspirates are unacceptable for Xpert Xpress SARS-CoV-2/FLU/RSV testing.  Fact Sheet for Patients: EntrepreneurPulse.com.au  Fact Sheet for Healthcare Providers: IncredibleEmployment.be  This test is not yet approved or cleared by the [place] FDA and has been authorized for detection and/or diagnosis of SARS-CoV-2 by FDA under an Emergency Use Authorization (EUA). This EUA will remain in effect (meaning this test can be used) for the duration of the COVID-19 declaration under Section 564(b)(1) of the Act, 21 U.S.C. section 360bbb-3(b)(1), unless the authorization is terminated or revoked.     Resp Syncytial Virus by PCR NEGATIVE NEGATIVE Final    Comment: (NOTE) Fact Sheet for Patients: EntrepreneurPulse.com.au  Fact Sheet for Healthcare Providers: IncredibleEmployment.be  This test is not yet approved or cleared by the [place] FDA and has been authorized for detection and/or diagnosis of SARS-CoV-2 by FDA under an Emergency Use Authorization (EUA). This EUA will remain in effect (meaning this test can be used) for the duration of the COVID-19 declaration under Section 564(b)(1) of the Act, 21 U.S.C. section 360bbb-3(b)(1), unless the authorization is terminated or revoked.  Performed at [facility], [address], [place], [place] [address]   MRSA Next Gen by PCR, Nasal     Status: None   Collection Time: 05/10/22  4:15 AM   Specimen: Nasal Mucosa; Nasal Swab  Result Value Ref  Range Status   MRSA by PCR Next Gen NOT DETECTED NOT DETECTED Final    Comment: (NOTE) The GeneXpert MRSA Assay (FDA approved for NASAL specimens only), is one component of a comprehensive MRSA colonization surveillance program. It is not intended to diagnose MRSA infection nor to guide or monitor treatment for MRSA infections. Test performance is not FDA approved in patients less than 37 years old. Performed at [facility], [address], [place], [place] [address]   Culture, blood (Routine X 2) w Reflex to ID Panel     Status: None (Preliminary result)   Collection Time: 05/10/22  8:39 AM   Specimen: Right Antecubital; Blood  Result Value Ref Range Status   Specimen Description RIGHT ANTECUBITAL  Final   Special Requests   Final    BOTTLES DRAWN AEROBIC AND ANAEROBIC Blood Culture adequate volume   Culture   Final  NO GROWTH 4 DAYS Performed at [facility], [address] [place], [place] [address]    Report Status PENDING  Incomplete  Culture, blood (Routine X 2) w Reflex to ID Panel     Status: None (Preliminary result)   Collection Time: 05/10/22  8:39 AM   Specimen: BLOOD RIGHT ARM  Result Value Ref Range Status   Specimen Description BLOOD RIGHT ARM  Final   Special Requests   Final    BOTTLES DRAWN AEROBIC AND ANAEROBIC Blood Culture results may not be optimal due to an excessive volume of blood received in culture bottles   Culture   Final    NO GROWTH 4 DAYS Performed at [facility], [address] [place], [place] [address]    Report Status PENDING  Incomplete  Respiratory (~20 pathogens) panel by PCR     Status: None   Collection Time: 05/10/22  9:30 AM   Specimen: Nasopharyngeal Swab; Respiratory  Result Value Ref Range Status   Adenovirus NOT DETECTED NOT DETECTED Final   Coronavirus 229E NOT DETECTED NOT DETECTED Final    Comment: (NOTE) The Coronavirus on the Respiratory Panel, DOES NOT test for the novel  Coronavirus (2019 nCoV)    Coronavirus HKU1  NOT DETECTED NOT DETECTED Final   Coronavirus NL63 NOT DETECTED NOT DETECTED Final   Coronavirus OC43 NOT DETECTED NOT DETECTED Final   Metapneumovirus NOT DETECTED NOT DETECTED Final   Rhinovirus / Enterovirus NOT DETECTED NOT DETECTED Final   Influenza A NOT DETECTED NOT DETECTED Final   Influenza B NOT DETECTED NOT DETECTED Final   Parainfluenza Virus 1 NOT DETECTED NOT DETECTED Final   Parainfluenza Virus 2 NOT DETECTED NOT DETECTED Final   Parainfluenza Virus 3 NOT DETECTED NOT DETECTED Final   Parainfluenza Virus 4 NOT DETECTED NOT DETECTED Final   Respiratory Syncytial Virus NOT DETECTED NOT DETECTED Final   Bordetella pertussis NOT DETECTED NOT DETECTED Final   Bordetella Parapertussis NOT DETECTED NOT DETECTED Final   Chlamydophila pneumoniae NOT DETECTED NOT DETECTED Final   Mycoplasma pneumoniae NOT DETECTED NOT DETECTED Final    Comment: Performed at [facility], [place] [address], [place], [place] [address]     Scheduled Meds:  ALPRAZolam  0.5 mg Oral TID   arformoterol  15 mcg Nebulization BID   aspirin EC  81 mg Oral Daily   budesonide (PULMICORT) nebulizer solution  0.5 mg Nebulization BID   Chlorhexidine Gluconate Cloth  6 each Topical Q0600   clotrimazole  10 mg Oral 5 X Daily   fluticasone  2 spray Each Nare Daily   guaiFENesin  1,200 mg Oral BID   insulin aspart  0-6 Units Subcutaneous TID WC   levothyroxine  175 mcg Oral Q0600   methylPREDNISolone (SOLU-MEDROL) injection  80 mg Intravenous Q12H   morphine  15 mg Oral Q12H   mouth rinse  15 mL Mouth Rinse 4 times per day   pantoprazole  40 mg Oral BID   revefenacin  175 mcg Nebulization Daily   rosuvastatin  20 mg Oral Daily   senna-docusate  1 tablet Oral QHS   sertraline  100 mg Oral QHS   sodium chloride  2 spray Each Nare BID   Continuous Infusions:  Procedures/Studies: ECHOCARDIOGRAM COMPLETE  Result Date: 05/13/2022    ECHOCARDIOGRAM REPORT   Patient Name:   ALANA BOZZELLI Date of Exam:  05/13/2022 Medical Rec #:  [id number]           Height:  62.0 in Accession #:    [id number]          Weight:       135.1 lb Date of Birth:  [birth year]           BSA:          1.618 m Patient Age:    73 years            BP:           116/52 mmHg Patient Gender: F                   HR:           60 bpm. Exam Location:  [facility] Procedure: 2D Echo, Cardiac Doppler and Color Doppler Indications:    R07.9* Chest pain, unspecified; R06.02 SOB  History:        Patient has no prior history of Echocardiogram examinations.                 Abnormal ECG, COPD, Arrythmias:Tachycardia;                 Signs/Symptoms:Shortness of Breath, Dyspnea and Chest Pain.                 Pulmonary fibrosis.  Sonographer:    Roseanna Rainbow RDCS Referring Phys: [phone] Ernesha Ramone  Sonographer Comments: Technically difficult study due to poor echo windows. Chest wall deformity. IMPRESSIONS  1. Left ventricular ejection fraction, by estimation, is 60 to 65%. The left ventricle has normal function. The left ventricle has no regional wall motion abnormalities. There is mild asymmetric left ventricular hypertrophy of the basal-septal segment. Left ventricular diastolic parameters are indeterminate. Elevated left atrial pressure.  2. Right ventricular systolic function is normal. The right ventricular size is normal. Tricuspid regurgitation signal is inadequate for assessing PA pressure.  3. Left atrial size was severely dilated.  4. The mitral valve is normal in structure. Trivial mitral valve regurgitation. No evidence of mitral stenosis.  5. The aortic valve is tricuspid. Aortic valve regurgitation is not visualized.  6. The inferior vena cava is dilated in size with >50% respiratory variability, suggesting right atrial pressure of 8 mmHg. Comparison(s): No prior Echocardiogram. FINDINGS  Left Ventricle: Left ventricular ejection fraction, by estimation, is 60 to 65%. The left ventricle has normal function. The left ventricle has no regional wall  motion abnormalities. The left ventricular internal cavity size was small. There is mild asymmetric left ventricular hypertrophy of the basal-septal segment. Left ventricular diastolic parameters are indeterminate. Elevated left atrial pressure. Right Ventricle: The right ventricular size is normal. No increase in right ventricular wall thickness. Right ventricular systolic function is normal. Tricuspid regurgitation signal is inadequate for assessing PA pressure. Left Atrium: Left atrial size was severely dilated. Right Atrium: Right atrial size was normal in size. Pericardium: There is no evidence of pericardial effusion. Mitral Valve: The mitral valve is normal in structure. Trivial mitral valve regurgitation. No evidence of mitral valve stenosis. Tricuspid Valve: The tricuspid valve is normal in structure. Tricuspid valve regurgitation is not demonstrated. No evidence of tricuspid stenosis. Aortic Valve: The aortic valve is tricuspid. Aortic valve regurgitation is not visualized. Pulmonic Valve: The pulmonic valve was not well visualized. Pulmonic valve regurgitation is not visualized. No evidence of pulmonic stenosis. Aorta: The aortic root and ascending aorta are structurally normal, with no evidence of dilitation. Venous: The inferior vena cava is dilated in size with greater than 50% respiratory variability, suggesting right  atrial pressure of 8 mmHg. IAS/Shunts: The atrial septum is grossly normal.  LEFT VENTRICLE PLAX 2D LVIDd:         3.10 cm     Diastology LVIDs:         2.10 cm     LV e' medial:    6.53 cm/s LV PW:         1.00 cm     LV E/e' medial:  12.1 LV IVS:        1.30 cm     LV e' lateral:   8.05 cm/s LVOT diam:     2.20 cm     LV E/e' lateral: 9.9 LV SV:         129 LV SV Index:   80 LVOT Area:     3.80 cm  LV Volumes (MOD) LV vol d, MOD A2C: 75.6 ml LV vol d, MOD A4C: 82.1 ml LV vol s, MOD A2C: 25.9 ml LV vol s, MOD A4C: 29.6 ml LV SV MOD A2C:     49.7 ml LV SV MOD A4C:     82.1 ml LV SV MOD  BP:      50.7 ml RIGHT VENTRICLE             IVC RV S prime:     15.80 cm/s  IVC diam: 2.20 cm TAPSE (M-mode): 1.4 cm LEFT ATRIUM             Index        RIGHT ATRIUM           Index LA diam:        3.90 cm 2.41 cm/m   RA Area:     13.00 cm LA Vol (A2C):   97.3 ml 60.12 ml/m  RA Volume:   25.30 ml  15.63 ml/m LA Vol (A4C):   80.3 ml 49.62 ml/m LA Biplane Vol: 93.7 ml 57.90 ml/m  AORTIC VALVE LVOT Vmax:   147.00 cm/s LVOT Vmean:  98.800 cm/s LVOT VTI:    0.340 m  AORTA Ao Root diam: 3.50 cm Ao Asc diam:  3.20 cm MITRAL VALVE MV Area (PHT): 5.38 cm    SHUNTS MV Decel Time: 141 msec    Systemic VTI:  0.34 m MV E velocity: 79.30 cm/s  Systemic Diam: 2.20 cm MV A velocity: 56.90 cm/s MV E/A ratio:  1.39 Rudean Haskell MD Electronically signed by Rudean Haskell MD Signature Date/Time: 05/13/2022/2:18:19 PM    Final    DG CHEST PORT 1 VIEW  Result Date: 05/13/2022 CLINICAL DATA:  67 year old female presents with respiratory distress. EXAM: PORTABLE CHEST 1 VIEW COMPARISON:  May 09, 2022 FINDINGS: EKG leads project over the chest. Cardiomediastinal contours and hilar structures are normal. Diffuse interstitial and airspace opacities are similar with slightly diminished lung volumes. No pneumothorax. On limited assessment no acute skeletal findings. IMPRESSION: Similar appearance of the chest accounting for slightly diminished lung volumes with findings of chronic interstitial lung disease. With this pattern it would be difficult to exclude superimposed infection or edema. Electronically Signed   By: Zetta Bills M.D.   On: 05/13/2022 12:32   CT Angio Chest/Abd/Pel for Dissection W and/or Wo Contrast  Result Date: 05/09/2022 CLINICAL DATA:  Chest pain radiating into the neck, initial encounter EXAM: CT ANGIOGRAPHY CHEST, ABDOMEN AND PELVIS TECHNIQUE: Non-contrast CT of the chest was initially obtained. Multidetector CT imaging through the chest, abdomen and pelvis was performed using the  standard protocol during bolus administration of intravenous  contrast. Multiplanar reconstructed images and MIPs were obtained and reviewed to evaluate the vascular anatomy. RADIATION DOSE REDUCTION: This exam was performed according to the departmental dose-optimization program which includes automated exposure control, adjustment of the mA and/or kV according to patient size and/or use of iterative reconstruction technique. CONTRAST:  121m OMNIPAQUE IOHEXOL 350 MG/ML SOLN COMPARISON:  07/15/2020 FINDINGS: CTA CHEST FINDINGS Cardiovascular: Initial precontrast images demonstrate atherosclerotic calcification of the thoracic aorta. No aneurysmal dilatation is seen. No hyperdense crescent to suggest acute aortic injury is noted. Postcontrast images were then obtained and reveal no evidence of aortic dissection. Heart is not significantly enlarged. The pulmonary artery shows a normal branching pattern bilaterally. No filling defect to suggest pulmonary embolism is noted. Coronary calcifications are noted. Mediastinum/Nodes: Thoracic inlet is within normal limits. Prominent AP window lymph node has resolved in the interval from the prior exam. Persistent right hilar lymph nodes are seen likely reactive in nature. The esophagus as visualized is within normal limits. Lungs/Pleura: Lungs demonstrate diffuse fibrotic changes and honeycombing particularly in the bases. The previously seen diffuse superimposed infiltrates have resolved. There is a 4 mm nodule in the right upper lobe on image number 26 of series 9. No other sizable nodules are seen. No effusions are noted. Musculoskeletal: Degenerative changes of the thoracic spine are noted. No acute rib abnormality is seen. Chronic compression deformities of T5 and T7 are noted. Review of the MIP images confirms the above findings. CTA ABDOMEN AND PELVIS FINDINGS VASCULAR Aorta: Atherosclerotic calcifications are noted without aneurysmal dilatation or dissection. Celiac:  Patent without evidence of aneurysm, dissection, vasculitis or significant stenosis. SMA: Patent without evidence of aneurysm, dissection, vasculitis or significant stenosis. Renals: Both renal arteries are patent without evidence of aneurysm, dissection, vasculitis, fibromuscular dysplasia or significant stenosis. IMA: Patent without evidence of aneurysm, dissection, vasculitis or significant stenosis. Inflow: Iliacs demonstrate mild atherosclerotic calcification although no focal stenosis is seen. Veins: Visualized venous structures appear within normal limits. IVC filter is noted in satisfactory position. Review of the MIP images confirms the above findings. NON-VASCULAR Hepatobiliary: Gallbladder has been surgically removed. Liver shows no focal abnormality. Pancreas: Unremarkable. No pancreatic ductal dilatation or surrounding inflammatory changes. Spleen: Normal in size without focal abnormality. Adrenals/Urinary Tract: Adrenal glands are within normal limits. Kidneys demonstrate a normal enhancement pattern bilaterally. No renal calculi or obstructive changes are seen. The bladder is partially distended. Stomach/Bowel: Fecal material is noted scattered throughout the colon. No obstructive or inflammatory changes are seen. The appendix is not well visualized and may have been surgically. No inflammatory changes are seen. The small bowel and stomach are unremarkable. Lymphatic: No lymphadenopathy is noted. Reproductive: Status post hysterectomy. No adnexal masses. Other: No abdominal wall hernia or abnormality. No abdominopelvic ascites. Musculoskeletal: Postsurgical changes are noted in the lower lumbar spine. Prior vertebral augmentation at L1 is seen. Chronic L3 compression fracture is noted. Review of the MIP images confirms the above findings. IMPRESSION: CTA of the chest: No evidence of aortic injury. No pulmonary embolism is seen. Persistent right hilar lymph nodes likely reactive in nature. Chronic  fibrotic changes are noted in the lungs bilaterally similar to prior exam. 4 mm right solid pulmonary nodule within the upper lobe. Per Fleischner Society Guidelines, a non-contrast Chest CT at 12 months is optional. If performed and the nodule is stable at 12 months, no further follow-up is recommended. These guidelines do not apply to immunocompromised patients and patients with cancer. Follow up in patients with significant comorbidities as clinically  warranted. For lung cancer screening, adhere to Lung-RADS guidelines. Reference: Radiology. 2017; 284(1):228-43. CTA of the abdomen and pelvis: No aortic abnormality is noted. No acute abnormality is seen. Electronically Signed   By: Inez Catalina M.D.   On: 05/09/2022 23:40   CT Head Wo Contrast  Result Date: 05/09/2022 CLINICAL DATA:  New onset headache EXAM: CT HEAD WITHOUT CONTRAST TECHNIQUE: Contiguous axial images were obtained from the base of the skull through the vertex without intravenous contrast. RADIATION DOSE REDUCTION: This exam was performed according to the departmental dose-optimization program which includes automated exposure control, adjustment of the mA and/or kV according to patient size and/or use of iterative reconstruction technique. COMPARISON:  None Available. FINDINGS: Brain: There is no mass, hemorrhage or extra-axial collection. The size and configuration of the ventricles and extra-axial CSF spaces are normal. The brain parenchyma is normal, without acute or chronic infarction. Vascular: No abnormal hyperdensity of the major intracranial arteries or dural venous sinuses. No intracranial atherosclerosis. Skull: The visualized skull base, calvarium and extracranial soft tissues are normal. Sinuses/Orbits: No fluid levels or advanced mucosal thickening of the visualized paranasal sinuses. No mastoid or middle ear effusion. The orbits are normal. IMPRESSION: Normal head CT. Electronically Signed   By: Ulyses Jarred M.D.   On:  05/09/2022 23:22   DG Chest Portable 1 View  Result Date: 05/09/2022 CLINICAL DATA:  Chest pain and shortness of breath EXAM: PORTABLE CHEST 1 VIEW COMPARISON:  07/15/2020 FINDINGS: Cardiac shadow is stable. Diffuse fibrotic changes are identified throughout both lungs. No focal infiltrate or sizable effusion is seen. No bony abnormality is noted. IMPRESSION: Changes consistent with pulmonary fibrosis without acute abnormality. Electronically Signed   By: Inez Catalina M.D.   On: 05/09/2022 22:03    Orson Eva, DO  [facility]  If 7PM-7AM, please contact night-coverage [email or web address] Password [facility] 05/14/2022, 5:33 PM   LOS: 4 days

## 2022-05-14 NOTE — Consult Note (Signed)
Consultation Note Date: 05/14/2022   Patient Name: [name] Fitzpatrick  DOB: 1955-11-16  MRN: [id number]  Age / Sex: 67 y.o., female  PCP: Joya Gaskins, FNP Referring Physician: Orson Eva, MD  Reason for Consultation: Establishing goals of care  HPI/Patient Profile: 67 y.o. female  with past medical history of chronic hypoxic respiratory failure on 6 L continuous, idiopathic pulmonary fibrosis, DM 2, hypothyroid, anemia of chronic disease with baseline hemoglobin 9-11, anxiety/depression, fibromyalgia, history of acute DVT in left femoral vein, OA, migraine, admitted on 05/09/2022 with acute on chronic respiratory failure with hypoxia secondary to pulmonary fibrosis exacerbation, suspecting underlying viral infection.   Clinical Assessment and Goals of Care: I have reviewed medical records including EPIC notes, labs and imaging, received report from RN, assessed the patient.  [name] Fitzpatrick is lying in bed.  She appears acutely/chronically ill and somewhat frail.  She is alert and oriented x 3, able to make her needs known.  There is no family at bedside at this time.  Bedside nursing staff is present attending to personal needs.  Although clearly short of breath, [name] Fitzpatrick is able to bathe her own room lower extremities, readily and easily raising her legs off the bed.  We meet at the bedside to discuss diagnosis prognosis, GOC, EOL wishes, disposition and options.  I introduced Palliative Medicine as specialized medical care for people living with serious illness. It focuses on providing relief from the symptoms and stress of a serious illness. The goal is to improve quality of life for both the patient and the family.  We discussed a brief life review of the patient.  [name] Fitzpatrick and her husband have been married for 43+ years.  She had 2 sons, but 1 died.  She has been living with her husband and  overall is independent with IADLs.  She sees pulmonologist in [place].  Overall, she seems knowledgeable about her idiopathic pulmonary fibrosis.   We then focused on their current illness.  We talk about the treatment plan.  Continue Solu-Medrol, breathing treatments as directed by pulmonology, respiratory panel negative.  [name] Fitzpatrick tells me that she sees pulmonology outpatient in [place].  The natural disease trajectory and expectations at EOL were discussed.  Advanced directives, concepts specific to code status, artifical feeding and hydration, and rehospitalization were considered and discussed.  We talk about CODE STATUS.  At this point [name] Fitzpatrick states, "I do not want to talk about it".  She does tell me that she was on life support for about 5 months with tracheotomy.  I encouraged her to think about what she does and does not want.  Discussed the importance of continued conversation with family and the medical providers regarding overall plan of care and treatment options, ensuring decisions are within the context of the patient's values and GOCs.  Questions and concerns were addressed.  The patient was encouraged to call with questions or concerns.  PMT will continue to support holistically.  Conference with attending, bedside nursing staff, transition of care  team related to patient condition, needs, goals of care, disposition.    HCPOA -spouse, Aiden Johnsie Fitzpatrick    SUMMARY OF RECOMMENDATIONS   At this point continue full scope/full code Time for outcomes Considering short-term rehab   Code Status/Advance Care Planning: Full code -states she was on life support for 5 months, prior tracheotomy  Symptom Management:  Per hospitalist, no additional needs at this time.  Palliative Prophylaxis:  Frequent Pain Assessment and Oral Care  Additional Recommendations (Limitations, Scope, Preferences): Full Scope Treatment  Psycho-social/Spiritual:  Desire for further  Chaplaincy support:no Additional Recommendations: Caregiving  Support/Resources  Prognosis:  Unable to determine, prognostication for pulmonary issues can be difficult.  Overall functional status is high.  Discharge Planning: To be determined, considering short-term rehab      Primary Diagnoses: Present on Admission:  Acute on chronic hypoxic respiratory failure (HCC)  Acute exacerbation of idiopathic pulmonary fibrosis (HCC)  Leukocytosis  GAD (generalized anxiety disorder)  Acquired hypothyroidism  Allergic rhinitis  Anemia of chronic disease   I have reviewed the medical record, interviewed the patient and family, and examined the patient. The following aspects are pertinent.  Past Medical History:  Diagnosis Date   Acute deep vein thrombosis (DVT) of left femoral vein (HCC)    Acute on chronic respiratory failure with hypoxia (HCC)    Anxiety    ARDS (adult respiratory distress syndrome) (HCC)    Chronic LBP    Chronic neck pain    COPD (chronic obstructive pulmonary disease) (HCC)    Depression    DM type 2 (diabetes mellitus, type 2) (HCC)    Dyspnea 05/25/2020   Fibromyalgia    GERD (gastroesophageal reflux disease)    H/O pulmonary fibrosis    H/O suicide attempt    Hashimoto's thyroiditis    Hypertension    Hypothyroidism    IBS (irritable bowel syndrome)    with constipation   Interstitial pneumonia (HCC)    Migraine headache    Osteoarthritis    Seizure disorder ([place])    Thyroiditis, 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   had total thyroidectomy 11/30/2013 at [place] History   Marital status: Married    Spouse name: Not on file   Number of children: 2   Years of education: Not on file   Highest education level: Not on file  Occupational History   Occupation: disable - back  Tobacco Use   Smoking status: Former    Packs/day: 0.30    Years: 40.00    Total pack years: 12.00    Types: Cigarettes    Quit date: 07/24/2012    Years  since quitting: 9.8   Smokeless tobacco: Never  Vaping Use   Vaping Use: Never used  Substance and Sexual Activity   Alcohol use: No   Drug use: Yes    Comment: 20 yrs ago, OD on prescription drugs   Sexual activity: Not Currently    Birth control/protection: Surgical  Other Topics Concern   Not on file  Social History Narrative   Not on file   Social Determinants of Health   Financial Resource Strain: Not on file  Food Insecurity: No Food Insecurity (05/11/2022)   Hunger Vital Sign    Worried About Running Out of Food in the Last Year: Never true    Ran Out of Food in the Last Year: Never true  Transportation Needs: No Transportation Needs (05/11/2022)   PRAPARE - Hydrologist (Medical): No  Lack of Transportation (Non-Medical): No  Physical Activity: Not on file  Stress: Not on file  Social Connections: Not on file   Family History  Problem Relation Age of Onset   Cancer Father        pancreatic    Pancreatic cancer Father    Heart failure Mother    GI problems Mother    Heart disease Mother    Diabetes Mother    Thyroid disease Sister    Cancer Maternal Grandfather    Cancer Other        family history    Scheduled Meds:  ALPRAZolam  0.5 mg Oral TID   arformoterol  15 mcg Nebulization BID   aspirin EC  81 mg Oral Daily   budesonide (PULMICORT) nebulizer solution  0.5 mg Nebulization BID   Chlorhexidine Gluconate Cloth  6 each Topical Q0600   clotrimazole  10 mg Oral 5 X Daily   fluticasone  2 spray Each Nare Daily   guaiFENesin  1,200 mg Oral BID   insulin aspart  0-6 Units Subcutaneous TID WC   levothyroxine  175 mcg Oral Q0600   methylPREDNISolone (SOLU-MEDROL) injection  80 mg Intravenous Q12H   morphine  15 mg Oral Q12H   mouth rinse  15 mL Mouth Rinse 4 times per day   pantoprazole  40 mg Oral BID   revefenacin  175 mcg Nebulization Daily   rosuvastatin  20 mg Oral Daily   senna-docusate  1 tablet Oral QHS   sertraline   100 mg Oral QHS   sodium chloride  2 spray Each Nare BID   Continuous Infusions: PRN Meds:.acetaminophen **OR** acetaminophen, albuterol, HYDROcodone bit-homatropine, HYDROmorphone, LORazepam, melatonin, nitroGLYCERIN, ondansetron (ZOFRAN) IV, mouth rinse, phenol, polyethylene glycol, traZODone Medications Prior to Admission:  Prior to Admission medications   Medication Sig Start Date End Date Taking? Authorizing Provider  acetaminophen (TYLENOL) 500 MG tablet Take 1,000 mg by mouth every 6 (six) hours as needed for mild pain.   Yes [provider]  ALPRAZolam Duanne Moron) 0.5 MG tablet Take 0.5 mg by mouth 3 (three) times daily as needed for anxiety. 05/09/22  Yes [provider]  aspirin EC 81 MG tablet Take 81 mg by mouth daily.   Yes [provider]  HYDROmorphone (DILAUDID) 4 MG tablet Take 4 mg by mouth every 6 (six) hours as needed for moderate pain.   Yes [provider]  levothyroxine (SYNTHROID) 175 MCG tablet Take 175 mcg by mouth daily. 03/31/20  Yes [provider]  losartan (COZAAR) 25 MG tablet Take 25 mg by mouth daily. 05/16/21  Yes [provider]  montelukast (SINGULAIR) 10 MG tablet Take 10 mg by mouth at bedtime.   Yes [provider]  morphine (MS CONTIN) 15 MG 12 hr tablet Take 15 mg by mouth every 8 (eight) hours.   Yes [provider]  rosuvastatin (CRESTOR) 20 MG tablet Take 20 mg by mouth daily. 07/12/21  Yes [provider]  sertraline (ZOLOFT) 100 MG tablet Take 100 mg by mouth in the morning and at bedtime.   Yes [provider]  traZODone (DESYREL) 100 MG tablet Take 100-200 mg by mouth at bedtime as needed for sleep.   Yes [provider]  budesonide (PULMICORT) 0.25 MG/2ML nebulizer solution Take 2 mLs (0.25 mg total) by nebulization 2 (two) times daily. Patient not taking: Reported on 05/10/2022 07/19/20   Deatra James, MD  dicyclomine (BENTYL) 10 MG capsule TAKE 1  CAPSULE  BY MOUTH TWICE DAILY. Patient not taking: Reported on 05/10/2022 09/03/14   Fayrene Helper, MD  fluticasone [facility]) 50 MCG/ACT nasal spray PLACE 2 SPRAYS INTO BOTH NOSTRILS DAILY Patient not taking: Reported on 05/10/2022 04/11/16   Fayrene Helper, MD  gabapentin (NEURONTIN) 300 MG capsule Take 1 capsule (300 mg total) by mouth 3 (three) times daily. 07/19/20 08/18/20  Shahmehdi, Valeria Batman, MD  ipratropium-albuterol (DUONEB) 0.5-2.5 (3) MG/3ML SOLN Take 3 mLs by nebulization every 6 (six) hours as needed. Patient not taking: Reported on 05/10/2022 05/31/20   Elgergawy, Silver Huguenin, MD  naloxone [facility]) nasal spray 4 mg/0.1 mL  06/01/20   [provider]  pantoprazole (PROTONIX) 40 MG tablet Take 40 mg by mouth 2 (two) times daily. Patient not taking: Reported on 05/10/2022 05/20/20   [provider]  promethazine (PHENERGAN) 25 MG tablet Take 1 tablet (25 mg total) by mouth daily as needed for nausea or vomiting. Patient not taking: Reported on 05/10/2022 07/07/15   Fayrene Helper, MD   Allergies  Allergen Reactions   Levofloxacin Shortness Of Breath and Itching    WHELPS WHELPS WHELPS WHELPS    Penicillins Other (See Comments)    Stroke-like symptoms Has patient had a PCN reaction causing immediate rash, facial/tongue/throat swelling, SOB or lightheadedness with hypotension: YES Has patient had a PCN reaction causing severe rash involving mucus membranes or skin necrosis: No Has patient had a PCN reaction that required hospitalization: No Has patient had a PCN reaction occurring within the last 10 years: Yes If all of the above answers are "NO", then may proceed with Cephalosporin use.    Prednisone Shortness Of Breath and Nausea And Vomiting   Cephalexin Hives, Swelling and Rash   Latex Rash and Other (See Comments)    Causes skin to tear easily   Metoclopramide Hcl Hives and Rash   Review of Systems  Unable to perform ROS: Acuity of condition     Physical Exam Vitals and nursing note reviewed.  Constitutional:      General: She is not in acute distress.    Appearance: She is ill-appearing.  Pulmonary:     Effort: Tachypnea present.  Skin:    General: Skin is warm and dry.  Neurological:     Mental Status: She is alert and oriented to person, place, and time.  Psychiatric:        Mood and Affect: Mood normal.        Behavior: Behavior normal.     Vital Signs: BP 122/63   Pulse 61   Temp 97.6 F (36.4 C) (Oral)   Resp 14   Ht 5' 2"$  (1.575 m)   Wt 62.3 kg   SpO2 100%   BMI 25.12 kg/m  Pain Scale: 0-10 POSS *See Group Information*: 1-Acceptable,Awake and alert Pain Score: 8    SpO2: SpO2: 100 % O2 Device:SpO2: 100 % O2 Flow Rate: .O2 Flow Rate (L/min): 13 L/min  IO: Intake/output summary:  Intake/Output Summary (Last 24 hours) at 05/14/2022 1314 Last data filed at 05/14/2022 0933 Gross per 24 hour  Intake 720 ml  Output 1200 ml  Net -480 ml    LBM: Last BM Date : 05/09/22 Baseline Weight: Weight: 58.1 kg Most recent weight: Weight: 62.3 kg     Palliative Assessment/Data:     Time In: 0850 Time Out: 0945 Time Total: 55 minutes  Greater than 50%  of this time was spent counseling and coordinating care related to the above assessment  and plan.  Signed by: Drue Novel, NP   Please contact Palliative Medicine Team phone at [phone] for questions and concerns.  For individual provider: See Shea Evans

## 2022-05-15 ENCOUNTER — Inpatient Hospital Stay (HOSPITAL_COMMUNITY): Payer: Medicare HMO

## 2022-05-15 DIAGNOSIS — J84112 Idiopathic pulmonary fibrosis: Secondary | ICD-10-CM | POA: Diagnosis not present

## 2022-05-15 DIAGNOSIS — D638 Anemia in other chronic diseases classified elsewhere: Secondary | ICD-10-CM | POA: Diagnosis not present

## 2022-05-15 DIAGNOSIS — J9621 Acute and chronic respiratory failure with hypoxia: Secondary | ICD-10-CM | POA: Diagnosis not present

## 2022-05-15 DIAGNOSIS — Z7189 Other specified counseling: Secondary | ICD-10-CM | POA: Diagnosis not present

## 2022-05-15 DIAGNOSIS — Z515 Encounter for palliative care: Secondary | ICD-10-CM | POA: Diagnosis not present

## 2022-05-15 LAB — CULTURE, BLOOD (ROUTINE X 2)
Culture: NO GROWTH
Culture: NO GROWTH
Special Requests: ADEQUATE

## 2022-05-15 LAB — GLUCOSE, CAPILLARY
Glucose-Capillary: 106 mg/dL — ABNORMAL HIGH (ref 70–99)
Glucose-Capillary: 174 mg/dL — ABNORMAL HIGH (ref 70–99)
Glucose-Capillary: 138 mg/dL — ABNORMAL HIGH (ref 70–99)
Glucose-Capillary: 159 mg/dL — ABNORMAL HIGH (ref 70–99)

## 2022-05-15 LAB — CBC
HCT: 33.9 % — ABNORMAL LOW (ref 36.0–46.0)
Hemoglobin: 10.6 g/dL — ABNORMAL LOW (ref 12.0–15.0)
MCH: 28.2 pg (ref 26.0–34.0)
MCHC: 31.3 g/dL (ref 30.0–36.0)
MCV: 90.2 fL (ref 80.0–100.0)
Platelets: 227 10*3/uL (ref 150–400)
RBC: 3.76 MIL/uL — ABNORMAL LOW (ref 3.87–5.11)
RDW: 14 % (ref 11.5–15.5)
WBC: 10.4 10*3/uL (ref 4.0–10.5)
nRBC: 0 % (ref 0.0–0.2)

## 2022-05-15 LAB — TROPONIN I (HIGH SENSITIVITY)
Troponin I (High Sensitivity): 3 ng/L (ref <18)
Troponin I (High Sensitivity): 3 ng/L (ref <18)

## 2022-05-15 LAB — BASIC METABOLIC PANEL
Anion gap: 6 (ref 5–15)
BUN: 14 mg/dL (ref 8–23)
CO2: 34 mmol/L — ABNORMAL HIGH (ref 22–32)
Calcium: 8.6 mg/dL — ABNORMAL LOW (ref 8.9–10.3)
Chloride: 93 mmol/L — ABNORMAL LOW (ref 98–111)
Creatinine, Ser: 0.5 mg/dL (ref 0.44–1.00)
GFR, Estimated: >60 mL/min (ref >60)
Glucose, Bld: 108 mg/dL — ABNORMAL HIGH (ref 70–99)
Potassium: 4.3 mmol/L (ref 3.5–5.1)
Sodium: 133 mmol/L — ABNORMAL LOW (ref 135–145)

## 2022-05-15 LAB — BRAIN NATRIURETIC PEPTIDE: B Natriuretic Peptide: 231 pg/mL — ABNORMAL HIGH (ref 0.0–100.0)

## 2022-05-15 LAB — PROCALCITONIN: Procalcitonin: <0.1 ng/mL

## 2022-05-15 LAB — MAGNESIUM: Magnesium: 1.8 mg/dL (ref 1.7–2.4)

## 2022-05-15 LAB — PHOSPHORUS: Phosphorus: 3.8 mg/dL (ref 2.5–4.6)

## 2022-05-15 MED ORDER — MORPHINE SULFATE (PF) 2 MG/ML IV SOLN
1.0000 mg | Freq: Once | INTRAVENOUS | Status: AC
  Administered 2022-05-15: 1 mg via INTRAVENOUS
  Filled 2022-05-15: qty 1

## 2022-05-15 MED ORDER — DOCUSATE SODIUM 100 MG PO CAPS
100.0000 mg | ORAL_CAPSULE | Freq: Two times a day (BID) | ORAL | Status: DC
  Administered 2022-05-15 – 2022-05-16 (×3): 100 mg via ORAL
  Filled 2022-05-15 (×3): qty 1

## 2022-05-15 MED ORDER — MINERAL OIL RE ENEM: 1.0000 | ENEMA | Freq: Every day | RECTAL | Status: DC | PRN

## 2022-05-15 NOTE — Progress Notes (Addendum)
D[facility]station is at bedside; Bipap order is PRN.  Patient is currently on 13L [place] with sat of 98%.

## 2022-05-15 NOTE — Progress Notes (Signed)
PROGRESS NOTE  [name] Fitzpatrick [id number] DOB: 10/20/1955 DOA: 05/09/2022 PCP: Joya Gaskins, FNP  Brief History:  67 year old female with a history of chronic respiratory failure on 6 L, pulmonary fibrosis, diabetes mellitus type 2, anemia of chronic disease, anxiety, hypertension presenting with worsening cough and shortness of breath over the last 2 to 3 days.  The patient states that she has been having some headaches which began on 05/04/2022 followed by some emesis.  She states that her emesis has improved, unfortunately, her shortness of breath and coughing have worsened.  She has had some fevers at home up to 101.2 F.  She denies any hemoptysis, abdominal pain, diarrhea, hematochezia, melena.  She denies any focal extremity weakness or visual loss. She denies any worsening lower extremity edema. In the ED, the patient was afebrile with soft blood pressures in the 90s.  WBC 11.8, hemoglobin 10.2, platelets 222,000. BMP showed sodium 134, potassium 4.1, bicarbonate 26, BUN 20, creatinine 0.74.  LFTs unremarkable.  BNP 274.  CTA chest was negative for PE, negative dissection.  There is diffuse fibrotic changes with honeycombing.  There is a right upper lobe 4 mm nodule.  CTA abdomen pelvis was negative for any acute findings.  EKG shows sinus rhythm with incomplete right bundle branch block.  Troponin 11>>   Assessment/Plan: Acute on chronic respiratory failure with hypoxia -Secondary to exacerbation of pulmonary fibrosis -Suspect underlying viral infection -Chronically on 6 L nasal cannula at home -On 15 L high flow>>wean for saturation 88-95%>>13L -05/09/2022 CTA chest negative for PE, consolidation -Pulmonary consult appreciated -Continue IV Solu-Medrol -added Yupelri -continue Pulmicort and Brovana -COVID-19 PCR negative, RSV negative, influenza negative -check PCT <0.10 -viral resp panel--neg 2/27 AM--had episode respiratory distress with CP --repeat  CXR --morphine IV 1 mg x 1 for episodes of distress --added guaifensin, hycodan --anxiety/panic attacks driving resp distress episodes --2/25 Echo--EF 60-65%, no WMA, normal RVF, trivial MR --check troponins, bmp, cbc   COPD exacerbation -continue IV solumedrol -continue Yupelri, brovana, pulmicort   Anxiety disorder -Continue home dose alprazolam, Zoloft -continue ativan prn   Opioid dependence was -PDMP reviewed--patient receives morphine ER 15 mg, 90 monthly -Patient receives Dilaudid 4 mg, #120 monthly -Patient received Xanax #90, 0.5 mg   Hypothyroidism -Continue Synthroid   Diabetes mellitus type 2 -Appears to be managed by lifestyle modification -NovoLog sliding scale -Check A1c--5.3 -CBGs controlled   Mixed hyperlipidemia -continue statin   GOC -initially DNR -verbalized to me in am 05/10/22 she wanted full scope of care -consult palliative to continue discussion        Subjective: Pt had nausea overnight.  Denies f/c, headache, emesis, diarrhea.  Objective: Vitals:   05/15/22 0300 05/15/22 0400 05/15/22 0500 05/15/22 0600  BP: (!) 162/58 (!) 149/87 (!) 143/45 (!) 144/60  Pulse: (!) 57 78 (!) 59 (!) 58  Resp: 15 (!) '23 12 12  '$ Temp:  98.4 F (36.9 C)    TempSrc:  Oral    SpO2: 100% 99% 100% 100%  Weight:   64.1 kg   Height:        Intake/Output Summary (Last 24 hours) at 05/15/2022 0818 Last data filed at 05/15/2022 [id number] Gross per 24 hour  Intake 360 ml  Output 1100 ml  Net -740 ml   Weight change: 1.8 kg Exam:  General:  Pt is alert, follows commands appropriately, not in acute distress HEENT: No icterus, No thrush, No neck mass, [place]/AT Cardiovascular:  RRR, S1/S2, no rubs, no gallops Respiratory: bilateral crackles.  Upper airway wheeze Abdomen: Soft/+BS, non tender, non distended, no guarding Extremities: trace LE edema, No lymphangitis, No petechiae, No rashes, no synovitis   Data Reviewed: I have personally reviewed following labs  and imaging studies Basic Metabolic Panel: Recent Labs  Lab 05/09/22 2201 05/10/22 0550 05/11/22 0502 05/12/22 0219 05/13/22 1245  NA 134* 132* 136 137 134*  K 4.1 4.2 4.1 4.1 3.9  CL 98 99 103 101 97*  CO2 '26 24 28 28 29  '$ GLUCOSE 110* 157* 157* 124* 160*  BUN '20 21 14 15 16  '$ CREATININE 0.74 0.66 0.53 0.62 0.55  CALCIUM 9.1 8.9 8.9 8.8* 8.6*  MG 1.8 1.8 1.8 1.8 1.7  PHOS  --  3.9  --  3.3 3.0   Liver Function Tests: Recent Labs  Lab 05/09/22 2201 05/10/22 0550 05/13/22 1245  AST '18 18 16  '$ ALT '13 13 15  '$ ALKPHOS 57 58 46  BILITOT 0.4 0.5 0.4  PROT 6.7 6.8 5.7*  ALBUMIN 3.6 3.5 2.9*   Recent Labs  Lab 05/09/22 2201  LIPASE 25   No results for input(s): "AMMONIA" in the last 168 hours. Coagulation Profile: Recent Labs  Lab 05/09/22 2201  INR 1.0   CBC: Recent Labs  Lab 05/09/22 2201 05/10/22 0550 05/12/22 0422 05/13/22 1245  WBC 11.8* 6.7 10.0 10.5  NEUTROABS 9.5* 6.1  --   --   HGB 10.2* 10.1* 9.6* 9.9*  HCT 32.6* 31.6* 31.4* 31.6*  MCV 89.3 89.8 93.2 90.0  PLT 222 186 196 207   Cardiac Enzymes: No results for input(s): "CKTOTAL", "CKMB", "CKMBINDEX", "TROPONINI" in the last 168 hours. BNP: Invalid input(s): "POCBNP" CBG: Recent Labs  Lab 05/14/22 0746 05/14/22 1133 05/14/22 1652 05/14/22 2147 05/15/22 0814  GLUCAP 128* 129* 156* 134* 106*   HbA1C: No results for input(s): "HGBA1C" in the last 72 hours. Urine analysis:    Component Value Date/Time   COLORURINE YELLOW 05/10/2022 1550   APPEARANCEUR HAZY (A) 05/10/2022 1550   LABSPEC >1.046 (H) 05/10/2022 1550   PHURINE 5.0 05/10/2022 1550   GLUCOSEU NEGATIVE 05/10/2022 1550   HGBUR NEGATIVE 05/10/2022 1550   HGBUR negative 05/17/2006 0000   BILIRUBINUR NEGATIVE 05/10/2022 1550   BILIRUBINUR small 03/30/2015 1546   KETONESUR 5 (A) 05/10/2022 1550   PROTEINUR 30 (A) 05/10/2022 1550   UROBILINOGEN 0.2 03/30/2015 1546   UROBILINOGEN 0.2 11/08/2013 0237   NITRITE NEGATIVE 05/10/2022  1550   LEUKOCYTESUR NEGATIVE 05/10/2022 1550   Sepsis Labs: '@LABRCNTIP'$ (procalcitonin:4,lacticidven:4) ) Recent Results (from the past 240 hour(s))  Resp panel by RT-PCR (RSV, Flu A&B, Covid) Anterior Nasal Swab     Status: None   Collection Time: 05/09/22  9:55 PM   Specimen: Anterior Nasal Swab  Result Value Ref Range Status   SARS Coronavirus 2 by RT PCR NEGATIVE NEGATIVE Final    Comment: (NOTE) SARS-CoV-2 target nucleic acids are NOT DETECTED.  The SARS-CoV-2 RNA is generally detectable in upper respiratory specimens during the acute phase of infection. The lowest concentration of SARS-CoV-2 viral copies this assay can detect is 138 copies/mL. A negative result does not preclude SARS-Cov-2 infection and should not be used as the sole basis for treatment or other patient management decisions. A negative result may occur with  improper specimen collection/handling, submission of specimen other than nasopharyngeal swab, presence of viral mutation(s) within the areas targeted by this assay, and inadequate number of viral copies(<138 copies/mL). A negative result must be  combined with clinical observations, patient history, and epidemiological information. The expected result is Negative.  Fact Sheet for Patients:  EntrepreneurPulse.com.au  Fact Sheet for Healthcare Providers:  IncredibleEmployment.be  This test is no t yet approved or cleared by the [place] FDA and  has been authorized for detection and/or diagnosis of SARS-CoV-2 by FDA under an Emergency Use Authorization (EUA). This EUA will remain  in effect (meaning this test can be used) for the duration of the COVID-19 declaration under Section 564(b)(1) of the Act, 21 U.S.C.section 360bbb-3(b)(1), unless the authorization is terminated  or revoked sooner.       Influenza A by PCR NEGATIVE NEGATIVE Final   Influenza B by PCR NEGATIVE NEGATIVE Final    Comment: (NOTE) The  Xpert Xpress SARS-CoV-2/FLU/RSV plus assay is intended as an aid in the diagnosis of influenza from Nasopharyngeal swab specimens and should not be used as a sole basis for treatment. Nasal washings and aspirates are unacceptable for Xpert Xpress SARS-CoV-2/FLU/RSV testing.  Fact Sheet for Patients: EntrepreneurPulse.com.au  Fact Sheet for Healthcare Providers: IncredibleEmployment.be  This test is not yet approved or cleared by the [place] FDA and has been authorized for detection and/or diagnosis of SARS-CoV-2 by FDA under an Emergency Use Authorization (EUA). This EUA will remain in effect (meaning this test can be used) for the duration of the COVID-19 declaration under Section 564(b)(1) of the Act, 21 U.S.C. section 360bbb-3(b)(1), unless the authorization is terminated or revoked.     Resp Syncytial Virus by PCR NEGATIVE NEGATIVE Final    Comment: (NOTE) Fact Sheet for Patients: EntrepreneurPulse.com.au  Fact Sheet for Healthcare Providers: IncredibleEmployment.be  This test is not yet approved or cleared by the [place] FDA and has been authorized for detection and/or diagnosis of SARS-CoV-2 by FDA under an Emergency Use Authorization (EUA). This EUA will remain in effect (meaning this test can be used) for the duration of the COVID-19 declaration under Section 564(b)(1) of the Act, 21 U.S.C. section 360bbb-3(b)(1), unless the authorization is terminated or revoked.  Performed at [facility], [address], [place], [place] [address]   MRSA Next Gen by PCR, Nasal     Status: None   Collection Time: 05/10/22  4:15 AM   Specimen: Nasal Mucosa; Nasal Swab  Result Value Ref Range Status   MRSA by PCR Next Gen NOT DETECTED NOT DETECTED Final    Comment: (NOTE) The GeneXpert MRSA Assay (FDA approved for NASAL specimens only), is one component of a comprehensive MRSA colonization  surveillance program. It is not intended to diagnose MRSA infection nor to guide or monitor treatment for MRSA infections. Test performance is not FDA approved in patients less than 96 years old. Performed at [facility], [address] [place], [place] [address]   Culture, blood (Routine X 2) w Reflex to ID Panel     Status: None (Preliminary result)   Collection Time: 05/10/22  8:39 AM   Specimen: Right Antecubital; Blood  Result Value Ref Range Status   Specimen Description RIGHT ANTECUBITAL  Final   Special Requests   Final    BOTTLES DRAWN AEROBIC AND ANAEROBIC Blood Culture adequate volume   Culture   Final    NO GROWTH 4 DAYS Performed at [facility], [address] [place], [place] [address]    Report Status PENDING  Incomplete  Culture, blood (Routine X 2) w Reflex to ID Panel     Status: None (Preliminary result)   Collection Time: 05/10/22  8:39  AM   Specimen: BLOOD RIGHT ARM  Result Value Ref Range Status   Specimen Description BLOOD RIGHT ARM  Final   Special Requests   Final    BOTTLES DRAWN AEROBIC AND ANAEROBIC Blood Culture results may not be optimal due to an excessive volume of blood received in culture bottles   Culture   Final    NO GROWTH 4 DAYS Performed at [facility], [address] [place], [place] [address]    Report Status PENDING  Incomplete  Respiratory (~20 pathogens) panel by PCR     Status: None   Collection Time: 05/10/22  9:30 AM   Specimen: Nasopharyngeal Swab; Respiratory  Result Value Ref Range Status   Adenovirus NOT DETECTED NOT DETECTED Final   Coronavirus 229E NOT DETECTED NOT DETECTED Final    Comment: (NOTE) The Coronavirus on the Respiratory Panel, DOES NOT test for the novel  Coronavirus (2019 nCoV)    Coronavirus HKU1 NOT DETECTED NOT DETECTED Final   Coronavirus NL63 NOT DETECTED NOT DETECTED Final   Coronavirus OC43 NOT DETECTED NOT DETECTED Final   Metapneumovirus NOT DETECTED NOT DETECTED Final   Rhinovirus /  Enterovirus NOT DETECTED NOT DETECTED Final   Influenza A NOT DETECTED NOT DETECTED Final   Influenza B NOT DETECTED NOT DETECTED Final   Parainfluenza Virus 1 NOT DETECTED NOT DETECTED Final   Parainfluenza Virus 2 NOT DETECTED NOT DETECTED Final   Parainfluenza Virus 3 NOT DETECTED NOT DETECTED Final   Parainfluenza Virus 4 NOT DETECTED NOT DETECTED Final   Respiratory Syncytial Virus NOT DETECTED NOT DETECTED Final   Bordetella pertussis NOT DETECTED NOT DETECTED Final   Bordetella Parapertussis NOT DETECTED NOT DETECTED Final   Chlamydophila pneumoniae NOT DETECTED NOT DETECTED Final   Mycoplasma pneumoniae NOT DETECTED NOT DETECTED Final    Comment: Performed at [facility], [place]. [address] [place],  [address]     Scheduled Meds:  ALPRAZolam  0.5 mg Oral TID   arformoterol  15 mcg Nebulization BID   aspirin EC  81 mg Oral Daily   budesonide (PULMICORT) nebulizer solution  0.5 mg Nebulization BID   Chlorhexidine Gluconate Cloth  6 each Topical Q0600   clotrimazole  10 mg Oral 5 X Daily   fluticasone  2 spray Each Nare Daily   guaiFENesin  1,200 mg Oral BID   insulin aspart  0-6 Units Subcutaneous TID WC   levothyroxine  175 mcg Oral Q0600   methylPREDNISolone (SOLU-MEDROL) injection  80 mg Intravenous Q12H   morphine  15 mg Oral Q12H    morphine injection  1 mg Intravenous Once   mouth rinse  15 mL Mouth Rinse 4 times per day   pantoprazole  40 mg Oral BID   revefenacin  175 mcg Nebulization Daily   rosuvastatin  20 mg Oral Daily   senna-docusate  1 tablet Oral QHS   sertraline  100 mg Oral QHS   sodium chloride  2 spray Each Nare BID   Continuous Infusions:  Procedures/Studies: ECHOCARDIOGRAM COMPLETE  Result Date: 05/13/2022    ECHOCARDIOGRAM REPORT   Patient Name:   JUSTIS ATEN Date of Exam: 05/13/2022 Medical Rec #:  [id number]           Height:       62.0 in Accession #:    [id number]          Weight:       135.1 lb Date of Birth:  02-03-1956  BSA:          1.618 m Patient Age:    13 years            BP:           116/52 mmHg Patient Gender: F                   HR:           60 bpm. Exam Location:  [facility] Procedure: 2D Echo, Cardiac Doppler and Color Doppler Indications:    R07.9* Chest pain, unspecified; R06.02 SOB  History:        Patient has no prior history of Echocardiogram examinations.                 Abnormal ECG, COPD, Arrythmias:Tachycardia;                 Signs/Symptoms:Shortness of Breath, Dyspnea and Chest Pain.                 Pulmonary fibrosis.  Sonographer:    Roseanna Rainbow RDCS Referring Phys: [phone] Nathalie Cavendish  Sonographer Comments: Technically difficult study due to poor echo windows. Chest wall deformity. IMPRESSIONS  1. Left ventricular ejection fraction, by estimation, is 60 to 65%. The left ventricle has normal function. The left ventricle has no regional wall motion abnormalities. There is mild asymmetric left ventricular hypertrophy of the basal-septal segment. Left ventricular diastolic parameters are indeterminate. Elevated left atrial pressure.  2. Right ventricular systolic function is normal. The right ventricular size is normal. Tricuspid regurgitation signal is inadequate for assessing PA pressure.  3. Left atrial size was severely dilated.  4. The mitral valve is normal in structure. Trivial mitral valve regurgitation. No evidence of mitral stenosis.  5. The aortic valve is tricuspid. Aortic valve regurgitation is not visualized.  6. The inferior vena cava is dilated in size with >50% respiratory variability, suggesting right atrial pressure of 8 mmHg. Comparison(s): No prior Echocardiogram. FINDINGS  Left Ventricle: Left ventricular ejection fraction, by estimation, is 60 to 65%. The left ventricle has normal function. The left ventricle has no regional wall motion abnormalities. The left ventricular internal cavity size was small. There is mild asymmetric left ventricular hypertrophy of the basal-septal segment.  Left ventricular diastolic parameters are indeterminate. Elevated left atrial pressure. Right Ventricle: The right ventricular size is normal. No increase in right ventricular wall thickness. Right ventricular systolic function is normal. Tricuspid regurgitation signal is inadequate for assessing PA pressure. Left Atrium: Left atrial size was severely dilated. Right Atrium: Right atrial size was normal in size. Pericardium: There is no evidence of pericardial effusion. Mitral Valve: The mitral valve is normal in structure. Trivial mitral valve regurgitation. No evidence of mitral valve stenosis. Tricuspid Valve: The tricuspid valve is normal in structure. Tricuspid valve regurgitation is not demonstrated. No evidence of tricuspid stenosis. Aortic Valve: The aortic valve is tricuspid. Aortic valve regurgitation is not visualized. Pulmonic Valve: The pulmonic valve was not well visualized. Pulmonic valve regurgitation is not visualized. No evidence of pulmonic stenosis. Aorta: The aortic root and ascending aorta are structurally normal, with no evidence of dilitation. Venous: The inferior vena cava is dilated in size with greater than 50% respiratory variability, suggesting right atrial pressure of 8 mmHg. IAS/Shunts: The atrial septum is grossly normal.  LEFT VENTRICLE PLAX 2D LVIDd:         3.10 cm     Diastology LVIDs:  2.10 cm     LV e' medial:    6.53 cm/s LV PW:         1.00 cm     LV E/e' medial:  12.1 LV IVS:        1.30 cm     LV e' lateral:   8.05 cm/s LVOT diam:     2.20 cm     LV E/e' lateral: 9.9 LV SV:         129 LV SV Index:   80 LVOT Area:     3.80 cm  LV Volumes (MOD) LV vol d, MOD A2C: 75.6 ml LV vol d, MOD A4C: 82.1 ml LV vol s, MOD A2C: 25.9 ml LV vol s, MOD A4C: 29.6 ml LV SV MOD A2C:     49.7 ml LV SV MOD A4C:     82.1 ml LV SV MOD BP:      50.7 ml RIGHT VENTRICLE             IVC RV S prime:     15.80 cm/s  IVC diam: 2.20 cm TAPSE (M-mode): 1.4 cm LEFT ATRIUM             Index         RIGHT ATRIUM           Index LA diam:        3.90 cm 2.41 cm/m   RA Area:     13.00 cm LA Vol (A2C):   97.3 ml 60.12 ml/m  RA Volume:   25.30 ml  15.63 ml/m LA Vol (A4C):   80.3 ml 49.62 ml/m LA Biplane Vol: 93.7 ml 57.90 ml/m  AORTIC VALVE LVOT Vmax:   147.00 cm/s LVOT Vmean:  98.800 cm/s LVOT VTI:    0.340 m  AORTA Ao Root diam: 3.50 cm Ao Asc diam:  3.20 cm MITRAL VALVE MV Area (PHT): 5.38 cm    SHUNTS MV Decel Time: 141 msec    Systemic VTI:  0.34 m MV E velocity: 79.30 cm/s  Systemic Diam: 2.20 cm MV A velocity: 56.90 cm/s MV E/A ratio:  1.39 Rudean Haskell MD Electronically signed by Rudean Haskell MD Signature Date/Time: 05/13/2022/2:18:19 PM    Final    DG CHEST PORT 1 VIEW  Result Date: 05/13/2022 CLINICAL DATA:  67 year old female presents with respiratory distress. EXAM: PORTABLE CHEST 1 VIEW COMPARISON:  May 09, 2022 FINDINGS: EKG leads project over the chest. Cardiomediastinal contours and hilar structures are normal. Diffuse interstitial and airspace opacities are similar with slightly diminished lung volumes. No pneumothorax. On limited assessment no acute skeletal findings. IMPRESSION: Similar appearance of the chest accounting for slightly diminished lung volumes with findings of chronic interstitial lung disease. With this pattern it would be difficult to exclude superimposed infection or edema. Electronically Signed   By: Zetta Bills M.D.   On: 05/13/2022 12:32   CT Angio Chest/Abd/Pel for Dissection W and/or Wo Contrast  Result Date: 05/09/2022 CLINICAL DATA:  Chest pain radiating into the neck, initial encounter EXAM: CT ANGIOGRAPHY CHEST, ABDOMEN AND PELVIS TECHNIQUE: Non-contrast CT of the chest was initially obtained. Multidetector CT imaging through the chest, abdomen and pelvis was performed using the standard protocol during bolus administration of intravenous contrast. Multiplanar reconstructed images and MIPs were obtained and reviewed to evaluate the  vascular anatomy. RADIATION DOSE REDUCTION: This exam was performed according to the departmental dose-optimization program which includes automated exposure control, adjustment of the mA and/or kV according to patient  size and/or use of iterative reconstruction technique. CONTRAST:  121m OMNIPAQUE IOHEXOL 350 MG/ML SOLN COMPARISON:  07/15/2020 FINDINGS: CTA CHEST FINDINGS Cardiovascular: Initial precontrast images demonstrate atherosclerotic calcification of the thoracic aorta. No aneurysmal dilatation is seen. No hyperdense crescent to suggest acute aortic injury is noted. Postcontrast images were then obtained and reveal no evidence of aortic dissection. Heart is not significantly enlarged. The pulmonary artery shows a normal branching pattern bilaterally. No filling defect to suggest pulmonary embolism is noted. Coronary calcifications are noted. Mediastinum/Nodes: Thoracic inlet is within normal limits. Prominent AP window lymph node has resolved in the interval from the prior exam. Persistent right hilar lymph nodes are seen likely reactive in nature. The esophagus as visualized is within normal limits. Lungs/Pleura: Lungs demonstrate diffuse fibrotic changes and honeycombing particularly in the bases. The previously seen diffuse superimposed infiltrates have resolved. There is a 4 mm nodule in the right upper lobe on image number 26 of series 9. No other sizable nodules are seen. No effusions are noted. Musculoskeletal: Degenerative changes of the thoracic spine are noted. No acute rib abnormality is seen. Chronic compression deformities of T5 and T7 are noted. Review of the MIP images confirms the above findings. CTA ABDOMEN AND PELVIS FINDINGS VASCULAR Aorta: Atherosclerotic calcifications are noted without aneurysmal dilatation or dissection. Celiac: Patent without evidence of aneurysm, dissection, vasculitis or significant stenosis. SMA: Patent without evidence of aneurysm, dissection, vasculitis or  significant stenosis. Renals: Both renal arteries are patent without evidence of aneurysm, dissection, vasculitis, fibromuscular dysplasia or significant stenosis. IMA: Patent without evidence of aneurysm, dissection, vasculitis or significant stenosis. Inflow: Iliacs demonstrate mild atherosclerotic calcification although no focal stenosis is seen. Veins: Visualized venous structures appear within normal limits. IVC filter is noted in satisfactory position. Review of the MIP images confirms the above findings. NON-VASCULAR Hepatobiliary: Gallbladder has been surgically removed. Liver shows no focal abnormality. Pancreas: Unremarkable. No pancreatic ductal dilatation or surrounding inflammatory changes. Spleen: Normal in size without focal abnormality. Adrenals/Urinary Tract: Adrenal glands are within normal limits. Kidneys demonstrate a normal enhancement pattern bilaterally. No renal calculi or obstructive changes are seen. The bladder is partially distended. Stomach/Bowel: Fecal material is noted scattered throughout the colon. No obstructive or inflammatory changes are seen. The appendix is not well visualized and may have been surgically. No inflammatory changes are seen. The small bowel and stomach are unremarkable. Lymphatic: No lymphadenopathy is noted. Reproductive: Status post hysterectomy. No adnexal masses. Other: No abdominal wall hernia or abnormality. No abdominopelvic ascites. Musculoskeletal: Postsurgical changes are noted in the lower lumbar spine. Prior vertebral augmentation at L1 is seen. Chronic L3 compression fracture is noted. Review of the MIP images confirms the above findings. IMPRESSION: CTA of the chest: No evidence of aortic injury. No pulmonary embolism is seen. Persistent right hilar lymph nodes likely reactive in nature. Chronic fibrotic changes are noted in the lungs bilaterally similar to prior exam. 4 mm right solid pulmonary nodule within the upper lobe. Per Fleischner Society  Guidelines, a non-contrast Chest CT at 12 months is optional. If performed and the nodule is stable at 12 months, no further follow-up is recommended. These guidelines do not apply to immunocompromised patients and patients with cancer. Follow up in patients with significant comorbidities as clinically warranted. For lung cancer screening, adhere to Lung-RADS guidelines. Reference: Radiology. 2017; 284(1):228-43. CTA of the abdomen and pelvis: No aortic abnormality is noted. No acute abnormality is seen. Electronically Signed   By: MInez CatalinaM.D.   On: 05/09/2022  23:40   CT Head Wo Contrast  Result Date: 05/09/2022 CLINICAL DATA:  New onset headache EXAM: CT HEAD WITHOUT CONTRAST TECHNIQUE: Contiguous axial images were obtained from the base of the skull through the vertex without intravenous contrast. RADIATION DOSE REDUCTION: This exam was performed according to the departmental dose-optimization program which includes automated exposure control, adjustment of the mA and/or kV according to patient size and/or use of iterative reconstruction technique. COMPARISON:  None Available. FINDINGS: Brain: There is no mass, hemorrhage or extra-axial collection. The size and configuration of the ventricles and extra-axial CSF spaces are normal. The brain parenchyma is normal, without acute or chronic infarction. Vascular: No abnormal hyperdensity of the major intracranial arteries or dural venous sinuses. No intracranial atherosclerosis. Skull: The visualized skull base, calvarium and extracranial soft tissues are normal. Sinuses/Orbits: No fluid levels or advanced mucosal thickening of the visualized paranasal sinuses. No mastoid or middle ear effusion. The orbits are normal. IMPRESSION: Normal head CT. Electronically Signed   By: Ulyses Jarred M.D.   On: 05/09/2022 23:22   DG Chest Portable 1 View  Result Date: 05/09/2022 CLINICAL DATA:  Chest pain and shortness of breath EXAM: PORTABLE CHEST 1 VIEW COMPARISON:   07/15/2020 FINDINGS: Cardiac shadow is stable. Diffuse fibrotic changes are identified throughout both lungs. No focal infiltrate or sizable effusion is seen. No bony abnormality is noted. IMPRESSION: Changes consistent with pulmonary fibrosis without acute abnormality. Electronically Signed   By: Inez Catalina M.D.   On: 05/09/2022 22:03    Orson Eva, DO  [facility]  If 7PM-7AM, please contact night-coverage [email or web address] Password [facility] 05/15/2022, 8:18 AM   LOS: 5 days

## 2022-05-15 NOTE — Progress Notes (Signed)
Palliative: [name] is resting quietly in bed.  The cell phone held to her ER.  She appears acutely/chronically ill and very frail.  She is alert and oriented, able to make her needs known.  There is no family at bedside at this time.  Somewhat limited i visit today as Mrs. Danelle Earthly shares that she is on a business call.  We briefly talk about her bowels and a bowel regimen.  Conference with bedside nursing staff.  Conference with attending, bedside nursing staff, transition of care team related to patient condition, needs, goals of care, disposition.  Plan: At this point continue full scope/full code.  Time for outcomes.  Unsure of discharge needs.  Bowel regimen reviewed/adjusted.  59 minutes  Quinn Axe, NP Palliative medicine team Team phone [phone] Greater than 50% of this time was spent counseling and coordinating care related to the above assessment and plan.  Just

## 2022-05-16 DIAGNOSIS — J9621 Acute and chronic respiratory failure with hypoxia: Secondary | ICD-10-CM | POA: Diagnosis not present

## 2022-05-16 DIAGNOSIS — F411 Generalized anxiety disorder: Secondary | ICD-10-CM | POA: Diagnosis not present

## 2022-05-16 DIAGNOSIS — K5900 Constipation, unspecified: Secondary | ICD-10-CM

## 2022-05-16 DIAGNOSIS — E039 Hypothyroidism, unspecified: Secondary | ICD-10-CM | POA: Diagnosis not present

## 2022-05-16 DIAGNOSIS — J84112 Idiopathic pulmonary fibrosis: Secondary | ICD-10-CM | POA: Diagnosis not present

## 2022-05-16 LAB — GLUCOSE, CAPILLARY
Glucose-Capillary: 95 mg/dL (ref 70–99)
Glucose-Capillary: 155 mg/dL — ABNORMAL HIGH (ref 70–99)
Glucose-Capillary: 116 mg/dL — ABNORMAL HIGH (ref 70–99)
Glucose-Capillary: 169 mg/dL — ABNORMAL HIGH (ref 70–99)

## 2022-05-16 MED ORDER — DOCUSATE SODIUM 100 MG PO CAPS
200.0000 mg | ORAL_CAPSULE | Freq: Two times a day (BID) | ORAL | Status: DC
  Administered 2022-05-16 – 2022-05-24 (×16): 200 mg via ORAL
  Filled 2022-05-16 (×16): qty 2

## 2022-05-16 MED ORDER — MORPHINE SULFATE (PF) 2 MG/ML IV SOLN
1.0000 mg | INTRAVENOUS | Status: DC | PRN
  Administered 2022-05-16 – 2022-05-23 (×14): 1 mg via INTRAVENOUS
  Filled 2022-05-16 (×14): qty 1

## 2022-05-16 MED ORDER — PROCHLORPERAZINE EDISYLATE 10 MG/2ML IJ SOLN
10.0000 mg | Freq: Four times a day (QID) | INTRAMUSCULAR | Status: DC | PRN
  Administered 2022-05-16 – 2022-05-24 (×18): 10 mg via INTRAVENOUS
  Filled 2022-05-16 (×18): qty 2

## 2022-05-16 NOTE — Progress Notes (Signed)
Per night shift RT patient did not wear BIPAP dream station last night instead patient remained on 13L HFNC and tolerated well. This morning O2 weaned down form 13L to 11L and sats remained at 97%. Will continue to wean O2 as tolerated.

## 2022-05-16 NOTE — Progress Notes (Signed)
Dreamstation BIPAP in room. Patient said she doesn't plan to wear tonight. Will call if she changes her mind.

## 2022-05-16 NOTE — Progress Notes (Signed)
Palliative: Chart review completed.  Face-to-face discussion with bedside nursing staff related to patient condition, bowel regimen.  No needs identified at this time.  Overall, her respiratory status remained stable and she is needing less oxygen.  She uses her nighttime BiPAP at her pleasure.  Still no BM in 7 days per bedside nursing staff.  Bowel regimen discussed, encouraged nursing staff to check for impaction.  Increased Colace.  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  Plan: Continue full scope/full code.  Time for outcomes.  Anticipate home with home health if needed.  No charge    Quinn Axe, NP Palliative medicine team Team phone [phone] Greater than 50% of this time was spent counseling and coordinating care related to the above assessment and plan.

## 2022-05-16 NOTE — Progress Notes (Addendum)
PROGRESS NOTE  [name] Fitzpatrick [id number] DOB: 05-14-1955 DOA: 05/09/2022 PCP: Joya Gaskins, FNP  Brief History:  67 year old female with a history of chronic respiratory failure on 6 L, pulmonary fibrosis, diabetes mellitus type 2, anemia of chronic disease, anxiety, hypertension presenting with worsening cough and shortness of breath over the last 2 to 3 days.  The patient states that she has been having some headaches which began on 05/04/2022 followed by some emesis.  She states that her emesis has improved, unfortunately, her shortness of breath and coughing have worsened.  She has had some fevers at home up to 101.2 F.  She denies any hemoptysis, abdominal pain, diarrhea, hematochezia, melena.  She denies any focal extremity weakness or visual loss. She denies any worsening lower extremity edema. In the ED, the patient was afebrile with soft blood pressures in the 90s.  WBC 11.8, hemoglobin 10.2, platelets 222,000. BMP showed sodium 134, potassium 4.1, bicarbonate 26, BUN 20, creatinine 0.74.  LFTs unremarkable.  BNP 274.  CTA chest was negative for PE, negative dissection.  There is diffuse fibrotic changes with honeycombing.  There is a right upper lobe 4 mm nodule.  CTA abdomen pelvis was negative for any acute findings.  EKG shows sinus rhythm with incomplete right bundle branch block.  Troponin 11>>   Assessment/Plan: Acute on chronic respiratory failure with hypoxia -Secondary to exacerbation of pulmonary fibrosis/COPD. -Suspect underlying viral infection -Chronically on 6 L nasal cannula at home -On 15 L high flow>>wean for saturation 88-95%>>13L -05/09/2022 CTA chest negative for PE, consolidation -Pulmonary consult appreciated -Continue IV Solu-Medrol -continue Pulmicort, Maretta Bees and Brovana -COVID-19 PCR negative, RSV negative, influenza negative -check PCT <0.10 -viral resp panel--neg 2/27 AM--had episode respiratory distress with CP --repeat  CXR --morphine IV 1 mg for episodes of distress -- Continue as needed Hycodan --2/25 Echo--EF 60-65%, no WMA, normal RVF, trivial MR --check troponins, bmp, cbc. -Patient-overall condition guarded and poor.  Per her reports outpatient pulmonologist and cardiologist has expressed region terminal illness and she used to be on home hospice.   COPD exacerbation -continue IV solumedrol -continue Yupelri, brovana, pulmicort -Continue to wean off oxygen supplementation as tolerated.   Anxiety disorder -Continue home dose alprazolam, Zoloft -continue ativan prn   Opioid dependence -PDMP reviewed--patient receives morphine ER 15 mg, 90 monthly -Patient receives Dilaudid 4 mg, #120 monthly -Patient has been continue on extended release morphine every 12 hours, as needed Dilaudid and also IV morphine for breakthrough.   Hypothyroidism -Continue Synthroid   Diabetes mellitus type 2 -Appears to be managed by lifestyle modification -NovoLog sliding scale -Checked A1c--5.3 -CBGs controlled for the most part.  Continue to follow trend.   Mixed hyperlipidemia -continue statin   GOC -Patient has expressed wanting full scope treatment practice and resuscitation if required -Appreciate assistance and recommendation by palliative care.   Subjective: Complaining of intermittent nausea and ongoing pleuritic chest pain.  No vomiting, no fever, no chills, still short winded, experiencing difficulty speaking in full sentences and requiring 11-12 L high flow nasal cannula supplementation.  Objective: Vitals:   05/16/22 1500 05/16/22 1613 05/16/22 1700 05/16/22 1800  BP: (!) 103/51  (!) 104/58 (!) 144/85  Pulse: 73 72 64 88  Resp: '12 15 14 '$ (!) 21  Temp:  (!) 97.3 F (36.3 C)    TempSrc:  Oral    SpO2: 97% 100% 97% 95%  Weight:      Height:  Intake/Output Summary (Last 24 hours) at 05/16/2022 1834 Last data filed at 05/16/2022 0800 Gross per 24 hour  Intake --  Output 1700 ml  Net  -1700 ml   Weight change: -2.7 kg Exam: General exam: Alert, awake, oriented x 3; following commands appropriately; short winded with minimal activity and is still requiring around 11 L of high flow nasal cannula. Respiratory system: Fair air movement bilaterally; positive rhonchi at and expiratory wheezing on auscultation.  Intermittent tachypnea. Cardiovascular system:RRR. No rubs or gallops; no JVD. Gastrointestinal system: Abdomen is nondistended, soft and nontender. No organomegaly or masses felt. Normal bowel sounds heard. Central nervous system: Alert and oriented. No focal neurological deficits. Extremities: No cyanosis, positive clubbing.  Trace lower extremity edema bilaterally appreciated. Skin: No rashes, lesions or ulcers Psychiatry: Judgement and insight appear normal.  Flat affect.  Data Reviewed: I have personally reviewed following labs and imaging studies  Basic Metabolic Panel: Recent Labs  Lab 05/10/22 0550 05/11/22 0502 05/12/22 0219 05/13/22 1245 05/15/22 0851  NA 132* 136 137 134* 133*  K 4.2 4.1 4.1 3.9 4.3  CL 99 103 101 97* 93*  CO2 '24 28 28 29 '$ 34*  GLUCOSE 157* 157* 124* 160* 108*  BUN '21 14 15 16 14  '$ CREATININE 0.66 0.53 0.62 0.55 0.50  CALCIUM 8.9 8.9 8.8* 8.6* 8.6*  MG 1.8 1.8 1.8 1.7 1.8  PHOS 3.9  --  3.3 3.0 3.8   Liver Function Tests: Recent Labs  Lab 05/09/22 2201 05/10/22 0550 05/13/22 1245  AST '18 18 16  '$ ALT '13 13 15  '$ ALKPHOS 57 58 46  BILITOT 0.4 0.5 0.4  PROT 6.7 6.8 5.7*  ALBUMIN 3.6 3.5 2.9*   Recent Labs  Lab 05/09/22 2201  LIPASE 25   Coagulation Profile: Recent Labs  Lab 05/09/22 2201  INR 1.0   CBC: Recent Labs  Lab 05/09/22 2201 05/10/22 0550 05/12/22 0422 05/13/22 1245 05/15/22 0851  WBC 11.8* 6.7 10.0 10.5 10.4  NEUTROABS 9.5* 6.1  --   --   --   HGB 10.2* 10.1* 9.6* 9.9* 10.6*  HCT 32.6* 31.6* 31.4* 31.6* 33.9*  MCV 89.3 89.8 93.2 90.0 90.2  PLT 222 186 196 207 227   CBG: Recent Labs  Lab  05/15/22 1545 05/15/22 2120 05/16/22 0749 05/16/22 1116 05/16/22 1613  GLUCAP 138* 159* 95 155* 116*   Urine analysis:    Component Value Date/Time   COLORURINE YELLOW 05/10/2022 1550   APPEARANCEUR HAZY (A) 05/10/2022 1550   LABSPEC >1.046 (H) 05/10/2022 1550   PHURINE 5.0 05/10/2022 1550   GLUCOSEU NEGATIVE 05/10/2022 1550   HGBUR NEGATIVE 05/10/2022 1550   HGBUR negative 05/17/2006 0000   BILIRUBINUR NEGATIVE 05/10/2022 1550   BILIRUBINUR small 03/30/2015 1546   KETONESUR 5 (A) 05/10/2022 1550   PROTEINUR 30 (A) 05/10/2022 1550   UROBILINOGEN 0.2 03/30/2015 1546   UROBILINOGEN 0.2 11/08/2013 0237   NITRITE NEGATIVE 05/10/2022 1550   LEUKOCYTESUR NEGATIVE 05/10/2022 1550   Sepsis Labs:  Recent Results (from the past 240 hour(s))  Resp panel by RT-PCR (RSV, Flu A&B, Covid) Anterior Nasal Swab     Status: None   Collection Time: 05/09/22  9:55 PM   Specimen: Anterior Nasal Swab  Result Value Ref Range Status   SARS Coronavirus 2 by RT PCR NEGATIVE NEGATIVE Final    Comment: (NOTE) SARS-CoV-2 target nucleic acids are NOT DETECTED.  The SARS-CoV-2 RNA is generally detectable in upper respiratory specimens during the acute phase of infection. The lowest  concentration of SARS-CoV-2 viral copies this assay can detect is 138 copies/mL. A negative result does not preclude SARS-Cov-2 infection and should not be used as the sole basis for treatment or other patient management decisions. A negative result may occur with  improper specimen collection/handling, submission of specimen other than nasopharyngeal swab, presence of viral mutation(s) within the areas targeted by this assay, and inadequate number of viral copies(<138 copies/mL). A negative result must be combined with clinical observations, patient history, and epidemiological information. The expected result is Negative.  Fact Sheet for Patients:  EntrepreneurPulse.com.au  Fact Sheet for  Healthcare Providers:  IncredibleEmployment.be  This test is no t yet approved or cleared by the [place] FDA and  has been authorized for detection and/or diagnosis of SARS-CoV-2 by FDA under an Emergency Use Authorization (EUA). This EUA will remain  in effect (meaning this test can be used) for the duration of the COVID-19 declaration under Section 564(b)(1) of the Act, 21 U.S.C.section 360bbb-3(b)(1), unless the authorization is terminated  or revoked sooner.       Influenza A by PCR NEGATIVE NEGATIVE Final   Influenza B by PCR NEGATIVE NEGATIVE Final    Comment: (NOTE) The Xpert Xpress SARS-CoV-2/FLU/RSV plus assay is intended as an aid in the diagnosis of influenza from Nasopharyngeal swab specimens and should not be used as a sole basis for treatment. Nasal washings and aspirates are unacceptable for Xpert Xpress SARS-CoV-2/FLU/RSV testing.  Fact Sheet for Patients: EntrepreneurPulse.com.au  Fact Sheet for Healthcare Providers: IncredibleEmployment.be  This test is not yet approved or cleared by the [place] FDA and has been authorized for detection and/or diagnosis of SARS-CoV-2 by FDA under an Emergency Use Authorization (EUA). This EUA will remain in effect (meaning this test can be used) for the duration of the COVID-19 declaration under Section 564(b)(1) of the Act, 21 U.S.C. section 360bbb-3(b)(1), unless the authorization is terminated or revoked.     Resp Syncytial Virus by PCR NEGATIVE NEGATIVE Final    Comment: (NOTE) Fact Sheet for Patients: EntrepreneurPulse.com.au  Fact Sheet for Healthcare Providers: IncredibleEmployment.be  This test is not yet approved or cleared by the [place] FDA and has been authorized for detection and/or diagnosis of SARS-CoV-2 by FDA under an Emergency Use Authorization (EUA). This EUA will remain in effect (meaning this  test can be used) for the duration of the COVID-19 declaration under Section 564(b)(1) of the Act, 21 U.S.C. section 360bbb-3(b)(1), unless the authorization is terminated or revoked.  Performed at [facility], [address], [place], [place] [address]   MRSA Next Gen by PCR, Nasal     Status: None   Collection Time: 05/10/22  4:15 AM   Specimen: Nasal Mucosa; Nasal Swab  Result Value Ref Range Status   MRSA by PCR Next Gen NOT DETECTED NOT DETECTED Final    Comment: (NOTE) The GeneXpert MRSA Assay (FDA approved for NASAL specimens only), is one component of a comprehensive MRSA colonization surveillance program. It is not intended to diagnose MRSA infection nor to guide or monitor treatment for MRSA infections. Test performance is not FDA approved in patients less than 22 years old. Performed at [facility], [address], [place], [place] [address]   Culture, blood (Routine X 2) w Reflex to ID Panel     Status: None   Collection Time: 05/10/22  8:39 AM   Specimen: Right Antecubital; Blood  Result Value Ref Range Status   Specimen Description RIGHT ANTECUBITAL  Final   Special  Requests   Final    BOTTLES DRAWN AEROBIC AND ANAEROBIC Blood Culture adequate volume   Culture   Final    NO GROWTH 5 DAYS Performed at [facility], [address], [place], [place] [address]    Report Status 05/15/2022 FINAL  Final  Culture, blood (Routine X 2) w Reflex to ID Panel     Status: None   Collection Time: 05/10/22  8:39 AM   Specimen: BLOOD RIGHT ARM  Result Value Ref Range Status   Specimen Description BLOOD RIGHT ARM  Final   Special Requests   Final    BOTTLES DRAWN AEROBIC AND ANAEROBIC Blood Culture results may not be optimal due to an excessive volume of blood received in culture bottles   Culture   Final    NO GROWTH 5 DAYS Performed at [facility], [address], [place], [place] [address]    Report Status 05/15/2022 FINAL  Final  Respiratory (~20 pathogens) panel by PCR      Status: None   Collection Time: 05/10/22  9:30 AM   Specimen: Nasopharyngeal Swab; Respiratory  Result Value Ref Range Status   Adenovirus NOT DETECTED NOT DETECTED Final   Coronavirus 229E NOT DETECTED NOT DETECTED Final    Comment: (NOTE) The Coronavirus on the Respiratory Panel, DOES NOT test for the novel  Coronavirus (2019 nCoV)    Coronavirus HKU1 NOT DETECTED NOT DETECTED Final   Coronavirus NL63 NOT DETECTED NOT DETECTED Final   Coronavirus OC43 NOT DETECTED NOT DETECTED Final   Metapneumovirus NOT DETECTED NOT DETECTED Final   Rhinovirus / Enterovirus NOT DETECTED NOT DETECTED Final   Influenza A NOT DETECTED NOT DETECTED Final   Influenza B NOT DETECTED NOT DETECTED Final   Parainfluenza Virus 1 NOT DETECTED NOT DETECTED Final   Parainfluenza Virus 2 NOT DETECTED NOT DETECTED Final   Parainfluenza Virus 3 NOT DETECTED NOT DETECTED Final   Parainfluenza Virus 4 NOT DETECTED NOT DETECTED Final   Respiratory Syncytial Virus NOT DETECTED NOT DETECTED Final   Bordetella pertussis NOT DETECTED NOT DETECTED Final   Bordetella Parapertussis NOT DETECTED NOT DETECTED Final   Chlamydophila pneumoniae NOT DETECTED NOT DETECTED Final   Mycoplasma pneumoniae NOT DETECTED NOT DETECTED Final    Comment: Performed at [facility], [place] [address], [place],  [address]     Scheduled Meds:  ALPRAZolam  0.5 mg Oral TID   arformoterol  15 mcg Nebulization BID   aspirin EC  81 mg Oral Daily   budesonide (PULMICORT) nebulizer solution  0.5 mg Nebulization BID   Chlorhexidine Gluconate Cloth  6 each Topical Q0600   clotrimazole  10 mg Oral 5 X Daily   docusate sodium  200 mg Oral BID   fluticasone  2 spray Each Nare Daily   guaiFENesin  1,200 mg Oral BID   insulin aspart  0-6 Units Subcutaneous TID WC   levothyroxine  175 mcg Oral Q0600   methylPREDNISolone (SOLU-MEDROL) injection  80 mg Intravenous Q12H   morphine  15 mg Oral Q12H   mouth rinse  15 mL Mouth Rinse 4 times per  day   pantoprazole  40 mg Oral BID   revefenacin  175 mcg Nebulization Daily   rosuvastatin  20 mg Oral Daily   senna-docusate  1 tablet Oral QHS   sertraline  100 mg Oral QHS   sodium chloride  2 spray Each Nare BID   Continuous Infusions:  Procedures/Studies: DG CHEST PORT 1 VIEW  Result  Date: 05/15/2022 CLINICAL DATA:  Respiratory distress EXAM: PORTABLE CHEST 1 VIEW COMPARISON:  05/13/2022.  CTA chest of 05/09/2022 FINDINGS: Midline trachea. Normal heart size. No pleural effusion or pneumothorax. Underlying basilar and peripheral predominant accentuated pulmonary interstitium, consistent with pulmonary fibrosis. No convincing evidence of acute superimposed lobar consolidation. IMPRESSION: Severe interstitial lung disease. This limits sensitivity of plain films. No convincing evidence of acute superimposed process. Electronically Signed   By: Abigail Miyamoto M.D.   On: 05/15/2022 08:50   ECHOCARDIOGRAM COMPLETE  Result Date: 05/13/2022    ECHOCARDIOGRAM REPORT   Patient Name:   BENIGNA CENTRELLA Date of Exam: 05/13/2022 Medical Rec #:  [id number]           Height:       62.0 in Accession #:    [id number]          Weight:       135.1 lb Date of Birth:  1955-06-26           BSA:          1.618 m Patient Age:    3 years            BP:           116/52 mmHg Patient Gender: F                   HR:           60 bpm. Exam Location:  [facility] Procedure: 2D Echo, Cardiac Doppler and Color Doppler Indications:    R07.9* Chest pain, unspecified; R06.02 SOB  History:        Patient has no prior history of Echocardiogram examinations.                 Abnormal ECG, COPD, Arrythmias:Tachycardia;                 Signs/Symptoms:Shortness of Breath, Dyspnea and Chest Pain.                 Pulmonary fibrosis.  Sonographer:    Roseanna Rainbow RDCS Referring Phys: [phone] DAVID TAT  Sonographer Comments: Technically difficult study due to poor echo windows. Chest wall deformity. IMPRESSIONS  1. Left ventricular ejection  fraction, by estimation, is 60 to 65%. The left ventricle has normal function. The left ventricle has no regional wall motion abnormalities. There is mild asymmetric left ventricular hypertrophy of the basal-septal segment. Left ventricular diastolic parameters are indeterminate. Elevated left atrial pressure.  2. Right ventricular systolic function is normal. The right ventricular size is normal. Tricuspid regurgitation signal is inadequate for assessing PA pressure.  3. Left atrial size was severely dilated.  4. The mitral valve is normal in structure. Trivial mitral valve regurgitation. No evidence of mitral stenosis.  5. The aortic valve is tricuspid. Aortic valve regurgitation is not visualized.  6. The inferior vena cava is dilated in size with >50% respiratory variability, suggesting right atrial pressure of 8 mmHg. Comparison(s): No prior Echocardiogram. FINDINGS  Left Ventricle: Left ventricular ejection fraction, by estimation, is 60 to 65%. The left ventricle has normal function. The left ventricle has no regional wall motion abnormalities. The left ventricular internal cavity size was small. There is mild asymmetric left ventricular hypertrophy of the basal-septal segment. Left ventricular diastolic parameters are indeterminate. Elevated left atrial pressure. Right Ventricle: The right ventricular size is normal. No increase in right ventricular wall thickness. Right ventricular systolic function is normal. Tricuspid regurgitation signal is inadequate for assessing  PA pressure. Left Atrium: Left atrial size was severely dilated. Right Atrium: Right atrial size was normal in size. Pericardium: There is no evidence of pericardial effusion. Mitral Valve: The mitral valve is normal in structure. Trivial mitral valve regurgitation. No evidence of mitral valve stenosis. Tricuspid Valve: The tricuspid valve is normal in structure. Tricuspid valve regurgitation is not demonstrated. No evidence of tricuspid  stenosis. Aortic Valve: The aortic valve is tricuspid. Aortic valve regurgitation is not visualized. Pulmonic Valve: The pulmonic valve was not well visualized. Pulmonic valve regurgitation is not visualized. No evidence of pulmonic stenosis. Aorta: The aortic root and ascending aorta are structurally normal, with no evidence of dilitation. Venous: The inferior vena cava is dilated in size with greater than 50% respiratory variability, suggesting right atrial pressure of 8 mmHg. IAS/Shunts: The atrial septum is grossly normal.  LEFT VENTRICLE PLAX 2D LVIDd:         3.10 cm     Diastology LVIDs:         2.10 cm     LV e' medial:    6.53 cm/s LV PW:         1.00 cm     LV E/e' medial:  12.1 LV IVS:        1.30 cm     LV e' lateral:   8.05 cm/s LVOT diam:     2.20 cm     LV E/e' lateral: 9.9 LV SV:         129 LV SV Index:   80 LVOT Area:     3.80 cm  LV Volumes (MOD) LV vol d, MOD A2C: 75.6 ml LV vol d, MOD A4C: 82.1 ml LV vol s, MOD A2C: 25.9 ml LV vol s, MOD A4C: 29.6 ml LV SV MOD A2C:     49.7 ml LV SV MOD A4C:     82.1 ml LV SV MOD BP:      50.7 ml RIGHT VENTRICLE             IVC RV S prime:     15.80 cm/s  IVC diam: 2.20 cm TAPSE (M-mode): 1.4 cm LEFT ATRIUM             Index        RIGHT ATRIUM           Index LA diam:        3.90 cm 2.41 cm/m   RA Area:     13.00 cm LA Vol (A2C):   97.3 ml 60.12 ml/m  RA Volume:   25.30 ml  15.63 ml/m LA Vol (A4C):   80.3 ml 49.62 ml/m LA Biplane Vol: 93.7 ml 57.90 ml/m  AORTIC VALVE LVOT Vmax:   147.00 cm/s LVOT Vmean:  98.800 cm/s LVOT VTI:    0.340 m  AORTA Ao Root diam: 3.50 cm Ao Asc diam:  3.20 cm MITRAL VALVE MV Area (PHT): 5.38 cm    SHUNTS MV Decel Time: 141 msec    Systemic VTI:  0.34 m MV E velocity: 79.30 cm/s  Systemic Diam: 2.20 cm MV A velocity: 56.90 cm/s MV E/A ratio:  1.39 Rudean Haskell MD Electronically signed by Rudean Haskell MD Signature Date/Time: 05/13/2022/2:18:19 PM    Final    DG CHEST PORT 1 VIEW  Result Date:  05/13/2022 CLINICAL DATA:  67 year old female presents with respiratory distress. EXAM: PORTABLE CHEST 1 VIEW COMPARISON:  May 09, 2022 FINDINGS: EKG leads project over the chest. Cardiomediastinal contours and hilar structures  are normal. Diffuse interstitial and airspace opacities are similar with slightly diminished lung volumes. No pneumothorax. On limited assessment no acute skeletal findings. IMPRESSION: Similar appearance of the chest accounting for slightly diminished lung volumes with findings of chronic interstitial lung disease. With this pattern it would be difficult to exclude superimposed infection or edema. Electronically Signed   By: Zetta Bills M.D.   On: 05/13/2022 12:32   CT Angio Chest/Abd/Pel for Dissection W and/or Wo Contrast  Result Date: 05/09/2022 CLINICAL DATA:  Chest pain radiating into the neck, initial encounter EXAM: CT ANGIOGRAPHY CHEST, ABDOMEN AND PELVIS TECHNIQUE: Non-contrast CT of the chest was initially obtained. Multidetector CT imaging through the chest, abdomen and pelvis was performed using the standard protocol during bolus administration of intravenous contrast. Multiplanar reconstructed images and MIPs were obtained and reviewed to evaluate the vascular anatomy. RADIATION DOSE REDUCTION: This exam was performed according to the departmental dose-optimization program which includes automated exposure control, adjustment of the mA and/or kV according to patient size and/or use of iterative reconstruction technique. CONTRAST:  181m OMNIPAQUE IOHEXOL 350 MG/ML SOLN COMPARISON:  07/15/2020 FINDINGS: CTA CHEST FINDINGS Cardiovascular: Initial precontrast images demonstrate atherosclerotic calcification of the thoracic aorta. No aneurysmal dilatation is seen. No hyperdense crescent to suggest acute aortic injury is noted. Postcontrast images were then obtained and reveal no evidence of aortic dissection. Heart is not significantly enlarged. The pulmonary artery  shows a normal branching pattern bilaterally. No filling defect to suggest pulmonary embolism is noted. Coronary calcifications are noted. Mediastinum/Nodes: Thoracic inlet is within normal limits. Prominent AP window lymph node has resolved in the interval from the prior exam. Persistent right hilar lymph nodes are seen likely reactive in nature. The esophagus as visualized is within normal limits. Lungs/Pleura: Lungs demonstrate diffuse fibrotic changes and honeycombing particularly in the bases. The previously seen diffuse superimposed infiltrates have resolved. There is a 4 mm nodule in the right upper lobe on image number 26 of series 9. No other sizable nodules are seen. No effusions are noted. Musculoskeletal: Degenerative changes of the thoracic spine are noted. No acute rib abnormality is seen. Chronic compression deformities of T5 and T7 are noted. Review of the MIP images confirms the above findings. CTA ABDOMEN AND PELVIS FINDINGS VASCULAR Aorta: Atherosclerotic calcifications are noted without aneurysmal dilatation or dissection. Celiac: Patent without evidence of aneurysm, dissection, vasculitis or significant stenosis. SMA: Patent without evidence of aneurysm, dissection, vasculitis or significant stenosis. Renals: Both renal arteries are patent without evidence of aneurysm, dissection, vasculitis, fibromuscular dysplasia or significant stenosis. IMA: Patent without evidence of aneurysm, dissection, vasculitis or significant stenosis. Inflow: Iliacs demonstrate mild atherosclerotic calcification although no focal stenosis is seen. Veins: Visualized venous structures appear within normal limits. IVC filter is noted in satisfactory position. Review of the MIP images confirms the above findings. NON-VASCULAR Hepatobiliary: Gallbladder has been surgically removed. Liver shows no focal abnormality. Pancreas: Unremarkable. No pancreatic ductal dilatation or surrounding inflammatory changes. Spleen: Normal  in size without focal abnormality. Adrenals/Urinary Tract: Adrenal glands are within normal limits. Kidneys demonstrate a normal enhancement pattern bilaterally. No renal calculi or obstructive changes are seen. The bladder is partially distended. Stomach/Bowel: Fecal material is noted scattered throughout the colon. No obstructive or inflammatory changes are seen. The appendix is not well visualized and may have been surgically. No inflammatory changes are seen. The small bowel and stomach are unremarkable. Lymphatic: No lymphadenopathy is noted. Reproductive: Status post hysterectomy. No adnexal masses. Other: No abdominal wall hernia or abnormality.  No abdominopelvic ascites. Musculoskeletal: Postsurgical changes are noted in the lower lumbar spine. Prior vertebral augmentation at L1 is seen. Chronic L3 compression fracture is noted. Review of the MIP images confirms the above findings. IMPRESSION: CTA of the chest: No evidence of aortic injury. No pulmonary embolism is seen. Persistent right hilar lymph nodes likely reactive in nature. Chronic fibrotic changes are noted in the lungs bilaterally similar to prior exam. 4 mm right solid pulmonary nodule within the upper lobe. Per Fleischner Society Guidelines, a non-contrast Chest CT at 12 months is optional. If performed and the nodule is stable at 12 months, no further follow-up is recommended. These guidelines do not apply to immunocompromised patients and patients with cancer. Follow up in patients with significant comorbidities as clinically warranted. For lung cancer screening, adhere to Lung-RADS guidelines. Reference: Radiology. 2017; 284(1):228-43. CTA of the abdomen and pelvis: No aortic abnormality is noted. No acute abnormality is seen. Electronically Signed   By: Inez Catalina M.D.   On: 05/09/2022 23:40   CT Head Wo Contrast  Result Date: 05/09/2022 CLINICAL DATA:  New onset headache EXAM: CT HEAD WITHOUT CONTRAST TECHNIQUE: Contiguous axial images  were obtained from the base of the skull through the vertex without intravenous contrast. RADIATION DOSE REDUCTION: This exam was performed according to the departmental dose-optimization program which includes automated exposure control, adjustment of the mA and/or kV according to patient size and/or use of iterative reconstruction technique. COMPARISON:  None Available. FINDINGS: Brain: There is no mass, hemorrhage or extra-axial collection. The size and configuration of the ventricles and extra-axial CSF spaces are normal. The brain parenchyma is normal, without acute or chronic infarction. Vascular: No abnormal hyperdensity of the major intracranial arteries or dural venous sinuses. No intracranial atherosclerosis. Skull: The visualized skull base, calvarium and extracranial soft tissues are normal. Sinuses/Orbits: No fluid levels or advanced mucosal thickening of the visualized paranasal sinuses. No mastoid or middle ear effusion. The orbits are normal. IMPRESSION: Normal head CT. Electronically Signed   By: Ulyses Jarred M.D.   On: 05/09/2022 23:22   DG Chest Portable 1 View  Result Date: 05/09/2022 CLINICAL DATA:  Chest pain and shortness of breath EXAM: PORTABLE CHEST 1 VIEW COMPARISON:  07/15/2020 FINDINGS: Cardiac shadow is stable. Diffuse fibrotic changes are identified throughout both lungs. No focal infiltrate or sizable effusion is seen. No bony abnormality is noted. IMPRESSION: Changes consistent with pulmonary fibrosis without acute abnormality. Electronically Signed   By: Inez Catalina M.D.   On: 05/09/2022 22:03    Barton Dubois, MD  [facility]  If 7PM-7AM, please contact night-coverage [email or web address] Password [facility] 05/16/2022, 6:34 PM   LOS: 6 days

## 2022-05-17 DIAGNOSIS — J9621 Acute and chronic respiratory failure with hypoxia: Secondary | ICD-10-CM | POA: Diagnosis not present

## 2022-05-17 DIAGNOSIS — K5900 Constipation, unspecified: Secondary | ICD-10-CM | POA: Diagnosis not present

## 2022-05-17 DIAGNOSIS — F411 Generalized anxiety disorder: Secondary | ICD-10-CM | POA: Diagnosis not present

## 2022-05-17 DIAGNOSIS — Z7189 Other specified counseling: Secondary | ICD-10-CM | POA: Diagnosis not present

## 2022-05-17 DIAGNOSIS — E039 Hypothyroidism, unspecified: Secondary | ICD-10-CM | POA: Diagnosis not present

## 2022-05-17 DIAGNOSIS — Z515 Encounter for palliative care: Secondary | ICD-10-CM | POA: Diagnosis not present

## 2022-05-17 DIAGNOSIS — J84112 Idiopathic pulmonary fibrosis: Secondary | ICD-10-CM | POA: Diagnosis not present

## 2022-05-17 LAB — GLUCOSE, CAPILLARY
Glucose-Capillary: 108 mg/dL — ABNORMAL HIGH (ref 70–99)
Glucose-Capillary: 159 mg/dL — ABNORMAL HIGH (ref 70–99)
Glucose-Capillary: 162 mg/dL — ABNORMAL HIGH (ref 70–99)
Glucose-Capillary: 167 mg/dL — ABNORMAL HIGH (ref 70–99)

## 2022-05-17 NOTE — Progress Notes (Addendum)
PROGRESS NOTE  [name] Fitzpatrick [id number] DOB: [birth year] DOA: 05/09/2022 PCP: Joya Gaskins, FNP  Brief History:  66 year old female with a history of chronic respiratory failure on 6 L, pulmonary fibrosis, diabetes mellitus type 2, anemia of chronic disease, anxiety, hypertension presenting with worsening cough and shortness of breath over the last 2 to 3 days.  The patient states that she has been having some headaches which began on 05/04/2022 followed by some emesis.  She states that her emesis has improved, unfortunately, her shortness of breath and coughing have worsened.  She has had some fevers at home up to 101.2 F.  She denies any hemoptysis, abdominal pain, diarrhea, hematochezia, melena.  She denies any focal extremity weakness or visual loss. She denies any worsening lower extremity edema. In the ED, the patient was afebrile with soft blood pressures in the 90s.  WBC 11.8, hemoglobin 10.2, platelets 222,000. BMP showed sodium 134, potassium 4.1, bicarbonate 26, BUN 20, creatinine 0.74.  LFTs unremarkable.  BNP 274.  CTA chest was negative for PE, negative dissection.  There is diffuse fibrotic changes with honeycombing.  There is a right upper lobe 4 mm nodule.  CTA abdomen pelvis was negative for any acute findings.  EKG shows sinus rhythm with incomplete right bundle branch block.  Troponin 11>>   Assessment/Plan: Acute on chronic respiratory failure with hypoxia -Secondary to exacerbation of pulmonary fibrosis/COPD. -Suspect underlying viral infection -Chronically on 6 L nasal cannula at home -On 15 L high flow>>wean for saturation 88-95%>>11-12L -05/09/2022 CTA chest negative for PE, consolidation -Pulmonary consult appreciated -Continue IV Solu-Medrol -continue Pulmicort, Maretta Bees and Brovana -COVID-19 PCR negative, RSV negative, influenza negative -check PCT <0.10 -viral resp panel--neg 2/27 AM--had episode respiratory distress with CP --repeat  CXR --continue morphine IV 1 mg for episodes of distress -- Continue as needed Hycodan --2/25 Echo--EF 60-65%, no WMA, normal RVF, trivial MR --check troponins, bmp, cbc. -Patient-overall condition guarded and poor.  Per her reports outpatient pulmonologist and cardiologist has expressed reaching terminal illness and she used to be on home hospice.   COPD exacerbation -continue IV solumedrol -continue Yupelri, brovana, pulmicort -Continue to wean off oxygen supplementation as tolerated.   Anxiety disorder -Continue home dose alprazolam, Zoloft -continue ativan prn   Opioid dependence -PDMP reviewed--patient receives morphine ER 15 mg, 90 monthly -Patient receives Dilaudid 4 mg, #120 monthly -Patient has been continue on extended release morphine every 12 hours, as needed Dilaudid and also IV morphine for breakthrough.   Hypothyroidism -Continue Synthroid   Diabetes mellitus type 2 -Appears to be managed by lifestyle modification -NovoLog sliding scale -Checked A1c--5.3 -CBGs controlled for the most part.  Continue to follow trend.   Mixed hyperlipidemia -continue statin.   Junction -Patient has expressed wanting full scope treatment practice and resuscitation if required -Appreciate assistance and recommendation by palliative care.   Subjective: No fever, no nausea or vomiting; continue experiencing intermittent mildly productive coughing spells, pleuritic chest discomfort and short winded sensation with minimal activity.  Overnight patient required to be placed on BiPAP.  Requiring 11-12 L high flow nasal cannula supplementation.  Objective: Vitals:   05/17/22 0500 05/17/22 0739 05/17/22 1115 05/17/22 1600  BP:      Pulse:      Resp:      Temp:  98.2 F (36.8 C) 99 F (37.2 C) 98.3 F (36.8 C)  TempSrc:  Oral Oral Oral  SpO2:  96%    Weight:  59.9 kg     Height:        Intake/Output Summary (Last 24 hours) at 05/17/2022 1711 Last data filed at 05/17/2022 1231 Gross  per 24 hour  Intake 240 ml  Output 1000 ml  Net -760 ml   Weight change: -1.5 kg  Exam: General exam: Alert, awake, oriented x 3, with minimal activity and requiring 11-12 L high flow nasal cannula supplementation. Respiratory system: Positive for rhonchi bilaterally; diffuse expiratory wheezing appreciated.  Positive tachypnea; no using accessory muscles. Cardiovascular system: Sinus tachycardia, no rubs, no gallops, no JVD. Gastrointestinal system: Abdomen is nondistended, soft and nontender. No organomegaly or masses felt. Normal bowel sounds heard. Central nervous system: Alert and oriented. No focal neurological deficits. Extremities: No cyanosis. Skin: No petechiae. Psychiatry: Judgement and insight appear normal.  Flat affect appreciated.  Data Reviewed: I have personally reviewed following labs and imaging studies  Basic Metabolic Panel: Recent Labs  Lab 05/11/22 0502 05/12/22 0219 05/13/22 1245 05/15/22 0851  NA 136 137 134* 133*  K 4.1 4.1 3.9 4.3  CL 103 101 97* 93*  CO2 '28 28 29 '$ 34*  GLUCOSE 157* 124* 160* 108*  BUN '14 15 16 14  '$ CREATININE 0.53 0.62 0.55 0.50  CALCIUM 8.9 8.8* 8.6* 8.6*  MG 1.8 1.8 1.7 1.8  PHOS  --  3.3 3.0 3.8   Liver Function Tests: Recent Labs  Lab 05/13/22 1245  AST 16  ALT 15  ALKPHOS 46  BILITOT 0.4  PROT 5.7*  ALBUMIN 2.9*   CBC: Recent Labs  Lab 05/12/22 0422 05/13/22 1245 05/15/22 0851  WBC 10.0 10.5 10.4  HGB 9.6* 9.9* 10.6*  HCT 31.4* 31.6* 33.9*  MCV 93.2 90.0 90.2  PLT 196 207 227   CBG: Recent Labs  Lab 05/16/22 1613 05/16/22 2005 05/17/22 0749 05/17/22 1116 05/17/22 1612  GLUCAP 116* 169* 108* 159* 162*   Urine analysis:    Component Value Date/Time   COLORURINE YELLOW 05/10/2022 1550   APPEARANCEUR HAZY (A) 05/10/2022 1550   LABSPEC >1.046 (H) 05/10/2022 1550   PHURINE 5.0 05/10/2022 1550   GLUCOSEU NEGATIVE 05/10/2022 1550   HGBUR NEGATIVE 05/10/2022 1550   HGBUR negative 05/17/2006 0000    BILIRUBINUR NEGATIVE 05/10/2022 1550   BILIRUBINUR small 03/30/2015 1546   KETONESUR 5 (A) 05/10/2022 1550   PROTEINUR 30 (A) 05/10/2022 1550   UROBILINOGEN 0.2 03/30/2015 1546   UROBILINOGEN 0.2 11/08/2013 0237   NITRITE NEGATIVE 05/10/2022 1550   LEUKOCYTESUR NEGATIVE 05/10/2022 1550   Sepsis Labs:  Recent Results (from the past 240 hour(s))  Resp panel by RT-PCR (RSV, Flu A&B, Covid) Anterior Nasal Swab     Status: None   Collection Time: 05/09/22  9:55 PM   Specimen: Anterior Nasal Swab  Result Value Ref Range Status   SARS Coronavirus 2 by RT PCR NEGATIVE NEGATIVE Final    Comment: (NOTE) SARS-CoV-2 target nucleic acids are NOT DETECTED.  The SARS-CoV-2 RNA is generally detectable in upper respiratory specimens during the acute phase of infection. The lowest concentration of SARS-CoV-2 viral copies this assay can detect is 138 copies/mL. A negative result does not preclude SARS-Cov-2 infection and should not be used as the sole basis for treatment or other patient management decisions. A negative result may occur with  improper specimen collection/handling, submission of specimen other than nasopharyngeal swab, presence of viral mutation(s) within the areas targeted by this assay, and inadequate number of viral copies(<138 copies/mL). A negative result must be combined with clinical observations,  patient history, and epidemiological information. The expected result is Negative.  Fact Sheet for Patients:  EntrepreneurPulse.com.au  Fact Sheet for Healthcare Providers:  IncredibleEmployment.be  This test is no t yet approved or cleared by the [place] FDA and  has been authorized for detection and/or diagnosis of SARS-CoV-2 by FDA under an Emergency Use Authorization (EUA). This EUA will remain  in effect (meaning this test can be used) for the duration of the COVID-19 declaration under Section 564(b)(1) of the Act,  21 U.S.C.section 360bbb-3(b)(1), unless the authorization is terminated  or revoked sooner.       Influenza A by PCR NEGATIVE NEGATIVE Final   Influenza B by PCR NEGATIVE NEGATIVE Final    Comment: (NOTE) The Xpert Xpress SARS-CoV-2/FLU/RSV plus assay is intended as an aid in the diagnosis of influenza from Nasopharyngeal swab specimens and should not be used as a sole basis for treatment. Nasal washings and aspirates are unacceptable for Xpert Xpress SARS-CoV-2/FLU/RSV testing.  Fact Sheet for Patients: EntrepreneurPulse.com.au  Fact Sheet for Healthcare Providers: IncredibleEmployment.be  This test is not yet approved or cleared by the [place] FDA and has been authorized for detection and/or diagnosis of SARS-CoV-2 by FDA under an Emergency Use Authorization (EUA). This EUA will remain in effect (meaning this test can be used) for the duration of the COVID-19 declaration under Section 564(b)(1) of the Act, 21 U.S.C. section 360bbb-3(b)(1), unless the authorization is terminated or revoked.     Resp Syncytial Virus by PCR NEGATIVE NEGATIVE Final    Comment: (NOTE) Fact Sheet for Patients: EntrepreneurPulse.com.au  Fact Sheet for Healthcare Providers: IncredibleEmployment.be  This test is not yet approved or cleared by the [place] FDA and has been authorized for detection and/or diagnosis of SARS-CoV-2 by FDA under an Emergency Use Authorization (EUA). This EUA will remain in effect (meaning this test can be used) for the duration of the COVID-19 declaration under Section 564(b)(1) of the Act, 21 U.S.C. section 360bbb-3(b)(1), unless the authorization is terminated or revoked.  Performed at [facility], [address], [place], [place] [address]   MRSA Next Gen by PCR, Nasal     Status: None   Collection Time: 05/10/22  4:15 AM   Specimen: Nasal Mucosa; Nasal Swab  Result Value Ref  Range Status   MRSA by PCR Next Gen NOT DETECTED NOT DETECTED Final    Comment: (NOTE) The GeneXpert MRSA Assay (FDA approved for NASAL specimens only), is one component of a comprehensive MRSA colonization surveillance program. It is not intended to diagnose MRSA infection nor to guide or monitor treatment for MRSA infections. Test performance is not FDA approved in patients less than 67 years old. Performed at [facility], [address] [place], [place] [address]   Culture, blood (Routine X 2) w Reflex to ID Panel     Status: None   Collection Time: 05/10/22  8:39 AM   Specimen: Right Antecubital; Blood  Result Value Ref Range Status   Specimen Description RIGHT ANTECUBITAL  Final   Special Requests   Final    BOTTLES DRAWN AEROBIC AND ANAEROBIC Blood Culture adequate volume   Culture   Final    NO GROWTH 5 DAYS Performed at [facility], [address], [place], [place] [address]    Report Status 05/15/2022 FINAL  Final  Culture, blood (Routine X 2) w Reflex to ID Panel     Status: None   Collection Time: 05/10/22  8:39 AM   Specimen: BLOOD RIGHT ARM  Result Value Ref Range Status   Specimen Description BLOOD RIGHT ARM  Final   Special Requests   Final    BOTTLES DRAWN AEROBIC AND ANAEROBIC Blood Culture results may not be optimal due to an excessive volume of blood received in culture bottles   Culture   Final    NO GROWTH 5 DAYS Performed at [facility], [address], [place], [place] [address]    Report Status 05/15/2022 FINAL  Final  Respiratory (~20 pathogens) panel by PCR     Status: None   Collection Time: 05/10/22  9:30 AM   Specimen: Nasopharyngeal Swab; Respiratory  Result Value Ref Range Status   Adenovirus NOT DETECTED NOT DETECTED Final   Coronavirus 229E NOT DETECTED NOT DETECTED Final    Comment: (NOTE) The Coronavirus on the Respiratory Panel, DOES NOT test for the novel  Coronavirus (2019 nCoV)    Coronavirus HKU1 NOT DETECTED NOT DETECTED Final    Coronavirus NL63 NOT DETECTED NOT DETECTED Final   Coronavirus OC43 NOT DETECTED NOT DETECTED Final   Metapneumovirus NOT DETECTED NOT DETECTED Final   Rhinovirus / Enterovirus NOT DETECTED NOT DETECTED Final   Influenza A NOT DETECTED NOT DETECTED Final   Influenza B NOT DETECTED NOT DETECTED Final   Parainfluenza Virus 1 NOT DETECTED NOT DETECTED Final   Parainfluenza Virus 2 NOT DETECTED NOT DETECTED Final   Parainfluenza Virus 3 NOT DETECTED NOT DETECTED Final   Parainfluenza Virus 4 NOT DETECTED NOT DETECTED Final   Respiratory Syncytial Virus NOT DETECTED NOT DETECTED Final   Bordetella pertussis NOT DETECTED NOT DETECTED Final   Bordetella Parapertussis NOT DETECTED NOT DETECTED Final   Chlamydophila pneumoniae NOT DETECTED NOT DETECTED Final   Mycoplasma pneumoniae NOT DETECTED NOT DETECTED Final    Comment: Performed at [facility], [place]. [address], [place], [place] [address]     Scheduled Meds:  ALPRAZolam  0.5 mg Oral TID   arformoterol  15 mcg Nebulization BID   aspirin EC  81 mg Oral Daily   budesonide (PULMICORT) nebulizer solution  0.5 mg Nebulization BID   Chlorhexidine Gluconate Cloth  6 each Topical Q0600   clotrimazole  10 mg Oral 5 X Daily   docusate sodium  200 mg Oral BID   fluticasone  2 spray Each Nare Daily   guaiFENesin  1,200 mg Oral BID   insulin aspart  0-6 Units Subcutaneous TID WC   levothyroxine  175 mcg Oral Q0600   methylPREDNISolone (SOLU-MEDROL) injection  80 mg Intravenous Q12H   morphine  15 mg Oral Q12H   mouth rinse  15 mL Mouth Rinse 4 times per day   pantoprazole  40 mg Oral BID   revefenacin  175 mcg Nebulization Daily   rosuvastatin  20 mg Oral Daily   senna-docusate  1 tablet Oral QHS   sertraline  100 mg Oral QHS   sodium chloride  2 spray Each Nare BID   Continuous Infusions:  Procedures/Studies: DG CHEST PORT 1 VIEW  Result Date: 05/15/2022 CLINICAL DATA:  Respiratory distress EXAM: PORTABLE CHEST 1 VIEW COMPARISON:   05/13/2022.  CTA chest of 05/09/2022 FINDINGS: Midline trachea. Normal heart size. No pleural effusion or pneumothorax. Underlying basilar and peripheral predominant accentuated pulmonary interstitium, consistent with pulmonary fibrosis. No convincing evidence of acute superimposed lobar consolidation. IMPRESSION: Severe interstitial lung disease. This limits sensitivity of plain films. No convincing evidence of acute superimposed process. Electronically Signed   By: Abigail Miyamoto M.D.   On:  05/15/2022 08:50   ECHOCARDIOGRAM COMPLETE  Result Date: 05/13/2022    ECHOCARDIOGRAM REPORT   Patient Name:   MINDE GASH Date of Exam: 05/13/2022 Medical Rec #:  [id number]           Height:       62.0 in Accession #:    [id number]          Weight:       135.1 lb Date of Birth:  [birth year]           BSA:          1.618 m Patient Age:    14 years            BP:           116/52 mmHg Patient Gender: F                   HR:           60 bpm. Exam Location:  [facility] Procedure: 2D Echo, Cardiac Doppler and Color Doppler Indications:    R07.9* Chest pain, unspecified; R06.02 SOB  History:        Patient has no prior history of Echocardiogram examinations.                 Abnormal ECG, COPD, Arrythmias:Tachycardia;                 Signs/Symptoms:Shortness of Breath, Dyspnea and Chest Pain.                 Pulmonary fibrosis.  Sonographer:    Roseanna Rainbow RDCS Referring Phys: [id number] DAVID TAT  Sonographer Comments: Technically difficult study due to poor echo windows. Chest wall deformity. IMPRESSIONS  1. Left ventricular ejection fraction, by estimation, is 60 to 65%. The left ventricle has normal function. The left ventricle has no regional wall motion abnormalities. There is mild asymmetric left ventricular hypertrophy of the basal-septal segment. Left ventricular diastolic parameters are indeterminate. Elevated left atrial pressure.  2. Right ventricular systolic function is normal. The right ventricular size is normal.  Tricuspid regurgitation signal is inadequate for assessing PA pressure.  3. Left atrial size was severely dilated.  4. The mitral valve is normal in structure. Trivial mitral valve regurgitation. No evidence of mitral stenosis.  5. The aortic valve is tricuspid. Aortic valve regurgitation is not visualized.  6. The inferior vena cava is dilated in size with >50% respiratory variability, suggesting right atrial pressure of 8 mmHg. Comparison(s): No prior Echocardiogram. FINDINGS  Left Ventricle: Left ventricular ejection fraction, by estimation, is 60 to 65%. The left ventricle has normal function. The left ventricle has no regional wall motion abnormalities. The left ventricular internal cavity size was small. There is mild asymmetric left ventricular hypertrophy of the basal-septal segment. Left ventricular diastolic parameters are indeterminate. Elevated left atrial pressure. Right Ventricle: The right ventricular size is normal. No increase in right ventricular wall thickness. Right ventricular systolic function is normal. Tricuspid regurgitation signal is inadequate for assessing PA pressure. Left Atrium: Left atrial size was severely dilated. Right Atrium: Right atrial size was normal in size. Pericardium: There is no evidence of pericardial effusion. Mitral Valve: The mitral valve is normal in structure. Trivial mitral valve regurgitation. No evidence of mitral valve stenosis. Tricuspid Valve: The tricuspid valve is normal in structure. Tricuspid valve regurgitation is not demonstrated. No evidence of tricuspid stenosis. Aortic Valve: The aortic valve is tricuspid. Aortic valve regurgitation is not visualized. Pulmonic Valve:  The pulmonic valve was not well visualized. Pulmonic valve regurgitation is not visualized. No evidence of pulmonic stenosis. Aorta: The aortic root and ascending aorta are structurally normal, with no evidence of dilitation. Venous: The inferior vena cava is dilated in size with greater  than 50% respiratory variability, suggesting right atrial pressure of 8 mmHg. IAS/Shunts: The atrial septum is grossly normal.  LEFT VENTRICLE PLAX 2D LVIDd:         3.10 cm     Diastology LVIDs:         2.10 cm     LV e' medial:    6.53 cm/s LV PW:         1.00 cm     LV E/e' medial:  12.1 LV IVS:        1.30 cm     LV e' lateral:   8.05 cm/s LVOT diam:     2.20 cm     LV E/e' lateral: 9.9 LV SV:         129 LV SV Index:   80 LVOT Area:     3.80 cm  LV Volumes (MOD) LV vol d, MOD A2C: 75.6 ml LV vol d, MOD A4C: 82.1 ml LV vol s, MOD A2C: 25.9 ml LV vol s, MOD A4C: 29.6 ml LV SV MOD A2C:     49.7 ml LV SV MOD A4C:     82.1 ml LV SV MOD BP:      50.7 ml RIGHT VENTRICLE             IVC RV S prime:     15.80 cm/s  IVC diam: 2.20 cm TAPSE (M-mode): 1.4 cm LEFT ATRIUM             Index        RIGHT ATRIUM           Index LA diam:        3.90 cm 2.41 cm/m   RA Area:     13.00 cm LA Vol (A2C):   97.3 ml 60.12 ml/m  RA Volume:   25.30 ml  15.63 ml/m LA Vol (A4C):   80.3 ml 49.62 ml/m LA Biplane Vol: 93.7 ml 57.90 ml/m  AORTIC VALVE LVOT Vmax:   147.00 cm/s LVOT Vmean:  98.800 cm/s LVOT VTI:    0.340 m  AORTA Ao Root diam: 3.50 cm Ao Asc diam:  3.20 cm MITRAL VALVE MV Area (PHT): 5.38 cm    SHUNTS MV Decel Time: 141 msec    Systemic VTI:  0.34 m MV E velocity: 79.30 cm/s  Systemic Diam: 2.20 cm MV A velocity: 56.90 cm/s MV E/A ratio:  1.39 Rudean Haskell MD Electronically signed by Rudean Haskell MD Signature Date/Time: 05/13/2022/2:18:19 PM    Final    DG CHEST PORT 1 VIEW  Result Date: 05/13/2022 CLINICAL DATA:  67 year old female presents with respiratory distress. EXAM: PORTABLE CHEST 1 VIEW COMPARISON:  May 09, 2022 FINDINGS: EKG leads project over the chest. Cardiomediastinal contours and hilar structures are normal. Diffuse interstitial and airspace opacities are similar with slightly diminished lung volumes. No pneumothorax. On limited assessment no acute skeletal findings. IMPRESSION:  Similar appearance of the chest accounting for slightly diminished lung volumes with findings of chronic interstitial lung disease. With this pattern it would be difficult to exclude superimposed infection or edema. Electronically Signed   By: Zetta Bills M.D.   On: 05/13/2022 12:32   CT Angio Chest/Abd/Pel for Dissection W and/or Wo Contrast  Result Date: 05/09/2022 CLINICAL DATA:  Chest pain radiating into the neck, initial encounter EXAM: CT ANGIOGRAPHY CHEST, ABDOMEN AND PELVIS TECHNIQUE: Non-contrast CT of the chest was initially obtained. Multidetector CT imaging through the chest, abdomen and pelvis was performed using the standard protocol during bolus administration of intravenous contrast. Multiplanar reconstructed images and MIPs were obtained and reviewed to evaluate the vascular anatomy. RADIATION DOSE REDUCTION: This exam was performed according to the departmental dose-optimization program which includes automated exposure control, adjustment of the mA and/or kV according to patient size and/or use of iterative reconstruction technique. CONTRAST:  170m OMNIPAQUE IOHEXOL 350 MG/ML SOLN COMPARISON:  07/15/2020 FINDINGS: CTA CHEST FINDINGS Cardiovascular: Initial precontrast images demonstrate atherosclerotic calcification of the thoracic aorta. No aneurysmal dilatation is seen. No hyperdense crescent to suggest acute aortic injury is noted. Postcontrast images were then obtained and reveal no evidence of aortic dissection. Heart is not significantly enlarged. The pulmonary artery shows a normal branching pattern bilaterally. No filling defect to suggest pulmonary embolism is noted. Coronary calcifications are noted. Mediastinum/Nodes: Thoracic inlet is within normal limits. Prominent AP window lymph node has resolved in the interval from the prior exam. Persistent right hilar lymph nodes are seen likely reactive in nature. The esophagus as visualized is within normal limits. Lungs/Pleura: Lungs  demonstrate diffuse fibrotic changes and honeycombing particularly in the bases. The previously seen diffuse superimposed infiltrates have resolved. There is a 4 mm nodule in the right upper lobe on image number 26 of series 9. No other sizable nodules are seen. No effusions are noted. Musculoskeletal: Degenerative changes of the thoracic spine are noted. No acute rib abnormality is seen. Chronic compression deformities of T5 and T7 are noted. Review of the MIP images confirms the above findings. CTA ABDOMEN AND PELVIS FINDINGS VASCULAR Aorta: Atherosclerotic calcifications are noted without aneurysmal dilatation or dissection. Celiac: Patent without evidence of aneurysm, dissection, vasculitis or significant stenosis. SMA: Patent without evidence of aneurysm, dissection, vasculitis or significant stenosis. Renals: Both renal arteries are patent without evidence of aneurysm, dissection, vasculitis, fibromuscular dysplasia or significant stenosis. IMA: Patent without evidence of aneurysm, dissection, vasculitis or significant stenosis. Inflow: Iliacs demonstrate mild atherosclerotic calcification although no focal stenosis is seen. Veins: Visualized venous structures appear within normal limits. IVC filter is noted in satisfactory position. Review of the MIP images confirms the above findings. NON-VASCULAR Hepatobiliary: Gallbladder has been surgically removed. Liver shows no focal abnormality. Pancreas: Unremarkable. No pancreatic ductal dilatation or surrounding inflammatory changes. Spleen: Normal in size without focal abnormality. Adrenals/Urinary Tract: Adrenal glands are within normal limits. Kidneys demonstrate a normal enhancement pattern bilaterally. No renal calculi or obstructive changes are seen. The bladder is partially distended. Stomach/Bowel: Fecal material is noted scattered throughout the colon. No obstructive or inflammatory changes are seen. The appendix is not well visualized and may have been  surgically. No inflammatory changes are seen. The small bowel and stomach are unremarkable. Lymphatic: No lymphadenopathy is noted. Reproductive: Status post hysterectomy. No adnexal masses. Other: No abdominal wall hernia or abnormality. No abdominopelvic ascites. Musculoskeletal: Postsurgical changes are noted in the lower lumbar spine. Prior vertebral augmentation at L1 is seen. Chronic L3 compression fracture is noted. Review of the MIP images confirms the above findings. IMPRESSION: CTA of the chest: No evidence of aortic injury. No pulmonary embolism is seen. Persistent right hilar lymph nodes likely reactive in nature. Chronic fibrotic changes are noted in the lungs bilaterally similar to prior exam. 4 mm right solid pulmonary nodule within the upper  lobe. Per Fleischner Society Guidelines, a non-contrast Chest CT at 12 months is optional. If performed and the nodule is stable at 12 months, no further follow-up is recommended. These guidelines do not apply to immunocompromised patients and patients with cancer. Follow up in patients with significant comorbidities as clinically warranted. For lung cancer screening, adhere to Lung-RADS guidelines. Reference: Radiology. 2017; 284(1):228-43. CTA of the abdomen and pelvis: No aortic abnormality is noted. No acute abnormality is seen. Electronically Signed   By: Inez Catalina M.D.   On: 05/09/2022 23:40   CT Head Wo Contrast  Result Date: 05/09/2022 CLINICAL DATA:  New onset headache EXAM: CT HEAD WITHOUT CONTRAST TECHNIQUE: Contiguous axial images were obtained from the base of the skull through the vertex without intravenous contrast. RADIATION DOSE REDUCTION: This exam was performed according to the departmental dose-optimization program which includes automated exposure control, adjustment of the mA and/or kV according to patient size and/or use of iterative reconstruction technique. COMPARISON:  None Available. FINDINGS: Brain: There is no mass, hemorrhage  or extra-axial collection. The size and configuration of the ventricles and extra-axial CSF spaces are normal. The brain parenchyma is normal, without acute or chronic infarction. Vascular: No abnormal hyperdensity of the major intracranial arteries or dural venous sinuses. No intracranial atherosclerosis. Skull: The visualized skull base, calvarium and extracranial soft tissues are normal. Sinuses/Orbits: No fluid levels or advanced mucosal thickening of the visualized paranasal sinuses. No mastoid or middle ear effusion. The orbits are normal. IMPRESSION: Normal head CT. Electronically Signed   By: Ulyses Jarred M.D.   On: 05/09/2022 23:22   DG Chest Portable 1 View  Result Date: 05/09/2022 CLINICAL DATA:  Chest pain and shortness of breath EXAM: PORTABLE CHEST 1 VIEW COMPARISON:  07/15/2020 FINDINGS: Cardiac shadow is stable. Diffuse fibrotic changes are identified throughout both lungs. No focal infiltrate or sizable effusion is seen. No bony abnormality is noted. IMPRESSION: Changes consistent with pulmonary fibrosis without acute abnormality. Electronically Signed   By: Inez Catalina M.D.   On: 05/09/2022 22:03    Barton Dubois, MD  [facility]  If 7PM-7AM, please contact night-coverage [email or web address] Password [email or web address] 05/17/2022, 5:11 PM   LOS: 7 days

## 2022-05-17 NOTE — Progress Notes (Signed)
Patient decided to try BIPAP tonight. Placed on dreamstation autoBIPAP with max 10 min 4 settings. 12 L bled into machine.

## 2022-05-17 NOTE — Plan of Care (Signed)

## 2022-05-17 NOTE — Progress Notes (Signed)
Palliative: [name] Fitzpatrick is resting quietly in bed.  She appears acutely/chronically ill and quite frail.  She is alert and oriented x 3, able to make her needs known.  There is no family at bedside at this time.  We talk about her bowel regimen.  She tells me that she had a bowel movement a few minutes ago.  She denies needs or issues.  Conference with bedside nursing staff.  Overall, [name] Fitzpatrick goals are set for full scope/full code.  Remains to be seen whether she would qualify for short-term rehab.   Plan: At this point continue full scope/full code.  Time for outcomes.  Need for short-term rehab would not be surprising. GOALS are set, PMT to shadow.   25 minutes Quinn Axe, NP Palliative medicine team Team phone [phone] Greater than 50% of this time was spent counseling and coordinating care related to the above assessment and plan.

## 2022-05-18 ENCOUNTER — Inpatient Hospital Stay (HOSPITAL_COMMUNITY): Payer: Medicare HMO

## 2022-05-18 DIAGNOSIS — E039 Hypothyroidism, unspecified: Secondary | ICD-10-CM | POA: Diagnosis not present

## 2022-05-18 DIAGNOSIS — F411 Generalized anxiety disorder: Secondary | ICD-10-CM | POA: Diagnosis not present

## 2022-05-18 DIAGNOSIS — J9621 Acute and chronic respiratory failure with hypoxia: Secondary | ICD-10-CM | POA: Diagnosis not present

## 2022-05-18 DIAGNOSIS — J84112 Idiopathic pulmonary fibrosis: Secondary | ICD-10-CM | POA: Diagnosis not present

## 2022-05-18 LAB — SEDIMENTATION RATE: Sed Rate: 4 mm/hr (ref 0–22)

## 2022-05-18 LAB — GLUCOSE, CAPILLARY
Glucose-Capillary: 93 mg/dL (ref 70–99)
Glucose-Capillary: 152 mg/dL — ABNORMAL HIGH (ref 70–99)
Glucose-Capillary: 169 mg/dL — ABNORMAL HIGH (ref 70–99)
Glucose-Capillary: 144 mg/dL — ABNORMAL HIGH (ref 70–99)

## 2022-05-18 MED ORDER — IPRATROPIUM BROMIDE 0.06 % NA SOLN
2.0000 | Freq: Four times a day (QID) | NASAL | Status: DC
  Administered 2022-05-19 – 2022-05-24 (×15): 2 via NASAL
  Filled 2022-05-18: qty 15

## 2022-05-18 MED ORDER — PANTOPRAZOLE SODIUM 40 MG PO TBEC
40.0000 mg | DELAYED_RELEASE_TABLET | Freq: Two times a day (BID) | ORAL | Status: DC
  Administered 2022-05-18 – 2022-05-24 (×12): 40 mg via ORAL
  Filled 2022-05-18 (×12): qty 1

## 2022-05-18 NOTE — Progress Notes (Signed)
NAME:  [name] Fitzpatrick, MRN:  [id number], DOB:  01-Nov-1955, LOS: 8 ADMISSION DATE:  05/09/2022, CONSULTATION DATE:  05/10/2022 REFERRING MD:  Dr. Carles Collet, [facility] COMPLAINT:  dypsnea   History of Present Illness:  67 yo female quit smoking age 75 with hx of ARDS and resulting pulmonary fibrosis, and COPD presented to [facility] ER with worsening dyspnea and cough.  Six days prior to admission she had fever 102F, sinus pressure and headache, and nose bleed.  SpO2 was 88% on her baseline oxygen of 6 liters.  She was started on Bipap.  PCCM consulted to assist with respiratory management.  She was hospitalized in 2020 for pneumonia with ARDS.  She required intubation and tracheostomy.  She developed pulmonary fibrosis after this.  She normally uses 8 liters oxygen at home.  Her activity level is severely limited due to her breathing.  She was told by her previous medical team in [place] that she should consider hospice care.  Pertinent  Medical History  DVT, ARDS, COPD, Depression, DM type 2, Fibromyalgia, GERD, Hashimoto's thyroiditis, HTN, IBS, Migraine headache, Seizure, Chronic pain, Atrial fibrillation  Significant Hospital Events: Including procedures, antibiotic start and stop dates in addition to other pertinent events   2/21 Admit  Scheduled Meds:  ALPRAZolam  0.5 mg Oral TID   arformoterol  15 mcg Nebulization BID   aspirin EC  81 mg Oral Daily   budesonide (PULMICORT) nebulizer solution  0.5 mg Nebulization BID   Chlorhexidine Gluconate Cloth  6 each Topical Q0600   clotrimazole  10 mg Oral 5 X Daily   docusate sodium  200 mg Oral BID   fluticasone  2 spray Each Nare Daily   guaiFENesin  1,200 mg Oral BID   insulin aspart  0-6 Units Subcutaneous TID WC   levothyroxine  175 mcg Oral Q0600   methylPREDNISolone (SOLU-MEDROL) injection  80 mg Intravenous Q12H   morphine  15 mg Oral Q12H   mouth rinse  15 mL Mouth Rinse 4 times per day   pantoprazole  40 mg Oral BID   revefenacin  175  mcg Nebulization Daily   rosuvastatin  20 mg Oral Daily   senna-docusate  1 tablet Oral QHS   sertraline  100 mg Oral QHS   sodium chloride  2 spray Each Nare BID   Continuous Infusions: PRN Meds:.acetaminophen **OR** acetaminophen, albuterol, HYDROcodone bit-homatropine, HYDROmorphone, LORazepam, melatonin, mineral oil, morphine injection, nitroGLYCERIN, ondansetron (ZOFRAN) IV, mouth rinse, phenol, polyethylene glycol, prochlorperazine, traZODone    Tests:  PFT 02/26/19 >> FEV1 1.15 (51%), FEV1% 81, DLCO 27% PFT 03/01/21 >> FEV1 1.28 (58%), FEV1% 81 Echo 07/20/21 >> EF 60 to 65%, mild LVH LHC 08/03/21 >> normal coronary angiogram CT angio chest 05/09/22 >> diffuse fibrotic changes and honeycombing more at bases, 4 mm nodule RUL CT sinus   Interim History / Subjective:  Still congested cough, sob x at rest    Objective   Blood pressure (!) 100/58, pulse 77, temperature 97.7 F (36.5 C), temperature source Oral, resp. rate 17, height '5\' 2"'$  (1.575 m), weight 59.9 kg, SpO2 99 %.        Intake/Output Summary (Last 24 hours) at 05/18/2022 1459 Last data filed at 05/18/2022 0948 Gross per 24 hour  Intake 480 ml  Output 4 ml  Net 476 ml   Filed Weights   05/16/22 0500 05/17/22 0500 05/18/22 0554  Weight: 61.4 kg 59.9 kg 59.9 kg    Examination: Tmax:  98.3 General appearance:  elderly wf/ min rattling cough   At Rest 02 sats  96% on 8 lpm but waveform poor   No jvd Oropharynx clear,  mucosa nl Neck supple Lungs with coarse insp sqeaks/ rattles , min exp rhonchi bilaterally  RRR no s3 or or sign murmur Abd soft / nl  excursion  Extr warm with no edema  - severe clubbing noted Neuro  Sensorium intact ,  no apparent motor deficits    I personally reviewed images and agree with radiology impression as follows:  CXR:   portable 05/15/22  Severe interstitial lung disease. This limits sensitivity of plain films. No convincing evidence of acute superimposed process  Assessment &  Plan:   Acute on chronic hypoxic/hypercapnic respiratory failure likely from acute viral respiratory infection in setting of severe pulmonary fibrosis and COPD exacerbation. - goal SpO2 88 to 92%no need for higher/ beward pulse ox may not be accurate  - continue yupelri, brovana, pulmicort - prn albuterol - continue solumedrol 80 mg bid  - mucinex, flutter valve - max gerd rx  - pt refuses bipap, still desired full code > consider transfer to [facility] for pulmonary consultation by one of our PF docs as I doubt she'll ever make it to office   Rhinitis/ ? Sinusitis  - check sinus CT ordered  3/1  - try atovent NS for watery component assuming patent ostia s mucus obst in which case would need more decongestand and d/c anticholinergics     Atrial fibrillation with PFO. - previously followed by cardiology at [facility]. Chronic pain. Hypothyroidism. DM type 2 poorly controlled with steroid induced hyperglycemia. Epistaxis. - per primary team  Goals of care.      Best Practice (right click and "Reselect all SmartList Selections" daily)   Diet/type: Regular consistency (see orders) DVT prophylaxis: SCD GI prophylaxis: PPI Lines: N/A Foley:  N/A Code Status:  full code Last date of multidisciplinary goals of care discussion '[x]'$   Labs       Latest Ref Rng & Units 05/15/2022    8:51 AM 05/13/2022   12:45 PM 05/12/2022    2:19 AM  CMP  Glucose 70 - 99 mg/dL 108  160  124   BUN 8 - 23 mg/dL '14  16  15   '$ Creatinine 0.44 - 1.00 mg/dL 0.50  0.55  0.62   Sodium 135 - 145 mmol/L 133  134  137   Potassium 3.5 - 5.1 mmol/L 4.3  3.9  4.1   Chloride 98 - 111 mmol/L 93  97  101   CO2 22 - 32 mmol/L 34  29  28   Calcium 8.9 - 10.3 mg/dL 8.6  8.6  8.8   Total Protein 6.5 - 8.1 g/dL  5.7    Total Bilirubin 0.3 - 1.2 mg/dL  0.4    Alkaline Phos 38 - 126 U/L  46    AST 15 - 41 U/L  16    ALT 0 - 44 U/L  15         Latest Ref Rng & Units 05/15/2022    8:51 AM 05/13/2022    12:45 PM 05/12/2022    4:22 AM  CBC  WBC 4.0 - 10.5 K/uL 10.4  10.5  10.0   Hemoglobin 12.0 - 15.0 g/dL 10.6  9.9  9.6   Hematocrit 36.0 - 46.0 % 33.9  31.6  31.4   Platelets 150 - 400 K/uL 227  207  196  ABG    Component Value Date/Time   PHART 7.38 05/13/2022 1215   PCO2ART 61 (H) 05/13/2022 1215   PO2ART 145 (H) 05/13/2022 1215   HCO3 36.1 (H) 05/13/2022 1215   TCO2 32 05/25/2020 1315   ACIDBASEDEF 0.1 01/30/2010 2033   O2SAT 99.9 05/13/2022 1215    CBG (last 3)  Recent Labs    05/17/22 2148 05/18/22 0711 05/18/22 1115  GLUCAP 167* 93 152*       Lab Results  Component Value Date   ESRSEDRATE 4 05/18/2022    Lab Results  Component Value Date   TSH 0.445 05/09/2022    Christinia Gully, MD [facility] [place] [facility] [phone]   After 7:00 pm call Elink  [phone]

## 2022-05-18 NOTE — Progress Notes (Signed)
PROGRESS NOTE  [name] Fitzpatrick [id number] DOB: 09-13-1955 DOA: 05/09/2022 PCP: Joya Gaskins, FNP  Brief History:  67 year old female with a history of chronic respiratory failure on 6 L, pulmonary fibrosis, diabetes mellitus type 2, anemia of chronic disease, anxiety, hypertension presenting with worsening cough and shortness of breath over the last 2 to 3 days.  The patient states that she has been having some headaches which began on 05/04/2022 followed by some emesis.  She states that her emesis has improved, unfortunately, her shortness of breath and coughing have worsened.  She has had some fevers at home up to 101.2 F.  She denies any hemoptysis, abdominal pain, diarrhea, hematochezia, melena.  She denies any focal extremity weakness or visual loss. She denies any worsening lower extremity edema. In the ED, the patient was afebrile with soft blood pressures in the 90s.  WBC 11.8, hemoglobin 10.2, platelets 222,000. BMP showed sodium 134, potassium 4.1, bicarbonate 26, BUN 20, creatinine 0.74.  LFTs unremarkable.  BNP 274.  CTA chest was negative for PE, negative dissection.  There is diffuse fibrotic changes with honeycombing.  There is a right upper lobe 4 mm nodule.  CTA abdomen pelvis was negative for any acute findings.  EKG shows sinus rhythm with incomplete right bundle branch block.  Troponin 11>>   Assessment/Plan: Acute on chronic respiratory failure with hypoxia -Secondary to exacerbation of pulmonary fibrosis/COPD. -Suspect underlying viral infection -Chronically on 6 L nasal cannula at home -On 15 L high flow>>wean for saturation 88-95%>>11-12L -05/09/2022 CTA chest negative for PE, consolidation -Pulmonary consult appreciated -Continue IV Solu-Medrol -continue Pulmicort, Maretta Bees and Brovana -COVID-19 PCR negative, RSV negative, influenza negative -check PCT <0.10 -viral resp panel--neg 2/27 AM--had episode respiratory distress with CP --repeat  CXR --continue morphine IV 1 mg for episodes of distress -- Continue as needed Hycodan --2/25 Echo--EF 60-65%, no WMA, normal RVF, trivial MR --check troponins, bmp, cbc. -Patient-overall condition guarded and poor.  Per her reports outpatient pulmonologist and cardiologist has expressed reaching terminal illness and she used to be on home hospice. -Pulmonology service has been consulted -ESR has been checked -Continue to follow clinical response. -Overall long-term prognosis is poor and guarded.   COPD exacerbation -continue IV solumedrol -continue Yupelri, brovana, pulmicort -Continue to wean off oxygen supplementation as tolerated.   Anxiety disorder -Continue home dose alprazolam, Zoloft -continue ativan prn   Opioid dependence -PDMP reviewed--patient receives morphine ER 15 mg, 90 monthly -Patient receives Dilaudid 4 mg, #120 monthly -Patient has been continue on extended release morphine every 12 hours, as needed Dilaudid and also IV morphine for breakthrough.   Hypothyroidism -Continue Synthroid   Diabetes mellitus type 2 -Appears to be managed by lifestyle modification -NovoLog sliding scale -Checked A1c--5.3 -CBGs controlled for the most part.  Continue to follow trend.   Mixed hyperlipidemia -continue statin.   Cotopaxi -Patient has expressed wanting full scope treatment practice and resuscitation if required -Appreciate assistance and recommendation by palliative care.   Subjective: No fever, no nausea, no vomiting and no major distress currently.  Continues to experience intermittent episodes of tachypnea and short winded sensation.  Requiring 9 L high flow nasal cannula supplementation.  Objective: Vitals:   05/18/22 0800 05/18/22 0900 05/18/22 1000 05/18/22 1100  BP: 105/67  110/65   Pulse: 77 72 89 76  Resp: '15 18 14 15  '$ Temp:      TempSrc:      SpO2: 99% 100% 92%  96%  Weight:      Height:        Intake/Output Summary (Last 24 hours) at 05/18/2022  1307 Last data filed at 05/18/2022 0948 Gross per 24 hour  Intake 480 ml  Output 4 ml  Net 476 ml   Weight change: 0 kg  Exam: General exam: Alert, awake, oriented x 3; no nausea, no vomiting; feeling slightly better.  Oxygen supplementation has improved toward on 9 L high flow nasal cannula.  No fever. Respiratory system: Positive rhonchi bilaterally, positive expiratory wheezing.  No using accessory muscle.  Intermittent tachypnea appreciated. Cardiovascular system:RRR.  No rubs, no gallops, no JVD. Gastrointestinal system: Abdomen is nondistended, soft and nontender. No organomegaly or masses felt. Normal bowel sounds heard. Central nervous system: Alert and oriented. No focal neurological deficits. Extremities: No cyanosis, positive severe clubbing. Skin: No petechiae. Psychiatry: Judgement and insight appear normal. Mood & affect appropriate.   Data Reviewed: I have personally reviewed following labs and imaging studies  Basic Metabolic Panel: Recent Labs  Lab 05/12/22 0219 05/13/22 1245 05/15/22 0851  NA 137 134* 133*  K 4.1 3.9 4.3  CL 101 97* 93*  CO2 28 29 34*  GLUCOSE 124* 160* 108*  BUN '15 16 14  '$ CREATININE 0.62 0.55 0.50  CALCIUM 8.8* 8.6* 8.6*  MG 1.8 1.7 1.8  PHOS 3.3 3.0 3.8   Liver Function Tests: Recent Labs  Lab 05/13/22 1245  AST 16  ALT 15  ALKPHOS 46  BILITOT 0.4  PROT 5.7*  ALBUMIN 2.9*   CBC: Recent Labs  Lab 05/12/22 0422 05/13/22 1245 05/15/22 0851  WBC 10.0 10.5 10.4  HGB 9.6* 9.9* 10.6*  HCT 31.4* 31.6* 33.9*  MCV 93.2 90.0 90.2  PLT 196 207 227   CBG: Recent Labs  Lab 05/17/22 1116 05/17/22 1612 05/17/22 2148 05/18/22 0711 05/18/22 1115  GLUCAP 159* 162* 167* 93 152*   Urine analysis:    Component Value Date/Time   COLORURINE YELLOW 05/10/2022 1550   APPEARANCEUR HAZY (A) 05/10/2022 1550   LABSPEC >1.046 (H) 05/10/2022 1550   PHURINE 5.0 05/10/2022 1550   GLUCOSEU NEGATIVE 05/10/2022 1550   HGBUR NEGATIVE  05/10/2022 1550   HGBUR negative 05/17/2006 0000   BILIRUBINUR NEGATIVE 05/10/2022 1550   BILIRUBINUR small 03/30/2015 1546   KETONESUR 5 (A) 05/10/2022 1550   PROTEINUR 30 (A) 05/10/2022 1550   UROBILINOGEN 0.2 03/30/2015 1546   UROBILINOGEN 0.2 11/08/2013 0237   NITRITE NEGATIVE 05/10/2022 1550   LEUKOCYTESUR NEGATIVE 05/10/2022 1550   Sepsis Labs:  Recent Results (from the past 240 hour(s))  Resp panel by RT-PCR (RSV, Flu A&B, Covid) Anterior Nasal Swab     Status: None   Collection Time: 05/09/22  9:55 PM   Specimen: Anterior Nasal Swab  Result Value Ref Range Status   SARS Coronavirus 2 by RT PCR NEGATIVE NEGATIVE Final    Comment: (NOTE) SARS-CoV-2 target nucleic acids are NOT DETECTED.  The SARS-CoV-2 RNA is generally detectable in upper respiratory specimens during the acute phase of infection. The lowest concentration of SARS-CoV-2 viral copies this assay can detect is 138 copies/mL. A negative result does not preclude SARS-Cov-2 infection and should not be used as the sole basis for treatment or other patient management decisions. A negative result may occur with  improper specimen collection/handling, submission of specimen other than nasopharyngeal swab, presence of viral mutation(s) within the areas targeted by this assay, and inadequate number of viral copies(<138 copies/mL). A negative result must be combined with  clinical observations, patient history, and epidemiological information. The expected result is Negative.  Fact Sheet for Patients:  EntrepreneurPulse.com.au  Fact Sheet for Healthcare Providers:  IncredibleEmployment.be  This test is no t yet approved or cleared by the [place] FDA and  has been authorized for detection and/or diagnosis of SARS-CoV-2 by FDA under an Emergency Use Authorization (EUA). This EUA will remain  in effect (meaning this test can be used) for the duration of the COVID-19 declaration  under Section 564(b)(1) of the Act, 21 U.S.C.section 360bbb-3(b)(1), unless the authorization is terminated  or revoked sooner.       Influenza A by PCR NEGATIVE NEGATIVE Final   Influenza B by PCR NEGATIVE NEGATIVE Final    Comment: (NOTE) The Xpert Xpress SARS-CoV-2/FLU/RSV plus assay is intended as an aid in the diagnosis of influenza from Nasopharyngeal swab specimens and should not be used as a sole basis for treatment. Nasal washings and aspirates are unacceptable for Xpert Xpress SARS-CoV-2/FLU/RSV testing.  Fact Sheet for Patients: EntrepreneurPulse.com.au  Fact Sheet for Healthcare Providers: IncredibleEmployment.be  This test is not yet approved or cleared by the [place] FDA and has been authorized for detection and/or diagnosis of SARS-CoV-2 by FDA under an Emergency Use Authorization (EUA). This EUA will remain in effect (meaning this test can be used) for the duration of the COVID-19 declaration under Section 564(b)(1) of the Act, 21 U.S.C. section 360bbb-3(b)(1), unless the authorization is terminated or revoked.     Resp Syncytial Virus by PCR NEGATIVE NEGATIVE Final    Comment: (NOTE) Fact Sheet for Patients: EntrepreneurPulse.com.au  Fact Sheet for Healthcare Providers: IncredibleEmployment.be  This test is not yet approved or cleared by the [place] FDA and has been authorized for detection and/or diagnosis of SARS-CoV-2 by FDA under an Emergency Use Authorization (EUA). This EUA will remain in effect (meaning this test can be used) for the duration of the COVID-19 declaration under Section 564(b)(1) of the Act, 21 U.S.C. section 360bbb-3(b)(1), unless the authorization is terminated or revoked.  Performed at [facility], [address] [place], [place] [address]   MRSA Next Gen by PCR, Nasal     Status: None   Collection Time: 05/10/22  4:15 AM   Specimen: Nasal  Mucosa; Nasal Swab  Result Value Ref Range Status   MRSA by PCR Next Gen NOT DETECTED NOT DETECTED Final    Comment: (NOTE) The GeneXpert MRSA Assay (FDA approved for NASAL specimens only), is one component of a comprehensive MRSA colonization surveillance program. It is not intended to diagnose MRSA infection nor to guide or monitor treatment for MRSA infections. Test performance is not FDA approved in patients less than 63 years old. Performed at [facility], [address] [place], [place] [address]   Culture, blood (Routine X 2) w Reflex to ID Panel     Status: None   Collection Time: 05/10/22  8:39 AM   Specimen: Right Antecubital; Blood  Result Value Ref Range Status   Specimen Description RIGHT ANTECUBITAL  Final   Special Requests   Final    BOTTLES DRAWN AEROBIC AND ANAEROBIC Blood Culture adequate volume   Culture   Final    NO GROWTH 5 DAYS Performed at [facility], [address], [place],  [address]    Report Status 05/15/2022 FINAL  Final  Culture, blood (Routine X 2) w Reflex to ID Panel     Status: None   Collection Time: 05/10/22  8:39 AM   Specimen: BLOOD  RIGHT ARM  Result Value Ref Range Status   Specimen Description BLOOD RIGHT ARM  Final   Special Requests   Final    BOTTLES DRAWN AEROBIC AND ANAEROBIC Blood Culture results may not be optimal due to an excessive volume of blood received in culture bottles   Culture   Final    NO GROWTH 5 DAYS Performed at [facility], [address], [place], [place] [address]    Report Status 05/15/2022 FINAL  Final  Respiratory (~20 pathogens) panel by PCR     Status: None   Collection Time: 05/10/22  9:30 AM   Specimen: Nasopharyngeal Swab; Respiratory  Result Value Ref Range Status   Adenovirus NOT DETECTED NOT DETECTED Final   Coronavirus 229E NOT DETECTED NOT DETECTED Final    Comment: (NOTE) The Coronavirus on the Respiratory Panel, DOES NOT test for the novel  Coronavirus (2019 nCoV)    Coronavirus  HKU1 NOT DETECTED NOT DETECTED Final   Coronavirus NL63 NOT DETECTED NOT DETECTED Final   Coronavirus OC43 NOT DETECTED NOT DETECTED Final   Metapneumovirus NOT DETECTED NOT DETECTED Final   Rhinovirus / Enterovirus NOT DETECTED NOT DETECTED Final   Influenza A NOT DETECTED NOT DETECTED Final   Influenza B NOT DETECTED NOT DETECTED Final   Parainfluenza Virus 1 NOT DETECTED NOT DETECTED Final   Parainfluenza Virus 2 NOT DETECTED NOT DETECTED Final   Parainfluenza Virus 3 NOT DETECTED NOT DETECTED Final   Parainfluenza Virus 4 NOT DETECTED NOT DETECTED Final   Respiratory Syncytial Virus NOT DETECTED NOT DETECTED Final   Bordetella pertussis NOT DETECTED NOT DETECTED Final   Bordetella Parapertussis NOT DETECTED NOT DETECTED Final   Chlamydophila pneumoniae NOT DETECTED NOT DETECTED Final   Mycoplasma pneumoniae NOT DETECTED NOT DETECTED Final    Comment: Performed at [facility], [place]. [address], [place], [place] [address]     Scheduled Meds:  ALPRAZolam  0.5 mg Oral TID   arformoterol  15 mcg Nebulization BID   aspirin EC  81 mg Oral Daily   budesonide (PULMICORT) nebulizer solution  0.5 mg Nebulization BID   Chlorhexidine Gluconate Cloth  6 each Topical Q0600   clotrimazole  10 mg Oral 5 X Daily   docusate sodium  200 mg Oral BID   fluticasone  2 spray Each Nare Daily   guaiFENesin  1,200 mg Oral BID   insulin aspart  0-6 Units Subcutaneous TID WC   levothyroxine  175 mcg Oral Q0600   methylPREDNISolone (SOLU-MEDROL) injection  80 mg Intravenous Q12H   morphine  15 mg Oral Q12H   mouth rinse  15 mL Mouth Rinse 4 times per day   pantoprazole  40 mg Oral BID   revefenacin  175 mcg Nebulization Daily   rosuvastatin  20 mg Oral Daily   senna-docusate  1 tablet Oral QHS   sertraline  100 mg Oral QHS   sodium chloride  2 spray Each Nare BID   Continuous Infusions:  Procedures/Studies: DG CHEST PORT 1 VIEW  Result Date: 05/15/2022 CLINICAL DATA:  Respiratory distress  EXAM: PORTABLE CHEST 1 VIEW COMPARISON:  05/13/2022.  CTA chest of 05/09/2022 FINDINGS: Midline trachea. Normal heart size. No pleural effusion or pneumothorax. Underlying basilar and peripheral predominant accentuated pulmonary interstitium, consistent with pulmonary fibrosis. No convincing evidence of acute superimposed lobar consolidation. IMPRESSION: Severe interstitial lung disease. This limits sensitivity of plain films. No convincing evidence of acute superimposed process. Electronically Signed   By: Adria Devon.D.  On: 05/15/2022 08:50   ECHOCARDIOGRAM COMPLETE  Result Date: 05/13/2022    ECHOCARDIOGRAM REPORT   Patient Name:   MONTANA NIANG Date of Exam: 05/13/2022 Medical Rec #:  [id number]           Height:       62.0 in Accession #:    [id number]          Weight:       135.1 lb Date of Birth:  Dec 06, 1955           BSA:          1.618 m Patient Age:    77 years            BP:           116/52 mmHg Patient Gender: F                   HR:           60 bpm. Exam Location:  [facility] Procedure: 2D Echo, Cardiac Doppler and Color Doppler Indications:    R07.9* Chest pain, unspecified; R06.02 SOB  History:        Patient has no prior history of Echocardiogram examinations.                 Abnormal ECG, COPD, Arrythmias:Tachycardia;                 Signs/Symptoms:Shortness of Breath, Dyspnea and Chest Pain.                 Pulmonary fibrosis.  Sonographer:    Roseanna Rainbow RDCS Referring Phys: [phone] DAVID TAT  Sonographer Comments: Technically difficult study due to poor echo windows. Chest wall deformity. IMPRESSIONS  1. Left ventricular ejection fraction, by estimation, is 60 to 65%. The left ventricle has normal function. The left ventricle has no regional wall motion abnormalities. There is mild asymmetric left ventricular hypertrophy of the basal-septal segment. Left ventricular diastolic parameters are indeterminate. Elevated left atrial pressure.  2. Right ventricular systolic function is normal.  The right ventricular size is normal. Tricuspid regurgitation signal is inadequate for assessing PA pressure.  3. Left atrial size was severely dilated.  4. The mitral valve is normal in structure. Trivial mitral valve regurgitation. No evidence of mitral stenosis.  5. The aortic valve is tricuspid. Aortic valve regurgitation is not visualized.  6. The inferior vena cava is dilated in size with >50% respiratory variability, suggesting right atrial pressure of 8 mmHg. Comparison(s): No prior Echocardiogram. FINDINGS  Left Ventricle: Left ventricular ejection fraction, by estimation, is 60 to 65%. The left ventricle has normal function. The left ventricle has no regional wall motion abnormalities. The left ventricular internal cavity size was small. There is mild asymmetric left ventricular hypertrophy of the basal-septal segment. Left ventricular diastolic parameters are indeterminate. Elevated left atrial pressure. Right Ventricle: The right ventricular size is normal. No increase in right ventricular wall thickness. Right ventricular systolic function is normal. Tricuspid regurgitation signal is inadequate for assessing PA pressure. Left Atrium: Left atrial size was severely dilated. Right Atrium: Right atrial size was normal in size. Pericardium: There is no evidence of pericardial effusion. Mitral Valve: The mitral valve is normal in structure. Trivial mitral valve regurgitation. No evidence of mitral valve stenosis. Tricuspid Valve: The tricuspid valve is normal in structure. Tricuspid valve regurgitation is not demonstrated. No evidence of tricuspid stenosis. Aortic Valve: The aortic valve is tricuspid. Aortic valve regurgitation is not visualized. Pulmonic  Valve: The pulmonic valve was not well visualized. Pulmonic valve regurgitation is not visualized. No evidence of pulmonic stenosis. Aorta: The aortic root and ascending aorta are structurally normal, with no evidence of dilitation. Venous: The inferior vena  cava is dilated in size with greater than 50% respiratory variability, suggesting right atrial pressure of 8 mmHg. IAS/Shunts: The atrial septum is grossly normal.  LEFT VENTRICLE PLAX 2D LVIDd:         3.10 cm     Diastology LVIDs:         2.10 cm     LV e' medial:    6.53 cm/s LV PW:         1.00 cm     LV E/e' medial:  12.1 LV IVS:        1.30 cm     LV e' lateral:   8.05 cm/s LVOT diam:     2.20 cm     LV E/e' lateral: 9.9 LV SV:         129 LV SV Index:   80 LVOT Area:     3.80 cm  LV Volumes (MOD) LV vol d, MOD A2C: 75.6 ml LV vol d, MOD A4C: 82.1 ml LV vol s, MOD A2C: 25.9 ml LV vol s, MOD A4C: 29.6 ml LV SV MOD A2C:     49.7 ml LV SV MOD A4C:     82.1 ml LV SV MOD BP:      50.7 ml RIGHT VENTRICLE             IVC RV S prime:     15.80 cm/s  IVC diam: 2.20 cm TAPSE (M-mode): 1.4 cm LEFT ATRIUM             Index        RIGHT ATRIUM           Index LA diam:        3.90 cm 2.41 cm/m   RA Area:     13.00 cm LA Vol (A2C):   97.3 ml 60.12 ml/m  RA Volume:   25.30 ml  15.63 ml/m LA Vol (A4C):   80.3 ml 49.62 ml/m LA Biplane Vol: 93.7 ml 57.90 ml/m  AORTIC VALVE LVOT Vmax:   147.00 cm/s LVOT Vmean:  98.800 cm/s LVOT VTI:    0.340 m  AORTA Ao Root diam: 3.50 cm Ao Asc diam:  3.20 cm MITRAL VALVE MV Area (PHT): 5.38 cm    SHUNTS MV Decel Time: 141 msec    Systemic VTI:  0.34 m MV E velocity: 79.30 cm/s  Systemic Diam: 2.20 cm MV A velocity: 56.90 cm/s MV E/A ratio:  1.39 Rudean Haskell MD Electronically signed by Rudean Haskell MD Signature Date/Time: 05/13/2022/2:18:19 PM    Final    DG CHEST PORT 1 VIEW  Result Date: 05/13/2022 CLINICAL DATA:  67 year old female presents with respiratory distress. EXAM: PORTABLE CHEST 1 VIEW COMPARISON:  May 09, 2022 FINDINGS: EKG leads project over the chest. Cardiomediastinal contours and hilar structures are normal. Diffuse interstitial and airspace opacities are similar with slightly diminished lung volumes. No pneumothorax. On limited assessment no  acute skeletal findings. IMPRESSION: Similar appearance of the chest accounting for slightly diminished lung volumes with findings of chronic interstitial lung disease. With this pattern it would be difficult to exclude superimposed infection or edema. Electronically Signed   By: Zetta Bills M.D.   On: 05/13/2022 12:32   CT Angio Chest/Abd/Pel for Dissection W and/or Wo Contrast  Result Date: 05/09/2022 CLINICAL DATA:  Chest pain radiating into the neck, initial encounter EXAM: CT ANGIOGRAPHY CHEST, ABDOMEN AND PELVIS TECHNIQUE: Non-contrast CT of the chest was initially obtained. Multidetector CT imaging through the chest, abdomen and pelvis was performed using the standard protocol during bolus administration of intravenous contrast. Multiplanar reconstructed images and MIPs were obtained and reviewed to evaluate the vascular anatomy. RADIATION DOSE REDUCTION: This exam was performed according to the departmental dose-optimization program which includes automated exposure control, adjustment of the mA and/or kV according to patient size and/or use of iterative reconstruction technique. CONTRAST:  139m OMNIPAQUE IOHEXOL 350 MG/ML SOLN COMPARISON:  07/15/2020 FINDINGS: CTA CHEST FINDINGS Cardiovascular: Initial precontrast images demonstrate atherosclerotic calcification of the thoracic aorta. No aneurysmal dilatation is seen. No hyperdense crescent to suggest acute aortic injury is noted. Postcontrast images were then obtained and reveal no evidence of aortic dissection. Heart is not significantly enlarged. The pulmonary artery shows a normal branching pattern bilaterally. No filling defect to suggest pulmonary embolism is noted. Coronary calcifications are noted. Mediastinum/Nodes: Thoracic inlet is within normal limits. Prominent AP window lymph node has resolved in the interval from the prior exam. Persistent right hilar lymph nodes are seen likely reactive in nature. The esophagus as visualized is  within normal limits. Lungs/Pleura: Lungs demonstrate diffuse fibrotic changes and honeycombing particularly in the bases. The previously seen diffuse superimposed infiltrates have resolved. There is a 4 mm nodule in the right upper lobe on image number 26 of series 9. No other sizable nodules are seen. No effusions are noted. Musculoskeletal: Degenerative changes of the thoracic spine are noted. No acute rib abnormality is seen. Chronic compression deformities of T5 and T7 are noted. Review of the MIP images confirms the above findings. CTA ABDOMEN AND PELVIS FINDINGS VASCULAR Aorta: Atherosclerotic calcifications are noted without aneurysmal dilatation or dissection. Celiac: Patent without evidence of aneurysm, dissection, vasculitis or significant stenosis. SMA: Patent without evidence of aneurysm, dissection, vasculitis or significant stenosis. Renals: Both renal arteries are patent without evidence of aneurysm, dissection, vasculitis, fibromuscular dysplasia or significant stenosis. IMA: Patent without evidence of aneurysm, dissection, vasculitis or significant stenosis. Inflow: Iliacs demonstrate mild atherosclerotic calcification although no focal stenosis is seen. Veins: Visualized venous structures appear within normal limits. IVC filter is noted in satisfactory position. Review of the MIP images confirms the above findings. NON-VASCULAR Hepatobiliary: Gallbladder has been surgically removed. Liver shows no focal abnormality. Pancreas: Unremarkable. No pancreatic ductal dilatation or surrounding inflammatory changes. Spleen: Normal in size without focal abnormality. Adrenals/Urinary Tract: Adrenal glands are within normal limits. Kidneys demonstrate a normal enhancement pattern bilaterally. No renal calculi or obstructive changes are seen. The bladder is partially distended. Stomach/Bowel: Fecal material is noted scattered throughout the colon. No obstructive or inflammatory changes are seen. The appendix  is not well visualized and may have been surgically. No inflammatory changes are seen. The small bowel and stomach are unremarkable. Lymphatic: No lymphadenopathy is noted. Reproductive: Status post hysterectomy. No adnexal masses. Other: No abdominal wall hernia or abnormality. No abdominopelvic ascites. Musculoskeletal: Postsurgical changes are noted in the lower lumbar spine. Prior vertebral augmentation at L1 is seen. Chronic L3 compression fracture is noted. Review of the MIP images confirms the above findings. IMPRESSION: CTA of the chest: No evidence of aortic injury. No pulmonary embolism is seen. Persistent right hilar lymph nodes likely reactive in nature. Chronic fibrotic changes are noted in the lungs bilaterally similar to prior exam. 4 mm right solid pulmonary nodule within the upper  lobe. Per Fleischner Society Guidelines, a non-contrast Chest CT at 12 months is optional. If performed and the nodule is stable at 12 months, no further follow-up is recommended. These guidelines do not apply to immunocompromised patients and patients with cancer. Follow up in patients with significant comorbidities as clinically warranted. For lung cancer screening, adhere to Lung-RADS guidelines. Reference: Radiology. 2017; 284(1):228-43. CTA of the abdomen and pelvis: No aortic abnormality is noted. No acute abnormality is seen. Electronically Signed   By: Inez Catalina M.D.   On: 05/09/2022 23:40   CT Head Wo Contrast  Result Date: 05/09/2022 CLINICAL DATA:  New onset headache EXAM: CT HEAD WITHOUT CONTRAST TECHNIQUE: Contiguous axial images were obtained from the base of the skull through the vertex without intravenous contrast. RADIATION DOSE REDUCTION: This exam was performed according to the departmental dose-optimization program which includes automated exposure control, adjustment of the mA and/or kV according to patient size and/or use of iterative reconstruction technique. COMPARISON:  None Available.  FINDINGS: Brain: There is no mass, hemorrhage or extra-axial collection. The size and configuration of the ventricles and extra-axial CSF spaces are normal. The brain parenchyma is normal, without acute or chronic infarction. Vascular: No abnormal hyperdensity of the major intracranial arteries or dural venous sinuses. No intracranial atherosclerosis. Skull: The visualized skull base, calvarium and extracranial soft tissues are normal. Sinuses/Orbits: No fluid levels or advanced mucosal thickening of the visualized paranasal sinuses. No mastoid or middle ear effusion. The orbits are normal. IMPRESSION: Normal head CT. Electronically Signed   By: Ulyses Jarred M.D.   On: 05/09/2022 23:22   DG Chest Portable 1 View  Result Date: 05/09/2022 CLINICAL DATA:  Chest pain and shortness of breath EXAM: PORTABLE CHEST 1 VIEW COMPARISON:  07/15/2020 FINDINGS: Cardiac shadow is stable. Diffuse fibrotic changes are identified throughout both lungs. No focal infiltrate or sizable effusion is seen. No bony abnormality is noted. IMPRESSION: Changes consistent with pulmonary fibrosis without acute abnormality. Electronically Signed   By: Inez Catalina M.D.   On: 05/09/2022 22:03    Barton Dubois, MD  [facility]  If 7PM-7AM, please contact night-coverage [email or web address] Password [email or web address] 05/18/2022, 1:07 PM   LOS: 8 days

## 2022-05-18 NOTE — Care Management Important Message (Signed)
Important Message  Patient Details  Name: [name] Fitzpatrick MRN: [id number] Date of Birth: 01-22-1956   Medicare Important Message Given:  Yes     Tommy Medal 05/18/2022, 11:00 AM

## 2022-05-18 NOTE — Plan of Care (Signed)

## 2022-05-19 DIAGNOSIS — Z515 Encounter for palliative care: Secondary | ICD-10-CM | POA: Diagnosis not present

## 2022-05-19 DIAGNOSIS — J9621 Acute and chronic respiratory failure with hypoxia: Secondary | ICD-10-CM | POA: Diagnosis not present

## 2022-05-19 DIAGNOSIS — K5900 Constipation, unspecified: Secondary | ICD-10-CM | POA: Diagnosis not present

## 2022-05-19 DIAGNOSIS — Z7189 Other specified counseling: Secondary | ICD-10-CM | POA: Diagnosis not present

## 2022-05-19 LAB — GLUCOSE, CAPILLARY
Glucose-Capillary: 105 mg/dL — ABNORMAL HIGH (ref 70–99)
Glucose-Capillary: 167 mg/dL — ABNORMAL HIGH (ref 70–99)
Glucose-Capillary: 151 mg/dL — ABNORMAL HIGH (ref 70–99)
Glucose-Capillary: 88 mg/dL (ref 70–99)

## 2022-05-19 MED ORDER — DOXYCYCLINE HYCLATE 100 MG PO TABS
100.0000 mg | ORAL_TABLET | Freq: Two times a day (BID) | ORAL | Status: DC
  Administered 2022-05-19 – 2022-05-24 (×10): 100 mg via ORAL
  Filled 2022-05-19 (×10): qty 1

## 2022-05-19 MED ORDER — LORATADINE 10 MG PO TABS
10.0000 mg | ORAL_TABLET | Freq: Every day | ORAL | Status: DC
  Administered 2022-05-19 – 2022-05-24 (×6): 10 mg via ORAL
  Filled 2022-05-19 (×6): qty 1

## 2022-05-19 NOTE — Progress Notes (Signed)
Called regarding transfer to [facility].  Reviewed Dr. [name] note yesterday.  There's really nothing new to offer in the inpatient management of pulmonary fibrosis. Work toward euvolemia, treat any concomitant infections, maintain nutrition/muscle mass, trials of steroids, wean o2 as needed.  Referral to a transplant center as OP (can consider IP but doubt will accept given borderline candidacy).  Would save her the risk of transfer at this time, please reach out and one of our PF experts ([email or web address]) can do a tele-consult.  Erskine Emery MD PCCM

## 2022-05-19 NOTE — Progress Notes (Signed)
PROGRESS NOTE  [name] Fitzpatrick [id number] DOB: 24-May-1955 DOA: 05/09/2022 PCP: Joya Gaskins, FNP  Brief History:  67 year old female with a history of chronic respiratory failure on 6 L, pulmonary fibrosis, diabetes mellitus type 2, anemia of chronic disease, anxiety, hypertension presenting with worsening cough and shortness of breath over the last 2 to 3 days.  The patient states that she has been having some headaches which began on 05/04/2022 followed by some emesis.  She states that her emesis has improved, unfortunately, her shortness of breath and coughing have worsened.  She has had some fevers at home up to 101.2 F.  She denies any hemoptysis, abdominal pain, diarrhea, hematochezia, melena.  She denies any focal extremity weakness or visual loss. She denies any worsening lower extremity edema. In the ED, the patient was afebrile with soft blood pressures in the 90s.  WBC 11.8, hemoglobin 10.2, platelets 222,000. BMP showed sodium 134, potassium 4.1, bicarbonate 26, BUN 20, creatinine 0.74.  LFTs unremarkable.  BNP 274.  CTA chest was negative for PE, negative dissection.  There is diffuse fibrotic changes with honeycombing.  There is a right upper lobe 4 mm nodule.  CTA abdomen pelvis was negative for any acute findings.  EKG shows sinus rhythm with incomplete right bundle branch block.  Troponin 11>>   Assessment/Plan: Acute on chronic respiratory failure with hypoxia -Secondary to exacerbation of pulmonary fibrosis/COPD. -Suspect underlying viral infection -Chronically on 6 L nasal cannula at home -On 15 L high flow>>wean for saturation 88-95%>>11-12L -05/09/2022 CTA chest negative for PE, consolidation -Pulmonary consult appreciated -Continue IV Solu-Medrol -continue Pulmicort, Maretta Bees and Brovana -COVID-19 PCR negative, RSV negative, influenza negative -check PCT <0.10 -viral resp panel--neg 2/27 AM--had episode respiratory distress with CP --repeat  CXR --continue morphine IV 1 mg for episodes of distress -- Continue as needed Hycodan --2/25 Echo--EF 60-65%, no WMA, normal RVF, trivial MR --check troponins, bmp, cbc. -Patient-overall condition guarded and poor.  Per her reports outpatient pulmonologist and cardiologist has expressed reaching terminal illness and she used to be on home hospice. -Pulmonology service has been consulted -ESR has been checked and is only 2 currently. -Continue to follow clinical response. -Overall long-term prognosis is poor and guarded.   COPD exacerbation -continue IV solumedrol -continue Yupelri, brovana, pulmicort -Continue to wean off oxygen supplementation as tolerated.  Acute on chronic sinusitis -After discussing with pulmonology service will provide treatment of any underlying infection. -Loratadine has been added to assist with congestion -Doxycycline x 7 days will be provided; patient allergic to penicillin.   Anxiety disorder -Continue home dose alprazolam, Zoloft -continue ativan prn   Opioid dependence -PDMP reviewed--patient receives morphine ER 15 mg, 90 monthly -Patient receives Dilaudid 4 mg, #120 monthly -Patient has been continue on extended release morphine every 12 hours, as needed Dilaudid and also IV morphine for breakthrough.   Hypothyroidism -Continue Synthroid   Diabetes mellitus type 2 -Appears to be managed by lifestyle modification -NovoLog sliding scale -Checked A1c--5.3 -CBGs controlled for the most part.  Continue to follow trend.   Mixed hyperlipidemia -continue statin.   [place] -Patient has expressed wanting full scope treatment practice and resuscitation if required -Appreciate assistance and recommendation by palliative care.   Subjective: No fever, no nausea, no vomiting and no major distress.  For the last 48 hours has not required the use of BiPAP.  Continues to experience intermittent short winded sensation and tachypnea.  9 L high flow nasal  cannula supplementation in place.  Objective: Vitals:   05/19/22 0900 05/19/22 1000 05/19/22 1100 05/19/22 1109  BP: (!) 101/55 105/64 113/60   Pulse:      Resp: (!) '26 18 16   '$ Temp:    97.7 F (36.5 C)  TempSrc:    Oral  SpO2:      Weight:      Height:        Intake/Output Summary (Last 24 hours) at 05/19/2022 1553 Last data filed at 05/19/2022 1230 Gross per 24 hour  Intake 240 ml  Output 2 ml  Net 238 ml   Weight change: -0.8 kg  Exam: General exam: Alert, awake, oriented x 3; reports feeling about the same, expressing no significant worsening of her breathing.  Currently afebrile. Respiratory system: Positive expiratory wheezing; intermittent tachypnea and diffuse rhonchi bilaterally.  9 L high flow nasal cannula supplementation in place. Cardiovascular system: Rate controlled, no rubs, no gallops, no JVD on exam. Gastrointestinal system: Abdomen is nondistended, soft and nontender. No organomegaly or masses felt. Normal bowel sounds heard. Central nervous system: Alert and oriented. No focal neurological deficits. Extremities: No cyanosis, trace edema appreciated lower extremity bilaterally.  Positive clubbing. Skin: No petechiae. Psychiatry: Judgement and insight appear normal. Mood & affect appropriate.   Data Reviewed: I have personally reviewed following labs and imaging studies  Basic Metabolic Panel: Recent Labs  Lab 05/13/22 1245 05/15/22 0851  NA 134* 133*  K 3.9 4.3  CL 97* 93*  CO2 29 34*  GLUCOSE 160* 108*  BUN 16 14  CREATININE 0.55 0.50  CALCIUM 8.6* 8.6*  MG 1.7 1.8  PHOS 3.0 3.8   Liver Function Tests: Recent Labs  Lab 05/13/22 1245  AST 16  ALT 15  ALKPHOS 46  BILITOT 0.4  PROT 5.7*  ALBUMIN 2.9*   CBC: Recent Labs  Lab 05/13/22 1245 05/15/22 0851  WBC 10.5 10.4  HGB 9.9* 10.6*  HCT 31.6* 33.9*  MCV 90.0 90.2  PLT 207 227   CBG: Recent Labs  Lab 05/18/22 1115 05/18/22 1607 05/18/22 2135 05/19/22 0730 05/19/22 1106   GLUCAP 152* 169* 144* 105* 167*   Urine analysis:    Component Value Date/Time   COLORURINE YELLOW 05/10/2022 1550   APPEARANCEUR HAZY (A) 05/10/2022 1550   LABSPEC >1.046 (H) 05/10/2022 1550   PHURINE 5.0 05/10/2022 1550   GLUCOSEU NEGATIVE 05/10/2022 1550   HGBUR NEGATIVE 05/10/2022 1550   HGBUR negative 05/17/2006 0000   BILIRUBINUR NEGATIVE 05/10/2022 1550   BILIRUBINUR small 03/30/2015 1546   KETONESUR 5 (A) 05/10/2022 1550   PROTEINUR 30 (A) 05/10/2022 1550   UROBILINOGEN 0.2 03/30/2015 1546   UROBILINOGEN 0.2 11/08/2013 0237   NITRITE NEGATIVE 05/10/2022 1550   LEUKOCYTESUR NEGATIVE 05/10/2022 1550   Sepsis Labs:  Recent Results (from the past 240 hour(s))  Resp panel by RT-PCR (RSV, Flu A&B, Covid) Anterior Nasal Swab     Status: None   Collection Time: 05/09/22  9:55 PM   Specimen: Anterior Nasal Swab  Result Value Ref Range Status   SARS Coronavirus 2 by RT PCR NEGATIVE NEGATIVE Final    Comment: (NOTE) SARS-CoV-2 target nucleic acids are NOT DETECTED.  The SARS-CoV-2 RNA is generally detectable in upper respiratory specimens during the acute phase of infection. The lowest concentration of SARS-CoV-2 viral copies this assay can detect is 138 copies/mL. A negative result does not preclude SARS-Cov-2 infection and should not be used as the sole basis for treatment or other patient management decisions.  A negative result may occur with  improper specimen collection/handling, submission of specimen other than nasopharyngeal swab, presence of viral mutation(s) within the areas targeted by this assay, and inadequate number of viral copies(<138 copies/mL). A negative result must be combined with clinical observations, patient history, and epidemiological information. The expected result is Negative.  Fact Sheet for Patients:  EntrepreneurPulse.com.au  Fact Sheet for Healthcare Providers:  IncredibleEmployment.be  This test is  no t yet approved or cleared by the [place] FDA and  has been authorized for detection and/or diagnosis of SARS-CoV-2 by FDA under an Emergency Use Authorization (EUA). This EUA will remain  in effect (meaning this test can be used) for the duration of the COVID-19 declaration under Section 564(b)(1) of the Act, 21 U.S.C.section 360bbb-3(b)(1), unless the authorization is terminated  or revoked sooner.       Influenza A by PCR NEGATIVE NEGATIVE Final   Influenza B by PCR NEGATIVE NEGATIVE Final    Comment: (NOTE) The Xpert Xpress SARS-CoV-2/FLU/RSV plus assay is intended as an aid in the diagnosis of influenza from Nasopharyngeal swab specimens and should not be used as a sole basis for treatment. Nasal washings and aspirates are unacceptable for Xpert Xpress SARS-CoV-2/FLU/RSV testing.  Fact Sheet for Patients: EntrepreneurPulse.com.au  Fact Sheet for Healthcare Providers: IncredibleEmployment.be  This test is not yet approved or cleared by the [place] FDA and has been authorized for detection and/or diagnosis of SARS-CoV-2 by FDA under an Emergency Use Authorization (EUA). This EUA will remain in effect (meaning this test can be used) for the duration of the COVID-19 declaration under Section 564(b)(1) of the Act, 21 U.S.C. section 360bbb-3(b)(1), unless the authorization is terminated or revoked.     Resp Syncytial Virus by PCR NEGATIVE NEGATIVE Final    Comment: (NOTE) Fact Sheet for Patients: EntrepreneurPulse.com.au  Fact Sheet for Healthcare Providers: IncredibleEmployment.be  This test is not yet approved or cleared by the [place] FDA and has been authorized for detection and/or diagnosis of SARS-CoV-2 by FDA under an Emergency Use Authorization (EUA). This EUA will remain in effect (meaning this test can be used) for the duration of the COVID-19 declaration under Section  564(b)(1) of the Act, 21 U.S.C. section 360bbb-3(b)(1), unless the authorization is terminated or revoked.  Performed at [facility], [address] [place], [place] [address]   MRSA Next Gen by PCR, Nasal     Status: None   Collection Time: 05/10/22  4:15 AM   Specimen: Nasal Mucosa; Nasal Swab  Result Value Ref Range Status   MRSA by PCR Next Gen NOT DETECTED NOT DETECTED Final    Comment: (NOTE) The GeneXpert MRSA Assay (FDA approved for NASAL specimens only), is one component of a comprehensive MRSA colonization surveillance program. It is not intended to diagnose MRSA infection nor to guide or monitor treatment for MRSA infections. Test performance is not FDA approved in patients less than 52 years old. Performed at [facility], [address] [place], [place] [address]   Culture, blood (Routine X 2) w Reflex to ID Panel     Status: None   Collection Time: 05/10/22  8:39 AM   Specimen: Right Antecubital; Blood  Result Value Ref Range Status   Specimen Description RIGHT ANTECUBITAL  Final   Special Requests   Final    BOTTLES DRAWN AEROBIC AND ANAEROBIC Blood Culture adequate volume   Culture   Final    NO GROWTH 5 DAYS Performed at [facility], [place]  [address] [place], [place] [address]    Report Status 05/15/2022 FINAL  Final  Culture, blood (Routine X 2) w Reflex to ID Panel     Status: None   Collection Time: 05/10/22  8:39 AM   Specimen: BLOOD RIGHT ARM  Result Value Ref Range Status   Specimen Description BLOOD RIGHT ARM  Final   Special Requests   Final    BOTTLES DRAWN AEROBIC AND ANAEROBIC Blood Culture results may not be optimal due to an excessive volume of blood received in culture bottles   Culture   Final    NO GROWTH 5 DAYS Performed at [facility], [address], [place], [place] [address]    Report Status 05/15/2022 FINAL  Final  Respiratory (~20 pathogens) panel by PCR     Status: None   Collection Time: 05/10/22  9:30 AM   Specimen:  Nasopharyngeal Swab; Respiratory  Result Value Ref Range Status   Adenovirus NOT DETECTED NOT DETECTED Final   Coronavirus 229E NOT DETECTED NOT DETECTED Final    Comment: (NOTE) The Coronavirus on the Respiratory Panel, DOES NOT test for the novel  Coronavirus (2019 nCoV)    Coronavirus HKU1 NOT DETECTED NOT DETECTED Final   Coronavirus NL63 NOT DETECTED NOT DETECTED Final   Coronavirus OC43 NOT DETECTED NOT DETECTED Final   Metapneumovirus NOT DETECTED NOT DETECTED Final   Rhinovirus / Enterovirus NOT DETECTED NOT DETECTED Final   Influenza A NOT DETECTED NOT DETECTED Final   Influenza B NOT DETECTED NOT DETECTED Final   Parainfluenza Virus 1 NOT DETECTED NOT DETECTED Final   Parainfluenza Virus 2 NOT DETECTED NOT DETECTED Final   Parainfluenza Virus 3 NOT DETECTED NOT DETECTED Final   Parainfluenza Virus 4 NOT DETECTED NOT DETECTED Final   Respiratory Syncytial Virus NOT DETECTED NOT DETECTED Final   Bordetella pertussis NOT DETECTED NOT DETECTED Final   Bordetella Parapertussis NOT DETECTED NOT DETECTED Final   Chlamydophila pneumoniae NOT DETECTED NOT DETECTED Final   Mycoplasma pneumoniae NOT DETECTED NOT DETECTED Final    Comment: Performed at [facility], [place] [address], [place], [place] [address]     Scheduled Meds:  ALPRAZolam  0.5 mg Oral TID   arformoterol  15 mcg Nebulization BID   aspirin EC  81 mg Oral Daily   budesonide (PULMICORT) nebulizer solution  0.5 mg Nebulization BID   Chlorhexidine Gluconate Cloth  6 each Topical Q0600   clotrimazole  10 mg Oral 5 X Daily   docusate sodium  200 mg Oral BID   doxycycline  100 mg Oral Q12H   fluticasone  2 spray Each Nare Daily   guaiFENesin  1,200 mg Oral BID   insulin aspart  0-6 Units Subcutaneous TID WC   ipratropium  2 spray Each Nare QID   levothyroxine  175 mcg Oral Q0600   methylPREDNISolone (SOLU-MEDROL) injection  80 mg Intravenous Q12H   morphine  15 mg Oral Q12H   mouth rinse  15 mL Mouth Rinse 4  times per day   pantoprazole  40 mg Oral BID AC   revefenacin  175 mcg Nebulization Daily   rosuvastatin  20 mg Oral Daily   senna-docusate  1 tablet Oral QHS   sertraline  100 mg Oral QHS   sodium chloride  2 spray Each Nare BID   Continuous Infusions:  Procedures/Studies: CT MAXILLOFACIAL  LTD WO CM  Result Date: 05/19/2022 CLINICAL DATA:  Initial evaluation for cough. EXAM: CT PARANASAL SINUS LIMITED WITHOUT CONTRAST TECHNIQUE:  Multidetector CT images of the paranasal sinuses were obtained using the standard protocol without intravenous contrast. RADIATION DOSE REDUCTION: This exam was performed according to the departmental dose-optimization program which includes automated exposure control, adjustment of the mA and/or kV according to patient size and/or use of iterative reconstruction technique. COMPARISON:  Prior head CT from 05/09/2022. FINDINGS: Paranasal sinuses: Frontal: Normally aerated. Patent frontal sinus drainage pathways. Ethmoid: Probable changes of prior partial ethmoidectomy. Mild-to-moderate mucoperiosteal thickening about the residual ethmoidal air cells, chronic in appearance. Maxillary: Mild chronic mucoperiosteal thickening present about the maxillary sinuses bilaterally, slightly worse on the right. Sphenoid: Trace layering fluid with pneumatized secretions present within the sphenoid sinuses. Left sphenoid sinus dominant. Right ostiomeatal unit: Patent with sequelae of prior antrostomy with uncinectomy. Left ostiomeatal unit: Patent with sequelae of prior antrostomy with uncinectomy. Nasal passages: Postoperative changes from prior bilateral turbinectomy. Nasal passages are clear. No more than trace 2 mm left-to-right nasal septal deviation. Anatomy: No pneumatization superior to anterior ethmoid notches. Symmetric and intact olfactory grooves and fovea ethmoidalis, Keros II (4-55m). Presellar sphenoid pneumatization pattern. No dehiscence of carotid or optic canals. No onodi  cell. Other: Mastoid air cells and middle ear cavities are well pneumatized and free of fluid. Visualized intracranial contents demonstrate no significant finding. Scattered vascular calcifications noted within the carotid siphons. Prior bilateral ocular lens replacement. Globes and orbital soft tissues demonstrate no acute finding. Remainder of the visualized soft tissues of the face are within normal limits. IMPRESSION: Sequelae of prior endoscopic sinus surgery with mild to moderate chronic mucoperiosteal thickening about the residual ethmoidal air cells and maxillary sinuses. Trace layering fluid with pneumatized secretions within the sphenoid sinuses, suggesting acute on chronic sinus disease. Electronically Signed   By: BJeannine BogaM.D.   On: 05/19/2022 02:50   DG CHEST PORT 1 VIEW  Result Date: 05/15/2022 CLINICAL DATA:  Respiratory distress EXAM: PORTABLE CHEST 1 VIEW COMPARISON:  05/13/2022.  CTA chest of 05/09/2022 FINDINGS: Midline trachea. Normal heart size. No pleural effusion or pneumothorax. Underlying basilar and peripheral predominant accentuated pulmonary interstitium, consistent with pulmonary fibrosis. No convincing evidence of acute superimposed lobar consolidation. IMPRESSION: Severe interstitial lung disease. This limits sensitivity of plain films. No convincing evidence of acute superimposed process. Electronically Signed   By: KAbigail MiyamotoM.D.   On: 05/15/2022 08:50   ECHOCARDIOGRAM COMPLETE  Result Date: 05/13/2022    ECHOCARDIOGRAM REPORT   Patient Name:   SJANIRAH REAPEDate of Exam: 05/13/2022 Medical Rec #:  [id number]          Height:       62.0 in Accession #:    [id number]         Weight:       135.1 lb Date of Birth:  [birth year]          BSA:          1.618 m Patient Age:    89years            BP:           116/52 mmHg Patient Gender: F                   HR:           60 bpm. [facility]  [facility]: 2D Echo, Cardiac Doppler and Color Doppler  Indications:    R07.9* Chest pain, unspecified; R06.02 SOB  History:        Patient has  no prior history of Echocardiogram examinations.                 Abnormal ECG, COPD, Arrythmias:Tachycardia;                 Signs/Symptoms:Shortness of Breath, Dyspnea and Chest Pain.                 Pulmonary fibrosis.  Sonographer:    Roseanna Rainbow RDCS Referring Phys: [phone] DAVID TAT  Sonographer Comments: Technically difficult study due to poor echo windows. Chest wall deformity. IMPRESSIONS  1. Left ventricular ejection fraction, by estimation, is 60 to 65%. The left ventricle has normal function. The left ventricle has no regional wall motion abnormalities. There is mild asymmetric left ventricular hypertrophy of the basal-septal segment. Left ventricular diastolic parameters are indeterminate. Elevated left atrial pressure.  2. Right ventricular systolic function is normal. The right ventricular size is normal. Tricuspid regurgitation signal is inadequate for assessing PA pressure.  3. Left atrial size was severely dilated.  4. The mitral valve is normal in structure. Trivial mitral valve regurgitation. No evidence of mitral stenosis.  5. The aortic valve is tricuspid. Aortic valve regurgitation is not visualized.  6. The inferior vena cava is dilated in size with >50% respiratory variability, suggesting right atrial pressure of 8 mmHg. Comparison(s): No prior Echocardiogram. FINDINGS  Left Ventricle: Left ventricular ejection fraction, by estimation, is 60 to 65%. The left ventricle has normal function. The left ventricle has no regional wall motion abnormalities. The left ventricular internal cavity size was small. There is mild asymmetric left ventricular hypertrophy of the basal-septal segment. Left ventricular diastolic parameters are indeterminate. Elevated left atrial pressure. Right Ventricle: The right ventricular size is normal. No increase in right ventricular wall thickness. Right ventricular systolic function is  normal. Tricuspid regurgitation signal is inadequate for assessing PA pressure. Left Atrium: Left atrial size was severely dilated. Right Atrium: Right atrial size was normal in size. Pericardium: There is no evidence of pericardial effusion. Mitral Valve: The mitral valve is normal in structure. Trivial mitral valve regurgitation. No evidence of mitral valve stenosis. Tricuspid Valve: The tricuspid valve is normal in structure. Tricuspid valve regurgitation is not demonstrated. No evidence of tricuspid stenosis. Aortic Valve: The aortic valve is tricuspid. Aortic valve regurgitation is not visualized. Pulmonic Valve: The pulmonic valve was not well visualized. Pulmonic valve regurgitation is not visualized. No evidence of pulmonic stenosis. Aorta: The aortic root and ascending aorta are structurally normal, with no evidence of dilitation. Venous: The inferior vena cava is dilated in size with greater than 50% respiratory variability, suggesting right atrial pressure of 8 mmHg. IAS/Shunts: The atrial septum is grossly normal.  LEFT VENTRICLE PLAX 2D LVIDd:         3.10 cm     Diastology LVIDs:         2.10 cm     LV e' medial:    6.53 cm/s LV PW:         1.00 cm     LV E/e' medial:  12.1 LV IVS:        1.30 cm     LV e' lateral:   8.05 cm/s LVOT diam:     2.20 cm     LV E/e' lateral: 9.9 LV SV:         129 LV SV Index:   80 LVOT Area:     3.80 cm  LV Volumes (MOD) LV vol d, MOD A2C: 75.6 ml LV  vol d, MOD A4C: 82.1 ml LV vol s, MOD A2C: 25.9 ml LV vol s, MOD A4C: 29.6 ml LV SV MOD A2C:     49.7 ml LV SV MOD A4C:     82.1 ml LV SV MOD BP:      50.7 ml RIGHT VENTRICLE             IVC RV S prime:     15.80 cm/s  IVC diam: 2.20 cm TAPSE (M-mode): 1.4 cm LEFT ATRIUM             Index        RIGHT ATRIUM           Index LA diam:        3.90 cm 2.41 cm/m   RA Area:     13.00 cm LA Vol (A2C):   97.3 ml 60.12 ml/m  RA Volume:   25.30 ml  15.63 ml/m LA Vol (A4C):   80.3 ml 49.62 ml/m LA Biplane Vol: 93.7 ml 57.90 ml/m   AORTIC VALVE LVOT Vmax:   147.00 cm/s LVOT Vmean:  98.800 cm/s LVOT VTI:    0.340 m  AORTA Ao Root diam: 3.50 cm Ao Asc diam:  3.20 cm MITRAL VALVE MV Area (PHT): 5.38 cm    SHUNTS MV Decel Time: 141 msec    Systemic VTI:  0.34 m MV E velocity: 79.30 cm/s  Systemic Diam: 2.20 cm MV A velocity: 56.90 cm/s MV E/A ratio:  1.39 Rudean Haskell MD Electronically signed by Rudean Haskell MD Signature Date/Time: 05/13/2022/2:18:19 PM    Final    DG CHEST PORT 1 VIEW  Result Date: 05/13/2022 CLINICAL DATA:  67 year old female presents with respiratory distress. EXAM: PORTABLE CHEST 1 VIEW COMPARISON:  May 09, 2022 FINDINGS: EKG leads project over the chest. Cardiomediastinal contours and hilar structures are normal. Diffuse interstitial and airspace opacities are similar with slightly diminished lung volumes. No pneumothorax. On limited assessment no acute skeletal findings. IMPRESSION: Similar appearance of the chest accounting for slightly diminished lung volumes with findings of chronic interstitial lung disease. With this pattern it would be difficult to exclude superimposed infection or edema. Electronically Signed   By: Zetta Bills M.D.   On: 05/13/2022 12:32   CT Angio Chest/Abd/Pel for Dissection W and/or Wo Contrast  Result Date: 05/09/2022 CLINICAL DATA:  Chest pain radiating into the neck, initial encounter EXAM: CT ANGIOGRAPHY CHEST, ABDOMEN AND PELVIS TECHNIQUE: Non-contrast CT of the chest was initially obtained. Multidetector CT imaging through the chest, abdomen and pelvis was performed using the standard protocol during bolus administration of intravenous contrast. Multiplanar reconstructed images and MIPs were obtained and reviewed to evaluate the vascular anatomy. RADIATION DOSE REDUCTION: This exam was performed according to the departmental dose-optimization program which includes automated exposure control, adjustment of the mA and/or kV according to patient size and/or use  of iterative reconstruction technique. CONTRAST:  119m OMNIPAQUE IOHEXOL 350 MG/ML SOLN COMPARISON:  07/15/2020 FINDINGS: CTA CHEST FINDINGS Cardiovascular: Initial precontrast images demonstrate atherosclerotic calcification of the thoracic aorta. No aneurysmal dilatation is seen. No hyperdense crescent to suggest acute aortic injury is noted. Postcontrast images were then obtained and reveal no evidence of aortic dissection. Heart is not significantly enlarged. The pulmonary artery shows a normal branching pattern bilaterally. No filling defect to suggest pulmonary embolism is noted. Coronary calcifications are noted. Mediastinum/Nodes: Thoracic inlet is within normal limits. Prominent AP window lymph node has resolved in the interval from the prior exam. Persistent right  hilar lymph nodes are seen likely reactive in nature. The esophagus as visualized is within normal limits. Lungs/Pleura: Lungs demonstrate diffuse fibrotic changes and honeycombing particularly in the bases. The previously seen diffuse superimposed infiltrates have resolved. There is a 4 mm nodule in the right upper lobe on image number 26 of series 9. No other sizable nodules are seen. No effusions are noted. Musculoskeletal: Degenerative changes of the thoracic spine are noted. No acute rib abnormality is seen. Chronic compression deformities of T5 and T7 are noted. Review of the MIP images confirms the above findings. CTA ABDOMEN AND PELVIS FINDINGS VASCULAR Aorta: Atherosclerotic calcifications are noted without aneurysmal dilatation or dissection. Celiac: Patent without evidence of aneurysm, dissection, vasculitis or significant stenosis. SMA: Patent without evidence of aneurysm, dissection, vasculitis or significant stenosis. Renals: Both renal arteries are patent without evidence of aneurysm, dissection, vasculitis, fibromuscular dysplasia or significant stenosis. IMA: Patent without evidence of aneurysm, dissection, vasculitis or  significant stenosis. Inflow: Iliacs demonstrate mild atherosclerotic calcification although no focal stenosis is seen. Veins: Visualized venous structures appear within normal limits. IVC filter is noted in satisfactory position. Review of the MIP images confirms the above findings. NON-VASCULAR Hepatobiliary: Gallbladder has been surgically removed. Liver shows no focal abnormality. Pancreas: Unremarkable. No pancreatic ductal dilatation or surrounding inflammatory changes. Spleen: Normal in size without focal abnormality. Adrenals/Urinary Tract: Adrenal glands are within normal limits. Kidneys demonstrate a normal enhancement pattern bilaterally. No renal calculi or obstructive changes are seen. The bladder is partially distended. Stomach/Bowel: Fecal material is noted scattered throughout the colon. No obstructive or inflammatory changes are seen. The appendix is not well visualized and may have been surgically. No inflammatory changes are seen. The small bowel and stomach are unremarkable. Lymphatic: No lymphadenopathy is noted. Reproductive: Status post hysterectomy. No adnexal masses. Other: No abdominal wall hernia or abnormality. No abdominopelvic ascites. Musculoskeletal: Postsurgical changes are noted in the lower lumbar spine. Prior vertebral augmentation at L1 is seen. Chronic L3 compression fracture is noted. Review of the MIP images confirms the above findings. IMPRESSION: CTA of the chest: No evidence of aortic injury. No pulmonary embolism is seen. Persistent right hilar lymph nodes likely reactive in nature. Chronic fibrotic changes are noted in the lungs bilaterally similar to prior exam. 4 mm right solid pulmonary nodule within the upper lobe. Per Fleischner Society Guidelines, a non-contrast Chest CT at 12 months is optional. If performed and the nodule is stable at 12 months, no further follow-up is recommended. These guidelines do not apply to immunocompromised patients and patients with  cancer. Follow up in patients with significant comorbidities as clinically warranted. For lung cancer screening, adhere to Lung-RADS guidelines. Reference: Radiology. 2017; 284(1):228-43. CTA of the abdomen and pelvis: No aortic abnormality is noted. No acute abnormality is seen. Electronically Signed   By: Inez Catalina M.D.   On: 05/09/2022 23:40   CT Head Wo Contrast  Result Date: 05/09/2022 CLINICAL DATA:  New onset headache EXAM: CT HEAD WITHOUT CONTRAST TECHNIQUE: Contiguous axial images were obtained from the base of the skull through the vertex without intravenous contrast. RADIATION DOSE REDUCTION: This exam was performed according to the departmental dose-optimization program which includes automated exposure control, adjustment of the mA and/or kV according to patient size and/or use of iterative reconstruction technique. COMPARISON:  None Available. FINDINGS: Brain: There is no mass, hemorrhage or extra-axial collection. The size and configuration of the ventricles and extra-axial CSF spaces are normal. The brain parenchyma is normal, without acute or chronic  infarction. Vascular: No abnormal hyperdensity of the major intracranial arteries or dural venous sinuses. No intracranial atherosclerosis. Skull: The visualized skull base, calvarium and extracranial soft tissues are normal. Sinuses/Orbits: No fluid levels or advanced mucosal thickening of the visualized paranasal sinuses. No mastoid or middle ear effusion. The orbits are normal. IMPRESSION: Normal head CT. Electronically Signed   By: Ulyses Jarred M.D.   On: 05/09/2022 23:22   DG Chest Portable 1 View  Result Date: 05/09/2022 CLINICAL DATA:  Chest pain and shortness of breath EXAM: PORTABLE CHEST 1 VIEW COMPARISON:  07/15/2020 FINDINGS: Cardiac shadow is stable. Diffuse fibrotic changes are identified throughout both lungs. No focal infiltrate or sizable effusion is seen. No bony abnormality is noted. IMPRESSION: Changes consistent with  pulmonary fibrosis without acute abnormality. Electronically Signed   By: Inez Catalina M.D.   On: 05/09/2022 22:03    Barton Dubois, MD  [facility]  If 7PM-7AM, please contact night-coverage [email or web address] Password [facility] 05/19/2022, 3:53 PM   LOS: 9 days

## 2022-05-20 DIAGNOSIS — F411 Generalized anxiety disorder: Secondary | ICD-10-CM | POA: Diagnosis not present

## 2022-05-20 DIAGNOSIS — J9621 Acute and chronic respiratory failure with hypoxia: Secondary | ICD-10-CM | POA: Diagnosis not present

## 2022-05-20 DIAGNOSIS — E039 Hypothyroidism, unspecified: Secondary | ICD-10-CM | POA: Diagnosis not present

## 2022-05-20 DIAGNOSIS — J84112 Idiopathic pulmonary fibrosis: Secondary | ICD-10-CM | POA: Diagnosis not present

## 2022-05-20 LAB — GLUCOSE, CAPILLARY
Glucose-Capillary: 133 mg/dL — ABNORMAL HIGH (ref 70–99)
Glucose-Capillary: 188 mg/dL — ABNORMAL HIGH (ref 70–99)
Glucose-Capillary: 197 mg/dL — ABNORMAL HIGH (ref 70–99)
Glucose-Capillary: 217 mg/dL — ABNORMAL HIGH (ref 70–99)

## 2022-05-20 NOTE — Plan of Care (Signed)

## 2022-05-20 NOTE — Progress Notes (Signed)
PROGRESS NOTE  [name] Fitzpatrick [id number] DOB: [birth year] DOA: 05/09/2022 PCP: Joya Gaskins, FNP  Brief History:  67 year old female with a history of chronic respiratory failure on 6 L, pulmonary fibrosis, diabetes mellitus type 2, anemia of chronic disease, anxiety, hypertension presenting with worsening cough and shortness of breath over the last 2 to 3 days.  The patient states that she has been having some headaches which began on 05/04/2022 followed by some emesis.  She states that her emesis has improved, unfortunately, her shortness of breath and coughing have worsened.  She has had some fevers at home up to 101.2 F.  She denies any hemoptysis, abdominal pain, diarrhea, hematochezia, melena.  She denies any focal extremity weakness or visual loss. She denies any worsening lower extremity edema. In the ED, the patient was afebrile with soft blood pressures in the 90s.  WBC 11.8, hemoglobin 10.2, platelets 222,000. BMP showed sodium 134, potassium 4.1, bicarbonate 26, BUN 20, creatinine 0.74.  LFTs unremarkable.  BNP 274.  CTA chest was negative for PE, negative dissection.  There is diffuse fibrotic changes with honeycombing.  There is a right upper lobe 4 mm nodule.  CTA abdomen pelvis was negative for any acute findings.  EKG shows sinus rhythm with incomplete right bundle branch block.  Troponin 11>>   Assessment/Plan: Acute on chronic respiratory failure with hypoxia -Secondary to exacerbation of pulmonary fibrosis/COPD. -Suspect underlying viral infection -Chronically on 6 L nasal cannula at home -On 15 L high flow>>wean for saturation 88-95%>>11-12L -05/09/2022 CTA chest negative for PE, consolidation -Pulmonary consult appreciated -Continue IV Solu-Medrol -continue Pulmicort, Maretta Bees and Brovana -COVID-19 PCR negative, RSV negative, influenza negative -check PCT <0.10 -viral resp panel--neg 2/27 AM--had episode respiratory distress with CP --repeat  CXR --continue morphine IV 1 mg for episodes of distress -- Continue as needed Hycodan --2/25 Echo--EF 60-65%, no WMA, normal RVF, trivial MR --check troponins, bmp, cbc. -Patient-overall condition guarded and poor.  Per her reports outpatient pulmonologist and cardiologist has expressed reaching terminal illness and she used to be on home hospice. -Appreciate assistance and recommendation by pulmonology. -ESR has been checked and is only 2 currently. -Continue to follow clinical response. -Overall long-term prognosis is poor and guarded.   COPD exacerbation -continue IV solumedrol at current dose. -continue Yupelri, brovana, pulmicort -Continue to wean off oxygen supplementation as tolerated.  Acute on chronic sinusitis -After discussing with pulmonology service will provide treatment of any underlying infection. -Loratadine has been added to assist with congestion -Doxycycline x 7 days will be provided; patient allergic to penicillin.  (Day 2 out of 7).   Anxiety disorder -Continue home dose alprazolam, Zoloft -continue ativan prn   Opioid dependence -PDMP reviewed--patient receives morphine ER 15 mg, 90 monthly -Patient receives Dilaudid 4 mg, #120 monthly -Patient has been continue on extended release morphine every 12 hours, as needed Dilaudid and also IV morphine for breakthrough.   Hypothyroidism -Continue Synthroid   Diabetes mellitus type 2 -Appears to be managed by lifestyle modification -NovoLog sliding scale -Checked A1c--5.3 -CBGs controlled for the most part.  Continue to follow trend.   Mixed hyperlipidemia -continue statin.   [place] -Patient has expressed wanting full scope treatment practice and resuscitation if required -Appreciate assistance and recommendation by palliative care.   Subjective: No fever, no nausea or vomiting; continue to be short winded with minimal activity and experiencing intermittent episode of coughing spells and tachypnea.  9 L  high flow  nasal cannula supplementation in place.  Objective: Vitals:   05/20/22 0900 05/20/22 1000 05/20/22 1117 05/20/22 1622  BP: (!) 116/53 (!) 101/46    Pulse:      Resp: (!) 30 (!) 31    Temp:   99.9 F (37.7 C) 98 F (36.7 C)  TempSrc:   Oral Oral  SpO2:      Weight:      Height:        Intake/Output Summary (Last 24 hours) at 05/20/2022 1708 Last data filed at 05/20/2022 1117 Gross per 24 hour  Intake 480 ml  Output --  Net 480 ml   Weight change: -0.7 kg  Exam: General exam: Alert, awake, oriented x 3; reports feeling somewhat better.  Still short winded with minimal activity and requiring 9 L high flow nasal cannula supplementation.  Patient is afebrile. Respiratory system: Diffuse rhonchi bilaterally; positive expiratory wheezing and intermittent tachypnea appreciated on exam. Cardiovascular system: Rate controlled at time of examination; telemetry evaluation demonstrated intermittent episodes of nonsustained tachycardia and A-fib.  No rubs or gallops appreciated on exam.  No JVD. Gastrointestinal system: Abdomen is nondistended, soft and nontender. No organomegaly or masses felt. Normal bowel sounds heard. Central nervous system: Alert and oriented. No focal neurological deficits. Extremities: No cyanosis; positive clubbing and trace edema on exam. Skin: No petechiae. Psychiatry: Judgement and insight appear normal. Mood & affect appropriate.   Data Reviewed: I have personally reviewed following labs and imaging studies  Basic Metabolic Panel: Recent Labs  Lab 05/15/22 0851  NA 133*  K 4.3  CL 93*  CO2 34*  GLUCOSE 108*  BUN 14  CREATININE 0.50  CALCIUM 8.6*  MG 1.8  PHOS 3.8   CBC: Recent Labs  Lab 05/15/22 0851  WBC 10.4  HGB 10.6*  HCT 33.9*  MCV 90.2  PLT 227   CBG: Recent Labs  Lab 05/19/22 1555 05/19/22 2023 05/20/22 0745 05/20/22 1115 05/20/22 1621  GLUCAP 151* 88 133* 188* 197*   Urine analysis:    Component Value Date/Time    COLORURINE YELLOW 05/10/2022 1550   APPEARANCEUR HAZY (A) 05/10/2022 1550   LABSPEC >1.046 (H) 05/10/2022 1550   PHURINE 5.0 05/10/2022 1550   GLUCOSEU NEGATIVE 05/10/2022 1550   HGBUR NEGATIVE 05/10/2022 1550   HGBUR negative 05/17/2006 0000   BILIRUBINUR NEGATIVE 05/10/2022 1550   BILIRUBINUR small 03/30/2015 1546   KETONESUR 5 (A) 05/10/2022 1550   PROTEINUR 30 (A) 05/10/2022 1550   UROBILINOGEN 0.2 03/30/2015 1546   UROBILINOGEN 0.2 11/08/2013 0237   NITRITE NEGATIVE 05/10/2022 1550   LEUKOCYTESUR NEGATIVE 05/10/2022 1550   Sepsis Labs:  No results found for this or any previous visit (from the past 240 hour(s)).    Scheduled Meds:  ALPRAZolam  0.5 mg Oral TID   arformoterol  15 mcg Nebulization BID   aspirin EC  81 mg Oral Daily   budesonide (PULMICORT) nebulizer solution  0.5 mg Nebulization BID   Chlorhexidine Gluconate Cloth  6 each Topical Q0600   clotrimazole  10 mg Oral 5 X Daily   docusate sodium  200 mg Oral BID   doxycycline  100 mg Oral Q12H   fluticasone  2 spray Each Nare Daily   guaiFENesin  1,200 mg Oral BID   insulin aspart  0-6 Units Subcutaneous TID WC   ipratropium  2 spray Each Nare QID   levothyroxine  175 mcg Oral Q0600   loratadine  10 mg Oral Daily   methylPREDNISolone (SOLU-MEDROL) injection  80 mg Intravenous Q12H   morphine  15 mg Oral Q12H   mouth rinse  15 mL Mouth Rinse 4 times per day   pantoprazole  40 mg Oral BID AC   revefenacin  175 mcg Nebulization Daily   rosuvastatin  20 mg Oral Daily   senna-docusate  1 tablet Oral QHS   sertraline  100 mg Oral QHS   sodium chloride  2 spray Each Nare BID   Continuous Infusions:  Procedures/Studies: CT MAXILLOFACIAL  LTD WO CM  Result Date: 05/19/2022 CLINICAL DATA:  Initial evaluation for cough. EXAM: CT PARANASAL SINUS LIMITED WITHOUT CONTRAST TECHNIQUE: Multidetector CT images of the paranasal sinuses were obtained using the standard protocol without intravenous contrast. RADIATION  DOSE REDUCTION: This exam was performed according to the departmental dose-optimization program which includes automated exposure control, adjustment of the mA and/or kV according to patient size and/or use of iterative reconstruction technique. COMPARISON:  Prior head CT from 05/09/2022. FINDINGS: Paranasal sinuses: Frontal: Normally aerated. Patent frontal sinus drainage pathways. Ethmoid: Probable changes of prior partial ethmoidectomy. Mild-to-moderate mucoperiosteal thickening about the residual ethmoidal air cells, chronic in appearance. Maxillary: Mild chronic mucoperiosteal thickening present about the maxillary sinuses bilaterally, slightly worse on the right. Sphenoid: Trace layering fluid with pneumatized secretions present within the sphenoid sinuses. Left sphenoid sinus dominant. Right ostiomeatal unit: Patent with sequelae of prior antrostomy with uncinectomy. Left ostiomeatal unit: Patent with sequelae of prior antrostomy with uncinectomy. Nasal passages: Postoperative changes from prior bilateral turbinectomy. Nasal passages are clear. No more than trace 2 mm left-to-right nasal septal deviation. Anatomy: No pneumatization superior to anterior ethmoid notches. Symmetric and intact olfactory grooves and fovea ethmoidalis, Keros II (4-6m). Presellar sphenoid pneumatization pattern. No dehiscence of carotid or optic canals. No onodi cell. Other: Mastoid air cells and middle ear cavities are well pneumatized and free of fluid. Visualized intracranial contents demonstrate no significant finding. Scattered vascular calcifications noted within the carotid siphons. Prior bilateral ocular lens replacement. Globes and orbital soft tissues demonstrate no acute finding. Remainder of the visualized soft tissues of the face are within normal limits. IMPRESSION: Sequelae of prior endoscopic sinus surgery with mild to moderate chronic mucoperiosteal thickening about the residual ethmoidal air cells and maxillary  sinuses. Trace layering fluid with pneumatized secretions within the sphenoid sinuses, suggesting acute on chronic sinus disease. Electronically Signed   By: BJeannine BogaM.D.   On: 05/19/2022 02:50   DG CHEST PORT 1 VIEW  Result Date: 05/15/2022 CLINICAL DATA:  Respiratory distress EXAM: PORTABLE CHEST 1 VIEW COMPARISON:  05/13/2022.  CTA chest of 05/09/2022 FINDINGS: Midline trachea. Normal heart size. No pleural effusion or pneumothorax. Underlying basilar and peripheral predominant accentuated pulmonary interstitium, consistent with pulmonary fibrosis. No convincing evidence of acute superimposed lobar consolidation. IMPRESSION: Severe interstitial lung disease. This limits sensitivity of plain films. No convincing evidence of acute superimposed process. Electronically Signed   By: KAbigail MiyamotoM.D.   On: 05/15/2022 08:50   ECHOCARDIOGRAM COMPLETE  Result Date: 05/13/2022    ECHOCARDIOGRAM REPORT   Patient Name:   SLIVANA OBERLYDate of Exam: 05/13/2022 Medical Rec #:  [id number]          Height:       62.0 in Accession #:    [id number]         Weight:       135.1 lb Date of Birth:  105/10/1955          BSA:  1.618 m Patient Age:    5 years            BP:           116/52 mmHg Patient Gender: F                   HR:           60 bpm. Exam Location:  [facility] Procedure: 2D Echo, Cardiac Doppler and Color Doppler Indications:    R07.9* Chest pain, unspecified; R06.02 SOB  History:        Patient has no prior history of Echocardiogram examinations.                 Abnormal ECG, COPD, Arrythmias:Tachycardia;                 Signs/Symptoms:Shortness of Breath, Dyspnea and Chest Pain.                 Pulmonary fibrosis.  Sonographer:    Roseanna Rainbow RDCS Referring Phys: [phone] DAVID TAT  Sonographer Comments: Technically difficult study due to poor echo windows. Chest wall deformity. IMPRESSIONS  1. Left ventricular ejection fraction, by estimation, is 60 to 65%. The left ventricle has  normal function. The left ventricle has no regional wall motion abnormalities. There is mild asymmetric left ventricular hypertrophy of the basal-septal segment. Left ventricular diastolic parameters are indeterminate. Elevated left atrial pressure.  2. Right ventricular systolic function is normal. The right ventricular size is normal. Tricuspid regurgitation signal is inadequate for assessing PA pressure.  3. Left atrial size was severely dilated.  4. The mitral valve is normal in structure. Trivial mitral valve regurgitation. No evidence of mitral stenosis.  5. The aortic valve is tricuspid. Aortic valve regurgitation is not visualized.  6. The inferior vena cava is dilated in size with >50% respiratory variability, suggesting right atrial pressure of 8 mmHg. Comparison(s): No prior Echocardiogram. FINDINGS  Left Ventricle: Left ventricular ejection fraction, by estimation, is 60 to 65%. The left ventricle has normal function. The left ventricle has no regional wall motion abnormalities. The left ventricular internal cavity size was small. There is mild asymmetric left ventricular hypertrophy of the basal-septal segment. Left ventricular diastolic parameters are indeterminate. Elevated left atrial pressure. Right Ventricle: The right ventricular size is normal. No increase in right ventricular wall thickness. Right ventricular systolic function is normal. Tricuspid regurgitation signal is inadequate for assessing PA pressure. Left Atrium: Left atrial size was severely dilated. Right Atrium: Right atrial size was normal in size. Pericardium: There is no evidence of pericardial effusion. Mitral Valve: The mitral valve is normal in structure. Trivial mitral valve regurgitation. No evidence of mitral valve stenosis. Tricuspid Valve: The tricuspid valve is normal in structure. Tricuspid valve regurgitation is not demonstrated. No evidence of tricuspid stenosis. Aortic Valve: The aortic valve is tricuspid. Aortic valve  regurgitation is not visualized. Pulmonic Valve: The pulmonic valve was not well visualized. Pulmonic valve regurgitation is not visualized. No evidence of pulmonic stenosis. Aorta: The aortic root and ascending aorta are structurally normal, with no evidence of dilitation. Venous: The inferior vena cava is dilated in size with greater than 50% respiratory variability, suggesting right atrial pressure of 8 mmHg. IAS/Shunts: The atrial septum is grossly normal.  LEFT VENTRICLE PLAX 2D LVIDd:         3.10 cm     Diastology LVIDs:         2.10 cm     LV e'  medial:    6.53 cm/s LV PW:         1.00 cm     LV E/e' medial:  12.1 LV IVS:        1.30 cm     LV e' lateral:   8.05 cm/s LVOT diam:     2.20 cm     LV E/e' lateral: 9.9 LV SV:         129 LV SV Index:   80 LVOT Area:     3.80 cm  LV Volumes (MOD) LV vol d, MOD A2C: 75.6 ml LV vol d, MOD A4C: 82.1 ml LV vol s, MOD A2C: 25.9 ml LV vol s, MOD A4C: 29.6 ml LV SV MOD A2C:     49.7 ml LV SV MOD A4C:     82.1 ml LV SV MOD BP:      50.7 ml RIGHT VENTRICLE             IVC RV S prime:     15.80 cm/s  IVC diam: 2.20 cm TAPSE (M-mode): 1.4 cm LEFT ATRIUM             Index        RIGHT ATRIUM           Index LA diam:        3.90 cm 2.41 cm/m   RA Area:     13.00 cm LA Vol (A2C):   97.3 ml 60.12 ml/m  RA Volume:   25.30 ml  15.63 ml/m LA Vol (A4C):   80.3 ml 49.62 ml/m LA Biplane Vol: 93.7 ml 57.90 ml/m  AORTIC VALVE LVOT Vmax:   147.00 cm/s LVOT Vmean:  98.800 cm/s LVOT VTI:    0.340 m  AORTA Ao Root diam: 3.50 cm Ao Asc diam:  3.20 cm MITRAL VALVE MV Area (PHT): 5.38 cm    SHUNTS MV Decel Time: 141 msec    Systemic VTI:  0.34 m MV E velocity: 79.30 cm/s  Systemic Diam: 2.20 cm MV A velocity: 56.90 cm/s MV E/A ratio:  1.39 Rudean Haskell MD Electronically signed by Rudean Haskell MD Signature Date/Time: 05/13/2022/2:18:19 PM    Final    DG CHEST PORT 1 VIEW  Result Date: 05/13/2022 CLINICAL DATA:  67 year old female presents with respiratory distress.  EXAM: PORTABLE CHEST 1 VIEW COMPARISON:  May 09, 2022 FINDINGS: EKG leads project over the chest. Cardiomediastinal contours and hilar structures are normal. Diffuse interstitial and airspace opacities are similar with slightly diminished lung volumes. No pneumothorax. On limited assessment no acute skeletal findings. IMPRESSION: Similar appearance of the chest accounting for slightly diminished lung volumes with findings of chronic interstitial lung disease. With this pattern it would be difficult to exclude superimposed infection or edema. Electronically Signed   By: Zetta Bills M.D.   On: 05/13/2022 12:32   CT Angio Chest/Abd/Pel for Dissection W and/or Wo Contrast  Result Date: 05/09/2022 CLINICAL DATA:  Chest pain radiating into the neck, initial encounter EXAM: CT ANGIOGRAPHY CHEST, ABDOMEN AND PELVIS TECHNIQUE: Non-contrast CT of the chest was initially obtained. Multidetector CT imaging through the chest, abdomen and pelvis was performed using the standard protocol during bolus administration of intravenous contrast. Multiplanar reconstructed images and MIPs were obtained and reviewed to evaluate the vascular anatomy. RADIATION DOSE REDUCTION: This exam was performed according to the departmental dose-optimization program which includes automated exposure control, adjustment of the mA and/or kV according to patient size and/or use of iterative reconstruction technique. CONTRAST:  148m OMNIPAQUE IOHEXOL 350 MG/ML SOLN COMPARISON:  07/15/2020 FINDINGS: CTA CHEST FINDINGS Cardiovascular: Initial precontrast images demonstrate atherosclerotic calcification of the thoracic aorta. No aneurysmal dilatation is seen. No hyperdense crescent to suggest acute aortic injury is noted. Postcontrast images were then obtained and reveal no evidence of aortic dissection. Heart is not significantly enlarged. The pulmonary artery shows a normal branching pattern bilaterally. No filling defect to suggest pulmonary  embolism is noted. Coronary calcifications are noted. Mediastinum/Nodes: Thoracic inlet is within normal limits. Prominent AP window lymph node has resolved in the interval from the prior exam. Persistent right hilar lymph nodes are seen likely reactive in nature. The esophagus as visualized is within normal limits. Lungs/Pleura: Lungs demonstrate diffuse fibrotic changes and honeycombing particularly in the bases. The previously seen diffuse superimposed infiltrates have resolved. There is a 4 mm nodule in the right upper lobe on image number 26 of series 9. No other sizable nodules are seen. No effusions are noted. Musculoskeletal: Degenerative changes of the thoracic spine are noted. No acute rib abnormality is seen. Chronic compression deformities of T5 and T7 are noted. Review of the MIP images confirms the above findings. CTA ABDOMEN AND PELVIS FINDINGS VASCULAR Aorta: Atherosclerotic calcifications are noted without aneurysmal dilatation or dissection. Celiac: Patent without evidence of aneurysm, dissection, vasculitis or significant stenosis. SMA: Patent without evidence of aneurysm, dissection, vasculitis or significant stenosis. Renals: Both renal arteries are patent without evidence of aneurysm, dissection, vasculitis, fibromuscular dysplasia or significant stenosis. IMA: Patent without evidence of aneurysm, dissection, vasculitis or significant stenosis. Inflow: Iliacs demonstrate mild atherosclerotic calcification although no focal stenosis is seen. Veins: Visualized venous structures appear within normal limits. IVC filter is noted in satisfactory position. Review of the MIP images confirms the above findings. NON-VASCULAR Hepatobiliary: Gallbladder has been surgically removed. Liver shows no focal abnormality. Pancreas: Unremarkable. No pancreatic ductal dilatation or surrounding inflammatory changes. Spleen: Normal in size without focal abnormality. Adrenals/Urinary Tract: Adrenal glands are within  normal limits. Kidneys demonstrate a normal enhancement pattern bilaterally. No renal calculi or obstructive changes are seen. The bladder is partially distended. Stomach/Bowel: Fecal material is noted scattered throughout the colon. No obstructive or inflammatory changes are seen. The appendix is not well visualized and may have been surgically. No inflammatory changes are seen. The small bowel and stomach are unremarkable. Lymphatic: No lymphadenopathy is noted. Reproductive: Status post hysterectomy. No adnexal masses. Other: No abdominal wall hernia or abnormality. No abdominopelvic ascites. Musculoskeletal: Postsurgical changes are noted in the lower lumbar spine. Prior vertebral augmentation at L1 is seen. Chronic L3 compression fracture is noted. Review of the MIP images confirms the above findings. IMPRESSION: CTA of the chest: No evidence of aortic injury. No pulmonary embolism is seen. Persistent right hilar lymph nodes likely reactive in nature. Chronic fibrotic changes are noted in the lungs bilaterally similar to prior exam. 4 mm right solid pulmonary nodule within the upper lobe. Per Fleischner Society Guidelines, a non-contrast Chest CT at 12 months is optional. If performed and the nodule is stable at 12 months, no further follow-up is recommended. These guidelines do not apply to immunocompromised patients and patients with cancer. Follow up in patients with significant comorbidities as clinically warranted. For lung cancer screening, adhere to Lung-RADS guidelines. Reference: Radiology. 2017; 284(1):228-43. CTA of the abdomen and pelvis: No aortic abnormality is noted. No acute abnormality is seen. Electronically Signed   By: MInez CatalinaM.D.   On: 05/09/2022 23:40   CT Head Wo Contrast  Result  Date: 05/09/2022 CLINICAL DATA:  New onset headache EXAM: CT HEAD WITHOUT CONTRAST TECHNIQUE: Contiguous axial images were obtained from the base of the skull through the vertex without intravenous  contrast. RADIATION DOSE REDUCTION: This exam was performed according to the departmental dose-optimization program which includes automated exposure control, adjustment of the mA and/or kV according to patient size and/or use of iterative reconstruction technique. COMPARISON:  None Available. FINDINGS: Brain: There is no mass, hemorrhage or extra-axial collection. The size and configuration of the ventricles and extra-axial CSF spaces are normal. The brain parenchyma is normal, without acute or chronic infarction. Vascular: No abnormal hyperdensity of the major intracranial arteries or dural venous sinuses. No intracranial atherosclerosis. Skull: The visualized skull base, calvarium and extracranial soft tissues are normal. Sinuses/Orbits: No fluid levels or advanced mucosal thickening of the visualized paranasal sinuses. No mastoid or middle ear effusion. The orbits are normal. IMPRESSION: Normal head CT. Electronically Signed   By: Ulyses Jarred M.D.   On: 05/09/2022 23:22   DG Chest Portable 1 View  Result Date: 05/09/2022 CLINICAL DATA:  Chest pain and shortness of breath EXAM: PORTABLE CHEST 1 VIEW COMPARISON:  07/15/2020 FINDINGS: Cardiac shadow is stable. Diffuse fibrotic changes are identified throughout both lungs. No focal infiltrate or sizable effusion is seen. No bony abnormality is noted. IMPRESSION: Changes consistent with pulmonary fibrosis without acute abnormality. Electronically Signed   By: Inez Catalina M.D.   On: 05/09/2022 22:03    Barton Dubois, MD  [facility]  If 7PM-7AM, please contact night-coverage [email or web address] Password [email or web address] 05/20/2022, 5:08 PM   LOS: 10 days

## 2022-05-21 ENCOUNTER — Telehealth: Payer: Self-pay | Admitting: Pulmonary Disease

## 2022-05-21 DIAGNOSIS — J849 Interstitial pulmonary disease, unspecified: Secondary | ICD-10-CM

## 2022-05-21 DIAGNOSIS — J9621 Acute and chronic respiratory failure with hypoxia: Secondary | ICD-10-CM | POA: Diagnosis not present

## 2022-05-21 DIAGNOSIS — F411 Generalized anxiety disorder: Secondary | ICD-10-CM | POA: Diagnosis not present

## 2022-05-21 DIAGNOSIS — E039 Hypothyroidism, unspecified: Secondary | ICD-10-CM | POA: Diagnosis not present

## 2022-05-21 DIAGNOSIS — J84112 Idiopathic pulmonary fibrosis: Secondary | ICD-10-CM | POA: Diagnosis not present

## 2022-05-21 LAB — CBC
HCT: 31.5 % — ABNORMAL LOW (ref 36.0–46.0)
Hemoglobin: 10.2 g/dL — ABNORMAL LOW (ref 12.0–15.0)
MCH: 28.7 pg (ref 26.0–34.0)
MCHC: 32.4 g/dL (ref 30.0–36.0)
MCV: 88.7 fL (ref 80.0–100.0)
Platelets: 178 10*3/uL (ref 150–400)
RBC: 3.55 MIL/uL — ABNORMAL LOW (ref 3.87–5.11)
RDW: 14 % (ref 11.5–15.5)
WBC: 20 10*3/uL — ABNORMAL HIGH (ref 4.0–10.5)
nRBC: 0 % (ref 0.0–0.2)

## 2022-05-21 LAB — BASIC METABOLIC PANEL
Anion gap: 9 (ref 5–15)
BUN: 14 mg/dL (ref 8–23)
CO2: 30 mmol/L (ref 22–32)
Calcium: 8.5 mg/dL — ABNORMAL LOW (ref 8.9–10.3)
Chloride: 94 mmol/L — ABNORMAL LOW (ref 98–111)
Creatinine, Ser: 0.55 mg/dL (ref 0.44–1.00)
GFR, Estimated: >60 mL/min (ref >60)
Glucose, Bld: 156 mg/dL — ABNORMAL HIGH (ref 70–99)
Potassium: 3.9 mmol/L (ref 3.5–5.1)
Sodium: 133 mmol/L — ABNORMAL LOW (ref 135–145)

## 2022-05-21 LAB — GLUCOSE, CAPILLARY
Glucose-Capillary: 110 mg/dL — ABNORMAL HIGH (ref 70–99)
Glucose-Capillary: 188 mg/dL — ABNORMAL HIGH (ref 70–99)
Glucose-Capillary: 145 mg/dL — ABNORMAL HIGH (ref 70–99)
Glucose-Capillary: 169 mg/dL — ABNORMAL HIGH (ref 70–99)

## 2022-05-21 MED ORDER — METHYLPREDNISOLONE SODIUM SUCC 40 MG IJ SOLR
40.0000 mg | Freq: Two times a day (BID) | INTRAMUSCULAR | Status: DC
  Administered 2022-05-21 – 2022-05-24 (×6): 40 mg via INTRAVENOUS
  Filled 2022-05-21 (×6): qty 1

## 2022-05-21 NOTE — Telephone Encounter (Signed)
Outpatient follow-up, posthospital visit, in 3 to 4 weeks with me please Outpatient referral to pulmonary rehab

## 2022-05-21 NOTE — Care Management Important Message (Signed)
Important Message  Patient Details  Name: [name] Fitzpatrick MRN: [id number] Date of Birth: 21-Aug-1955   Medicare Important Message Given:  Yes     [name] Fitzpatrick 05/21/2022, 4:33 PM

## 2022-05-21 NOTE — Telephone Encounter (Signed)
HFU appt made and appt reminder mailed.

## 2022-05-21 NOTE — TOC Initial Note (Signed)
Transition of Care [facility]) - Initial/Assessment Note    Patient Details  Name: [name] Fitzpatrick MRN: [id number] Date of Birth: [birth year]  Transition of Care [facility]) CM/SW Contact:    Boneta Lucks, RN Phone Number: 05/21/2022, 11:46 AM  Clinical Narrative:      Patient admitted with acute on chronic hypoxic respiratory failure. Patient is on HF, TOC consult to confirm she has a 10L concentrator at home. CM spoke with patient she uses 8L at baseline and her concentrator will go up to 10 L. She is asking about home health, discussed with MD.  He is ordering RN/PT/PT she is agreeable to referring to [place] referral to Judson Roch for approved.     TOC call Ashly with [employer]. She will check to see what equipment the patient has in the home and see if she qualifies for a NIV. TOC following.           Expected Discharge Plan: [place] Barriers to Discharge: Continued Medical Work up   Patient Goals and CMS Choice Patient states their goals for this hospitalization and ongoing recovery are:: to go home. CMS Medicare.gov Compare Post Acute Care list provided to:: Patient Choice offered to / list presented to : Patient     Expected Discharge Plan and Services      Living arrangements for the past 2 months: Single Family Home            Date [place]: 05/21/22 Time [place]: [id number] Representative spoke with at [place]: Billey Chang  Prior Living Arrangements/Services Living arrangements for the past 2 months: [place] with:: Spouse   Do you feel safe going back to the place where you live?: Yes            Activities of Daily Living Home Assistive Devices/Equipment: Bedside commode/3-in-1, Cane (specify quad or straight), CBG Meter, Dentures (specify type), Eyeglasses, Nebulizer, Oxygen, Shower chair with back, Walker (specify type) ADL Screening (condition at time of admission) Patient's cognitive ability adequate  to safely complete daily activities?: Yes Is the patient deaf or have difficulty hearing?: No Does the patient have difficulty seeing, even when wearing glasses/contacts?: No Does the patient have difficulty concentrating, remembering, or making decisions?: No Patient able to express need for assistance with ADLs?: Yes Does the patient have difficulty dressing or bathing?: Yes Independently performs ADLs?: No Communication: Independent Dressing (OT): Needs assistance Is this a change from baseline?: Change from baseline, expected to last <3days Grooming: Independent Feeding: Independent Bathing: Needs assistance Is this a change from baseline?: Change from baseline, expected to last <3 days Toileting: Needs assistance Is this a change from baseline?: Change from baseline, expected to last <3 days In/Out Bed: Needs assistance Is this a change from baseline?: Change from baseline, expected to last <3 days Walks in Home: Independent with device (comment) Does the patient have difficulty walking or climbing stairs?: Yes Weakness of Legs: Both Weakness of Arms/Hands: Both  Permission Sought/Granted        Emotional Assessment     Affect (typically observed): Accepting Orientation: : Oriented to Self, Oriented to Place, Oriented to  Time, Oriented to Situation Alcohol / Substance Use: Not Applicable Psych Involvement: No (comment)  Admission diagnosis:  Acute on chronic respiratory failure with hypoxia (HCC) [J96.21] Acute on chronic hypoxic respiratory failure (HCC) [J96.21] Patient Active Problem List   Diagnosis Date Noted   Acute on chronic hypoxic respiratory failure ([place]) 05/10/2022  Acute exacerbation of idiopathic pulmonary fibrosis ([place]) 05/10/2022   Leukocytosis 05/10/2022   DM2 (diabetes mellitus, type 2) ([place]) 05/10/2022   Anemia of chronic disease 05/10/2022   Pulmonary fibrosis ([place]) 07/17/2020   Community acquired pneumonia 07/16/2020   Tachycardia 07/16/2020    Acute on chronic respiratory failure ([place]) 05/25/2020   Acute on chronic respiratory failure with hypoxia (HCC)    Seizure disorder (HCC)    Acute deep vein thrombosis (DVT) of left femoral vein (HCC)    Interstitial pneumonia (HCC)    ARDS (adult respiratory distress syndrome) (HCC)    Chest pain 09/09/2015   Cellulitis of leg, left 09/09/2015   Glossitis 09/09/2015   GAD (generalized anxiety disorder) 09/09/2015   Headache, acute 09/08/2015   Bilateral hand pain 06/07/2015   Nausea without vomiting 06/06/2015   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   Knee pain, left 04/03/2015   Shoulder pain, right 04/03/2015   Urinary incontinence 04/03/2015   Multiple lipomas 11/08/2013   Compression fracture of L1 lumbar vertebra ([place]) 11/08/2013   Fall at home, initial encounter 11/08/2013   Dyspnea 08/14/2012   Obesity (BMI 30-39.9) 03/23/2012   IGT (impaired glucose tolerance) 03/23/2012   Vaginal dryness, menopausal 08/28/2011   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 [place] [address] Phone: [phone] Fax: [phone]    Social Determinants of Health (SDOH) Social History: SDOH Screenings   Food Insecurity: No Food Insecurity (05/11/2022)  Housing: Low Risk  (05/11/2022)  Transportation Needs: No Transportation Needs (05/11/2022)  Utilities: Not At Risk (05/11/2022)  Tobacco Use: Medium Risk (05/14/2022)   SDOH Interventions:      Readmission Risk Interventions    05/21/2022   11:38 AM 07/18/2020   11:09 AM  Readmission Risk Prevention Plan  Transportation Screening Complete Complete  PCP or Specialist Appt within 3-5 Days Not Complete   Home Care Screening  Complete  Medication Review (RN CM)  Complete  HRI or Home Care Consult Complete   Social Work Consult for [place] Planning/Counseling Complete   Palliative Care Screening Not Applicable   Medication Review Press photographer) Complete

## 2022-05-21 NOTE — Plan of Care (Signed)

## 2022-05-21 NOTE — Progress Notes (Signed)
PROGRESS NOTE  ARKISHA PENFOLD [id number] DOB: [birth year] DOA: 05/09/2022 PCP: Joya Gaskins, FNP  Brief History:  67 year old female with a history of chronic respiratory failure on 6 L, pulmonary fibrosis, diabetes mellitus type 2, anemia of chronic disease, anxiety, hypertension presenting with worsening cough and shortness of breath over the last 2 to 3 days.  The patient states that she has been having some headaches which began on 05/04/2022 followed by some emesis.  She states that her emesis has improved, unfortunately, her shortness of breath and coughing have worsened.  She has had some fevers at home up to 101.2 F.  She denies any hemoptysis, abdominal pain, diarrhea, hematochezia, melena.  She denies any focal extremity weakness or visual loss. She denies any worsening lower extremity edema. In the ED, the patient was afebrile with soft blood pressures in the 90s.  WBC 11.8, hemoglobin 10.2, platelets 222,000. BMP showed sodium 134, potassium 4.1, bicarbonate 26, BUN 20, creatinine 0.74.  LFTs unremarkable.  BNP 274.  CTA chest was negative for PE, negative dissection.  There is diffuse fibrotic changes with honeycombing.  There is a right upper lobe 4 mm nodule.  CTA abdomen pelvis was negative for any acute findings.  EKG shows sinus rhythm with incomplete right bundle branch block.  Troponin 11>>   Assessment/Plan: Acute on chronic respiratory failure with hypoxia -Secondary to exacerbation of pulmonary fibrosis/COPD. -Suspect underlying viral infection -Chronically on 6 L nasal cannula at home -On 15 L high flow>>wean for saturation 88-95%>>11-12L -05/09/2022 CTA chest negative for PE, consolidation -Continue IV Solu-Medrol, dose adjusted with intention to start tapering. -continue Pulmicort, Maretta Bees and Brovana -COVID-19 PCR negative, RSV negative, influenza negative -check PCT <0.10 -viral resp panel--neg 2/27 AM--had episode respiratory distress with  CP --repeat CXR --continue morphine IV 1 mg for episodes of distress -- Continue as needed Hycodan --2/25 Echo--EF 60-65%, no WMA, normal RVF, trivial MR --check troponins, bmp, cbc. -Patient-overall condition guarded and poor.  Per her reports outpatient pulmonologist and cardiologist has expressed reaching terminal illness and she used to be on home hospice. -Appreciate assistance and recommendation by pulmonology service. -ESR has been checked and is only 2 currently. -Continue to follow clinical response. -Overall long-term prognosis is poor and guarded.   COPD exacerbation -continue IV solumedrol at current dose. -continue Yupelri, brovana, pulmicort -Continue to wean off oxygen supplementation as tolerated. -Outpatient follow-up pulmonology service will be arranged -Oxygen saturation goal 88 to 92%.  Acute on chronic sinusitis -After discussing with pulmonology service will provide treatment of any underlying infection. -Loratadine has been added to assist with congestion -Doxycycline x 7 days will be provided; patient allergic to penicillin.  (Day 2 out of 7).   Anxiety disorder -Continue home dose alprazolam, Zoloft -continue ativan prn   Opioid dependence -PDMP reviewed--patient receives morphine ER 15 mg, 90 monthly -Patient receives Dilaudid 4 mg, #120 monthly -Patient has been continue on extended release morphine every 12 hours, as needed Dilaudid and also IV morphine for breakthrough.   Hypothyroidism -Continue Synthroid   Diabetes mellitus type 2 -Appears to be managed by lifestyle modification -NovoLog sliding scale -Checked A1c--5.3 -CBGs controlled for the most part.  Continue to follow trend.   Mixed hyperlipidemia -continue statin.   [place] -Patient has expressed wanting full scope treatment practice and resuscitation if required -Appreciate assistance and recommendation by palliative care.   Subjective: Afebrile, no nausea or vomiting currently  (patient reports intermittently present  chronic).  Experiencing intermittent mildly productive coughing spells, show when the sensation with activity and is requiring 9 liters high flow nasal cannula supplementation.  Continue to experience intermittent pleuritic chest pain.  Objective: Vitals:   05/21/22 0806 05/21/22 0815 05/21/22 1113 05/21/22 1643  BP:      Pulse:      Resp:      Temp:  97.6 F (36.4 C) 98.7 F (37.1 C) (!) 97.4 F (36.3 C)  TempSrc:  Oral Oral Oral  SpO2: 100% 97%    Weight:      Height:        Intake/Output Summary (Last 24 hours) at 05/21/2022 1707 Last data filed at 05/21/2022 1313 Gross per 24 hour  Intake 555 ml  Output 725 ml  Net -170 ml   Weight change: 1 kg  Exam: General exam: Alert, awake, oriented x 3; ported intermittent pleuritic chest discomfort; short winded sensation and tachypnea.  9 L high flow nasal cannula in place.  Patient has not required the use of BiPAP. Respiratory system: Positive rhonchi; reported intermittent mildly productive coughing spells.  No using accessory muscles at time of examination.  Positive scattered wheezing. Cardiovascular system: Rate controlled; no rubs, no gallops, no JVD. Gastrointestinal system: Abdomen is nondistended, soft and nontender. No organomegaly or masses felt. Normal bowel sounds heard. Central nervous system: Alert and oriented. No focal neurological deficits. Extremities: No cyanosis; severe clubbing appreciated on exam. Skin: No petechiae. Psychiatry: Judgement and insight appear normal.  Flat affect appreciated.  Data Reviewed: I have personally reviewed following labs and imaging studies  Basic Metabolic Panel: Recent Labs  Lab 05/15/22 0851 05/21/22 0318  NA 133* 133*  K 4.3 3.9  CL 93* 94*  CO2 34* 30  GLUCOSE 108* 156*  BUN 14 14  CREATININE 0.50 0.55  CALCIUM 8.6* 8.5*  MG 1.8  --   PHOS 3.8  --    CBC: Recent Labs  Lab 05/15/22 0851 05/21/22 0318  WBC 10.4 20.0*  HGB  10.6* 10.2*  HCT 33.9* 31.5*  MCV 90.2 88.7  PLT 227 178   CBG: Recent Labs  Lab 05/20/22 1621 05/20/22 2103 05/21/22 0735 05/21/22 1116 05/21/22 1636  GLUCAP 197* 217* 110* 188* 145*   Urine analysis:    Component Value Date/Time   COLORURINE YELLOW 05/10/2022 1550   APPEARANCEUR HAZY (A) 05/10/2022 1550   LABSPEC >1.046 (H) 05/10/2022 1550   PHURINE 5.0 05/10/2022 1550   GLUCOSEU NEGATIVE 05/10/2022 1550   HGBUR NEGATIVE 05/10/2022 1550   HGBUR negative 05/17/2006 0000   BILIRUBINUR NEGATIVE 05/10/2022 1550   BILIRUBINUR small 03/30/2015 1546   KETONESUR 5 (A) 05/10/2022 1550   PROTEINUR 30 (A) 05/10/2022 1550   UROBILINOGEN 0.2 03/30/2015 1546   UROBILINOGEN 0.2 11/08/2013 0237   NITRITE NEGATIVE 05/10/2022 1550   LEUKOCYTESUR NEGATIVE 05/10/2022 1550   Sepsis Labs:  No results found for this or any previous visit (from the past 240 hour(s)).    Scheduled Meds:  ALPRAZolam  0.5 mg Oral TID   arformoterol  15 mcg Nebulization BID   aspirin EC  81 mg Oral Daily   budesonide (PULMICORT) nebulizer solution  0.5 mg Nebulization BID   Chlorhexidine Gluconate Cloth  6 each Topical Q0600   clotrimazole  10 mg Oral 5 X Daily   docusate sodium  200 mg Oral BID   doxycycline  100 mg Oral Q12H   fluticasone  2 spray Each Nare Daily   guaiFENesin  1,200 mg Oral  BID   insulin aspart  0-6 Units Subcutaneous TID WC   ipratropium  2 spray Each Nare QID   levothyroxine  175 mcg Oral Q0600   loratadine  10 mg Oral Daily   methylPREDNISolone (SOLU-MEDROL) injection  40 mg Intravenous Q12H   morphine  15 mg Oral Q12H   mouth rinse  15 mL Mouth Rinse 4 times per day   pantoprazole  40 mg Oral BID AC   revefenacin  175 mcg Nebulization Daily   rosuvastatin  20 mg Oral Daily   senna-docusate  1 tablet Oral QHS   sertraline  100 mg Oral QHS   sodium chloride  2 spray Each Nare BID   Continuous Infusions:  Procedures/Studies: CT MAXILLOFACIAL  LTD WO CM  Result Date:  05/19/2022 CLINICAL DATA:  Initial evaluation for cough. EXAM: CT PARANASAL SINUS LIMITED WITHOUT CONTRAST TECHNIQUE: Multidetector CT images of the paranasal sinuses were obtained using the standard protocol without intravenous contrast. RADIATION DOSE REDUCTION: This exam was performed according to the departmental dose-optimization program which includes automated exposure control, adjustment of the mA and/or kV according to patient size and/or use of iterative reconstruction technique. COMPARISON:  Prior head CT from 05/09/2022. FINDINGS: Paranasal sinuses: Frontal: Normally aerated. Patent frontal sinus drainage pathways. Ethmoid: Probable changes of prior partial ethmoidectomy. Mild-to-moderate mucoperiosteal thickening about the residual ethmoidal air cells, chronic in appearance. Maxillary: Mild chronic mucoperiosteal thickening present about the maxillary sinuses bilaterally, slightly worse on the right. Sphenoid: Trace layering fluid with pneumatized secretions present within the sphenoid sinuses. Left sphenoid sinus dominant. Right ostiomeatal unit: Patent with sequelae of prior antrostomy with uncinectomy. Left ostiomeatal unit: Patent with sequelae of prior antrostomy with uncinectomy. Nasal passages: Postoperative changes from prior bilateral turbinectomy. Nasal passages are clear. No more than trace 2 mm left-to-right nasal septal deviation. Anatomy: No pneumatization superior to anterior ethmoid notches. Symmetric and intact olfactory grooves and fovea ethmoidalis, Keros II (4-67m). Presellar sphenoid pneumatization pattern. No dehiscence of carotid or optic canals. No onodi cell. Other: Mastoid air cells and middle ear cavities are well pneumatized and free of fluid. Visualized intracranial contents demonstrate no significant finding. Scattered vascular calcifications noted within the carotid siphons. Prior bilateral ocular lens replacement. Globes and orbital soft tissues demonstrate no acute  finding. Remainder of the visualized soft tissues of the face are within normal limits. IMPRESSION: Sequelae of prior endoscopic sinus surgery with mild to moderate chronic mucoperiosteal thickening about the residual ethmoidal air cells and maxillary sinuses. Trace layering fluid with pneumatized secretions within the sphenoid sinuses, suggesting acute on chronic sinus disease. Electronically Signed   By: BJeannine BogaM.D.   On: 05/19/2022 02:50   DG CHEST PORT 1 VIEW  Result Date: 05/15/2022 CLINICAL DATA:  Respiratory distress EXAM: PORTABLE CHEST 1 VIEW COMPARISON:  05/13/2022.  CTA chest of 05/09/2022 FINDINGS: Midline trachea. Normal heart size. No pleural effusion or pneumothorax. Underlying basilar and peripheral predominant accentuated pulmonary interstitium, consistent with pulmonary fibrosis. No convincing evidence of acute superimposed lobar consolidation. IMPRESSION: Severe interstitial lung disease. This limits sensitivity of plain films. No convincing evidence of acute superimposed process. Electronically Signed   By: KAbigail MiyamotoM.D.   On: 05/15/2022 08:50   ECHOCARDIOGRAM COMPLETE  Result Date: 05/13/2022    ECHOCARDIOGRAM REPORT   Patient Name:   SANETRIA CONDRADate of Exam: 05/13/2022 Medical Rec #:  [id number]          Height:       62.0 in Accession #:  [id number]          Weight:       135.1 lb Date of Birth:  11/30/1955           BSA:          1.618 m Patient Age:    45 years            BP:           116/52 mmHg Patient Gender: F                   HR:           60 bpm. Exam Location:  [facility] Procedure: 2D Echo, Cardiac Doppler and Color Doppler Indications:    R07.9* Chest pain, unspecified; R06.02 SOB  History:        Patient has no prior history of Echocardiogram examinations.                 Abnormal ECG, COPD, Arrythmias:Tachycardia;                 Signs/Symptoms:Shortness of Breath, Dyspnea and Chest Pain.                 Pulmonary fibrosis.  Sonographer:     Roseanna Rainbow RDCS Referring Phys: [phone] DAVID TAT  Sonographer Comments: Technically difficult study due to poor echo windows. Chest wall deformity. IMPRESSIONS  1. Left ventricular ejection fraction, by estimation, is 60 to 65%. The left ventricle has normal function. The left ventricle has no regional wall motion abnormalities. There is mild asymmetric left ventricular hypertrophy of the basal-septal segment. Left ventricular diastolic parameters are indeterminate. Elevated left atrial pressure.  2. Right ventricular systolic function is normal. The right ventricular size is normal. Tricuspid regurgitation signal is inadequate for assessing PA pressure.  3. Left atrial size was severely dilated.  4. The mitral valve is normal in structure. Trivial mitral valve regurgitation. No evidence of mitral stenosis.  5. The aortic valve is tricuspid. Aortic valve regurgitation is not visualized.  6. The inferior vena cava is dilated in size with >50% respiratory variability, suggesting right atrial pressure of 8 mmHg. Comparison(s): No prior Echocardiogram. FINDINGS  Left Ventricle: Left ventricular ejection fraction, by estimation, is 60 to 65%. The left ventricle has normal function. The left ventricle has no regional wall motion abnormalities. The left ventricular internal cavity size was small. There is mild asymmetric left ventricular hypertrophy of the basal-septal segment. Left ventricular diastolic parameters are indeterminate. Elevated left atrial pressure. Right Ventricle: The right ventricular size is normal. No increase in right ventricular wall thickness. Right ventricular systolic function is normal. Tricuspid regurgitation signal is inadequate for assessing PA pressure. Left Atrium: Left atrial size was severely dilated. Right Atrium: Right atrial size was normal in size. Pericardium: There is no evidence of pericardial effusion. Mitral Valve: The mitral valve is normal in structure. Trivial mitral valve  regurgitation. No evidence of mitral valve stenosis. Tricuspid Valve: The tricuspid valve is normal in structure. Tricuspid valve regurgitation is not demonstrated. No evidence of tricuspid stenosis. Aortic Valve: The aortic valve is tricuspid. Aortic valve regurgitation is not visualized. Pulmonic Valve: The pulmonic valve was not well visualized. Pulmonic valve regurgitation is not visualized. No evidence of pulmonic stenosis. Aorta: The aortic root and ascending aorta are structurally normal, with no evidence of dilitation. Venous: The inferior vena cava is dilated in size with greater than 50% respiratory variability, suggesting right atrial pressure of 8 mmHg. IAS/Shunts:  The atrial septum is grossly normal.  LEFT VENTRICLE PLAX 2D LVIDd:         3.10 cm     Diastology LVIDs:         2.10 cm     LV e' medial:    6.53 cm/s LV PW:         1.00 cm     LV E/e' medial:  12.1 LV IVS:        1.30 cm     LV e' lateral:   8.05 cm/s LVOT diam:     2.20 cm     LV E/e' lateral: 9.9 LV SV:         129 LV SV Index:   80 LVOT Area:     3.80 cm  LV Volumes (MOD) LV vol d, MOD A2C: 75.6 ml LV vol d, MOD A4C: 82.1 ml LV vol s, MOD A2C: 25.9 ml LV vol s, MOD A4C: 29.6 ml LV SV MOD A2C:     49.7 ml LV SV MOD A4C:     82.1 ml LV SV MOD BP:      50.7 ml RIGHT VENTRICLE             IVC RV S prime:     15.80 cm/s  IVC diam: 2.20 cm TAPSE (M-mode): 1.4 cm LEFT ATRIUM             Index        RIGHT ATRIUM           Index LA diam:        3.90 cm 2.41 cm/m   RA Area:     13.00 cm LA Vol (A2C):   97.3 ml 60.12 ml/m  RA Volume:   25.30 ml  15.63 ml/m LA Vol (A4C):   80.3 ml 49.62 ml/m LA Biplane Vol: 93.7 ml 57.90 ml/m  AORTIC VALVE LVOT Vmax:   147.00 cm/s LVOT Vmean:  98.800 cm/s LVOT VTI:    0.340 m  AORTA Ao Root diam: 3.50 cm Ao Asc diam:  3.20 cm MITRAL VALVE MV Area (PHT): 5.38 cm    SHUNTS MV Decel Time: 141 msec    Systemic VTI:  0.34 m MV E velocity: 79.30 cm/s  Systemic Diam: 2.20 cm MV A velocity: 56.90 cm/s MV E/A  ratio:  1.39 Rudean Haskell MD Electronically signed by Rudean Haskell MD Signature Date/Time: 05/13/2022/2:18:19 PM    Final    DG CHEST PORT 1 VIEW  Result Date: 05/13/2022 CLINICAL DATA:  67 year old female presents with respiratory distress. EXAM: PORTABLE CHEST 1 VIEW COMPARISON:  May 09, 2022 FINDINGS: EKG leads project over the chest. Cardiomediastinal contours and hilar structures are normal. Diffuse interstitial and airspace opacities are similar with slightly diminished lung volumes. No pneumothorax. On limited assessment no acute skeletal findings. IMPRESSION: Similar appearance of the chest accounting for slightly diminished lung volumes with findings of chronic interstitial lung disease. With this pattern it would be difficult to exclude superimposed infection or edema. Electronically Signed   By: Zetta Bills M.D.   On: 05/13/2022 12:32   CT Angio Chest/Abd/Pel for Dissection W and/or Wo Contrast  Result Date: 05/09/2022 CLINICAL DATA:  Chest pain radiating into the neck, initial encounter EXAM: CT ANGIOGRAPHY CHEST, ABDOMEN AND PELVIS TECHNIQUE: Non-contrast CT of the chest was initially obtained. Multidetector CT imaging through the chest, abdomen and pelvis was performed using the standard protocol during bolus administration of intravenous contrast. Multiplanar reconstructed images and MIPs were  obtained and reviewed to evaluate the vascular anatomy. RADIATION DOSE REDUCTION: This exam was performed according to the departmental dose-optimization program which includes automated exposure control, adjustment of the mA and/or kV according to patient size and/or use of iterative reconstruction technique. CONTRAST:  128m OMNIPAQUE IOHEXOL 350 MG/ML SOLN COMPARISON:  07/15/2020 FINDINGS: CTA CHEST FINDINGS Cardiovascular: Initial precontrast images demonstrate atherosclerotic calcification of the thoracic aorta. No aneurysmal dilatation is seen. No hyperdense crescent to  suggest acute aortic injury is noted. Postcontrast images were then obtained and reveal no evidence of aortic dissection. Heart is not significantly enlarged. The pulmonary artery shows a normal branching pattern bilaterally. No filling defect to suggest pulmonary embolism is noted. Coronary calcifications are noted. Mediastinum/Nodes: Thoracic inlet is within normal limits. Prominent AP window lymph node has resolved in the interval from the prior exam. Persistent right hilar lymph nodes are seen likely reactive in nature. The esophagus as visualized is within normal limits. Lungs/Pleura: Lungs demonstrate diffuse fibrotic changes and honeycombing particularly in the bases. The previously seen diffuse superimposed infiltrates have resolved. There is a 4 mm nodule in the right upper lobe on image number 26 of series 9. No other sizable nodules are seen. No effusions are noted. Musculoskeletal: Degenerative changes of the thoracic spine are noted. No acute rib abnormality is seen. Chronic compression deformities of T5 and T7 are noted. Review of the MIP images confirms the above findings. CTA ABDOMEN AND PELVIS FINDINGS VASCULAR Aorta: Atherosclerotic calcifications are noted without aneurysmal dilatation or dissection. Celiac: Patent without evidence of aneurysm, dissection, vasculitis or significant stenosis. SMA: Patent without evidence of aneurysm, dissection, vasculitis or significant stenosis. Renals: Both renal arteries are patent without evidence of aneurysm, dissection, vasculitis, fibromuscular dysplasia or significant stenosis. IMA: Patent without evidence of aneurysm, dissection, vasculitis or significant stenosis. Inflow: Iliacs demonstrate mild atherosclerotic calcification although no focal stenosis is seen. Veins: Visualized venous structures appear within normal limits. IVC filter is noted in satisfactory position. Review of the MIP images confirms the above findings. NON-VASCULAR Hepatobiliary:  Gallbladder has been surgically removed. Liver shows no focal abnormality. Pancreas: Unremarkable. No pancreatic ductal dilatation or surrounding inflammatory changes. Spleen: Normal in size without focal abnormality. Adrenals/Urinary Tract: Adrenal glands are within normal limits. Kidneys demonstrate a normal enhancement pattern bilaterally. No renal calculi or obstructive changes are seen. The bladder is partially distended. Stomach/Bowel: Fecal material is noted scattered throughout the colon. No obstructive or inflammatory changes are seen. The appendix is not well visualized and may have been surgically. No inflammatory changes are seen. The small bowel and stomach are unremarkable. Lymphatic: No lymphadenopathy is noted. Reproductive: Status post hysterectomy. No adnexal masses. Other: No abdominal wall hernia or abnormality. No abdominopelvic ascites. Musculoskeletal: Postsurgical changes are noted in the lower lumbar spine. Prior vertebral augmentation at L1 is seen. Chronic L3 compression fracture is noted. Review of the MIP images confirms the above findings. IMPRESSION: CTA of the chest: No evidence of aortic injury. No pulmonary embolism is seen. Persistent right hilar lymph nodes likely reactive in nature. Chronic fibrotic changes are noted in the lungs bilaterally similar to prior exam. 4 mm right solid pulmonary nodule within the upper lobe. Per Fleischner Society Guidelines, a non-contrast Chest CT at 12 months is optional. If performed and the nodule is stable at 12 months, no further follow-up is recommended. These guidelines do not apply to immunocompromised patients and patients with cancer. Follow up in patients with significant comorbidities as clinically warranted. For lung cancer screening, adhere to  Lung-RADS guidelines. Reference: Radiology. 2017; 284(1):228-43. CTA of the abdomen and pelvis: No aortic abnormality is noted. No acute abnormality is seen. Electronically Signed   By: Inez Catalina M.D.   On: 05/09/2022 23:40   CT Head Wo Contrast  Result Date: 05/09/2022 CLINICAL DATA:  New onset headache EXAM: CT HEAD WITHOUT CONTRAST TECHNIQUE: Contiguous axial images were obtained from the base of the skull through the vertex without intravenous contrast. RADIATION DOSE REDUCTION: This exam was performed according to the departmental dose-optimization program which includes automated exposure control, adjustment of the mA and/or kV according to patient size and/or use of iterative reconstruction technique. COMPARISON:  None Available. FINDINGS: Brain: There is no mass, hemorrhage or extra-axial collection. The size and configuration of the ventricles and extra-axial CSF spaces are normal. The brain parenchyma is normal, without acute or chronic infarction. Vascular: No abnormal hyperdensity of the major intracranial arteries or dural venous sinuses. No intracranial atherosclerosis. Skull: The visualized skull base, calvarium and extracranial soft tissues are normal. Sinuses/Orbits: No fluid levels or advanced mucosal thickening of the visualized paranasal sinuses. No mastoid or middle ear effusion. The orbits are normal. IMPRESSION: Normal head CT. Electronically Signed   By: Ulyses Jarred M.D.   On: 05/09/2022 23:22   DG Chest Portable 1 View  Result Date: 05/09/2022 CLINICAL DATA:  Chest pain and shortness of breath EXAM: PORTABLE CHEST 1 VIEW COMPARISON:  07/15/2020 FINDINGS: Cardiac shadow is stable. Diffuse fibrotic changes are identified throughout both lungs. No focal infiltrate or sizable effusion is seen. No bony abnormality is noted. IMPRESSION: Changes consistent with pulmonary fibrosis without acute abnormality. Electronically Signed   By: Inez Catalina M.D.   On: 05/09/2022 22:03    Barton Dubois, MD  [facility]  If 7PM-7AM, please contact night-coverage [email or web address] Password [email or web address] 05/21/2022, 5:07 PM   LOS: 11 days

## 2022-05-21 NOTE — Progress Notes (Signed)
NAME:  [name] Fitzpatrick, MRN:  [id number], DOB:  [birth year], LOS: 85 ADMISSION DATE:  05/09/2022, CONSULTATION DATE:  05/10/2022 REFERRING MD:  Dr. 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  Six days prior to admission she had fever 102F, sinus pressure and headache, and nose bleed.  SpO2 was 88% on her baseline oxygen of 6 liters.  She was started on Bipap.  PCCM consulted to assist with respiratory management.  She was hospitalized in oct 2020 for pneumonia with ARDS & DVT.  She required intubation and tracheostomy.  She developed pulmonary fibrosis after this.  Of note, chest x-ray in January 2022 shows mild interstitial thickening she normally uses 8 liters oxygen at home.  Her activity level is severely limited due to her breathing.  She was followed by [facility] chest, was on hospice for 1 year until she self discharged. RHC could not be performed via femoral route due to IVC filter.  Maintained on Pulmicort, Anoro and Singulair  Pertinent  Medical History  DVT s/p IVC filter, ARDS, COPD, Depression, DM type 2, Fibromyalgia, GERD, Hashimoto's thyroiditis, HTN, IBS, Migraine headache, Seizure, Chronic pain, Atrial fibrillation  Significant Hospital Events: Including procedures, antibiotic start and stop dates in addition to other pertinent events   2/21 Admit  Scheduled Meds:  ALPRAZolam  0.5 mg Oral TID   arformoterol  15 mcg Nebulization BID   aspirin EC  81 mg Oral Daily   budesonide (PULMICORT) nebulizer solution  0.5 mg Nebulization BID   Chlorhexidine Gluconate Cloth  6 each Topical Q0600   clotrimazole  10 mg Oral 5 X Daily   docusate sodium  200 mg Oral BID   doxycycline  100 mg Oral Q12H   fluticasone  2 spray Each Nare Daily   guaiFENesin  1,200 mg  Oral BID   insulin aspart  0-6 Units Subcutaneous TID WC   ipratropium  2 spray Each Nare QID   levothyroxine  175 mcg Oral Q0600   loratadine  10 mg Oral Daily   methylPREDNISolone (SOLU-MEDROL) injection  80 mg Intravenous Q12H   morphine  15 mg Oral Q12H   mouth rinse  15 mL Mouth Rinse 4 times per day   pantoprazole  40 mg Oral BID AC   revefenacin  175 mcg Nebulization Daily   rosuvastatin  20 mg Oral Daily   senna-docusate  1 tablet Oral QHS   sertraline  100 mg Oral QHS   sodium chloride  2 spray Each Nare BID   Continuous Infusions: PRN Meds:.acetaminophen **OR** acetaminophen, albuterol, HYDROcodone bit-homatropine, HYDROmorphone, LORazepam, melatonin, mineral oil, morphine injection, nitroGLYCERIN, ondansetron (ZOFRAN) IV, mouth rinse, phenol, polyethylene glycol, prochlorperazine, traZODone    Tests:  PFT 02/26/19 >> FEV1 1.15 (51%), FEV1% 81, DLCO 27% PFT 03/01/21 >> FEV1 1.28 (58%), FEV1% 81 Echo 07/20/21 >> EF 60 to 65%, mild LVH LHC 08/03/21 >> normal coronary angiogram CT angio chest 05/09/22 >> diffuse fibrotic changes and honeycombing more at bases, 4 mm nodule RUL CT sinus 3/1  piror sinus surgery with a/f levels c/w acute and chronic sinusitis  RHc and LHC 10/07/18 Normal coronary arteries, Normal cardiac output, Mild Pulmonary HTN  CT sinuses -trace layering fluid, mild acute on chronic sinus disease  Interim History /  Subjective:   Breathing better Coughing up sticky white to yellow sputum Afebrile Remains on 8 L nasal cannula -saturation documented as 97% Objective   Blood pressure (!) 101/57, pulse 73, temperature 98.7 F (37.1 C), temperature source Oral, resp. rate (!) 21, height '5\' 2"'$  (1.575 m), weight 59.4 kg, SpO2 97 %.        Intake/Output Summary (Last 24 hours) at 05/21/2022 1428 Last data filed at 05/21/2022 1313 Gross per 24 hour  Intake 555 ml  Output 725 ml  Net -170 ml    Filed Weights   05/19/22 0500 05/20/22 0500 05/21/22 0500  Weight:  59.1 kg 58.4 kg 59.4 kg    Examination:  General appearance:    elderly wf/chronically ill-appearing, out of bed to chair  No jvd Oropharynx clear,  mucosa nl Neck supple Lungs with scattered bilateral rhonchi, right lower lobe crackles, no accessory muscle use RRR no s3 or or sign murmur Abd soft / nl  excursion  Extr warm with no edema  - severe clubbing noted Neuro  Sensorium intact ,  no apparent motor deficits   Labs show mild hyponatremia, leukocytosis, stable anemia I personally reviewed images and agree with radiology impression as follows:  CXR:   portable 05/15/22  Bilateral interstitial infiltrates as prior, no effusions or pneumonia  I have reviewed [facility] chest last office note, discharge note in 2020 from [facility]  Assessment & Plan:   Acute on chronic hypoxic/hypercapnic respiratory failure likely from acute viral respiratory infection in setting of severe pulmonary fibrosis and COPD exacerbation. 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 Will need further evaluation with high-resolution CT chest in the future - goal SpO2 88 to 92%no need for higher, hopefully with this goal we should be able to decrease oxygen requirements to 8 L or lower which is her home requirement - continue yupelri, brovana, pulmicort - prn albuterol - drop solumedrol 40 mg bid  - mucinex, flutter valve - max gerd rx  -May be candidate for pulmonary rehab as outpatient  Rhinitis/ ? Sinusitis  -Doxycycline    Atrial fibrillation with PFO. - previously followed by cardiology at [facility]. Chronic pain. Hypothyroidism. DM type 2 poorly controlled with steroid induced hyperglycemia. Epistaxis. - per primary team  Goals of care: Full scope of care.  She has 2 grandchildren and she would like to live a full life.  We will refer her to pulmonary rehab as outpatient.  Doubt she would be a great transplant candidate but will  reestablish pulmonary care on discharge.  She would like to relocate from [facility] chest daughter practice in [place] (right click and "Reselect all SmartList Selections" daily)   Diet/type: Regular consistency (see orders) DVT prophylaxis: SCD GI prophylaxis: PPI Lines: N/A Foley:  N/A Code Status:  full code Last date of multidisciplinary goals of care discussion '[x]'$   Labs       Latest Ref Rng & Units 05/21/2022    3:18 AM 05/15/2022    8:51 AM 05/13/2022   12:45 PM  CMP  Glucose 70 - 99 mg/dL 156  108  160   BUN 8 - 23 mg/dL '14  14  16   '$ Creatinine 0.44 - 1.00 mg/dL 0.55  0.50  0.55   Sodium 135 - 145 mmol/L 133  133  134   Potassium 3.5 - 5.1 mmol/L 3.9  4.3  3.9   Chloride 98 -  111 mmol/L 94  93  97   CO2 22 - 32 mmol/L 30  34  29   Calcium 8.9 - 10.3 mg/dL 8.5  8.6  8.6   Total Protein 6.5 - 8.1 g/dL   5.7   Total Bilirubin 0.3 - 1.2 mg/dL   0.4   Alkaline Phos 38 - 126 U/L   46   AST 15 - 41 U/L   16   ALT 0 - 44 U/L   15        Latest Ref Rng & Units 05/21/2022    3:18 AM 05/15/2022    8:51 AM 05/13/2022   12:45 PM  CBC  WBC 4.0 - 10.5 K/uL 20.0  10.4  10.5   Hemoglobin 12.0 - 15.0 g/dL 10.2  10.6  9.9   Hematocrit 36.0 - 46.0 % 31.5  33.9  31.6   Platelets 150 - 400 K/uL 178  227  207     ABG    Component Value Date/Time   PHART 7.38 05/13/2022 1215   PCO2ART 61 (H) 05/13/2022 1215   PO2ART 145 (H) 05/13/2022 1215   HCO3 36.1 (H) 05/13/2022 1215   TCO2 32 05/25/2020 1315   ACIDBASEDEF 0.1 01/30/2010 2033   O2SAT 99.9 05/13/2022 1215    CBG (last 3)  Recent Labs    05/20/22 2103 05/21/22 0735 05/21/22 1116  GLUCAP 217* 110* 188*        Lab Results  Component Value Date   ESRSEDRATE 4 05/18/2022    Lab Results  Component Value Date   TSH 0.445 05/09/2022    Kara Mead MD. FCCP. [facility] Pager : [phone]  If no response to pager , please call [phone] until 7 pm After 7:00 pm call Elink   [phone]   05/21/2022

## 2022-05-22 LAB — GLUCOSE, CAPILLARY
Glucose-Capillary: 82 mg/dL (ref 70–99)
Glucose-Capillary: 136 mg/dL — ABNORMAL HIGH (ref 70–99)
Glucose-Capillary: 133 mg/dL — ABNORMAL HIGH (ref 70–99)
Glucose-Capillary: 188 mg/dL — ABNORMAL HIGH (ref 70–99)

## 2022-05-22 NOTE — Plan of Care (Signed)

## 2022-05-22 NOTE — Progress Notes (Signed)
PROGRESS NOTE  [name] Fitzpatrick [id number] DOB: 10-11-1955 DOA: 05/09/2022 PCP: Joya Gaskins, FNP  Brief History:  67 year old female with a history of chronic respiratory failure on 6 L, pulmonary fibrosis, diabetes mellitus type 2, anemia of chronic disease, anxiety, hypertension presenting with worsening cough and shortness of breath over the last 2 to 3 days.  The patient states that she has been having some headaches which began on 05/04/2022 followed by some emesis.  She states that her emesis has improved, unfortunately, her shortness of breath and coughing have worsened.  She has had some fevers at home up to 101.2 F.  She denies any hemoptysis, abdominal pain, diarrhea, hematochezia, melena.  She denies any focal extremity weakness or visual loss. She denies any worsening lower extremity edema. In the ED, the patient was afebrile with soft blood pressures in the 90s.  WBC 11.8, hemoglobin 10.2, platelets 222,000. BMP showed sodium 134, potassium 4.1, bicarbonate 26, BUN 20, creatinine 0.74.  LFTs unremarkable.  BNP 274.  CTA chest was negative for PE, negative dissection.  There is diffuse fibrotic changes with honeycombing.  There is a right upper lobe 4 mm nodule.  CTA abdomen pelvis was negative for any acute findings.  EKG shows sinus rhythm with incomplete right bundle branch block.  Troponin 11>>   Assessment/Plan: Acute on chronic respiratory failure with hypoxia -Secondary to exacerbation of pulmonary fibrosis/COPD. -Suspect underlying viral infection -Chronically on 6 L nasal cannula at home -On 15 L high flow>>wean for saturation 88-95%>>11-12L -05/09/2022 CTA chest negative for PE, consolidation -Continue IV Solu-Medrol, dose adjusted with intention to start tapering. -continue Pulmicort, Maretta Bees and Brovana -COVID-19 PCR negative, RSV negative, influenza negative -check PCT <0.10 -viral resp panel--neg 2/27 AM--had episode respiratory distress with  CP --repeat CXR --continue morphine IV 1 mg for episodes of distress -- Continue as needed Hycodan --2/25 Echo--EF 60-65%, no WMA, normal RVF, trivial MR --check troponins, bmp, cbc. -Patient-overall condition guarded and poor.  Per her reports outpatient pulmonologist and cardiologist has expressed reaching terminal illness and she used to be on home hospice. -Appreciate assistance and recommendation by pulmonology service. -ESR has been checked and is only 2 currently. -Continue to follow clinical response. -Overall long-term prognosis is poor and guarded. -Outpatient pulmonary rehab will be arranged.   COPD exacerbation -continue IV solumedrol at current dose. -continue Yupelri, brovana, pulmicort -Continue to wean off oxygen supplementation as tolerated. -Outpatient follow-up pulmonology service will be arranged -Oxygen saturation goal 88 to 92%. -currently 7-8 HFN supplementation, normally uses 6L -Continue steroid tapering and follow pulmonology service recommendation.  Acute on chronic sinusitis -After discussing with pulmonology service will provide treatment of any underlying infection. -Loratadine has been added to assist with congestion -Doxycycline x 7 days will be provided; patient allergic to penicillin.  (Day 2 out of 7).   Anxiety disorder -Continue home dose alprazolam, Zoloft -continue ativan prn -continue supportive care   Opioid dependence -PDMP reviewed--patient receives morphine ER 15 mg, 90 monthly -Patient receives Dilaudid 4 mg, #120 monthly -Patient has been continue on extended release morphine every 12 hours, as needed Dilaudid and also IV morphine for breakthrough.   Hypothyroidism -Continue Synthroid   Diabetes mellitus type 2 -Appears to be managed by lifestyle modification -NovoLog sliding scale -Checked A1c--5.3 -CBGs controlled for the most part.  Continue to follow trend.   Mixed hyperlipidemia -continue statin.   [place] -Patient has  expressed wanting full scope treatment practice  and resuscitation if required -Appreciate assistance and recommendation by palliative care.   Subjective: No fever, no nausea or vomiting on today's examination; reports feeling better and her oxygen supplementation has drop to 7-8 L high flow nasal cannula.  Continue to experience intermittent episodes of pleuritic chest pain and 2 days has noticed blood tinged sputum during coughing spells.  Objective: Vitals:   05/22/22 0901 05/22/22 0903 05/22/22 1000 05/22/22 1156  BP:   108/88   Pulse:   82   Resp:   (!) 24   Temp:    98.2 F (36.8 C)  TempSrc:    Oral  SpO2: 100% 100% 100%   Weight:      Height:        Intake/Output Summary (Last 24 hours) at 05/22/2022 1402 Last data filed at 05/22/2022 1309 Gross per 24 hour  Intake 960 ml  Output --  Net 960 ml   Weight change: 0 kg  Exam: General exam: Alert, awake, oriented x 3; reports feeling better; continues slowly improving.  Intermittent coughing spell with blood stains.  Appreciated.  No fever.  Down to 7-8 L high flow nasal cannula supplementation. Respiratory system: Positive Velcro sound on auscultation; scattered expiratory wheezing and positive rhonchi.  Mild tachypnea with minimal exertion intermittently appreciated.  Cardiovascular system: Rate controlled, sinus rhythm appreciated on telemetry evaluation.  No rubs, no gallops, no JVD. Gastrointestinal system: Abdomen is nondistended, soft and nontender. No organomegaly or masses felt. Normal bowel sounds heard. Central nervous system: Alert and oriented. No focal neurological deficits. Extremities: No cyanosis or clubbing. Skin: No petechiae. Psychiatry: Judgement and insight appear normal.  Flat affect.  Data Reviewed: I have personally reviewed following labs and imaging studies  Basic Metabolic Panel: Recent Labs  Lab 05/21/22 0318  NA 133*  K 3.9  CL 94*  CO2 30  GLUCOSE 156*  BUN 14  CREATININE 0.55   CALCIUM 8.5*   CBC: Recent Labs  Lab 05/21/22 0318  WBC 20.0*  HGB 10.2*  HCT 31.5*  MCV 88.7  PLT 178   CBG: Recent Labs  Lab 05/21/22 1116 05/21/22 1636 05/21/22 2219 05/22/22 0757 05/22/22 1151  GLUCAP 188* 145* 169* 82 136*   Urine analysis:    Component Value Date/Time   COLORURINE YELLOW 05/10/2022 1550   APPEARANCEUR HAZY (A) 05/10/2022 1550   LABSPEC >1.046 (H) 05/10/2022 1550   PHURINE 5.0 05/10/2022 1550   GLUCOSEU NEGATIVE 05/10/2022 1550   HGBUR NEGATIVE 05/10/2022 1550   HGBUR negative 05/17/2006 0000   BILIRUBINUR NEGATIVE 05/10/2022 1550   BILIRUBINUR small 03/30/2015 1546   KETONESUR 5 (A) 05/10/2022 1550   PROTEINUR 30 (A) 05/10/2022 1550   UROBILINOGEN 0.2 03/30/2015 1546   UROBILINOGEN 0.2 11/08/2013 0237   NITRITE NEGATIVE 05/10/2022 1550   LEUKOCYTESUR NEGATIVE 05/10/2022 1550   Sepsis Labs:  No results found for this or any previous visit (from the past 240 hour(s)).    Scheduled Meds:  ALPRAZolam  0.5 mg Oral TID   arformoterol  15 mcg Nebulization BID   aspirin EC  81 mg Oral Daily   budesonide (PULMICORT) nebulizer solution  0.5 mg Nebulization BID   Chlorhexidine Gluconate Cloth  6 each Topical Q0600   clotrimazole  10 mg Oral 5 X Daily   docusate sodium  200 mg Oral BID   doxycycline  100 mg Oral Q12H   fluticasone  2 spray Each Nare Daily   guaiFENesin  1,200 mg Oral BID   insulin aspart  0-6 Units Subcutaneous TID WC   ipratropium  2 spray Each Nare QID   levothyroxine  175 mcg Oral Q0600   loratadine  10 mg Oral Daily   methylPREDNISolone (SOLU-MEDROL) injection  40 mg Intravenous Q12H   morphine  15 mg Oral Q12H   mouth rinse  15 mL Mouth Rinse 4 times per day   pantoprazole  40 mg Oral BID AC   revefenacin  175 mcg Nebulization Daily   rosuvastatin  20 mg Oral Daily   senna-docusate  1 tablet Oral QHS   sertraline  100 mg Oral QHS   sodium chloride  2 spray Each Nare BID   Continuous  Infusions:  Procedures/Studies: CT MAXILLOFACIAL  LTD WO CM  Result Date: 05/19/2022 CLINICAL DATA:  Initial evaluation for cough. EXAM: CT PARANASAL SINUS LIMITED WITHOUT CONTRAST TECHNIQUE: Multidetector CT images of the paranasal sinuses were obtained using the standard protocol without intravenous contrast. RADIATION DOSE REDUCTION: This exam was performed according to the departmental dose-optimization program which includes automated exposure control, adjustment of the mA and/or kV according to patient size and/or use of iterative reconstruction technique. COMPARISON:  Prior head CT from 05/09/2022. FINDINGS: Paranasal sinuses: Frontal: Normally aerated. Patent frontal sinus drainage pathways. Ethmoid: Probable changes of prior partial ethmoidectomy. Mild-to-moderate mucoperiosteal thickening about the residual ethmoidal air cells, chronic in appearance. Maxillary: Mild chronic mucoperiosteal thickening present about the maxillary sinuses bilaterally, slightly worse on the right. Sphenoid: Trace layering fluid with pneumatized secretions present within the sphenoid sinuses. Left sphenoid sinus dominant. Right ostiomeatal unit: Patent with sequelae of prior antrostomy with uncinectomy. Left ostiomeatal unit: Patent with sequelae of prior antrostomy with uncinectomy. Nasal passages: Postoperative changes from prior bilateral turbinectomy. Nasal passages are clear. No more than trace 2 mm left-to-right nasal septal deviation. Anatomy: No pneumatization superior to anterior ethmoid notches. Symmetric and intact olfactory grooves and fovea ethmoidalis, Keros II (4-55m). Presellar sphenoid pneumatization pattern. No dehiscence of carotid or optic canals. No onodi cell. Other: Mastoid air cells and middle ear cavities are well pneumatized and free of fluid. Visualized intracranial contents demonstrate no significant finding. Scattered vascular calcifications noted within the carotid siphons. Prior bilateral  ocular lens replacement. Globes and orbital soft tissues demonstrate no acute finding. Remainder of the visualized soft tissues of the face are within normal limits. IMPRESSION: Sequelae of prior endoscopic sinus surgery with mild to moderate chronic mucoperiosteal thickening about the residual ethmoidal air cells and maxillary sinuses. Trace layering fluid with pneumatized secretions within the sphenoid sinuses, suggesting acute on chronic sinus disease. Electronically Signed   By: BJeannine BogaM.D.   On: 05/19/2022 02:50   DG CHEST PORT 1 VIEW  Result Date: 05/15/2022 CLINICAL DATA:  Respiratory distress EXAM: PORTABLE CHEST 1 VIEW COMPARISON:  05/13/2022.  CTA chest of 05/09/2022 FINDINGS: Midline trachea. Normal heart size. No pleural effusion or pneumothorax. Underlying basilar and peripheral predominant accentuated pulmonary interstitium, consistent with pulmonary fibrosis. No convincing evidence of acute superimposed lobar consolidation. IMPRESSION: Severe interstitial lung disease. This limits sensitivity of plain films. No convincing evidence of acute superimposed process. Electronically Signed   By: KAbigail MiyamotoM.D.   On: 05/15/2022 08:50   ECHOCARDIOGRAM COMPLETE  Result Date: 05/13/2022    ECHOCARDIOGRAM REPORT   Patient Name:   [name] SPINALEDate of Exam: 05/13/2022 Medical Rec #:  [id number]          Height:       62.0 in Accession #:    [id number]  Weight:       135.1 lb Date of Birth:  14-May-1955           BSA:          1.618 m Patient Age:    43 years            BP:           116/52 mmHg Patient Gender: F                   HR:           60 bpm. Exam Location:  [facility] Procedure: 2D Echo, Cardiac Doppler and Color Doppler Indications:    R07.9* Chest pain, unspecified; R06.02 SOB  History:        Patient has no prior history of Echocardiogram examinations.                 Abnormal ECG, COPD, Arrythmias:Tachycardia;                 Signs/Symptoms:Shortness of Breath,  Dyspnea and Chest Pain.                 Pulmonary fibrosis.  Sonographer:    Roseanna Rainbow RDCS Referring Phys: [phone] DAVID TAT  Sonographer Comments: Technically difficult study due to poor echo windows. Chest wall deformity. IMPRESSIONS  1. Left ventricular ejection fraction, by estimation, is 60 to 65%. The left ventricle has normal function. The left ventricle has no regional wall motion abnormalities. There is mild asymmetric left ventricular hypertrophy of the basal-septal segment. Left ventricular diastolic parameters are indeterminate. Elevated left atrial pressure.  2. Right ventricular systolic function is normal. The right ventricular size is normal. Tricuspid regurgitation signal is inadequate for assessing PA pressure.  3. Left atrial size was severely dilated.  4. The mitral valve is normal in structure. Trivial mitral valve regurgitation. No evidence of mitral stenosis.  5. The aortic valve is tricuspid. Aortic valve regurgitation is not visualized.  6. The inferior vena cava is dilated in size with >50% respiratory variability, suggesting right atrial pressure of 8 mmHg. Comparison(s): No prior Echocardiogram. FINDINGS  Left Ventricle: Left ventricular ejection fraction, by estimation, is 60 to 65%. The left ventricle has normal function. The left ventricle has no regional wall motion abnormalities. The left ventricular internal cavity size was small. There is mild asymmetric left ventricular hypertrophy of the basal-septal segment. Left ventricular diastolic parameters are indeterminate. Elevated left atrial pressure. Right Ventricle: The right ventricular size is normal. No increase in right ventricular wall thickness. Right ventricular systolic function is normal. Tricuspid regurgitation signal is inadequate for assessing PA pressure. Left Atrium: Left atrial size was severely dilated. Right Atrium: Right atrial size was normal in size. Pericardium: There is no evidence of pericardial effusion. Mitral  Valve: The mitral valve is normal in structure. Trivial mitral valve regurgitation. No evidence of mitral valve stenosis. Tricuspid Valve: The tricuspid valve is normal in structure. Tricuspid valve regurgitation is not demonstrated. No evidence of tricuspid stenosis. Aortic Valve: The aortic valve is tricuspid. Aortic valve regurgitation is not visualized. Pulmonic Valve: The pulmonic valve was not well visualized. Pulmonic valve regurgitation is not visualized. No evidence of pulmonic stenosis. Aorta: The aortic root and ascending aorta are structurally normal, with no evidence of dilitation. Venous: The inferior vena cava is dilated in size with greater than 50% respiratory variability, suggesting right atrial pressure of 8 mmHg. IAS/Shunts: The atrial septum is grossly normal.  LEFT VENTRICLE PLAX  2D LVIDd:         3.10 cm     Diastology LVIDs:         2.10 cm     LV e' medial:    6.53 cm/s LV PW:         1.00 cm     LV E/e' medial:  12.1 LV IVS:        1.30 cm     LV e' lateral:   8.05 cm/s LVOT diam:     2.20 cm     LV E/e' lateral: 9.9 LV SV:         129 LV SV Index:   80 LVOT Area:     3.80 cm  LV Volumes (MOD) LV vol d, MOD A2C: 75.6 ml LV vol d, MOD A4C: 82.1 ml LV vol s, MOD A2C: 25.9 ml LV vol s, MOD A4C: 29.6 ml LV SV MOD A2C:     49.7 ml LV SV MOD A4C:     82.1 ml LV SV MOD BP:      50.7 ml RIGHT VENTRICLE             IVC RV S prime:     15.80 cm/s  IVC diam: 2.20 cm TAPSE (M-mode): 1.4 cm LEFT ATRIUM             Index        RIGHT ATRIUM           Index LA diam:        3.90 cm 2.41 cm/m   RA Area:     13.00 cm LA Vol (A2C):   97.3 ml 60.12 ml/m  RA Volume:   25.30 ml  15.63 ml/m LA Vol (A4C):   80.3 ml 49.62 ml/m LA Biplane Vol: 93.7 ml 57.90 ml/m  AORTIC VALVE LVOT Vmax:   147.00 cm/s LVOT Vmean:  98.800 cm/s LVOT VTI:    0.340 m  AORTA Ao Root diam: 3.50 cm Ao Asc diam:  3.20 cm MITRAL VALVE MV Area (PHT): 5.38 cm    SHUNTS MV Decel Time: 141 msec    Systemic VTI:  0.34 m MV E velocity:  79.30 cm/s  Systemic Diam: 2.20 cm MV A velocity: 56.90 cm/s MV E/A ratio:  1.39 Rudean Haskell MD Electronically signed by Rudean Haskell MD Signature Date/Time: 05/13/2022/2:18:19 PM    Final    DG CHEST PORT 1 VIEW  Result Date: 05/13/2022 CLINICAL DATA:  67 year old female presents with respiratory distress. EXAM: PORTABLE CHEST 1 VIEW COMPARISON:  May 09, 2022 FINDINGS: EKG leads project over the chest. Cardiomediastinal contours and hilar structures are normal. Diffuse interstitial and airspace opacities are similar with slightly diminished lung volumes. No pneumothorax. On limited assessment no acute skeletal findings. IMPRESSION: Similar appearance of the chest accounting for slightly diminished lung volumes with findings of chronic interstitial lung disease. With this pattern it would be difficult to exclude superimposed infection or edema. Electronically Signed   By: Zetta Bills M.D.   On: 05/13/2022 12:32   CT Angio Chest/Abd/Pel for Dissection W and/or Wo Contrast  Result Date: 05/09/2022 CLINICAL DATA:  Chest pain radiating into the neck, initial encounter EXAM: CT ANGIOGRAPHY CHEST, ABDOMEN AND PELVIS TECHNIQUE: Non-contrast CT of the chest was initially obtained. Multidetector CT imaging through the chest, abdomen and pelvis was performed using the standard protocol during bolus administration of intravenous contrast. Multiplanar reconstructed images and MIPs were obtained and reviewed to evaluate the vascular anatomy. RADIATION DOSE  REDUCTION: This exam was performed according to the departmental dose-optimization program which includes automated exposure control, adjustment of the mA and/or kV according to patient size and/or use of iterative reconstruction technique. CONTRAST:  133m OMNIPAQUE IOHEXOL 350 MG/ML SOLN COMPARISON:  07/15/2020 FINDINGS: CTA CHEST FINDINGS Cardiovascular: Initial precontrast images demonstrate atherosclerotic calcification of the thoracic  aorta. No aneurysmal dilatation is seen. No hyperdense crescent to suggest acute aortic injury is noted. Postcontrast images were then obtained and reveal no evidence of aortic dissection. Heart is not significantly enlarged. The pulmonary artery shows a normal branching pattern bilaterally. No filling defect to suggest pulmonary embolism is noted. Coronary calcifications are noted. Mediastinum/Nodes: Thoracic inlet is within normal limits. Prominent AP window lymph node has resolved in the interval from the prior exam. Persistent right hilar lymph nodes are seen likely reactive in nature. The esophagus as visualized is within normal limits. Lungs/Pleura: Lungs demonstrate diffuse fibrotic changes and honeycombing particularly in the bases. The previously seen diffuse superimposed infiltrates have resolved. There is a 4 mm nodule in the right upper lobe on image number 26 of series 9. No other sizable nodules are seen. No effusions are noted. Musculoskeletal: Degenerative changes of the thoracic spine are noted. No acute rib abnormality is seen. Chronic compression deformities of T5 and T7 are noted. Review of the MIP images confirms the above findings. CTA ABDOMEN AND PELVIS FINDINGS VASCULAR Aorta: Atherosclerotic calcifications are noted without aneurysmal dilatation or dissection. Celiac: Patent without evidence of aneurysm, dissection, vasculitis or significant stenosis. SMA: Patent without evidence of aneurysm, dissection, vasculitis or significant stenosis. Renals: Both renal arteries are patent without evidence of aneurysm, dissection, vasculitis, fibromuscular dysplasia or significant stenosis. IMA: Patent without evidence of aneurysm, dissection, vasculitis or significant stenosis. Inflow: Iliacs demonstrate mild atherosclerotic calcification although no focal stenosis is seen. Veins: Visualized venous structures appear within normal limits. IVC filter is noted in satisfactory position. Review of the MIP  images confirms the above findings. NON-VASCULAR Hepatobiliary: Gallbladder has been surgically removed. Liver shows no focal abnormality. Pancreas: Unremarkable. No pancreatic ductal dilatation or surrounding inflammatory changes. Spleen: Normal in size without focal abnormality. Adrenals/Urinary Tract: Adrenal glands are within normal limits. Kidneys demonstrate a normal enhancement pattern bilaterally. No renal calculi or obstructive changes are seen. The bladder is partially distended. Stomach/Bowel: Fecal material is noted scattered throughout the colon. No obstructive or inflammatory changes are seen. The appendix is not well visualized and may have been surgically. No inflammatory changes are seen. The small bowel and stomach are unremarkable. Lymphatic: No lymphadenopathy is noted. Reproductive: Status post hysterectomy. No adnexal masses. Other: No abdominal wall hernia or abnormality. No abdominopelvic ascites. Musculoskeletal: Postsurgical changes are noted in the lower lumbar spine. Prior vertebral augmentation at L1 is seen. Chronic L3 compression fracture is noted. Review of the MIP images confirms the above findings. IMPRESSION: CTA of the chest: No evidence of aortic injury. No pulmonary embolism is seen. Persistent right hilar lymph nodes likely reactive in nature. Chronic fibrotic changes are noted in the lungs bilaterally similar to prior exam. 4 mm right solid pulmonary nodule within the upper lobe. Per Fleischner Society Guidelines, a non-contrast Chest CT at 12 months is optional. If performed and the nodule is stable at 12 months, no further follow-up is recommended. These guidelines do not apply to immunocompromised patients and patients with cancer. Follow up in patients with significant comorbidities as clinically warranted. For lung cancer screening, adhere to Lung-RADS guidelines. Reference: Radiology. 2017; 284(1):228-43. CTA of the abdomen  and pelvis: No aortic abnormality is noted. No  acute abnormality is seen. Electronically Signed   By: Inez Catalina M.D.   On: 05/09/2022 23:40   CT Head Wo Contrast  Result Date: 05/09/2022 CLINICAL DATA:  New onset headache EXAM: CT HEAD WITHOUT CONTRAST TECHNIQUE: Contiguous axial images were obtained from the base of the skull through the vertex without intravenous contrast. RADIATION DOSE REDUCTION: This exam was performed according to the departmental dose-optimization program which includes automated exposure control, adjustment of the mA and/or kV according to patient size and/or use of iterative reconstruction technique. COMPARISON:  None Available. FINDINGS: Brain: There is no mass, hemorrhage or extra-axial collection. The size and configuration of the ventricles and extra-axial CSF spaces are normal. The brain parenchyma is normal, without acute or chronic infarction. Vascular: No abnormal hyperdensity of the major intracranial arteries or dural venous sinuses. No intracranial atherosclerosis. Skull: The visualized skull base, calvarium and extracranial soft tissues are normal. Sinuses/Orbits: No fluid levels or advanced mucosal thickening of the visualized paranasal sinuses. No mastoid or middle ear effusion. The orbits are normal. IMPRESSION: Normal head CT. Electronically Signed   By: Ulyses Jarred M.D.   On: 05/09/2022 23:22   DG Chest Portable 1 View  Result Date: 05/09/2022 CLINICAL DATA:  Chest pain and shortness of breath EXAM: PORTABLE CHEST 1 VIEW COMPARISON:  07/15/2020 FINDINGS: Cardiac shadow is stable. Diffuse fibrotic changes are identified throughout both lungs. No focal infiltrate or sizable effusion is seen. No bony abnormality is noted. IMPRESSION: Changes consistent with pulmonary fibrosis without acute abnormality. Electronically Signed   By: Inez Catalina M.D.   On: 05/09/2022 22:03    Barton Dubois, MD  [facility]  If 7PM-7AM, please contact night-coverage [email or web address] Password [email or web address] 05/22/2022, 2:02 PM    LOS: 12 days

## 2022-05-22 NOTE — Progress Notes (Signed)
NAME:  [name] Fitzpatrick, MRN:  [id number], DOB:  [birth year], LOS: 40 ADMISSION DATE:  05/09/2022, CONSULTATION DATE:  05/10/2022 REFERRING MD:  Dr. 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  Six days prior to admission she had fever 102F, sinus pressure and headache, and nose bleed.  SpO2 was 88% on her baseline oxygen of 6 liters.  She was started on Bipap.  PCCM consulted to assist with respiratory management.  She was hospitalized in oct 2020 for pneumonia with ARDS & DVT.  She required intubation and tracheostomy.  She developed pulmonary fibrosis after this.  Of note, chest x-ray in January 2022 shows mild interstitial thickening she normally uses 8 liters oxygen at home.  Her activity level is severely limited due to her breathing.  She was followed by [facility] chest, was on hospice for 1 year until she self discharged. RHC could not be performed via femoral route due to IVC filter.  Maintained on Pulmicort, Anoro and Singulair  Pertinent  Medical History  DVT s/p IVC filter, ARDS, COPD, Depression, DM type 2, Fibromyalgia, GERD, Hashimoto's thyroiditis, HTN, IBS, Migraine headache, Seizure, Chronic pain, Atrial fibrillation  Significant Hospital Events: Including procedures, antibiotic start and stop dates in addition to other pertinent events   2/21 Admit  Scheduled Meds:  ALPRAZolam  0.5 mg Oral TID   arformoterol  15 mcg Nebulization BID   aspirin EC  81 mg Oral Daily   budesonide (PULMICORT) nebulizer solution  0.5 mg Nebulization BID   Chlorhexidine Gluconate Cloth  6 each Topical Q0600   clotrimazole  10 mg Oral 5 X Daily   docusate sodium  200 mg Oral BID   doxycycline  100 mg Oral Q12H   fluticasone  2 spray Each Nare Daily   guaiFENesin  1,200 mg  Oral BID   insulin aspart  0-6 Units Subcutaneous TID WC   ipratropium  2 spray Each Nare QID   levothyroxine  175 mcg Oral Q0600   loratadine  10 mg Oral Daily   methylPREDNISolone (SOLU-MEDROL) injection  40 mg Intravenous Q12H   morphine  15 mg Oral Q12H   mouth rinse  15 mL Mouth Rinse 4 times per day   pantoprazole  40 mg Oral BID AC   revefenacin  175 mcg Nebulization Daily   rosuvastatin  20 mg Oral Daily   senna-docusate  1 tablet Oral QHS   sertraline  100 mg Oral QHS   sodium chloride  2 spray Each Nare BID   Continuous Infusions: PRN Meds:.acetaminophen **OR** acetaminophen, albuterol, HYDROcodone bit-homatropine, HYDROmorphone, LORazepam, melatonin, mineral oil, morphine injection, nitroGLYCERIN, ondansetron (ZOFRAN) IV, mouth rinse, phenol, polyethylene glycol, prochlorperazine, traZODone    Tests:  PFT 02/26/19 >> FEV1 1.15 (51%), FEV1% 81, DLCO 27% PFT 03/01/21 >> FEV1 1.28 (58%), FEV1% 81 Echo 07/20/21 >> EF 60 to 65%, mild LVH LHC 08/03/21 >> normal coronary angiogram CT angio chest 05/09/22 >> diffuse fibrotic changes and honeycombing more at bases, 4 mm nodule RUL CT sinus 3/1  piror sinus surgery with a/f levels c/w acute and chronic sinusitis  RHc and LHC 10/07/18 Normal coronary arteries, Normal cardiac output, Mild Pulmonary HTN  CT sinuses -trace layering fluid, mild acute on chronic sinus disease  Interim History /  Subjective:  Breathing continues to improve Coughing up blood stained sputum, dark brown Afebrile Down to 6 to 7 L nasal cannula Objective   Blood pressure 108/88, pulse 82, temperature 98.2 F (36.8 C), temperature source Oral, resp. rate (!) 24, height '5\' 2"'$  (1.575 m), weight 59.4 kg, SpO2 100 %.        Intake/Output Summary (Last 24 hours) at 05/22/2022 1354 Last data filed at 05/22/2022 1309 Gross per 24 hour  Intake 960 ml  Output --  Net 960 ml    Filed Weights   05/20/22 0500 05/21/22 0500 05/22/22 0500  Weight: 58.4 kg 59.4 kg 59.4  kg    Examination:  General appearance:    elderly wf/chronically ill-appearing, lying supine in bed  No jvd Oropharynx clear,  mucosa nl Neck supple Lungs -no accessory muscle use, crackles right base, scattered rhonchi RRR no s3 or or sign murmur Abd soft / nl  excursion  Extr warm with no edema  - severe clubbing noted Neuro  Sensorium intact ,  no apparent motor deficits   Labs show mild hyponatremia, leukocytosis, stable anemia   CXR:   portable 05/15/22  Bilateral interstitial infiltrates as prior, no effusions or pneumonia   Assessment & Plan:   Acute on chronic hypoxic/hypercapnic respiratory failure likely from acute viral respiratory infection in setting of severe pulmonary fibrosis and COPD exacerbation. 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 Will need further evaluation with high-resolution CT chest in the future - goal SpO2 88 to 92%, she is down to 7 L nasal cannula which is within what we can provide at home, her home concentrator goes up to 10 L - continue yupelri, brovana, pulmicort - prn albuterol - drop solumedrol 40 mg daily - mucinex, flutter valve - max gerd rx  -Will prescribe pulmonary rehab as outpatient  Rhinitis/ ? Sinusitis  -Doxycycline    Atrial fibrillation with PFO. - previously followed by cardiology at [facility]. Chronic pain. Hypothyroidism. DM type 2 poorly controlled with steroid induced hyperglycemia. Epistaxis. - per primary team  Goals of care: Full scope of care.  She has 2 grandchildren and she would like to live a full life.  We will refer her to pulmonary rehab as outpatient.  Doubt she would be a great transplant candidate .  We will arrange for outpatient follow-up at our practice, she would like to switch from [facility] chest.  PCCM available as needed      Best Practice (right click and "Reselect all SmartList Selections" daily)   Diet/type:  Regular consistency (see orders) DVT prophylaxis: SCD GI prophylaxis: PPI Lines: N/A Foley:  N/A Code Status:  full code Last date of multidisciplinary goals of care discussion '[x]'$   Labs       Latest Ref Rng & Units 05/21/2022    3:18 AM 05/15/2022    8:51 AM 05/13/2022   12:45 PM  CMP  Glucose 70 - 99 mg/dL 156  108  160   BUN 8 - 23 mg/dL '14  14  16   '$ Creatinine 0.44 - 1.00 mg/dL 0.55  0.50  0.55   Sodium 135 - 145 mmol/L 133  133  134   Potassium 3.5 - 5.1 mmol/L 3.9  4.3  3.9   Chloride 98 - 111 mmol/L 94  93  97   CO2 22 - 32 mmol/L 30  34  29   Calcium 8.9 - 10.3 mg/dL 8.5  8.6  8.6   Total Protein 6.5 - 8.1 g/dL   5.7   Total Bilirubin 0.3 - 1.2 mg/dL   0.4   Alkaline Phos 38 - 126 U/L   46   AST 15 - 41 U/L   16   ALT 0 - 44 U/L   15        Latest Ref Rng & Units 05/21/2022    3:18 AM 05/15/2022    8:51 AM 05/13/2022   12:45 PM  CBC  WBC 4.0 - 10.5 K/uL 20.0  10.4  10.5   Hemoglobin 12.0 - 15.0 g/dL 10.2  10.6  9.9   Hematocrit 36.0 - 46.0 % 31.5  33.9  31.6   Platelets 150 - 400 K/uL 178  227  207     ABG    Component Value Date/Time   PHART 7.38 05/13/2022 1215   PCO2ART 61 (H) 05/13/2022 1215   PO2ART 145 (H) 05/13/2022 1215   HCO3 36.1 (H) 05/13/2022 1215   TCO2 32 05/25/2020 1315   ACIDBASEDEF 0.1 01/30/2010 2033   O2SAT 99.9 05/13/2022 1215    CBG (last 3)  Recent Labs    05/21/22 2219 05/22/22 0757 05/22/22 1151  GLUCAP 169* 82 136*        Lab Results  Component Value Date   ESRSEDRATE 4 05/18/2022    Lab Results  Component Value Date   TSH 0.445 05/09/2022    Kara Mead MD. FCCP. [facility] Pager : [phone]  If no response to pager , please call [phone] until 7 pm After 7:00 pm call Elink  [phone]   05/22/2022

## 2022-05-22 NOTE — TOC Progression Note (Signed)
Transition of Care [facility]) - Progression Note    Patient Details  Name: [name] Fitzpatrick MRN: [id number] Date of Birth: [birth year]  Transition of Care [facility] CM/SW Contact  Shade Flood, LCSW Phone Number: 05/22/2022, 2:08 PM  Clinical Narrative:     TOC following. Per MD, pt will likely dc home tomorrow. Discussed NIV vs CPAP. Dr. Elsworth Soho states pt will need new CPAP. Does not qualify for NIV. Updated Caryl Pina at [place]. They will work with nurses at Dr. Bari Mantis office for the documentation they need for new CPAP.   [facility] will follow pt at home after dc.  TOC will follow up in AM.  Expected Discharge Plan: [place] Barriers to Discharge: Continued Medical Work up  Expected Discharge Plan and [place] arrangements for the past 2 months: Single Family Home                               Date [place]: 05/21/22 Time [place]: [id number] Representative spoke with at [place]: [place] ([place]) Interventions SDOH Screenings   Food Insecurity: No Food Insecurity (05/11/2022)  Housing: Low Risk  (05/11/2022)  Transportation Needs: No Transportation Needs (05/11/2022)  Utilities: Not At Risk (05/11/2022)  Tobacco Use: Medium Risk (05/14/2022)    Readmission Risk Interventions    05/21/2022   11:38 AM 07/18/2020   11:09 AM  Readmission Risk Prevention Plan  Transportation Screening Complete Complete  PCP or Specialist Appt within 3-5 Days Not Complete   Home Care Screening  Complete  Medication Review (RN CM)  Complete  HRI or Home Care Consult Complete   Social Work Consult for [place] Planning/Counseling Complete   Palliative Care Screening Not Applicable   Medication Review Press photographer) Complete

## 2022-05-23 LAB — GLUCOSE, CAPILLARY
Glucose-Capillary: 133 mg/dL — ABNORMAL HIGH (ref 70–99)
Glucose-Capillary: 115 mg/dL — ABNORMAL HIGH (ref 70–99)
Glucose-Capillary: 164 mg/dL — ABNORMAL HIGH (ref 70–99)
Glucose-Capillary: 85 mg/dL (ref 70–99)

## 2022-05-23 MED ORDER — ASPIRIN EC 81 MG PO TBEC: 81.0000 mg | DELAYED_RELEASE_TABLET | Freq: Every day | ORAL | 11 refills | Status: AC

## 2022-05-23 MED ORDER — FLUTICASONE PROPIONATE 50 MCG/ACT NA SUSP: 2.0000 | Freq: Every day | NASAL | 3 refills | Status: AC

## 2022-05-23 MED ORDER — IPRATROPIUM-ALBUTEROL 0.5-2.5 (3) MG/3ML IN SOLN: 3.0000 mL | Freq: Four times a day (QID) | RESPIRATORY_TRACT | 1 refills | Status: AC | PRN

## 2022-05-23 MED ORDER — PREDNISONE 10 MG PO TABS: 10.0000 mg | ORAL_TABLET | ORAL | 0 refills | Status: AC

## 2022-05-23 MED ORDER — DOXYCYCLINE HYCLATE 100 MG PO TABS: 100.0000 mg | ORAL_TABLET | Freq: Two times a day (BID) | ORAL | 0 refills | Status: AC

## 2022-05-23 MED ORDER — PANTOPRAZOLE SODIUM 40 MG PO TBEC: 40.0000 mg | DELAYED_RELEASE_TABLET | Freq: Two times a day (BID) | ORAL | 3 refills | Status: AC

## 2022-05-23 MED ORDER — BUDESONIDE 0.25 MG/2ML IN SUSP: 0.2500 mg | Freq: Two times a day (BID) | RESPIRATORY_TRACT | 12 refills | Status: AC

## 2022-05-23 MED ORDER — MONTELUKAST SODIUM 10 MG PO TABS: 10.0000 mg | ORAL_TABLET | Freq: Every day | ORAL | 2 refills | Status: AC

## 2022-05-23 MED ORDER — SENNOSIDES-DOCUSATE SODIUM 8.6-50 MG PO TABS: 2.0000 | ORAL_TABLET | Freq: Every day | ORAL | 1 refills | Status: AC

## 2022-05-23 NOTE — Progress Notes (Signed)
    SATURATION QUALIFICATIONS: (This note is used to comply with regulatory documentation for home oxygen)   Patient Saturations on Room Air at Rest = 83 %   Patient Saturations on Room Air while Ambulating = 81 %   Patient Saturations on 8 Liters of oxygen while Ambulating = 93 %       Patient needs continuous O2 at 8 L/min continuously via nasal cannula with humidifier, with gaseous portability and conserving device    Dx 1)COPD 2) pulmonary fibrosis  Roxan Hockey, MD

## 2022-05-23 NOTE — TOC Transition Note (Signed)
Transition of Care [facility]) - CM/SW Discharge Note   Patient Details  Name: [name] Fitzpatrick MRN: [id number] Date of Birth: 09-09-1955  Transition of Care [facility]) CM/SW Contact:  Ihor Gully, LCSW Phone Number: 05/23/2022, 2:57 PM   Clinical Narrative:    Spoke with Ashly with [employer]. Advised of increased liter flow. Attending placed new o2 orders and completed saturation qualification.    Final next level of care: Home w Home Health Services Barriers to Discharge: No Barriers Identified   Patient Goals and CMS Choice CMS Medicare.gov Compare Post Acute Care list provided to:: Patient Choice offered to / list presented to : Patient  Discharge Placement                         Discharge Plan and Services Additional resources added to the After Visit Summary for                                Date [place]: 05/21/22 Time [place]: [id number] Representative spoke with at [place]: Billey Chang  Social Determinants of Health ([place]) Interventions SDOH Screenings   Food Insecurity: No Food Insecurity (05/11/2022)  Housing: Low Risk  (05/11/2022)  Transportation Needs: No Transportation Needs (05/11/2022)  Utilities: Not At Risk (05/11/2022)  Tobacco Use: Medium Risk (05/14/2022)     Readmission Risk Interventions    05/21/2022   11:38 AM 07/18/2020   11:09 AM  Readmission Risk Prevention Plan  Transportation Screening Complete Complete  PCP or Specialist Appt within 3-5 Days Not Complete   Home Care Screening  Complete  Medication Review (RN CM)  Complete  HRI or Home Care Consult Complete   Social Work Consult for [place] Planning/Counseling Complete   Palliative Care Screening Not Applicable   Medication Review Press photographer) Complete

## 2022-05-23 NOTE — Discharge Summary (Addendum)
[name] Fitzpatrick, is a 67 y.o. female  DOB 12-Jan-1956  MRN [id number].  Admission date:  05/09/2022  Admitting Physician  Rhetta Mura, DO  Discharge Date:  05/23/2022   Primary MD  Joya Gaskins, FNP  Recommendations for primary care physician for things to follow:   1)----Slow Prednisone taper over a couple of weeks --take Prednisone 4 tablets (40 mg) daily x 3 days, take 3 tablets (30 mg) daily for 3 days, then 2 Tab (20 mg) daily x 3 days , then 1 Tablet daily x 3 days, then STOP 2)Follow - up with Dr. Kara Mead Pulmonologist in [place] referral to pulmonary rehab, possible outpatient sleep study/CPAP testing and possible outpatient high-resolution CT scan -Address: [address] [place],  [address] Phone: [phone] OR call [phone] -Patient needs to be seen in the [place] office, Not in [place] 3)you need oxygen at home at 8 L via nasal cannula continuously while awake and while asleep--- smoking or having open fires around oxygen can cause fire, significant injury and death  Admission Diagnosis  Acute on chronic respiratory failure with hypoxia ([place]) [J96.21] Acute on chronic hypoxic respiratory failure ([place]) [J96.21]   Discharge Diagnosis  Acute on chronic respiratory failure with hypoxia ([place]) [J96.21] Acute on chronic hypoxic respiratory failure (HCC) [J96.21]    Principal Problem:   Acute on chronic hypoxic respiratory failure ([place]) Active Problems:   Acquired hypothyroidism   Allergic rhinitis   GAD (generalized anxiety disorder)   Acute exacerbation of idiopathic pulmonary fibrosis (HCC)   Leukocytosis   DM2 (diabetes mellitus, type 2) ([place])   Anemia of chronic disease      Past Medical History:  Diagnosis Date   Acute deep vein thrombosis (DVT) of left femoral vein (HCC)    Acute on chronic respiratory failure with hypoxia (HCC)    Anxiety     ARDS (adult respiratory distress syndrome) (HCC)    Chronic LBP    Chronic neck pain    COPD (chronic obstructive pulmonary disease) ([place])    Depression    DM type 2 (diabetes mellitus, type 2) ([place])    Dyspnea 05/25/2020   Fibromyalgia    GERD (gastroesophageal reflux disease)    H/O pulmonary fibrosis    H/O suicide attempt    Hashimoto's thyroiditis    Hypertension    Hypothyroidism    IBS (irritable bowel syndrome)    with constipation   Interstitial pneumonia ([place])    Migraine headache    Osteoarthritis    Seizure disorder ([place])    Thyroiditis, 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   had total thyroidectomy 11/30/2013 at [facility]    Past Surgical History:  Procedure Laterality Date   ABDOMINAL HYSTERECTOMY     b/l foot surgery --bunions     BIOPSY  07/29/2015   Procedure: BIOPSY;  Surgeon: Rogene Houston, MD;  Location: [facility];  Service: Endoscopy;;  Duodenal,  gastric, and esophageal  biopsies   C-Spine fusion  1991, 2004   CHOLECYSTECTOMY     COLONOSCOPY  WITH PROPOFOL N/A 07/29/2015   Procedure: COLONOSCOPY WITH PROPOFOL;  Surgeon: Rogene Houston, MD;  Location: [facility];  Service: Endoscopy;  Laterality: N/A;  8:30   ESOPHAGEAL DILATION N/A 07/29/2015   Procedure: ESOPHAGEAL DILATION;  Surgeon: Rogene Houston, MD;  Location: [facility];  Service: Endoscopy;  Laterality: N/A;   ESOPHAGOGASTRODUODENOSCOPY (EGD) WITH PROPOFOL N/A 07/29/2015   Procedure: ESOPHAGOGASTRODUODENOSCOPY (EGD) WITH PROPOFOL;  Surgeon: Rogene Houston, MD;  Location: [facility];  Service: Endoscopy;  Laterality: N/A;   l carpal tunnel release Right    l spine fusion x 2     reconstucted right ankle     Right knee arthroscopic surgery     total reconstruction of right ankle and heel     total of three    VESICOVAGINAL FISTULA CLOSURE W/ TAH  1993   Fibroids no Ca       HPI  from the history and physical done on the day of admission:   Chief Complaint: sob   HPI: [name] Fitzpatrick is a  67 y.o. female with medical history significant for chronic hypoxic respiratory failure on 6 L continuous nasal cannula, pulmonary fibrosis, type 2 diabetes mellitus, acquired hypothyroidism, anemia of chronic disease associated baseline hemoglobin 9-11, who is admitted to [facility] on 05/09/2022 with acute on chronic hypoxic respiratory failure after presenting from home to [facility] ED complaining of shortness of breath.    The patient reports 2 days of progressive shortness of breath associated with worsening nonproductive cough.  Denies any associated orthopnea, PND, or worsening peripheral edema.  No associate with any subjective fever, chills, rigors, or generalized myalgias.  No associated any palpitations, diaphoresis, dizziness, presyncope, or syncope.  No recent trauma or travel.  Denies any recent new onset calf tenderness or new lower extremity erythema.   Medical history notable for chronic hypoxic respiratory failure on baseline 6 L continuous nasal cannula in the setting of pulmonary fibrosis.  Reviewed that she is a former smoker, having completely quit smoking approximately 10 years ago after nearly 4-year history of smoking.  Per chart review, no prior echocardiogram on file.         ED Course:  Vital signs in the ED were notable for the following: afebrile; heart rate 0000000; systolic blood pressures in the 90s to 130s; respiratory rate 19-24, initial oxygen saturation 88% on her baseline 6 L nasal cannula, subsequently increasing into the range of 96 to 100% on BiPAP.   Labs were notable for the following: CMP notable for the following: Sodium 134, troponin 26, BUN 20, creatinine 0.74 compared to most recent prior serum creatinine to point to 0.60 in May 2023, BUN to creatinine ratio greater than 20, liver enzymes within normal limits.  BNP 274 compared to most recent prior value of 337 in April 2022.  High-sensitivity troponin I initially 11, 3 value trending up slightly to 13.  CBC  notable for white blood cell count 11,800, hemoglobin 10.2 compared to most recent prior value of 9.3 in May 2022.  TSH 0.445.  COVID-19, RSV, and influenza PCR were all negative.  Viral respiratory panel currently pending.   Per my interpretation, EKG in ED demonstrated the following: Sinus rhythm with incomplete right bundle branch block, and no evidence of T wave or ST changes, including no evidence of ST elevation.   Imaging and additional notable ED work-up: CTA chest, per formal radiology read, and in comparison to CT chest from April 2022  shows no evidence of acute pulmonary embolism nor any evidence of aortic aneurysm or dissection, while showing chronic fibrotic changes in the bilateral lungs, similar appearance to most recent prior CT chest, and demonstrates no evidence of acute cardiopulmonary process, including no evidence of infiltrate, edema, effusion, or pneumothorax.  Additionally, CTA of the abdomen/pelvis, per formal radiology read, shows no evidence of acute intra-abdominal or acute intrapelvic process.   While in the ED, the following were administered: Solu-Medrol 80 mg IV x 1, Ativan 0.5 mg IV x 1, 2 nebulizer treatment x 1, fentanyl 50 mcg IV x 2, Toradol 15 mg IV x 1.   Subsequently, the patient was admitted for further evaluation management acute on chronic hypoxic respiratory failure in the setting of suspected acute pulmonary fibrosis exacerbation.     Hospital Course:     Acute on chronic respiratory failure with hypoxia -Secondary to exacerbation of pulmonary fibrosis/COPD ----patient most likely has underlying postinflammatory fibrosis as well as COPD -Suspect underlying viral infection -Chronically on 8 L nasal cannula at home -Required up to 15 L of oxygen during this hospitalization -05/09/2022 CTA chest negative for PE, consolidation --continue Pulmicort, Maretta Bees and Brovana -COVID-19 PCR negative, RSV negative, influenza negative - PCT <0.10 -viral resp  panel--neg ---2/25 Echo--EF 60-65%, no WMA, normal RVF, trivial MR --check troponins, bmp, cbc. -Patient-overall condition guarded and poor.  Per her reports outpatient pulmonologist and cardiologist has expressed reaching terminal illness and she used to be on home hospice. -Appreciate assistance and recommendation by pulmonology service. -ESR has been checked and is only 2 currently. --Treated with IV steroids  -Pulmonologist  recommends slow prednisone taper postdischarge -Overall long-term prognosis is poor and guarded. -Outpatient pulmonary rehab will be arranged. -Overall improved with above measures, oxygen requirement back to baseline of 8 L per nasal cannula -Outpatient referral to pulmonary rehab, possible outpatient sleep study/CPAP testing and possible outpatient high-resolution CT scan   COPD exacerbation -Treated with IV Solu-Medrol okay to discharge on p.o. prednisone taper as above --continue Yupelri, brovana, pulmicort -Oxygen requirement back to baseline of 8 L -Continue steroid tapering and follow pulmonology service recommendation.   Acute on chronic sinusitis -After discussing with pulmonology service will provide treatment of any underlying infection. -CT maxilla/sinus with acute on chronic sinusitis -Leukocytosis may persist due to steroids -Okay to complete doxycycline   Anxiety disorder -Continue home dose alprazolam, Zoloft -  Opioid dependence -PDMP reviewed--patient receives morphine ER 15 mg, 90 monthly -Patient receives Dilaudid 4 mg, #120 monthly -Patient has been continue on extended release morphine every 12 hours,    Hypothyroidism -Continue Synthroid   Diabetes mellitus type 2 -Appears to be managed by lifestyle modification -A1c 5.3 reflecting excellent diabetic control PTA -Anticipate some hyperglycemia while on steroids   Mixed hyperlipidemia -continue statin.   [place] -Patient has expressed wanting full scope treatment practice and  resuscitation if required -Patient-overall condition guarded and poor.  Per her reports outpatient pulmonologist and cardiologist has expressed reaching terminal illness and she used to be on home hospice. -Appreciate assistance and recommendation by palliative care.    Discharge Condition: stable on 8L/min of oxygen  Follow UP   Follow-up Information     [place] Follow up.   Why: Home health will call to schedule your first visit.        [facility] Follow up.   Why: Home oxygen Contact information: [place] [address] [phone]         Kara Mead  V, MD. Schedule an appointment as soon as possible for a visit in 1 week(s).   Specialty: Pulmonary Disease Contact information: [id number] S.  [address] [place] [address] [phone]                  Consults obtained - pulm/palliative  Diet and Activity recommendation:  As advised  Discharge Instructions   Discharge Instructions     AMB referral to pulmonary rehabilitation   Complete by: As directed    Advanced COPD and underlying advanced pulmonary fibrosis--uses 8 L of oxygen via nasal cannula at baseline   Please select a program: Pulmonary Rehabilitation   Diagnosis: (See process instructions below for COPD requirements): COPD-Gold 4: Very Severe FEV1 <30% predicted   Program Prescription:  O2 Administration by RT, EP, or RN if SpO2<88% Spirometry if indicated ([facility] only) Flutter Valve if indicated     After initial evaluation and assessments completed: Virtual Based Care may be provided alone or in conjunction with Pulmonary Rehab/Respiratory Care services based on patient barriers.: Yes   Call MD for:  difficulty breathing, headache or visual disturbances   Complete by: As directed    Call MD for:  persistant dizziness or light-headedness   Complete by: As directed    Call MD for:  persistant nausea and vomiting   Complete by: As directed    Call MD for:  temperature  >100.4   Complete by: As directed    Diet - low sodium heart healthy   Complete by: As directed    Discharge instructions   Complete by: As directed    1)----Slow Prednisone taper over a couple of weeks --take Prednisone 4 tablets (40 mg) daily x 3 days, take 3 tablets (30 mg) daily for 3 days, then 2 Tab (20 mg) daily x 3 days , then 1 Tablet daily x 3 days, then STOP 2)Follow - up with Dr. Kara Mead Pulmonologist in [place]-for referral to pulmonary rehab, possible outpatient sleep study/CPAP testing and possible outpatient high-resolution CT scan -Address: [address], [place], [place] [address] Phone: [phone] OR call [phone] -Patient needs to be seen in the [place] office, Not in [place] 3)you need oxygen at home at 8 L via nasal cannula continuously while awake and while asleep--- smoking or having open fires around oxygen can cause fire, significant injury and death   Increase activity slowly   Complete by: As directed          Discharge Medications     Allergies as of 05/23/2022       Reactions   Levofloxacin Shortness Of Breath, Itching   WHELPS WHELPS WHELPS WHELPS   Penicillins Other (See Comments)   Stroke-like symptoms Has patient had a PCN reaction causing immediate rash, facial/tongue/throat swelling, SOB or lightheadedness with hypotension: YES Has patient had a PCN reaction causing severe rash involving mucus membranes or skin necrosis: No Has patient had a PCN reaction that required hospitalization: No Has patient had a PCN reaction occurring within the last 10 years: Yes If all of the above answers are "NO", then may proceed with Cephalosporin use.   Prednisone Shortness Of Breath, Nausea And Vomiting   Cephalexin Hives, Swelling, Rash   Latex Rash, Other (See Comments)   Causes skin to tear easily   Metoclopramide Hcl Hives, Rash        Medication List     STOP taking these medications    dicyclomine 10 MG capsule Commonly  known as: BENTYL   gabapentin  300 MG capsule Commonly known as: NEURONTIN   losartan 25 MG tablet Commonly known as: COZAAR   naloxone 4 MG/0.1ML Liqd nasal spray kit Commonly known as: NARCAN   promethazine 25 MG tablet Commonly known as: PHENERGAN       TAKE these medications    acetaminophen 500 MG tablet Commonly known as: TYLENOL Take 1,000 mg by mouth every 6 (six) hours as needed for mild pain.   ALPRAZolam 0.5 MG tablet Commonly known as: XANAX Take 0.5 mg by mouth 3 (three) times daily as needed for anxiety.   aspirin EC 81 MG tablet Take 1 tablet (81 mg total) by mouth daily with breakfast. What changed: when to take this   budesonide 0.25 MG/2ML nebulizer solution Commonly known as: PULMICORT Take 2 mLs (0.25 mg total) by nebulization 2 (two) times daily.   doxycycline 100 MG tablet Commonly known as: VIBRA-TABS Take 1 tablet (100 mg total) by mouth 2 (two) times daily for 5 days.   fluticasone 50 MCG/ACT nasal spray Commonly known as: FLONASE Place 2 sprays into both nostrils daily.   HYDROmorphone 4 MG tablet Commonly known as: DILAUDID Take 4 mg by mouth every 6 (six) hours as needed for moderate pain.   ipratropium-albuterol 0.5-2.5 (3) MG/3ML Soln Commonly known as: DUONEB Take 3 mLs by nebulization every 6 (six) hours as needed.   levothyroxine 175 MCG tablet Commonly known as: SYNTHROID Take 175 mcg by mouth daily.   montelukast 10 MG tablet Commonly known as: SINGULAIR Take 1 tablet (10 mg total) by mouth at bedtime.   morphine 15 MG 12 hr tablet Commonly known as: MS CONTIN Take 15 mg by mouth every 8 (eight) hours.   pantoprazole 40 MG tablet Commonly known as: PROTONIX Take 1 tablet (40 mg total) by mouth 2 (two) times daily.   predniSONE 10 MG tablet Commonly known as: DELTASONE Take 1 tablet (10 mg total) by mouth See admin instructions. Take 4 tablets (40 mg) daily x 3 days, take 3 tablets (30 mg) daily for 3 days, then 2  Tab (20 mg) daily x 3 days , then 1 Tablet daily x 3 days, then STOP   rosuvastatin 20 MG tablet Commonly known as: CRESTOR Take 20 mg by mouth daily.   senna-docusate 8.6-50 MG tablet Commonly known as: Senokot-S Take 2 tablets by mouth at bedtime.   sertraline 100 MG tablet Commonly known as: ZOLOFT Take 100 mg by mouth in the morning and at bedtime.   traZODone 100 MG tablet Commonly known as: DESYREL Take 100-200 mg by mouth at bedtime as needed for sleep.               Durable Medical Equipment  (From admission, onward)           Start     Ordered   05/23/22 1048  For home use only DME oxygen  Once       Comments: SATURATION QUALIFICATIONS: (This note is used to comply with regulatory documentation for home oxygen)   Patient Saturations on Room Air at Rest = 83 %   Patient Saturations on Room Air while Ambulating = 81 %   Patient Saturations on 8 Liters of oxygen while Ambulating = 93 %       Patient needs continuous O2 at 8 L/min continuously via nasal cannula with humidifier, with gaseous portability and conserving device    Dx 1)COPD 2) pulmonary fibrosis  Roxan Hockey, MD  Question Answer Comment  Length of  Need Lifetime   Mode or (Route) Nasal cannula   Liters per Minute 8   Frequency Continuous (stationary and portable oxygen unit needed)   Oxygen conserving device Yes   Oxygen delivery system Gas      05/23/22 1052            Major procedures and Radiology Reports - PLEASE review detailed and final reports for all details, in brief -   CT MAXILLOFACIAL  LTD WO CM  Result Date: 05/19/2022 CLINICAL DATA:  Initial evaluation for cough. EXAM: CT PARANASAL SINUS LIMITED WITHOUT CONTRAST TECHNIQUE: Multidetector CT images of the paranasal sinuses were obtained using the standard protocol without intravenous contrast. RADIATION DOSE REDUCTION: This exam was performed according to the departmental dose-optimization program which includes  automated exposure control, adjustment of the mA and/or kV according to patient size and/or use of iterative reconstruction technique. COMPARISON:  Prior head CT from 05/09/2022. FINDINGS: Paranasal sinuses: Frontal: Normally aerated. Patent frontal sinus drainage pathways. Ethmoid: Probable changes of prior partial ethmoidectomy. Mild-to-moderate mucoperiosteal thickening about the residual ethmoidal air cells, chronic in appearance. Maxillary: Mild chronic mucoperiosteal thickening present about the maxillary sinuses bilaterally, slightly worse on the right. Sphenoid: Trace layering fluid with pneumatized secretions present within the sphenoid sinuses. Left sphenoid sinus dominant. Right ostiomeatal unit: Patent with sequelae of prior antrostomy with uncinectomy. Left ostiomeatal unit: Patent with sequelae of prior antrostomy with uncinectomy. Nasal passages: Postoperative changes from prior bilateral turbinectomy. Nasal passages are clear. No more than trace 2 mm left-to-right nasal septal deviation. Anatomy: No pneumatization superior to anterior ethmoid notches. Symmetric and intact olfactory grooves and fovea ethmoidalis, Keros II (4-48m). Presellar sphenoid pneumatization pattern. No dehiscence of carotid or optic canals. No onodi cell. Other: Mastoid air cells and middle ear cavities are well pneumatized and free of fluid. Visualized intracranial contents demonstrate no significant finding. Scattered vascular calcifications noted within the carotid siphons. Prior bilateral ocular lens replacement. Globes and orbital soft tissues demonstrate no acute finding. Remainder of the visualized soft tissues of the face are within normal limits. IMPRESSION: Sequelae of prior endoscopic sinus surgery with mild to moderate chronic mucoperiosteal thickening about the residual ethmoidal air cells and maxillary sinuses. Trace layering fluid with pneumatized secretions within the sphenoid sinuses, suggesting acute on  chronic sinus disease. Electronically Signed   By: BJeannine BogaM.D.   On: 05/19/2022 02:50   DG CHEST PORT 1 VIEW  Result Date: 05/15/2022 CLINICAL DATA:  Respiratory distress EXAM: PORTABLE CHEST 1 VIEW COMPARISON:  05/13/2022.  CTA chest of 05/09/2022 FINDINGS: Midline trachea. Normal heart size. No pleural effusion or pneumothorax. Underlying basilar and peripheral predominant accentuated pulmonary interstitium, consistent with pulmonary fibrosis. No convincing evidence of acute superimposed lobar consolidation. IMPRESSION: Severe interstitial lung disease. This limits sensitivity of plain films. No convincing evidence of acute superimposed process. Electronically Signed   By: KAbigail MiyamotoM.D.   On: 05/15/2022 08:50   ECHOCARDIOGRAM COMPLETE  Result Date: 05/13/2022    ECHOCARDIOGRAM REPORT   Patient Name:   [name] SELLDate of Exam: 05/13/2022 Medical Rec #:  [id number]          Height:       62.0 in Accession #:    [id number]         Weight:       135.1 lb Date of Birth:  110-26-1957          BSA:          1.618  m Patient Age:    7 years            BP:           116/52 mmHg Patient Gender: F                   HR:           60 bpm. Exam Location:  [facility] Procedure: 2D Echo, Cardiac Doppler and Color Doppler Indications:    R07.9* Chest pain, unspecified; R06.02 SOB  History:        Patient has no prior history of Echocardiogram examinations.                 Abnormal ECG, COPD, Arrythmias:Tachycardia;                 Signs/Symptoms:Shortness of Breath, Dyspnea and Chest Pain.                 Pulmonary fibrosis.  Sonographer:    Roseanna Rainbow RDCS Referring Phys: [phone] DAVID TAT  Sonographer Comments: Technically difficult study due to poor echo windows. Chest wall deformity. IMPRESSIONS  1. Left ventricular ejection fraction, by estimation, is 60 to 65%. The left ventricle has normal function. The left ventricle has no regional wall motion abnormalities. There is mild asymmetric left  ventricular hypertrophy of the basal-septal segment. Left ventricular diastolic parameters are indeterminate. Elevated left atrial pressure.  2. Right ventricular systolic function is normal. The right ventricular size is normal. Tricuspid regurgitation signal is inadequate for assessing PA pressure.  3. Left atrial size was severely dilated.  4. The mitral valve is normal in structure. Trivial mitral valve regurgitation. No evidence of mitral stenosis.  5. The aortic valve is tricuspid. Aortic valve regurgitation is not visualized.  6. The inferior vena cava is dilated in size with >50% respiratory variability, suggesting right atrial pressure of 8 mmHg. Comparison(s): No prior Echocardiogram. FINDINGS  Left Ventricle: Left ventricular ejection fraction, by estimation, is 60 to 65%. The left ventricle has normal function. The left ventricle has no regional wall motion abnormalities. The left ventricular internal cavity size was small. There is mild asymmetric left ventricular hypertrophy of the basal-septal segment. Left ventricular diastolic parameters are indeterminate. Elevated left atrial pressure. Right Ventricle: The right ventricular size is normal. No increase in right ventricular wall thickness. Right ventricular systolic function is normal. Tricuspid regurgitation signal is inadequate for assessing PA pressure. Left Atrium: Left atrial size was severely dilated. Right Atrium: Right atrial size was normal in size. Pericardium: There is no evidence of pericardial effusion. Mitral Valve: The mitral valve is normal in structure. Trivial mitral valve regurgitation. No evidence of mitral valve stenosis. Tricuspid Valve: The tricuspid valve is normal in structure. Tricuspid valve regurgitation is not demonstrated. No evidence of tricuspid stenosis. Aortic Valve: The aortic valve is tricuspid. Aortic valve regurgitation is not visualized. Pulmonic Valve: The pulmonic valve was not well visualized. Pulmonic valve  regurgitation is not visualized. No evidence of pulmonic stenosis. Aorta: The aortic root and ascending aorta are structurally normal, with no evidence of dilitation. Venous: The inferior vena cava is dilated in size with greater than 50% respiratory variability, suggesting right atrial pressure of 8 mmHg. IAS/Shunts: The atrial septum is grossly normal.  LEFT VENTRICLE PLAX 2D LVIDd:         3.10 cm     Diastology LVIDs:         2.10 cm     LV e' medial:  6.53 cm/s LV PW:         1.00 cm     LV E/e' medial:  12.1 LV IVS:        1.30 cm     LV e' lateral:   8.05 cm/s LVOT diam:     2.20 cm     LV E/e' lateral: 9.9 LV SV:         129 LV SV Index:   80 LVOT Area:     3.80 cm  LV Volumes (MOD) LV vol d, MOD A2C: 75.6 ml LV vol d, MOD A4C: 82.1 ml LV vol s, MOD A2C: 25.9 ml LV vol s, MOD A4C: 29.6 ml LV SV MOD A2C:     49.7 ml LV SV MOD A4C:     82.1 ml LV SV MOD BP:      50.7 ml RIGHT VENTRICLE             IVC RV S prime:     15.80 cm/s  IVC diam: 2.20 cm TAPSE (M-mode): 1.4 cm LEFT ATRIUM             Index        RIGHT ATRIUM           Index LA diam:        3.90 cm 2.41 cm/m   RA Area:     13.00 cm LA Vol (A2C):   97.3 ml 60.12 ml/m  RA Volume:   25.30 ml  15.63 ml/m LA Vol (A4C):   80.3 ml 49.62 ml/m LA Biplane Vol: 93.7 ml 57.90 ml/m  AORTIC VALVE LVOT Vmax:   147.00 cm/s LVOT Vmean:  98.800 cm/s LVOT VTI:    0.340 m  AORTA Ao Root diam: 3.50 cm Ao Asc diam:  3.20 cm MITRAL VALVE MV Area (PHT): 5.38 cm    SHUNTS MV Decel Time: 141 msec    Systemic VTI:  0.34 m MV E velocity: 79.30 cm/s  Systemic Diam: 2.20 cm MV A velocity: 56.90 cm/s MV E/A ratio:  1.39 Rudean Haskell MD Electronically signed by Rudean Haskell MD Signature Date/Time: 05/13/2022/2:18:19 PM    Final    DG CHEST PORT 1 VIEW  Result Date: 05/13/2022 CLINICAL DATA:  67 year old female presents with respiratory distress. EXAM: PORTABLE CHEST 1 VIEW COMPARISON:  May 09, 2022 FINDINGS: EKG leads project over the chest.  Cardiomediastinal contours and hilar structures are normal. Diffuse interstitial and airspace opacities are similar with slightly diminished lung volumes. No pneumothorax. On limited assessment no acute skeletal findings. IMPRESSION: Similar appearance of the chest accounting for slightly diminished lung volumes with findings of chronic interstitial lung disease. With this pattern it would be difficult to exclude superimposed infection or edema. Electronically Signed   By: Zetta Bills M.D.   On: 05/13/2022 12:32   CT Angio Chest/Abd/Pel for Dissection W and/or Wo Contrast  Result Date: 05/09/2022 CLINICAL DATA:  Chest pain radiating into the neck, initial encounter EXAM: CT ANGIOGRAPHY CHEST, ABDOMEN AND PELVIS TECHNIQUE: Non-contrast CT of the chest was initially obtained. Multidetector CT imaging through the chest, abdomen and pelvis was performed using the standard protocol during bolus administration of intravenous contrast. Multiplanar reconstructed images and MIPs were obtained and reviewed to evaluate the vascular anatomy. RADIATION DOSE REDUCTION: This exam was performed according to the departmental dose-optimization program which includes automated exposure control, adjustment of the mA and/or kV according to patient size and/or use of iterative reconstruction technique. CONTRAST:  175m OMNIPAQUE IOHEXOL 350  MG/ML SOLN COMPARISON:  07/15/2020 FINDINGS: CTA CHEST FINDINGS Cardiovascular: Initial precontrast images demonstrate atherosclerotic calcification of the thoracic aorta. No aneurysmal dilatation is seen. No hyperdense crescent to suggest acute aortic injury is noted. Postcontrast images were then obtained and reveal no evidence of aortic dissection. Heart is not significantly enlarged. The pulmonary artery shows a normal branching pattern bilaterally. No filling defect to suggest pulmonary embolism is noted. Coronary calcifications are noted. Mediastinum/Nodes: Thoracic inlet is within normal  limits. Prominent AP window lymph node has resolved in the interval from the prior exam. Persistent right hilar lymph nodes are seen likely reactive in nature. The esophagus as visualized is within normal limits. Lungs/Pleura: Lungs demonstrate diffuse fibrotic changes and honeycombing particularly in the bases. The previously seen diffuse superimposed infiltrates have resolved. There is a 4 mm nodule in the right upper lobe on image number 26 of series 9. No other sizable nodules are seen. No effusions are noted. Musculoskeletal: Degenerative changes of the thoracic spine are noted. No acute rib abnormality is seen. Chronic compression deformities of T5 and T7 are noted. Review of the MIP images confirms the above findings. CTA ABDOMEN AND PELVIS FINDINGS VASCULAR Aorta: Atherosclerotic calcifications are noted without aneurysmal dilatation or dissection. Celiac: Patent without evidence of aneurysm, dissection, vasculitis or significant stenosis. SMA: Patent without evidence of aneurysm, dissection, vasculitis or significant stenosis. Renals: Both renal arteries are patent without evidence of aneurysm, dissection, vasculitis, fibromuscular dysplasia or significant stenosis. IMA: Patent without evidence of aneurysm, dissection, vasculitis or significant stenosis. Inflow: Iliacs demonstrate mild atherosclerotic calcification although no focal stenosis is seen. Veins: Visualized venous structures appear within normal limits. IVC filter is noted in satisfactory position. Review of the MIP images confirms the above findings. NON-VASCULAR Hepatobiliary: Gallbladder has been surgically removed. Liver shows no focal abnormality. Pancreas: Unremarkable. No pancreatic ductal dilatation or surrounding inflammatory changes. Spleen: Normal in size without focal abnormality. Adrenals/Urinary Tract: Adrenal glands are within normal limits. Kidneys demonstrate a normal enhancement pattern bilaterally. No renal calculi or  obstructive changes are seen. The bladder is partially distended. Stomach/Bowel: Fecal material is noted scattered throughout the colon. No obstructive or inflammatory changes are seen. The appendix is not well visualized and may have been surgically. No inflammatory changes are seen. The small bowel and stomach are unremarkable. Lymphatic: No lymphadenopathy is noted. Reproductive: Status post hysterectomy. No adnexal masses. Other: No abdominal wall hernia or abnormality. No abdominopelvic ascites. Musculoskeletal: Postsurgical changes are noted in the lower lumbar spine. Prior vertebral augmentation at L1 is seen. Chronic L3 compression fracture is noted. Review of the MIP images confirms the above findings. IMPRESSION: CTA of the chest: No evidence of aortic injury. No pulmonary embolism is seen. Persistent right hilar lymph nodes likely reactive in nature. Chronic fibrotic changes are noted in the lungs bilaterally similar to prior exam. 4 mm right solid pulmonary nodule within the upper lobe. Per Fleischner Society Guidelines, a non-contrast Chest CT at 12 months is optional. If performed and the nodule is stable at 12 months, no further follow-up is recommended. These guidelines do not apply to immunocompromised patients and patients with cancer. Follow up in patients with significant comorbidities as clinically warranted. For lung cancer screening, adhere to Lung-RADS guidelines. Reference: Radiology. 2017; 284(1):228-43. CTA of the abdomen and pelvis: No aortic abnormality is noted. No acute abnormality is seen. Electronically Signed   By: Inez Catalina M.D.   On: 05/09/2022 23:40   CT Head Wo Contrast  Result Date: 05/09/2022 CLINICAL DATA:  New onset headache EXAM: CT HEAD WITHOUT CONTRAST TECHNIQUE: Contiguous axial images were obtained from the base of the skull through the vertex without intravenous contrast. RADIATION DOSE REDUCTION: This exam was performed according to the departmental  dose-optimization program which includes automated exposure control, adjustment of the mA and/or kV according to patient size and/or use of iterative reconstruction technique. COMPARISON:  None Available. FINDINGS: Brain: There is no mass, hemorrhage or extra-axial collection. The size and configuration of the ventricles and extra-axial CSF spaces are normal. The brain parenchyma is normal, without acute or chronic infarction. Vascular: No abnormal hyperdensity of the major intracranial arteries or dural venous sinuses. No intracranial atherosclerosis. Skull: The visualized skull base, calvarium and extracranial soft tissues are normal. Sinuses/Orbits: No fluid levels or advanced mucosal thickening of the visualized paranasal sinuses. No mastoid or middle ear effusion. The orbits are normal. IMPRESSION: Normal head CT. Electronically Signed   By: Ulyses Jarred M.D.   On: 05/09/2022 23:22   DG Chest Portable 1 View  Result Date: 05/09/2022 CLINICAL DATA:  Chest pain and shortness of breath EXAM: PORTABLE CHEST 1 VIEW COMPARISON:  07/15/2020 FINDINGS: Cardiac shadow is stable. Diffuse fibrotic changes are identified throughout both lungs. No focal infiltrate or sizable effusion is seen. No bony abnormality is noted. IMPRESSION: Changes consistent with pulmonary fibrosis without acute abnormality. Electronically Signed   By: Inez Catalina M.D.   On: 05/09/2022 22:03    Today   Subjective    [name] Fitzpatrick today has no new complaints  No fever  Or chills   No Nausea, Vomiting or Diarrhea -Cough and shortness of breath is improved significantly         Patient has been seen and examined prior to discharge   Objective   Blood pressure 105/65, pulse 71, temperature 98.3 F (36.8 C), temperature source Oral, resp. rate 15, height '5\' 2"'$  (1.575 m), weight 58.4 kg, SpO2 96 %.   Intake/Output Summary (Last 24 hours) at 05/23/2022 1144 Last data filed at 05/23/2022 0344 Gross per 24 hour  Intake 720 ml   Output --  Net 720 ml    Exam Gen:- Awake Alert, no acute distress , speaking in complete sentences at rest HEENT:- [email or web address], No sclera icterus Nose- [place] 8L/min Neck-Supple Neck,No JVD,.  Lungs-Velcro type rales, fair air movement , some dyspnea on exertion  CV- S1, S2 normal, regular Abd-  +ve B.Sounds, Abd Soft, No tenderness,    Extremity/Skin:- No  edema,   good pulses Psych-affect is appropriate, oriented x3 Neuro-no new focal deficits, no tremors    Data Review   CBC w Diff:  Lab Results  Component Value Date   WBC 20.0 (H) 05/21/2022   HGB 10.2 (L) 05/21/2022   HCT 31.5 (L) 05/21/2022   PLT 178 05/21/2022   LYMPHOPCT 7 05/10/2022   MONOPCT 0 05/10/2022   EOSPCT 0 05/10/2022   BASOPCT 0 05/10/2022    CMP:  Lab Results  Component Value Date   NA 133 (L) 05/21/2022   K 3.9 05/21/2022   CL 94 (L) 05/21/2022   CO2 30 05/21/2022   BUN 14 05/21/2022   CREATININE 0.55 05/21/2022   CREATININE 0.88 06/20/2015   PROT 5.7 (L) 05/13/2022   ALBUMIN 2.9 (L) 05/13/2022   BILITOT 0.4 05/13/2022   ALKPHOS 46 05/13/2022   AST 16 05/13/2022   ALT 15 05/13/2022  .  Total Discharge time is about 33 minutes  Roxan Hockey M.D on 05/23/2022 at 11:44 AM  Go  to [email or web address] -  for contact info  [facility] - Office  [phone]
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
## 2022-05-24 LAB — GLUCOSE, CAPILLARY
Glucose-Capillary: 137 mg/dL — ABNORMAL HIGH (ref 70–99)
Glucose-Capillary: 135 mg/dL — ABNORMAL HIGH (ref 70–99)

## 2022-05-24 NOTE — Care Management Important Message (Signed)
Important Message  Patient Details  Name: [name] Fitzpatrick MRN: [id number] Date of Birth: 02-03-1956   Medicare Important Message Given:  Yes     Tommy Medal 05/24/2022, 10:48 AM

## 2022-05-24 NOTE — Progress Notes (Signed)
Patient received dilaudid once this shift for pain, she slept on and off this shift. Continued to monitor.
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
## 2022-06-18 DEATH — deceased

## 2022-06-21 ENCOUNTER — Inpatient Hospital Stay: Payer: Medicare HMO | Admitting: Pulmonary Disease

## 2024-01-01 ENCOUNTER — Encounter (INDEPENDENT_AMBULATORY_CARE_PROVIDER_SITE_OTHER): Payer: Self-pay | Admitting: Gastroenterology
